# Patient Record
Sex: Male | Born: 1950 | Race: White | Hispanic: No | Marital: Married | Smoking: Never smoker
Health system: Southern US, Community
[De-identification: ages and names within clinical notes are randomized; demographics above are authoritative.]

## PROBLEM LIST (undated history)

## (undated) DIAGNOSIS — Z85828 Personal history of other malignant neoplasm of skin: Secondary | ICD-10-CM

## (undated) DIAGNOSIS — I10 Essential (primary) hypertension: Secondary | ICD-10-CM

## (undated) DIAGNOSIS — C801 Malignant (primary) neoplasm, unspecified: Secondary | ICD-10-CM

## (undated) DIAGNOSIS — G500 Trigeminal neuralgia: Secondary | ICD-10-CM

## (undated) DIAGNOSIS — R011 Cardiac murmur, unspecified: Secondary | ICD-10-CM

## (undated) DIAGNOSIS — E7800 Pure hypercholesterolemia, unspecified: Secondary | ICD-10-CM

## (undated) DIAGNOSIS — I499 Cardiac arrhythmia, unspecified: Secondary | ICD-10-CM

## (undated) DIAGNOSIS — I4891 Unspecified atrial fibrillation: Secondary | ICD-10-CM

## (undated) HISTORY — DX: Essential (primary) hypertension: I10

## (undated) HISTORY — PX: SKIN CANCER EXCISION: SHX779

## (undated) HISTORY — DX: Pure hypercholesterolemia, unspecified: E78.00

## (undated) HISTORY — DX: Trigeminal neuralgia: G50.0

## (undated) HISTORY — DX: Personal history of other malignant neoplasm of skin: Z85.828

## (undated) HISTORY — PX: CATARACT EXTRACTION W/ INTRAOCULAR LENS IMPLANT: SHX1309

---

## 1995-03-07 HISTORY — PX: RETINAL DETACHMENT SURGERY: SHX105

## 2008-02-26 ENCOUNTER — Encounter: Admission: RE | Admit: 2008-02-26 | Discharge: 2008-02-26 | Payer: Self-pay | Admitting: Sports Medicine

## 2011-08-23 ENCOUNTER — Encounter (INDEPENDENT_AMBULATORY_CARE_PROVIDER_SITE_OTHER): Payer: Self-pay | Admitting: General Surgery

## 2011-09-20 ENCOUNTER — Encounter (INDEPENDENT_AMBULATORY_CARE_PROVIDER_SITE_OTHER): Payer: Self-pay | Admitting: General Surgery

## 2011-09-20 ENCOUNTER — Ambulatory Visit (INDEPENDENT_AMBULATORY_CARE_PROVIDER_SITE_OTHER): Payer: 59 | Admitting: General Surgery

## 2011-09-20 VITALS — BP 128/82 | HR 60 | Temp 98.4°F | Resp 14 | Ht 74.0 in | Wt 169.0 lb

## 2011-09-20 DIAGNOSIS — K4090 Unilateral inguinal hernia, without obstruction or gangrene, not specified as recurrent: Secondary | ICD-10-CM

## 2011-09-20 NOTE — Progress Notes (Signed)
Patient ID: James Ibarra, male   DOB: 14-Feb-1951, 61 y.o.   MRN: [id number]  Chief Complaint  Patient presents with  . Inguinal Hernia    left    HPI James Ibarra is a 61 y.o. male.   HPI 61 year old Caucasian male referred by Dr. Wynelle Link for evaluation of a left inguinal hernia. The patient states that he started having some mild discomfort in his left groin area about 2 months ago. He states the area is more bothersome toward the end of the day after he's been standing. He also noticed a bulge later in the afternoon. He denies any burning or stinging pain. He denies any fever, chills, nausea, vomiting, diarrhea or constipation. He states the area just causes a mild ache or discomfort.  Past Medical History  Diagnosis Date  . Hypertension   . Trigeminal neuralgia   . Hypercholesteremia   . History of nonmelanoma skin cancer     Past Surgical History  Procedure Date  . Skin cancer excision 2012, 1999    basal cell - neck and hand  . Retinal detachment surgery 1997    Family History  Problem Relation Age of Onset  . Heart disease Father   . Heart disease Mother     Social History History  Substance Use Topics  . Smoking status: Never Smoker   . Smokeless tobacco: Not on file  . Alcohol Use: Yes     3 glasses of wine a day    No Known Allergies  Current Outpatient Prescriptions  Medication Sig Dispense Refill  . sildenafil (REVATIO) 20 MG tablet Take 20 mg by mouth as needed.      Marland Kitchen amLODipine (NORVASC) 5 MG tablet Take 5 mg by mouth daily.      Marland Kitchen olmesartan-hydrochlorothiazide (BENICAR HCT) 40-25 MG per tablet Take 1 tablet by mouth daily.      Marland Kitchen zolpidem (AMBIEN CR) 6.25 MG CR tablet Take 6.25 mg by mouth at bedtime as needed.        Review of Systems Review of Systems  Constitutional: Negative for fever, chills, appetite change and unexpected weight change.  HENT: Negative for congestion and trouble swallowing.   Eyes: Negative for visual disturbance.    Respiratory: Negative for chest tightness and shortness of breath.   Cardiovascular: Negative for chest pain and leg swelling.       No PND, no orthopnea, no DOE  Gastrointestinal:       See HPI  Genitourinary: Negative for dysuria, urgency, hematuria, decreased urine volume and difficulty urinating.  Musculoskeletal: Negative.   Skin: Negative for rash.  Neurological: Negative for seizures and speech difficulty.  Hematological: Does not bruise/bleed easily.  Psychiatric/Behavioral: Negative for behavioral problems and confusion.    Blood pressure 128/82, pulse 60, temperature 98.4 F (36.9 C), temperature source Temporal, resp. rate 14, height 6\' 2"  (1.88 James Ibarra), weight 169 lb (76.658 kg).  Physical Exam Physical Exam  Vitals reviewed. Constitutional: He is oriented to person, place, and time. He appears well-developed and well-nourished. No distress.  HENT:  Head: Normocephalic and atraumatic.  Right Ear: External ear normal.  Left Ear: External ear normal.  Eyes: Conjunctivae are normal. No scleral icterus.  Neck: Normal range of motion. Neck supple. No tracheal deviation present. No thyromegaly present.  Cardiovascular: Normal rate, regular rhythm and normal heart sounds.   Pulmonary/Chest: Effort normal and breath sounds normal. No respiratory distress. He has no wheezes.  Abdominal: Soft. Bowel sounds are normal. He exhibits no  distension. There is no tenderness. A hernia is present. Hernia confirmed positive in the left inguinal area. Hernia confirmed negative in the right inguinal area.  Genitourinary: Testes normal and penis normal.          Reducible LIH  Musculoskeletal: Normal range of motion. He exhibits no edema and no tenderness.  Neurological: He is alert and oriented to person, place, and time. He exhibits normal muscle tone.  Skin: Skin is warm and dry. He is not diaphoretic.  Psychiatric: He has a normal mood and affect. His behavior is normal. Judgment and  thought content normal.    Data Reviewed Dr Chase Caller note from 6/14  Assessment    Left inguinal hernia    Plan    We discussed the etiology of inguinal hernias. We discussed the signs & symptoms of incarceration & strangulation.  We discussed non-operative and operative management. We discussed open as well as laparoscopic repairs. We discussed the pros and cons of each.  The patient has elected to proceed with LAPAROSCOPIC REPAIR OF LEFT INGUINAL HERNIA WITH MESH    I described the procedure in detail.  The patient was given educational material. We discussed the risks and benefits including but not limited to bleeding, infection, chronic inguinal pain, nerve entrapment, hernia recurrence, mesh complications, hematoma formation, urinary retention, injury to the testicles, numbness in the groin, blood clots, injury to the surrounding structures, and anesthesia risk. We also discussed the typical post operative recovery course, including no heavy lifting for 4-6 weeks. I explained that the likelihood of improvement of their symptoms is good.  All of his questions were asked and answered  Mary Sella. Andrey Campanile, MD, FACS General, Bariatric, & Minimally Invasive Surgery [facility], [place]         [facility] James Ibarra 09/20/2011, 1:05 PM

## 2011-10-27 ENCOUNTER — Encounter (HOSPITAL_COMMUNITY): Payer: Self-pay | Admitting: Pharmacy Technician

## 2011-11-03 ENCOUNTER — Encounter (HOSPITAL_COMMUNITY)
Admission: RE | Admit: 2011-11-03 | Discharge: 2011-11-03 | Disposition: A | Discharge status: Home / Self Care | Payer: 59 | Source: Ambulatory Visit | Attending: General Surgery | Admitting: General Surgery

## 2011-11-03 ENCOUNTER — Ambulatory Visit (HOSPITAL_COMMUNITY)
Admission: RE | Admit: 2011-11-03 | Discharge: 2011-11-03 | Disposition: A | Discharge status: Home / Self Care | Payer: 59 | Source: Ambulatory Visit | Attending: General Surgery | Admitting: General Surgery

## 2011-11-03 ENCOUNTER — Encounter (HOSPITAL_COMMUNITY): Payer: Self-pay

## 2011-11-03 DIAGNOSIS — J4489 Other specified chronic obstructive pulmonary disease: Secondary | ICD-10-CM | POA: Insufficient documentation

## 2011-11-03 DIAGNOSIS — Z01810 Encounter for preprocedural cardiovascular examination: Secondary | ICD-10-CM | POA: Insufficient documentation

## 2011-11-03 DIAGNOSIS — Z01812 Encounter for preprocedural laboratory examination: Secondary | ICD-10-CM | POA: Insufficient documentation

## 2011-11-03 DIAGNOSIS — K4090 Unilateral inguinal hernia, without obstruction or gangrene, not specified as recurrent: Secondary | ICD-10-CM | POA: Insufficient documentation

## 2011-11-03 DIAGNOSIS — J449 Chronic obstructive pulmonary disease, unspecified: Secondary | ICD-10-CM | POA: Insufficient documentation

## 2011-11-03 LAB — BASIC METABOLIC PANEL
Calcium: 9.9 mg/dL (ref 8.4–10.5)
GFR calc Af Amer: >90 mL/min (ref >90)
GFR calc non Af Amer: >90 mL/min (ref >90)
Glucose, Bld: 61 mg/dL — ABNORMAL LOW (ref 70–99)
Sodium: 140 mEq/L (ref 135–145)

## 2011-11-03 LAB — CBC WITH DIFFERENTIAL/PLATELET
Basophils Absolute: 0 10*3/uL (ref 0.0–0.1)
Basophils Relative: 0 % (ref 0–1)
Eosinophils Absolute: 0.1 10*3/uL (ref 0.0–0.7)
Eosinophils Relative: 1 % (ref 0–5)
Lymphs Abs: 1.7 10*3/uL (ref 0.7–4.0)
MCH: 31 pg (ref 26.0–34.0)
MCHC: 34.5 g/dL (ref 30.0–36.0)
MCV: 89.9 fL (ref 78.0–100.0)
Neutrophils Relative %: 53 % (ref 43–77)
Platelets: 241 10*3/uL (ref 150–400)
RDW: 13.3 % (ref 11.5–15.5)

## 2011-11-03 LAB — SURGICAL PCR SCREEN: Staphylococcus aureus: POSITIVE — AB

## 2011-11-03 NOTE — Patient Instructions (Addendum)
20 REFUGIO VANDEVOORDE  11/03/2011   Your procedure is scheduled on:  11-07-2011   Report to [facility] at 0530  AM.  Call this number if you have problems the morning of surgery: [phone]   Remember:wife holli driver and to be with you 24 hours after surgery cell [phone]   Do not eat food or drink liquids:After Midnight.  .  Take these medicines the morning of surgery with A SIP OF WATER: amlodipine   Do not wear jewelry or make up.  Do not wear lotions, powders, or perfumes.Do not wear deodorant.    Do not bring valuables to the hospital.  Contacts, dentures or bridgework may not be worn into surgery.  Leave suitcase in the car. After surgery it may be brought to your room.  For patients admitted to the hospital, checkout time is 11:00 AM the day of discharge                             Patients discharged the day of surgery will not be allowed to drive home. If going home same day of surgery, you must have someone stay with you the first 24 hours at home and arrange for some one to drive you home from hospital.    Special Instructions: CHG Shower Use Special Wash: 1/2 bottle night before surgery and 1/2 bottle morning  of surgery, use regular soap on face and front and back private area. Women do not shave legs or underarms for 2 days before showers. Men may shave face morning of surgery.    Please read over the following fact sheets that you were given: MRSA Information  Cain S[facility] pre op nurse phone number [phone], call if needed

## 2011-11-03 NOTE — Progress Notes (Addendum)
bmet results routed to dr Andrey Campanile inbox, pt stated by phone feeling well, ate only small meal prior to pre op this am Routed chest xray results to dr Andrey Campanile inbox

## 2011-11-07 ENCOUNTER — Ambulatory Visit (HOSPITAL_COMMUNITY)
Admission: RE | Admit: 2011-11-07 | Discharge: 2011-11-07 | Disposition: A | Discharge status: Home / Self Care | Payer: 59 | Source: Ambulatory Visit | Attending: General Surgery | Admitting: General Surgery

## 2011-11-07 ENCOUNTER — Encounter (HOSPITAL_COMMUNITY): Payer: Self-pay | Admitting: *Deleted

## 2011-11-07 ENCOUNTER — Encounter (INDEPENDENT_AMBULATORY_CARE_PROVIDER_SITE_OTHER): Payer: Self-pay | Admitting: General Surgery

## 2011-11-07 ENCOUNTER — Ambulatory Visit (HOSPITAL_COMMUNITY): Payer: 59 | Admitting: *Deleted

## 2011-11-07 ENCOUNTER — Encounter (HOSPITAL_COMMUNITY)
Admission: RE | Disposition: A | Discharge status: Home / Self Care | Payer: Self-pay | Source: Ambulatory Visit | Attending: General Surgery

## 2011-11-07 DIAGNOSIS — E7800 Pure hypercholesterolemia, unspecified: Secondary | ICD-10-CM | POA: Insufficient documentation

## 2011-11-07 DIAGNOSIS — K4090 Unilateral inguinal hernia, without obstruction or gangrene, not specified as recurrent: Secondary | ICD-10-CM | POA: Insufficient documentation

## 2011-11-07 DIAGNOSIS — I10 Essential (primary) hypertension: Secondary | ICD-10-CM | POA: Insufficient documentation

## 2011-11-07 DIAGNOSIS — R911 Solitary pulmonary nodule: Secondary | ICD-10-CM | POA: Insufficient documentation

## 2011-11-07 DIAGNOSIS — Z79899 Other long term (current) drug therapy: Secondary | ICD-10-CM | POA: Insufficient documentation

## 2011-11-07 DIAGNOSIS — Z85828 Personal history of other malignant neoplasm of skin: Secondary | ICD-10-CM | POA: Insufficient documentation

## 2011-11-07 HISTORY — PX: HERNIA REPAIR: SHX51

## 2011-11-07 HISTORY — PX: INGUINAL HERNIA REPAIR: SHX194

## 2011-11-07 SURGERY — REPAIR, HERNIA, INGUINAL, LAPAROSCOPIC
Anesthesia: General | Site: Pelvis | Laterality: Left | Wound class: Clean

## 2011-11-07 MED ORDER — GLYCOPYRROLATE 0.2 MG/ML IJ SOLN
INTRAMUSCULAR | Status: DC | PRN
  Administered 2011-11-07: 0.6 mg via INTRAVENOUS

## 2011-11-07 MED ORDER — BUPIVACAINE-EPINEPHRINE PF 0.25-1:200000 % IJ SOLN
INTRAMUSCULAR | Status: AC
  Filled 2011-11-07: qty 30

## 2011-11-07 MED ORDER — MORPHINE SULFATE 10 MG/ML IJ SOLN: 1.0000 mg | INTRAMUSCULAR | Status: DC | PRN

## 2011-11-07 MED ORDER — FENTANYL CITRATE 0.05 MG/ML IJ SOLN: 25.0000 ug | INTRAMUSCULAR | Status: DC | PRN

## 2011-11-07 MED ORDER — ONDANSETRON HCL 4 MG/2ML IJ SOLN: 4.0000 mg | Freq: Four times a day (QID) | INTRAMUSCULAR | Status: DC | PRN

## 2011-11-07 MED ORDER — OXYCODONE HCL 5 MG PO TABS
5.0000 mg | ORAL_TABLET | ORAL | Status: DC | PRN
  Administered 2011-11-07: 5 mg via ORAL

## 2011-11-07 MED ORDER — ONDANSETRON HCL 4 MG/2ML IJ SOLN
INTRAMUSCULAR | Status: DC | PRN
  Administered 2011-11-07: 4 mg via INTRAVENOUS

## 2011-11-07 MED ORDER — 0.9 % SODIUM CHLORIDE (POUR BTL) OPTIME
TOPICAL | Status: DC | PRN
  Administered 2011-11-07: 1000 mL

## 2011-11-07 MED ORDER — ACETAMINOPHEN 10 MG/ML IV SOLN
INTRAVENOUS | Status: DC | PRN
  Administered 2011-11-07: 1000 mg via INTRAVENOUS

## 2011-11-07 MED ORDER — KETOROLAC TROMETHAMINE 30 MG/ML IJ SOLN
15.0000 mg | Freq: Once | INTRAMUSCULAR | Status: AC | PRN
  Administered 2011-11-07: 30 mg via INTRAVENOUS

## 2011-11-07 MED ORDER — EPHEDRINE SULFATE 50 MG/ML IJ SOLN
INTRAMUSCULAR | Status: DC | PRN
  Administered 2011-11-07 (×3): 5 mg via INTRAVENOUS
  Administered 2011-11-07 (×2): 10 mg via INTRAVENOUS

## 2011-11-07 MED ORDER — HYDROMORPHONE HCL PF 1 MG/ML IJ SOLN
INTRAMUSCULAR | Status: DC | PRN
  Administered 2011-11-07 (×2): 1 mg via INTRAVENOUS

## 2011-11-07 MED ORDER — CEFAZOLIN SODIUM-DEXTROSE 2-3 GM-% IV SOLR
2.0000 g | INTRAVENOUS | Status: AC
  Administered 2011-11-07: 2 g via INTRAVENOUS

## 2011-11-07 MED ORDER — MIDAZOLAM HCL 5 MG/5ML IJ SOLN
INTRAMUSCULAR | Status: DC | PRN
  Administered 2011-11-07 (×2): 1 mg via INTRAVENOUS

## 2011-11-07 MED ORDER — SODIUM CHLORIDE 0.9 % IJ SOLN: 3.0000 mL | INTRAMUSCULAR | Status: DC | PRN

## 2011-11-07 MED ORDER — KETAMINE HCL 10 MG/ML IJ SOLN
INTRAMUSCULAR | Status: DC | PRN
  Administered 2011-11-07 (×5): 1 mg via INTRAVENOUS
  Administered 2011-11-07: 25 mg via INTRAVENOUS

## 2011-11-07 MED ORDER — LACTATED RINGERS IV SOLN
INTRAVENOUS | Status: DC | PRN
  Administered 2011-11-07: 1000 mL

## 2011-11-07 MED ORDER — LACTATED RINGERS IV SOLN
INTRAVENOUS | Status: DC | PRN
  Administered 2011-11-07 (×2): via INTRAVENOUS

## 2011-11-07 MED ORDER — METOCLOPRAMIDE HCL 5 MG/ML IJ SOLN
INTRAMUSCULAR | Status: DC | PRN
  Administered 2011-11-07: 10 mg via INTRAVENOUS

## 2011-11-07 MED ORDER — DEXAMETHASONE SODIUM PHOSPHATE 4 MG/ML IJ SOLN
INTRAMUSCULAR | Status: DC | PRN
  Administered 2011-11-07: 10 mg via INTRAVENOUS

## 2011-11-07 MED ORDER — CEFAZOLIN SODIUM-DEXTROSE 2-3 GM-% IV SOLR
INTRAVENOUS | Status: AC
Start: 2011-11-07 — End: 2011-11-07
  Filled 2011-11-07: qty 50

## 2011-11-07 MED ORDER — PROMETHAZINE HCL 25 MG/ML IJ SOLN: 6.2500 mg | INTRAMUSCULAR | Status: DC | PRN

## 2011-11-07 MED ORDER — SODIUM CHLORIDE 0.9 % IJ SOLN: 3.0000 mL | Freq: Two times a day (BID) | INTRAMUSCULAR | Status: DC

## 2011-11-07 MED ORDER — OXYCODONE-ACETAMINOPHEN 5-325 MG PO TABS: 1.0000 | ORAL_TABLET | ORAL | Status: AC | PRN

## 2011-11-07 MED ORDER — KETOROLAC TROMETHAMINE 30 MG/ML IJ SOLN
INTRAMUSCULAR | Status: AC
  Filled 2011-11-07: qty 1

## 2011-11-07 MED ORDER — ROCURONIUM BROMIDE 100 MG/10ML IV SOLN
INTRAVENOUS | Status: DC | PRN
  Administered 2011-11-07: 50 mg via INTRAVENOUS

## 2011-11-07 MED ORDER — OXYCODONE HCL 5 MG PO TABS
ORAL_TABLET | ORAL | Status: AC
  Administered 2011-11-07: 5 mg via ORAL
  Filled 2011-11-07: qty 1

## 2011-11-07 MED ORDER — SODIUM CHLORIDE 0.9 % IV SOLN: 250.0000 mL | INTRAVENOUS | Status: DC | PRN

## 2011-11-07 MED ORDER — BUPIVACAINE-EPINEPHRINE 0.25% -1:200000 IJ SOLN
INTRAMUSCULAR | Status: DC | PRN
  Administered 2011-11-07: 30 mL

## 2011-11-07 MED ORDER — ACETAMINOPHEN 325 MG PO TABS: 650.0000 mg | ORAL_TABLET | ORAL | Status: DC | PRN

## 2011-11-07 MED ORDER — BUPIVACAINE-EPINEPHRINE PF 0.25-1:200000 % IJ SOLN
INTRAMUSCULAR | Status: AC
Start: 2011-11-07 — End: 2011-11-07
  Filled 2011-11-07: qty 30

## 2011-11-07 MED ORDER — ACETAMINOPHEN 650 MG RE SUPP
650.0000 mg | RECTAL | Status: DC | PRN
  Filled 2011-11-07: qty 1

## 2011-11-07 MED ORDER — FENTANYL CITRATE 0.05 MG/ML IJ SOLN
INTRAMUSCULAR | Status: DC | PRN
  Administered 2011-11-07 (×2): 50 ug via INTRAVENOUS

## 2011-11-07 MED ORDER — NEOSTIGMINE METHYLSULFATE 1 MG/ML IJ SOLN
INTRAMUSCULAR | Status: DC | PRN
  Administered 2011-11-07: 5 mg via INTRAVENOUS

## 2011-11-07 MED ORDER — ACETAMINOPHEN 10 MG/ML IV SOLN
INTRAVENOUS | Status: AC
  Filled 2011-11-07: qty 100

## 2011-11-07 MED ORDER — PROPOFOL 10 MG/ML IV BOLUS
INTRAVENOUS | Status: DC | PRN
  Administered 2011-11-07: 150 mg via INTRAVENOUS

## 2011-11-07 SURGICAL SUPPLY — 41 items
ADH SKN CLS APL DERMABOND .7 (GAUZE/BANDAGES/DRESSINGS) ×1
APL SKNCLS STERI-STRIP NONHPOA (GAUZE/BANDAGES/DRESSINGS)
BANDAGE ADHESIVE 1X3 (GAUZE/BANDAGES/DRESSINGS) IMPLANT
BENZOIN TINCTURE PRP APPL 2/3 (GAUZE/BANDAGES/DRESSINGS) IMPLANT
CANISTER SUCTION 2500CC (MISCELLANEOUS) ×1 IMPLANT
CLOTH BEACON ORANGE TIMEOUT ST (SAFETY) ×2 IMPLANT
DECANTER SPIKE VIAL GLASS SM (MISCELLANEOUS) ×2 IMPLANT
DERMABOND ADVANCED (GAUZE/BANDAGES/DRESSINGS) ×1
DERMABOND ADVANCED .7 DNX12 (GAUZE/BANDAGES/DRESSINGS) IMPLANT
DEVICE SECURE STRAP 25 ABSORB (INSTRUMENTS) ×2 IMPLANT
DRAPE LAPAROSCOPIC ABDOMINAL (DRAPES) ×2 IMPLANT
DRAPE UTILITY XL STRL (DRAPES) ×2 IMPLANT
DRSG TEGADERM 2-3/8X2-3/4 SM (GAUZE/BANDAGES/DRESSINGS) IMPLANT
DRSG TEGADERM 4X4.75 (GAUZE/BANDAGES/DRESSINGS) IMPLANT
ELECT REM PT RETURN 9FT ADLT (ELECTROSURGICAL) ×2
ELECTRODE REM PT RTRN 9FT ADLT (ELECTROSURGICAL) ×1 IMPLANT
GLOVE BIO SURGEON STRL SZ7.5 (GLOVE) ×2 IMPLANT
GLOVE BIOGEL M STRL SZ7.5 (GLOVE) IMPLANT
GLOVE BIOGEL PI IND STRL 7.5 (GLOVE) IMPLANT
GLOVE BIOGEL PI INDICATOR 7.5 (GLOVE) ×1
GLOVE ECLIPSE 7.0 STRL STRAW (GLOVE) ×1 IMPLANT
GLOVE INDICATOR 8.0 STRL GRN (GLOVE) ×2 IMPLANT
GLOVE SURG SS PI 6.5 STRL IVOR (GLOVE) ×2 IMPLANT
GLOVE SURG SS PI 7.0 STRL IVOR (GLOVE) ×1 IMPLANT
GOWN STRL NON-REIN LRG LVL3 (GOWN DISPOSABLE) ×2 IMPLANT
GOWN STRL REIN XL XLG (GOWN DISPOSABLE) ×4 IMPLANT
KIT BASIN OR (CUSTOM PROCEDURE TRAY) ×2 IMPLANT
MESH ULTRAPRO 3X6 7.6X15CM (Mesh General) ×1 IMPLANT
NS IRRIG 1000ML POUR BTL (IV SOLUTION) ×2 IMPLANT
SCISSORS LAP 5X35 DISP (ENDOMECHANICALS) ×1 IMPLANT
SET IRRIG TUBING LAPAROSCOPIC (IRRIGATION / IRRIGATOR) ×1 IMPLANT
SHEARS CURVED HARMONIC AC 45CM (MISCELLANEOUS) IMPLANT
SOLUTION ANTI FOG 6CC (MISCELLANEOUS) ×2 IMPLANT
STRIP CLOSURE SKIN 1/2X4 (GAUZE/BANDAGES/DRESSINGS) IMPLANT
SUT MNCRL AB 4-0 PS2 18 (SUTURE) ×2 IMPLANT
SUT VICRYL 0 UR6 27IN ABS (SUTURE) ×1 IMPLANT
TOWEL OR 17X26 10 PK STRL BLUE (TOWEL DISPOSABLE) ×2 IMPLANT
TRAY LAP CHOLE (CUSTOM PROCEDURE TRAY) ×2 IMPLANT
TROCAR BLADELESS OPT 5 75 (ENDOMECHANICALS) ×4 IMPLANT
TROCAR XCEL BLUNT TIP 100MML (ENDOMECHANICALS) ×2 IMPLANT
TUBING INSUFFLATION 10FT LAP (TUBING) ×2 IMPLANT

## 2011-11-07 NOTE — H&P (Signed)
James Ibarra is an 61 y.o. male.   Chief Complaint: here for surgery HPI: 61 year old Caucasian male referred by Dr. Wynelle Link for evaluation of a left inguinal hernia. The patient states that he started having some mild discomfort in his left groin area about 2 months ago. He states the area is more bothersome toward the end of the day after he's been standing. He also noticed a bulge later in the afternoon. He denies any burning or stinging pain. He denies any fever, chills, nausea, vomiting, diarrhea or constipation. He states the area just causes a mild ache or discomfort.  Since seen in office - reports increased discomfort.   Past Medical History  Diagnosis Date  . Hypertension   . Hypercholesteremia   . History of nonmelanoma skin cancer   . Trigeminal neuralgia     Past Surgical History  Procedure Date  . Skin cancer excision 2012, 1999    basal cell - neck and hand  . Retinal detachment surgery 1997    Family History  Problem Relation Age of Onset  . Heart disease Father   . Heart disease Mother    Social History:  reports that he has never smoked. He has never used smokeless tobacco. He reports that he drinks alcohol. He reports that he does not use illicit drugs.  Allergies: No Known Allergies  Medications Prior to Admission  Medication Sig Dispense Refill  . amLODipine (NORVASC) 5 MG tablet Take 5 mg by mouth daily before breakfast.       . Multiple Vitamin (MULTIVITAMIN WITH MINERALS) TABS Take 1 tablet by mouth daily.      Marland Kitchen olmesartan-hydrochlorothiazide (BENICAR HCT) 40-25 MG per tablet Take 1 tablet by mouth daily before breakfast.       . POTASSIUM GLUCONATE PO Take 1 tablet by mouth daily.      Marland Kitchen zolpidem (AMBIEN CR) 6.25 MG CR tablet Take 12.5 mg by mouth at bedtime as needed. Sleep      . sildenafil (VIAGRA) 100 MG tablet Take 100 mg by mouth daily as needed. Erectile dysfunction        No results found for this or any previous visit (from the past 48  hour(s)). No results found.  Review of Systems  Constitutional: Negative for fever and chills.  Respiratory: Negative for shortness of breath.   Cardiovascular: Negative for chest pain, leg swelling and PND.  Gastrointestinal: Negative for nausea and vomiting.  Neurological: Negative for seizures and loss of consciousness.  All other systems reviewed and are negative.    Blood pressure 126/85, pulse 67, temperature 97.2 F (36.2 C), resp. rate 20, SpO2 98.00%. Physical Exam  Vitals reviewed. Constitutional: He is oriented to person, place, and time. He appears well-developed and well-nourished. No distress.  HENT:  Head: Normocephalic and atraumatic.  Right Ear: External ear normal.  Left Ear: External ear normal.  Eyes: Conjunctivae are normal.  Neck: Neck supple. No tracheal deviation present.  Cardiovascular: Normal rate and intact distal pulses.   Respiratory: Effort normal. No stridor. He has no wheezes.  GI: Soft. Bowel sounds are normal. He exhibits no distension.  Musculoskeletal: He exhibits no edema.  Neurological: He is alert and oriented to person, place, and time.  Skin: Skin is warm and dry. He is not diaphoretic.  Psychiatric: He has a normal mood and affect. His behavior is normal. Judgment and thought content normal.     Assessment/Plan Left inguinal hernia  To OR for repair - all questions asked  and answered Mary Sella. Andrey Campanile, MD, FACS General, Bariatric, & Minimally Invasive Surgery [facility], [place]   [facility] M 11/07/2011, 7:25 AM

## 2011-11-07 NOTE — Anesthesia Postprocedure Evaluation (Signed)
  Anesthesia Post-op Note  Patient: James Ibarra  Procedure(s) Performed: Procedure(s) (LRB): LAPAROSCOPIC INGUINAL HERNIA (Left) INSERTION OF MESH (Left)  Patient Location: PACU  Anesthesia Type: General  Level of Consciousness: awake and alert   Airway and Oxygen Therapy: Patient Spontanous Breathing  Post-op Pain: mild  Post-op Assessment: Post-op Vital signs reviewed, Patient's Cardiovascular Status Stable, Respiratory Function Stable, Patent Airway and No signs of Nausea or vomiting  Post-op Vital Signs: stable  Complications: No apparent anesthesia complications

## 2011-11-07 NOTE — Transfer of Care (Signed)
Immediate Anesthesia Transfer of Care Note  Patient: James Ibarra  Procedure(s) Performed: Procedure(s) (LRB): LAPAROSCOPIC INGUINAL HERNIA (Left) INSERTION OF MESH (Left)  Patient Location: PACU  Anesthesia Type: General  Level of Consciousness: awake, alert , patient cooperative and responds to stimulation  Airway & Oxygen Therapy: Patient Spontanous Breathing and Patient connected to face mask oxygen  Post-op Assessment: Report given to PACU RN, Post -op Vital signs reviewed and stable and Patient moving all extremities  Post vital signs: Reviewed and stable  Complications: No apparent anesthesia complications

## 2011-11-07 NOTE — Preoperative (Signed)
Beta Blockers   Reason not to administer Beta Blockers:Not Applicable, NOT ON HOME BB

## 2011-11-07 NOTE — Anesthesia Preprocedure Evaluation (Signed)
Anesthesia Evaluation  Patient identified by MRN, date of birth, ID band Patient awake    Reviewed: Allergy & Precautions, H&P , NPO status , Patient's Chart, lab work & pertinent test results  Airway Mallampati: II TM Distance: >3 FB Neck ROM: Full    Dental No notable dental hx.    Pulmonary neg pulmonary ROS,  breath sounds clear to auscultation  Pulmonary exam normal       Cardiovascular hypertension, Pt. on medications Rhythm:Regular Rate:Normal     Neuro/Psych negative neurological ROS  negative psych ROS   GI/Hepatic negative GI ROS, Neg liver ROS,   Endo/Other  negative endocrine ROS  Renal/GU negative Renal ROS  negative genitourinary   Musculoskeletal negative musculoskeletal ROS (+)   Abdominal   Peds negative pediatric ROS (+)  Hematology negative hematology ROS (+)   Anesthesia Other Findings   Reproductive/Obstetrics negative OB ROS                           Anesthesia Physical Anesthesia Plan  ASA: II  Anesthesia Plan: General   Post-op Pain Management:    Induction: Intravenous  Airway Management Planned: Oral ETT  Additional Equipment:   Intra-op Plan:   Post-operative Plan: Extubation in OR  Informed Consent: I have reviewed the patients History and Physical, chart, labs and discussed the procedure including the risks, benefits and alternatives for the proposed anesthesia with the patient or authorized representative who has indicated his/her understanding and acceptance.   Dental advisory given  Plan Discussed with: CRNA and Surgeon  Anesthesia Plan Comments:         Anesthesia Quick Evaluation  

## 2011-11-07 NOTE — Anesthesia Procedure Notes (Signed)
Procedure Name: Intubation Date/Time: 11/07/2011 7:38 AM Performed by: Randon Goldsmith CATHERINE PAYNE Pre-anesthesia Checklist: Patient identified, Suction available, Emergency Drugs available and Patient being monitored Patient Re-evaluated:Patient Re-evaluated prior to inductionOxygen Delivery Method: Circle system utilized Preoxygenation: Pre-oxygenation with 100% oxygen Intubation Type: IV induction Ventilation: Mask ventilation without difficulty Laryngoscope Size: Mac and 4 Grade View: Grade I Tube type: Oral Tube size: 7.5 mm Number of attempts: 1 Airway Equipment and Method: Stylet Placement Confirmation: ETT inserted through vocal cords under direct vision,  positive ETCO2 and CO2 detector Secured at: 22 cm Tube secured with: Tape Dental Injury: Teeth and Oropharynx as per pre-operative assessment

## 2011-11-07 NOTE — Op Note (Signed)
11/07/2011  James Ibarra 25-Feb-1960   PREOPERATIVE DIAGNOSIS: left inguinal hernia.   POSTOPERATIVE DIAGNOSIS: left indirect inguinal hernia.   PROCEDURE: Laparoscopic repair of left indirect inguinal hernia with  mesh (TAPP).   SURGEON: Mary Sella. Andrey Campanile, MD, FACS  ASSISTANT SURGEON: None.   ANESTHESIA: General plus local consisting of 0.25% Marcaine with epi.   ESTIMATED BLOOD LOSS: Minimal.   FINDINGS: The patient had a left indirect hernia.  It was repaired using a 3 inch x 6  inch piece of Ethicon UltraPro mesh.   SPECIMEN: none  INDICATIONS FOR PROCEDURE: 61 yo WM with a symptomatic left inguinal hernia presents for repair. The risks and benefits including but not limited to bleeding, infection, chronic inguinal pain, nerve entrapment, hernia recurrence, mesh complications, hematoma formation, urinary retention, injury to the testicles or the ovaries, numbness in the groin, blood clots, injury to the surrounding structures, and anesthesia risk was discussed with the patient.  DESCRIPTION OF PROCEDURE: After obtaining verbal consent and marking  the left groin in the holding area with the patient confirming the  operative site, the patient was then taken back to the operating room, placed  supine on the operating room table. General endotracheal anesthesia was  established. The patient had emptied their bladder prior to going back to  the operating room. Sequential compression devices were placed. The  abdomen and groin were prepped and draped in the usual standard surgical  fashion with ChloraPrep. The patient received IV Tylenol as well as IV  antibiotics prior to the incision. A surgical time-out was performed.  Local was infiltrated at the base of the umbilicus.   Next, a 1-cm vertical infraumbilical incision was made with a #11 blade. The fascia  was grasped and lifted anteriorly. Next, the fascia was incised, and  the abdominal cavity was entered. Pursestring  suture was placed around  the fascial edges using a 0 Vicryl. A 12-mm Hasson trocar was placed.  Pneumoperitoneum was smoothly established up to a patient pressure of 15  mmHg. Laparoscope was advanced. There was no evidence of a  contralateral hernia. The patient had a defect lateral to  the inferior epigastric vessel, consistent with an left indirect  hernia. Two 5-mm trocars were placed, one on the right, one on the left  in the midclavicular line slightly above the level of the umbilicus all  under direct visualization. After local had been infiltrated, I then  made incision along the peritoneum on the left, starting 2 inches above  the anterior superior iliac spine and caring it medial  toward the median umbilical ligament in a lazy S configuration using  Endo Shears with electrocautery. The peritoneal flap was then gently  dissected downward from the anterior abdominal wall taking care not to  injure the inferior epigastric vessels. The pubic bone was identified.  The testicular vessels were identified.  Using  traction and counter traction with short graspers, I reduced the sac in  its entirety. The testicular vessels had been identified and preserved. The vas deferens was identified and preserved, and the hernia sac was stripped from those to  surrounding structures. I then went about creating a large pocket by  lifting the peritoneum of the pelvic floor. I took great care not to  injure the iliac vessels.   Local anesthetic was injected 2 finger breadths below and medial to the anterior superior iliac spine as well as along the left groin prior to placing the mesh. I then obtained a piece of  Ethicon UltraPro mesh 3 inch x  6 inch, placed it through the Hasson trocar, half of it covered medial  to the inferior epigastric vessels and half of it lateral to the  inferior epigastric vessels. The defect was well  covered with the mesh. I then secured the mesh to the abdominal wall    using an Ethicon secure strap tack. Tacks were placed through  the Cooper's ligament, one tack on each side of the inferior epigastric  vessel and two tacks out laterally. No tacks were placed below the  shelving edge of the inguinal ligament. Pneumoperitoneum was reduced  to 8 mmHg. I then brought the peritoneal flap back up to the abdominal  wall and tacked it to the abdominal wall using 4 tacks. There was no  defect in the peritoneum, and the mesh was well covered. I removed the  Hasson trocar and tied down the previously placed pursestring suture.  The closure was viewed laparoscopically. There was no evidence of  fascial defect. There was a small air leak at the umbilicus. Therefore, I placed an additional 0-vicryl figure of eight suture at the umbilical fascia. There was no air leak. There was no  evidence of injury to surrounding structures. Pneumoperitoneum was  released, and the remaining trocars were removed. All skin incisions  were closed with a 4-0 Monocryl in a subcuticular fashion followed by  application of Dermabond. All needle, instrument, and sponge counts  were correct x2. There are no immediate complications. The patient  tolerated the procedure well. The patient was extubated and taken to the  recovery room in stable condition.  Mary Sella. Andrey Campanile, MD, FACS General, Bariatric, & Minimally Invasive Surgery [facility], [place]

## 2011-11-08 ENCOUNTER — Encounter (HOSPITAL_COMMUNITY): Payer: Self-pay | Admitting: General Surgery

## 2011-11-14 ENCOUNTER — Telehealth (INDEPENDENT_AMBULATORY_CARE_PROVIDER_SITE_OTHER): Payer: Self-pay

## 2011-11-14 NOTE — Telephone Encounter (Signed)
Pt called requesting if it would be appropriate to have a refill on narcotics. 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 Pt advised he should not take narcotics and drive or work. Pt states he is improving and  he understands. Pt will take advil or aleve and call with concerns or if meds not controlling discomfort.

## 2011-11-21 ENCOUNTER — Encounter (INDEPENDENT_AMBULATORY_CARE_PROVIDER_SITE_OTHER): Payer: Self-pay | Admitting: General Surgery

## 2011-11-21 ENCOUNTER — Ambulatory Visit (INDEPENDENT_AMBULATORY_CARE_PROVIDER_SITE_OTHER): Payer: 59 | Admitting: General Surgery

## 2011-11-21 VITALS — BP 122/76 | HR 72 | Temp 98.0°F | Ht 74.0 in | Wt 169.4 lb

## 2011-11-21 DIAGNOSIS — R911 Solitary pulmonary nodule: Secondary | ICD-10-CM

## 2011-11-21 DIAGNOSIS — Z09 Encounter for follow-up examination after completed treatment for conditions other than malignant neoplasm: Secondary | ICD-10-CM

## 2011-11-21 NOTE — Progress Notes (Signed)
Subjective:     Patient ID: James Ibarra, male   DOB: 1950/09/14, 61 y.o.   MRN: [id number]  HPI 61 year old Caucasian male comes in for his first followup appointment after undergoing a laparoscopic repair of a left indirect inguinal hernia with mesh on 11/07/2011. He states that he did well after surgery. He states that he had about a week of pain. He states now he does have some intermittent discomfort in his left groin. He denies any fever, chills, nausea, vomiting, diarrhea. He did get constipated for a few days but now he is regular. He denies any dysuria. He denies any numbness or tingling in his groin. He is no longer taking any narcotics.  Review of Systems     Objective:   Physical Exam BP 122/76  Pulse 72  Temp 98 F (36.7 C) (Temporal)  Ht 6\' 2"  (1.88 m)  Wt 169 lb 6.4 oz (76.839 kg)  BMI 21.75 kg/m2  SpO2 98%  Gen: alert, NAD, non-toxic appearing Abd: soft, nontender, nondistended. Well-healed trocar sites. No cellulitis. No incisional hernia GU: both testicles descended, no scrotal masses, no sign of hernia recurrence Ext: no edema     Assessment:     Status post laparoscopic repair of left indirect inguinal hernia with mesh on September 3    Plan:     I am very happy with how he is doing. We are a little bit over 2 weeks out from surgery. I told him he could do light cardiovascular activity. I reminded him he should not do strenuous activity such as heavy lifting for another 4 weeks. On his preoperative chest x-ray there was a nodular density seen on his chest x-ray. It was unclear if it was nipple shadow. Radiology recommended getting a dedicated frontal chest x-ray with nipple markers. We will order that. The patient has never been a smoker. My suspicion for lung nodule is low. Followup when necessary. Obviously if something is seen on his repeat chest x-ray I will defer this to the management of his primary care physician  Mary Sella. Andrey Campanile, MD, FACS General,  Bariatric, & Minimally Invasive Surgery [facility], [place]

## 2011-11-21 NOTE — Patient Instructions (Signed)
Continue to avoid heavy lifting until after Oct 8. We will order a Chest xray to follow up on potential lung nodule

## 2011-11-22 ENCOUNTER — Other Ambulatory Visit (INDEPENDENT_AMBULATORY_CARE_PROVIDER_SITE_OTHER): Payer: Self-pay | Admitting: General Surgery

## 2011-11-22 ENCOUNTER — Ambulatory Visit
Admission: RE | Admit: 2011-11-22 | Discharge: 2011-11-22 | Disposition: A | Discharge status: Home / Self Care | Payer: 59 | Source: Ambulatory Visit | Attending: General Surgery | Admitting: General Surgery

## 2011-11-22 DIAGNOSIS — R911 Solitary pulmonary nodule: Secondary | ICD-10-CM

## 2011-11-23 ENCOUNTER — Telehealth (INDEPENDENT_AMBULATORY_CARE_PROVIDER_SITE_OTHER): Payer: Self-pay

## 2011-11-23 NOTE — Telephone Encounter (Signed)
Pt returned call. I advised him of his results were benign showing that the area was his nipple not a pulmonary nodule per Dr Andrey Campanile. The pt understands.

## 2011-11-23 NOTE — Telephone Encounter (Signed)
LM at all three numbers for pt to call back so we can tell him his xray is ok in fact it did show his nipple on the xray per Dr Andrey Campanile.

## 2013-04-14 ENCOUNTER — Encounter: Payer: Self-pay | Admitting: Family Medicine

## 2013-04-14 ENCOUNTER — Ambulatory Visit (INDEPENDENT_AMBULATORY_CARE_PROVIDER_SITE_OTHER): Payer: 59 | Admitting: Family Medicine

## 2013-04-14 VITALS — BP 143/90 | HR 73 | Ht 74.0 in

## 2013-04-14 DIAGNOSIS — M25519 Pain in unspecified shoulder: Secondary | ICD-10-CM

## 2013-04-14 MED ORDER — METHYLPREDNISOLONE ACETATE 40 MG/ML IJ SUSP
40.0000 mg | Freq: Once | INTRAMUSCULAR | Status: AC
  Administered 2013-04-14: 40 mg via INTRA_ARTICULAR

## 2013-04-14 NOTE — Progress Notes (Signed)
   Subjective:    Patient ID: James Ibarra, male    DOB: 1950/06/11, 63 y.o.   MRN: [id number]  HPI Right shoulder pain x2-3 weeks. Started after he moves his house, stacking and moving a lot of boxes and furniture. Has been having pain since then particularly with overhead activities. He is right-hand dominant. He's not had any other prior shoulder injury or problems on the right. On the left shoulder he had a similar episode several years ago, and it up having an MRI and then a steroid injection which significantly helped him. He still has his physical therapy handout from that episode.   Review of Systems No upper extremity weakness, no numbness.    Objective:   Physical Exam Vital signs are reviewed general: Well-developed male no acute distress SHOULDER: Bilaterally they're symmetrical. Right shoulder has full range of motion in all planes the rotator cuff nerve he gets significant pain with supraspinatus testing and with overhead extension. Distally he is neurovascularly intact. Luan Pulling is positive. External rotation and internal rotation strength is normal. Supraspinatus strength is limited only by pain. ; ULTRASOUND: Right shoulder or so reveals an intact rotator cuff. 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 INJECTION: Patient was given informed consent, signed copy in the chart. Appropriate time out was taken. Area prepped and draped in usual sterile fashion. One cc of methylprednisolone 40 mg/ml plus  4 cc of 1% lidocaine without epinephrine was injected into the right subacromial bursa using a(n) posterior approach. The patient tolerated the procedure well. There were no complications. Post procedure instructions were given.        Assessment & Plan:  Shoulder pain. Rotator cuff strain/subacromial bursitis. Given his intact rotator cuff are sound in his prior experience with excellent outcome we will give him a corticosteroid injection today and start physical  therapy at home exercise program. He'll return if not improving.

## 2013-04-14 NOTE — Addendum Note (Signed)
Addended by: Arnette Schaumann C on: 04/14/2013 03:56 PM   Modules accepted: Orders

## 2013-10-31 DIAGNOSIS — I10 Essential (primary) hypertension: Secondary | ICD-10-CM | POA: Diagnosis present

## 2014-11-03 ENCOUNTER — Other Ambulatory Visit: Payer: Self-pay | Admitting: Family Medicine

## 2014-11-03 DIAGNOSIS — G500 Trigeminal neuralgia: Secondary | ICD-10-CM

## 2014-11-13 ENCOUNTER — Ambulatory Visit
Admission: RE | Admit: 2014-11-13 | Discharge: 2014-11-13 | Disposition: A | Discharge status: Home / Self Care | Payer: BLUE CROSS/BLUE SHIELD | Source: Ambulatory Visit | Attending: Family Medicine | Admitting: Family Medicine

## 2014-11-13 DIAGNOSIS — G500 Trigeminal neuralgia: Secondary | ICD-10-CM

## 2014-11-13 MED ORDER — GADOBENATE DIMEGLUMINE 529 MG/ML IV SOLN
15.0000 mL | Freq: Once | INTRAVENOUS | Status: AC | PRN
  Administered 2014-11-13: 15 mL via INTRAVENOUS

## 2015-06-29 DIAGNOSIS — H6981 Other specified disorders of Eustachian tube, right ear: Secondary | ICD-10-CM | POA: Diagnosis not present

## 2015-08-11 DIAGNOSIS — G500 Trigeminal neuralgia: Secondary | ICD-10-CM | POA: Diagnosis not present

## 2015-08-11 DIAGNOSIS — N529 Male erectile dysfunction, unspecified: Secondary | ICD-10-CM | POA: Diagnosis not present

## 2015-08-11 DIAGNOSIS — I10 Essential (primary) hypertension: Secondary | ICD-10-CM | POA: Diagnosis not present

## 2015-08-11 DIAGNOSIS — L309 Dermatitis, unspecified: Secondary | ICD-10-CM | POA: Diagnosis not present

## 2015-08-20 DIAGNOSIS — L814 Other melanin hyperpigmentation: Secondary | ICD-10-CM | POA: Diagnosis not present

## 2015-08-20 DIAGNOSIS — L570 Actinic keratosis: Secondary | ICD-10-CM | POA: Diagnosis not present

## 2015-08-20 DIAGNOSIS — Z85828 Personal history of other malignant neoplasm of skin: Secondary | ICD-10-CM | POA: Diagnosis not present

## 2015-08-20 DIAGNOSIS — L218 Other seborrheic dermatitis: Secondary | ICD-10-CM | POA: Diagnosis not present

## 2015-08-20 DIAGNOSIS — L821 Other seborrheic keratosis: Secondary | ICD-10-CM | POA: Diagnosis not present

## 2015-11-17 DIAGNOSIS — L570 Actinic keratosis: Secondary | ICD-10-CM | POA: Diagnosis not present

## 2015-12-29 DIAGNOSIS — H25813 Combined forms of age-related cataract, bilateral: Secondary | ICD-10-CM | POA: Diagnosis not present

## 2015-12-29 DIAGNOSIS — H33052 Total retinal detachment, left eye: Secondary | ICD-10-CM | POA: Diagnosis not present

## 2015-12-29 DIAGNOSIS — D3131 Benign neoplasm of right choroid: Secondary | ICD-10-CM | POA: Diagnosis not present

## 2015-12-29 DIAGNOSIS — H353122 Nonexudative age-related macular degeneration, left eye, intermediate dry stage: Secondary | ICD-10-CM | POA: Diagnosis not present

## 2016-01-03 DIAGNOSIS — Z0000 Encounter for general adult medical examination without abnormal findings: Secondary | ICD-10-CM | POA: Diagnosis not present

## 2016-01-03 DIAGNOSIS — Z23 Encounter for immunization: Secondary | ICD-10-CM | POA: Diagnosis not present

## 2016-01-03 DIAGNOSIS — R634 Abnormal weight loss: Secondary | ICD-10-CM | POA: Diagnosis not present

## 2016-01-03 DIAGNOSIS — E7800 Pure hypercholesterolemia, unspecified: Secondary | ICD-10-CM | POA: Diagnosis not present

## 2016-01-03 DIAGNOSIS — Z125 Encounter for screening for malignant neoplasm of prostate: Secondary | ICD-10-CM | POA: Diagnosis not present

## 2016-01-04 DIAGNOSIS — L570 Actinic keratosis: Secondary | ICD-10-CM | POA: Diagnosis not present

## 2016-01-24 DIAGNOSIS — H25812 Combined forms of age-related cataract, left eye: Secondary | ICD-10-CM | POA: Diagnosis not present

## 2016-01-24 DIAGNOSIS — H2512 Age-related nuclear cataract, left eye: Secondary | ICD-10-CM | POA: Diagnosis not present

## 2016-02-15 DIAGNOSIS — H2511 Age-related nuclear cataract, right eye: Secondary | ICD-10-CM | POA: Diagnosis not present

## 2016-02-21 DIAGNOSIS — H25011 Cortical age-related cataract, right eye: Secondary | ICD-10-CM | POA: Diagnosis not present

## 2016-02-21 DIAGNOSIS — H25811 Combined forms of age-related cataract, right eye: Secondary | ICD-10-CM | POA: Diagnosis not present

## 2016-02-21 DIAGNOSIS — H2511 Age-related nuclear cataract, right eye: Secondary | ICD-10-CM | POA: Diagnosis not present

## 2016-08-21 DIAGNOSIS — G4700 Insomnia, unspecified: Secondary | ICD-10-CM | POA: Diagnosis not present

## 2016-08-21 DIAGNOSIS — N529 Male erectile dysfunction, unspecified: Secondary | ICD-10-CM | POA: Diagnosis not present

## 2016-08-21 DIAGNOSIS — G500 Trigeminal neuralgia: Secondary | ICD-10-CM | POA: Diagnosis not present

## 2016-08-21 DIAGNOSIS — I10 Essential (primary) hypertension: Secondary | ICD-10-CM | POA: Diagnosis not present

## 2016-08-23 DIAGNOSIS — Z85828 Personal history of other malignant neoplasm of skin: Secondary | ICD-10-CM | POA: Diagnosis not present

## 2016-08-23 DIAGNOSIS — L814 Other melanin hyperpigmentation: Secondary | ICD-10-CM | POA: Diagnosis not present

## 2016-08-23 DIAGNOSIS — L570 Actinic keratosis: Secondary | ICD-10-CM | POA: Diagnosis not present

## 2016-08-23 DIAGNOSIS — L821 Other seborrheic keratosis: Secondary | ICD-10-CM | POA: Diagnosis not present

## 2016-08-23 DIAGNOSIS — D1801 Hemangioma of skin and subcutaneous tissue: Secondary | ICD-10-CM | POA: Diagnosis not present

## 2016-09-18 DIAGNOSIS — S6992XA Unspecified injury of left wrist, hand and finger(s), initial encounter: Secondary | ICD-10-CM | POA: Diagnosis not present

## 2016-09-18 DIAGNOSIS — Z0001 Encounter for general adult medical examination with abnormal findings: Secondary | ICD-10-CM | POA: Diagnosis not present

## 2016-10-04 DIAGNOSIS — L570 Actinic keratosis: Secondary | ICD-10-CM | POA: Diagnosis not present

## 2016-10-25 DIAGNOSIS — G500 Trigeminal neuralgia: Secondary | ICD-10-CM | POA: Diagnosis not present

## 2016-11-08 DIAGNOSIS — L570 Actinic keratosis: Secondary | ICD-10-CM | POA: Diagnosis not present

## 2017-01-03 DIAGNOSIS — Z0000 Encounter for general adult medical examination without abnormal findings: Secondary | ICD-10-CM | POA: Diagnosis not present

## 2017-01-03 DIAGNOSIS — Z125 Encounter for screening for malignant neoplasm of prostate: Secondary | ICD-10-CM | POA: Diagnosis not present

## 2017-01-03 DIAGNOSIS — Z23 Encounter for immunization: Secondary | ICD-10-CM | POA: Diagnosis not present

## 2017-01-31 DIAGNOSIS — E876 Hypokalemia: Secondary | ICD-10-CM | POA: Diagnosis not present

## 2017-04-06 DIAGNOSIS — H353131 Nonexudative age-related macular degeneration, bilateral, early dry stage: Secondary | ICD-10-CM | POA: Diagnosis not present

## 2017-04-06 DIAGNOSIS — H33052 Total retinal detachment, left eye: Secondary | ICD-10-CM | POA: Diagnosis not present

## 2017-04-26 DIAGNOSIS — I10 Essential (primary) hypertension: Secondary | ICD-10-CM | POA: Diagnosis not present

## 2017-04-26 DIAGNOSIS — G500 Trigeminal neuralgia: Secondary | ICD-10-CM | POA: Diagnosis not present

## 2017-05-08 DIAGNOSIS — Z85828 Personal history of other malignant neoplasm of skin: Secondary | ICD-10-CM | POA: Diagnosis not present

## 2017-05-08 DIAGNOSIS — L218 Other seborrheic dermatitis: Secondary | ICD-10-CM | POA: Diagnosis not present

## 2017-05-08 DIAGNOSIS — L570 Actinic keratosis: Secondary | ICD-10-CM | POA: Diagnosis not present

## 2017-05-08 DIAGNOSIS — B351 Tinea unguium: Secondary | ICD-10-CM | POA: Diagnosis not present

## 2017-05-08 DIAGNOSIS — L821 Other seborrheic keratosis: Secondary | ICD-10-CM | POA: Diagnosis not present

## 2017-07-04 DIAGNOSIS — Z0184 Encounter for antibody response examination: Secondary | ICD-10-CM | POA: Diagnosis not present

## 2017-07-04 DIAGNOSIS — G500 Trigeminal neuralgia: Secondary | ICD-10-CM | POA: Diagnosis not present

## 2017-07-04 DIAGNOSIS — G4700 Insomnia, unspecified: Secondary | ICD-10-CM | POA: Diagnosis not present

## 2017-07-04 DIAGNOSIS — I10 Essential (primary) hypertension: Secondary | ICD-10-CM | POA: Diagnosis not present

## 2017-12-04 ENCOUNTER — Other Ambulatory Visit: Payer: Self-pay | Admitting: Family Medicine

## 2017-12-04 ENCOUNTER — Ambulatory Visit
Admission: RE | Admit: 2017-12-04 | Discharge: 2017-12-04 | Disposition: A | Discharge status: Home / Self Care | Payer: BLUE CROSS/BLUE SHIELD | Source: Ambulatory Visit | Attending: Family Medicine | Admitting: Family Medicine

## 2017-12-04 DIAGNOSIS — S92511A Displaced fracture of proximal phalanx of right lesser toe(s), initial encounter for closed fracture: Secondary | ICD-10-CM | POA: Diagnosis not present

## 2017-12-04 DIAGNOSIS — M79674 Pain in right toe(s): Secondary | ICD-10-CM

## 2018-01-09 DIAGNOSIS — Z136 Encounter for screening for cardiovascular disorders: Secondary | ICD-10-CM | POA: Diagnosis not present

## 2018-01-09 DIAGNOSIS — Z125 Encounter for screening for malignant neoplasm of prostate: Secondary | ICD-10-CM | POA: Diagnosis not present

## 2018-01-09 DIAGNOSIS — Z1159 Encounter for screening for other viral diseases: Secondary | ICD-10-CM | POA: Diagnosis not present

## 2018-01-09 DIAGNOSIS — Z0000 Encounter for general adult medical examination without abnormal findings: Secondary | ICD-10-CM | POA: Diagnosis not present

## 2018-01-09 DIAGNOSIS — Z23 Encounter for immunization: Secondary | ICD-10-CM | POA: Diagnosis not present

## 2018-02-11 DIAGNOSIS — R899 Unspecified abnormal finding in specimens from other organs, systems and tissues: Secondary | ICD-10-CM | POA: Diagnosis not present

## 2018-03-12 DIAGNOSIS — Z23 Encounter for immunization: Secondary | ICD-10-CM | POA: Diagnosis not present

## 2018-04-09 DIAGNOSIS — Z961 Presence of intraocular lens: Secondary | ICD-10-CM | POA: Diagnosis not present

## 2018-04-09 DIAGNOSIS — H33002 Unspecified retinal detachment with retinal break, left eye: Secondary | ICD-10-CM | POA: Diagnosis not present

## 2018-04-09 DIAGNOSIS — D3131 Benign neoplasm of right choroid: Secondary | ICD-10-CM | POA: Diagnosis not present

## 2018-04-09 DIAGNOSIS — H26493 Other secondary cataract, bilateral: Secondary | ICD-10-CM | POA: Diagnosis not present

## 2018-04-09 DIAGNOSIS — H353131 Nonexudative age-related macular degeneration, bilateral, early dry stage: Secondary | ICD-10-CM | POA: Diagnosis not present

## 2018-04-25 DIAGNOSIS — G500 Trigeminal neuralgia: Secondary | ICD-10-CM | POA: Diagnosis not present

## 2018-05-07 DIAGNOSIS — L821 Other seborrheic keratosis: Secondary | ICD-10-CM | POA: Diagnosis not present

## 2018-05-07 DIAGNOSIS — L218 Other seborrheic dermatitis: Secondary | ICD-10-CM | POA: Diagnosis not present

## 2018-05-07 DIAGNOSIS — Z85828 Personal history of other malignant neoplasm of skin: Secondary | ICD-10-CM | POA: Diagnosis not present

## 2018-05-07 DIAGNOSIS — L570 Actinic keratosis: Secondary | ICD-10-CM | POA: Diagnosis not present

## 2018-05-23 ENCOUNTER — Ambulatory Visit
Admission: RE | Admit: 2018-05-23 | Discharge: 2018-05-23 | Disposition: A | Discharge status: Home / Self Care | Payer: BLUE CROSS/BLUE SHIELD | Source: Ambulatory Visit | Attending: Family Medicine | Admitting: Family Medicine

## 2018-05-23 ENCOUNTER — Other Ambulatory Visit: Payer: Self-pay | Admitting: Family Medicine

## 2018-05-23 DIAGNOSIS — W19XXXA Unspecified fall, initial encounter: Secondary | ICD-10-CM

## 2018-05-23 DIAGNOSIS — R0781 Pleurodynia: Secondary | ICD-10-CM

## 2018-05-23 DIAGNOSIS — M545 Low back pain: Secondary | ICD-10-CM | POA: Diagnosis not present

## 2018-05-31 DIAGNOSIS — M545 Low back pain: Secondary | ICD-10-CM | POA: Diagnosis not present

## 2019-06-12 IMAGING — CR RIGHT RIBS AND CHEST - 3+ VIEW
3 series · 3 of 3 positions shown · non-contrast
Comparison: 11/22/2011

CLINICAL DATA: 67-year-old male with a history of fall 2 days prior

EXAM:
RIGHT RIBS AND CHEST - 3+ VIEW

[w chest pa]
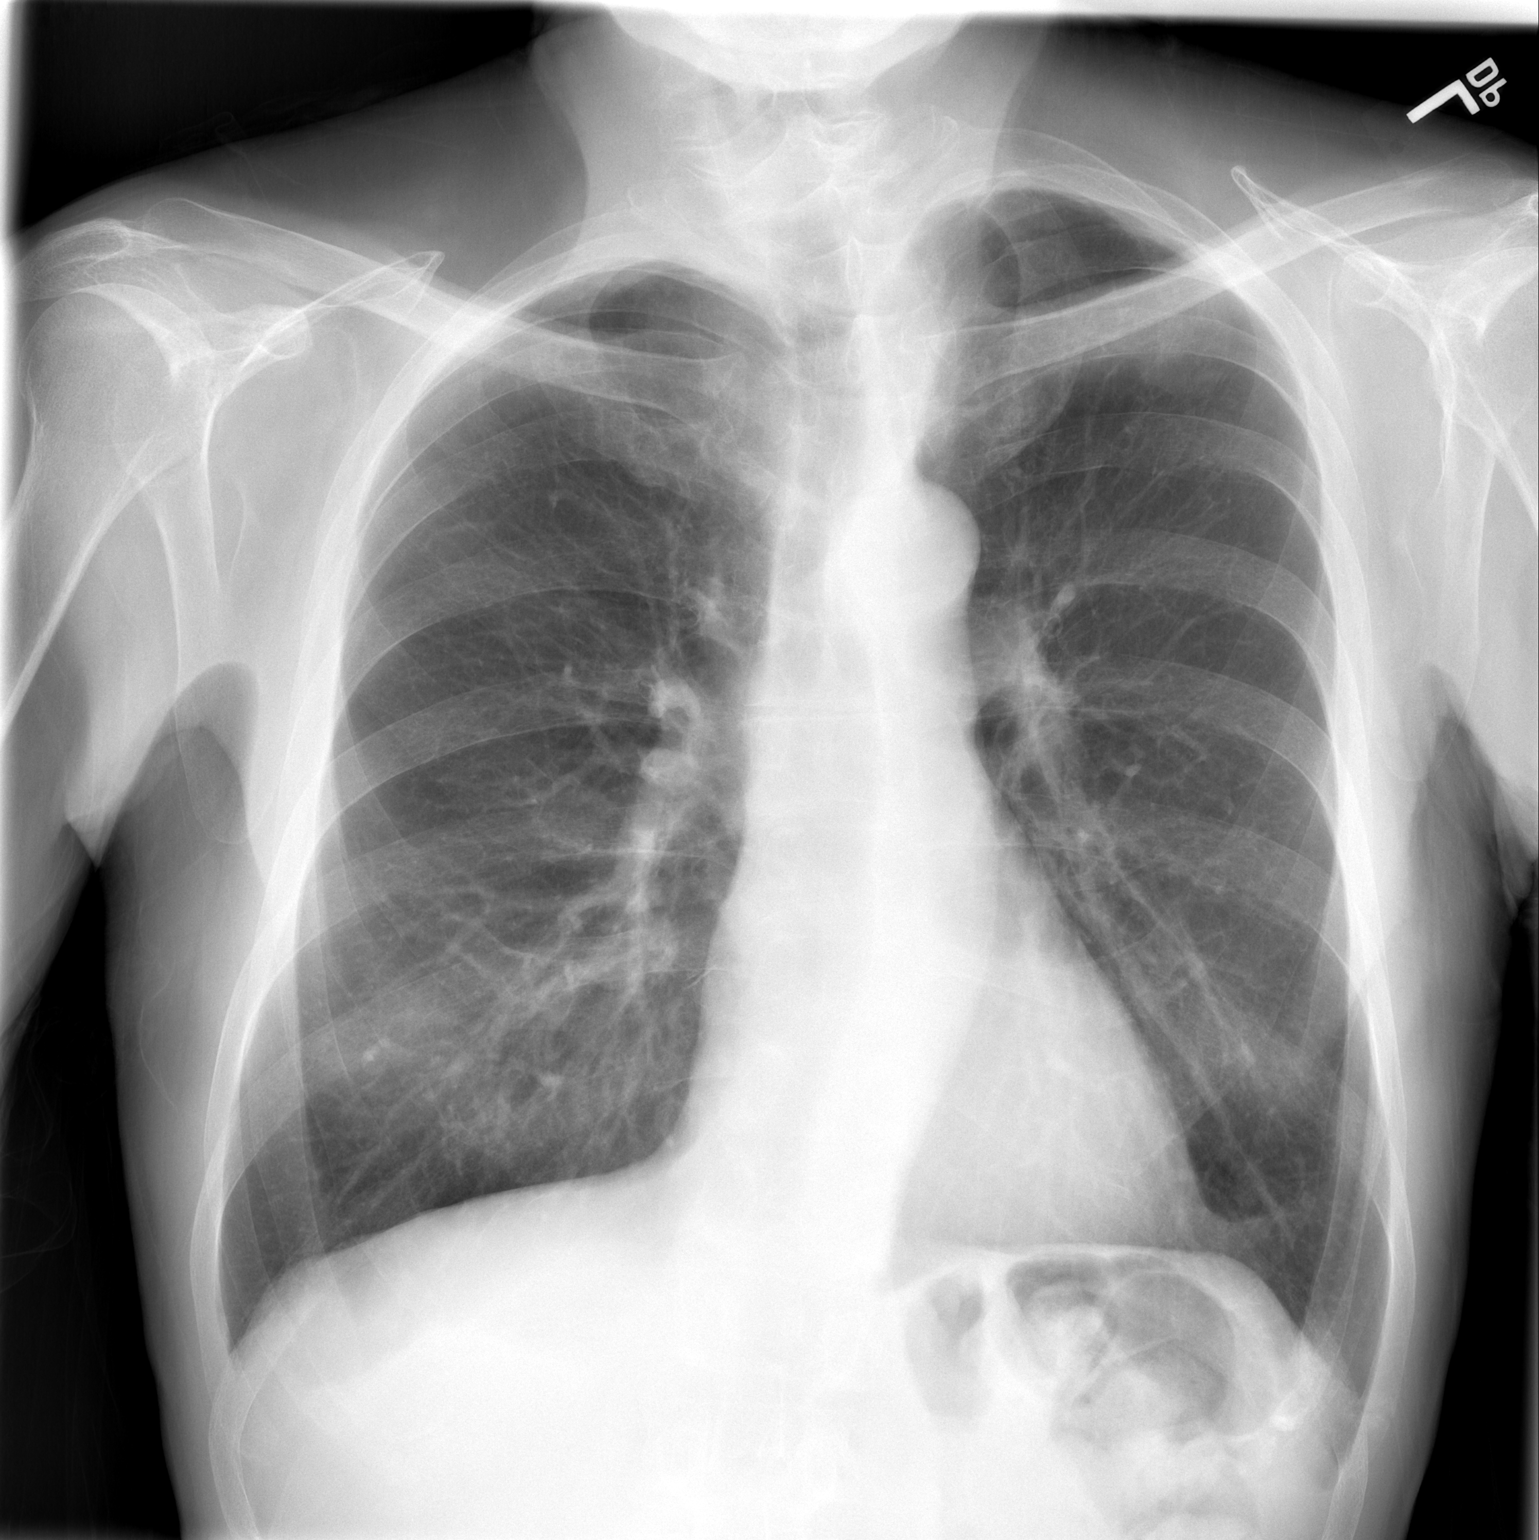

[w ribs ap/pa lower right *]
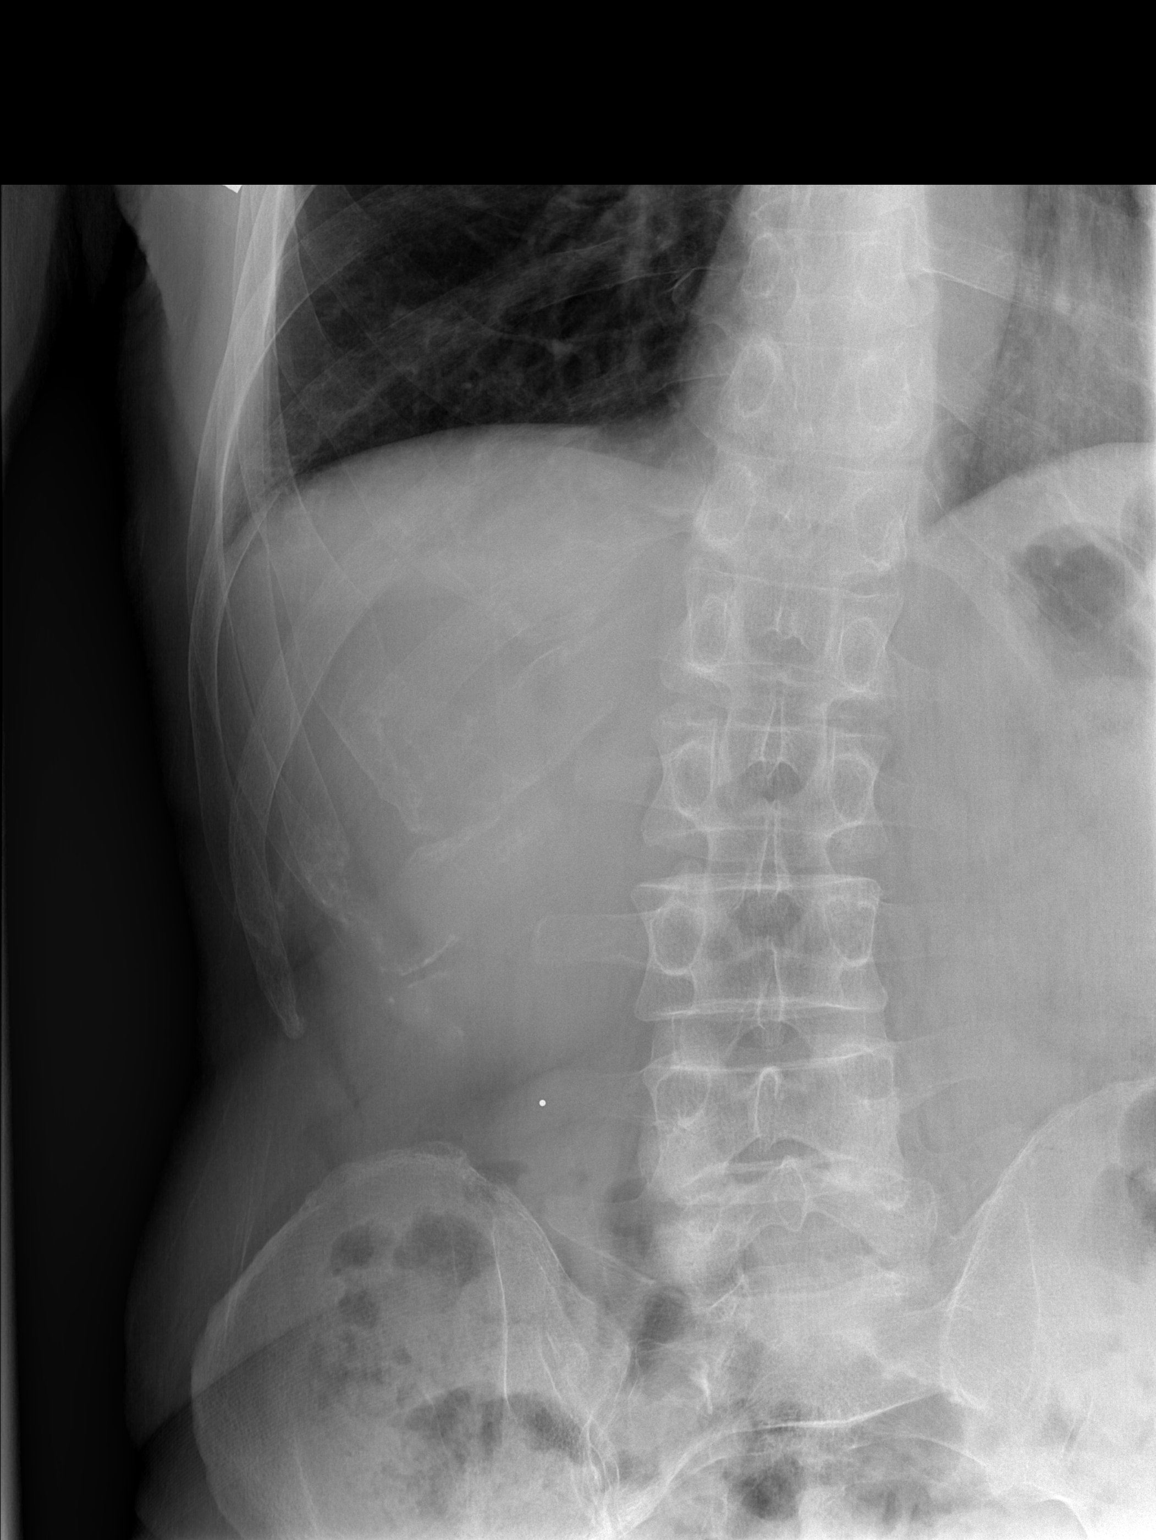

[w ribs oblique right *]
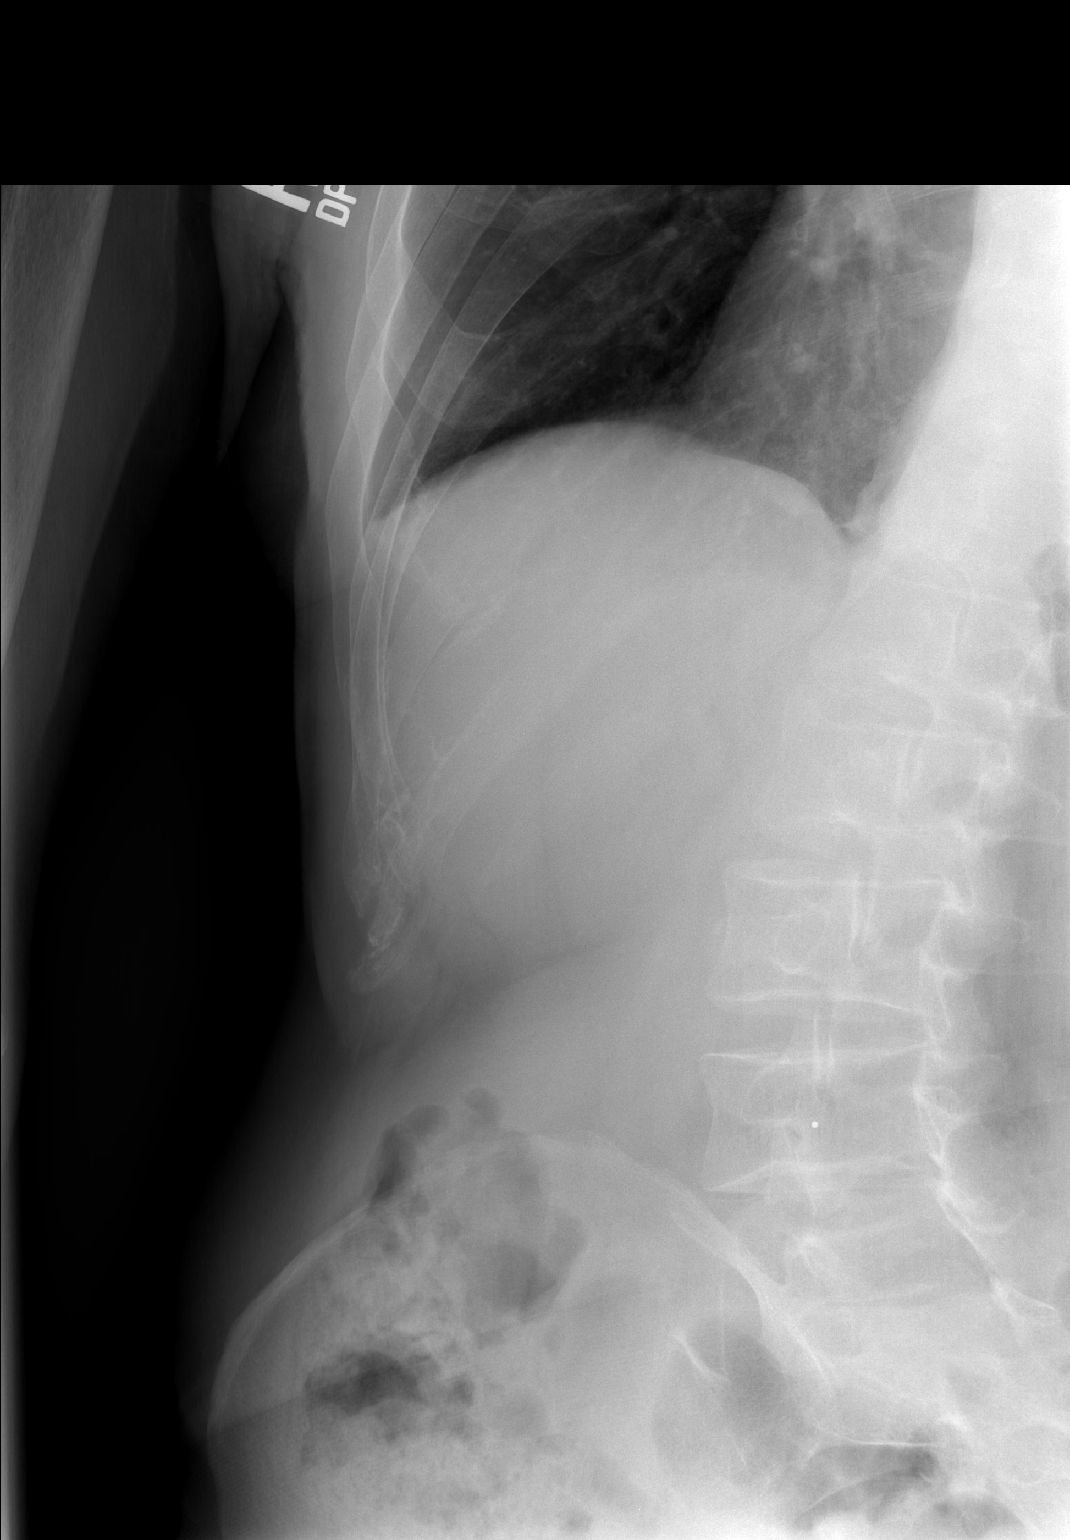

[3 of 3 positions shown; findings below may reference images not displayed]

FINDINGS: Chest:

Cardiomediastinal silhouette within normal limits in size and
contour.

No interlobular septal thickening. No pneumothorax or pleural
effusion.

Nodular densities in the lower lungs correspond to nipple markers on
the prior plain film.

No confluent airspace disease.  Coarsened interstitial markings.

No acute displaced fracture
IMPRESSION: Negative for acute cardiopulmonary disease.

Negative for acute displaced fracture

## 2019-07-15 ENCOUNTER — Other Ambulatory Visit: Payer: Self-pay

## 2019-07-15 ENCOUNTER — Ambulatory Visit: Payer: BLUE CROSS/BLUE SHIELD | Admitting: Neurology

## 2019-07-15 ENCOUNTER — Encounter: Payer: Self-pay | Admitting: Neurology

## 2019-07-15 ENCOUNTER — Telehealth: Payer: Self-pay

## 2019-07-15 VITALS — BP 162/86 | HR 60 | Ht 73.0 in | Wt 157.0 lb

## 2019-07-15 DIAGNOSIS — G500 Trigeminal neuralgia: Secondary | ICD-10-CM

## 2019-07-15 DIAGNOSIS — G5132 Clonic hemifacial spasm, left: Secondary | ICD-10-CM | POA: Insufficient documentation

## 2019-07-15 MED ORDER — GABAPENTIN 300 MG PO CAPS: 600.0000 mg | ORAL_CAPSULE | Freq: Three times a day (TID) | ORAL | 3 refills | Status: DC

## 2019-07-15 NOTE — Progress Notes (Signed)
PATIENT: James Ibarra DOB: [birth year]  Chief Complaint  Patient presents with  . New Patient (Initial Visit)    New room, alone. PCP: Dr. London Pepper. Referring: Magnus Sinning, PA. Vision: 20/30 with correction bilaterally  . Eye Twitching    L eye with spasms for past 2 years. Has gotten progressively worse.     HISTORICAL  James Ibarra is a 69 year old male, seen in request by his primary care physician Dr. 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  He has past medical history of hypertension, hyperlipidemia  He reported intermittent right facial pain since 2013, starting from right upper jaw, radiating along his right cheek, intense pain can last 15 seconds, intermittent, he had MRI of orbits with without contrast in September 2016 which showed no significant structural abnormality  Over the years, he was seen by neurosurgeon Dr. Deri Fuelling, responding well to gabapentin 300 mg 3 times daily, with medicine, he reported 1-2 out of 10 intermittent pain, recently because the frequency of his radiating pain, gabapentin was increased to 300 mg 4 times a day, he tolerating medication well, there was no significant side effect noted.  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,  Around 2019, he also noticed intermittent left facial twitching, mainly involving muscle around his left eye, to the point of closing his left arm and sometimes, most noticeable during the morning time when he first wake up, and nighttime, sometimes affecting his reading, it presents more than 50% of the time, he denied left facial pain  I personally reviewed MRI orbits with without contrast in September 2016, no acute abnormality, right superior cerebellar artery contact the right trigeminal nerve IV millimeters from  pontine surface   REVIEW OF SYSTEMS: Full 14 system review of systems performed and notable only for as above All other review of systems were negative.  ALLERGIES: No Known Allergies  HOME MEDICATIONS: Current Outpatient Medications  Medication Sig Dispense Refill  . amLODipine (NORVASC) 5 MG tablet Take 5 mg by mouth daily before breakfast.     . gabapentin (NEURONTIN) 300 MG capsule Take 300 mg by mouth 4 (four) times daily.     Marland Kitchen ketoconazole (NIZORAL) 2 % cream as needed.    . Multiple Vitamin (MULTIVITAMIN WITH MINERALS) TABS Take 1 tablet by mouth daily.    Marland Kitchen olmesartan-hydrochlorothiazide (BENICAR HCT) 40-25 MG per tablet Take 1 tablet by mouth daily before breakfast.     . POTASSIUM GLUCONATE PO Take 1 tablet by mouth daily.    . sildenafil (REVATIO) 20 MG tablet Take 20 mg by mouth daily as needed.    . traZODone (DESYREL) 100 MG tablet Take 100 mg by mouth at bedtime.     No current facility-administered medications for this visit.    PAST MEDICAL HISTORY: Past Medical History:  Diagnosis Date  . History of nonmelanoma skin cancer   . Hypercholesteremia   . Hypertension   . Trigeminal neuralgia     PAST SURGICAL HISTORY: Past Surgical History:  Procedure Laterality Date  . HERNIA REPAIR  11/07/11   LIH  . INGUINAL HERNIA REPAIR  11/07/2011   Procedure: LAPAROSCOPIC INGUINAL HERNIA;  Surgeon: Gayland Curry, MD,FACS;  Location: [facility];  Service: General;  Laterality: Left;  . RETINAL DETACHMENT SURGERY  1997  . SKIN  CANCER EXCISION  2012, 1999   basal cell - neck and hand    FAMILY HISTORY: Family History  Problem Relation Age of Onset  . Heart disease Father   . Heart disease Mother     SOCIAL HISTORY: Social History   Socioeconomic History  . Marital status: Married    Spouse name: Not on file  . Number of children: Not on file  . Years of education: Not on file  . Highest education level: Not on file  Occupational History  . Not on file  Tobacco  Use  . Smoking status: Never Smoker  . Smokeless tobacco: Never Used  Substance and Sexual Activity  . Alcohol use: Yes    Comment: 3 glasses of wine a day  . Drug use: No  . Sexual activity: Not on file  Other Topics Concern  . Not on file  Social History Narrative  . Not on file   Social Determinants of Health   Financial Resource Strain:   . Difficulty of Paying Living Expenses:   Food Insecurity:   . Worried About Charity fundraiser in the Last Year:   . Arboriculturist in the Last Year:   Transportation Needs:   . Film/video editor (Medical):   Marland Kitchen Lack of Transportation (Non-Medical):   Physical Activity:   . Days of Exercise per Week:   . Minutes of Exercise per Session:   Stress:   . Feeling of Stress :   Social Connections:   . Frequency of Communication with Friends and Family:   . Frequency of Social Gatherings with Friends and Family:   . Attends Religious Services:   . Active Member of Clubs or Organizations:   . Attends Archivist Meetings:   Marland Kitchen Marital Status:   Intimate Partner Violence:   . Fear of Current or Ex-Partner:   . Emotionally Abused:   Marland Kitchen Physically Abused:   . Sexually Abused:      PHYSICAL EXAM   Vitals:   07/15/19 0856  BP: (!) 162/86  Pulse: 60  Weight: 157 lb (71.2 kg)  Height: 6\' 1"  (1.854 m)    Not recorded      Body mass index is 20.71 kg/m.  PHYSICAL EXAMNIATION:  Gen: NAD, conversant, well nourised, well groomed                     Cardiovascular: Regular rate rhythm, no peripheral edema, warm, nontender. Eyes: Conjunctivae clear without exudates or hemorrhage Neck: Supple, no carotid bruits. Pulmonary: Clear to auscultation bilaterally   NEUROLOGICAL EXAM:  MENTAL STATUS: Speech:    Speech is normal; fluent and spontaneous with normal comprehension.  Cognition:     Orientation to time, place and person     Normal recent and remote memory     Normal Attention span and concentration     Normal  Language, naming, repeating,spontaneous speech     Fund of knowledge   CRANIAL NERVES: CN II: Visual fields are full to confrontation. Pupils are round equal and briskly reactive to light. CN III, IV, VI: extraocular movement are normal. No ptosis. CN V: Facial sensation is intact to light touch CN VII: He has almost constant left orbicularis oculi twitching, also involving left frontalis muscle, left zygomatic major CN VIII: Hearing is normal to causal conversation. CN IX, X: Acute phonation is normal. CN XI: Head turning and shoulder shrug are intact CN XII: Tongue midline, normal strength,  MOTOR: There  is no pronator drift of out-stretched arms. Muscle bulk and tone are normal. Muscle strength is normal.  REFLEXES: Reflexes are 2+ and symmetric at the biceps, triceps, knees, and ankles. Plantar responses are flexor.  SENSORY: Intact to light touch, pinprick and vibratory sensation are intact in fingers and toes.  COORDINATION: There is no trunk or limb dysmetria noted.  GAIT/STANCE: Posture is normal. Gait is steady with normal steps, base, arm swing, and turning. Heel and toe walking are normal. Tandem gait is normal.  Romberg is absent.   DIAGNOSTIC DATA (LABS, IMAGING, TESTING) - I reviewed patient records, labs, notes, testing and imaging myself where available.   ASSESSMENT AND PLAN  NYGIL LUTTRULL is a 69 y.o. male   Right trigeminal neuralgia Left hemifacial spasm  MRI of orbit with without contrast in September 2016 was normal  Reported normal laboratory evaluation through his primary care physician at [facility]  Will increase gabapentin to 300 mg 2 tablets twice a day, he tolerating medicine well, there was no significant side effect noted, does help his right trigeminal pain  Preauthorization for Xeomin 50 units for left hemifacial spasm, return to clinic in 4 weeks for EMG guided injection.   Marcial Pacas, M.D. Ph.D.  [facility] [address], [place], [place] [address] Ph: [phone] Fax: [phone]  CC: London Pepper, MD

## 2019-07-15 NOTE — Telephone Encounter (Signed)
Noted  

## 2019-07-15 NOTE — Telephone Encounter (Signed)
Xeomin consent signed by Dr. Krista Blue and pt given to Richmond Campbell for processing.

## 2019-07-22 ENCOUNTER — Telehealth: Payer: Self-pay | Admitting: Neurology

## 2019-07-22 NOTE — Telephone Encounter (Signed)
Patient has an Xeomin appointment on 6/9. I called [employer] [phone]) and spoke with Rosann Auerbach., who states codes [phone], [phone], [phone] are all valid and billable. He states Josem Kaufmann is not required. B/B. Ref # [id number].

## 2019-08-05 ENCOUNTER — Ambulatory Visit: Payer: Medicare Other | Admitting: Neurology

## 2019-08-13 ENCOUNTER — Encounter: Payer: Self-pay | Admitting: Neurology

## 2019-08-13 ENCOUNTER — Other Ambulatory Visit: Payer: Self-pay | Admitting: *Deleted

## 2019-08-13 ENCOUNTER — Ambulatory Visit: Payer: Medicare Other | Admitting: Neurology

## 2019-08-13 VITALS — BP 142/86 | HR 63 | Ht 73.0 in | Wt 155.5 lb

## 2019-08-13 DIAGNOSIS — G5132 Clonic hemifacial spasm, left: Secondary | ICD-10-CM | POA: Diagnosis not present

## 2019-08-13 MED ORDER — GABAPENTIN 300 MG PO CAPS: 600.0000 mg | ORAL_CAPSULE | Freq: Three times a day (TID) | ORAL | 3 refills | Status: DC

## 2019-08-13 MED ORDER — INCOBOTULINUMTOXINA 50 UNITS IM SOLR
50.0000 [IU] | INTRAMUSCULAR | Status: DC
  Administered 2019-08-13: 50 [IU] via INTRAMUSCULAR

## 2019-08-13 NOTE — Progress Notes (Signed)
**  Xeomin 50 units x 1 vial, NDC 0259-1605-01, Lot [id number], Exp 06/2021, office supply, 1st injection.//mck,rn**

## 2019-08-13 NOTE — Progress Notes (Signed)
PATIENT: James Ibarra DOB: 1950/11/17  Chief Complaint  Patient presents with  . Hemifacial Spasm    Xeomin 50 units x 1 vial - office supply     HISTORICAL  James Ibarra is a 69 year old male, seen in request by his primary care physician Dr. 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  He has past medical history of hypertension, hyperlipidemia  He reported intermittent right facial pain since 2013, starting from right upper jaw, radiating along his right cheek, intense pain can last 15 seconds, intermittent, he had MRI of orbits with without contrast in September 2016 which showed no significant structural abnormality  Over the years, he was seen by neurosurgeon Dr. Deri Fuelling, responding well to gabapentin 300 mg 3 times daily, with medicine, he reported 1-2 out of 10 intermittent pain, recently because the frequency of his radiating pain, gabapentin was increased to 300 mg 4 times a day, he tolerating medication well, there was no significant side effect noted.  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,  Around 2019, he also noticed intermittent left facial twitching, mainly involving muscle around his left eye, to the point of closing his left arm and sometimes, most noticeable during the morning time when he first wake up, and nighttime, sometimes affecting his reading, it presents more than 50% of the time, he denied left facial pain  I personally reviewed MRI orbits with without contrast in September 2016, no acute abnormality, right superior cerebellar artery contact the right trigeminal nerve IV millimeters from pontine surface  UPDATE August 13 2019: This is his first EMG guided Xeomin injection for left hemifacial spasm, related questions were answered.  REVIEW OF SYSTEMS: Full  14 system review of systems performed and notable only for as above All other review of systems were negative.  ALLERGIES: No Known Allergies  HOME MEDICATIONS: Current Outpatient Medications  Medication Sig Dispense Refill  . amLODipine (NORVASC) 5 MG tablet Take 5 mg by mouth daily before breakfast.     . incobotulinumtoxinA (XEOMIN) 50 units SOLR injection Inject 50 Units into the muscle every 3 (three) months.    Marland Kitchen ketoconazole (NIZORAL) 2 % cream as needed.    . Multiple Vitamin (MULTIVITAMIN WITH MINERALS) TABS Take 1 tablet by mouth daily.    Marland Kitchen olmesartan-hydrochlorothiazide (BENICAR HCT) 40-25 MG per tablet Take 1 tablet by mouth daily before breakfast.     . POTASSIUM GLUCONATE PO Take 1 tablet by mouth daily.    . sildenafil (REVATIO) 20 MG tablet Take 20 mg by mouth daily as needed.    . traZODone (DESYREL) 100 MG tablet Take 100 mg by mouth at bedtime.    . gabapentin (NEURONTIN) 300 MG capsule Take 2 capsules (600 mg total) by mouth 3 (three) times daily. 540 capsule 3   No current facility-administered medications for this visit.    PAST MEDICAL HISTORY: Past Medical History:  Diagnosis Date  . History of nonmelanoma skin cancer   . Hypercholesteremia   . Hypertension   . Trigeminal neuralgia     PAST SURGICAL HISTORY: Past Surgical History:  Procedure Laterality Date  . HERNIA REPAIR  11/07/11   LIH  . INGUINAL HERNIA REPAIR  11/07/2011   Procedure: LAPAROSCOPIC INGUINAL HERNIA;  Surgeon: Gayland Curry, MD,FACS;  Location: [facility];  Service: General;  Laterality: Left;  . RETINAL DETACHMENT SURGERY  1997  . SKIN CANCER EXCISION  2012, 1999   basal cell - neck and hand    FAMILY HISTORY: Family History  Problem Relation Age of Onset  . Ibarra disease Father   . Ibarra disease Mother     SOCIAL HISTORY: Social History   Socioeconomic History  . Marital status: Married    Spouse name: Not on file  . Number of children: Not on file  . Years of education:  Not on file  . Highest education level: Not on file  Occupational History  . Not on file  Tobacco Use  . Smoking status: Never Smoker  . Smokeless tobacco: Never Used  Substance and Sexual Activity  . Alcohol use: Yes    Comment: 3 glasses of wine a day  . Drug use: No  . Sexual activity: Not on file  Other Topics Concern  . Not on file  Social History Narrative  . Not on file   Social Determinants of Health   Financial Resource Strain:   . Difficulty of Paying Living Expenses:   Food Insecurity:   . Worried About Charity fundraiser in the Last Year:   . Arboriculturist in the Last Year:   Transportation Needs:   . Film/video editor (Medical):   Marland Kitchen Lack of Transportation (Non-Medical):   Physical Activity:   . Days of Exercise per Week:   . Minutes of Exercise per Session:   Stress:   . Feeling of Stress :   Social Connections:   . Frequency of Communication with Friends and Family:   . Frequency of Social Gatherings with Friends and Family:   . Attends Religious Services:   . Active Member of Clubs or Organizations:   . Attends Archivist Meetings:   Marland Kitchen Marital Status:   Intimate Partner Violence:   . Fear of Current or Ex-Partner:   . Emotionally Abused:   Marland Kitchen Physically Abused:   . Sexually Abused:      PHYSICAL EXAM   Vitals:   08/13/19 1547  BP: (!) 142/86  Pulse: 63  Weight: 155 lb 8 oz (70.5 kg)  Height: 6\' 1"  (1.854 m)    Not recorded      Body mass index is 20.52 kg/m.  PHYSICAL EXAMNIATION:  He has almost constant left orbicularis oculi twitching, also involving left frontalis muscle, left zygomatic major    ASSESSMENT AND PLAN  James Ibarra is a 69 y.o. male   Right trigeminal neuralgia Left hemifacial spasm  MRI of orbit with without contrast in September 2016 was normal   EMG guided Xeomin injection for left hemifacial spasm, initial ejection was on August 13, 2019  Under EMG guidance, 50 units of Xeomin was  used  Left orbicularis oculi, at 2, 3, 4, 5, 6, 7, 8, 10:00(2.5x8=20 Units)  Left frontalis 5 units Right frontalis 5 units  Right orbicularis oculi 5 units  Right corrugate 5 Left corrugate 5 Procereus  5  Will return to clinic in 3 months for repeat injection   Marcial Pacas, M.D. Ph.D.  [facility] [address], [place], [place] [address] Ph: [phone] Fax: [phone]  CC: London Pepper, MD

## 2019-08-26 ENCOUNTER — Ambulatory Visit: Payer: Medicare Other | Admitting: Neurology

## 2019-11-18 ENCOUNTER — Encounter: Payer: Self-pay | Admitting: *Deleted

## 2019-11-18 DIAGNOSIS — G5139 Clonic hemifacial spasm, unspecified: Secondary | ICD-10-CM | POA: Insufficient documentation

## 2019-11-19 ENCOUNTER — Ambulatory Visit: Payer: Medicare Other | Admitting: Neurology

## 2019-11-19 VITALS — BP 138/83 | HR 57

## 2019-11-19 DIAGNOSIS — G5132 Clonic hemifacial spasm, left: Secondary | ICD-10-CM

## 2019-11-19 MED ORDER — INCOBOTULINUMTOXINA 50 UNITS IM SOLR
50.0000 [IU] | INTRAMUSCULAR | Status: DC
  Administered 2019-11-19: 50 [IU] via INTRAMUSCULAR

## 2019-11-19 NOTE — Progress Notes (Signed)
**  Xeomin 50 units x 1 vial, NDC 0259-1605-01, Lot [id number], Exp 06/2021, office supply.Jill Side**

## 2019-11-19 NOTE — Progress Notes (Signed)
PATIENT: James Ibarra DOB: 30-Jul-1950  Chief Complaint  Patient presents with  . Xeomin    in new room, here alone. office supply 50 units.      HISTORICAL  James Ibarra is a 69 year old male, seen in request by his primary care physician Dr. 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  He has past medical history of hypertension, hyperlipidemia  He reported intermittent right facial pain since 2013, starting from right upper jaw, radiating along his right cheek, intense pain can last 15 seconds, intermittent, he had MRI of orbits with without contrast in September 2016 which showed no significant structural abnormality  Over the years, he was seen by neurosurgeon Dr. Deri Fuelling, responding well to gabapentin 300 mg 3 times daily, with medicine, he reported 1-2 out of 10 intermittent pain, recently because the frequency of his radiating pain, gabapentin was increased to 300 mg 4 times a day, he tolerating medication well, there was no significant side effect noted.  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,  Around 2019, he also noticed intermittent left facial twitching, mainly involving muscle around his left eye, to the point of closing his left arm and sometimes, most noticeable during the morning time when he first wake up, and nighttime, sometimes affecting his reading, it presents more than 50% of the time, he denied left facial pain  I personally reviewed MRI orbits with without contrast in September 2016, no acute abnormality, right superior cerebellar artery contact the right trigeminal nerve IV millimeters from pontine surface  UPDATE August 13 2019: This is his first EMG guided Xeomin injection for left hemifacial spasm, related questions were answered.  UPDATE Sept 15 2021: He  responded very well to his first EMG guided Xeomin injection for left hemifacial spasm, it took couple weeks to kick in, there was no significant side effect noted  REVIEW OF SYSTEMS: Full 14 system review of systems performed and notable only for as above All other review of systems were negative.  ALLERGIES: No Known Allergies  HOME MEDICATIONS: Current Outpatient Medications  Medication Sig Dispense Refill  . amLODipine (NORVASC) 5 MG tablet Take 5 mg by mouth daily before breakfast.     . gabapentin (NEURONTIN) 300 MG capsule Take 2 capsules (600 mg total) by mouth 3 (three) times daily. 540 capsule 3  . incobotulinumtoxinA (XEOMIN) 50 units SOLR injection Inject 50 Units into the muscle every 3 (three) months.    Marland Kitchen ketoconazole (NIZORAL) 2 % cream as needed.    . Multiple Vitamin (MULTIVITAMIN WITH MINERALS) TABS Take 1 tablet by mouth daily.    . Multiple Vitamins-Minerals (ICAPS AREDS 2 PO) Take 1 capsule by mouth in the morning and at bedtime.    Marland Kitchen olmesartan-hydrochlorothiazide (BENICAR HCT) 40-25 MG per tablet Take 1 tablet by mouth daily before breakfast.     . POTASSIUM GLUCONATE PO Take 1 tablet by mouth daily.    . sildenafil (REVATIO) 20 MG tablet Take 20 mg by mouth daily as needed.    . traZODone (DESYREL) 100 MG tablet Take 100 mg by mouth at bedtime.     Current Facility-Administered Medications  Medication Dose Route Frequency Provider Last Rate Last Admin  . incobotulinumtoxinA (XEOMIN) 50 units injection 50 Units  50 Units Intramuscular Q90 days Marcial Pacas, MD  50 Units at 08/13/19 1723    PAST MEDICAL HISTORY: Past Medical History:  Diagnosis Date  . History of nonmelanoma skin cancer   . Hypercholesteremia   . Hypertension   . Trigeminal neuralgia     PAST SURGICAL HISTORY: Past Surgical History:  Procedure Laterality Date  . HERNIA REPAIR  11/07/11   LIH  . INGUINAL HERNIA REPAIR  11/07/2011   Procedure: LAPAROSCOPIC INGUINAL HERNIA;  Surgeon: Gayland Curry, MD,FACS;  Location: [facility];  Service: General;  Laterality: Left;  . RETINAL DETACHMENT SURGERY  1997  . SKIN CANCER EXCISION  2012, 1999   basal cell - neck and hand    FAMILY HISTORY: Family History  Problem Relation Age of Onset  . Heart disease Father   . Heart disease Mother     SOCIAL HISTORY: Social History   Socioeconomic History  . Marital status: Married    Spouse name: Not on file  . Number of children: Not on file  . Years of education: Not on file  . Highest education level: Not on file  Occupational History  . Not on file  Tobacco Use  . Smoking status: Never Smoker  . Smokeless tobacco: Never Used  Substance and Sexual Activity  . Alcohol use: Yes    Comment: 3 glasses of wine a day  . Drug use: No  . Sexual activity: Not on file  Other Topics Concern  . Not on file  Social History Narrative  . Not on file   Social Determinants of Health   Financial Resource Strain:   . Difficulty of Paying Living Expenses: Not on file  Food Insecurity:   . Worried About Charity fundraiser in the Last Year: Not on file  . Ran Out of Food in the Last Year: Not on file  Transportation Needs:   . Lack of Transportation (Medical): Not on file  . Lack of Transportation (Non-Medical): Not on file  Physical Activity:   . Days of Exercise per Week: Not on file  . Minutes of Exercise per Session: Not on file  Stress:   . Feeling of Stress : Not on file  Social Connections:   . Frequency of Communication with Friends and Family: Not on file  . Frequency of Social Gatherings with Friends and Family: Not on file  . Attends Religious Services: Not on file  . Active Member of Clubs or Organizations: Not on file  . Attends Archivist Meetings: Not on file  . Marital Status: Not on file  Intimate Partner Violence:   . Fear of Current or Ex-Partner: Not on file  . Emotionally Abused: Not on file  . Physically Abused: Not on file  . Sexually Abused: Not  on file     PHYSICAL EXAM   Vitals:   11/19/19 1152  BP: 138/83  Pulse: (!) 57   Not recorded     There is no height or weight on file to calculate BMI.  PHYSICAL EXAMNIATION:  He has almost occasionally left orbicularis oculi twitching, also involving left lateral frontalis muscle, left zygomatic major  ASSESSMENT AND PLAN  James Ibarra is a 69 y.o. male   Right trigeminal neuralgia Left hemifacial spasm  MRI of orbit with without contrast in September 2016 was normal   EMG guided Xeomin injection for left hemifacial spasm, initial ejection was on August 13, 2019  Under EMG guidance, 50 units of Xeomin was used  Left orbicularis oculi, at 2, 3,  4, 5, 6, 7, 8, (2.5x7= 17.5 units)  Left frontalis 5 units Right frontalis 5 units Procerus 5 units  Left zygomatic major 2.5 units  Right orbicularis oculi 5 units  Left frontalis 5 units Right frontalis 5 units  Will return to clinic in 3 months for repeat injection   Marcial Pacas, M.D. Ph.D.   [facility] [address] [place], [place] [address] Ph: [phone] Fax: [phone]  CC: London Pepper, MD

## 2020-02-18 ENCOUNTER — Other Ambulatory Visit: Payer: Self-pay

## 2020-02-18 ENCOUNTER — Ambulatory Visit: Payer: Medicare Other | Admitting: Neurology

## 2020-02-18 ENCOUNTER — Encounter: Payer: Self-pay | Admitting: Neurology

## 2020-02-18 VITALS — BP 154/87 | HR 64 | Ht 73.0 in | Wt 154.5 lb

## 2020-02-18 DIAGNOSIS — G5132 Clonic hemifacial spasm, left: Secondary | ICD-10-CM | POA: Diagnosis not present

## 2020-02-18 MED ORDER — INCOBOTULINUMTOXINA 50 UNITS IM SOLR
50.0000 [IU] | INTRAMUSCULAR | Status: DC
  Administered 2020-02-18: 50 [IU] via INTRAMUSCULAR

## 2020-02-18 NOTE — Progress Notes (Signed)
PATIENT: James Ibarra DOB: [birth year]  Chief Complaint  Patient presents with  . Hemifacial Spasm    Xeomin     HISTORICAL  James Ibarra is a 69 year old male, seen in request by his primary care physician Dr. 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  He has past medical history of hypertension, hyperlipidemia  He reported intermittent right facial pain since 2013, starting from right upper jaw, radiating along his right cheek, intense pain can last 15 seconds, intermittent, he had MRI of orbits with without contrast in September 2016 which showed no significant structural abnormality  Over the years, he was seen by neurosurgeon Dr. Deri Fuelling, responding well to gabapentin 300 mg 3 times daily, with medicine, he reported 1-2 out of 10 intermittent pain, recently because the frequency of his radiating pain, gabapentin was increased to 300 mg 4 times a day, he tolerating medication well, there was no significant side effect noted.  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,  Around 2019, he also noticed intermittent left facial twitching, mainly involving muscle around his left eye, to the point of closing his left arm and sometimes, most noticeable during the morning time when he first wake up, and nighttime, sometimes affecting his reading, it presents more than 50% of the time, he denied left facial pain  I personally reviewed MRI orbits with without contrast in September 2016, no acute abnormality, right superior cerebellar artery contact the right trigeminal nerve IV millimeters from pontine surface  UPDATE August 13 2019: This is his first EMG guided Xeomin injection for left hemifacial spasm, related questions were answered.  UPDATE Sept 15 2021: He responded very well to his first  EMG guided Xeomin injection for left hemifacial spasm, it took couple weeks to kick in, there was no significant side effect noted  UPDATE Feb 18 2020: He responded well to previous injection  REVIEW OF SYSTEMS: Full 14 system review of systems performed and notable only for as above All other review of systems were negative.  ALLERGIES: No Known Allergies  HOME MEDICATIONS: Current Outpatient Medications  Medication Sig Dispense Refill  . amLODipine (NORVASC) 5 MG tablet Take 5 mg by mouth daily before breakfast.     . gabapentin (NEURONTIN) 300 MG capsule Take 2 capsules (600 mg total) by mouth 3 (three) times daily. 540 capsule 3  . incobotulinumtoxinA (XEOMIN) 50 units SOLR injection Inject 50 Units into the muscle every 3 (three) months.    Marland Kitchen ketoconazole (NIZORAL) 2 % cream as needed.    . melatonin 3 MG TABS tablet Take 3 mg by mouth at bedtime as needed.    . Multiple Vitamin (MULTIVITAMIN WITH MINERALS) TABS Take 1 tablet by mouth daily.    . Multiple Vitamins-Minerals (ICAPS AREDS 2 PO) Take 1 capsule by mouth in the morning and at bedtime.    . Multiple Vitamins-Minerals (PRESERVISION AREDS PO) Take 1 capsule by mouth in the morning and at bedtime.    Marland Kitchen olmesartan-hydrochlorothiazide (BENICAR HCT) 40-25 MG per tablet Take 1 tablet by mouth daily before breakfast.     . POTASSIUM GLUCONATE PO Take 1 tablet by mouth daily.    . pravastatin (PRAVACHOL) 10 MG tablet Take 10 mg by mouth at bedtime.    . sildenafil (REVATIO) 20 MG tablet Take 2-5  tables for ED orally once a day if needed    . traZODone (DESYREL) 100 MG tablet Take 50-100 mg by mouth at bedtime as needed.     Marland Kitchen aspirin EC 81 MG tablet Take 81 mg by mouth daily. Swallow whole.     No current facility-administered medications for this visit.    PAST MEDICAL HISTORY: Past Medical History:  Diagnosis Date  . History of nonmelanoma skin cancer   . Hypercholesteremia   . Hypertension   . Trigeminal neuralgia      PAST SURGICAL HISTORY: Past Surgical History:  Procedure Laterality Date  . HERNIA REPAIR  11/07/11   LIH  . INGUINAL HERNIA REPAIR  11/07/2011   Procedure: LAPAROSCOPIC INGUINAL HERNIA;  Surgeon: Gayland Curry, MD,FACS;  Location: [facility];  Service: General;  Laterality: Left;  . RETINAL DETACHMENT SURGERY  1997  . SKIN CANCER EXCISION  2012, 1999   basal cell - neck and hand    FAMILY HISTORY: Family History  Problem Relation Age of Onset  . Heart disease Father   . Heart disease Mother     SOCIAL HISTORY: Social History   Socioeconomic History  . Marital status: Married    Spouse name: Not on file  . Number of children: Not on file  . Years of education: Not on file  . Highest education level: Not on file  Occupational History  . Not on file  Tobacco Use  . Smoking status: Never Smoker  . Smokeless tobacco: Never Used  Substance and Sexual Activity  . Alcohol use: Yes    Comment: 3 glasses of wine a day  . Drug use: No  . Sexual activity: Not on file  Other Topics Concern  . Not on file  Social History Narrative  . Not on file   Social Determinants of Health   Financial Resource Strain: Not on file  Food Insecurity: Not on file  Transportation Needs: Not on file  Physical Activity: Not on file  Stress: Not on file  Social Connections: Not on file  Intimate Partner Violence: Not on file     PHYSICAL EXAM   Vitals:   02/18/20 1400  BP: (!) 154/87  Pulse: 64  Weight: 154 lb 8 oz (70.1 kg)  Height: 6\' 1"  (1.854 m)   Not recorded     Body mass index is 20.38 kg/m.  PHYSICAL EXAMNIATION:  He has almost occasionally left orbicularis oculi twitching, also involving left lateral frontalis muscle, left zygomatic major  ASSESSMENT AND PLAN  James Ibarra is a 69 y.o. male   Right trigeminal neuralgia Left hemifacial spasm  MRI of orbit with without contrast in September 2016 was normal   EMG guided Xeomin injection for left hemifacial spasm,  initial ejection was on August 13, 2019  Under EMG guidance, 50 units of Xeomin was dissolved into 1 cc of NS, used 32.5 units, discard 27.5 units  Left orbicularis oculi, at 2, 3, 4, 5, 6, 7, 8, (2.5x7= 17.5 units)  Right orbicularis oculi 5 units  Left frontalis 5 units Right frontalis 5 units  Will return to clinic in 3 months for repeat injection   Marcial Pacas, M.D. Ph.D.   [facility] [address] [place], [place] [address] Ph: [phone] Fax: [phone]  CC: London Pepper, MD

## 2020-02-18 NOTE — Progress Notes (Signed)
**  Xeomin 50 units x 1 vial, NDC 0259-1605-01, Lot [id number], Exp 06/2021, office supply.//mck,rn**

## 2020-04-12 DIAGNOSIS — H02834 Dermatochalasis of left upper eyelid: Secondary | ICD-10-CM | POA: Diagnosis not present

## 2020-04-12 DIAGNOSIS — H04123 Dry eye syndrome of bilateral lacrimal glands: Secondary | ICD-10-CM | POA: Diagnosis not present

## 2020-04-12 DIAGNOSIS — H353122 Nonexudative age-related macular degeneration, left eye, intermediate dry stage: Secondary | ICD-10-CM | POA: Diagnosis not present

## 2020-04-12 DIAGNOSIS — H33002 Unspecified retinal detachment with retinal break, left eye: Secondary | ICD-10-CM | POA: Diagnosis not present

## 2020-04-12 DIAGNOSIS — D3131 Benign neoplasm of right choroid: Secondary | ICD-10-CM | POA: Diagnosis not present

## 2020-04-12 DIAGNOSIS — H26493 Other secondary cataract, bilateral: Secondary | ICD-10-CM | POA: Diagnosis not present

## 2020-04-12 DIAGNOSIS — H31092 Other chorioretinal scars, left eye: Secondary | ICD-10-CM | POA: Diagnosis not present

## 2020-04-12 DIAGNOSIS — H02831 Dermatochalasis of right upper eyelid: Secondary | ICD-10-CM | POA: Diagnosis not present

## 2020-04-12 DIAGNOSIS — G245 Blepharospasm: Secondary | ICD-10-CM | POA: Diagnosis not present

## 2020-04-12 DIAGNOSIS — Z961 Presence of intraocular lens: Secondary | ICD-10-CM | POA: Diagnosis not present

## 2020-04-21 DIAGNOSIS — G500 Trigeminal neuralgia: Secondary | ICD-10-CM | POA: Diagnosis not present

## 2020-04-21 DIAGNOSIS — Z0000 Encounter for general adult medical examination without abnormal findings: Secondary | ICD-10-CM | POA: Diagnosis not present

## 2020-04-21 DIAGNOSIS — I10 Essential (primary) hypertension: Secondary | ICD-10-CM | POA: Diagnosis not present

## 2020-04-21 DIAGNOSIS — R253 Fasciculation: Secondary | ICD-10-CM | POA: Diagnosis not present

## 2020-04-21 DIAGNOSIS — E785 Hyperlipidemia, unspecified: Secondary | ICD-10-CM | POA: Diagnosis not present

## 2020-04-21 DIAGNOSIS — G4700 Insomnia, unspecified: Secondary | ICD-10-CM | POA: Diagnosis not present

## 2020-05-20 DIAGNOSIS — D485 Neoplasm of uncertain behavior of skin: Secondary | ICD-10-CM | POA: Diagnosis not present

## 2020-05-20 DIAGNOSIS — C4442 Squamous cell carcinoma of skin of scalp and neck: Secondary | ICD-10-CM | POA: Diagnosis not present

## 2020-05-20 DIAGNOSIS — L905 Scar conditions and fibrosis of skin: Secondary | ICD-10-CM | POA: Diagnosis not present

## 2020-05-20 DIAGNOSIS — Z85828 Personal history of other malignant neoplasm of skin: Secondary | ICD-10-CM | POA: Diagnosis not present

## 2020-05-20 DIAGNOSIS — L570 Actinic keratosis: Secondary | ICD-10-CM | POA: Diagnosis not present

## 2020-05-20 DIAGNOSIS — D044 Carcinoma in situ of skin of scalp and neck: Secondary | ICD-10-CM | POA: Diagnosis not present

## 2020-05-20 DIAGNOSIS — L218 Other seborrheic dermatitis: Secondary | ICD-10-CM | POA: Diagnosis not present

## 2020-05-25 DIAGNOSIS — E876 Hypokalemia: Secondary | ICD-10-CM | POA: Diagnosis not present

## 2020-05-26 ENCOUNTER — Encounter: Payer: Self-pay | Admitting: Neurology

## 2020-05-26 ENCOUNTER — Ambulatory Visit: Payer: Medicare Other | Admitting: Neurology

## 2020-05-26 VITALS — BP 146/82 | HR 64 | Ht 73.0 in | Wt 154.0 lb

## 2020-05-26 DIAGNOSIS — G5132 Clonic hemifacial spasm, left: Secondary | ICD-10-CM

## 2020-05-26 NOTE — Progress Notes (Signed)
**  Xeomin 50 units x 1 vial, NDC 3414-4360-16, Lot [id number], Exp 04/2021, sample medication.//mck,rn**

## 2020-05-26 NOTE — Progress Notes (Signed)
PATIENT: James Ibarra DOB: 02/21/1951  No chief complaint on file.    HISTORICAL  James Ibarra is a 70 year old male, seen in request by his primary care physician Dr. 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  He has past medical history of hypertension, hyperlipidemia  He reported intermittent right facial pain since 2013, starting from right upper jaw, radiating along his right cheek, intense pain can last 15 seconds, intermittent, he had MRI of orbits with without contrast in September 2016 which showed no significant structural abnormality  Over the years, he was seen by neurosurgeon Dr. Deri Fuelling, responding well to gabapentin 300 mg 3 times daily, with medicine, he reported 1-2 out of 10 intermittent pain, recently because the frequency of his radiating pain, gabapentin was increased to 300 mg 4 times a day, he tolerating medication well, there was no significant side effect noted.  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,  Around 2019, he also noticed intermittent left facial twitching, mainly involving muscle around his left eye, to the point of closing his left arm and sometimes, most noticeable during the morning time when he first wake up, and nighttime, sometimes affecting his reading, it presents more than 50% of the time, he denied left facial pain  I personally reviewed MRI orbits with without contrast in September 2016, no acute abnormality, right superior cerebellar artery contact the right trigeminal nerve IV millimeters from pontine surface  UPDATE August 13 2019: This is his first EMG guided Xeomin injection for left hemifacial spasm, related questions were answered.  UPDATE Sept 15 2021: He responded very well to his first EMG guided Xeomin injection for left  hemifacial spasm, it took couple weeks to kick in, there was no significant side effect noted  UPDATE Feb 18 2020: He responded well to previous injection  Update May 26, 2020: He responded very well to previous injection  REVIEW OF SYSTEMS: Full 14 system review of systems performed and notable only for as above All other review of systems were negative.  ALLERGIES: No Known Allergies  HOME MEDICATIONS: Current Outpatient Medications  Medication Sig Dispense Refill  . amLODipine (NORVASC) 5 MG tablet Take 5 mg by mouth daily before breakfast.     . aspirin EC 81 MG tablet Take 81 mg by mouth daily. Swallow whole.    . gabapentin (NEURONTIN) 300 MG capsule Take 2 capsules (600 mg total) by mouth 3 (three) times daily. 540 capsule 3  . incobotulinumtoxinA (XEOMIN) 50 units SOLR injection Inject 50 Units into the muscle every 3 (three) months.    Marland Kitchen ketoconazole (NIZORAL) 2 % cream as needed.    . melatonin 3 MG TABS tablet Take 3 mg by mouth at bedtime as needed.    . Multiple Vitamin (MULTIVITAMIN WITH MINERALS) TABS Take 1 tablet by mouth daily.    . Multiple Vitamins-Minerals (ICAPS AREDS 2 PO) Take 1 capsule by mouth in the morning and at bedtime.    . Multiple Vitamins-Minerals (PRESERVISION AREDS PO) Take 1 capsule by mouth in the morning and at bedtime.    Marland Kitchen olmesartan-hydrochlorothiazide (BENICAR HCT) 40-25 MG per tablet Take 1 tablet by mouth daily before breakfast.     . POTASSIUM GLUCONATE PO Take 1 tablet by mouth daily.    . pravastatin (PRAVACHOL) 10 MG  tablet Take 10 mg by mouth at bedtime.    . sildenafil (REVATIO) 20 MG tablet Take 2-5 tables for ED orally once a day if needed    . traZODone (DESYREL) 100 MG tablet Take 50-100 mg by mouth at bedtime as needed.      Current Facility-Administered Medications  Medication Dose Route Frequency Provider Last Rate Last Admin  . incobotulinumtoxinA (XEOMIN) 50 units injection 50 Units  50 Units Intramuscular Q90 days Marcial Pacas, MD   50 Units at 02/18/20 1423    PAST MEDICAL HISTORY: Past Medical History:  Diagnosis Date  . History of nonmelanoma skin cancer   . Hypercholesteremia   . Hypertension   . Trigeminal neuralgia     PAST SURGICAL HISTORY: Past Surgical History:  Procedure Laterality Date  . HERNIA REPAIR  11/07/11   LIH  . INGUINAL HERNIA REPAIR  11/07/2011   Procedure: LAPAROSCOPIC INGUINAL HERNIA;  Surgeon: Gayland Curry, MD,FACS;  Location: [facility];  Service: General;  Laterality: Left;  . RETINAL DETACHMENT SURGERY  1997  . SKIN CANCER EXCISION  2012, 1999   basal cell - neck and hand    FAMILY HISTORY: Family History  Problem Relation Age of Onset  . Heart disease Father   . Heart disease Mother     SOCIAL HISTORY: Social History   Socioeconomic History  . Marital status: Married    Spouse name: Not on file  . Number of children: Not on file  . Years of education: Not on file  . Highest education level: Not on file  Occupational History  . Not on file  Tobacco Use  . Smoking status: Never Smoker  . Smokeless tobacco: Never Used  Substance and Sexual Activity  . Alcohol use: Yes    Comment: 3 glasses of wine a day  . Drug use: No  . Sexual activity: Not on file  Other Topics Concern  . Not on file  Social History Narrative  . Not on file   Social Determinants of Health   Financial Resource Strain: Not on file  Food Insecurity: Not on file  Transportation Needs: Not on file  Physical Activity: Not on file  Stress: Not on file  Social Connections: Not on file  Intimate Partner Violence: Not on file     PHYSICAL EXAM   There were no vitals filed for this visit. Not recorded     There is no height or weight on file to calculate BMI.  PHYSICAL EXAMNIATION:  He has almost occasionally left orbicularis oculi twitching, also involving left lateral frontalis muscle, left zygomatic major  ASSESSMENT AND PLAN  James Ibarra is a 70 y.o. male   Right  trigeminal neuralgia Left hemifacial spasm  MRI of orbit with without contrast in September 2016 was normal   EMG guided Xeomin injection for left hemifacial spasm, initial ejection was on August 13, 2019  Under EMG guidance, 50 units of Xeomin was dissolved into 1 cc of NS, used 35 units, discard 15 units  Left orbicularis oculi, at 2, 3, 4, 5, 6, 7, 8, (2.5x7= 17.5 units) Left zygomatic major 2.5 units  Right orbicularis oculi 5 units   Left frontalis 5 units Right frontalis 5 units  Will return to clinic in 3 months for repeat injection   Marcial Pacas, M.D. Ph.D.   [facility] [address], [place], [place] [address] Ph: [phone] Fax: [phone]  CC: London Pepper, MD

## 2020-05-31 ENCOUNTER — Telehealth: Payer: Self-pay | Admitting: Neurology

## 2020-05-31 NOTE — Telephone Encounter (Signed)
Yes it is okay to preauthorize Botox 100 units for left hemifacial spasm

## 2020-06-01 NOTE — Telephone Encounter (Signed)
Submitted PA request for 100 units of Botox for hemifacial spasm via [facility]MM. Received approval. Reference #[id number] (06/01/20- 08/23/20).

## 2020-06-01 NOTE — Telephone Encounter (Signed)
I called [facility] [phone]) and spoke with Ilona Sorrel. Advised that I needed to provide prescription for patient. Bernard transferred me to [place] in the pharmacy. I provided her with patient's prescription. Botox (Y4314) 100 units (Q1) for hemifacial spasm (G51.39), left (G51.32) x3 refills, inject every 3 months.

## 2020-06-03 DIAGNOSIS — C4442 Squamous cell carcinoma of skin of scalp and neck: Secondary | ICD-10-CM | POA: Diagnosis not present

## 2020-06-07 NOTE — Telephone Encounter (Signed)
I called [facility] and spoke with Delilah Shan to schedule Dysport delivery. Dysport TBD 4/6.

## 2020-06-09 NOTE — Telephone Encounter (Signed)
Received (1) 100 unit vial of Botox today from [facility].

## 2020-07-13 DIAGNOSIS — D044 Carcinoma in situ of skin of scalp and neck: Secondary | ICD-10-CM | POA: Diagnosis not present

## 2020-07-22 ENCOUNTER — Other Ambulatory Visit: Payer: Self-pay | Admitting: Neurology

## 2020-08-04 NOTE — Telephone Encounter (Signed)
Reeceived request from [facility] to complete ePA for Botox. Completed ePA. Received approval. [id number], approved through 11/04/2020.

## 2020-08-19 NOTE — Telephone Encounter (Signed)
Received (1) 100 unit vial of Botox from [facility] today. This will be for his September injection.

## 2020-08-24 DIAGNOSIS — D044 Carcinoma in situ of skin of scalp and neck: Secondary | ICD-10-CM | POA: Diagnosis not present

## 2020-08-25 ENCOUNTER — Encounter: Payer: Self-pay | Admitting: Neurology

## 2020-08-25 ENCOUNTER — Ambulatory Visit: Payer: Medicare Other | Admitting: Neurology

## 2020-08-25 ENCOUNTER — Other Ambulatory Visit: Payer: Self-pay

## 2020-08-25 VITALS — BP 125/77 | HR 60 | Ht 73.0 in | Wt 157.0 lb

## 2020-08-25 DIAGNOSIS — G5132 Clonic hemifacial spasm, left: Secondary | ICD-10-CM

## 2020-08-25 NOTE — Progress Notes (Signed)
PATIENT: James Ibarra DOB: 11-15-1950  Chief Complaint  Patient presents with   Procedure    Botox     HISTORICAL  SKYLIER Ibarra is a 70 year old male, seen in request by his primary care physician Dr. 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  He has past medical history of hypertension, hyperlipidemia  He reported intermittent right facial pain since 2013, starting from right upper jaw, radiating along his right cheek, intense pain can last 15 seconds, intermittent, he had MRI of orbits with without contrast in September 2016 which showed no significant structural abnormality  Over the years, he was seen by neurosurgeon Dr. Deri Fuelling, responding well to gabapentin 300 mg 3 times daily, with medicine, he reported 1-2 out of 10 intermittent pain, recently because the frequency of his radiating pain, gabapentin was increased to 300 mg 4 times a day, he tolerating medication well, there was no significant side effect noted.  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,  Around 2019, he also noticed intermittent left facial twitching, mainly involving muscle around his left eye, to the point of closing his left arm and sometimes, most noticeable during the morning time when he first wake up, and nighttime, sometimes affecting his reading, it presents more than 50% of the time, he denied left facial pain  I personally reviewed MRI orbits with without contrast in September 2016, no acute abnormality, right superior cerebellar artery contact the right trigeminal nerve IV millimeters from pontine surface  UPDATE August 13 2019: This is his first EMG guided Xeomin injection for left hemifacial spasm, related questions were answered.  UPDATE Sept 15 2021: He responded very well to his first EMG  guided Xeomin injection for left hemifacial spasm, it took couple weeks to kick in, there was no significant side effect noted  UPDATE Feb 18 2020: He responded well to previous injection  Update May 26, 2020: He responded very well to previous injection  UPDATE August 25 2020: He responded well to previous injection, no significant side effect noticed.  REVIEW OF SYSTEMS: Full 14 system review of systems performed and notable only for as above All other review of systems were negative.  ALLERGIES: No Known Allergies  HOME MEDICATIONS: Current Outpatient Medications  Medication Sig Dispense Refill   amLODipine (NORVASC) 5 MG tablet Take 5 mg by mouth daily before breakfast.      botulinum toxin Type A (BOTOX) 100 units SOLR injection Inject 100 Units into the muscle every 3 (three) months.     gabapentin (NEURONTIN) 300 MG capsule TAKE 2 CAPSULES BY MOUTH 3  TIMES DAILY 540 capsule 3   ketoconazole (NIZORAL) 2 % cream as needed.     melatonin 3 MG TABS tablet Take 3 mg by mouth at bedtime as needed.     Multiple Vitamins-Minerals (ICAPS AREDS 2 PO) Take 1 capsule by mouth in the morning and at bedtime.     olmesartan-hydrochlorothiazide (BENICAR HCT) 40-25 MG per tablet Take 1 tablet by mouth daily before breakfast.      POTASSIUM GLUCONATE PO Take 2 tablets by mouth daily.     pravastatin (PRAVACHOL) 10 MG tablet Take 10 mg by mouth at bedtime.     sildenafil (REVATIO) 20 MG tablet Take 2-5 tables for ED orally once a day if needed  traZODone (DESYREL) 100 MG tablet Take 50-100 mg by mouth at bedtime as needed.      No current facility-administered medications for this visit.    PAST MEDICAL HISTORY: Past Medical History:  Diagnosis Date   History of nonmelanoma skin cancer    Hypercholesteremia    Hypertension    Trigeminal neuralgia     PAST SURGICAL HISTORY: Past Surgical History:  Procedure Laterality Date   HERNIA REPAIR  11/07/11   [facility]   INGUINAL HERNIA REPAIR   11/07/2011   Procedure: LAPAROSCOPIC INGUINAL HERNIA;  Surgeon: Gayland Curry, MD,FACS;  Location: [facility];  Service: General;  Laterality: Left;   [place]   SKIN CANCER EXCISION  2012, 1999   basal cell - neck and hand    FAMILY HISTORY: Family History  Problem Relation Age of Onset   Heart disease Father    Heart disease Mother     SOCIAL HISTORY: Social History   Socioeconomic History   Marital status: Married    Spouse name: Not on file   Number of children: Not on file   Years of education: Not on file   Highest education level: Not on file  Occupational History   Not on file  Tobacco Use   Smoking status: Never   Smokeless tobacco: Never  Substance and Sexual Activity   Alcohol use: Yes    Comment: 3 glasses of wine a day   Drug use: No   Sexual activity: Not on file  Other Topics Concern   Not on file  Social History Narrative   Not on file   Social Determinants of Health   Financial Resource Strain: Not on file  Food Insecurity: Not on file  Transportation Needs: Not on file  Physical Activity: Not on file  Stress: Not on file  Social Connections: Not on file  Intimate Partner Violence: Not on file     PHYSICAL EXAM   Vitals:   08/25/20 1339  BP: 125/77  Pulse: 60  Weight: 157 lb (71.2 kg)  Height: 6\' 1"  (1.854 m)   Not recorded     Body mass index is 20.71 kg/m.  PHYSICAL EXAMNIATION:  He has almost occasionally left orbicularis oculi twitching, also involving left lateral frontalis muscle, left zygomatic major  ASSESSMENT AND PLAN  James NEEDLE is a 70 y.o. male   Right trigeminal neuralgia Left hemifacial spasm  MRI of orbit with without contrast in September 2016 was normal   EMG guided Xeomin injection for left hemifacial spasm, initial ejection was on August 13, 2019  Under EMG guidance, 100 units of BOTOX was dissolved into 2 cc of NS, used 35 units, discard 65 units  Left orbicularis oculi, at 2, 3,  4, 5, 6, 7, 8, (2.5x7= 17.5 units)  Right orbicularis oculi 2.5 units x3 at 7,8, 9 (2.5x3=7.5 units)   Left frontalis 5 units Right frontalis 5 units  Will return to clinic in 3 months for repeat injection   Marcial Pacas, M.D. Ph.D.   [facility] [address], [place], [place] [address] Ph: [phone] Fax: [phone]  CC: London Pepper, MD

## 2020-08-25 NOTE — Progress Notes (Signed)
**  Botox 100 units x 1 vial, NDC 7494-4967-59, Lot [id number], Exp 11/2021, specialty pharmacy.//mck,rn**

## 2020-09-21 DIAGNOSIS — L570 Actinic keratosis: Secondary | ICD-10-CM | POA: Diagnosis not present

## 2020-10-20 DIAGNOSIS — E785 Hyperlipidemia, unspecified: Secondary | ICD-10-CM | POA: Diagnosis not present

## 2020-10-20 DIAGNOSIS — R252 Cramp and spasm: Secondary | ICD-10-CM | POA: Diagnosis not present

## 2020-10-20 DIAGNOSIS — G500 Trigeminal neuralgia: Secondary | ICD-10-CM | POA: Diagnosis not present

## 2020-10-20 DIAGNOSIS — G4700 Insomnia, unspecified: Secondary | ICD-10-CM | POA: Diagnosis not present

## 2020-10-20 DIAGNOSIS — I10 Essential (primary) hypertension: Secondary | ICD-10-CM | POA: Diagnosis not present

## 2020-11-22 DIAGNOSIS — L814 Other melanin hyperpigmentation: Secondary | ICD-10-CM | POA: Diagnosis not present

## 2020-11-22 DIAGNOSIS — L570 Actinic keratosis: Secondary | ICD-10-CM | POA: Diagnosis not present

## 2020-11-22 DIAGNOSIS — D225 Melanocytic nevi of trunk: Secondary | ICD-10-CM | POA: Diagnosis not present

## 2020-11-22 DIAGNOSIS — L821 Other seborrheic keratosis: Secondary | ICD-10-CM | POA: Diagnosis not present

## 2020-11-22 DIAGNOSIS — C4442 Squamous cell carcinoma of skin of scalp and neck: Secondary | ICD-10-CM | POA: Diagnosis not present

## 2020-11-22 DIAGNOSIS — D2262 Melanocytic nevi of left upper limb, including shoulder: Secondary | ICD-10-CM | POA: Diagnosis not present

## 2020-11-22 DIAGNOSIS — Z85828 Personal history of other malignant neoplasm of skin: Secondary | ICD-10-CM | POA: Diagnosis not present

## 2020-11-22 DIAGNOSIS — D485 Neoplasm of uncertain behavior of skin: Secondary | ICD-10-CM | POA: Diagnosis not present

## 2020-11-22 DIAGNOSIS — D229 Melanocytic nevi, unspecified: Secondary | ICD-10-CM | POA: Diagnosis not present

## 2020-11-22 DIAGNOSIS — Z08 Encounter for follow-up examination after completed treatment for malignant neoplasm: Secondary | ICD-10-CM | POA: Diagnosis not present

## 2020-11-24 DIAGNOSIS — Z23 Encounter for immunization: Secondary | ICD-10-CM | POA: Diagnosis not present

## 2020-12-01 ENCOUNTER — Ambulatory Visit: Payer: Medicare Other | Admitting: Neurology

## 2020-12-01 ENCOUNTER — Encounter: Payer: Self-pay | Admitting: Neurology

## 2020-12-01 VITALS — BP 161/89 | HR 61 | Ht 73.0 in | Wt 150.0 lb

## 2020-12-01 DIAGNOSIS — G5132 Clonic hemifacial spasm, left: Secondary | ICD-10-CM | POA: Diagnosis not present

## 2020-12-01 NOTE — Progress Notes (Signed)
PATIENT: James Ibarra DOB: 1951/02/25  Chief Complaint  Patient presents with   Procedure    Botox     HISTORICAL  James Ibarra is a 70 year old male, seen in request by his primary care physician Dr. 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  He has past medical history of hypertension, hyperlipidemia  He reported intermittent right facial pain since 2013, starting from right upper jaw, radiating along his right cheek, intense pain can last 15 seconds, intermittent, he had MRI of orbits with without contrast in September 2016 which showed no significant structural abnormality  Over the years, he was seen by neurosurgeon Dr. Deri Fuelling, responding well to gabapentin 300 mg 3 times daily, with medicine, he reported 1-2 out of 10 intermittent pain, recently because the frequency of his radiating pain, gabapentin was increased to 300 mg 4 times a day, he tolerating medication well, there was no significant side effect noted.  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,  Around 2019, he also noticed intermittent left facial twitching, mainly involving muscle around his left eye, to the point of closing his left arm and sometimes, most noticeable during the morning time when he first wake up, and nighttime, sometimes affecting his reading, it presents more than 50% of the time, he denied left facial pain  I personally reviewed MRI orbits with without contrast in September 2016, no acute abnormality, right superior cerebellar artery contact the right trigeminal nerve IV millimeters from pontine surface  UPDATE August 13 2019: This is his first EMG guided Xeomin injection for left hemifacial spasm, related questions were answered.  UPDATE Sept 15 2021: He responded very well to his first EMG  guided Xeomin injection for left hemifacial spasm, it took couple weeks to kick in, there was no significant side effect noted  UPDATE Feb 18 2020: He responded well to previous injection  Update May 26, 2020: He responded very well to previous injection  UPDATE August 25 2020: He responded well to previous injection, no significant side effect noticed.  UPDATE Sept 28 2022: He responded very well to previous injection, there was no significant side effect noted  REVIEW OF SYSTEMS: Full 14 system review of systems performed and notable only for as above All other review of systems were negative.  ALLERGIES: No Known Allergies  HOME MEDICATIONS: Current Outpatient Medications  Medication Sig Dispense Refill   amLODipine (NORVASC) 5 MG tablet Take 5 mg by mouth daily before breakfast.      botulinum toxin Type A (BOTOX) 100 units SOLR injection Inject 100 Units into the muscle every 3 (three) months.     gabapentin (NEURONTIN) 300 MG capsule TAKE 2 CAPSULES BY MOUTH 3  TIMES DAILY 540 capsule 3   ketoconazole (NIZORAL) 2 % cream as needed.     melatonin 3 MG TABS tablet Take 3 mg by mouth at bedtime as needed.     Multiple Vitamins-Minerals (ICAPS AREDS 2 PO) Take 1 capsule by mouth in the morning and at bedtime.     olmesartan-hydrochlorothiazide (BENICAR HCT) 40-25 MG per tablet Take 1 tablet by mouth daily before breakfast.      POTASSIUM GLUCONATE PO Take 2 tablets by mouth daily.     pravastatin (PRAVACHOL) 10 MG tablet Take 10 mg by mouth at bedtime.  sildenafil (REVATIO) 20 MG tablet Take 2-5 tables for ED orally once a day if needed     traZODone (DESYREL) 100 MG tablet Take 50-100 mg by mouth at bedtime as needed.      No current facility-administered medications for this visit.    PAST MEDICAL HISTORY: Past Medical History:  Diagnosis Date   History of nonmelanoma skin cancer    Hypercholesteremia    Hypertension    Trigeminal neuralgia     PAST SURGICAL  HISTORY: Past Surgical History:  Procedure Laterality Date   HERNIA REPAIR  11/07/11   [facility]   INGUINAL HERNIA REPAIR  11/07/2011   Procedure: LAPAROSCOPIC INGUINAL HERNIA;  Surgeon: Gayland Curry, MD,FACS;  Location: [facility];  Service: General;  Laterality: Left;   [place]   SKIN CANCER EXCISION  2012, 1999   basal cell - neck and hand    FAMILY HISTORY: Family History  Problem Relation Age of Onset   Heart disease Father    Heart disease Mother     SOCIAL HISTORY: Social History   Socioeconomic History   Marital status: Married    Spouse name: Not on file   Number of children: Not on file   Years of education: Not on file   Highest education level: Not on file  Occupational History   Not on file  Tobacco Use   Smoking status: Never   Smokeless tobacco: Never  Substance and Sexual Activity   Alcohol use: Yes    Comment: 3 glasses of wine a day   Drug use: No   Sexual activity: Not on file  Other Topics Concern   Not on file  Social History Narrative   Not on file   Social Determinants of Health   Financial Resource Strain: Not on file  Food Insecurity: Not on file  Transportation Needs: Not on file  Physical Activity: Not on file  Stress: Not on file  Social Connections: Not on file  Intimate Partner Violence: Not on file     PHYSICAL EXAM   Vitals:   12/01/20 1335  BP: (!) 161/89  Pulse: 61  Weight: 150 lb (68 kg)  Height: 6\' 1"  (1.854 m)   Not recorded     Body mass index is 19.79 kg/m.  PHYSICAL EXAMNIATION:  He has occasionally left orbicularis oculi twitching, also involving left lateral frontalis muscle, left zygomatic major muscles  ASSESSMENT AND PLAN  James Ibarra is a 70 y.o. male   Right trigeminal neuralgia Left hemifacial spasm  MRI of orbit with without contrast in September 2016 was normal.   EMG guided Botox injection for left hemifacial spasm, initial ejection was on August 13, 2019  Under EMG  guidance, 100 units of BOTOX was dissolved into 2 cc of NS, used 32.5 units, discard 67.5 units  Left orbicularis oculi, at 2, 3, 4, 5, 6, 7, 8, (2.5x7= 17.5 units)  Right orbicularis oculi 2.5 units x3 at 7,8, 9 (2.5x3=7.5 units)  Left procerus 2.5 units Right procerus 2.5  units Corrugate 2.5 units  Will return to clinic in 3 months for repeat injection   Marcial Pacas, M.D. Ph.D.   [facility] [address], [place], [place] [address] Ph: [phone] Fax: [phone]  CC: London Pepper, MD

## 2020-12-01 NOTE — Progress Notes (Signed)
**  Botox 100 units x 1 vials, NDC 6825-7493-55, Lot [id number], Exp 07/2022, specialty pharmacy.//mck,rn**

## 2021-01-10 DIAGNOSIS — D0439 Carcinoma in situ of skin of other parts of face: Secondary | ICD-10-CM | POA: Diagnosis not present

## 2021-02-10 DIAGNOSIS — L905 Scar conditions and fibrosis of skin: Secondary | ICD-10-CM | POA: Diagnosis not present

## 2021-02-10 DIAGNOSIS — L578 Other skin changes due to chronic exposure to nonionizing radiation: Secondary | ICD-10-CM | POA: Diagnosis not present

## 2021-02-10 DIAGNOSIS — L570 Actinic keratosis: Secondary | ICD-10-CM | POA: Diagnosis not present

## 2021-03-12 ENCOUNTER — Encounter: Payer: Self-pay | Admitting: Neurology

## 2021-03-15 NOTE — Telephone Encounter (Signed)
Patient's Botox appointment is tomorrow. I submitted PA request for Botox to [facility] Medicare via [place] portal. Requested 100 units of Botox (V3748) every 12 weeks for G51.32. Pending Josem Kaufmann #[id number].

## 2021-03-16 ENCOUNTER — Other Ambulatory Visit: Payer: Self-pay

## 2021-03-16 ENCOUNTER — Ambulatory Visit: Payer: Medicare Other | Admitting: Neurology

## 2021-03-16 VITALS — BP 161/84 | HR 61 | Ht 73.0 in | Wt 150.0 lb

## 2021-03-16 DIAGNOSIS — G5132 Clonic hemifacial spasm, left: Secondary | ICD-10-CM | POA: Diagnosis not present

## 2021-03-16 MED ORDER — ONABOTULINUMTOXINA 100 UNITS IJ SOLR: 100.0000 [IU] | Freq: Once | INTRAMUSCULAR | Status: DC

## 2021-03-16 NOTE — Progress Notes (Signed)
Botox 100 units,  [id number] Exp-10/2022 [id number] SP

## 2021-03-16 NOTE — Telephone Encounter (Signed)
Received approval from [facility] Medicare. [place] #[id number] (03/15/21- 03/15/22).

## 2021-03-16 NOTE — Progress Notes (Signed)
PATIENT: James Ibarra DOB: 1950-05-07  Chief Complaint  Patient presents with   Procedure    Botox room [place] is a 71 year old male, seen in request by his primary care physician Dr. 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  He has past medical history of hypertension, hyperlipidemia  He reported intermittent right facial pain since 2013, starting from right upper jaw, radiating along his right cheek, intense pain can last 15 seconds, intermittent, he had MRI of orbits with without contrast in September 2016 which showed no significant structural abnormality  Over the years, he was seen by neurosurgeon Dr. Deri Fuelling, responding well to gabapentin 300 mg 3 times daily, with medicine, he reported 1-2 out of 10 intermittent pain, recently because the frequency of his radiating pain, gabapentin was increased to 300 mg 4 times a day, he tolerating medication well, there was no significant side effect noted.  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,  Around 2019, he also noticed intermittent left facial twitching, mainly involving muscle around his left eye, to the point of closing his left arm and sometimes, most noticeable during the morning time when he first wake up, and nighttime, sometimes affecting his reading, it presents more than 50% of the time, he denied left facial pain  I personally reviewed MRI orbits with without contrast in September 2016, no acute abnormality, right superior cerebellar artery contact the right trigeminal nerve IV millimeters from pontine surface  UPDATE August 13 2019: This is his first EMG guided Xeomin injection for left hemifacial spasm, related questions were answered.  UPDATE Sept 15 2021: He responded very well to his first  EMG guided Xeomin injection for left hemifacial spasm, it took couple weeks to kick in, there was no significant side effect noted  UPDATE Feb 18 2020: He responded well to previous injection  Update May 26, 2020: He responded very well to previous injection  UPDATE August 25 2020: He responded well to previous injection, no significant side effect noticed.  UPDATE Sept 28 2022: He responded very well to previous injection, there was no significant side effect noted Update March 16, 2021, He responded well to previous injection, on the recent field weeks, noticed recurrent left facial twitching,  REVIEW OF SYSTEMS: Full 14 system review of systems performed and notable only for as above All other review of systems were negative.  ALLERGIES: No Known Allergies  HOME MEDICATIONS: Current Outpatient Medications  Medication Sig Dispense Refill   amLODipine (NORVASC) 5 MG tablet Take 5 mg by mouth daily before breakfast.      botulinum toxin Type A (BOTOX) 100 units SOLR injection Inject 100 Units into the muscle every 3 (three) months.     gabapentin (NEURONTIN) 300 MG capsule TAKE 2 CAPSULES BY MOUTH 3  TIMES DAILY 540 capsule 3   ketoconazole (NIZORAL) 2 % cream as needed.     melatonin 3 MG TABS tablet Take 3 mg by mouth at bedtime as needed.     Multiple Vitamins-Minerals (ICAPS AREDS 2 PO) Take 1 capsule by mouth in the morning and at bedtime.     olmesartan-hydrochlorothiazide (BENICAR HCT) 40-25 MG per tablet Take 1 tablet by mouth daily before breakfast.      POTASSIUM GLUCONATE PO Take  2 tablets by mouth daily.     pravastatin (PRAVACHOL) 10 MG tablet Take 10 mg by mouth at bedtime.     sildenafil (REVATIO) 20 MG tablet Take 2-5 tables for ED orally once a day if needed     traZODone (DESYREL) 100 MG tablet Take 50-100 mg by mouth at bedtime as needed.      No current facility-administered medications for this visit.    PAST MEDICAL HISTORY: Past Medical History:   Diagnosis Date   History of nonmelanoma skin cancer    Hypercholesteremia    Hypertension    Trigeminal neuralgia     PAST SURGICAL HISTORY: Past Surgical History:  Procedure Laterality Date   HERNIA REPAIR  11/07/11   [facility]   INGUINAL HERNIA REPAIR  11/07/2011   Procedure: LAPAROSCOPIC INGUINAL HERNIA;  Surgeon: Gayland Curry, MD,FACS;  Location: [facility];  Service: General;  Laterality: Left;   [place]   SKIN CANCER EXCISION  2012, 1999   basal cell - neck and hand    FAMILY HISTORY: Family History  Problem Relation Age of Onset   Heart disease Father    Heart disease Mother     SOCIAL HISTORY: Social History   Socioeconomic History   Marital status: Married    Spouse name: Not on file   Number of children: Not on file   Years of education: Not on file   Highest education level: Not on file  Occupational History   Not on file  Tobacco Use   Smoking status: Never   Smokeless tobacco: Never  Substance and Sexual Activity   Alcohol use: Yes    Comment: 3 glasses of wine a day   Drug use: No   Sexual activity: Not on file  Other Topics Concern   Not on file  Social History Narrative   Not on file   Social Determinants of Health   Financial Resource Strain: Not on file  Food Insecurity: Not on file  Transportation Needs: Not on file  Physical Activity: Not on file  Stress: Not on file  Social Connections: Not on file  Intimate Partner Violence: Not on file     PHYSICAL EXAM   Vitals:   03/16/21 1357  BP: (!) 161/84  Pulse: 61  Weight: 150 lb (68 kg)  Height: 6\' 1"  (1.854 m)   Not recorded     Body mass index is 19.79 kg/m.  PHYSICAL EXAMNIATION:  He has occasionally left orbicularis oculi twitching, also involving left lateral frontalis muscle, left zygomatic major muscles  ASSESSMENT AND PLAN  James Ibarra is a 71 y.o. male   Right trigeminal neuralgia Left hemifacial spasm  MRI of orbit with without contrast in  September 2016 was normal.   EMG guided Botox injection for left hemifacial spasm, initial ejection was on August 13, 2019  Under EMG guidance, 100 units of BOTOX was dissolved into 2 cc of NS, used 37.5 units, discard 62.5 units  Left orbicularis oculi, at 2, 3, 4, 5, 6, 7, 8, (2.5x7= 17.5 units)  Right orbicularis oculi 2.5 units x3 at 7,8, 9 (2.5x3=7.5 units)  Left procerus 2.5 units Right procerus 2.5  units Corrugate 2.5 units  Left frontalis 2.5 units Right frontalis 2.5 units  Will return to clinic in 3 months for repeat injection   Marcial Pacas, M.D. Ph.D.   [facility] [address] [place], [place] [address] Ph: [phone] Fax: [phone]  CC: London Pepper,  MD--------------------------------------------------------------------------------------------------------------------------------

## 2021-04-12 DIAGNOSIS — D3131 Benign neoplasm of right choroid: Secondary | ICD-10-CM | POA: Diagnosis not present

## 2021-04-12 DIAGNOSIS — H31092 Other chorioretinal scars, left eye: Secondary | ICD-10-CM | POA: Diagnosis not present

## 2021-04-12 DIAGNOSIS — Z961 Presence of intraocular lens: Secondary | ICD-10-CM | POA: Diagnosis not present

## 2021-04-12 DIAGNOSIS — H02831 Dermatochalasis of right upper eyelid: Secondary | ICD-10-CM | POA: Diagnosis not present

## 2021-04-12 DIAGNOSIS — H04123 Dry eye syndrome of bilateral lacrimal glands: Secondary | ICD-10-CM | POA: Diagnosis not present

## 2021-04-12 DIAGNOSIS — H26491 Other secondary cataract, right eye: Secondary | ICD-10-CM | POA: Diagnosis not present

## 2021-04-12 DIAGNOSIS — H353122 Nonexudative age-related macular degeneration, left eye, intermediate dry stage: Secondary | ICD-10-CM | POA: Diagnosis not present

## 2021-04-12 DIAGNOSIS — H33002 Unspecified retinal detachment with retinal break, left eye: Secondary | ICD-10-CM | POA: Diagnosis not present

## 2021-04-12 DIAGNOSIS — H02834 Dermatochalasis of left upper eyelid: Secondary | ICD-10-CM | POA: Diagnosis not present

## 2021-04-12 DIAGNOSIS — G245 Blepharospasm: Secondary | ICD-10-CM | POA: Diagnosis not present

## 2021-05-19 ENCOUNTER — Encounter: Payer: Self-pay | Admitting: Neurology

## 2021-05-19 DIAGNOSIS — M25569 Pain in unspecified knee: Secondary | ICD-10-CM | POA: Diagnosis not present

## 2021-05-19 DIAGNOSIS — I10 Essential (primary) hypertension: Secondary | ICD-10-CM | POA: Diagnosis not present

## 2021-05-19 DIAGNOSIS — R202 Paresthesia of skin: Secondary | ICD-10-CM | POA: Diagnosis not present

## 2021-05-19 DIAGNOSIS — G4700 Insomnia, unspecified: Secondary | ICD-10-CM | POA: Diagnosis not present

## 2021-05-19 DIAGNOSIS — G500 Trigeminal neuralgia: Secondary | ICD-10-CM | POA: Diagnosis not present

## 2021-05-19 DIAGNOSIS — Z0000 Encounter for general adult medical examination without abnormal findings: Secondary | ICD-10-CM | POA: Diagnosis not present

## 2021-05-19 DIAGNOSIS — E785 Hyperlipidemia, unspecified: Secondary | ICD-10-CM | POA: Diagnosis not present

## 2021-05-19 DIAGNOSIS — R253 Fasciculation: Secondary | ICD-10-CM | POA: Diagnosis not present

## 2021-05-24 ENCOUNTER — Telehealth: Payer: Self-pay | Admitting: Neurology

## 2021-05-24 MED ORDER — BOTOX 100 UNITS IJ SOLR: 100.0000 [IU] | INTRAMUSCULAR | 0 refills | Status: DC

## 2021-05-24 NOTE — Telephone Encounter (Signed)
Please send Botox Rx refill to [facility]. ?

## 2021-05-24 NOTE — Telephone Encounter (Signed)
Botox refill sent to [facility]. ?

## 2021-06-03 ENCOUNTER — Telehealth: Payer: Self-pay | Admitting: Neurology

## 2021-06-03 NOTE — Telephone Encounter (Signed)
Patient called answer phone service to state that MD has to call [facility] for approval. Doesn't state name of medication. ?I called him and it is for BOTOX 06-08-2021. Please make sure the medication is approved before. CD ?

## 2021-06-07 NOTE — Telephone Encounter (Signed)
Received (1) 100 unit vial of Botox from [facility]. 

## 2021-06-08 ENCOUNTER — Encounter: Payer: Self-pay | Admitting: Neurology

## 2021-06-08 ENCOUNTER — Ambulatory Visit: Payer: Medicare Other | Admitting: Neurology

## 2021-06-08 ENCOUNTER — Other Ambulatory Visit: Payer: Self-pay | Admitting: Neurology

## 2021-06-08 VITALS — BP 132/71 | HR 63 | Ht 73.0 in | Wt 159.5 lb

## 2021-06-08 DIAGNOSIS — G5132 Clonic hemifacial spasm, left: Secondary | ICD-10-CM

## 2021-06-08 NOTE — Progress Notes (Signed)
? ? ?PATIENT: James Ibarra ?DOB: February 12, 1951 ? ?Chief Complaint  ?Patient presents with  ? Procedure  ?  Room 14 - alone. Botox injections.  ?  ? ?HISTORICAL ? ?James Ibarra is a 71 year old male, seen in request by his primary care physician Dr. 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 ?I have reviewed and summarized the referring note from the referring physician.  He has past medical history of hypertension, hyperlipidemia ? ?He reported intermittent right facial pain since 2013, starting from right upper jaw, radiating along his right cheek, intense pain can last 15 seconds, intermittent, he had MRI of orbits with without contrast in September 2016 which showed no significant structural abnormality ? ?Over the years, he was seen by neurosurgeon Dr. Deri Fuelling, responding well to gabapentin 300 mg 3 times daily, with medicine, he reported 1-2 out of 10 intermittent pain, recently because the frequency of his radiating pain, gabapentin was increased to 300 mg 4 times a day, he tolerating medication well, there was no significant side effect noted.  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 ?Around 2019, he also noticed intermittent left facial twitching, mainly involving muscle around his left eye, to the point of closing his left arm and sometimes, most noticeable during the morning time when he first wake up, and nighttime, sometimes affecting his reading, it presents more than 50% of the time, he denied left facial pain ? ?I personally reviewed MRI orbits with without contrast in September 2016, no acute abnormality, right superior cerebellar artery contact the right trigeminal nerve IV millimeters from pontine surface ? ?UPDATE August 13 2019: ?This is his first EMG guided Xeomin injection for left hemifacial spasm, related questions were answered. ? ?UPDATE Sept 15 2021: ?He responded  very well to his first EMG guided Xeomin injection for left hemifacial spasm, it took couple weeks to kick in, there was no significant side effect noted ? ?UPDATE Feb 18 2020: ?He responded well to previous injection ? ?Update May 26, 2020: ?He responded very well to previous injection ? ?UPDATE August 25 2020: ?He responded well to previous injection, no significant side effect noticed. ? ?UPDATE Sept 28 2022: ?He responded very well to previous injection, there was no significant side effect noted ? ?Update March 16, 2021, ?He responded well to previous injection, on the recent field weeks, noticed recurrent left facial twitching, ? ?Update June 09, 2021: ?He responded well to previous injection, recent flareup of right trigeminal pain, involving right V2 branch ? ?PHYSICAL EXAM ?  ?Vitals:  ? 06/08/21 1505  ?BP: 132/71  ?Pulse: 63  ?Weight: 159 lb 8 oz (72.3 kg)  ?Height: '6\' 1"'$  (1.854 m)  ? ?Not recorded ?  ? ? ?Body mass index is 21.04 kg/m?. ? ?PHYSICAL EXAMNIATION: ? He has occasionally left orbicularis oculi twitching, also involving left lateral frontalis muscle, left zygomatic major muscles ? ?ASSESSMENT AND PLAN ? ?James Ibarra is a 71 y.o. male   ?Right trigeminal neuralgia ?Left hemifacial spasm ? MRI of orbit with without contrast in September 2016 was normal. ?  ?EMG guided Botox injection for left hemifacial spasm, initial ejection was on August 13, 2019 ? ?Under EMG guidance, 100 units of BOTOX was dissolved into 2 cc of NS, used 37.5 units, discard 62.5 units ? ?Left orbicularis oculi, at 2, 3, 4, 5, 6, 7, 8, (2.5x7= 17.5 units) ? ?  Right orbicularis oculi 2.5 units x3 at 7,8, 9, 6, 5 (2.5x5=12.5 units) ? ?Left procerus 2.5 units ?Right procerus 2.5  units ?Corrugate 2.5 units ? ? ?Will return to clinic in 3 months for repeat injection ? ? ?James Ibarra, M.D. Ph.D. ? ? ?[facility] ?[address] ?[place], [place] [address] ?Ph: [phone] ?Fax: [phone] ? ?CC:  London Pepper, MD-------------------------------------------------------------------------------------------------------------------------------- ?

## 2021-06-08 NOTE — Progress Notes (Signed)
**  Botox 100 units x 1 vial, NDC 4627-0350-09, Lot [id number], Exp 11/2023, specialty pharmacy.//mck,rn** ?

## 2021-06-10 ENCOUNTER — Other Ambulatory Visit: Payer: Self-pay | Admitting: Neurology

## 2021-06-24 ENCOUNTER — Other Ambulatory Visit: Payer: Self-pay | Admitting: Neurology

## 2021-07-05 DIAGNOSIS — L821 Other seborrheic keratosis: Secondary | ICD-10-CM | POA: Diagnosis not present

## 2021-07-05 DIAGNOSIS — D485 Neoplasm of uncertain behavior of skin: Secondary | ICD-10-CM | POA: Diagnosis not present

## 2021-07-05 DIAGNOSIS — C4442 Squamous cell carcinoma of skin of scalp and neck: Secondary | ICD-10-CM | POA: Diagnosis not present

## 2021-07-05 DIAGNOSIS — Z08 Encounter for follow-up examination after completed treatment for malignant neoplasm: Secondary | ICD-10-CM | POA: Diagnosis not present

## 2021-07-05 DIAGNOSIS — Z85828 Personal history of other malignant neoplasm of skin: Secondary | ICD-10-CM | POA: Diagnosis not present

## 2021-07-05 DIAGNOSIS — L578 Other skin changes due to chronic exposure to nonionizing radiation: Secondary | ICD-10-CM | POA: Diagnosis not present

## 2021-07-05 DIAGNOSIS — C44529 Squamous cell carcinoma of skin of other part of trunk: Secondary | ICD-10-CM | POA: Diagnosis not present

## 2021-07-05 DIAGNOSIS — D225 Melanocytic nevi of trunk: Secondary | ICD-10-CM | POA: Diagnosis not present

## 2021-07-05 DIAGNOSIS — R229 Localized swelling, mass and lump, unspecified: Secondary | ICD-10-CM | POA: Diagnosis not present

## 2021-07-05 DIAGNOSIS — L570 Actinic keratosis: Secondary | ICD-10-CM | POA: Diagnosis not present

## 2021-07-27 DIAGNOSIS — L578 Other skin changes due to chronic exposure to nonionizing radiation: Secondary | ICD-10-CM | POA: Diagnosis not present

## 2021-07-27 DIAGNOSIS — L244 Irritant contact dermatitis due to drugs in contact with skin: Secondary | ICD-10-CM | POA: Diagnosis not present

## 2021-07-27 DIAGNOSIS — C44529 Squamous cell carcinoma of skin of other part of trunk: Secondary | ICD-10-CM | POA: Diagnosis not present

## 2021-09-07 ENCOUNTER — Ambulatory Visit: Payer: Medicare Other | Admitting: Neurology

## 2021-09-07 ENCOUNTER — Encounter: Payer: Self-pay | Admitting: Neurology

## 2021-09-07 VITALS — BP 130/86 | HR 74 | Ht 73.0 in | Wt 159.0 lb

## 2021-09-07 DIAGNOSIS — G5132 Clonic hemifacial spasm, left: Secondary | ICD-10-CM | POA: Diagnosis not present

## 2021-09-07 NOTE — Progress Notes (Signed)
BOTOX 100 units x 1 vial  Ndc [phone] [id number] Exp-2024-01  SP

## 2021-09-07 NOTE — Progress Notes (Addendum)
    PATIENT: James Ibarra DOB: Mar 27, 1950  Chief Complaint  Patient presents with   Procedure    Botox 100 units  Rm 14, doing well with botox      HISTORICAL  James Ibarra is a 71 year old male, seen in request by his primary care physician Dr. 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  He has past medical history of hypertension, hyperlipidemia  He reported intermittent right facial pain since 2013, starting from right upper jaw, radiating along his right cheek, intense pain can last 15 seconds, intermittent, he had MRI of orbits with without contrast in September 2016 which showed no significant structural abnormality  Over the years, he was seen by neurosurgeon Dr. Deri Fuelling, responding well to gabapentin 300 mg, at baseline he was taking 2 tablets 3 times a day, he reported 1-2 out of 10 intermittent pain, during flareup, he will take higher dose  Around 2019, he also noticed intermittent left facial twitching, mainly involving muscle around his left eye, to the point of closing his left eye sometimes, most noticeable during the morning time when he first wake up, and nighttime, sometimes affecting his reading, it presents more than 50% of the time, he denied left facial pain  MRI orbits with without contrast in September 2016, no acute abnormality, right superior cerebellar artery contact the right trigeminal nerve IV millimeters from pontine surface  He began to receive EMG guided low-dose xeomin injection since June 2021, responding well, come back every 3 to 4 months, last injection was July 2023,  PHYSICAL EXAM   Vitals:   09/07/21 1508  BP: 130/86  Pulse: 74  Weight: 159 lb (72.1 kg)  Height: '6\' 1"'$  (1.854 m)   Not recorded     Body mass index is 20.98 kg/m.  PHYSICAL EXAMNIATION:  He has occasionally left  orbicularis oculi twitching, also involving left lateral frontalis muscle, left zygomatic major muscles  ASSESSMENT AND PLAN  James Ibarra is a 71 y.o. male   Right trigeminal neuralgia Left hemifacial spasm  MRI of orbit with without contrast in September 2016 was normal.   EMG guided Botox injection for left hemifacial spasm, initial ejection was on August 13, 2019  Under EMG guidance, 100 units of BOTOX was dissolved into 2 cc of NS, used 25 units, discard 75 units  Left orbicularis oculi, at 2, 3, 4, 5, 6, 7, 8, (2.5x7= 17.5 units)  Right orbicularis oculi 2.5 units x3 at 7,8, 9,  (2.5x3=7.5 units)   Will return to clinic in 3 months for repeat injection   Marcial Pacas, M.D. Ph.D.   [facility] [address], [place], [place] [address] Ph: [phone] Fax: [phone]  CC: London Pepper, MD--------------------------------------------------------------------------------------------------------------------------------

## 2021-11-22 DIAGNOSIS — G500 Trigeminal neuralgia: Secondary | ICD-10-CM | POA: Diagnosis not present

## 2021-11-22 DIAGNOSIS — I10 Essential (primary) hypertension: Secondary | ICD-10-CM | POA: Diagnosis not present

## 2021-11-22 DIAGNOSIS — E785 Hyperlipidemia, unspecified: Secondary | ICD-10-CM | POA: Diagnosis not present

## 2021-11-22 DIAGNOSIS — M79675 Pain in left toe(s): Secondary | ICD-10-CM | POA: Diagnosis not present

## 2021-11-22 DIAGNOSIS — Z23 Encounter for immunization: Secondary | ICD-10-CM | POA: Diagnosis not present

## 2021-11-23 ENCOUNTER — Encounter: Payer: Self-pay | Admitting: Neurology

## 2021-11-29 ENCOUNTER — Telehealth: Payer: Self-pay

## 2021-11-29 NOTE — Telephone Encounter (Signed)
Please check with patient, wife confirmed he does not need appointment for tomorrow September 27, please fill with a new patient

## 2021-11-29 NOTE — Telephone Encounter (Signed)
Informed patient of cancellation via mychart.

## 2021-11-29 NOTE — Telephone Encounter (Signed)
I left a VM for the patient to return our call.

## 2021-11-29 NOTE — Telephone Encounter (Signed)
The patient has a botox appointment tomorrow 9/27 so I called [facility] to set up delivery. I had to do a verbal prescription with a pharmacists and got a prescription for 100 units of botox every 3 months with 3 refills. While I was on the phone they put me on hold to call the patient to schedule delivery and collect copay. He told [facility] that he is no longer doing botox and his symptoms have resolved. He said he will call the office to discuss. I did not speak with him. His appointment tomorrow will need to be cancelled or converted to an office visit if he still wants to come.

## 2021-11-29 NOTE — Telephone Encounter (Signed)
I called [facility] to schedule delivery of patient's botox. I spoke with [place]. Apparently, Tori H called them today and cancelled the botox and reported that the patient will no longer be on it.

## 2021-11-29 NOTE — Telephone Encounter (Signed)
I didn't cancel it the patient did, I sent it to pod 2 in another phone note

## 2021-11-30 ENCOUNTER — Ambulatory Visit: Payer: Medicare Other | Admitting: Neurology

## 2021-12-02 ENCOUNTER — Encounter: Payer: Self-pay | Admitting: Neurology

## 2021-12-05 ENCOUNTER — Other Ambulatory Visit: Payer: Self-pay

## 2021-12-05 MED ORDER — GABAPENTIN 300 MG PO CAPS: ORAL_CAPSULE | ORAL | 3 refills | Status: DC

## 2021-12-05 NOTE — Progress Notes (Signed)
Rx updated to reflect most recent dosage change.

## 2022-01-05 DIAGNOSIS — L578 Other skin changes due to chronic exposure to nonionizing radiation: Secondary | ICD-10-CM | POA: Diagnosis not present

## 2022-01-05 DIAGNOSIS — R229 Localized swelling, mass and lump, unspecified: Secondary | ICD-10-CM | POA: Diagnosis not present

## 2022-01-05 DIAGNOSIS — L814 Other melanin hyperpigmentation: Secondary | ICD-10-CM | POA: Diagnosis not present

## 2022-01-05 DIAGNOSIS — D229 Melanocytic nevi, unspecified: Secondary | ICD-10-CM | POA: Diagnosis not present

## 2022-01-05 DIAGNOSIS — C4442 Squamous cell carcinoma of skin of scalp and neck: Secondary | ICD-10-CM | POA: Diagnosis not present

## 2022-01-05 DIAGNOSIS — Z85828 Personal history of other malignant neoplasm of skin: Secondary | ICD-10-CM | POA: Diagnosis not present

## 2022-01-05 DIAGNOSIS — L821 Other seborrheic keratosis: Secondary | ICD-10-CM | POA: Diagnosis not present

## 2022-01-05 DIAGNOSIS — Z08 Encounter for follow-up examination after completed treatment for malignant neoplasm: Secondary | ICD-10-CM | POA: Diagnosis not present

## 2022-01-05 DIAGNOSIS — L570 Actinic keratosis: Secondary | ICD-10-CM | POA: Diagnosis not present

## 2022-03-07 DIAGNOSIS — L578 Other skin changes due to chronic exposure to nonionizing radiation: Secondary | ICD-10-CM | POA: Diagnosis not present

## 2022-03-07 DIAGNOSIS — L570 Actinic keratosis: Secondary | ICD-10-CM | POA: Diagnosis not present

## 2022-04-18 DIAGNOSIS — Z961 Presence of intraocular lens: Secondary | ICD-10-CM | POA: Diagnosis not present

## 2022-04-18 DIAGNOSIS — H31092 Other chorioretinal scars, left eye: Secondary | ICD-10-CM | POA: Diagnosis not present

## 2022-04-18 DIAGNOSIS — D3131 Benign neoplasm of right choroid: Secondary | ICD-10-CM | POA: Diagnosis not present

## 2022-04-18 DIAGNOSIS — H04123 Dry eye syndrome of bilateral lacrimal glands: Secondary | ICD-10-CM | POA: Diagnosis not present

## 2022-04-18 DIAGNOSIS — H33002 Unspecified retinal detachment with retinal break, left eye: Secondary | ICD-10-CM | POA: Diagnosis not present

## 2022-04-18 DIAGNOSIS — G245 Blepharospasm: Secondary | ICD-10-CM | POA: Diagnosis not present

## 2022-04-18 DIAGNOSIS — H353122 Nonexudative age-related macular degeneration, left eye, intermediate dry stage: Secondary | ICD-10-CM | POA: Diagnosis not present

## 2022-04-18 DIAGNOSIS — H02831 Dermatochalasis of right upper eyelid: Secondary | ICD-10-CM | POA: Diagnosis not present

## 2022-04-18 DIAGNOSIS — H02834 Dermatochalasis of left upper eyelid: Secondary | ICD-10-CM | POA: Diagnosis not present

## 2022-05-08 DIAGNOSIS — D485 Neoplasm of uncertain behavior of skin: Secondary | ICD-10-CM | POA: Diagnosis not present

## 2022-05-08 DIAGNOSIS — C4442 Squamous cell carcinoma of skin of scalp and neck: Secondary | ICD-10-CM | POA: Diagnosis not present

## 2022-05-08 DIAGNOSIS — L821 Other seborrheic keratosis: Secondary | ICD-10-CM | POA: Diagnosis not present

## 2022-05-31 DIAGNOSIS — C4442 Squamous cell carcinoma of skin of scalp and neck: Secondary | ICD-10-CM | POA: Diagnosis not present

## 2022-05-31 DIAGNOSIS — D044 Carcinoma in situ of skin of scalp and neck: Secondary | ICD-10-CM | POA: Diagnosis not present

## 2022-05-31 DIAGNOSIS — D485 Neoplasm of uncertain behavior of skin: Secondary | ICD-10-CM | POA: Diagnosis not present

## 2022-06-01 DIAGNOSIS — G4700 Insomnia, unspecified: Secondary | ICD-10-CM | POA: Diagnosis not present

## 2022-06-01 DIAGNOSIS — R202 Paresthesia of skin: Secondary | ICD-10-CM | POA: Diagnosis not present

## 2022-06-01 DIAGNOSIS — I10 Essential (primary) hypertension: Secondary | ICD-10-CM | POA: Diagnosis not present

## 2022-06-01 DIAGNOSIS — E785 Hyperlipidemia, unspecified: Secondary | ICD-10-CM | POA: Diagnosis not present

## 2022-06-01 DIAGNOSIS — Z0000 Encounter for general adult medical examination without abnormal findings: Secondary | ICD-10-CM | POA: Diagnosis not present

## 2022-06-01 DIAGNOSIS — R011 Cardiac murmur, unspecified: Secondary | ICD-10-CM | POA: Diagnosis not present

## 2022-06-01 DIAGNOSIS — G500 Trigeminal neuralgia: Secondary | ICD-10-CM | POA: Diagnosis not present

## 2022-06-06 ENCOUNTER — Other Ambulatory Visit (HOSPITAL_COMMUNITY): Payer: Self-pay | Admitting: Family Medicine

## 2022-06-06 DIAGNOSIS — R011 Cardiac murmur, unspecified: Secondary | ICD-10-CM

## 2022-06-07 DIAGNOSIS — L570 Actinic keratosis: Secondary | ICD-10-CM | POA: Diagnosis not present

## 2022-06-07 DIAGNOSIS — L578 Other skin changes due to chronic exposure to nonionizing radiation: Secondary | ICD-10-CM | POA: Diagnosis not present

## 2022-06-07 DIAGNOSIS — D044 Carcinoma in situ of skin of scalp and neck: Secondary | ICD-10-CM | POA: Diagnosis not present

## 2022-06-30 ENCOUNTER — Ambulatory Visit (HOSPITAL_COMMUNITY): Payer: Medicare Other | Attending: Family Medicine

## 2022-06-30 DIAGNOSIS — R011 Cardiac murmur, unspecified: Secondary | ICD-10-CM | POA: Diagnosis not present

## 2022-06-30 LAB — ECHOCARDIOGRAM COMPLETE
Area-P 1/2: 4.15 cm2
MV M vel: 5.1 m/s
MV Peak grad: 103.8 mmHg
S' Lateral: 2.2 cm

## 2022-08-05 DIAGNOSIS — J189 Pneumonia, unspecified organism: Secondary | ICD-10-CM

## 2022-08-05 HISTORY — DX: Pneumonia, unspecified organism: J18.9

## 2022-08-12 ENCOUNTER — Encounter (HOSPITAL_COMMUNITY): Payer: Self-pay

## 2022-08-12 ENCOUNTER — Inpatient Hospital Stay (HOSPITAL_COMMUNITY)
Admission: EM | Admit: 2022-08-12 | Discharge: 2022-08-25 | DRG: 871 | Disposition: A | Discharge status: Skilled Nursing Facility | Payer: Medicare Other | Attending: Internal Medicine | Admitting: Internal Medicine

## 2022-08-12 ENCOUNTER — Other Ambulatory Visit: Payer: Self-pay

## 2022-08-12 ENCOUNTER — Emergency Department (HOSPITAL_COMMUNITY): Payer: Medicare Other

## 2022-08-12 DIAGNOSIS — I341 Nonrheumatic mitral (valve) prolapse: Secondary | ICD-10-CM | POA: Diagnosis not present

## 2022-08-12 DIAGNOSIS — A419 Sepsis, unspecified organism: Principal | ICD-10-CM | POA: Diagnosis present

## 2022-08-12 DIAGNOSIS — F10139 Alcohol abuse with withdrawal, unspecified: Secondary | ICD-10-CM | POA: Diagnosis not present

## 2022-08-12 DIAGNOSIS — E43 Unspecified severe protein-calorie malnutrition: Secondary | ICD-10-CM | POA: Diagnosis not present

## 2022-08-12 DIAGNOSIS — G500 Trigeminal neuralgia: Secondary | ICD-10-CM | POA: Diagnosis not present

## 2022-08-12 DIAGNOSIS — Z85828 Personal history of other malignant neoplasm of skin: Secondary | ICD-10-CM | POA: Diagnosis not present

## 2022-08-12 DIAGNOSIS — R6520 Severe sepsis without septic shock: Secondary | ICD-10-CM | POA: Diagnosis present

## 2022-08-12 DIAGNOSIS — I10 Essential (primary) hypertension: Secondary | ICD-10-CM | POA: Diagnosis not present

## 2022-08-12 DIAGNOSIS — E7800 Pure hypercholesterolemia, unspecified: Secondary | ICD-10-CM | POA: Diagnosis present

## 2022-08-12 DIAGNOSIS — Z79899 Other long term (current) drug therapy: Secondary | ICD-10-CM

## 2022-08-12 DIAGNOSIS — E876 Hypokalemia: Secondary | ICD-10-CM | POA: Diagnosis not present

## 2022-08-12 DIAGNOSIS — R059 Cough, unspecified: Secondary | ICD-10-CM | POA: Diagnosis not present

## 2022-08-12 DIAGNOSIS — E785 Hyperlipidemia, unspecified: Secondary | ICD-10-CM | POA: Insufficient documentation

## 2022-08-12 DIAGNOSIS — Z1152 Encounter for screening for COVID-19: Secondary | ICD-10-CM | POA: Diagnosis not present

## 2022-08-12 DIAGNOSIS — F10939 Alcohol use, unspecified with withdrawal, unspecified: Secondary | ICD-10-CM | POA: Diagnosis not present

## 2022-08-12 DIAGNOSIS — Z681 Body mass index (BMI) 19 or less, adult: Secondary | ICD-10-CM

## 2022-08-12 DIAGNOSIS — R6889 Other general symptoms and signs: Secondary | ICD-10-CM | POA: Diagnosis not present

## 2022-08-12 DIAGNOSIS — G9341 Metabolic encephalopathy: Secondary | ICD-10-CM | POA: Insufficient documentation

## 2022-08-12 DIAGNOSIS — G5132 Clonic hemifacial spasm, left: Secondary | ICD-10-CM | POA: Diagnosis not present

## 2022-08-12 DIAGNOSIS — I480 Paroxysmal atrial fibrillation: Secondary | ICD-10-CM | POA: Insufficient documentation

## 2022-08-12 DIAGNOSIS — E872 Acidosis, unspecified: Secondary | ICD-10-CM | POA: Diagnosis present

## 2022-08-12 DIAGNOSIS — R531 Weakness: Secondary | ICD-10-CM | POA: Diagnosis not present

## 2022-08-12 DIAGNOSIS — J189 Pneumonia, unspecified organism: Secondary | ICD-10-CM | POA: Diagnosis not present

## 2022-08-12 DIAGNOSIS — B9789 Other viral agents as the cause of diseases classified elsewhere: Secondary | ICD-10-CM | POA: Diagnosis present

## 2022-08-12 DIAGNOSIS — R0602 Shortness of breath: Secondary | ICD-10-CM

## 2022-08-12 DIAGNOSIS — Z8249 Family history of ischemic heart disease and other diseases of the circulatory system: Secondary | ICD-10-CM

## 2022-08-12 DIAGNOSIS — J159 Unspecified bacterial pneumonia: Secondary | ICD-10-CM | POA: Diagnosis present

## 2022-08-12 DIAGNOSIS — R6521 Severe sepsis with septic shock: Secondary | ICD-10-CM | POA: Diagnosis not present

## 2022-08-12 DIAGNOSIS — J9601 Acute respiratory failure with hypoxia: Secondary | ICD-10-CM | POA: Diagnosis not present

## 2022-08-12 DIAGNOSIS — Z743 Need for continuous supervision: Secondary | ICD-10-CM | POA: Diagnosis not present

## 2022-08-12 DIAGNOSIS — R7401 Elevation of levels of liver transaminase levels: Secondary | ICD-10-CM | POA: Diagnosis present

## 2022-08-12 DIAGNOSIS — I340 Nonrheumatic mitral (valve) insufficiency: Secondary | ICD-10-CM | POA: Insufficient documentation

## 2022-08-12 DIAGNOSIS — R000 Tachycardia, unspecified: Secondary | ICD-10-CM | POA: Diagnosis not present

## 2022-08-12 DIAGNOSIS — R0689 Other abnormalities of breathing: Secondary | ICD-10-CM | POA: Diagnosis not present

## 2022-08-12 DIAGNOSIS — R509 Fever, unspecified: Secondary | ICD-10-CM

## 2022-08-12 DIAGNOSIS — I4819 Other persistent atrial fibrillation: Secondary | ICD-10-CM | POA: Diagnosis not present

## 2022-08-12 DIAGNOSIS — I499 Cardiac arrhythmia, unspecified: Secondary | ICD-10-CM | POA: Diagnosis not present

## 2022-08-12 DIAGNOSIS — Z781 Physical restraint status: Secondary | ICD-10-CM

## 2022-08-12 DIAGNOSIS — E871 Hypo-osmolality and hyponatremia: Secondary | ICD-10-CM | POA: Diagnosis present

## 2022-08-12 DIAGNOSIS — R1312 Dysphagia, oropharyngeal phase: Secondary | ICD-10-CM | POA: Diagnosis not present

## 2022-08-12 LAB — COMPREHENSIVE METABOLIC PANEL
ALT: 14 U/L (ref 0–44)
AST: 40 U/L (ref 15–41)
Albumin: 2.9 g/dL — ABNORMAL LOW (ref 3.5–5.0)
Alkaline Phosphatase: 54 U/L (ref 38–126)
Anion gap: 15 (ref 5–15)
BUN: 18 mg/dL (ref 8–23)
CO2: 19 mmol/L — ABNORMAL LOW (ref 22–32)
Calcium: 8.4 mg/dL — ABNORMAL LOW (ref 8.9–10.3)
Chloride: 98 mmol/L (ref 98–111)
Creatinine, Ser: 1.12 mg/dL (ref 0.61–1.24)
GFR, Estimated: >60 mL/min (ref >60)
Glucose, Bld: 185 mg/dL — ABNORMAL HIGH (ref 70–99)
Potassium: 3.5 mmol/L (ref 3.5–5.1)
Sodium: 132 mmol/L — ABNORMAL LOW (ref 135–145)
Total Bilirubin: 1.2 mg/dL (ref 0.3–1.2)
Total Protein: 6.2 g/dL — ABNORMAL LOW (ref 6.5–8.1)

## 2022-08-12 LAB — CBC WITH DIFFERENTIAL/PLATELET
Abs Immature Granulocytes: 0 10*3/uL (ref 0.00–0.07)
Basophils Absolute: 0 10*3/uL (ref 0.0–0.1)
Basophils Relative: 0 %
Eosinophils Absolute: 0 10*3/uL (ref 0.0–0.5)
Eosinophils Relative: 0 %
HCT: 40.6 % (ref 39.0–52.0)
Hemoglobin: 13.5 g/dL (ref 13.0–17.0)
Lymphocytes Relative: 3 %
Lymphs Abs: 0.5 10*3/uL — ABNORMAL LOW (ref 0.7–4.0)
MCH: 31.9 pg (ref 26.0–34.0)
MCHC: 33.3 g/dL (ref 30.0–36.0)
MCV: 96 fL (ref 80.0–100.0)
Monocytes Absolute: 0.3 10*3/uL (ref 0.1–1.0)
Monocytes Relative: 2 %
Neutro Abs: 14.3 10*3/uL — ABNORMAL HIGH (ref 1.7–7.7)
Neutrophils Relative %: 95 %
Platelets: 214 10*3/uL (ref 150–400)
RBC: 4.23 MIL/uL (ref 4.22–5.81)
RDW: 13.4 % (ref 11.5–15.5)
WBC: 15 10*3/uL — ABNORMAL HIGH (ref 4.0–10.5)
nRBC: 0 % (ref 0.0–0.2)
nRBC: 0 /100 WBC

## 2022-08-12 LAB — RESP PANEL BY RT-PCR (RSV, FLU A&B, COVID)  RVPGX2
Influenza A by PCR: NEGATIVE
Influenza B by PCR: NEGATIVE
Resp Syncytial Virus by PCR: NEGATIVE
SARS Coronavirus 2 by RT PCR: NEGATIVE

## 2022-08-12 LAB — LACTIC ACID, PLASMA
Lactic Acid, Venous: 4.6 mmol/L (ref 0.5–1.9)
Lactic Acid, Venous: 1.6 mmol/L (ref 0.5–1.9)

## 2022-08-12 LAB — PROCALCITONIN: Procalcitonin: 1.16 ng/mL

## 2022-08-12 MED ORDER — LACTATED RINGERS IV BOLUS
1000.0000 mL | Freq: Once | INTRAVENOUS | Status: AC
  Administered 2022-08-12: 1000 mL via INTRAVENOUS

## 2022-08-12 MED ORDER — GABAPENTIN 300 MG PO CAPS: 300.0000 mg | ORAL_CAPSULE | Freq: Two times a day (BID) | ORAL | Status: DC

## 2022-08-12 MED ORDER — LACTATED RINGERS IV SOLN: INTRAVENOUS | Status: DC

## 2022-08-12 MED ORDER — ACETAMINOPHEN 500 MG PO TABS
1000.0000 mg | ORAL_TABLET | ORAL | Status: AC
  Administered 2022-08-12: 1000 mg via ORAL
  Filled 2022-08-12: qty 2

## 2022-08-12 MED ORDER — PRAVASTATIN SODIUM 10 MG PO TABS
10.0000 mg | ORAL_TABLET | Freq: Every day | ORAL | Status: DC
  Administered 2022-08-12 – 2022-08-13 (×2): 10 mg via ORAL
  Filled 2022-08-12 (×2): qty 1

## 2022-08-12 MED ORDER — SODIUM CHLORIDE 0.9 % IV SOLN
500.0000 mg | INTRAVENOUS | Status: DC
  Administered 2022-08-12: 500 mg via INTRAVENOUS
  Filled 2022-08-12 (×2): qty 5

## 2022-08-12 MED ORDER — SODIUM CHLORIDE 0.9% FLUSH
3.0000 mL | Freq: Two times a day (BID) | INTRAVENOUS | Status: DC
  Administered 2022-08-12 – 2022-08-25 (×25): 3 mL via INTRAVENOUS

## 2022-08-12 MED ORDER — HYDROCOD POLI-CHLORPHE POLI ER 10-8 MG/5ML PO SUER
5.0000 mL | Freq: Two times a day (BID) | ORAL | Status: DC | PRN
  Administered 2022-08-12 – 2022-08-14 (×2): 5 mL via ORAL
  Filled 2022-08-12 (×2): qty 5

## 2022-08-12 MED ORDER — GUAIFENESIN 100 MG/5ML PO LIQD
5.0000 mL | ORAL | Status: DC | PRN
  Administered 2022-08-12 – 2022-08-15 (×4): 5 mL via ORAL
  Filled 2022-08-12: qty 5
  Filled 2022-08-12 (×3): qty 10

## 2022-08-12 MED ORDER — ACETAMINOPHEN 325 MG PO TABS
650.0000 mg | ORAL_TABLET | Freq: Four times a day (QID) | ORAL | Status: DC | PRN
  Filled 2022-08-12: qty 2

## 2022-08-12 MED ORDER — POLYETHYLENE GLYCOL 3350 17 G PO PACK: 17.0000 g | PACK | Freq: Every day | ORAL | Status: DC | PRN

## 2022-08-12 MED ORDER — ACETAMINOPHEN 650 MG RE SUPP: 650.0000 mg | Freq: Four times a day (QID) | RECTAL | Status: DC | PRN

## 2022-08-12 MED ORDER — GABAPENTIN 300 MG PO CAPS
600.0000 mg | ORAL_CAPSULE | Freq: Four times a day (QID) | ORAL | Status: DC
  Administered 2022-08-12 – 2022-08-15 (×13): 600 mg via ORAL
  Filled 2022-08-12 (×13): qty 2

## 2022-08-12 MED ORDER — AMLODIPINE BESYLATE 5 MG PO TABS
5.0000 mg | ORAL_TABLET | Freq: Every day | ORAL | Status: DC
  Administered 2022-08-13: 5 mg via ORAL
  Filled 2022-08-12: qty 1

## 2022-08-12 MED ORDER — SODIUM CHLORIDE 0.9 % IV SOLN
2.0000 g | INTRAVENOUS | Status: DC
  Administered 2022-08-12 – 2022-08-14 (×3): 2 g via INTRAVENOUS
  Filled 2022-08-12 (×3): qty 20

## 2022-08-12 MED ORDER — ENOXAPARIN SODIUM 40 MG/0.4ML IJ SOSY
40.0000 mg | PREFILLED_SYRINGE | INTRAMUSCULAR | Status: DC
  Administered 2022-08-12 – 2022-08-19 (×7): 40 mg via SUBCUTANEOUS
  Filled 2022-08-12 (×5): qty 0.4

## 2022-08-12 MED ORDER — MELATONIN 3 MG PO TABS
3.0000 mg | ORAL_TABLET | Freq: Every evening | ORAL | Status: DC | PRN
  Administered 2022-08-12: 3 mg via ORAL
  Filled 2022-08-12: qty 1

## 2022-08-12 MED ORDER — SODIUM CHLORIDE 0.9 % IV BOLUS
1000.0000 mL | Freq: Once | INTRAVENOUS | Status: AC
  Administered 2022-08-12: 1000 mL via INTRAVENOUS

## 2022-08-12 NOTE — H&P (Signed)
History and Physical   KAIPO BEHNING WUJ:[id number] DOB: Dec 09, 1950 DOA: 08/12/2022  PCP: Farris Has, MD   Patient coming from: Home  Chief Complaint: Fever, cough, weakness  HPI: James Ibarra is a 72 y.o. male with medical history significant of trigeminal neuralgia, facial spasm, hypertension, hyperlipidemia presenting with fever cough and weakness.  Patient has had ongoing symptoms for about 3 days.  He was recently in vacation in [place] where grandson had similar symptoms there.  Noted a low-grade fever at home and reports some mild shortness of breath.  Did take a home COVID test which was negative.  Due to worsening symptoms presented to the ED for further evaluation.  Wife reports some confusion this AM. Denies chills, chest pain, abdominal pain, constipation, diarrhea, nausea, vomiting.  ED Course: Signs in the ED notable for fever to 101.2, heart rate in the 100s, blood pressure in the 130s to 140 systolic, respirate in the 20s to 30s, saturating well on room air.  Lab workup included CMP with sodium 132, chloride 98, bicarb 18, gap borderline at 15, glucose 185, calcium 8.4, protein 6.2, albumin 2.9.  CBC with leukocytosis to 15.  Lactic acid initially elevated to 4.6 with repeat pending.  Respiratory panel for flu COVID and RSV negative.  Urinalysis pending.  Blood cultures pending.  Chest x-ray with opacities of the right upper and right lower lobes consistent with multifocal pneumonia.  Patient received ceftriaxone, azithromycin, 2 L IV fluids, Tylenol in the ED.  Review of Systems: As per HPI otherwise all other systems reviewed and are negative.  Past Medical History:  Diagnosis Date   History of nonmelanoma skin cancer    Hypercholesteremia    Hypertension    Trigeminal neuralgia     Past Surgical History:  Procedure Laterality Date   HERNIA REPAIR  11/07/11   [facility]   INGUINAL HERNIA REPAIR  11/07/2011   Procedure: LAPAROSCOPIC INGUINAL HERNIA;  Surgeon: Atilano Ina, MD,FACS;  Location: [facility];  Service: General;  Laterality: Left;   RETINAL DETACHMENT SURGERY  1997   SKIN CANCER EXCISION  2012, 1999   basal cell - neck and hand    Social History  reports that he has never smoked. He has never used smokeless tobacco. He reports current alcohol use. He reports that he does not use drugs.  No Known Allergies  Family History  Problem Relation Age of Onset   Heart disease Father    Heart disease Mother   Reviewed on admission  Prior to Admission medications   Medication Sig Start Date End Date Taking? Authorizing Provider  amLODipine (NORVASC) 5 MG tablet Take 5 mg by mouth daily before breakfast.     [provider]  botulinum toxin Type A (BOTOX) 100 units SOLR injection Inject 100 Units into the muscle every 3 (three) months. 05/24/21   Levert Feinstein, MD  gabapentin (NEURONTIN) 300 MG capsule Take 2 capsules by mouth four times a day. 12/05/21   Levert Feinstein, MD  ketoconazole (NIZORAL) 2 % cream as needed. 01/16/13   [provider]  melatonin 3 MG TABS tablet Take 3 mg by mouth at bedtime as needed.    [provider]  Multiple Vitamins-Minerals (ICAPS AREDS 2 PO) Take 1 capsule by mouth in the morning and at bedtime.    [provider]  olmesartan-hydrochlorothiazide (BENICAR HCT) 40-25 MG per tablet Take 1 tablet by mouth daily before breakfast.     [provider]  POTASSIUM  GLUCONATE PO Take 2 tablets by mouth daily.    [provider]  pravastatin (PRAVACHOL) 10 MG tablet Take 10 mg by mouth at bedtime.    [provider]  sildenafil (REVATIO) 20 MG tablet Take 2-5 tables for ED orally once a day if needed    [provider]  traZODone (DESYREL) 100 MG tablet Take 50-100 mg by mouth at bedtime as needed.     [provider]    Physical Exam: Vitals:   08/12/22 1445 08/12/22 1530  BP: (!) 144/74 134/75  Pulse: (!) 106 100  Resp: (!) 24 (!) 31  Temp: (!)  101.2 F (38.4 C)   TempSrc: Oral   SpO2: 93% 94%    Physical Exam Constitutional:      General: He is not in acute distress.    Comments: Tired appearing  HENT:     Head: Normocephalic and atraumatic.     Mouth/Throat:     Mouth: Mucous membranes are moist.     Pharynx: Oropharynx is clear.  Eyes:     Extraocular Movements: Extraocular movements intact.     Pupils: Pupils are equal, round, and reactive to light.  Cardiovascular:     Rate and Rhythm: Regular rhythm. Tachycardia present.     Pulses: Normal pulses.     Heart sounds: Normal heart sounds.  Pulmonary:     Effort: Pulmonary effort is normal. No respiratory distress.     Breath sounds: Examination of the right-middle field reveals rhonchi. Examination of the right-lower field reveals rhonchi. Rhonchi present.  Abdominal:     General: Bowel sounds are normal. There is no distension.     Palpations: Abdomen is soft.     Tenderness: There is no abdominal tenderness.  Musculoskeletal:        General: No swelling or deformity.  Skin:    General: Skin is warm and dry.  Neurological:     General: No focal deficit present.     Mental Status: Mental status is at baseline.    Labs on Admission: I have personally reviewed following labs and imaging studies  CBC: Recent Labs  Lab 08/12/22 1445  WBC 15.0*  NEUTROABS 14.3*  HGB 13.5  HCT 40.6  MCV 96.0  PLT 214    Basic Metabolic Panel: Recent Labs  Lab 08/12/22 1445  NA 132*  K 3.5  CL 98  CO2 19*  GLUCOSE 185*  BUN 18  CREATININE 1.12  CALCIUM 8.4*    GFR: CrCl cannot be calculated (Unknown ideal weight.).  Liver Function Tests: Recent Labs  Lab 08/12/22 1445  AST 40  ALT 14  ALKPHOS 54  BILITOT 1.2  PROT 6.2*  ALBUMIN 2.9*    Urine analysis: No results found for: "COLORURINE", "APPEARANCEUR", "LABSPEC", "PHURINE", "GLUCOSEU", "HGBUR", "BILIRUBINUR", "KETONESUR", "PROTEINUR", "UROBILINOGEN", "NITRITE", "LEUKOCYTESUR"  Radiological  Exams on Admission: DG Chest 2 View  Result Date: 08/12/2022 CLINICAL DATA:  Cough, concern for pneumonia. EXAM: CHEST - 2 VIEW COMPARISON:  Chest radiograph 05/23/2018 FINDINGS: The heart is at the upper limits of normal for size, likely exaggerated by AP technique. The upper mediastinal contours are normal allowing for rightward patient rotation. There is confluent opacity in the right upper lobe along the fissure. As well as patchy opacities in the right lower lobe. The left lung is clear. There is no significant pleural effusion. There is no pneumothorax. There is no acute osseous abnormality. IMPRESSION: Opacities in the right upper and lower lobes consistent with multifocal pneumonia.  Recommend follow-up radiographs in 6-8 weeks to ensure resolution. Electronically Signed   By: Lesia Hausen M.D.   On: 08/12/2022 15:22    EKG: Independently reviewed. Sinus tachycardia versus sinus arrhythmia at 100 bpm.  Nonspecific T wave flattening.  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  Wife reports some confusion at home this AM. > Found to have fever to 101.2, tachycardia in the 100s, respiratory rate in the 20s to 30s, lactic acid elevated to 4.6 with repeat pending.  Leukocytosis to 15.  Chest x-ray showing opacities of the right upper and right lower lobe consistent with multifocal pneumonia. > Started on ceftriaxone and azithromycin in the ED also received Tylenol and 2 L of IV fluids.  Blood pressure remained stable.  Not currently requiring oxygen. - Monitor in progressive unit overnight - Continue with ceftriaxone and azithromycin - Trend fever curve and WBC - Trend lactic acid - Check procalcitonin - Follow-up blood cultures  Hypertension - Continue  home amlodipine - Holding home olmesartan-hydrochlorothiazide for now in the setting of developing sepsis  Hyperlipidemia - Continue home pravastatin  Trigeminal neuralgia - Continue home gabapentin  DVT prophylaxis: Lovenox Code Status:   Full Family Communication:  Updated at bedside  Disposition Plan:   Patient is from:  Home  Anticipated DC to:  Home  Anticipated DC date:  1 to 3 days  Anticipated DC barriers: None  Consults called:  None Admission status:  Observation, progressive  Severity of Illness: The appropriate patient status for this patient is OBSERVATION. Observation status is judged to be reasonable and necessary in order to provide the required intensity of service to ensure the patient's safety. The patient's presenting symptoms, physical exam findings, and initial radiographic and laboratory data in the context of their medical condition is felt to place them at decreased risk for further clinical deterioration. Furthermore, it is anticipated that the patient will be medically stable for discharge from the hospital within 2 midnights of admission.    Synetta Fail MD [facility]  How to contact the [facility] Attending or Consulting provider 7A - 7P or covering provider during after hours 7P -7A, for this patient?   Check the care team in [facility] and look for a) attending/consulting [facility] provider listed and b) the [facility] team listed Log into [email or web address] and use [place]'s universal password to access. If you do not have the password, please contact the hospital operator. Locate the [facility] provider you are looking for under [facility] and page to a number that you can be directly reached. If you still have difficulty reaching the provider, please page the [facility] (Director on Call) for the Hospitalists listed on amion for assistance.  08/12/2022, 4:02 PM

## 2022-08-12 NOTE — ED Notes (Signed)
ED TO INPATIENT HANDOFF REPORT  ED Nurse Name and Phone #: Ladona Ridgel [phone]  S Name/Age/Gender James Ibarra 72 y.o. male Room/Bed: TRAAC/TRAAC  Code Status   Code Status: Full Code  Home/SNF/Other Home Patient oriented to: self, place, time, and situation Is this baseline? Yes   Triage Complete: Triage complete  Chief Complaint Sepsis [facility]) [A41.9]  Triage Note Pt BIB [facility] from home for weakness, wife couldn't get him off toilet, and SOB.  URI symptoms began Sat.  Wife and grandson have been sick with similar symptoms.  WIfe and pt endorse low grade fever, unproductive cough.   130/72 HR 117, 93% RA, ETC02 23.  Pt ON 2L on arrival.  RA at baseline.   Allergies No Known Allergies  Level of Care/Admitting Diagnosis ED Disposition     ED Disposition  Admit   Condition  --   Comment  Hospital Area: [facility] [[id number]]  Level of Care: Progressive [102]  Admit to Progressive based on following criteria: MULTISYSTEM THREATS such as stable sepsis, metabolic/electrolyte imbalance with or without encephalopathy that is responding to early treatment.  May place patient in observation at [facility] or [facility] if equivalent level of care is available:: No  Covid Evaluation: Confirmed COVID Negative  Diagnosis: Sepsis [facility]) [[id number]]  Admitting Physician: Synetta Fail [[id number]]  Attending Physician: Synetta Fail [phone]          B Medical/Surgery History Past Medical History:  Diagnosis Date   History of nonmelanoma skin cancer    Hypercholesteremia    Hypertension    Trigeminal neuralgia    Past Surgical History:  Procedure Laterality Date   HERNIA REPAIR  11/07/11   [facility]   INGUINAL HERNIA REPAIR  11/07/2011   Procedure: LAPAROSCOPIC INGUINAL HERNIA;  Surgeon: Atilano Ina, MD,FACS;  Location: [facility] ORS;  Service: General;  Laterality: Left;   RETINAL DETACHMENT SURGERY  1997   SKIN CANCER EXCISION  2012, 1999   basal cell -  neck and hand     A IV Location/Drains/Wounds Patient Lines/Drains/Airways Status     Active Line/Drains/Airways     Name Placement date Placement time Site Days   Peripheral IV 08/12/22 20 G Left;Posterior Forearm 08/12/22  1447  Forearm  less than 1   Peripheral IV 08/12/22 20 G Left Antecubital 08/12/22  1510  Antecubital  less than 1   Incision 11/07/11 Groin 11/07/11  0902  -- 3931   Incision 11/07/11 Abdomen 11/07/11  0902  -- 3931            Intake/Output Last 24 hours  Intake/Output Summary (Last 24 hours) at 08/12/2022 1642 Last data filed at 08/12/2022 1624 Gross per 24 hour  Intake 1100 ml  Output --  Net 1100 ml    Labs/Imaging Results for orders placed or performed during the hospital encounter of 08/12/22 (from the past 48 hour(s))  Resp panel by RT-PCR (RSV, Flu A&B, Covid) Anterior Nasal Swab     Status: None   Collection Time: 08/12/22  2:30 PM   Specimen: Anterior Nasal Swab  Result Value Ref Range   SARS Coronavirus 2 by RT PCR NEGATIVE NEGATIVE   Influenza A by PCR NEGATIVE NEGATIVE   Influenza B by PCR NEGATIVE NEGATIVE    Comment: (NOTE) The Xpert Xpress SARS-CoV-2/FLU/RSV plus assay is intended as an aid in the diagnosis of influenza from Nasopharyngeal swab specimens and should not be used as a sole basis for treatment. Nasal washings and  aspirates are unacceptable for Xpert Xpress SARS-CoV-2/FLU/RSV testing.  Fact Sheet for Patients: BloggerCourse.com  Fact Sheet for Healthcare Providers: SeriousBroker.it  This test is not yet approved or cleared by the [place] FDA and has been authorized for detection and/or diagnosis of SARS-CoV-2 by FDA under an Emergency Use Authorization (EUA). This EUA will remain in effect (meaning this test can be used) for the duration of the COVID-19 declaration under Section 564(b)(1) of the Act, 21 U.S.C. section 360bbb-3(b)(1), unless the authorization is  terminated or revoked.     Resp Syncytial Virus by PCR NEGATIVE NEGATIVE    Comment: (NOTE) Fact Sheet for Patients: BloggerCourse.com  Fact Sheet for Healthcare Providers: SeriousBroker.it  This test is not yet approved or cleared by the [place] FDA and has been authorized for detection and/or diagnosis of SARS-CoV-2 by FDA under an Emergency Use Authorization (EUA). This EUA will remain in effect (meaning this test can be used) for the duration of the COVID-19 declaration under Section 564(b)(1) of the Act, 21 U.S.C. section 360bbb-3(b)(1), unless the authorization is terminated or revoked.  Performed at [facility], [address] [place], [place] [address]   Lactic acid, plasma     Status: Abnormal   Collection Time: 08/12/22  2:45 PM  Result Value Ref Range   Lactic Acid, Venous 4.6 (HH) 0.5 - 1.9 mmol/L    Comment: CRITICAL RESULT CALLED TO, READ BACK BY AND VERIFIED WITH T.SHROPSHIRE RN @ [phone] 08/12/22 BY C.EDENS Performed at [facility], [address], [place], [place] [address]   Comprehensive metabolic panel     Status: Abnormal   Collection Time: 08/12/22  2:45 PM  Result Value Ref Range   Sodium 132 (L) 135 - 145 mmol/L   Potassium 3.5 3.5 - 5.1 mmol/L   Chloride 98 98 - 111 mmol/L   CO2 19 (L) 22 - 32 mmol/L   Glucose, Bld 185 (H) 70 - 99 mg/dL    Comment: Glucose reference range applies only to samples taken after fasting for at least 8 hours.   BUN 18 8 - 23 mg/dL   Creatinine, Ser 5.62 0.61 - 1.24 mg/dL   Calcium 8.4 (L) 8.9 - 10.3 mg/dL   Total Protein 6.2 (L) 6.5 - 8.1 g/dL   Albumin 2.9 (L) 3.5 - 5.0 g/dL   AST 40 15 - 41 U/L   ALT 14 0 - 44 U/L   Alkaline Phosphatase 54 38 - 126 U/L   Total Bilirubin 1.2 0.3 - 1.2 mg/dL   GFR, Estimated >13 >08 mL/min    Comment: (NOTE) Calculated using the CKD-EPI Creatinine Equation (2021)    Anion gap 15 5 - 15    Comment: Performed at  [facility], [address], [place], [place] [address]  CBC with Differential     Status: Abnormal   Collection Time: 08/12/22  2:45 PM  Result Value Ref Range   WBC 15.0 (H) 4.0 - 10.5 K/uL   RBC 4.23 4.22 - 5.81 MIL/uL   Hemoglobin 13.5 13.0 - 17.0 g/dL   HCT 69.6 29.5 - 28.4 %   MCV 96.0 80.0 - 100.0 fL   MCH 31.9 26.0 - 34.0 pg   MCHC 33.3 30.0 - 36.0 g/dL   RDW 13.2 44.0 - 10.2 %   Platelets 214 150 - 400 K/uL   nRBC 0.0 0.0 - 0.2 %   Neutrophils Relative % 95 %   Neutro Abs 14.3 (H) 1.7 -  7.7 K/uL   Lymphocytes Relative 3 %   Lymphs Abs 0.5 (L) 0.7 - 4.0 K/uL   Monocytes Relative 2 %   Monocytes Absolute 0.3 0.1 - 1.0 K/uL   Eosinophils Relative 0 %   Eosinophils Absolute 0.0 0.0 - 0.5 K/uL   Basophils Relative 0 %   Basophils Absolute 0.0 0.0 - 0.1 K/uL   nRBC 0 0 /100 WBC   Abs Immature Granulocytes 0.00 0.00 - 0.07 K/uL    Comment: Performed at [facility], [address], [place], [place] [address]   DG Chest 2 View  Result Date: 08/12/2022 CLINICAL DATA:  Cough, concern for pneumonia. EXAM: CHEST - 2 VIEW COMPARISON:  Chest radiograph 05/23/2018 FINDINGS: The heart is at the upper limits of normal for size, likely exaggerated by AP technique. The upper mediastinal contours are normal allowing for rightward patient rotation. There is confluent opacity in the right upper lobe along the fissure. As well as patchy opacities in the right lower lobe. The left lung is clear. There is no significant pleural effusion. There is no pneumothorax. There is no acute osseous abnormality. IMPRESSION: Opacities in the right upper and lower lobes consistent with multifocal pneumonia. Recommend follow-up radiographs in 6-8 weeks to ensure resolution. Electronically Signed   By: Lesia Hausen M.D.   On: 08/12/2022 15:22    Pending Labs Unresulted Labs (From admission, onward)     Start     Ordered   08/19/22 0500  Creatinine, serum  (enoxaparin (LOVENOX)    CrCl >/= 30 ml/min)   Weekly,   R     Comments: while on enoxaparin therapy    08/12/22 1602   08/13/22 0500  Protime-INR  Tomorrow morning,   R        08/12/22 1602   08/13/22 0500  Cortisol-am, blood  Tomorrow morning,   R        08/12/22 1602   08/13/22 0500  Comprehensive metabolic panel  Tomorrow morning,   R        08/12/22 1602   08/13/22 0500  CBC  Tomorrow morning,   R        08/12/22 1602   08/12/22 1603  Procalcitonin  Daily,   R     References:    Procalcitonin Lower Respiratory Tract Infection AND Sepsis Procalcitonin Algorithm   08/12/22 1602   08/12/22 1430  Lactic acid, plasma  (Septic presentation on arrival (screening labs, nursing and treatment orders for obvious sepsis))  Now then every 2 hours,   R (with STAT occurrences)      08/12/22 1430   08/12/22 1430  Blood Culture (routine x 2)  (Septic presentation on arrival (screening labs, nursing and treatment orders for obvious sepsis))  BLOOD CULTURE X 2,   STAT      08/12/22 1430   08/12/22 1430  Urinalysis, Routine w reflex microscopic -Urine, Clean Catch  (Septic presentation on arrival (screening labs, nursing and treatment orders for obvious sepsis))  ONCE - URGENT,   URGENT       Question:  Specimen Source  Answer:  Urine, Clean Catch   08/12/22 1430            Vitals/Pain Today's Vitals   08/12/22 1445 08/12/22 1530 08/12/22 1540 08/12/22 1624  BP: (!) 144/74 134/75    Pulse: (!) 106 100    Resp: (!) 24 (!) 31    Temp: (!) 101.2 F (38.4 C)   (!) 102.1 F (38.9  C)  TempSrc: Oral   Oral  SpO2: 93% 94%    PainSc:   4      Isolation Precautions No active isolations  Medications Medications  cefTRIAXone (ROCEPHIN) 2 g in sodium chloride 0.9 % 100 mL IVPB (0 g Intravenous Stopped 08/12/22 1622)  azithromycin (ZITHROMAX) 500 mg in sodium chloride 0.9 % 250 mL IVPB (500 mg Intravenous New Bag/Given 08/12/22 1623)  pravastatin (PRAVACHOL) tablet 10 mg (has no administration in time range)  melatonin tablet 3 mg (has no  administration in time range)  amLODipine (NORVASC) tablet 5 mg (has no administration in time range)  gabapentin (NEURONTIN) capsule 300 mg (has no administration in time range)  enoxaparin (LOVENOX) injection 40 mg (has no administration in time range)  sodium chloride flush (NS) 0.9 % injection 3 mL (has no administration in time range)  lactated ringers infusion ( Intravenous New Bag/Given 08/12/22 1626)  acetaminophen (TYLENOL) tablet 650 mg (has no administration in time range)    Or  acetaminophen (TYLENOL) suppository 650 mg (has no administration in time range)  polyethylene glycol (MIRALAX / GLYCOLAX) packet 17 g (has no administration in time range)  acetaminophen (TYLENOL) tablet 1,000 mg (1,000 mg Oral Given 08/12/22 1535)  sodium chloride 0.9 % bolus 1,000 mL (0 mLs Intravenous Stopped 08/12/22 1624)  lactated ringers bolus 1,000 mL (1,000 mLs Intravenous New Bag/Given 08/12/22 1627)    Mobility walks     Focused Assessments Cardiac Assessment Handoff:    No results found for: "CKTOTAL", "CKMB", "CKMBINDEX", "TROPONINI" No results found for: "DDIMER" Does the Patient currently have chest pain? No   , Pulmonary Assessment Handoff:  Lung sounds: Bilateral Breath Sounds: Diminished, Rhonchi O2 Device: Room Air O2 Flow Rate (L/min): 93 L/min    R Recommendations: See Admitting Provider Note  Report given to:   Additional Notes: wife at bedside, pt main complaint at this time is his cough, awaiting orders for cough meds, hospitalist aware

## 2022-08-12 NOTE — ED Notes (Signed)
Lactic acid 4.6; EDP made aware

## 2022-08-12 NOTE — ED Triage Notes (Signed)
Pt BIB [facility] from home for weakness, wife couldn't get him off toilet, and SOB.  URI symptoms began Sat.  Wife and grandson have been sick with similar symptoms.  WIfe and pt endorse low grade fever, unproductive cough.   130/72 HR 117, 93% RA, ETC02 23.  Pt ON 2L on arrival.  RA at baseline.

## 2022-08-12 NOTE — ED Provider Notes (Addendum)
[place] EMERGENCY DEPARTMENT AT [facility] Provider Note   CSN: [id number] Arrival date & time: 08/12/22  1420     History  Chief Complaint  Patient presents with   Fever    James Ibarra is a 72 y.o. male.  72 year old male with history of hypertension and hyperlipidemia presents to the emergency department with fever, cough, and generalized weakness since Wednesday.  Says he was on vacation with his family in [place] when his grandson and wife developed similar symptoms.  Has had a low-grade fever at home but does not recall exactly what this temperature has been.  Also has some mild shortness of breath.  No one was called because he sat down to use the restroom was having difficulty getting back up due to his weakness.  Took a home COVID test that was negative.       Home Medications Prior to Admission medications   Medication Sig Start Date End Date Taking? Authorizing Provider  amLODipine (NORVASC) 5 MG tablet Take 5 mg by mouth daily before breakfast.     [provider]  botulinum toxin Type A (BOTOX) 100 units SOLR injection Inject 100 Units into the muscle every 3 (three) months. 05/24/21   Levert Feinstein, MD  gabapentin (NEURONTIN) 300 MG capsule Take 2 capsules by mouth four times a day. 12/05/21   Levert Feinstein, MD  ketoconazole (NIZORAL) 2 % cream as needed. 01/16/13   [provider]  melatonin 3 MG TABS tablet Take 3 mg by mouth at bedtime as needed.    [provider]  Multiple Vitamins-Minerals (ICAPS AREDS 2 PO) Take 1 capsule by mouth in the morning and at bedtime.    [provider]  olmesartan-hydrochlorothiazide (BENICAR HCT) 40-25 MG per tablet Take 1 tablet by mouth daily before breakfast.     [provider]  POTASSIUM GLUCONATE PO Take 2 tablets by mouth daily.    [provider]  pravastatin (PRAVACHOL) 10 MG tablet Take 10 mg by mouth at bedtime.    [provider]  sildenafil  (REVATIO) 20 MG tablet Take 2-5 tables for ED orally once a day if needed    [provider]  traZODone (DESYREL) 100 MG tablet Take 50-100 mg by mouth at bedtime as needed.     [provider]      Allergies    Patient has no known allergies.    Review of Systems   Review of Systems  Physical Exam Updated Vital Signs BP 134/75   Pulse 100   Temp (!) 101.2 F (38.4 C) (Oral)   Resp (!) 31   SpO2 94%  Physical Exam Vitals and nursing note reviewed.  Constitutional:      General: He is not in acute distress.    Appearance: He is well-developed.     Comments: Coughing frequently during exam  HENT:     Head: Normocephalic and atraumatic.     Right Ear: External ear normal.     Left Ear: External ear normal.     Nose: Nose normal.  Eyes:     Extraocular Movements: Extraocular movements intact.     Conjunctiva/sclera: Conjunctivae normal.     Pupils: Pupils are equal, round, and reactive to light.  Cardiovascular:     Rate and Rhythm: Regular rhythm. Tachycardia present.     Heart sounds: Normal heart sounds.  Pulmonary:     Effort: Pulmonary effort is normal. No respiratory distress.  Breath sounds: Normal breath sounds.  Musculoskeletal:     Cervical back: Normal range of motion and neck supple.     Right lower leg: No edema.     Left lower leg: No edema.  Skin:    General: Skin is warm and dry.  Neurological:     General: No focal deficit present.     Mental Status: He is alert and oriented to person, place, and time. Mental status is at baseline.     Comments: 5/5 strength in bilateral upper extremities.  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  Right lower extremity 5/5 strength.  Psychiatric:        Mood and Affect: Mood normal.        Behavior: Behavior normal.     ED Results / Procedures / Treatments   Labs (all labs ordered are listed, but only abnormal results are displayed) Labs  Reviewed  LACTIC ACID, PLASMA - Abnormal; Notable for the following components:      Result Value   Lactic Acid, Venous 4.6 (*)    All other components within normal limits  COMPREHENSIVE METABOLIC PANEL - Abnormal; Notable for the following components:   Sodium 132 (*)    CO2 19 (*)    Glucose, Bld 185 (*)    Calcium 8.4 (*)    Total Protein 6.2 (*)    Albumin 2.9 (*)    All other components within normal limits  CBC WITH DIFFERENTIAL/PLATELET - Abnormal; Notable for the following components:   WBC 15.0 (*)    Neutro Abs 14.3 (*)    Lymphs Abs 0.5 (*)    All other components within normal limits  RESP PANEL BY RT-PCR (RSV, FLU A&B, COVID)  [id number]  CULTURE, BLOOD (ROUTINE X 2)  CULTURE, BLOOD (ROUTINE X 2)  LACTIC ACID, PLASMA  URINALYSIS, ROUTINE W REFLEX MICROSCOPIC    EKG EKG Interpretation  Date/Time:  Saturday August 12 2022 15:32:38 EDT Ventricular Rate:  100 PR Interval:  170 QRS Duration: 93 QT Interval:  333 QTC Calculation: 430 R Axis:   50 Text Interpretation: Sinus tachycardia Abnormal R-wave progression, early transition Confirmed by Vonita Moss [phone]) on 08/12/2022 3:36:02 PM  Radiology DG Chest 2 View  Result Date: 08/12/2022 CLINICAL DATA:  Cough, concern for pneumonia. EXAM: CHEST - 2 VIEW COMPARISON:  Chest radiograph 05/23/2018 FINDINGS: The heart is at the upper limits of normal for size, likely exaggerated by AP technique. The upper mediastinal contours are normal allowing for rightward patient rotation. There is confluent opacity in the right upper lobe along the fissure. As well as patchy opacities in the right lower lobe. The left lung is clear. There is no significant pleural effusion. There is no pneumothorax. There is no acute osseous abnormality. IMPRESSION: Opacities in the right upper and lower lobes consistent with multifocal pneumonia. Recommend follow-up radiographs in 6-8 weeks to ensure resolution. Electronically Signed   By: Lesia Hausen  M.D.   On: 08/12/2022 15:22    Procedures Procedures    Medications Ordered in ED Medications  cefTRIAXone (ROCEPHIN) 2 g in sodium chloride 0.9 % 100 mL IVPB (2 g Intravenous New Bag/Given 08/12/22 1531)  azithromycin (ZITHROMAX) 500 mg in sodium chloride 0.9 % 250 mL IVPB (has no administration in time range)  lactated ringers bolus 1,000 mL (has no administration in time range)  acetaminophen (TYLENOL) tablet 1,000 mg (1,000 mg Oral Given 08/12/22 1535)  sodium chloride  0.9 % bolus 1,000 mL (1,000 mLs Intravenous New Bag/Given 08/12/22 1528)    ED Course/ Medical Decision Making/ A&P Clinical Course as of 08/12/22 1601  Sat Aug 12, 2022  1600 Dr Alinda Money from hospitalist to admit. [RP]    Clinical Course User Index [RP] Rondel Baton, MD                             Medical Decision Making Amount and/or Complexity of Data Reviewed Labs: ordered. Radiology: ordered. ECG/medicine tests: ordered.  Risk OTC drugs. Decision regarding hospitalization.   James Ibarra is a 72 y.o. male with comorbidities that complicate the patient evaluation including hypertension and hyperlipidemia presents to the emergency department with fever, cough, and generalized weakness since Wednesday.   Initial Ddx:  Pneumonia, URI, COPD  MDM:  Concern the patient may have pneumonia based on his fever and tachycardia as well as ahead and obtain blood cultures and start him on antibiotics since he is likely septic.  With his sick contacts could potentially have a URI as well so we will send a COVID and flu.  No wheezing or rhonchi concerning for COPD exacerbation.  Plan:  Labs Blood cultures Lactate Chest x-ray COVID/flu EKG IV fluids Ceftriaxone and azithromycin  ED Summary/Re-evaluation:  Patient's labs returned and showed elevated lactate of 4.7.  Was ordered 30 mill per kilogram dose of IV fluids.  Chest x-ray did show multifocal pneumonia.  His COVID and flu are negative.  Will admit  the patient to the hospital for further management of his septic shock.  This patient presents to the ED for concern of complaints listed in HPI, this involves an extensive number of treatment options, and is a complaint that carries with it a high risk of complications and morbidity. Disposition including potential need for admission considered.   Dispo: Admit to Floor  Additional history obtained from spouse Records reviewed Outpatient Clinic Notes The following labs were independently interpreted: CBC and show  leukocytosis concerning for sepsis I independently reviewed the following imaging with scope of interpretation limited to determining acute life threatening conditions related to emergency care: Chest x-ray and agree with the radiologist interpretation with the following exceptions: none I personally reviewed and interpreted cardiac monitoring: sinus tachycardia I personally reviewed and interpreted the pt's EKG: see above for interpretation  I have reviewed the patients home medications and made adjustments as needed Consults: Hospitalist Social Determinants of health:  Elderly         Final Clinical Impression(s) / ED Diagnoses Final diagnoses:  Multifocal pneumonia  Septic shock (HCC)  Fever, unspecified fever cause  Shortness of breath    Rx / DC Orders ED Discharge Orders     None      CRITICAL CARE Performed by: Rondel Baton   Total critical care time: 30 minutes  Critical care time was exclusive of separately billable procedures and treating other patients.  Critical care was necessary to treat or prevent imminent or life-threatening deterioration.  Critical care was time spent personally by me on the following activities: development of treatment plan with patient and/or surrogate as well as nursing, discussions with consultants, evaluation of patient's response to treatment, examination of patient, obtaining history from patient or surrogate,  ordering and performing treatments and interventions, ordering and review of laboratory studies, ordering and review of radiographic studies, pulse oximetry and re-evaluation of patient's condition.    Rondel Baton, MD 08/12/22  1557    Rondel Baton, MD 08/12/22 [phone]

## 2022-08-12 NOTE — ED Notes (Signed)
Patient transported to X-ray 

## 2022-08-12 NOTE — ED Provider Notes (Deleted)
72 year old male with history of hypertension who presents emergency department with fever, cough, and generalized weakness after returning from vacation in [place].  Says that several family members have also had similar symptoms has had a low-grade fever at home.  Also reporting mild shortness of breath.  Does not wear any home oxygen.  Will initiate sepsis labs and give him antibiotics for possible pneumonia while his COVID and flu are pending but feel that symptoms are likely from viral URI.   Rondel Baton, MD 08/12/22 [phone]

## 2022-08-12 NOTE — Sepsis Progress Note (Signed)
Elink following code sepsis °

## 2022-08-13 DIAGNOSIS — Z681 Body mass index (BMI) 19 or less, adult: Secondary | ICD-10-CM | POA: Diagnosis not present

## 2022-08-13 DIAGNOSIS — I480 Paroxysmal atrial fibrillation: Secondary | ICD-10-CM | POA: Diagnosis not present

## 2022-08-13 DIAGNOSIS — R6521 Severe sepsis with septic shock: Secondary | ICD-10-CM | POA: Diagnosis not present

## 2022-08-13 DIAGNOSIS — E871 Hypo-osmolality and hyponatremia: Secondary | ICD-10-CM | POA: Diagnosis not present

## 2022-08-13 DIAGNOSIS — M6281 Muscle weakness (generalized): Secondary | ICD-10-CM | POA: Diagnosis not present

## 2022-08-13 DIAGNOSIS — R6520 Severe sepsis without septic shock: Secondary | ICD-10-CM | POA: Diagnosis not present

## 2022-08-13 DIAGNOSIS — J189 Pneumonia, unspecified organism: Secondary | ICD-10-CM | POA: Diagnosis not present

## 2022-08-13 DIAGNOSIS — F10939 Alcohol use, unspecified with withdrawal, unspecified: Secondary | ICD-10-CM | POA: Diagnosis not present

## 2022-08-13 DIAGNOSIS — Z4659 Encounter for fitting and adjustment of other gastrointestinal appliance and device: Secondary | ICD-10-CM | POA: Diagnosis not present

## 2022-08-13 DIAGNOSIS — R531 Weakness: Secondary | ICD-10-CM | POA: Diagnosis not present

## 2022-08-13 DIAGNOSIS — Z85828 Personal history of other malignant neoplasm of skin: Secondary | ICD-10-CM | POA: Diagnosis not present

## 2022-08-13 DIAGNOSIS — R0902 Hypoxemia: Secondary | ICD-10-CM | POA: Diagnosis not present

## 2022-08-13 DIAGNOSIS — E876 Hypokalemia: Secondary | ICD-10-CM | POA: Diagnosis not present

## 2022-08-13 DIAGNOSIS — B9789 Other viral agents as the cause of diseases classified elsewhere: Secondary | ICD-10-CM | POA: Diagnosis present

## 2022-08-13 DIAGNOSIS — R1312 Dysphagia, oropharyngeal phase: Secondary | ICD-10-CM | POA: Diagnosis not present

## 2022-08-13 DIAGNOSIS — R509 Fever, unspecified: Secondary | ICD-10-CM | POA: Diagnosis not present

## 2022-08-13 DIAGNOSIS — Z1152 Encounter for screening for COVID-19: Secondary | ICD-10-CM | POA: Diagnosis not present

## 2022-08-13 DIAGNOSIS — E43 Unspecified severe protein-calorie malnutrition: Secondary | ICD-10-CM | POA: Diagnosis not present

## 2022-08-13 DIAGNOSIS — E872 Acidosis, unspecified: Secondary | ICD-10-CM | POA: Diagnosis not present

## 2022-08-13 DIAGNOSIS — G9341 Metabolic encephalopathy: Secondary | ICD-10-CM | POA: Diagnosis not present

## 2022-08-13 DIAGNOSIS — E7800 Pure hypercholesterolemia, unspecified: Secondary | ICD-10-CM | POA: Diagnosis present

## 2022-08-13 DIAGNOSIS — J159 Unspecified bacterial pneumonia: Secondary | ICD-10-CM | POA: Diagnosis not present

## 2022-08-13 DIAGNOSIS — I341 Nonrheumatic mitral (valve) prolapse: Secondary | ICD-10-CM | POA: Diagnosis not present

## 2022-08-13 DIAGNOSIS — E785 Hyperlipidemia, unspecified: Secondary | ICD-10-CM | POA: Diagnosis not present

## 2022-08-13 DIAGNOSIS — Z8249 Family history of ischemic heart disease and other diseases of the circulatory system: Secondary | ICD-10-CM | POA: Diagnosis not present

## 2022-08-13 DIAGNOSIS — J9601 Acute respiratory failure with hypoxia: Secondary | ICD-10-CM | POA: Diagnosis not present

## 2022-08-13 DIAGNOSIS — M792 Neuralgia and neuritis, unspecified: Secondary | ICD-10-CM | POA: Diagnosis not present

## 2022-08-13 DIAGNOSIS — Z743 Need for continuous supervision: Secondary | ICD-10-CM | POA: Diagnosis not present

## 2022-08-13 DIAGNOSIS — Z79899 Other long term (current) drug therapy: Secondary | ICD-10-CM | POA: Diagnosis not present

## 2022-08-13 DIAGNOSIS — I340 Nonrheumatic mitral (valve) insufficiency: Secondary | ICD-10-CM | POA: Diagnosis not present

## 2022-08-13 DIAGNOSIS — R2689 Other abnormalities of gait and mobility: Secondary | ICD-10-CM | POA: Diagnosis not present

## 2022-08-13 DIAGNOSIS — I6339 Cerebral infarction due to thrombosis of other cerebral artery: Secondary | ICD-10-CM | POA: Diagnosis not present

## 2022-08-13 DIAGNOSIS — I10 Essential (primary) hypertension: Secondary | ICD-10-CM | POA: Diagnosis not present

## 2022-08-13 DIAGNOSIS — D529 Folate deficiency anemia, unspecified: Secondary | ICD-10-CM | POA: Diagnosis not present

## 2022-08-13 DIAGNOSIS — G500 Trigeminal neuralgia: Secondary | ICD-10-CM | POA: Diagnosis not present

## 2022-08-13 DIAGNOSIS — E519 Thiamine deficiency, unspecified: Secondary | ICD-10-CM | POA: Diagnosis not present

## 2022-08-13 DIAGNOSIS — Z7401 Bed confinement status: Secondary | ICD-10-CM | POA: Diagnosis not present

## 2022-08-13 DIAGNOSIS — I4819 Other persistent atrial fibrillation: Secondary | ICD-10-CM | POA: Diagnosis present

## 2022-08-13 DIAGNOSIS — F10139 Alcohol abuse with withdrawal, unspecified: Secondary | ICD-10-CM | POA: Diagnosis not present

## 2022-08-13 DIAGNOSIS — J90 Pleural effusion, not elsewhere classified: Secondary | ICD-10-CM | POA: Diagnosis not present

## 2022-08-13 DIAGNOSIS — Z781 Physical restraint status: Secondary | ICD-10-CM | POA: Diagnosis not present

## 2022-08-13 DIAGNOSIS — R0602 Shortness of breath: Secondary | ICD-10-CM | POA: Diagnosis present

## 2022-08-13 DIAGNOSIS — G5132 Clonic hemifacial spasm, left: Secondary | ICD-10-CM | POA: Diagnosis not present

## 2022-08-13 DIAGNOSIS — A419 Sepsis, unspecified organism: Secondary | ICD-10-CM | POA: Diagnosis not present

## 2022-08-13 LAB — CORTISOL-AM, BLOOD: Cortisol - AM: 17.1 ug/dL (ref 6.7–22.6)

## 2022-08-13 LAB — CBC
HCT: 33 % — ABNORMAL LOW (ref 39.0–52.0)
Hemoglobin: 11.1 g/dL — ABNORMAL LOW (ref 13.0–17.0)
MCH: 31.2 pg (ref 26.0–34.0)
MCHC: 33.6 g/dL (ref 30.0–36.0)
MCV: 92.7 fL (ref 80.0–100.0)
Platelets: 190 10*3/uL (ref 150–400)
RBC: 3.56 MIL/uL — ABNORMAL LOW (ref 4.22–5.81)
RDW: 13.2 % (ref 11.5–15.5)
WBC: 11.6 10*3/uL — ABNORMAL HIGH (ref 4.0–10.5)
nRBC: 0 % (ref 0.0–0.2)

## 2022-08-13 LAB — URINALYSIS, ROUTINE W REFLEX MICROSCOPIC
Bilirubin Urine: NEGATIVE
Glucose, UA: NEGATIVE mg/dL
Hgb urine dipstick: NEGATIVE
Ketones, ur: NEGATIVE mg/dL
Leukocytes,Ua: NEGATIVE
Nitrite: NEGATIVE
Protein, ur: 30 mg/dL — AB
Specific Gravity, Urine: 1.026 (ref 1.005–1.030)
pH: 6 (ref 5.0–8.0)

## 2022-08-13 LAB — COMPREHENSIVE METABOLIC PANEL
ALT: 23 U/L (ref 0–44)
AST: 60 U/L — ABNORMAL HIGH (ref 15–41)
Albumin: 2.3 g/dL — ABNORMAL LOW (ref 3.5–5.0)
Alkaline Phosphatase: 52 U/L (ref 38–126)
Anion gap: 9 (ref 5–15)
BUN: 13 mg/dL (ref 8–23)
CO2: 27 mmol/L (ref 22–32)
Calcium: 8.4 mg/dL — ABNORMAL LOW (ref 8.9–10.3)
Chloride: 101 mmol/L (ref 98–111)
Creatinine, Ser: 0.6 mg/dL — ABNORMAL LOW (ref 0.61–1.24)
GFR, Estimated: >60 mL/min (ref >60)
Glucose, Bld: 122 mg/dL — ABNORMAL HIGH (ref 70–99)
Potassium: 3.3 mmol/L — ABNORMAL LOW (ref 3.5–5.1)
Sodium: 137 mmol/L (ref 135–145)
Total Bilirubin: 0.8 mg/dL (ref 0.3–1.2)
Total Protein: 5 g/dL — ABNORMAL LOW (ref 6.5–8.1)

## 2022-08-13 LAB — PROTIME-INR
INR: 1.3 — ABNORMAL HIGH (ref 0.8–1.2)
Prothrombin Time: 16 seconds — ABNORMAL HIGH (ref 11.4–15.2)

## 2022-08-13 LAB — CULTURE, BLOOD (ROUTINE X 2)
Culture: NO GROWTH
Culture: NO GROWTH

## 2022-08-13 LAB — PROCALCITONIN: Procalcitonin: 1.41 ng/mL

## 2022-08-13 LAB — MAGNESIUM: Magnesium: 1.9 mg/dL (ref 1.7–2.4)

## 2022-08-13 MED ORDER — ADULT MULTIVITAMIN W/MINERALS CH
1.0000 | ORAL_TABLET | Freq: Every day | ORAL | Status: DC
  Administered 2022-08-13 – 2022-08-22 (×8): 1 via ORAL
  Filled 2022-08-13 (×8): qty 1

## 2022-08-13 MED ORDER — LORAZEPAM 1 MG PO TABS: 0.0000 mg | ORAL_TABLET | Freq: Two times a day (BID) | ORAL | Status: DC

## 2022-08-13 MED ORDER — POTASSIUM CHLORIDE CRYS ER 20 MEQ PO TBCR
40.0000 meq | EXTENDED_RELEASE_TABLET | ORAL | Status: AC
  Administered 2022-08-13 (×2): 40 meq via ORAL
  Filled 2022-08-13 (×2): qty 2

## 2022-08-13 MED ORDER — LORAZEPAM 1 MG PO TABS
1.0000 mg | ORAL_TABLET | ORAL | Status: DC | PRN
  Administered 2022-08-14: 2 mg via ORAL
  Filled 2022-08-13: qty 2

## 2022-08-13 MED ORDER — THIAMINE HCL 100 MG/ML IJ SOLN
100.0000 mg | Freq: Every day | INTRAMUSCULAR | Status: DC
  Administered 2022-08-13 – 2022-08-22 (×10): 100 mg via INTRAVENOUS
  Filled 2022-08-13 (×10): qty 2

## 2022-08-13 MED ORDER — LORAZEPAM 2 MG/ML IJ SOLN
1.0000 mg | Freq: Once | INTRAMUSCULAR | Status: AC
  Administered 2022-08-13: 1 mg via INTRAVENOUS
  Filled 2022-08-13: qty 1

## 2022-08-13 MED ORDER — LORAZEPAM 1 MG PO TABS
0.0000 mg | ORAL_TABLET | Freq: Four times a day (QID) | ORAL | Status: DC
  Administered 2022-08-13: 2 mg via ORAL
  Administered 2022-08-14 (×2): 3 mg via ORAL
  Administered 2022-08-14: 1 mg via ORAL
  Administered 2022-08-14: 2 mg via ORAL
  Filled 2022-08-13 (×2): qty 2
  Filled 2022-08-13 (×2): qty 1
  Filled 2022-08-13: qty 3
  Filled 2022-08-13: qty 2

## 2022-08-13 MED ORDER — AZITHROMYCIN 500 MG PO TABS
500.0000 mg | ORAL_TABLET | Freq: Every day | ORAL | Status: DC
  Administered 2022-08-13 – 2022-08-15 (×3): 500 mg via ORAL
  Filled 2022-08-13: qty 1
  Filled 2022-08-13 (×2): qty 2
  Filled 2022-08-13: qty 1

## 2022-08-13 MED ORDER — FOLIC ACID 1 MG PO TABS
1.0000 mg | ORAL_TABLET | Freq: Every day | ORAL | Status: DC
  Administered 2022-08-13 – 2022-08-15 (×3): 1 mg via ORAL
  Filled 2022-08-13 (×4): qty 1

## 2022-08-13 MED ORDER — THIAMINE MONONITRATE 100 MG PO TABS
100.0000 mg | ORAL_TABLET | Freq: Every day | ORAL | Status: DC
  Filled 2022-08-13: qty 1

## 2022-08-13 NOTE — Progress Notes (Signed)
PROGRESS NOTE  James Ibarra:[id number] DOB: 06/13/1950   PCP: Farris Has, MD  Patient is from: Home.  Lives with his wife.  Independently ambulates at baseline.  DOA: 08/12/2022 LOS: 0  Chief complaints Chief Complaint  Patient presents with   Fever     Brief Narrative / Interim history: 72 year old M with PMH of trigeminal neuralgia, facial spasm, HTN and HLD presenting with fever, cough, SOB and weakness for about 3 days, and admitted for severe sepsis due to multifocal pneumonia.  CXR with opacities in RUL and RLL.  Cultures obtained.  Started on ceftriaxone and azithromycin, and admitted.    Subjective: Seen and examined earlier this morning.  No major events overnight of this morning.  Reports improvement in his symptoms.  Stable with intermittent cough.  Cough is mainly dry.  Some chest pain with cough.  Objective: Vitals:   08/12/22 2342 08/13/22 0314 08/13/22 0803 08/13/22 1133  BP: 131/72 107/68 127/70   Pulse: 82 78 75 90  Resp: 18 16 16 16   Temp: 98.5 F (36.9 C) 97.9 F (36.6 C) 98.7 F (37.1 C) (!) 100.7 F (38.2 C)  TempSrc: Oral Oral Oral Oral  SpO2: 94% 91%    Weight:      Height:        Examination:  GENERAL: No apparent distress.  Nontoxic. HEENT: MMM.  Vision and hearing grossly intact.  NECK: Supple.  No apparent JVD.  RESP:  No IWOB.  Fair aeration bilaterally.  Frequently coughing CVS:  RRR. Heart sounds normal.  ABD/GI/GU: BS+. Abd soft, NTND.  MSK/EXT:  Moves extremities. No apparent deformity. No edema.  SKIN: no apparent skin lesion or wound NEURO: Awake, alert and oriented appropriately.  No apparent focal neuro deficit. PSYCH: Calm. Normal affect.   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  Febrile with leukocytosis and lactic acidosis on presentation.  CXR with small RML and RUL infiltrate concerning for multifocal pneumonia.  Sepsis physiology improving.  Lactic acidosis resolved.  Pro-Cal elevated.  Blood cultures NGTD.  Still with significant cough and fever. -Continue ceftriaxone and Zithromax -Mucolytic's and activity -Incentive spirometry/OOB   Hypertension: Normotensive. -Continue home amlodipine -Continue holding Benicar/HCTZ   Hyperlipidemia -Continue home pravastatin   Trigeminal neuralgia - Continue home gabapentin  Hypokalemia -Monitor replenish as appropriate  Body mass index is 20.35 kg/m.           DVT prophylaxis:  enoxaparin (LOVENOX) injection 40 mg Start: 08/12/22 1700  Code Status: Full code Family Communication: None at bedside Level of care: Med-Surg Status is: Observation The patient will require care spanning > 2 midnights and should be moved to inpatient because: Severe sepsis due to multifocal pneumonia   Final disposition: Home Consultants:  None  55 minutes with more than 50% spent in reviewing records, counseling patient/family and coordinating care.   Sch Meds:  Scheduled Meds:  amLODipine  5 mg Oral QAC breakfast   azithromycin  500 mg Oral Daily   enoxaparin (LOVENOX) injection  40 mg Subcutaneous Q24H   gabapentin  600 mg Oral QID   pravastatin  10 mg Oral QHS   sodium chloride flush  3 mL Intravenous Q12H   Continuous Infusions:  cefTRIAXone (ROCEPHIN)  IV Stopped (08/12/22 1622)   PRN Meds:.acetaminophen **OR** acetaminophen, chlorpheniramine-HYDROcodone, guaiFENesin, melatonin, polyethylene glycol  Antimicrobials: Anti-infectives (From admission, onward)    Start     Dose/Rate Route Frequency Ordered Stop   08/13/22 1600  azithromycin (ZITHROMAX) tablet 500 mg        500 mg Oral Daily  08/13/22 1109     08/12/22 1445  cefTRIAXone (ROCEPHIN) 2 g in sodium chloride 0.9 % 100 mL IVPB        2 g 200 mL/hr over 30 Minutes Intravenous Every 24 hours 08/12/22 1430 08/17/22 1444   08/12/22 1445  azithromycin (ZITHROMAX) 500 mg in sodium chloride 0.9 % 250 mL IVPB  Status:  Discontinued        500 mg 250 mL/hr over 60 Minutes Intravenous Every 24 hours 08/12/22 1430 08/13/22 1108        I have personally reviewed the following labs and images: CBC: Recent Labs  Lab 08/12/22 1445 08/13/22 0251  WBC 15.0* 11.6*  NEUTROABS 14.3*  --   HGB 13.5 11.1*  HCT 40.6 33.0*  MCV 96.0 92.7  PLT 214 190   BMP &GFR Recent Labs  Lab 08/12/22 1445 08/13/22 0250 08/13/22 0251  NA 132*  --  137  K 3.5  --  3.3*  CL 98  --  101  CO2 19*  --  27  GLUCOSE 185*  --  122*  BUN 18  --  13  CREATININE 1.12  --  0.60*  CALCIUM 8.4*  --  8.4*  MG  --  1.9  --    Estimated Creatinine Clearance: 86.1 mL/min (A) (by C-G formula based on SCr of 0.6 mg/dL (L)). Liver & Pancreas: Recent Labs  Lab 08/12/22 1445 08/13/22 0251  AST 40 60*  ALT 14 23  ALKPHOS 54 52  BILITOT 1.2 0.8  PROT 6.2* 5.0*  ALBUMIN 2.9* 2.3*   No results for input(s): "LIPASE", "AMYLASE" in the last 168 hours. No results for input(s): "AMMONIA" in the last 168 hours. Diabetic: No results for input(s): "HGBA1C" in the last 72 hours. No results for input(s): "GLUCAP" in the last 168 hours. Cardiac Enzymes: No results for input(s): "CKTOTAL", "CKMB", "CKMBINDEX", "TROPONINI" in the last 168 hours. No results for input(s): "PROBNP" in the last 8760 hours. Coagulation Profile: Recent Labs  Lab 08/13/22 0251  INR 1.3*   Thyroid Function Tests: No results for input(s): "TSH", "T4TOTAL", "FREET4", "T3FREE", "THYROIDAB" in the last 72 hours. Lipid Profile: No results for input(s): "CHOL", "HDL", "LDLCALC", "TRIG", "CHOLHDL", "LDLDIRECT" in the last 72 hours. Anemia Panel: No results for input(s):  "VITAMINB12", "FOLATE", "FERRITIN", "TIBC", "IRON", "RETICCTPCT" in the last 72 hours. Urine analysis:    Component Value Date/Time   COLORURINE AMBER (A) 08/13/2022 0930   APPEARANCEUR CLEAR 08/13/2022 0930   LABSPEC 1.026 08/13/2022 0930   PHURINE 6.0 08/13/2022 0930   GLUCOSEU NEGATIVE 08/13/2022 0930   HGBUR NEGATIVE 08/13/2022 0930   BILIRUBINUR NEGATIVE 08/13/2022 0930   KETONESUR NEGATIVE 08/13/2022 0930   PROTEINUR 30 (A) 08/13/2022 0930   NITRITE NEGATIVE 08/13/2022 0930   LEUKOCYTESUR NEGATIVE 08/13/2022 0930   Sepsis Labs: Invalid input(s): "PROCALCITONIN", "LACTICIDVEN"  Microbiology: Recent Results (from the past 240 hour(s))  Resp panel by RT-PCR (RSV, Flu A&B, Covid) Anterior Nasal Swab     Status: None   Collection Time: 08/12/22  2:30 PM   Specimen: Anterior Nasal Swab  Result Value Ref  Range Status   SARS Coronavirus 2 by RT PCR NEGATIVE NEGATIVE Final   Influenza A by PCR NEGATIVE NEGATIVE Final   Influenza B by PCR NEGATIVE NEGATIVE Final    Comment: (NOTE) The Xpert Xpress SARS-CoV-2/FLU/RSV plus assay is intended as an aid in the diagnosis of influenza from Nasopharyngeal swab specimens and should not be used as a sole basis for treatment. Nasal washings and aspirates are unacceptable for Xpert Xpress SARS-CoV-2/FLU/RSV testing.  Fact Sheet for Patients: BloggerCourse.com  Fact Sheet for Healthcare Providers: SeriousBroker.it  This test is not yet approved or cleared by the [place] FDA and has been authorized for detection and/or diagnosis of SARS-CoV-2 by FDA under an Emergency Use Authorization (EUA). This EUA will remain in effect (meaning this test can be used) for the duration of the COVID-19 declaration under Section 564(b)(1) of the Act, 21 U.S.C. section 360bbb-3(b)(1), unless the authorization is terminated or revoked.     Resp Syncytial Virus by PCR NEGATIVE NEGATIVE Final     Comment: (NOTE) Fact Sheet for Patients: BloggerCourse.com  Fact Sheet for Healthcare Providers: SeriousBroker.it  This test is not yet approved or cleared by the [place] FDA and has been authorized for detection and/or diagnosis of SARS-CoV-2 by FDA under an Emergency Use Authorization (EUA). This EUA will remain in effect (meaning this test can be used) for the duration of the COVID-19 declaration under Section 564(b)(1) of the Act, 21 U.S.C. section 360bbb-3(b)(1), unless the authorization is terminated or revoked.  Performed at [facility], [address], [place], [place] [address]   Blood Culture (routine x 2)     Status: None (Preliminary result)   Collection Time: 08/12/22  3:10 PM   Specimen: BLOOD  Result Value Ref Range Status   Specimen Description BLOOD LEFT ANTECUBITAL  Final   Special Requests   Final    BOTTLES DRAWN AEROBIC AND ANAEROBIC Blood Culture results may not be optimal due to an excessive volume of blood received in culture bottles   Culture   Final    NO GROWTH < 24 HOURS Performed at [facility], [address], [place], [place] [address]    Report Status PENDING  Incomplete  Blood Culture (routine x 2)     Status: None (Preliminary result)   Collection Time: 08/12/22  3:20 PM   Specimen: BLOOD RIGHT FOREARM  Result Value Ref Range Status   Specimen Description BLOOD RIGHT FOREARM  Final   Special Requests   Final    BOTTLES DRAWN AEROBIC AND ANAEROBIC Blood Culture adequate volume   Culture   Final    NO GROWTH < 24 HOURS Performed at [facility], [address] [place], [place] [address]    Report Status PENDING  Incomplete    Radiology Studies: DG Chest 2 View  Result Date: 08/12/2022 CLINICAL DATA:  Cough, concern for pneumonia. EXAM: CHEST - 2 VIEW COMPARISON:  Chest radiograph 05/23/2018 FINDINGS: The heart is at the upper limits of normal for size, likely  exaggerated by AP technique. The upper mediastinal contours are normal allowing for rightward patient rotation. There is confluent opacity in the right upper lobe along the fissure. As well as patchy opacities in the right lower lobe. The left lung is clear. There is no significant pleural effusion. There is no pneumothorax. There is no acute osseous abnormality. IMPRESSION: Opacities in the right upper and lower lobes consistent with multifocal pneumonia. Recommend follow-up radiographs in 6-8 weeks to  ensure resolution. Electronically Signed   By: Lesia Hausen M.D.   On: 08/12/2022 15:22      Tijuan Dantes T. Pattie Flaharty [facility]  If 7PM-7AM, please contact night-coverage [email or web address] 08/13/2022, 12:28 PM

## 2022-08-14 DIAGNOSIS — G5132 Clonic hemifacial spasm, left: Secondary | ICD-10-CM | POA: Diagnosis not present

## 2022-08-14 DIAGNOSIS — J189 Pneumonia, unspecified organism: Secondary | ICD-10-CM | POA: Diagnosis not present

## 2022-08-14 DIAGNOSIS — G9341 Metabolic encephalopathy: Secondary | ICD-10-CM | POA: Insufficient documentation

## 2022-08-14 DIAGNOSIS — F10939 Alcohol use, unspecified with withdrawal, unspecified: Secondary | ICD-10-CM | POA: Insufficient documentation

## 2022-08-14 DIAGNOSIS — E785 Hyperlipidemia, unspecified: Secondary | ICD-10-CM | POA: Diagnosis not present

## 2022-08-14 DIAGNOSIS — A419 Sepsis, unspecified organism: Secondary | ICD-10-CM | POA: Diagnosis not present

## 2022-08-14 DIAGNOSIS — I341 Nonrheumatic mitral (valve) prolapse: Secondary | ICD-10-CM | POA: Insufficient documentation

## 2022-08-14 DIAGNOSIS — I340 Nonrheumatic mitral (valve) insufficiency: Secondary | ICD-10-CM | POA: Insufficient documentation

## 2022-08-14 DIAGNOSIS — I480 Paroxysmal atrial fibrillation: Secondary | ICD-10-CM | POA: Insufficient documentation

## 2022-08-14 LAB — COMPREHENSIVE METABOLIC PANEL
ALT: 31 U/L (ref 0–44)
AST: 61 U/L — ABNORMAL HIGH (ref 15–41)
Albumin: 2.5 g/dL — ABNORMAL LOW (ref 3.5–5.0)
Alkaline Phosphatase: 76 U/L (ref 38–126)
Anion gap: 12 (ref 5–15)
BUN: 9 mg/dL (ref 8–23)
CO2: 24 mmol/L (ref 22–32)
Calcium: 8.3 mg/dL — ABNORMAL LOW (ref 8.9–10.3)
Chloride: 96 mmol/L — ABNORMAL LOW (ref 98–111)
Creatinine, Ser: 0.72 mg/dL (ref 0.61–1.24)
GFR, Estimated: >60 mL/min (ref >60)
Glucose, Bld: 101 mg/dL — ABNORMAL HIGH (ref 70–99)
Potassium: 3.5 mmol/L (ref 3.5–5.1)
Sodium: 132 mmol/L — ABNORMAL LOW (ref 135–145)
Total Bilirubin: 1.3 mg/dL — ABNORMAL HIGH (ref 0.3–1.2)
Total Protein: 5.9 g/dL — ABNORMAL LOW (ref 6.5–8.1)

## 2022-08-14 LAB — CBC
HCT: 35.6 % — ABNORMAL LOW (ref 39.0–52.0)
Hemoglobin: 12 g/dL — ABNORMAL LOW (ref 13.0–17.0)
MCH: 31.7 pg (ref 26.0–34.0)
MCHC: 33.7 g/dL (ref 30.0–36.0)
MCV: 93.9 fL (ref 80.0–100.0)
Platelets: 197 10*3/uL (ref 150–400)
RBC: 3.79 MIL/uL — ABNORMAL LOW (ref 4.22–5.81)
RDW: 13.2 % (ref 11.5–15.5)
WBC: 13.8 10*3/uL — ABNORMAL HIGH (ref 4.0–10.5)
nRBC: 0 % (ref 0.0–0.2)

## 2022-08-14 LAB — CULTURE, BLOOD (ROUTINE X 2)

## 2022-08-14 LAB — PROCALCITONIN: Procalcitonin: 0.95 ng/mL

## 2022-08-14 LAB — TROPONIN I (HIGH SENSITIVITY): Troponin I (High Sensitivity): 80 ng/L — ABNORMAL HIGH (ref <18)

## 2022-08-14 LAB — PHOSPHORUS: Phosphorus: 2 mg/dL — ABNORMAL LOW (ref 2.5–4.6)

## 2022-08-14 LAB — CK: Total CK: 928 U/L — ABNORMAL HIGH (ref 49–397)

## 2022-08-14 LAB — MAGNESIUM: Magnesium: 1.8 mg/dL (ref 1.7–2.4)

## 2022-08-14 MED ORDER — DILTIAZEM HCL 30 MG PO TABS
30.0000 mg | ORAL_TABLET | Freq: Four times a day (QID) | ORAL | Status: DC
  Administered 2022-08-14 – 2022-08-15 (×5): 30 mg via ORAL
  Filled 2022-08-14 (×4): qty 1

## 2022-08-14 MED ORDER — DILTIAZEM HCL-DEXTROSE 125-5 MG/125ML-% IV SOLN (PREMIX)
5.0000 mg/h | INTRAVENOUS | Status: DC
  Administered 2022-08-14: 5 mg/h via INTRAVENOUS
  Filled 2022-08-14: qty 125

## 2022-08-14 MED ORDER — METOPROLOL TARTRATE 5 MG/5ML IV SOLN
2.5000 mg | INTRAVENOUS | Status: DC | PRN
  Administered 2022-08-15 – 2022-08-16 (×4): 2.5 mg via INTRAVENOUS
  Filled 2022-08-14 (×4): qty 5

## 2022-08-14 MED ORDER — POTASSIUM CHLORIDE IN NACL 20-0.9 MEQ/L-% IV SOLN
INTRAVENOUS | Status: DC
  Filled 2022-08-14 (×3): qty 1000

## 2022-08-14 MED ORDER — MAGNESIUM SULFATE 2 GM/50ML IV SOLN
2.0000 g | Freq: Once | INTRAVENOUS | Status: AC
  Administered 2022-08-14: 2 g via INTRAVENOUS
  Filled 2022-08-14: qty 50

## 2022-08-14 MED ORDER — POTASSIUM CHLORIDE CRYS ER 20 MEQ PO TBCR
40.0000 meq | EXTENDED_RELEASE_TABLET | Freq: Once | ORAL | Status: AC
  Administered 2022-08-14: 40 meq via ORAL
  Filled 2022-08-14: qty 2

## 2022-08-14 NOTE — Evaluation (Signed)
Occupational Therapy Evaluation Patient Details Name: James Ibarra MRN: [id number] DOB: December 06, 1950 Today's Date: 08/14/2022   History of Present Illness Pt is a 72 y/o M presenting to ED from home on 6/8 with weakness, admitted for severe sepsis 2/2 multifocal PNA. PMH includes trigeminal neuralgia, facial spasm, HTN and HLD   Clinical Impression   Per chart review, pt lives with spouse, and is ind at baseline with mobility, pt currently only oriented to self, follows <10% of commands during session. Pt needing set up - max A for ADLs, max A +2 for bed mobility,and min A +2 for standing at EOB. Pt presenting with impairments listed below, will follow acutely. Patient will benefit from continued inpatient follow up therapy, <3 hours/day to maximize safety/ind with ADLs/functional mobility.       Recommendations for follow up therapy are one component of a multi-disciplinary discharge planning process, led by the attending physician.  Recommendations may be updated based on patient status, additional functional criteria and insurance authorization.   Assistance Recommended at Discharge Frequent or constant Supervision/Assistance  Patient can return home with the following Two people to help with walking and/or transfers;A lot of help with bathing/dressing/bathroom;Assistance with cooking/housework;Assistance with feeding;Direct supervision/assist for financial management;Direct supervision/assist for medications management;Assist for transportation;Help with stairs or ramp for entrance    Functional Status Assessment  Patient has had a recent decline in their functional status and demonstrates the ability to make significant improvements in function in a reasonable and predictable amount of time.  Equipment Recommendations  Other (comment) (defer)    Recommendations for Other Services PT consult     Precautions / Restrictions Precautions Precautions: Fall Restrictions Weight Bearing  Restrictions: No      Mobility Bed Mobility Overal bed mobility: Needs Assistance Bed Mobility: Sidelying to Sit, Sit to Supine   Sidelying to sit: Max assist, +2 for physical assistance   Sit to supine: Max assist, +2 for physical assistance        Transfers Overall transfer level: Needs assistance Equipment used: 2 person hand held assist Transfers: Sit to/from Stand Sit to Stand: Min assist, +2 physical assistance                  Balance Overall balance assessment: Needs assistance Sitting-balance support: Feet supported Sitting balance-Leahy Scale: Poor Sitting balance - Comments: R lateral lean in sitting   Standing balance support: During functional activity, Reliant on assistive device for balance                               ADL either performed or assessed with clinical judgement   ADL Overall ADL's : Needs assistance/impaired Eating/Feeding: Set up;Sitting   Grooming: Sitting;Minimal assistance   Upper Body Bathing: Maximal assistance;Bed level   Lower Body Bathing: Maximal assistance   Upper Body Dressing : Maximal assistance   Lower Body Dressing: Maximal assistance;Sitting/lateral leans   Toilet Transfer: Maximal assistance;Ambulation   Toileting- Clothing Manipulation and Hygiene: Maximal assistance       Functional mobility during ADLs: Maximal assistance;+2 for physical assistance       Vision Baseline Vision/History: 1 Wears glasses Additional Comments: will further assess     Perception Perception Perception Tested?: No   Praxis Praxis Praxis tested?: Not tested    Pertinent Vitals/Pain Pain Assessment Pain Assessment: No/denies pain     Hand Dominance     Extremity/Trunk Assessment Upper Extremity Assessment Upper Extremity Assessment: Generalized weakness  Lower Extremity Assessment Lower Extremity Assessment: Defer to PT evaluation   Cervical / Trunk Assessment Cervical / Trunk Assessment:  Kyphotic   Communication Communication Communication: No difficulties   Cognition Arousal/Alertness: Lethargic   Overall Cognitive Status: Difficult to assess                                 General Comments: pt oriented to self, only follows <10% of commands during session     General Comments  VSS on RA    Exercises     Shoulder Instructions      Home Living Family/patient expects to be discharged to:: Private residence Living Arrangements: Spouse/significant other                                      Prior Functioning/Environment Prior Level of Function : Independent/Modified Independent             Mobility Comments: amb without AD per chart review          OT Problem List: Decreased strength;Decreased range of motion;Decreased activity tolerance;Impaired balance (sitting and/or standing);Decreased cognition;Decreased safety awareness;Decreased coordination;Cardiopulmonary status limiting activity      OT Treatment/Interventions: Self-care/ADL training;Therapeutic exercise;Energy conservation;DME and/or AE instruction;Therapeutic activities;Patient/family education;Balance training    OT Goals(Current goals can be found in the care plan section) Acute Rehab OT Goals Patient Stated Goal: none stated OT Goal Formulation: With patient Time For Goal Achievement: 08/28/22 Potential to Achieve Goals: Good ADL Goals Pt Will Perform Grooming: with supervision;sitting Pt Will Perform Upper Body Dressing: with min assist;sitting Pt Will Transfer to Toilet: with min assist;squat pivot transfer;stand pivot transfer;bedside commode  OT Frequency: Min 1X/week    Co-evaluation PT/OT/SLP Co-Evaluation/Treatment: Yes Reason for Co-Treatment: Complexity of the patient's impairments (multi-system involvement);To address functional/ADL transfers;For patient/therapist safety   OT goals addressed during session: ADL's and self-care       AM-PAC OT "6 Clicks" Daily Activity     Outcome Measure Help from another person eating meals?: A Little Help from another person taking care of personal grooming?: A Little Help from another person toileting, which includes using toliet, bedpan, or urinal?: A Lot Help from another person bathing (including washing, rinsing, drying)?: A Lot Help from another person to put on and taking off regular upper body clothing?: A Lot Help from another person to put on and taking off regular lower body clothing?: A Lot 6 Click Score: 14   End of Session Equipment Utilized During Treatment: Gait belt Nurse Communication: Mobility status  Activity Tolerance: Patient tolerated treatment well Patient left: in bed;with call bell/phone within reach;with bed alarm set  OT Visit Diagnosis: Unsteadiness on feet (R26.81);Other abnormalities of gait and mobility (R26.89);Muscle weakness (generalized) (M62.81)                Time: 9147-8295 OT Time Calculation (min): 17 min Charges:  OT General Charges $OT Visit: 1 Visit OT Evaluation $OT Eval Moderate Complexity: 1 Mod  Charleton Deyoung K, OTD, OTR/L SecureChat Preferred Acute Rehab [phone]   Carver Fila Koonce 08/14/2022, 12:48 PM

## 2022-08-14 NOTE — Progress Notes (Signed)
PROGRESS NOTE  James Ibarra ZOX:[id number] DOB: 02-Jan-1951   PCP: Farris Has, MD  Patient is from: Home.  Lives with his wife.  Independently ambulates at baseline.  DOA: 08/12/2022 LOS: 1  Chief complaints Chief Complaint  Patient presents with   Fever     Brief Narrative / Interim history: 72 year old M with PMH of severe MR/mitral valve prolapse, alcohol use disorder, trigeminal neuralgia, facial spasm, HTN and HLD presenting with fever, cough, SOB and weakness for about 3 days, and admitted for severe sepsis due to multifocal pneumonia.  CXR with opacities in RUL and RLL.  Cultures obtained.  Started on ceftriaxone and azithromycin, and admitted.  The next day, became delirious and confused.  Started on CIWA due to alcohol withdrawal.  Per patient's wife, patient drinks liquor.  Patient also went into A-fib with RVR and started on Cardizem drip, but converted to sinus rhythm.     Subjective: Seen and examined earlier this morning.  Patient went into A-fib with RVR earlier this morning and started on Cardizem drip.  Converted to sinus rhythm this morning.  He is somewhat groggy and sleepy and not able to hold conversation.  Objective: Vitals:   08/14/22 0645 08/14/22 0731 08/14/22 0758 08/14/22 0800  BP: (!) 129/101 (!) 132/92  (!) 141/66  Pulse: (!) 150 (!) 106 84 88  Resp:  20    Temp:  97.8 F (36.6 C)    TempSrc:  Oral    SpO2:  98% 97% 96%  Weight:      Height:        Examination: GENERAL: No apparent distress.  Nontoxic. HEENT: MMM.  Vision and hearing grossly intact.  NECK: Supple.  No apparent JVD.  RESP:  No IWOB.  Fair aeration bilaterally. CVS:  RRR. Heart sounds normal.  ABD/GI/GU: BS+. Abd soft, NTND.  MSK/EXT:   No apparent deformity. Moves extremities. No edema.  SKIN: no apparent skin lesion or wound NEURO: Somewhat sleepy and groggy.  Not able to hold conversation.  No apparent focal neuro deficit. PSYCH: Calm.   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  Febrile with leukocytosis and lactic acidosis on presentation.  CXR with small RML and RUL infiltrate concerning for multifocal pneumonia.  Sepsis physiology improving.  Lactic acidosis resolved.  Pro-Cal elevated.  Blood cultures NGTD.  Sepsis physiology resolving. -Continue ceftriaxone and Zithromax -Mucolytic's and activity -Incentive spirometry/OOB  Paroxysmal A-fib with RVR: New onset A-fib with RVR to 150 earlier this morning.  Started on Cardizem drip.  Converted to sinus rhythm.  Likely provoked in the setting of the above and alcohol use.  TTE on 4/26 with normal LVEF but severe mitral valve prolapse and mitral valve regurgitation.  CHA2DS2-VASc score 2. -Wean off Cardizem and transition to p.o. -Cardiology, Dr. Welton Flakes consulted overnight and recommended outpatient follow-up -Hold off anticoagulation. -Optimize electrolytes  Severe mitral valve regurgitation/mitral valve prolapse -Outpatient follow-up with cardiology as above  Alcohol withdrawal: Patient developed delirium  and confusion on 6/9.  Per wife, patient drinks liquor daily. -Continue CIWA with as needed Ativan -Continue multivitamin, folic acid and thiamine  Acute metabolic encephalopathy: Multifactorial including sepsis, alcohol withdrawal and Ativan. -Reorientation and delirium precaution -PT/OT   Hypertension: Normotensive. -Switch amlodipine to  Cardizem -Continue holding Benicar/HCTZ   Hyperlipidemia -Continue home pravastatin   Trigeminal neuralgia - Continue home gabapentin  Hypokalemia -Monitor replenish as appropriate  Body mass index is 20.35 kg/m.           DVT prophylaxis:  enoxaparin (LOVENOX) injection 40 mg Start: 08/12/22 1700  Code Status: Full code Family Communication: None at bedside Level of care: Progressive Status is: Inpatient The patient will remain inpatient because: Severe sepsis due to multifocal pneumonia, atrial fibrillation with RVR and alcohol withdrawal   Final disposition: Home Consultants:  None  55 minutes with more than 50% spent in reviewing records, counseling patient/family and coordinating care.   Sch Meds:  Scheduled Meds:  azithromycin  500 mg Oral Daily   enoxaparin (LOVENOX) injection  40 mg Subcutaneous Q24H   folic acid  1 mg Oral Daily   gabapentin  600 mg Oral QID   LORazepam  0-4 mg Oral Q6H   Followed by   Melene Muller ON 08/15/2022] LORazepam  0-4 mg Oral Q12H   multivitamin with minerals  1 tablet Oral Daily   pravastatin  10 mg Oral QHS   sodium chloride flush  3 mL Intravenous Q12H   thiamine  100 mg Oral Daily   Or   thiamine  100 mg Intravenous Daily   Continuous Infusions:  cefTRIAXone (ROCEPHIN)  IV Stopped (08/13/22 1515)   diltiazem (CARDIZEM) infusion 10 mg/hr (08/14/22 1007)   PRN Meds:.acetaminophen **OR** acetaminophen, chlorpheniramine-HYDROcodone, guaiFENesin, LORazepam, melatonin, polyethylene glycol  Antimicrobials: Anti-infectives (From admission, onward)    Start     Dose/Rate Route Frequency Ordered Stop   08/13/22 1600  azithromycin (ZITHROMAX) tablet 500 mg        500 mg Oral Daily 08/13/22 1109     08/12/22 1445  cefTRIAXone (ROCEPHIN) 2 g in sodium chloride 0.9 % 100 mL IVPB        2 g 200 mL/hr over 30 Minutes Intravenous Every 24 hours 08/12/22 1430 08/17/22 1444   08/12/22 1445  azithromycin (ZITHROMAX) 500 mg in sodium  chloride 0.9 % 250 mL IVPB  Status:  Discontinued        500 mg 250 mL/hr over 60 Minutes Intravenous Every 24 hours 08/12/22 1430 08/13/22 1108        I have personally reviewed the following labs and images: CBC: Recent Labs  Lab 08/12/22 1445 08/13/22 0251 08/14/22 0339  WBC 15.0* 11.6* 13.8*  NEUTROABS 14.3*  --   --   HGB 13.5 11.1* 12.0*  HCT 40.6 33.0* 35.6*  MCV 96.0 92.7 93.9  PLT 214 190 197   BMP &GFR Recent Labs  Lab 08/12/22 1445 08/13/22 0250 08/13/22 0251 08/14/22 0339  NA 132*  --  137 132*  K 3.5  --  3.3* 3.5  CL 98  --  101 96*  CO2 19*  --  27 24  GLUCOSE 185*  --  122* 101*  BUN 18  --  13 9  CREATININE 1.12  --  0.60* 0.72  CALCIUM 8.4*  --  8.4* 8.3*  MG  --  1.9  --  1.8  PHOS  --   --   --  2.0*   Estimated Creatinine Clearance: 86.1 mL/min (  by C-G formula based on SCr of 0.72 mg/dL). Liver & Pancreas: Recent Labs  Lab 08/12/22 1445 08/13/22 0251 08/14/22 0339  AST 40 60* 61*  ALT 14 23 31   ALKPHOS 54 52 76  BILITOT 1.2 0.8 1.3*  PROT 6.2* 5.0* 5.9*  ALBUMIN 2.9* 2.3* 2.5*   No results for input(s): "LIPASE", "AMYLASE" in the last 168 hours. No results for input(s): "AMMONIA" in the last 168 hours. Diabetic: No results for input(s): "HGBA1C" in the last 72 hours. No results for input(s): "GLUCAP" in the last 168 hours. Cardiac Enzymes: Recent Labs  Lab 08/14/22 0339  CKTOTAL 928*   No results for input(s): "PROBNP" in the last 8760 hours. Coagulation Profile: Recent Labs  Lab 08/13/22 0251  INR 1.3*   Thyroid Function Tests: No results for input(s): "TSH", "T4TOTAL", "FREET4", "T3FREE", "THYROIDAB" in the last 72 hours. Lipid Profile: No results for input(s): "CHOL", "HDL", "LDLCALC", "TRIG", "CHOLHDL", "LDLDIRECT" in the last 72 hours. Anemia Panel: No results for input(s): "VITAMINB12", "FOLATE", "FERRITIN", "TIBC", "IRON", "RETICCTPCT" in the last 72 hours. Urine analysis:    Component Value Date/Time    COLORURINE AMBER (A) 08/13/2022 0930   APPEARANCEUR CLEAR 08/13/2022 0930   LABSPEC 1.026 08/13/2022 0930   PHURINE 6.0 08/13/2022 0930   GLUCOSEU NEGATIVE 08/13/2022 0930   HGBUR NEGATIVE 08/13/2022 0930   BILIRUBINUR NEGATIVE 08/13/2022 0930   KETONESUR NEGATIVE 08/13/2022 0930   PROTEINUR 30 (A) 08/13/2022 0930   NITRITE NEGATIVE 08/13/2022 0930   LEUKOCYTESUR NEGATIVE 08/13/2022 0930   Sepsis Labs: Invalid input(s): "PROCALCITONIN", "LACTICIDVEN"  Microbiology: Recent Results (from the past 240 hour(s))  Resp panel by RT-PCR (RSV, Flu A&B, Covid) Anterior Nasal Swab     Status: None   Collection Time: 08/12/22  2:30 PM   Specimen: Anterior Nasal Swab  Result Value Ref Range Status   SARS Coronavirus 2 by RT PCR NEGATIVE NEGATIVE Final   Influenza A by PCR NEGATIVE NEGATIVE Final   Influenza B by PCR NEGATIVE NEGATIVE Final    Comment: (NOTE) The Xpert Xpress SARS-CoV-2/FLU/RSV plus assay is intended as an aid in the diagnosis of influenza from Nasopharyngeal swab specimens and should not be used as a sole basis for treatment. Nasal washings and aspirates are unacceptable for Xpert Xpress SARS-CoV-2/FLU/RSV testing.  Fact Sheet for Patients: BloggerCourse.com  Fact Sheet for Healthcare Providers: SeriousBroker.it  This test is not yet approved or cleared by the [place] FDA and has been authorized for detection and/or diagnosis of SARS-CoV-2 by FDA under an Emergency Use Authorization (EUA). This EUA will remain in effect (meaning this test can be used) for the duration of the COVID-19 declaration under Section 564(b)(1) of the Act, 21 U.S.C. section 360bbb-3(b)(1), unless the authorization is terminated or revoked.     Resp Syncytial Virus by PCR NEGATIVE NEGATIVE Final    Comment: (NOTE) Fact Sheet for Patients: BloggerCourse.com  Fact Sheet for Healthcare  Providers: SeriousBroker.it  This test is not yet approved or cleared by the [place] FDA and has been authorized for detection and/or diagnosis of SARS-CoV-2 by FDA under an Emergency Use Authorization (EUA). This EUA will remain in effect (meaning this test can be used) for the duration of the COVID-19 declaration under Section 564(b)(1) of the Act, 21 U.S.C. section 360bbb-3(b)(1), unless the authorization is terminated or revoked.  Performed at [facility], [address] [place], [place] [address]   Blood Culture (routine x 2)     Status: None (Preliminary result)  Collection Time: 08/12/22  3:10 PM   Specimen: BLOOD  Result Value Ref Range Status   Specimen Description BLOOD LEFT ANTECUBITAL  Final   Special Requests   Final    BOTTLES DRAWN AEROBIC AND ANAEROBIC Blood Culture results may not be optimal due to an excessive volume of blood received in culture bottles   Culture   Final    NO GROWTH 2 DAYS Performed at [facility], [address] [place], [place] [address]    Report Status PENDING  Incomplete  Blood Culture (routine x 2)     Status: None (Preliminary result)   Collection Time: 08/12/22  3:20 PM   Specimen: BLOOD RIGHT FOREARM  Result Value Ref Range Status   Specimen Description BLOOD RIGHT FOREARM  Final   Special Requests   Final    BOTTLES DRAWN AEROBIC AND ANAEROBIC Blood Culture adequate volume   Culture   Final    NO GROWTH 2 DAYS Performed at [facility], [address] [place], [place] [address]    Report Status PENDING  Incomplete    Radiology Studies: No results found.    Areanna Gengler T. Suleman Gunning [facility]  If 7PM-7AM, please contact night-coverage [email or web address] 08/14/2022, 11:31 AM

## 2022-08-14 NOTE — TOC Initial Note (Signed)
Transition of Care [facility]) - Initial/Assessment Note    Patient Details  Name: James Ibarra MRN: [id number] Date of Birth: 1950-03-16  Transition of [facility]) CM/SW Contact:    Delilah Shan, LCSWA Phone Number: 08/14/2022, 5:05 PM  Clinical Narrative:                  CSW received consult for possible SNF placement at time of discharge. Due to patients current orientation CSW spoke with patients spouse [facility] regarding PT recommendation of SNF placement at time of discharge. Patients spouse reports PTA patient comes from home with her.Patients spouse  expressed understanding of PT recommendation and is agreeable to SNF placement for patient at time of discharge. Patients spouse gave CSW permission to fax out initial referral near the [place] area for possible SNF placement. CSW discussed insurance authorization process and will provided Medicare SNF ratings list with accepted SNF bed offers when available. . Patient expressed being hopeful for rehab and to feel better soon. No further questions reported at this time. CSW to continue to follow and assist with discharge planning needs.    Barriers to Discharge: Continued Medical Work up   Patient Goals and CMS Choice     Choice offered to / list presented to : Spouse      Expected Discharge Plan and Services In-house Referral: Clinical Social Work     Living arrangements for the past 2 months: Single Family Home                                      Prior Living Arrangements/Services Living arrangements for the past 2 months: Single Family Home Lives with:: Spouse Patient language and need for interpreter reviewed:: Yes Do you feel safe going back to the place where you live?: No   SNF  Need for Family Participation in Patient Care: Yes (Comment) Care giver support system in place?: Yes (comment)   Criminal Activity/Legal Involvement Pertinent to Current Situation/Hospitalization: No - Comment as  needed  Activities of Daily Living Home Assistive Devices/Equipment: None ADL Screening (condition at time of admission) Patient's cognitive ability adequate to safely complete daily activities?: Yes Is the patient deaf or have difficulty hearing?: No Does the patient have difficulty seeing, even when wearing glasses/contacts?: No Does the patient have difficulty concentrating, remembering, or making decisions?: No Patient able to express need for assistance with ADLs?: Yes Does the patient have difficulty dressing or bathing?: No Independently performs ADLs?: Yes (appropriate for developmental age) Does the patient have difficulty walking or climbing stairs?: No Weakness of Legs: None Weakness of Arms/Hands: None  Permission Sought/Granted Permission sought to share information with : Case Manager, Magazine features editor, Family Supports Permission granted to share information with : No  Share Information with NAME: Due to patients current orientation CSW spoke with patients spouse [facility]  Permission granted to share info w AGENCY: Due to patients current orientation CSW spoke with patients spouse Holli/SNF  Permission granted to share info w Relationship: Due to patients current orientation CSW spoke with patients spouse [facility]  Permission granted to share info w Contact Information: Due to patients current orientation CSW spoke with patients spouse [facility] [phone]  Emotional Assessment       Orientation: : Oriented to Self Alcohol / Substance Use: Not Applicable Psych Involvement: No (comment)  Admission diagnosis:  Shortness of breath [R06.02] Septic shock (HCC) [A41.9, R65.21] Sepsis (HCC) [A41.9]  Fever, unspecified fever cause [R50.9] Multifocal pneumonia [J18.9] Severe sepsis (HCC) [A41.9, R65.20] Patient Active Problem List   Diagnosis Date Noted   Alcohol withdrawal (HCC) 08/14/2022   Acute metabolic encephalopathy 08/14/2022   Paroxysmal atrial  fibrillation with RVR (HCC) 08/14/2022   Severe mitral valve regurgitation 08/14/2022   Mitral valve prolapse 08/14/2022   PNA (pneumonia) 08/12/2022   Severe sepsis (HCC) 08/12/2022   HLD (hyperlipidemia) 08/12/2022   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   Trigeminal neuralgia of left side of face 07/15/2019   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 [place] [address] Phone: [phone] Fax: [phone]  [facility] [place], [place] - [address] [place] [address] Phone: [phone] Fax: [phone]  [facility] - [place], [place] - [address] [place] [address] Phone: [phone] Fax: [phone]     Social Determinants of Health (SDOH) Social History: SDOH Screenings   Food Insecurity: No Food Insecurity (08/12/2022)  Housing: Low Risk  (08/12/2022)  Transportation Needs: No Transportation Needs (08/12/2022)  Utilities: Not At Risk (08/12/2022)  Tobacco Use: Low Risk  (08/12/2022)   SDOH Interventions:     Readmission Risk Interventions     No data to display

## 2022-08-14 NOTE — Progress Notes (Signed)
Overnight event  Patient is a 72 year old male with past medical history of trigeminal neuralgia, facial spasm, hypertension, hyperlipidemia admitted on 6/8 for severe sepsis due to multifocal pneumonia.  He is also on CIWA protocol and receiving Ativan for alcohol withdrawal. Noted to be tachycardic to the 140-150s this morning.  Blood pressure 146/70.  EKG showing A-fib with RVR, ST depressions in lateral leads.  Patient resting comfortably and denies chest pain, palpitations, or shortness of breath.  No documented history of A-fib.  Echo done 06/30/2022 showing EF 60 to 65%, severe prolapse of posterior MV leaflet with severe mitral valve regurgitation. -Cardiac monitoring -Start Cardizem drip -Check troponin -Discussed case with on-call cardiologist Dr. Welton Flakes.  He recommends outpatient follow-up regarding valvular issues.

## 2022-08-14 NOTE — NC FL2 (Signed)
[place] MEDICAID FL2 LEVEL OF CARE FORM     IDENTIFICATION  Patient Name: James Ibarra Birthdate: 1950/09/11 Sex: male Admission Date (Current Location): 08/12/2022  [facility] and [place] Number:  Producer, television/film/video and Address:  [facility] [place], [address] [place], [place] [address]      Provider Number: [id number]  Attending Physician Name and Address:  Almon Hercules, MD  Relative Name and Phone Number:  Drue Second (Spouse) [phone]    Current Level of Care: Hospital Recommended Level of Care: Skilled Nursing Facility Prior Approval Number:    Date Approved/Denied:   PASRR Number:    Discharge Plan: SNF    Current Diagnoses: Patient Active Problem List   Diagnosis Date Noted   Alcohol withdrawal (HCC) 08/14/2022   Acute metabolic encephalopathy 08/14/2022   Paroxysmal atrial fibrillation with RVR (HCC) 08/14/2022   Severe mitral valve regurgitation 08/14/2022   Mitral valve prolapse 08/14/2022   PNA (pneumonia) 08/12/2022   Severe sepsis (HCC) 08/12/2022   HLD (hyperlipidemia) 08/12/2022   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   Trigeminal neuralgia of left side of face 07/15/2019   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   Hypertension 10/31/2013   Pain in joint, shoulder region 04/14/2013   Nodule of right lung 11/07/2011    Orientation RESPIRATION BLADDER Height & Weight     Self, Time, Situation, Place  O2 (Nasal Cannula 2 liters) Incontinent Weight: 158 lb 8.2 oz (71.9 kg) Height:  6\' 2"  (188 cm)  BEHAVIORAL SYMPTOMS/MOOD NEUROLOGICAL BOWEL NUTRITION STATUS      Continent Diet (Please see discharge summary)  AMBULATORY STATUS COMMUNICATION OF NEEDS Skin   Extensive Assist Verbally Other (Comment) (WDL,Wound/Incision LDAs)                       Personal Care Assistance Level of Assistance  Bathing, Feeding, Dressing Bathing Assistance: Maximum assistance Feeding assistance: Limited  assistance Dressing Assistance: Maximum assistance     Functional Limitations Info  Sight, Hearing, Speech Sight Info: Impaired Hearing Info: Adequate Speech Info: Adequate    SPECIAL CARE FACTORS FREQUENCY  PT (By licensed PT), OT (By licensed OT)     PT Frequency: 5x min weekly OT Frequency: 5x min weekly            Contractures Contractures Info: Not present    Additional Factors Info  Code Status, Allergies, Psychotropic Code Status Info: FULL Allergies Info: NKA Psychotropic Info: LORazepam (ATIVAN) tablet 0-4 mg every 6 hours,LORazepam (ATIVAN) tablet 0-4 mg every 12 hours         Current Medications (08/14/2022):  This is the current hospital active medication list Current Facility-Administered Medications  Medication Dose Route Frequency Provider Last Rate Last Admin   0.9 % NaCl with KCl 20 mEq/ L  infusion   Intravenous Continuous Candelaria Stagers T, MD 100 mL/hr at 08/14/22 1740 Infusion Verify at 08/14/22 1740   acetaminophen (TYLENOL) tablet 650 mg  650 mg Oral Q6H PRN Synetta Fail, MD       Or   acetaminophen (TYLENOL) suppository 650 mg  650 mg Rectal Q6H PRN Synetta Fail, MD       azithromycin [facility]) tablet 500 mg  500 mg Oral Daily Candelaria Stagers T, MD   500 mg at 08/14/22 1610   cefTRIAXone (ROCEPHIN) 2 g in sodium chloride 0.9 % 100 mL IVPB  2 g Intravenous Q24H Synetta Fail, MD  Stopped at 08/14/22 1647   chlorpheniramine-HYDROcodone (TUSSIONEX) 10-8 MG/5ML suspension 5 mL  5 mL Oral Q12H PRN Mansy, Jan A, MD   5 mL at 08/12/22 2319   diltiazem (CARDIZEM) tablet 30 mg  30 mg Oral Q6H Gonfa, Taye T, MD   30 mg at 08/14/22 1610   enoxaparin (LOVENOX) injection 40 mg  40 mg Subcutaneous Q24H Synetta Fail, MD   40 mg at 08/14/22 1611   folic acid (FOLVITE) tablet 1 mg  1 mg Oral Daily Candelaria Stagers T, MD   1 mg at 08/14/22 0827   gabapentin (NEURONTIN) capsule 600 mg  600 mg Oral QID Synetta Fail, MD   600 mg at 08/14/22 1356    guaiFENesin (ROBITUSSIN) 100 MG/5ML liquid 5 mL  5 mL Oral Q4H PRN Synetta Fail, MD   5 mL at 08/14/22 0332   LORazepam (ATIVAN) tablet 0-4 mg  0-4 mg Oral Q6H Candelaria Stagers T, MD   3 mg at 08/14/22 1955   Followed by   Melene Muller ON 08/15/2022] LORazepam (ATIVAN) tablet 0-4 mg  0-4 mg Oral Q12H Gonfa, Taye T, MD       LORazepam (ATIVAN) tablet 1-4 mg  1-4 mg Oral Q1H PRN Candelaria Stagers T, MD   2 mg at 08/14/22 1610   melatonin tablet 3 mg  3 mg Oral QHS PRN Synetta Fail, MD   3 mg at 08/12/22 2113   metoprolol tartrate (LOPRESSOR) injection 2.5 mg  2.5 mg Intravenous Q4H PRN Almon Hercules, MD       multivitamin with minerals tablet 1 tablet  1 tablet Oral Daily Candelaria Stagers T, MD   1 tablet at 08/14/22 0828   polyethylene glycol (MIRALAX / GLYCOLAX) packet 17 g  17 g Oral Daily PRN Synetta Fail, MD       sodium chloride flush (NS) 0.9 % injection 3 mL  3 mL Intravenous Q12H Synetta Fail, MD   3 mL at 08/14/22 0847   thiamine (VITAMIN B1) tablet 100 mg  100 mg Oral Daily Candelaria Stagers T, MD       Or   thiamine (VITAMIN B1) injection 100 mg  100 mg Intravenous Daily Candelaria Stagers T, MD   100 mg at 08/14/22 9604     Discharge Medications: Please see discharge summary for a list of discharge medications.  Relevant Imaging Results:  Relevant Lab Results:   Additional Information [id number]  Delilah Shan, LCSWA

## 2022-08-14 NOTE — Progress Notes (Signed)
Pt converted to afib RVR confirmed by EKG.  Rate 140-160.  BP 146/70.  Pt asymptomatic.  Dr. Loney Loh notified.

## 2022-08-14 NOTE — Evaluation (Signed)
Physical Therapy Evaluation Patient Details Name: James Ibarra MRN: [id number] DOB: 1951/02/05 Today's Date: 08/14/2022  History of Present Illness  Pt is a 72 y/o M presenting to ED from home on 6/8 with weakness, admitted for severe sepsis 2/2 multifocal PNA. PMH includes trigeminal neuralgia, facial spasm, HTN and HLD  Clinical Impression  Pt presents with admitting diagnosis above. Eval today limited due to lethargy and pt not following commands. Pt also only oriented to self. Per chart review, pt lives with spouse and was independent at baseline with no AD. Today pt required +2 Max A for bed mobility and +2 Min A for sit to stand. Recommend SNF at DC. PT will continue to follow.       Recommendations for follow up therapy are one component of a multi-disciplinary discharge planning process, led by the attending physician.  Recommendations may be updated based on patient status, additional functional criteria and insurance authorization.  Follow Up Recommendations Can patient physically be transported by private vehicle: No     Assistance Recommended at Discharge Frequent or constant Supervision/Assistance  Patient can return home with the following  Two people to help with walking and/or transfers;A lot of help with bathing/dressing/bathroom;Assistance with cooking/housework;Direct supervision/assist for medications management;Direct supervision/assist for financial management;Assist for transportation;Help with stairs or ramp for entrance    Equipment Recommendations Other (comment) (Per accepting facility)  Recommendations for Other Services       Functional Status Assessment Patient has had a recent decline in their functional status and demonstrates the ability to make significant improvements in function in a reasonable and predictable amount of time.     Precautions / Restrictions Precautions Precautions: Fall Restrictions Weight Bearing Restrictions: No       Mobility  Bed Mobility Overal bed mobility: Needs Assistance Bed Mobility: Sidelying to Sit, Sit to Supine   Sidelying to sit: Max assist, +2 for physical assistance   Sit to supine: Max assist, +2 for physical assistance        Transfers Overall transfer level: Needs assistance Equipment used: 2 person hand held assist Transfers: Sit to/from Stand Sit to Stand: Min assist, +2 physical assistance                Ambulation/Gait                  Stairs            Wheelchair Mobility    Modified Rankin (Stroke Patients Only)       Balance Overall balance assessment: Needs assistance Sitting-balance support: Feet supported Sitting balance-Leahy Scale: Poor Sitting balance - Comments: R lateral lean in sitting   Standing balance support: During functional activity, Reliant on assistive device for balance                                 Pertinent Vitals/Pain Pain Assessment Pain Assessment: No/denies pain    Home Living Family/patient expects to be discharged to:: Private residence Living Arrangements: Spouse/significant other                      Prior Function Prior Level of Function : Independent/Modified Independent             Mobility Comments: amb without AD per chart review       Hand Dominance        Extremity/Trunk Assessment   Upper Extremity Assessment Upper Extremity Assessment:  Generalized weakness    Lower Extremity Assessment Lower Extremity Assessment: Generalized weakness    Cervical / Trunk Assessment Cervical / Trunk Assessment: Kyphotic  Communication   Communication: No difficulties  Cognition Arousal/Alertness: Lethargic   Overall Cognitive Status: Difficult to assess                                 General Comments: pt oriented to self, only follows <10% of commands during session        General Comments General comments (skin integrity, edema, etc.): VSS  on RA    Exercises     Assessment/Plan    PT Assessment Patient needs continued PT services  PT Problem List Decreased strength;Decreased range of motion;Decreased activity tolerance;Decreased balance;Decreased mobility;Decreased coordination;Decreased cognition;Decreased safety awareness;Decreased knowledge of use of DME;Decreased knowledge of precautions;Cardiopulmonary status limiting activity       PT Treatment Interventions DME instruction;Gait training;Stair training;Functional mobility training;Therapeutic activities;Therapeutic exercise;Balance training;Neuromuscular re-education;Patient/family education;Cognitive remediation    PT Goals (Current goals can be found in the Care Plan section)  Acute Rehab PT Goals PT Goal Formulation: Patient unable to participate in goal setting Time For Goal Achievement: 08/28/22 Potential to Achieve Goals: Fair    Frequency Min 1X/week     Co-evaluation PT/OT/SLP Co-Evaluation/Treatment: Yes Reason for Co-Treatment: Complexity of the patient's impairments (multi-system involvement);To address functional/ADL transfers;For patient/therapist safety PT goals addressed during session: Mobility/safety with mobility OT goals addressed during session: ADL's and self-care       AM-PAC PT "6 Clicks" Mobility  Outcome Measure Help needed turning from your back to your side while in a flat bed without using bedrails?: A Lot Help needed moving from lying on your back to sitting on the side of a flat bed without using bedrails?: A Lot Help needed moving to and from a bed to a chair (including a wheelchair)?: A Lot Help needed standing up from a chair using your arms (e.g., wheelchair or bedside chair)?: A Lot Help needed to walk in hospital room?: Total Help needed climbing 3-5 steps with a railing? : Total 6 Click Score: 10    End of Session   Activity Tolerance: Patient limited by lethargy Patient left: in bed;with call bell/phone within  reach;with bed alarm set Nurse Communication: Mobility status PT Visit Diagnosis: Other abnormalities of gait and mobility (R26.89)    Time: 7829-5621 PT Time Calculation (min) (ACUTE ONLY): 17 min   Charges:   PT Evaluation $PT Eval Moderate Complexity: 1 Mod          Kendarius Vigen B, PT, DPT Acute Rehab Services [phone]   Gladys Damme 08/14/2022, 4:22 PM

## 2022-08-14 NOTE — Progress Notes (Signed)
   08/14/22 0434  Assess: MEWS Score  Temp 98.6 F (37 C)  BP (!) 146/70  MAP (mmHg) 92  Pulse Rate (!) 136  ECG Heart Rate (!) 140  Resp 20  Level of Consciousness Alert  SpO2 (!) 79 %  O2 Device Room Air  Assess: MEWS Score  MEWS Temp 0  MEWS Systolic 0  MEWS Pulse 3  MEWS RR 0  MEWS LOC 0  MEWS Score 3  MEWS Score Color Yellow  Assess: if the MEWS score is Yellow or Red  Were vital signs taken at a resting state? Yes  Focused Assessment No change from prior assessment  Does the patient meet 2 or more of the SIRS criteria? Yes  Does the patient have a confirmed or suspected source of infection? Yes  MEWS guidelines implemented  Yes, yellow  Treat  MEWS Interventions Considered administering scheduled or prn medications/treatments as ordered  Take Vital Signs  Increase Vital Sign Frequency  Yellow: Q2hr x1, continue Q4hrs until patient remains green for 12hrs  Escalate  MEWS: Escalate Yellow: Discuss with charge nurse and consider notifying provider and/or RRT  Notify: Charge Nurse/RN  Name of Charge Nurse/RN Notified Heather  Provider Notification  Provider Name/Title Rathore  Date Provider Notified 08/14/22  Method of Notification Page;Call  Notification Reason New onset of dysrhythmia  Provider response En route  Date of Provider Response 08/14/22  Time of Provider Response 0515 (at bedside)  Notify: Rapid Response  Name of Rapid Response RN Notified Mindy,RN  Date Rapid Response Notified 08/14/22  Time Rapid Response Notified 0525  Assess: SIRS CRITERIA  SIRS Temperature  0  SIRS Pulse 1  SIRS Respirations  0  SIRS WBC 1  SIRS Score Sum  2

## 2022-08-15 ENCOUNTER — Inpatient Hospital Stay (HOSPITAL_COMMUNITY): Payer: Medicare Other

## 2022-08-15 DIAGNOSIS — R6520 Severe sepsis without septic shock: Secondary | ICD-10-CM | POA: Diagnosis not present

## 2022-08-15 DIAGNOSIS — A419 Sepsis, unspecified organism: Secondary | ICD-10-CM | POA: Diagnosis not present

## 2022-08-15 LAB — BRAIN NATRIURETIC PEPTIDE
B Natriuretic Peptide: 429.7 pg/mL — ABNORMAL HIGH (ref 0.0–100.0)
B Natriuretic Peptide: 399.7 pg/mL — ABNORMAL HIGH (ref 0.0–100.0)

## 2022-08-15 LAB — BLOOD GAS, VENOUS
Acid-base deficit: 0.2 mmol/L (ref 0.0–2.0)
Bicarbonate: 22.1 mmol/L (ref 20.0–28.0)
Drawn by: 62344
O2 Saturation: 92.1 %
Patient temperature: 37.5
pCO2, Ven: 30 mmHg — ABNORMAL LOW (ref 44–60)
pH, Ven: 7.48 — ABNORMAL HIGH (ref 7.25–7.43)
pO2, Ven: 59 mmHg — ABNORMAL HIGH (ref 32–45)

## 2022-08-15 LAB — RESPIRATORY PANEL BY PCR

## 2022-08-15 LAB — BLOOD GAS, ARTERIAL
Acid-Base Excess: 1 mmol/L (ref 0.0–2.0)
Bicarbonate: 23.6 mmol/L (ref 20.0–28.0)
O2 Saturation: 99.5 %
Patient temperature: 37.7
pCO2 arterial: 32 mmHg (ref 32–48)
pH, Arterial: 7.48 — ABNORMAL HIGH (ref 7.35–7.45)
pO2, Arterial: 216 mmHg — ABNORMAL HIGH (ref 83–108)

## 2022-08-15 LAB — COMPREHENSIVE METABOLIC PANEL
ALT: 45 U/L — ABNORMAL HIGH (ref 0–44)
AST: 72 U/L — ABNORMAL HIGH (ref 15–41)
Albumin: 2.2 g/dL — ABNORMAL LOW (ref 3.5–5.0)
Alkaline Phosphatase: 54 U/L (ref 38–126)
Anion gap: 9 (ref 5–15)
BUN: 10 mg/dL (ref 8–23)
CO2: 21 mmol/L — ABNORMAL LOW (ref 22–32)
Calcium: 7.8 mg/dL — ABNORMAL LOW (ref 8.9–10.3)
Chloride: 104 mmol/L (ref 98–111)
Creatinine, Ser: 0.57 mg/dL — ABNORMAL LOW (ref 0.61–1.24)
GFR, Estimated: >60 mL/min (ref >60)
Glucose, Bld: 97 mg/dL (ref 70–99)
Potassium: 3.9 mmol/L (ref 3.5–5.1)
Sodium: 134 mmol/L — ABNORMAL LOW (ref 135–145)
Total Bilirubin: 0.9 mg/dL (ref 0.3–1.2)
Total Protein: 5.2 g/dL — ABNORMAL LOW (ref 6.5–8.1)

## 2022-08-15 LAB — PROCALCITONIN
Procalcitonin: 0.53 ng/mL
Procalcitonin: 0.42 ng/mL

## 2022-08-15 LAB — CBC
HCT: 33 % — ABNORMAL LOW (ref 39.0–52.0)
Hemoglobin: 11.5 g/dL — ABNORMAL LOW (ref 13.0–17.0)
MCH: 32.2 pg (ref 26.0–34.0)
MCHC: 34.8 g/dL (ref 30.0–36.0)
MCV: 92.4 fL (ref 80.0–100.0)
Platelets: 215 10*3/uL (ref 150–400)
RBC: 3.57 MIL/uL — ABNORMAL LOW (ref 4.22–5.81)
RDW: 13.2 % (ref 11.5–15.5)
WBC: 9.9 10*3/uL (ref 4.0–10.5)
nRBC: 0 % (ref 0.0–0.2)

## 2022-08-15 LAB — MRSA NEXT GEN BY PCR, NASAL: MRSA by PCR Next Gen: NOT DETECTED

## 2022-08-15 LAB — MAGNESIUM: Magnesium: 2.2 mg/dL (ref 1.7–2.4)

## 2022-08-15 LAB — LACTIC ACID, PLASMA: Lactic Acid, Venous: 0.9 mmol/L (ref 0.5–1.9)

## 2022-08-15 LAB — STREP PNEUMONIAE URINARY ANTIGEN: Strep Pneumo Urinary Antigen: NEGATIVE

## 2022-08-15 LAB — PHOSPHORUS: Phosphorus: 2.9 mg/dL (ref 2.5–4.6)

## 2022-08-15 MED ORDER — HYDRALAZINE HCL 20 MG/ML IJ SOLN: 10.0000 mg | Freq: Four times a day (QID) | INTRAMUSCULAR | Status: DC | PRN

## 2022-08-15 MED ORDER — SODIUM CHLORIDE 0.9 % IV SOLN
2.0000 g | Freq: Once | INTRAVENOUS | Status: AC
  Administered 2022-08-15: 2 g via INTRAVENOUS
  Filled 2022-08-15: qty 12.5

## 2022-08-15 MED ORDER — IPRATROPIUM-ALBUTEROL 0.5-2.5 (3) MG/3ML IN SOLN
RESPIRATORY_TRACT | Status: AC
  Filled 2022-08-15: qty 3

## 2022-08-15 MED ORDER — LORAZEPAM 1 MG PO TABS
1.0000 mg | ORAL_TABLET | ORAL | Status: DC | PRN
  Filled 2022-08-15: qty 1

## 2022-08-15 MED ORDER — VANCOMYCIN HCL IN DEXTROSE 1-5 GM/200ML-% IV SOLN: 1000.0000 mg | Freq: Two times a day (BID) | INTRAVENOUS | Status: DC

## 2022-08-15 MED ORDER — CHLORHEXIDINE GLUCONATE CLOTH 2 % EX PADS
6.0000 | MEDICATED_PAD | Freq: Every day | CUTANEOUS | Status: DC
  Administered 2022-08-15 – 2022-08-19 (×5): 6 via TOPICAL

## 2022-08-15 MED ORDER — IPRATROPIUM-ALBUTEROL 0.5-2.5 (3) MG/3ML IN SOLN
3.0000 mL | Freq: Four times a day (QID) | RESPIRATORY_TRACT | Status: DC
  Administered 2022-08-15 (×3): 3 mL via RESPIRATORY_TRACT
  Filled 2022-08-15 (×3): qty 3

## 2022-08-15 MED ORDER — DILTIAZEM HCL 25 MG/5ML IV SOLN
10.0000 mg | Freq: Once | INTRAVENOUS | Status: AC
  Administered 2022-08-15: 10 mg via INTRAVENOUS
  Filled 2022-08-15: qty 5

## 2022-08-15 MED ORDER — IPRATROPIUM-ALBUTEROL 0.5-2.5 (3) MG/3ML IN SOLN
3.0000 mL | Freq: Two times a day (BID) | RESPIRATORY_TRACT | Status: DC
  Administered 2022-08-16 – 2022-08-18 (×6): 3 mL via RESPIRATORY_TRACT
  Filled 2022-08-15 (×7): qty 3

## 2022-08-15 MED ORDER — VANCOMYCIN HCL 1500 MG/300ML IV SOLN
1500.0000 mg | Freq: Once | INTRAVENOUS | Status: AC
  Administered 2022-08-15: 1500 mg via INTRAVENOUS
  Filled 2022-08-15: qty 300

## 2022-08-15 MED ORDER — SODIUM CHLORIDE 0.9 % IV SOLN
2.0000 g | Freq: Three times a day (TID) | INTRAVENOUS | Status: AC
  Administered 2022-08-15 – 2022-08-17 (×8): 2 g via INTRAVENOUS
  Filled 2022-08-15 (×8): qty 12.5

## 2022-08-15 MED ORDER — LORAZEPAM 2 MG/ML IJ SOLN
1.0000 mg | INTRAMUSCULAR | Status: DC | PRN
  Administered 2022-08-15 – 2022-08-16 (×3): 2 mg via INTRAVENOUS
  Administered 2022-08-16: 3 mg via INTRAVENOUS
  Administered 2022-08-16: 1 mg via INTRAVENOUS
  Administered 2022-08-16: 2 mg via INTRAVENOUS
  Administered 2022-08-16: 1 mg via INTRAVENOUS
  Filled 2022-08-15 (×5): qty 1
  Filled 2022-08-15: qty 2
  Filled 2022-08-15: qty 1
  Filled 2022-08-15: qty 2

## 2022-08-15 MED ORDER — CHLORHEXIDINE GLUCONATE CLOTH 2 % EX PADS
6.0000 | MEDICATED_PAD | Freq: Every day | CUTANEOUS | Status: DC
  Administered 2022-08-16 – 2022-08-21 (×5): 6 via TOPICAL

## 2022-08-15 MED ORDER — ORAL CARE MOUTH RINSE: 15.0000 mL | OROMUCOSAL | Status: DC | PRN

## 2022-08-15 MED ORDER — DILTIAZEM HCL 30 MG PO TABS
30.0000 mg | ORAL_TABLET | Freq: Once | ORAL | Status: DC
  Filled 2022-08-15: qty 1

## 2022-08-15 NOTE — Progress Notes (Signed)
Pt requiring 6 L Oxygen to keep sat> 90% and intermittently desaturating to 78 while asleep.  Tachypnea also noted 26/min.  Lungs with rhonchi.  Pt also back in atrial fib with rates up to 120 noted.  Dr. Loney Loh notified with new orders.

## 2022-08-15 NOTE — Progress Notes (Signed)
Upon arrival to [facility] 12 RT placed pt on HHFNC at 30L 40%. Pt oxygen saturation at 95% with no patient distress.

## 2022-08-15 NOTE — Progress Notes (Signed)
Notified patient's wife of transfer to [facility].

## 2022-08-15 NOTE — Progress Notes (Addendum)
Pharmacy Antibiotic Note  James Ibarra is a 72 y.o. male admitted on 08/12/2022 with pneumonia, now desatting into 71s with worsening opacities on CXR.  Pharmacy has been consulted for vancomycin dosing to broaden coverage; pt currently on ceftriaxone and azithromycin for CAP.  Plan: Vancomycin 1500mg  x1 then 1000mg  IV Q 12 hrs. Goal AUC 400-550.  Expected AUC 450.  SCr used 0.8.   Height: 6\' 2"  (188 cm) Weight: 71.9 kg (158 lb 8.2 oz) IBW/kg (Calculated) : 82.2  Temp (24hrs), Avg:98.8 F (37.1 C), Min:97.1 F (36.2 C), Max:100.1 F (37.8 C)  Recent Labs  Lab 08/12/22 1445 08/12/22 1815 08/13/22 0251 08/14/22 0339  WBC 15.0*  --  11.6* 13.8*  CREATININE 1.12  --  0.60* 0.72  LATICACIDVEN 4.6* 1.6  --   --     Estimated Creatinine Clearance: 86.1 mL/min (by C-G formula based on SCr of 0.72 mg/dL).    No Known Allergies   Thank you for allowing pharmacy to be a part of this patient's care.  Vernard Gambles, PharmD, BCPS  08/15/2022 2:11 AM   Addendum:  Pt now tx'd to ICU and to broaden coverage further by replacing ceftriaxone with cefepime. Start cefepime 2g IV Q8H.  VB 4:03 AM

## 2022-08-15 NOTE — Consult Note (Signed)
NAME:  James Ibarra, MRN:  [id number], DOB:  May 15, 1950, LOS: 2 ADMISSION DATE:  08/12/2022, CONSULTATION DATE:  08/15/22 REFERRING MD:  Loney Loh, CHIEF COMPLAINT:  worsening respiratory status   History of Present Illness:  James Ibarra is a 72 y.o. M with PMH significant for Mitral regurgitation and prolapse, ETOH abuse, trigeminal neuralgia, HTN, HL who presented to the ED 6/8 with fever, cough and shortness of breath for three days.  In the ED, he was febrile and tachycardic and CXR showed R sided pneumonia. He was admitted to [facility] and treated with ceftriaxone and azithromycin.  During his hospital course he developed new onset atrial fibrillation and transiently required a cardizem gtt along with confusion.  Pt's wife reported liquor consumption daily.    On 6/11 pt's oxygen requirement increased rapidly from 5L [place] to 55 L HFNC, he was initially agitated and received Ativan 3mg  and Tussionex, CXR with worsening R-sided infiltrate and he became more somnolent.  PCCM consulted in the this context    Pertinent  Medical History   has a past medical history of History of nonmelanoma skin cancer, Hypercholesteremia, Hypertension, and Trigeminal neuralgia.   Significant Hospital Events: Including procedures, antibiotic start and stop dates in addition to other pertinent events   6/8 admit to [facility] with PNA 6/11 worsening R-sided infiltrate, hypoxia and mental status, transfer to ICU on HFNC  Interim History / Subjective:  Pt arousable but somnolent, ABG  7.48/32/216/23  Objective   Blood pressure 99/68, pulse (!) 106, temperature 99.9 F (37.7 C), temperature source Oral, resp. rate (!) 44, height 6\' 2"  (1.88 m), weight 71.9 kg, SpO2 98 %.    FiO2 (%):  [85 %-100 %] 85 %   Intake/Output Summary (Last 24 hours) at 08/15/2022 0300 Last data filed at 08/14/2022 1740 Gross per 24 hour  Intake 589.62 ml  Output 1825 ml  Net -1235.38 ml   Filed Weights   08/12/22 1809  Weight: 71.9 kg    General:  elderly M, sleeping in bed somnolent HEENT: MM pink/moist Neuro: arousable to voice and sternal rub, falls back to sleep without answering questions CV: s1s2 rrr, no m/r/g PULM:  decreased air entry in the bilateral bases, no rhonchi or wheezing, no tachypnea  GI: soft, non-distended  Extremities: warm/dry, no edema  Skin: no rashes or lesions   Resolved Hospital Problem list     Assessment & Plan:    Acute Hypoxic Respiratory Failure secondary to R-sided pneumonia Increasing FiO2 requirement -transfer to ICU, at risk for intubation, though can titrate down O2 requirement based on ABG -flu and covid negative -check urine strep, RVP and sputum culture -follow blood cultures -continue current azithromycin, cefepime and vanc -repeat ABG in the AM -IS and flutter valve as patient's mental status will allow   Acute Metabolic Encephalopathy ETOH withdrawal -somnolent currently, will hold CIWA ativan, re-evaluate need for benzos as patient wakes up -continue thiamine and folic acid    Paroxysmal Atrial Fibrillation HTN 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 HL, HTN -converted to sinus rhythm -seen by cardiology and will need outpatient follow up for mitral valve disease -continue statin, hold anti-hypertensives as BP is soft   Best Practice (right click and "Reselect all SmartList Selections" daily)   Diet/type: NPO DVT prophylaxis: SCD GI prophylaxis: N/A Lines: N/A Foley:  N/A Code Status:  full code Last date of multidisciplinary goals of care discussion [pending, will try to reach wife to update regarding transfer]  Labs   CBC: Recent Labs  Lab  08/12/22 1445 08/13/22 0251 08/14/22 0339  WBC 15.0* 11.6* 13.8*  NEUTROABS 14.3*  --   --   HGB 13.5 11.1* 12.0*  HCT 40.6 33.0* 35.6*  MCV 96.0 92.7 93.9  PLT 214 190 197    Basic Metabolic Panel: Recent Labs  Lab 08/12/22 1445 08/13/22 0250 08/13/22 0251 08/14/22 0339  NA 132*  --  137 132*  K 3.5  --  3.3* 3.5   CL 98  --  101 96*  CO2 19*  --  27 24  GLUCOSE 185*  --  122* 101*  BUN 18  --  13 9  CREATININE 1.12  --  0.60* 0.72  CALCIUM 8.4*  --  8.4* 8.3*  MG  --  1.9  --  1.8  PHOS  --   --   --  2.0*   GFR: Estimated Creatinine Clearance: 86.1 mL/min (by C-G formula based on SCr of 0.72 mg/dL). Recent Labs  Lab 08/12/22 1445 08/12/22 1815 08/13/22 0251 08/14/22 0339  PROCALCITON  --  1.16 1.41 0.95  WBC 15.0*  --  11.6* 13.8*  LATICACIDVEN 4.6* 1.6  --   --     Liver Function Tests: Recent Labs  Lab 08/12/22 1445 08/13/22 0251 08/14/22 0339  AST 40 60* 61*  ALT 14 23 31   ALKPHOS 54 52 76  BILITOT 1.2 0.8 1.3*  PROT 6.2* 5.0* 5.9*  ALBUMIN 2.9* 2.3* 2.5*   No results for input(s): "LIPASE", "AMYLASE" in the last 168 hours. No results for input(s): "AMMONIA" in the last 168 hours.  ABG    Component Value Date/Time   PHART 7.48 (H) 08/15/2022 0212   PCO2ART 32 08/15/2022 0212   PO2ART 216 (H) 08/15/2022 0212   HCO3 23.6 08/15/2022 0212   O2SAT 99.5 08/15/2022 0212     Coagulation Profile: Recent Labs  Lab 08/13/22 0251  INR 1.3*    Cardiac Enzymes: Recent Labs  Lab 08/14/22 0339  CKTOTAL 928*    HbA1C: No results found for: "HGBA1C"  CBG: No results for input(s): "GLUCAP" in the last 168 hours.  Review of Systems:   Unable to obtain secondary to mental status  Past Medical History:  He,  has a past medical history of History of nonmelanoma skin cancer, Hypercholesteremia, Hypertension, and Trigeminal neuralgia.   Surgical History:   Past Surgical History:  Procedure Laterality Date   HERNIA REPAIR  11/07/11   [facility]   INGUINAL HERNIA REPAIR  11/07/2011   Procedure: LAPAROSCOPIC INGUINAL HERNIA;  Surgeon: Atilano Ina, MD,FACS;  Location: [facility] ORS;  Service: General;  Laterality: Left;   RETINAL DETACHMENT SURGERY  1997   SKIN CANCER EXCISION  2012, 1999   basal cell - neck and hand     Social History:   reports that he has never smoked. He  has never used smokeless tobacco. He reports current alcohol use. He reports that he does not use drugs.   Family History:  His family history includes Heart disease in his father and mother.   Allergies No Known Allergies   Home Medications  Prior to Admission medications   Medication Sig Start Date End Date Taking? Authorizing Provider  albuterol (VENTOLIN HFA) 108 (90 Base) MCG/ACT inhaler Inhale 1 puff into the lungs every 6 (six) hours as needed for wheezing or shortness of breath. 08/11/22  Yes [provider]  amLODipine (NORVASC) 5 MG tablet Take 5 mg by mouth daily before breakfast.    Yes [provider]  azithromycin (ZITHROMAX) 250 MG tablet Take 250 mg by mouth 2 (two) times daily. 08/11/22  Yes [provider]  gabapentin (NEURONTIN) 300 MG capsule Take 2 capsules by mouth four times a day. 12/05/21  Yes Levert Feinstein, MD  HYDROcodone bit-homatropine (HYCODAN) 5-1.5 MG/5ML syrup Take 5 mLs by mouth every 4 (four) hours as needed for cough.   Yes [provider]  ketoconazole (NIZORAL) 2 % cream Apply 1 Application topically as needed for irritation. 01/16/13  Yes [provider]  melatonin 3 MG TABS tablet Take 3 mg by mouth at bedtime as needed.   Yes [provider]  methylPREDNISolone (MEDROL DOSEPAK) 4 MG TBPK tablet Take by mouth. 6 day taper 08/11/22  Yes [provider]  Multiple Vitamins-Minerals (ICAPS AREDS 2 PO) Take 1 capsule by mouth in the morning and at bedtime.   Yes [provider]  olmesartan-hydrochlorothiazide (BENICAR HCT) 40-25 MG per tablet Take 1 tablet by mouth daily before breakfast.    Yes [provider]  potassium chloride SA (KLOR-CON M) 20 MEQ tablet Take 20 mEq by mouth 2 (two) times daily.   Yes [provider]  pravastatin (PRAVACHOL) 10 MG tablet Take 10 mg by mouth at bedtime.   Yes [provider]  sildenafil (REVATIO) 20 MG tablet Take 2-5 tables for ED  orally once a day if needed   Yes [provider]  traZODone (DESYREL) 100 MG tablet Take 50-100 mg by mouth at bedtime as needed.    Yes [provider]  botulinum toxin Type A (BOTOX) 100 units SOLR injection Inject 100 Units into the muscle every 3 (three) months. Patient not taking: Reported on 08/12/2022 05/24/21   Levert Feinstein, MD     Critical care time:  32 minutes    CRITICAL CARE Performed by: Darcella Gasman Tiffany Calmes   Total critical care time: 32 minutes  Critical care time was exclusive of separately billable procedures and treating other patients.  Critical care was necessary to treat or prevent imminent or life-threatening deterioration.  Critical care was time spent personally by me on the following activities: development of treatment plan with patient and/or surrogate as well as nursing, discussions with consultants, evaluation of patient's response to treatment, examination of patient, obtaining history from patient or surrogate, ordering and performing treatments and interventions, ordering and review of laboratory studies, ordering and review of radiographic studies, pulse oximetry and re-evaluation of patient's condition.  Darcella Gasman Temperence Zenor, PA-C [facility] See Amion for pager If no response to pager , please call [phone] until 7pm After 7:00 pm call Elink  [phone]

## 2022-08-15 NOTE — Progress Notes (Signed)
Report given and patient transported to [facility] with belongings. Unable to reach patient's wife.   Left message with wife, [facility]'s voicemail to call nursing back for update.

## 2022-08-15 NOTE — Progress Notes (Signed)
PCCM Interval Progress Note:  Assessed patient on PM rounds. Patient initially sleeping on my arrival, laying curled up on R side.  BP 128/64   Pulse 86   Temp 98.8 F (37.1 C)   Resp (!) 38   Ht 6\' 2"  (1.88 m)   Wt 71.9 kg   SpO2 96%   BMI 20.35 kg/m   Physical Examination: General: Acutely ill-appearing elderly man in NAD. HEENT: [place]/AT, anicteric sclera, PERRL, moist mucous membranes. Neuro: Awake, oriented x 3. Responds to verbal stimuli. Following commands consistently. Moves all 4 extremities spontaneously.  CV: RRR, no m/g/r. PULM: Breathing even and unlabored on 4L [place]. Lung fields rhonchorous throughout on R, scattered rhonchi on L. GI: Soft, nontender, nondistended. Extremities: No LE edema noted. Skin: Warm/dry, no rashes.  CXR from AM reviewed with interval worsening of patchy airspace consolidative opacities in R lung, likely multilobar PNA, less likely asymmetric edema, increased R pleural effusion.  Lab Review: WBC 9.9 Hgb 11.5 Plt 215  RVP +rhinovirus S.pneumo negative PCT 0.42  LA 0.9 VBG 7.48/30/59/22.1  BNP 429.7 > 399.7  DuoNebs Q6H added earlier in the day, CIWA protocol added back as RN noted patient to be tremulous. Continues on Cefepime. Chest PT added in addition to IS/flutter.  Wife, Holli, updated at bedside.  Tim Lair, PA-C [facility] 08/15/22 5:20 PM  Please see [email or web address] for pager details.  From 7A-7P if no response, please call [phone] After hours, please call ELink [phone]

## 2022-08-15 NOTE — Progress Notes (Signed)
Spoke to patient's wife and updated her regarding pt's status and ICU transfer.   Darcella Gasman Khair Chasteen, PA-C

## 2022-08-15 NOTE — Progress Notes (Signed)
eLink Physician-Brief Progress Note Patient Name: James Ibarra DOB: 05/12/1950 MRN: [id number]   Date of Service  08/15/2022  HPI/Events of Note  72 year old with a history of severe MR with mitral valve prolapse, alcohol use disorder currently on CIWA protocol and atrial fibrillation with rapid ventricular response on Cardizem that is transferred from the floor for worsening hypoxemia, worsening right-sided infiltrate and ongoing agitation.  On presentation, he is normotensive, tachycardic and tachypneic with hypoxemia requiring heated high flow to maintain saturations in the 90s.  ABG shows adequate oxygenation and some hyperventilation.  Metabolic panel with mild transaminitis CBC with normocytic anemia with normal lactic acid and equivocal procalcitonin.   Chest radiograph compared to 2 days prior shows worsening right-sided GGO  eICU Interventions  Currently receiving treatment with vancomycin, ceftriaxone, azithromycin-unclear whether this is truly an infection versus edema, continue empiric antibiotics for now  Procalcitonin, add on BNP to morning lab  In the setting of MVP and valvular insufficiency, tachycardia avoidance is important.  Maintain Cardizem for now.  Metoprolol IV pushes moderate effect.  Plan for additional 30 mg dose now given persistent rates 120-140  CIWA protocol in place of Ativan  Enoxaparin for DVT prophylaxis  GI prophylaxis not indicated     Intervention Category Evaluation Type: New Patient Evaluation  [name] 08/15/2022, 3:46 AM

## 2022-08-15 NOTE — Progress Notes (Addendum)
Overnight progress note  Patient was intermittently desatting to the high 70s when asleep and currently requiring 6 L [place] to maintain oxygen saturation above 90%.  Tachypneic with respiratory rate in the mid 20s.  He has converted back to A-fib with rate currently 100-110s.  He is on p.o. Cardizem 30 mg every 6 hours.  Blood pressure stable.  Chest x-ray showing interval worsening of interstitial and patchy airspace consolidative opacities in the right lung fields, most likely multilobar pneumonia and less likely asymmetric edema.  Also showing increased small right pleural effusion.  Echo done 06/30/2022 showing EF 60 to 65%, severe prolapse of posterior MV leaflet with severe mitral valve regurgitation.  Hold IV fluids and check BNP.  Patient is currently on ceftriaxone and azithromycin, will add on vancomycin and check MRSA PCR screen.  WBC 13.8 and procalcitonin 0.95 yesterday, continue to trend labs.  Also check lactate.  COVID/influenza/RSV PCR were negative on admission.  Addendum/update 08/15/2022 at 2:26 AM: Patient desatted to mid 80s on 6 L [place] and now requiring 55 L HFNC, 100% FiO2 to maintain sats in the 90s.  Seen and examined at bedside.  Ill-appearing, diaphoretic, and tachypneic.  Somnolent but arousable.  Rhonchi appreciated on exam.  Oral temperature 99.9 F.  He has converted to sinus rhythm.  Stat ABG ordered and critical care consulted.

## 2022-08-16 DIAGNOSIS — R6520 Severe sepsis without septic shock: Secondary | ICD-10-CM | POA: Diagnosis not present

## 2022-08-16 DIAGNOSIS — A419 Sepsis, unspecified organism: Secondary | ICD-10-CM | POA: Diagnosis not present

## 2022-08-16 LAB — BASIC METABOLIC PANEL
Anion gap: 12 (ref 5–15)
BUN: 14 mg/dL (ref 8–23)
CO2: 20 mmol/L — ABNORMAL LOW (ref 22–32)
Calcium: 8 mg/dL — ABNORMAL LOW (ref 8.9–10.3)
Chloride: 105 mmol/L (ref 98–111)
Creatinine, Ser: 0.72 mg/dL (ref 0.61–1.24)
GFR, Estimated: >60 mL/min (ref >60)
Glucose, Bld: 114 mg/dL — ABNORMAL HIGH (ref 70–99)
Potassium: 3.6 mmol/L (ref 3.5–5.1)
Sodium: 137 mmol/L (ref 135–145)

## 2022-08-16 LAB — CBC WITH DIFFERENTIAL/PLATELET
Abs Immature Granulocytes: 0.09 10*3/uL — ABNORMAL HIGH (ref 0.00–0.07)
Basophils Absolute: 0 10*3/uL (ref 0.0–0.1)
Basophils Relative: 0 %
Eosinophils Absolute: 0.1 10*3/uL (ref 0.0–0.5)
Eosinophils Relative: 1 %
HCT: 35.9 % — ABNORMAL LOW (ref 39.0–52.0)
Hemoglobin: 12.2 g/dL — ABNORMAL LOW (ref 13.0–17.0)
Immature Granulocytes: 1 %
Lymphocytes Relative: 8 %
Lymphs Abs: 0.8 10*3/uL (ref 0.7–4.0)
MCH: 30.7 pg (ref 26.0–34.0)
MCHC: 34 g/dL (ref 30.0–36.0)
MCV: 90.2 fL (ref 80.0–100.0)
Monocytes Absolute: 1.5 10*3/uL — ABNORMAL HIGH (ref 0.1–1.0)
Monocytes Relative: 14 %
Neutro Abs: 8.1 10*3/uL — ABNORMAL HIGH (ref 1.7–7.7)
Neutrophils Relative %: 76 %
Platelets: 254 10*3/uL (ref 150–400)
RBC: 3.98 MIL/uL — ABNORMAL LOW (ref 4.22–5.81)
RDW: 13.1 % (ref 11.5–15.5)
WBC: 10.6 10*3/uL — ABNORMAL HIGH (ref 4.0–10.5)
nRBC: 0 % (ref 0.0–0.2)

## 2022-08-16 LAB — MAGNESIUM: Magnesium: 2.2 mg/dL (ref 1.7–2.4)

## 2022-08-16 MED ORDER — METOPROLOL TARTRATE 5 MG/5ML IV SOLN: 5.0000 mg | Freq: Four times a day (QID) | INTRAVENOUS | Status: DC

## 2022-08-16 MED ORDER — AMIODARONE IV BOLUS ONLY 150 MG/100ML
INTRAVENOUS | Status: AC
  Administered 2022-08-16: 150 mg via INTRAVENOUS
  Filled 2022-08-16: qty 100

## 2022-08-16 MED ORDER — POTASSIUM CHLORIDE 10 MEQ/100ML IV SOLN
10.0000 meq | INTRAVENOUS | Status: AC
  Administered 2022-08-16 (×3): 10 meq via INTRAVENOUS
  Filled 2022-08-16: qty 100

## 2022-08-16 MED ORDER — LORAZEPAM 2 MG/ML IJ SOLN
1.0000 mg | INTRAMUSCULAR | Status: DC | PRN
  Administered 2022-08-16: 1 mg via INTRAVENOUS
  Filled 2022-08-16: qty 1

## 2022-08-16 MED ORDER — PHENOBARBITAL SODIUM 65 MG/ML IJ SOLN: 32.5000 mg | Freq: Three times a day (TID) | INTRAMUSCULAR | Status: DC

## 2022-08-16 MED ORDER — DILTIAZEM HCL-DEXTROSE 125-5 MG/125ML-% IV SOLN (PREMIX)
INTRAVENOUS | Status: AC
  Administered 2022-08-16: 10 mg via INTRAVENOUS
  Filled 2022-08-16: qty 125

## 2022-08-16 MED ORDER — POTASSIUM CHLORIDE CRYS ER 20 MEQ PO TBCR: 40.0000 meq | EXTENDED_RELEASE_TABLET | Freq: Once | ORAL | Status: DC

## 2022-08-16 MED ORDER — METOPROLOL TARTRATE 5 MG/5ML IV SOLN
2.5000 mg | Freq: Four times a day (QID) | INTRAVENOUS | Status: DC
  Administered 2022-08-16 – 2022-08-17 (×4): 2.5 mg via INTRAVENOUS
  Filled 2022-08-16 (×4): qty 5

## 2022-08-16 MED ORDER — AMIODARONE IV BOLUS ONLY 150 MG/100ML: 150.0000 mg | Freq: Once | INTRAVENOUS | Status: AC

## 2022-08-16 MED ORDER — PHENOBARBITAL SODIUM 65 MG/ML IJ SOLN
65.0000 mg | Freq: Three times a day (TID) | INTRAMUSCULAR | Status: DC
  Administered 2022-08-16: 65 mg via INTRAVENOUS
  Filled 2022-08-16: qty 1

## 2022-08-16 MED ORDER — LORAZEPAM 2 MG/ML IJ SOLN
1.0000 mg | INTRAMUSCULAR | Status: DC | PRN
  Administered 2022-08-16 – 2022-08-20 (×10): 1 mg via INTRAVENOUS
  Filled 2022-08-16 (×12): qty 1

## 2022-08-16 MED ORDER — PHENOBARBITAL SODIUM 130 MG/ML IJ SOLN
97.5000 mg | Freq: Three times a day (TID) | INTRAMUSCULAR | Status: DC
  Administered 2022-08-16 – 2022-08-17 (×4): 97.5 mg via INTRAVENOUS
  Filled 2022-08-16 (×5): qty 1

## 2022-08-16 MED ORDER — DILTIAZEM HCL-DEXTROSE 125-5 MG/125ML-% IV SOLN (PREMIX)
5.0000 mg/h | INTRAVENOUS | Status: DC
  Administered 2022-08-16 – 2022-08-18 (×4): 5 mg/h via INTRAVENOUS
  Filled 2022-08-16 (×4): qty 125

## 2022-08-16 MED ORDER — PHENOBARBITAL SODIUM 65 MG/ML IJ SOLN: 65.0000 mg | Freq: Once | INTRAMUSCULAR | Status: DC

## 2022-08-16 MED ORDER — PHENOBARBITAL SODIUM 130 MG/ML IJ SOLN
97.5000 mg | Freq: Once | INTRAMUSCULAR | Status: AC
  Administered 2022-08-16: 97.5 mg via INTRAVENOUS

## 2022-08-16 MED ORDER — DILTIAZEM LOAD VIA INFUSION
10.0000 mg | Freq: Once | INTRAVENOUS | Status: AC
  Filled 2022-08-16: qty 10

## 2022-08-16 MED ORDER — PHENOBARBITAL SODIUM 65 MG/ML IJ SOLN
65.0000 mg | Freq: Once | INTRAMUSCULAR | Status: AC
  Administered 2022-08-16: 65 mg via INTRAVENOUS
  Filled 2022-08-16: qty 1

## 2022-08-16 MED ORDER — PHENOBARBITAL SODIUM 65 MG/ML IJ SOLN
65.0000 mg | Freq: Three times a day (TID) | INTRAMUSCULAR | Status: DC
  Administered 2022-08-18 (×2): 65 mg via INTRAVENOUS
  Filled 2022-08-16 (×2): qty 1

## 2022-08-16 NOTE — Progress Notes (Addendum)
eLink Physician-Brief Progress Note Patient Name: James Ibarra DOB: [birth year] MRN: [id number]   Date of Service  08/16/2022  HPI/Events of Note  72 year old gentleman, past medical history of mitral regurgitation, prolapse, alcohol abuse hypertension developed right-sided pneumonia and agitation with alcohol withdrawal.  Has been n.p.o. for several days.  eICU Interventions  Renew behavioral orders  Add CBGs to monitor for hypoglycemia   0422 - potassium 3.2, cret 0.66, GFR >60; KCl IV ordered.  Intervention Category Minor Interventions: Clinical assessment - ordering diagnostic tests  Lee Kuang 08/16/2022, 8:49 PM

## 2022-08-16 NOTE — Progress Notes (Signed)
NAME:  James Ibarra, MRN:  [id number], DOB:  April 01, 1950, LOS: 3 ADMISSION DATE:  08/12/2022, CONSULTATION DATE:  08/16/22 REFERRING MD:  Loney Loh, CHIEF COMPLAINT:  worsening respiratory status   History of Present Illness:  James Ibarra is a 72 y.o. M with PMH significant for Mitral regurgitation and prolapse, ETOH abuse, trigeminal neuralgia, HTN, HL who presented to the ED 6/8 with fever, cough and shortness of breath for three days.  In the ED, he was febrile and tachycardic and CXR showed R sided pneumonia. He was admitted to [facility] and treated with ceftriaxone and azithromycin.  During his hospital course he developed new onset atrial fibrillation and transiently required a cardizem gtt along with confusion.  Pt's wife reported liquor consumption daily.    On 6/11 pt's oxygen requirement increased rapidly from 5L [place] to 55 L HFNC, he was initially agitated and received Ativan 3mg  and Tussionex, CXR with worsening R-sided infiltrate and he became more somnolent.  PCCM consulted in the this context   Pertinent  Medical History   has a past medical history of History of nonmelanoma skin cancer, Hypercholesteremia, Hypertension, and Trigeminal neuralgia.  Significant Hospital Events: Including procedures, antibiotic start and stop dates in addition to other pertinent events   6/8 admit to [facility] with PNA 6/11 worsening R-sided infiltrate, hypoxia and mental status, transfer to ICU on HFNC  Interim History / Subjective:  Required frequent ativan overnight and this morning, CIWA 13-15 Remains NPO  Placed back on IV cardizem given ongoing afib with RVR despite amio bolus overnight.  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  She was unaware of his ETOH use and amount, as he hid it, but drinks wine 2-4 glasses a day with  liquor, drank half a gallon of rum she know of in less than 4 days.    Objective   Blood pressure (!) 134/119, pulse 95, temperature 97.9 F (36.6 C), temperature source Oral, resp. rate (!) 48, height 6\' 2"  (1.88 m), weight 66.5 kg, SpO2 97 %.        Intake/Output Summary (Last 24 hours) at 08/16/2022 1124 Last data filed at 08/16/2022 0600 Gross per 24 hour  Intake 538.55 ml  Output 1225 ml  Net -686.45 ml   Filed Weights   08/12/22 1809 08/16/22 0500  Weight: 71.9 kg 66.5 kg   General:  older male lying in bed, intermittently restless HEENT: MM pink/moist, pupils 3/r, anicteric, few twitches to left eye (not new per wife) Neuro: Awake, able to say his name and year, some mumbled speech, follows simple commands, MAE CV: afib, rates remain 115s, +murmur PULM:  non labored, intermittently tachypneic/ shallow breaths on [place] 3L, few scattered rhonchi on R, clear left, diminished base, congested np cough GI: soft, bs+, NT, ND, purwick - amber urine Extremities: warm/dry, no LE edema  Skin: no rashes  afebrile UOP 1.2L/ 24hrs Net +2.3 Labs reviewed> WBC 10.6, K 3.6, sCr 0.72, Mag 2.2, BNP 399 (yesterday), PCT down trending as of yesterday 0.53> 0.42   Resolved Hospital Problem list     Assessment & Plan:   Acute Hypoxic Respiratory Failure secondary to R-sided pneumonia, +Rhinovirus, viral and probable bacterial component - urine strep ag negative.   - can not rule out aspiration component  or asymmetric edema from severe  MR, currently looks euvolemic to possibly drier side.  Monitor volume status closely.  Remains NPO due to aspiration risk/ confusion.  If ongoing, may need cortrak placement - place back on HHFNC given tachypnea to prevent respiratory fatigue - keep supplemental O2 > 92% - follow sputum and blood cx - monitor clinically>  fever curve/ trend CBC - PCT decreasing> cont cefepime/ azithro for now.  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 HL, HTN - CHADS2-VASc score 2> hold on anticoagulation for now, high risk for AE w/ETOH abuse - back in afib with RVR overnight> worse with agitation - cont cardizem gtt, hemodynamically tolerating for now - add lopressor 2.5mg  IV q 6hr while NPO - maximize electrolytes> K > 4, Mag > 2, will supplement with KCL today - seen by cardiology per [facility] (although do not see consult note) and will need outpatient follow up for mitral valve disease - goal euvolemia  - cont to hold pre-admit antihypertensives  - monitor renal function closely  At risk for malnutrition - remains NPO given mental status - may need to consider cortrak if unable to pass swallow eval soon  Mild Transaminitis  - trend LFTs  Best Practice (right click and "Reselect all SmartList Selections" daily)   Diet/type: NPO DVT prophylaxis: SCD GI prophylaxis: N/A Lines: N/A Foley:  N/A Code Status:  full code Last date of multidisciplinary goals of care discussion [pending, will try to reach wife to update regarding transfer]  Wife updated at bedside 6/12 am.   Labs   CBC: Recent Labs  Lab 08/12/22 1445 08/13/22 0251 08/14/22 0339 08/15/22 0232 08/16/22 0412  WBC 15.0* 11.6* 13.8* 9.9 10.6*  NEUTROABS 14.3*  --   --   --  8.1*  HGB 13.5 11.1* 12.0* 11.5* 12.2*  HCT 40.6 33.0* 35.6* 33.0* 35.9*  MCV 96.0 92.7 93.9 92.4 90.2  PLT 214 190 197 215 254    Basic Metabolic Panel: Recent Labs  Lab 08/12/22 1445 08/13/22 0250 08/13/22 0251  08/14/22 0339 08/15/22 0232 08/16/22 0412  NA 132*  --  137 132* 134* 137  K 3.5  --  3.3* 3.5 3.9 3.6  CL 98  --  101 96* 104 105  CO2 19*  --  27 24 21* 20*  GLUCOSE 185*  --  122* 101* 97 114*  BUN 18  --  13 9 10 14   CREATININE 1.12  --  0.60* 0.72 0.57* 0.72  CALCIUM 8.4*  --  8.4* 8.3* 7.8* 8.0*  MG  --  1.9  --  1.8 2.2 2.2  PHOS  --   --   --  2.0* 2.9  --    GFR: Estimated Creatinine Clearance: 79.7 mL/min (by C-G formula based on SCr of 0.72 mg/dL). Recent Labs  Lab 08/12/22 1445 08/12/22 1445 08/12/22 1815 08/13/22 0251 08/14/22 0339 08/15/22 0232 08/15/22 0611 08/16/22 0412  PROCALCITON  --    < > 1.16 1.41 0.95 0.53 0.42  --  WBC 15.0*  --   --  11.6* 13.8* 9.9  --  10.6*  LATICACIDVEN 4.6*  --  1.6  --   --  0.9  --   --    < > = values in this interval not displayed.    Liver Function Tests: Recent Labs  Lab 08/12/22 1445 08/13/22 0251 08/14/22 0339 08/15/22 0232  AST 40 60* 61* 72*  ALT 14 23 31  45*  ALKPHOS 54 52 76 54  BILITOT 1.2 0.8 1.3* 0.9  PROT 6.2* 5.0* 5.9* 5.2*  ALBUMIN 2.9* 2.3* 2.5* 2.2*   No results for input(s): "LIPASE", "AMYLASE" in the last 168 hours. No results for input(s): "AMMONIA" in the last 168 hours.  ABG    Component Value Date/Time   PHART 7.48 (H) 08/15/2022 0212   PCO2ART 32 08/15/2022 0212   PO2ART 216 (H) 08/15/2022 0212   HCO3 22.1 08/15/2022 0909   ACIDBASEDEF 0.2 08/15/2022 0909   O2SAT 92.1 08/15/2022 0909     Coagulation Profile: Recent Labs  Lab 08/13/22 0251  INR 1.3*    Cardiac Enzymes: Recent Labs  Lab 08/14/22 0339  CKTOTAL 928*    HbA1C: No results found for: "HGBA1C"  CBG: No results for input(s): "GLUCAP" in the last 168 hours.      CRITICAL CARE Performed by: Posey Boyer   Total critical care time: 38 minutes  Critical care time was exclusive of separately billable procedures and treating other patients.  Critical care was necessary to treat or prevent imminent  or life-threatening deterioration.  Critical care was time spent personally by me on the following activities: development of treatment plan with patient and/or surrogate as well as nursing, discussions with consultants, evaluation of patient's response to treatment, examination of patient, obtaining history from patient or surrogate, ordering and performing treatments and interventions, ordering and review of laboratory studies, ordering and review of radiographic studies, pulse oximetry and re-evaluation of patient's condition.     Posey Boyer, MSN, AG-ACNP-BC [facility] 08/16/2022, 11:24 AM  See Amion for pager If no response to pager , please call [phone] until 7pm After 7:00 pm call Elink  [phone]

## 2022-08-16 NOTE — TOC Progression Note (Signed)
Transition of Care [facility]) - Progression Note    Patient Details  Name: MURLIN SCHRIEBER MRN: [id number] Date of Birth: 11-27-1950  [facility] of Care [facility]) CM/SW Contact  Delilah Shan, LCSWA Phone Number: 08/16/2022, 3:39 PM  Clinical Narrative:     CSW plans to follow back up on SNF placement closer to patient being medically ready for dc. CSW will continue to follow and assist with patients dc planning needs.     Barriers to Discharge: Continued Medical Work up  Expected Discharge Plan and Services In-house Referral: Clinical Social Work     Living arrangements for the past 2 months: Single Family Home                                       Social Determinants of Health (SDOH) Interventions SDOH Screenings   Food Insecurity: No Food Insecurity (08/12/2022)  Housing: Low Risk  (08/12/2022)  Transportation Needs: No Transportation Needs (08/12/2022)  Utilities: Not At Risk (08/12/2022)  Tobacco Use: Low Risk  (08/12/2022)    Readmission Risk Interventions     No data to display

## 2022-08-16 NOTE — Progress Notes (Signed)
Physical Therapy Treatment Patient Details Name: James Ibarra MRN: [id number] DOB: December 29, 1950 Today's Date: 08/16/2022   History of Present Illness 72 y/o M adm 6/8 from home with weakness, severe sepsis 2/2 multifocal PNA. 6/11 transfer to ICU with hypoxemia and agitation. PMH includes trigeminal neuralgia, facial spasm, HTN and HLD    PT Comments    Pt admitted with above diagnosis. Pt was able to sit EOB for 10 min with mod to min guard assist occasionally. Pt with difficulty with posture with flexed posture and posterior lean. Respiratory compromise with pt confused. Wife present and attentive. Continue PT.  Pt currently with functional limitations due to the deficits listed below (see PT Problem List). Pt will benefit from acute skilled PT to increase their independence and safety with mobility to allow discharge.      Recommendations for follow up therapy are one component of a multi-disciplinary discharge planning process, led by the attending physician.  Recommendations may be updated based on patient status, additional functional criteria and insurance authorization.  Follow Up Recommendations  Can patient physically be transported by private vehicle: No    Assistance Recommended at Discharge Frequent or constant Supervision/Assistance  Patient can return home with the following Two people to help with walking and/or transfers;A lot of help with bathing/dressing/bathroom;Assistance with cooking/housework;Direct supervision/assist for medications management;Direct supervision/assist for financial management;Assist for transportation;Help with stairs or ramp for entrance   Equipment Recommendations  Other (comment) (Per accepting facility)    Recommendations for Other Services       Precautions / Restrictions Precautions Precautions: Fall Precaution Comments: 35L/min, 30% FiO2 Restrictions Weight Bearing Restrictions: No     Mobility  Bed Mobility Overal bed mobility:  Needs Assistance Bed Mobility: Sidelying to Sit, Sit to Supine   Sidelying to sit: Max assist, +2 for physical assistance   Sit to supine: Max assist, +2 for physical assistance   General bed mobility comments: Assist for LEs and for trunk.    Transfers                   General transfer comment: Could not tolerat    Ambulation/Gait                   Stairs             Wheelchair Mobility    Modified Rankin (Stroke Patients Only)       Balance Overall balance assessment: Needs assistance Sitting-balance support: Feet supported Sitting balance-Leahy Scale: Poor Sitting balance - Comments: R lateral lean as well as posterior in sitting needing assist to sit EOB for 10 min                                    Cognition Arousal/Alertness: Awake/alert Behavior During Therapy: Restless Overall Cognitive Status: Difficult to assess                                 General Comments: pt oriented to self, only follows <10% of commands during session        Exercises General Exercises - Lower Extremity Ankle Circles/Pumps: AROM, Both, 10 reps, Seated Long Arc Quad: AROM, Both, 10 reps, Seated    General Comments General comments (skin integrity, edema, etc.): RR to 56 and other VSS with O2 sat >90% on Heated HFNC  Pertinent Vitals/Pain Pain Assessment Pain Assessment: No/denies pain    Home Living                          Prior Function            PT Goals (current goals can now be found in the care plan section) Progress towards PT goals: Progressing toward goals    Frequency    Min 1X/week      PT Plan Current plan remains appropriate    Co-evaluation              AM-PAC PT "6 Clicks" Mobility   Outcome Measure  Help needed turning from your back to your side while in a flat bed without using bedrails?: A Lot Help needed moving from lying on your back to sitting on the side of  a flat bed without using bedrails?: A Lot Help needed moving to and from a bed to a chair (including a wheelchair)?: Total Help needed standing up from a chair using your arms (e.g., wheelchair or bedside chair)?: Total Help needed to walk in hospital room?: Total Help needed climbing 3-5 steps with a railing? : Total 6 Click Score: 8    End of Session Equipment Utilized During Treatment: Gait belt;Oxygen Activity Tolerance: Patient limited by fatigue Patient left: in bed;with call bell/phone within reach;with bed alarm set;with family/visitor present Nurse Communication: Mobility status PT Visit Diagnosis: Other abnormalities of gait and mobility (R26.89)     Time: 0981-1914 PT Time Calculation (min) (ACUTE ONLY): 22 min  Charges:  $Therapeutic Activity: 8-22 mins                     [facility] M,PT Acute Rehab Services [phone]    Bevelyn Buckles 08/16/2022, 2:23 PM

## 2022-08-16 NOTE — Progress Notes (Signed)
eLink Physician-Brief Progress Note Patient Name: James Ibarra DOB: 1951-02-27 MRN: [id number]   Date of Service  08/16/2022  HPI/Events of Note  Patient with persistent atrial fibrillation with RVR despite Amiodarone iv bolus.  eICU Interventions  Will put patient back on a Cardizem gtt.        James Ibarra 08/16/2022, 4:06 AM

## 2022-08-16 NOTE — Progress Notes (Signed)
eLink Physician-Brief Progress Note Patient Name: James Ibarra DOB: January 24, 1951 MRN: [id number]   Date of Service  08/16/2022  HPI/Events of Note  Patient was in atrial fibrillation with RVR (rate 120-130) but he just broke and is currently in sinus rhythm, his most recent electrolytes were from 24 hours ago.  eICU Interventions  BMP, Mg++ ordered.        Avanti Jetter U Rilley Stash 08/16/2022, 1:12 AM

## 2022-08-16 NOTE — Progress Notes (Signed)
eLink Physician-Brief Progress Note Patient Name: James Ibarra DOB: 16-Aug-1950 MRN: [id number]   Date of Service  08/16/2022  HPI/Events of Note  Patient back in atrial fibrillation with RVR (heart rate in the 130's), QTC 391 msec.  eICU Interventions  Amiodarone 150 mg iv x 1 ordered.        James Ibarra 08/16/2022, 3:06 AM

## 2022-08-17 DIAGNOSIS — A419 Sepsis, unspecified organism: Secondary | ICD-10-CM | POA: Diagnosis not present

## 2022-08-17 DIAGNOSIS — R6520 Severe sepsis without septic shock: Secondary | ICD-10-CM | POA: Diagnosis not present

## 2022-08-17 LAB — CBC
HCT: 33.9 % — ABNORMAL LOW (ref 39.0–52.0)
Hemoglobin: 11.5 g/dL — ABNORMAL LOW (ref 13.0–17.0)
MCH: 30.7 pg (ref 26.0–34.0)
MCHC: 33.9 g/dL (ref 30.0–36.0)
MCV: 90.4 fL (ref 80.0–100.0)
Platelets: 313 10*3/uL (ref 150–400)
RBC: 3.75 MIL/uL — ABNORMAL LOW (ref 4.22–5.81)
RDW: 13.2 % (ref 11.5–15.5)
WBC: 12.2 10*3/uL — ABNORMAL HIGH (ref 4.0–10.5)
nRBC: 0 % (ref 0.0–0.2)

## 2022-08-17 LAB — BASIC METABOLIC PANEL
Anion gap: 12 (ref 5–15)
BUN: 15 mg/dL (ref 8–23)
CO2: 21 mmol/L — ABNORMAL LOW (ref 22–32)
Calcium: 8 mg/dL — ABNORMAL LOW (ref 8.9–10.3)
Chloride: 102 mmol/L (ref 98–111)
Creatinine, Ser: 0.66 mg/dL (ref 0.61–1.24)
GFR, Estimated: >60 mL/min (ref >60)
Glucose, Bld: 100 mg/dL — ABNORMAL HIGH (ref 70–99)
Potassium: 3.2 mmol/L — ABNORMAL LOW (ref 3.5–5.1)
Sodium: 135 mmol/L (ref 135–145)

## 2022-08-17 LAB — GLUCOSE, CAPILLARY
Glucose-Capillary: 100 mg/dL — ABNORMAL HIGH (ref 70–99)
Glucose-Capillary: 102 mg/dL — ABNORMAL HIGH (ref 70–99)
Glucose-Capillary: 105 mg/dL — ABNORMAL HIGH (ref 70–99)
Glucose-Capillary: 106 mg/dL — ABNORMAL HIGH (ref 70–99)
Glucose-Capillary: 107 mg/dL — ABNORMAL HIGH (ref 70–99)
Glucose-Capillary: 112 mg/dL — ABNORMAL HIGH (ref 70–99)
Glucose-Capillary: 115 mg/dL — ABNORMAL HIGH (ref 70–99)
Glucose-Capillary: 133 mg/dL — ABNORMAL HIGH (ref 70–99)

## 2022-08-17 LAB — PROCALCITONIN: Procalcitonin: 0.21 ng/mL

## 2022-08-17 LAB — HEPATIC FUNCTION PANEL
ALT: 90 U/L — ABNORMAL HIGH (ref 0–44)
AST: 102 U/L — ABNORMAL HIGH (ref 15–41)
Albumin: 2.2 g/dL — ABNORMAL LOW (ref 3.5–5.0)
Alkaline Phosphatase: 48 U/L (ref 38–126)
Bilirubin, Direct: 0.2 mg/dL (ref 0.0–0.2)
Indirect Bilirubin: 0.5 mg/dL (ref 0.3–0.9)
Total Bilirubin: 0.7 mg/dL (ref 0.3–1.2)
Total Protein: 5.5 g/dL — ABNORMAL LOW (ref 6.5–8.1)

## 2022-08-17 LAB — CULTURE, BLOOD (ROUTINE X 2): Special Requests: ADEQUATE

## 2022-08-17 LAB — PHOSPHORUS: Phosphorus: 2.5 mg/dL (ref 2.5–4.6)

## 2022-08-17 LAB — MAGNESIUM: Magnesium: 2.1 mg/dL (ref 1.7–2.4)

## 2022-08-17 MED ORDER — FOLIC ACID 5 MG/ML IJ SOLN
1.0000 mg | Freq: Every day | INTRAMUSCULAR | Status: DC
  Administered 2022-08-17 – 2022-08-21 (×5): 1 mg via INTRAVENOUS
  Filled 2022-08-17 (×6): qty 0.2

## 2022-08-17 MED ORDER — METOPROLOL TARTRATE 5 MG/5ML IV SOLN
5.0000 mg | Freq: Four times a day (QID) | INTRAVENOUS | Status: DC
  Administered 2022-08-17 – 2022-08-19 (×6): 5 mg via INTRAVENOUS
  Filled 2022-08-17 (×7): qty 5

## 2022-08-17 MED ORDER — FOOD THICKENER (SIMPLYTHICK HONEY): 5.0000 | ORAL | Status: DC | PRN

## 2022-08-17 MED ORDER — METOPROLOL TARTRATE 5 MG/5ML IV SOLN
5.0000 mg | Freq: Once | INTRAVENOUS | Status: AC
  Administered 2022-08-17: 5 mg via INTRAVENOUS
  Filled 2022-08-17: qty 5

## 2022-08-17 MED ORDER — LACTATED RINGERS IV SOLN: INTRAVENOUS | Status: AC

## 2022-08-17 MED ORDER — POTASSIUM CHLORIDE 10 MEQ/100ML IV SOLN
10.0000 meq | INTRAVENOUS | Status: AC
  Administered 2022-08-17 (×6): 10 meq via INTRAVENOUS
  Filled 2022-08-17 (×6): qty 100

## 2022-08-17 MED ORDER — SODIUM CHLORIDE 0.9 % IV SOLN: INTRAVENOUS | Status: DC | PRN

## 2022-08-17 NOTE — Progress Notes (Signed)
eLink Physician-Brief Progress Note Patient Name: James Ibarra DOB: Apr 24, 1950 MRN: [id number]   Date of Service  08/17/2022  HPI/Events of Note  Notified that pt was back in Afib RVR, with HR 130s-150s.  SBP 120s currently.   eICU Interventions  Will give 1 time dose of metoprolol 5mg  IV If remains tachycardic, not hypotensive, would plan to restart cardizem infusion.        Mariateresa Batra M DELA CRUZ 08/17/2022, 11:29 PM

## 2022-08-17 NOTE — TOC Progression Note (Signed)
Transition of Care [facility]) - Progression Note    Patient Details  Name: James Ibarra MRN: [id number] Date of Birth: [birth year]  [facility]) CM/SW Contact  Delilah Shan, LCSWA Phone Number: 08/17/2022, 5:08 PM  Clinical Narrative:      CSW plans to follow back up on SNF placement closer to patient being medically ready for dc. CSW received consult for substance use resources. CSW following to speak with patient when appropriate.CSW will continue to follow and assist with patients dc planning needs.   Barriers to Discharge: Continued Medical Work up  Expected Discharge Plan and Services In-house Referral: Clinical Social Work     Living arrangements for the past 2 months: Single Family Home                                       Social Determinants of Health (SDOH) Interventions SDOH Screenings   Food Insecurity: No Food Insecurity (08/12/2022)  Housing: Low Risk  (08/12/2022)  Transportation Needs: No Transportation Needs (08/12/2022)  Utilities: Not At Risk (08/12/2022)  Tobacco Use: Low Risk  (08/12/2022)    Readmission Risk Interventions     No data to display

## 2022-08-17 NOTE — Evaluation (Signed)
Clinical/Bedside Swallow Evaluation Patient Details  Name: James Ibarra MRN: [id number] Date of Birth: [birth year]  Today's Date: 08/17/2022 Time: SLP Start Time (ACUTE ONLY): 1400 SLP Stop Time (ACUTE ONLY): 1421 SLP Time Calculation (min) (ACUTE ONLY): 21 min  Past Medical History:  Past Medical History:  Diagnosis Date   History of nonmelanoma skin cancer    Hypercholesteremia    Hypertension    Trigeminal neuralgia    Past Surgical History:  Past Surgical History:  Procedure Laterality Date   HERNIA REPAIR  11/07/11   [facility]   INGUINAL HERNIA REPAIR  11/07/2011   Procedure: LAPAROSCOPIC INGUINAL HERNIA;  Surgeon: Atilano Ina, MD,FACS;  Location: [facility] ORS;  Service: General;  Laterality: Left;   RETINAL DETACHMENT SURGERY  1997   SKIN CANCER EXCISION  2012, 1999   basal cell - neck and hand   HPI:  72 y/o M adm 6/8 from home with weakness, severe sepsis 2/2 multifocal PNA, acute metabolic encephalopathy with alcohol withdrawal symptoms. 6/11 transfer to ICU with hypoxemia and agitation. PMH includes trigeminal neuralgia, facial spasm, HTN and HLD    Assessment / Plan / Recommendation  Clinical Impression  Pt was lethargic, following commands intermittently, but able to sustain attention sufficiently to participate in initial swallowing assessment. Wife was at bedside. Oral mechanism exam was normal. Pt with baseline cough that was potentially exacerbated after drinking thin and nectar thick liquids; coughing abated when eating purees and drinking honey-thick liquids.  RR was in high 30s throughout assessment, raising concerns for dysynchrony during swallow/breathing cycles.  For today, recommend starting conservative diet of dysphagia 1/honey-thick liquids.  Give meds whole in liquid or purees. May have ice chips between meals.  Will f/u to determine readiness for diet advancement vs instrumental swallow study. D/W pt's wife and RN. SLP Visit Diagnosis: Dysphagia, unspecified (R13.10)     Aspiration Risk    unknown   Diet Recommendation   Dysphagia 1/ honey-thick liquids  Medication Administration: Whole meds with liquid    Other  Recommendations Oral Care Recommendations: Oral care BID    Recommendations for follow up therapy are one component of a multi-disciplinary discharge planning process, led by the attending physician.  Recommendations may be updated based on patient status, additional functional criteria and insurance authorization.  Follow up Recommendations Other (comment) (tba)          Functional Status Assessment Patient has had a recent decline in their functional status and demonstrates the ability to make significant improvements in function in a reasonable and predictable amount of time.  Frequency and Duration min 2x/week  1 week       Prognosis Prognosis for improved oropharyngeal function: Good      Swallow Study   General Date of Onset: 08/12/22 HPI: 72 y/o M adm 6/8 from home with weakness, severe sepsis 2/2 multifocal PNA, acute metabolic encephalopathy with alcohol withdrawal symptoms. 6/11 transfer to ICU with hypoxemia and agitation. PMH includes trigeminal neuralgia, facial spasm, HTN and HLD Type of Study: Bedside Swallow Evaluation Previous Swallow Assessment: no Diet Prior to this Study: NPO Temperature Spikes Noted: No Respiratory Status: Room air History of Recent Intubation: No Behavior/Cognition: Lethargic/Drowsy Oral Cavity Assessment: Within Functional Limits Oral Care Completed by SLP: Recent completion by staff Oral Cavity - Dentition: Adequate natural dentition Self-Feeding Abilities: Needs assist Patient Positioning: Upright in bed Baseline Vocal Quality: Normal Volitional Cough: Cognitively unable to elicit Volitional Swallow: Able to elicit    Oral/Motor/Sensory Function Overall Oral Motor/Sensory Function:  Within functional limits   Ice Chips Ice chips: Within functional limits   Thin Liquid Thin Liquid:  Impaired Presentation: Cup Pharyngeal  Phase Impairments: Cough - Immediate    Nectar Thick Nectar Thick Liquid: Impaired Presentation: Cup Pharyngeal Phase Impairments: Cough - Immediate   Honey Thick Honey Thick Liquid: Within functional limits   Puree Puree: Within functional limits   Solid     Solid: Not tested      James Ibarra James Ibarra 08/17/2022,2:53 PM James Folks L. Samson Frederic, MA CCC/SLP Clinical Specialist - Acute Care SLP Acute Rehabilitation Services Office number [phone]

## 2022-08-17 NOTE — Progress Notes (Signed)
NAME:  James Ibarra, MRN:  [id number], DOB:  06/10/1950, LOS: 4 ADMISSION DATE:  08/12/2022, CONSULTATION DATE:  08/17/22 REFERRING MD:  Loney Loh, CHIEF COMPLAINT:  worsening respiratory status   History of Present Illness:  James Ibarra is a 72 y.o. M with PMH significant for Mitral regurgitation and prolapse, ETOH abuse, trigeminal neuralgia, HTN, HL who presented to the ED 6/8 with fever, cough and shortness of breath for three days.  In the ED, he was febrile and tachycardic and CXR showed R sided pneumonia. He was admitted to [facility] and treated with ceftriaxone and azithromycin.  During his hospital course he developed new onset atrial fibrillation and transiently required a cardizem gtt along with confusion.  Pt's wife reported liquor/ wine consumption daily.    On 6/11 pt's oxygen requirement increased rapidly from 5L [place] to 55 L HFNC, he was initially agitated and received Ativan 3mg  and Tussionex, CXR with worsening R-sided infiltrate and he became more somnolent.  PCCM consulted in the this context.    Pertinent  Medical History   has a past medical history of History of nonmelanoma skin cancer, Hypercholesteremia, Hypertension, and Trigeminal neuralgia.  Significant Hospital Events: Including procedures, antibiotic start and stop dates in addition to other pertinent events   6/8 admit to [facility] with PNA 6/11 worsening R-sided infiltrate, hypoxia and mental status, transfer to ICU on HFNC 6/12 phenobarb taper added, confused, IV cardizem   Interim History / Subjective:  Still intermittently agitated/restless overnight, but awake this morning, answering questions, confused Failed bedside swallow   Objective   Blood pressure (!) 149/78, pulse 85, temperature 98 F (36.7 C), temperature source Oral, resp. rate (!) 37, height 6\' 2"  (1.88 m), weight 66.5 kg, SpO2 98 %.    FiO2 (%):  [30 %-32 %] 32 %   Intake/Output Summary (Last 24 hours) at 08/17/2022 1021 Last data filed at 08/17/2022  0700 Gross per 24 hour  Intake 801.14 ml  Output 550 ml  Net 251.14 ml   Filed Weights   08/12/22 1809 08/16/22 0500  Weight: 71.9 kg 66.5 kg   General:  Thin elderly male sitting upright in bed, mildly restless HEENT: MM pink/moist, pupils 4/r, anicteric Neuro: Awake, verbal, oriented to self and [place], otherwise confused, f/c, MAE CV: rr, NSR few PACs, +murmur apex PULM:  non labored but remains tachypneic/ shallowed breathing at times, will not wear [place]> not desaturating, RA 94-96, rhonchi on R base, congested cough GI: soft, bs+, NT, purwick with darker amber urine  Extremities: warm/dry, no LE edema  Skin: no rashes   Labs reviewed> PCT 0.42> 0.21, K 3.2, sCr 0.66, AST/ ALT 72/ 90 102/ 45, WBC 12.2 Afebrile  Resolved Hospital Problem list     Assessment & Plan:   Acute Hypoxic Respiratory Failure secondary to R-sided pneumonia, +Rhinovirus, viral and probable bacterial component - urine strep ag negative.   - pt will not keep [place] in, but not desaturating.  Ongoing tachypnea, at risk for resp fatigue, cont to monitor - remains NPO, pending SLP after failed bedside swallow - prn supplemental O2 for goal > 92% - BC neg - clinically improving, down trending PCT.  Stop abx after today, completes 6 days - cont pulmonary hygiene as tolerated, IS, CPT - droplet precautions  Acute Metabolic Encephalopathy ETOH withdrawal - cont phenobarb taper w/ prn ativan - cont thiamine/ folate, add MVI when able - cont supportive care - aspiration and delirium precautions - will need TOC consult for ETOH abuse,  referral for depression  - of note, left eye twitch> is not new, hx of, was getting botox for but has not needed in last 6-8 months - remains NPO, add MIVF for now pending further SLP eval, stable sCr but slowly dropping off UOP and concentrated urine.     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  Remains NPO, SLP ordered.  May need cortrak 6/14 for nutrition  Mild Transaminitis  - slightly up today, trend LFTs, consider RUQ [place]  Trigeminal neuralgia  - cont to hold gabapentin 600mg  QID, while NPO  Best Practice (right click and "Reselect all SmartList Selections" daily)   Diet/type: NPO DVT prophylaxis: SCD GI prophylaxis: N/A Lines: N/A Foley:  N/A Code Status:  full code Last date of multidisciplinary goals of care discussion [pending, will try to reach wife to update regarding transfer]  Wife updated at bedside 6/12 am.  Pending 6/13.  Labs   CBC: Recent Labs  Lab 08/12/22 1445 08/13/22 0251 08/14/22 0339 08/15/22 0232 08/16/22 0412 08/17/22 0024  WBC 15.0* 11.6* 13.8* 9.9 10.6* 12.2*  NEUTROABS 14.3*  --   --   --  8.1*  --   HGB 13.5 11.1* 12.0* 11.5* 12.2* 11.5*  HCT 40.6 33.0* 35.6* 33.0* 35.9* 33.9*  MCV 96.0 92.7 93.9 92.4 90.2 90.4  PLT 214 190 197 215 254 313    Basic Metabolic Panel: Recent Labs  Lab 08/13/22 0250 08/13/22 0251 08/14/22 0339 08/15/22 0232 08/16/22 0412 08/17/22 0024  NA  --  137 132* 134* 137 135  K  --  3.3* 3.5 3.9 3.6 3.2*  CL  --  101 96* 104 105 102  CO2  --  27 24 21* 20* 21*  GLUCOSE  --  122* 101* 97 114* 100*  BUN  --  13 9 10 14 15   CREATININE  --  0.60* 0.72 0.57* 0.72 0.66  CALCIUM  --  8.4* 8.3* 7.8* 8.0* 8.0*  MG 1.9  --  1.8 2.2 2.2 2.1  PHOS  --   --  2.0* 2.9  --  2.5   GFR: Estimated Creatinine Clearance: 79.7 mL/min (by C-G formula based on SCr of  0.66 mg/dL). Recent Labs  Lab 08/12/22 1445 08/12/22 1815 08/13/22 0251 08/14/22 0339 08/15/22 0232 08/15/22 0611 08/16/22 0412 08/17/22 0024  PROCALCITON  --  1.16   < > 0.95 0.53 0.42  --  0.21  WBC 15.0*  --    < > 13.8* 9.9  --  10.6* 12.2*  LATICACIDVEN 4.6* 1.6  --   --  0.9  --   --   --    < > = values in this interval not displayed.    Liver Function Tests: Recent Labs  Lab 08/12/22 1445 08/13/22 0251 08/14/22 0339 08/15/22 0232 08/17/22 0024  AST 40 60* 61* 72* 102*  ALT 14 23 31  45* 90*  ALKPHOS 54 52 76 54 48  BILITOT 1.2  0.8 1.3* 0.9 0.7  PROT 6.2* 5.0* 5.9* 5.2* 5.5*  ALBUMIN 2.9* 2.3* 2.5* 2.2* 2.2*   No results for input(s): "LIPASE", "AMYLASE" in the last 168 hours. No results for input(s): "AMMONIA" in the last 168 hours.  ABG    Component Value Date/Time   PHART 7.48 (H) 08/15/2022 0212   PCO2ART 32 08/15/2022 0212   PO2ART 216 (H) 08/15/2022 0212   HCO3 22.1 08/15/2022 0909   ACIDBASEDEF 0.2 08/15/2022 0909   O2SAT 92.1 08/15/2022 0909     Coagulation Profile: Recent Labs  Lab 08/13/22 0251  INR 1.3*    Cardiac Enzymes: Recent Labs  Lab 08/14/22 0339  CKTOTAL 928*    HbA1C: No results found for: "HGBA1C"  CBG: Recent Labs  Lab 08/16/22 2125 08/17/22 0004 08/17/22 0409 08/17/22 0829  GLUCAP 112* 105* 106* 102*        CRITICAL CARE Performed by: Posey Boyer   Total critical care time: 33 minutes  Critical care time was exclusive of separately billable procedures and treating other patients.  Critical care was necessary to treat or prevent imminent or life-threatening deterioration.  Critical care was time spent personally by me on the following activities: development of treatment plan with patient and/or surrogate as well as nursing, discussions with consultants, evaluation of patient's response to treatment, examination of patient, obtaining history from patient or surrogate, ordering and performing  treatments and interventions, ordering and review of laboratory studies, ordering and review of radiographic studies, pulse oximetry and re-evaluation of patient's condition.     Posey Boyer, MSN, AG-ACNP-BC  Pulmonary & Critical Care 08/17/2022, 10:21 AM  See Amion for pager If no response to pager , please call [phone] until 7pm After 7:00 pm call Elink  [phone]

## 2022-08-17 NOTE — Progress Notes (Signed)
Occupational Therapy Treatment Patient Details Name: James Ibarra MRN: [id number] DOB: 10-20-1950 Today's Date: 08/17/2022   History of present illness 72 y/o M adm 6/8 from home with weakness, severe sepsis 2/2 multifocal PNA. 6/11 transfer to ICU with hypoxemia and agitation. PMH includes trigeminal neuralgia, facial spasm, HTN and HLD   OT comments  Patient is lethargic, but less restless and not agitated.  Able to sit EOB with up to Max A with poor sitting balance.  Stood at bedside with Max A initially, and able to support his own weight, but Max A to side step to [employer].  Patient able to follow basic commands.  OT to continue efforts in the acute setting to address deficits, and Patient will benefit from continued inpatient follow up therapy, <3 hours/day     Recommendations for follow up therapy are one component of a multi-disciplinary discharge planning process, led by the attending physician.  Recommendations may be updated based on patient status, additional functional criteria and insurance authorization.    Assistance Recommended at Discharge Frequent or constant Supervision/Assistance  Patient can return home with the following  Two people to help with walking and/or transfers;A lot of help with bathing/dressing/bathroom;Assistance with cooking/housework;Assistance with feeding;Direct supervision/assist for financial management;Direct supervision/assist for medications management;Assist for transportation;Help with stairs or ramp for entrance   Equipment Recommendations  Other (comment)    Recommendations for Other Services      Precautions / Restrictions Precautions Precautions: Fall Precaution Comments: Mitts for restraints Restrictions Weight Bearing Restrictions: No       Mobility Bed Mobility Overal bed mobility: Needs Assistance Bed Mobility: Sidelying to Sit, Sit to Supine   Sidelying to sit: Max assist   Sit to supine: Max assist   General bed mobility  comments: Assist for LEs and for trunk.    Transfers Overall transfer level: Needs assistance Equipment used: 2 person hand held assist Transfers: Sit to/from Stand Sit to Stand: Mod assist, Max assist           General transfer comment: able to take two side steps to [facility]     Balance Overall balance assessment: Needs assistance Sitting-balance support: Feet supported Sitting balance-Leahy Scale: Poor   Postural control: Posterior lean, Right lateral lean Standing balance support: Bilateral upper extremity supported Standing balance-Leahy Scale: Poor                             ADL either performed or assessed with clinical judgement   ADL       Grooming: Sitting;Wash/dry face;Maximal assistance                                      Extremity/Trunk Assessment Upper Extremity Assessment Upper Extremity Assessment: Generalized weakness   Lower Extremity Assessment Lower Extremity Assessment: Defer to PT evaluation   Cervical / Trunk Assessment Cervical / Trunk Assessment: Kyphotic    Vision   Vision Assessment?: No apparent visual deficits   Perception Perception Perception: Not tested   Praxis Praxis Praxis: Not tested    Cognition Arousal/Alertness: Awake/alert Behavior During Therapy: Restless Overall Cognitive Status: Impaired/Different from baseline Area of Impairment: Orientation, Following commands, Awareness                 Orientation Level: Person     Following Commands: Follows multi-step commands with increased time, Follows one step commands inconsistently  Awareness: Intellectual            Exercises      Shoulder Instructions       General Comments  VSS    Pertinent Vitals/ Pain       Pain Assessment Pain Assessment: Faces Faces Pain Scale: No hurt Pain Intervention(s): Monitored during session                                                           Frequency  Min 1X/week        Progress Toward Goals  OT Goals(current goals can now be found in the care plan section)     Acute Rehab OT Goals OT Goal Formulation: Patient unable to participate in goal setting Time For Goal Achievement: 08/28/22 Potential to Achieve Goals: Good  Plan Discharge plan remains appropriate    Co-evaluation                 AM-PAC OT "6 Clicks" Daily Activity     Outcome Measure   Help from another person eating meals?: A Lot Help from another person taking care of personal grooming?: A Lot Help from another person toileting, which includes using toliet, bedpan, or urinal?: Total Help from another person bathing (including washing, rinsing, drying)?: A Lot Help from another person to put on and taking off regular upper body clothing?: A Lot Help from another person to put on and taking off regular lower body clothing?: Total 6 Click Score: 10    End of Session Equipment Utilized During Treatment: Gait belt  OT Visit Diagnosis: Unsteadiness on feet (R26.81);Other abnormalities of gait and mobility (R26.89);Muscle weakness (generalized) (M62.81)   Activity Tolerance Patient limited by fatigue   Patient Left in bed;with call bell/phone within reach;with bed alarm set;with family/visitor present   Nurse Communication Mobility status        Time: 1610-9604 OT Time Calculation (min): 21 min  Charges: OT General Charges $OT Visit: 1 Visit OT Treatments $Self Care/Home Management : 8-22 mins  08/17/2022  RP, OTR/L  Acute Rehabilitation Services  Office:  [phone]   James Ibarra 08/17/2022, 1:59 PM

## 2022-08-17 NOTE — Progress Notes (Signed)
Notified of worsening rash on pt's back, few areas noticed yesterday but much more diffuse today> just on posterior back/ upper shoulders.  Non-pruritic.  Wife reports their grandchildren developed rashes after recent viral illness they were exposed to/ became sick with.  Doubt drug/ abx rxn, day 6x of abx.    Continue to monitor rash.  Considering viral vs contact dermatitis.

## 2022-08-18 ENCOUNTER — Other Ambulatory Visit (HOSPITAL_COMMUNITY): Payer: Self-pay

## 2022-08-18 ENCOUNTER — Inpatient Hospital Stay (HOSPITAL_COMMUNITY): Payer: Medicare Other

## 2022-08-18 DIAGNOSIS — A419 Sepsis, unspecified organism: Secondary | ICD-10-CM | POA: Diagnosis not present

## 2022-08-18 DIAGNOSIS — R6520 Severe sepsis without septic shock: Secondary | ICD-10-CM | POA: Diagnosis not present

## 2022-08-18 LAB — CBC
HCT: 35.6 % — ABNORMAL LOW (ref 39.0–52.0)
Hemoglobin: 11.8 g/dL — ABNORMAL LOW (ref 13.0–17.0)
MCH: 29.9 pg (ref 26.0–34.0)
MCHC: 33.1 g/dL (ref 30.0–36.0)
MCV: 90.4 fL (ref 80.0–100.0)
Platelets: 372 10*3/uL (ref 150–400)
RBC: 3.94 MIL/uL — ABNORMAL LOW (ref 4.22–5.81)
RDW: 13.1 % (ref 11.5–15.5)
WBC: 14.8 10*3/uL — ABNORMAL HIGH (ref 4.0–10.5)
nRBC: 0 % (ref 0.0–0.2)

## 2022-08-18 LAB — BASIC METABOLIC PANEL
Anion gap: 12 (ref 5–15)
BUN: 12 mg/dL (ref 8–23)
CO2: 21 mmol/L — ABNORMAL LOW (ref 22–32)
Calcium: 8.2 mg/dL — ABNORMAL LOW (ref 8.9–10.3)
Chloride: 103 mmol/L (ref 98–111)
Creatinine, Ser: 0.61 mg/dL (ref 0.61–1.24)
GFR, Estimated: >60 mL/min (ref >60)
Glucose, Bld: 99 mg/dL (ref 70–99)
Potassium: 4.2 mmol/L (ref 3.5–5.1)
Sodium: 136 mmol/L (ref 135–145)

## 2022-08-18 LAB — GLUCOSE, CAPILLARY
Glucose-Capillary: 98 mg/dL (ref 70–99)
Glucose-Capillary: 124 mg/dL — ABNORMAL HIGH (ref 70–99)
Glucose-Capillary: 104 mg/dL — ABNORMAL HIGH (ref 70–99)
Glucose-Capillary: 114 mg/dL — ABNORMAL HIGH (ref 70–99)
Glucose-Capillary: 96 mg/dL (ref 70–99)
Glucose-Capillary: 101 mg/dL — ABNORMAL HIGH (ref 70–99)

## 2022-08-18 LAB — COMPREHENSIVE METABOLIC PANEL
ALT: 106 U/L — ABNORMAL HIGH (ref 0–44)
AST: 119 U/L — ABNORMAL HIGH (ref 15–41)
Albumin: 2.2 g/dL — ABNORMAL LOW (ref 3.5–5.0)
Alkaline Phosphatase: 55 U/L (ref 38–126)
Anion gap: 11 (ref 5–15)
BUN: 14 mg/dL (ref 8–23)
CO2: 21 mmol/L — ABNORMAL LOW (ref 22–32)
Calcium: 8.1 mg/dL — ABNORMAL LOW (ref 8.9–10.3)
Chloride: 104 mmol/L (ref 98–111)
Creatinine, Ser: 0.6 mg/dL — ABNORMAL LOW (ref 0.61–1.24)
GFR, Estimated: >60 mL/min (ref >60)
Glucose, Bld: 110 mg/dL — ABNORMAL HIGH (ref 70–99)
Potassium: 3.3 mmol/L — ABNORMAL LOW (ref 3.5–5.1)
Sodium: 136 mmol/L (ref 135–145)
Total Bilirubin: 0.8 mg/dL (ref 0.3–1.2)
Total Protein: 5.8 g/dL — ABNORMAL LOW (ref 6.5–8.1)

## 2022-08-18 LAB — MAGNESIUM
Magnesium: 1.8 mg/dL (ref 1.7–2.4)
Magnesium: 2 mg/dL (ref 1.7–2.4)
Magnesium: 1.9 mg/dL (ref 1.7–2.4)
Magnesium: 2 mg/dL (ref 1.7–2.4)

## 2022-08-18 LAB — PHOSPHORUS
Phosphorus: 2.3 mg/dL — ABNORMAL LOW (ref 2.5–4.6)
Phosphorus: 1.7 mg/dL — ABNORMAL LOW (ref 2.5–4.6)
Phosphorus: 2.5 mg/dL (ref 2.5–4.6)

## 2022-08-18 MED ORDER — OSMOLITE 1.5 CAL PO LIQD
1000.0000 mL | ORAL | Status: DC
  Administered 2022-08-19 (×2): 1000 mL
  Filled 2022-08-18 (×5): qty 1000

## 2022-08-18 MED ORDER — POTASSIUM CHLORIDE CRYS ER 20 MEQ PO TBCR: 20.0000 meq | EXTENDED_RELEASE_TABLET | ORAL | Status: DC

## 2022-08-18 MED ORDER — PROSOURCE TF20 ENFIT COMPATIBL EN LIQD
60.0000 mL | Freq: Every day | ENTERAL | Status: DC
  Administered 2022-08-19 – 2022-08-21 (×3): 60 mL
  Filled 2022-08-18 (×3): qty 60

## 2022-08-18 MED ORDER — K PHOS MONO-SOD PHOS DI & MONO 155-852-130 MG PO TABS
500.0000 mg | ORAL_TABLET | Freq: Three times a day (TID) | ORAL | Status: DC
  Administered 2022-08-18 (×2): 500 mg via ORAL
  Filled 2022-08-18 (×2): qty 2

## 2022-08-18 MED ORDER — POTASSIUM CHLORIDE 20 MEQ PO PACK
20.0000 meq | PACK | ORAL | Status: AC
  Administered 2022-08-18: 20 meq via ORAL
  Filled 2022-08-18: qty 1

## 2022-08-18 MED ORDER — FREE WATER
150.0000 mL | Status: DC
  Administered 2022-08-19 – 2022-08-22 (×18): 150 mL

## 2022-08-18 MED ORDER — POTASSIUM CHLORIDE 10 MEQ/100ML IV SOLN
10.0000 meq | INTRAVENOUS | Status: AC
  Administered 2022-08-18 (×4): 10 meq via INTRAVENOUS
  Filled 2022-08-18 (×3): qty 100

## 2022-08-18 MED ORDER — VITAL HIGH PROTEIN PO LIQD: 1000.0000 mL | ORAL | Status: DC

## 2022-08-18 NOTE — Progress Notes (Signed)
Speech Language Pathology Treatment: Dysphagia  Patient Details Name: James Ibarra MRN: [id number] DOB: [birth year] Today's Date: 08/18/2022 Time: 6578-4696 SLP Time Calculation (min) (ACUTE ONLY): 46 min  Assessment / Plan / Recommendation Clinical Impression  Pt transferred to 5W. Wife at bedside.  Remains confused, lethargic.  He awakened and accepted trials of thin and nectar thick liquids, both of which continued to elicit coughing.  Honey thick liquids were better tolerated. He remains an aspiration risk due to his compromised mental status. Continue dysphagia 1/honey thick liquids over the weekend.  Uncertain if he will maintain alertness necessary to take in adequate POs. D/W RN, wife. Will follow.   HPI HPI: 72 y/o M adm 6/8 from home with weakness, severe sepsis 2/2 multifocal PNA, acute metabolic encephalopathy with alcohol withdrawal symptoms. 6/11 transfer to ICU with hypoxemia and agitation. PMH includes trigeminal neuralgia, facial spasm, HTN and HLD      SLP Plan  Continue with current plan of care      Recommendations for follow up therapy are one component of a multi-disciplinary discharge planning process, led by the attending physician.  Recommendations may be updated based on patient status, additional functional criteria and insurance authorization.    Recommendations  Diet recommendations: Dysphagia 1 (puree);Honey-thick liquid Liquids provided via: Cup;Straw Medication Administration: Whole meds with liquid Supervision: Full supervision/cueing for compensatory strategies;Trained caregiver to feed patient Compensations: Minimize environmental distractions                  Oral care BID   Frequent or constant Supervision/Assistance Dysphagia, unspecified (R13.10)     Continue with current plan of care   James Ibarra L. James Frederic, MA CCC/SLP Clinical Specialist - Acute Care SLP Acute Rehabilitation Services Office number [phone]   James Ibarra  James Ibarra  08/18/2022, 1:31 PM

## 2022-08-18 NOTE — Procedures (Signed)
Cortrak  Person Inserting Tube:  Clinton Dragone T, RD Tube Type:  Cortrak - 43 inches Tube Size:  10 Tube Location:  Left nare Secured by: Bridle Technique Used to Measure Tube Placement:  Marking at nare/corner of mouth Cortrak Secured At:  70 cm   Cortrak Tube Team Note:  Consult received to place a Cortrak feeding tube.   X-ray is required, abdominal x-ray has been ordered by the Cortrak team. Please confirm tube placement before using the Cortrak tube.   If the tube becomes dislodged please keep the tube and contact the Cortrak team at [email or web address] for replacement.  If after hours and replacement cannot be delayed, place a NG tube and confirm placement with an abdominal x-ray.    Tevis Conger RD, LDN For contact information, refer to AMiON.   

## 2022-08-18 NOTE — Progress Notes (Signed)
Pt to transfer to 5W01. Report called to [facility]. Pt to transfer via wheelchair monitored with RN.

## 2022-08-18 NOTE — Progress Notes (Signed)
[facility] ADULT ICU REPLACEMENT PROTOCOL   The patient does apply for the [facility] Adult ICU Electrolyte Replacment Protocol based on the criteria listed below:   1.Exclusion criteria: TCTS, ECMO, Dialysis, and Myasthenia Gravis patients 2. Is GFR >/= 30 ml/min? Yes.    Patient's GFR today is >60 3. Is SCr </= 2? Yes.   Patient's SCr is 0.6 mg/dL 4. Did SCr increase >/= 0.5 in 24 hours? No. 5.Pt's weight >40kg  Yes.   6. Abnormal electrolyte(s): K 3.3  7. Electrolytes replaced per protocol 8.  Call MD STAT for K+ </= 2.5, Phos </= 1, or Mag </= 1 Physician:    Markus Daft A 08/18/2022 1:16 AM

## 2022-08-18 NOTE — Progress Notes (Signed)
Initial Nutrition Assessment  DOCUMENTATION CODES:  Severe malnutrition in context of chronic illness  INTERVENTION:  Once phosphorus is above 2, then start tube feeds via Cortrak: Start Osmolite 1.5 at 20 mL/hr; advance by 10 mL every 12 hours to goal rate of 55 mL/hr. (1320 mL per day) 60 mL ProSource TF20 - daily Free water flush: 150 mL q4h Tube feeds at goal provides 2060 kcal, 103 gm protein, and 1906 mL total free water daily.  Monitor magnesium, potassium, and phosphorus BID for at least 3 days, MD to replete as needed, as pt is at risk for refeeding syndrome given severe malnutrition and poor PO x 6 days. Continue Folic acid, Thiamine, and Multivitamin w/ minerals daily  NUTRITION DIAGNOSIS:  Severe Malnutrition related to chronic illness as evidenced by severe muscle depletion, severe fat depletion.  GOAL:  Patient will meet greater than or equal to 90% of their needs  MONITOR:  PO intake, Diet advancement, Labs, Weight trends, TF tolerance  REASON FOR ASSESSMENT:  Consult Enteral/tube feeding initiation and management  ASSESSMENT:  72 y.o. male presented to the ED with fever, cough, and weakness. PMH includes HLD, HTN, facial spasm, EtOH abuse, and trigeminal neuralgia. Pt admitted with sepsis 2/2 pneumonia.    6/08 - Admitted; regular diet 6/11 - NPO; transferred to ICU 6/13 - diet advanced to dysphagia 1, honey thick liquids 6/14 - transferred to floor; Cortrak placed   RD received consult to start enteral nutrition. Reached out to CCM MD about replacing phosphorus prior to starting tube feeds. MD to order.  Discussed with RN that pt phos is low and would recommend holding tube feeds until level is above 2.   Pt laying in bed, no family at bedside. Cortrak in nare. Pt lethargic and slow to respond to RD questions. Pt does not respond to RD questions about PO intake. Does report weight loss with a UBW of 155#. Per EMR, pt with a 8% weight loss within the past 11  months. RD explained feeding tube and use to meet calorie and protein needs until pt is able to eat more by mouth.   Meal Intake 6/13-6/14: 50-70% x2 meals  Medications reviewed and include: Folic acid, MVI, Thiamine  Labs reviewed: Sodium 136, Potassium 4.2, Phosphorus 1.7, Magnesium 1.9  CBG: 98-133 x 24 hrs  NUTRITION - FOCUSED PHYSICAL EXAM: Flowsheet Row Most Recent Value  Orbital Region Moderate depletion  Upper Arm Region Severe depletion  Thoracic and Lumbar Region Severe depletion  Buccal Region Moderate depletion  Temple Region Moderate depletion  Clavicle Bone Region Severe depletion  Clavicle and Acromion Bone Region Severe depletion  Scapular Bone Region Severe depletion  Dorsal Hand Severe depletion  Patellar Region Severe depletion  Anterior Thigh Region Severe depletion  Posterior Calf Region Severe depletion  Edema (RD Assessment) None  Hair Reviewed  Eyes Reviewed  Mouth Unable to assess  Skin Reviewed  Nails Reviewed   Diet Order:   Diet Order             DIET - DYS 1 Room service appropriate? Yes with Assist; Fluid consistency: Honey Thick  Diet effective now                  EDUCATION NEEDS: Not appropriate for education at this time  Skin:  Skin Assessment: Reviewed RN Assessment  Last BM:  6/13  Height:  Ht Readings from Last 1 Encounters:  08/12/22 6\' 2"  (1.88 m)   Weight:  Wt Readings from  Last 1 Encounters:  08/16/22 66.5 kg   Ideal Body Weight:  86.4 kg  BMI:  Body mass index is 18.82 kg/m.  Estimated Nutritional Needs:  Kcal:  1900-2100 Protein:  95-115 grams Fluid:  >/= 1.9 L   Kirby Crigler RD, LDN Clinical Dietitian See [facility] for contact information.

## 2022-08-18 NOTE — Progress Notes (Signed)
Physical Therapy Treatment Patient Details Name: James Ibarra MRN: [id number] DOB: 1950/12/28 Today's Date: 08/18/2022   History of Present Illness 72 y/o M adm 6/8 from home with weakness, severe sepsis 2/2 multifocal PNA. 6/11 transfer to ICU with hypoxemia and agitation. PMH includes trigeminal neuralgia, facial spasm, HTN and HLD    PT Comments    Pt with flat affect, confused but able to follow single step commands. Pt SPO2 >94% on RA with frequent non-productive coughing. Pt able to progress to limited gait with impaired balance, transfers and function limited by cognitive deficits. Plan remains appropriate and will continue to follow.   HR 83 BP133/77 SPO2 96% RA    Recommendations for follow up therapy are one component of a multi-disciplinary discharge planning process, led by the attending physician.  Recommendations may be updated based on patient status, additional functional criteria and insurance authorization.  Follow Up Recommendations  Can patient physically be transported by private vehicle: No    Assistance Recommended at Discharge Frequent or constant Supervision/Assistance  Patient can return home with the following A lot of help with walking and/or transfers;A lot of help with bathing/dressing/bathroom;Assistance with cooking/housework;Direct supervision/assist for financial management;Assist for transportation;Assistance with feeding   Equipment Recommendations  Rolling walker (2 wheels)    Recommendations for Other Services       Precautions / Restrictions Precautions Precautions: Fall     Mobility  Bed Mobility Overal bed mobility: Needs Assistance Bed Mobility: Supine to Sit     Supine to sit: Min assist     General bed mobility comments: HOB flat with min assist to clear legs and trunk off surface, increased time    Transfers Overall transfer level: Needs assistance   Transfers: Sit to/from Stand Sit to Stand: Min assist            General transfer comment: min assist to rise from surface with cues for hand placement and safety    Ambulation/Gait Ambulation/Gait assistance: Min assist, +2 safety/equipment Gait Distance (Feet): 70 Feet Assistive device: Rolling walker (2 wheels) Gait Pattern/deviations: Step-to pattern, Wide base of support, Trunk flexed   Gait velocity interpretation: <1.8 ft/sec, indicate of risk for recurrent falls   General Gait Details: pt with difficulty with rt foot clearance, wide BOS and trunk flexed with max cues to step into RW and improve posture. Min assist to control RW and balance with close chair follow   Stairs             Wheelchair Mobility    Modified Rankin (Stroke Patients Only)       Balance Overall balance assessment: Needs assistance   Sitting balance-Leahy Scale: Fair Sitting balance - Comments: EOB with supervision   Standing balance support: Bilateral upper extremity supported, During functional activity, Reliant on assistive device for balance Standing balance-Leahy Scale: Poor Standing balance comment: RW in standing and min assist                            Cognition Arousal/Alertness: Awake/alert Behavior During Therapy: WFL for tasks assessed/performed Overall Cognitive Status: Impaired/Different from baseline Area of Impairment: Orientation, Following commands, Awareness, Attention, Memory, Safety/judgement                 Orientation Level: Disoriented to, Place, Time, Situation Current Attention Level: Focused Memory: Decreased short-term memory Following Commands: Follows one step commands inconsistently, Follows one step commands with increased time       General  Comments: pt aware he is in [place] but despite education could not recall hospital and continued to ask about situation. Needing max cues for sequence with mobility and safety for gait        Exercises      General Comments        Pertinent  Vitals/Pain Pain Assessment Pain Assessment: CPOT Facial Expression: Relaxed, neutral Body Movements: Absence of movements Muscle Tension: Relaxed Compliance with ventilator (intubated pts.): N/A Vocalization (extubated pts.): Talking in normal tone or no sound CPOT Total: 0    Home Living                          Prior Function            PT Goals (current goals can now be found in the care plan section) Progress towards PT goals: Progressing toward goals    Frequency    Min 1X/week      PT Plan Current plan remains appropriate    Co-evaluation              AM-PAC PT "6 Clicks" Mobility   Outcome Measure  Help needed turning from your back to your side while in a flat bed without using bedrails?: A Little Help needed moving from lying on your back to sitting on the side of a flat bed without using bedrails?: A Little Help needed moving to and from a bed to a chair (including a wheelchair)?: A Little Help needed standing up from a chair using your arms (e.g., wheelchair or bedside chair)?: A Lot Help needed to walk in hospital room?: A Lot Help needed climbing 3-5 steps with a railing? : Total 6 Click Score: 14    End of Session Equipment Utilized During Treatment: Gait belt Activity Tolerance: Patient tolerated treatment well Patient left: in chair;with call bell/phone within reach;with chair alarm set;with nursing/sitter in room Nurse Communication: Mobility status PT Visit Diagnosis: Other abnormalities of gait and mobility (R26.89);Difficulty in walking, not elsewhere classified (R26.2);Muscle weakness (generalized) (M62.81)     Time: 1610-9604 PT Time Calculation (min) (ACUTE ONLY): 23 min  Charges:  $Gait Training: 8-22 mins $Therapeutic Activity: 8-22 mins                     Merryl Hacker, PT Acute Rehabilitation Services Office: [phone]    Lemmie Steinhaus B Krystl Wickware 08/18/2022, 10:09 AM

## 2022-08-18 NOTE — TOC Benefit Eligibility Note (Signed)
Pharmacy Patient Advocate Encounter  Insurance verification completed.    The patient is insured through [facility] Medicare Part D  Ran test claim for Eliquis 5 mg and the current 30 day co-pay is $47.00.  Ran test claim for Xarelto 20 mg and the current 30 day co-pay is $47.00.  This test claim was processed through [facility] copay amounts may vary at other pharmacies due to pharmacy/plan contracts, or as the patient moves through the different stages of their insurance plan.    Roland Earl, CPHT Pharmacy Patient Advocate Specialist [facility] Patient Advocate Team Direct Number: [phone]  Fax: [phone]

## 2022-08-18 NOTE — Progress Notes (Signed)
NAME:  James Ibarra, MRN:  [id number], DOB:  Nov 26, 1950, LOS: 5 ADMISSION DATE:  08/12/2022, CONSULTATION DATE:  08/18/22 REFERRING MD:  Loney Loh, CHIEF COMPLAINT:  worsening respiratory status   History of Present Illness:  James Ibarra is a 72 y.o. M with PMH significant for Mitral regurgitation and prolapse, ETOH abuse, trigeminal neuralgia, HTN, HL who presented to the ED 6/8 with fever, cough and shortness of breath for three days.  In the ED, he was febrile and tachycardic and CXR showed R sided pneumonia. He was admitted to [facility] and treated with ceftriaxone and azithromycin.  During his hospital course he developed new onset atrial fibrillation and transiently required a cardizem gtt along with confusion.  Pt's wife reported liquor/ wine consumption daily.    On 6/11 pt's oxygen requirement increased rapidly from 5L [place] to 55 L HFNC, he was initially agitated and received Ativan 3mg  and Tussionex, CXR with worsening R-sided infiltrate and he became more somnolent.  PCCM consulted in the this context.    Pertinent  Medical History   has a past medical history of History of nonmelanoma skin cancer, Hypercholesteremia, Hypertension, and Trigeminal neuralgia.  Significant Hospital Events: Including procedures, antibiotic start and stop dates in addition to other pertinent events   6/8 admit to [facility] with PNA 6/11 worsening R-sided infiltrate, hypoxia and mental status, transfer to ICU on HFNC 6/12 phenobarb taper added, confused, IV cardizem  6/14 up walking in hallway  Interim History / Subjective:   Much more alert following commands walking in hallway this morning.  Objective   Blood pressure 120/80, pulse 82, temperature 99 F (37.2 C), temperature source Axillary, resp. rate (!) 44, height 6\' 2"  (1.88 m), weight 66.5 kg, SpO2 94 %.        Intake/Output Summary (Last 24 hours) at 08/18/2022 0842 Last data filed at 08/18/2022 0716 Gross per 24 hour  Intake 2122.23 ml  Output 1300  ml  Net 822.23 ml   Filed Weights   08/12/22 1809 08/16/22 0500  Weight: 71.9 kg 66.5 kg   General: Thin elderly gentleman med alert following commands, confused about his location HEENT: NCAT, tracks Neuro: Alert follows commands, alert to self and his location but otherwise confused at times CV: Regular rate rhythm, S1-S2, murmur positive PULM: Diminished breath sounds bilaterally no crackles no wheeze GI: Soft nontender nondistended Extremities: No significant edema, decreased muscle mass Skin: Rash on his back looks improved   Resolved Hospital Problem list    Assessment & Plan:   Acute Hypoxic Respiratory Failure secondary to R-sided pneumonia, +Rhinovirus, viral and probable bacterial component Plan: Continue to wean oxygen to maintain sats above 90% Complete course of antibiotics, he is clinically improved he is already completed with stop date. Continue CPT, I-S flutter, mobilization to help with secretion management.  Acute Metabolic Encephalopathy ETOH withdrawal Continue phenobarbital taper As needed Ativan Thiamine folate multivitamin Continue supportive care Delirium precautions He has alcohol abuse at baseline.  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 Lopressor every 6 hours while NPO. I suspect will need core track placement.  At risk for malnutrition Orders placed for core track Started tube feeds   Mild Transaminitis  - slightly up today, trend LFTs, consider RUQ [place]  Trigeminal neuralgia  - cont to hold gabapentin 600mg  QID, while NPO  Best Practice (right click and "Reselect all SmartList Selections" daily)   Diet/type: NPO DVT  prophylaxis: SCD GI prophylaxis: N/A Lines: N/A Foley:  N/A Code Status:  full code Last date of multidisciplinary goals of care discussion [wife updated yesterday at bedside]  Labs   CBC: Recent Labs  Lab  08/12/22 1445 08/13/22 0251 08/14/22 0339 08/15/22 0232 08/16/22 0412 08/17/22 0024 08/18/22 0020  WBC 15.0*   < > 13.8* 9.9 10.6* 12.2* 14.8*  NEUTROABS 14.3*  --   --   --  8.1*  --   --   HGB 13.5   < > 12.0* 11.5* 12.2* 11.5* 11.8*  HCT 40.6   < > 35.6* 33.0* 35.9* 33.9* 35.6*  MCV 96.0   < > 93.9 92.4 90.2 90.4 90.4  PLT 214   < > 197 215 254 313 372   < > = values in this interval not displayed.    Basic Metabolic Panel: Recent Labs  Lab 08/14/22 0339 08/15/22 0232 08/16/22 0412 08/17/22 0024 08/18/22 0016 08/18/22 0652  NA 132* 134* 137 135 136 136  K 3.5 3.9 3.6 3.2* 3.3* 4.2  CL 96* 104 105 102 104 103  CO2 24 21* 20* 21* 21* 21*  GLUCOSE 101* 97 114* 100* 110* 99  BUN 9 10 14 15 14 12   CREATININE 0.72 0.57* 0.72 0.66 0.60* 0.61  CALCIUM 8.3* 7.8* 8.0* 8.0* 8.1* 8.2*  MG 1.8 2.2 2.2 2.1 1.8 2.0  PHOS 2.0* 2.9  --  2.5 2.3*  --    GFR: Estimated Creatinine Clearance: 79.7 mL/min (by C-G formula based on SCr of 0.61 mg/dL). Recent Labs  Lab 08/12/22 1445 08/12/22 1815 08/13/22 0251 08/14/22 0339 08/15/22 0232 08/15/22 0611 08/16/22 0412 08/17/22 0024 08/18/22 0020  PROCALCITON  --  1.16   < > 0.95 0.53 0.42  --  0.21  --   WBC 15.0*  --    < > 13.8* 9.9  --  10.6* 12.2* 14.8*  LATICACIDVEN 4.6* 1.6  --   --  0.9  --   --   --   --    < > = values in this interval not displayed.    Liver Function Tests: Recent Labs  Lab 08/13/22 0251 08/14/22 0339 08/15/22 0232 08/17/22 0024 08/18/22 0016  AST 60* 61* 72* 102* 119*  ALT 23 31 45* 90* 106*  ALKPHOS 52 76 54 48 55  BILITOT 0.8 1.3* 0.9 0.7 0.8  PROT 5.0* 5.9* 5.2* 5.5* 5.8*  ALBUMIN 2.3* 2.5* 2.2* 2.2* 2.2*   No results for input(s): "LIPASE", "AMYLASE" in the last 168 hours. No results for input(s): "AMMONIA" in the last 168 hours.  ABG    Component Value Date/Time   PHART 7.48 (H) 08/15/2022 0212   PCO2ART 32 08/15/2022 0212   PO2ART 216 (H) 08/15/2022 0212   HCO3 22.1 08/15/2022  0909   ACIDBASEDEF 0.2 08/15/2022 0909   O2SAT 92.1 08/15/2022 0909     Coagulation Profile: Recent Labs  Lab 08/13/22 0251  INR 1.3*    Cardiac Enzymes: Recent Labs  Lab 08/14/22 0339  CKTOTAL 928*    HbA1C: No results found for: "HGBA1C"  CBG: Recent Labs  Lab 08/17/22 1639 08/17/22 2102 08/17/22 2303 08/18/22 0306 08/18/22 0825  GLUCAP 107* 115* 133* 98 124*         Josephine Igo, DO [facility] 08/18/2022 9:47 AM

## 2022-08-19 DIAGNOSIS — A419 Sepsis, unspecified organism: Secondary | ICD-10-CM | POA: Diagnosis not present

## 2022-08-19 DIAGNOSIS — R6520 Severe sepsis without septic shock: Secondary | ICD-10-CM | POA: Diagnosis not present

## 2022-08-19 DIAGNOSIS — F10939 Alcohol use, unspecified with withdrawal, unspecified: Secondary | ICD-10-CM | POA: Diagnosis not present

## 2022-08-19 DIAGNOSIS — G9341 Metabolic encephalopathy: Secondary | ICD-10-CM | POA: Diagnosis not present

## 2022-08-19 DIAGNOSIS — E43 Unspecified severe protein-calorie malnutrition: Secondary | ICD-10-CM | POA: Insufficient documentation

## 2022-08-19 LAB — BASIC METABOLIC PANEL
Anion gap: 12 (ref 5–15)
BUN: 8 mg/dL (ref 8–23)
CO2: 23 mmol/L (ref 22–32)
Calcium: 8.4 mg/dL — ABNORMAL LOW (ref 8.9–10.3)
Chloride: 101 mmol/L (ref 98–111)
Creatinine, Ser: 0.58 mg/dL — ABNORMAL LOW (ref 0.61–1.24)
GFR, Estimated: >60 mL/min (ref >60)
Glucose, Bld: 98 mg/dL (ref 70–99)
Potassium: 3.2 mmol/L — ABNORMAL LOW (ref 3.5–5.1)
Sodium: 136 mmol/L (ref 135–145)

## 2022-08-19 LAB — GLUCOSE, CAPILLARY
Glucose-Capillary: 104 mg/dL — ABNORMAL HIGH (ref 70–99)
Glucose-Capillary: 100 mg/dL — ABNORMAL HIGH (ref 70–99)
Glucose-Capillary: 116 mg/dL — ABNORMAL HIGH (ref 70–99)
Glucose-Capillary: 124 mg/dL — ABNORMAL HIGH (ref 70–99)
Glucose-Capillary: 117 mg/dL — ABNORMAL HIGH (ref 70–99)

## 2022-08-19 LAB — MAGNESIUM
Magnesium: 1.9 mg/dL (ref 1.7–2.4)
Magnesium: 2 mg/dL (ref 1.7–2.4)

## 2022-08-19 LAB — PHOSPHORUS
Phosphorus: 3.5 mg/dL (ref 2.5–4.6)
Phosphorus: 3.5 mg/dL (ref 2.5–4.6)

## 2022-08-19 MED ORDER — PHENOBARBITAL SODIUM 130 MG/ML IJ SOLN
65.0000 mg | Freq: Three times a day (TID) | INTRAMUSCULAR | Status: AC
  Administered 2022-08-19 – 2022-08-20 (×4): 65 mg via INTRAVENOUS
  Filled 2022-08-19 (×4): qty 1

## 2022-08-19 MED ORDER — DILTIAZEM HCL 30 MG PO TABS: 30.0000 mg | ORAL_TABLET | Freq: Four times a day (QID) | ORAL | Status: DC

## 2022-08-19 MED ORDER — K PHOS MONO-SOD PHOS DI & MONO 155-852-130 MG PO TABS
500.0000 mg | ORAL_TABLET | Freq: Two times a day (BID) | ORAL | Status: DC
  Administered 2022-08-19 – 2022-08-20 (×3): 500 mg via ORAL
  Filled 2022-08-19 (×3): qty 2

## 2022-08-19 MED ORDER — GUAIFENESIN-DM 100-10 MG/5ML PO SYRP: 5.0000 mL | ORAL_SOLUTION | ORAL | Status: DC | PRN

## 2022-08-19 MED ORDER — METOPROLOL TARTRATE 12.5 MG HALF TABLET
12.5000 mg | ORAL_TABLET | Freq: Two times a day (BID) | ORAL | Status: DC
  Administered 2022-08-19 (×2): 12.5 mg
  Filled 2022-08-19 (×2): qty 1

## 2022-08-19 MED ORDER — DILTIAZEM HCL 25 MG/5ML IV SOLN
10.0000 mg | Freq: Once | INTRAVENOUS | Status: DC
  Filled 2022-08-19: qty 5

## 2022-08-19 MED ORDER — IPRATROPIUM-ALBUTEROL 0.5-2.5 (3) MG/3ML IN SOLN: 3.0000 mL | Freq: Four times a day (QID) | RESPIRATORY_TRACT | Status: DC | PRN

## 2022-08-19 MED ORDER — PHENOBARBITAL SODIUM 65 MG/ML IJ SOLN
32.5000 mg | Freq: Three times a day (TID) | INTRAMUSCULAR | Status: AC
  Administered 2022-08-20 – 2022-08-22 (×6): 32.5 mg via INTRAVENOUS
  Filled 2022-08-19 (×5): qty 1

## 2022-08-19 MED ORDER — METOPROLOL TARTRATE 5 MG/5ML IV SOLN: 5.0000 mg | INTRAVENOUS | Status: DC | PRN

## 2022-08-19 NOTE — Progress Notes (Signed)
PROGRESS NOTE    James Ibarra  GNF:[id number] DOB: November 27, 1950 DOA: 08/12/2022 PCP: Farris Has, MD  Chief Complaint  Patient presents with   Fever    Brief Narrative:   James Ibarra is a 72 y.o. M with PMH significant for Mitral regurgitation and prolapse, ETOH abuse, trigeminal neuralgia, HTN, HL who presented to the ED 6/8 with fever, cough and shortness of breath for three days.  In the ED, he was febrile and tachycardic and CXR showed R sided pneumonia. He was admitted to [facility] and treated with ceftriaxone and azithromycin.  During his hospital course he developed new onset atrial fibrillation and transiently required a cardizem gtt along with confusion.  Pt's wife reported liquor/ wine consumption daily.     On 6/11 pt's oxygen requirement increased rapidly from 5L [place] to 55 L HFNC, he was initially agitated and received Ativan 3mg  and Tussionex, CXR with worsening R-sided infiltrate and he became more somnolent.  PCCM consulted and patient was transferred to ICU.  6/8 admit to [facility] with PNA 6/11 worsening R-sided infiltrate, hypoxia and mental status, transfer to ICU on HFNC 6/12 phenobarb taper added, confused, IV cardizem  6/14 up walking in hallway, transferred to Progressive care.   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 -Continue CPT, I-S flutter, mobilization to help with secretion management.    Acute Metabolic Encephalopathy Alcohol withdrawal with severe DTs - Continue phenobarbital taper -Continue with as needed CIWA Ativan protocol as well - Thiamine folate multivitamin - Continue supportive care - Delirium precautions - He has alcohol abuse at baseline. -Passed swallow evaluation, on dysphagia 1 diet, but with poor oral intake, continue with tube feed.   Paroxysmal Atrial Fibrillation HTN 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 HL, HTN -A-fib appears to be new diagnosis  -Initial Cardizem drip, discontinued, continue with metoprolol p.o., continue with as needed IV metoprolol.  -Patient apparently has an outpatient appointment scheduled with Dr. Antoine Poche for 08/24/2022   Severe protein calorie malnutrition Hypophosphatemia Hypokalemia -started on tube feed, high risk for refeeding syndrome, replete potassium and phosphorus. -He is on dysphagia 1 diet, but poor oral intake, continue with tube feed  Mild Transaminitis  -Most likely due to alcohol abuse, trending down.   Trigeminal neuralgia  - cont to hold gabapentin 600mg  QID, has significantly encephalopathic  DVT prophylaxis: Lovenox Code Status: Full Family Communication: none at bedside Disposition:   Status is: Inpatient    Consultants:  PCCM   Subjective:  No significant events overnight, he has core Trak tube  Objective: Vitals:   08/18/22 2022 08/19/22 0000 08/19/22 0400 08/19/22 0732  BP: (!) 151/86 (!) 147/89 (!) 147/95 (!) 141/84  Pulse: 79 74    Resp: (!) 30 (!) 27 (!) 21 20  Temp: 97.6 F (36.4 C) 98 F (36.7 C) 97.8 F (36.6 C)   TempSrc: Oral Oral Oral Oral  SpO2: 100%     Weight:  Height:        Intake/Output Summary (Last 24 hours) at 08/19/2022 1043 Last data filed at 08/19/2022 0816 Gross per 24 hour  Intake 516.28 ml  Output 100 ml  Net 416.28 ml   Filed Weights   08/12/22 1809 08/16/22 0500  Weight: 71.9 kg 66.5 kg    Examination:  Awake, open eyes, significantly confused, does  not follow any commands or answer any question, cachectic, deconditioned having core Trak tube Symmetrical Chest wall movement, Good air movement bilaterally, CTAB RRR,No Gallops,Rubs or new Murmurs, No Parasternal Heave +ve B.Sounds, Abd Soft, No tenderness, No rebound - guarding or rigidity. No Cyanosis, Clubbing or edema, No new Rash or bruise       Data Reviewed: I have personally reviewed following labs and imaging studies  CBC: Recent Labs  Lab 08/12/22 1445 08/13/22 0251 08/14/22 0339 08/15/22 0232 08/16/22 0412 08/17/22 0024 08/18/22 0020  WBC 15.0*   < > 13.8* 9.9 10.6* 12.2* 14.8*  NEUTROABS 14.3*  --   --   --  8.1*  --   --   HGB 13.5   < > 12.0* 11.5* 12.2* 11.5* 11.8*  HCT 40.6   < > 35.6* 33.0* 35.9* 33.9* 35.6*  MCV 96.0   < > 93.9 92.4 90.2 90.4 90.4  PLT 214   < > 197 215 254 313 372   < > = values in this interval not displayed.    Basic Metabolic Panel: Recent Labs  Lab 08/16/22 0412 08/17/22 0024 08/18/22 0016 08/18/22 0652 08/18/22 1006 08/18/22 1713 08/19/22 0553  NA 137 135 136 136  --   --  136  K 3.6 3.2* 3.3* 4.2  --   --  3.2*  CL 105 102 104 103  --   --  101  CO2 20* 21* 21* 21*  --   --  23  GLUCOSE 114* 100* 110* 99  --   --  98  BUN 14 15 14 12   --   --  8  CREATININE 0.72 0.66 0.60* 0.61  --   --  0.58*  CALCIUM 8.0* 8.0* 8.1* 8.2*  --   --  8.4*  MG 2.2 2.1 1.8 2.0 1.9 2.0 1.9  PHOS  --  2.5 2.3*  --  1.7* 2.5 3.5    GFR: Estimated Creatinine Clearance: 79.7 mL/min (A) (by C-G formula based on SCr of 0.58 mg/dL (L)).  Liver Function Tests: Recent Labs  Lab 08/13/22 0251 08/14/22 0339 08/15/22 0232 08/17/22 0024 08/18/22 0016  AST 60* 61* 72* 102* 119*  ALT 23 31 45* 90* 106*  ALKPHOS 52 76 54 48 55  BILITOT 0.8 1.3* 0.9 0.7 0.8  PROT 5.0* 5.9* 5.2* 5.5* 5.8*  ALBUMIN 2.3* 2.5* 2.2* 2.2* 2.2*    CBG: Recent Labs  Lab 08/18/22 1523 08/18/22 2027 08/18/22 2309 08/19/22 0313 08/19/22 0737  GLUCAP 114* 96  101* 104* 100*     Recent Results (from the past 240 hour(s))  Resp panel by RT-PCR (RSV, Flu A&B, Covid) Anterior Nasal Swab     Status: None   Collection Time: 08/12/22  2:30 PM   Specimen: Anterior Nasal Swab  Result Value Ref Range Status   SARS Coronavirus 2 by RT PCR NEGATIVE NEGATIVE Final   Influenza A by PCR NEGATIVE NEGATIVE Final   Influenza B by PCR NEGATIVE NEGATIVE Final    Comment: (NOTE) The Xpert Xpress SARS-CoV-2/FLU/RSV plus assay is intended as an aid in the diagnosis of  influenza from Nasopharyngeal swab specimens and should not be used as a sole basis for treatment. Nasal washings and aspirates are unacceptable for Xpert Xpress SARS-CoV-2/FLU/RSV testing.  Fact Sheet for Patients: BloggerCourse.com  Fact Sheet for Healthcare Providers: SeriousBroker.it  This test is not yet approved or cleared by the [place] FDA and has been authorized for detection and/or diagnosis of SARS-CoV-2 by FDA under an Emergency Use Authorization (EUA). This EUA will remain in effect (meaning this test can be used) for the duration of the COVID-19 declaration under Section 564(b)(1) of the Act, 21 U.S.C. section 360bbb-3(b)(1), unless the authorization is terminated or revoked.     Resp Syncytial Virus by PCR NEGATIVE NEGATIVE Final    Comment: (NOTE) Fact Sheet for Patients: BloggerCourse.com  Fact Sheet for Healthcare Providers: SeriousBroker.it  This test is not yet approved or cleared by the [place] FDA and has been authorized for detection and/or diagnosis of SARS-CoV-2 by FDA under an Emergency Use Authorization (EUA). This EUA will remain in effect (meaning this test can be used) for the duration of the COVID-19 declaration under Section 564(b)(1) of the Act, 21 U.S.C. section 360bbb-3(b)(1), unless the authorization is terminated or revoked.  Performed  at [facility], [address], [place], [place] [address]   Blood Culture (routine x 2)     Status: None   Collection Time: 08/12/22  3:10 PM   Specimen: BLOOD  Result Value Ref Range Status   Specimen Description BLOOD LEFT ANTECUBITAL  Final   Special Requests   Final    BOTTLES DRAWN AEROBIC AND ANAEROBIC Blood Culture results may not be optimal due to an excessive volume of blood received in culture bottles   Culture   Final    NO GROWTH 5 DAYS Performed at [facility], [address] [place], [place] [address]    Report Status 08/17/2022 FINAL  Final  Blood Culture (routine x 2)     Status: None   Collection Time: 08/12/22  3:20 PM   Specimen: BLOOD RIGHT FOREARM  Result Value Ref Range Status   Specimen Description BLOOD RIGHT FOREARM  Final   Special Requests   Final    BOTTLES DRAWN AEROBIC AND ANAEROBIC Blood Culture adequate volume   Culture   Final    NO GROWTH 5 DAYS Performed at [facility], [address] [place], [place] [address]    Report Status 08/17/2022 FINAL  Final  MRSA Next Gen by PCR, Nasal     Status: None   Collection Time: 08/15/22  2:11 AM   Specimen: Nasal Mucosa; Nasal Swab  Result Value Ref Range Status   MRSA by PCR Next Gen NOT DETECTED NOT DETECTED Final    Comment: (NOTE) The GeneXpert MRSA Assay (FDA approved for NASAL specimens only), is one component of a comprehensive MRSA colonization surveillance program. It is not intended to diagnose MRSA infection nor to guide or monitor treatment for MRSA infections. Test performance is not FDA approved in patients less than 28 years old. Performed at [facility], [address], [place], [place] [address]   Respiratory (~20 pathogens) panel by PCR     Status: Abnormal   Collection Time: 08/15/22  6:00 AM   Specimen: Nasopharyngeal Swab; Respiratory  Result Value Ref Range Status   Adenovirus NOT DETECTED NOT DETECTED Final   Coronavirus 229E NOT DETECTED NOT  DETECTED Final    Comment: (NOTE) The Coronavirus on the Respiratory Panel,  DOES NOT test for the novel  Coronavirus (2019 nCoV)    Coronavirus HKU1 NOT DETECTED NOT DETECTED Final   Coronavirus NL63 NOT DETECTED NOT DETECTED Final   Coronavirus OC43 NOT DETECTED NOT DETECTED Final   Metapneumovirus NOT DETECTED NOT DETECTED Final   Rhinovirus / Enterovirus DETECTED (A) NOT DETECTED Final   Influenza A NOT DETECTED NOT DETECTED Final   Influenza B NOT DETECTED NOT DETECTED Final   Parainfluenza Virus 1 NOT DETECTED NOT DETECTED Final   Parainfluenza Virus 2 NOT DETECTED NOT DETECTED Final   Parainfluenza Virus 3 NOT DETECTED NOT DETECTED Final   Parainfluenza Virus 4 NOT DETECTED NOT DETECTED Final   Respiratory Syncytial Virus NOT DETECTED NOT DETECTED Final   Bordetella pertussis NOT DETECTED NOT DETECTED Final   Bordetella Parapertussis NOT DETECTED NOT DETECTED Final   Chlamydophila pneumoniae NOT DETECTED NOT DETECTED Final   Mycoplasma pneumoniae NOT DETECTED NOT DETECTED Final    Comment: Performed at [facility], [address], [place], [place] [address]         Radiology Studies: DG Abd Portable 1V  Result Date: 08/18/2022 CLINICAL DATA:  Evaluate feeding tube placement EXAM: PORTABLE ABDOMEN - 1 VIEW COMPARISON:  None Available. FINDINGS: The feeding tube terminates in the lower abdomen to the left of midline, likely within the distal aspect of a J-shaped or distended stomach. IMPRESSION: The feeding tube terminates in the lower abdomen to the left of midline, likely within the distal aspect of a J-shaped or distended stomach. Electronically Signed   By: Gerome Sam III M.D.   On: 08/18/2022 18:20   DG CHEST PORT 1 VIEW  Result Date: 08/18/2022 CLINICAL DATA:  Viral pneumonia EXAM: PORTABLE CHEST 1 VIEW COMPARISON:  Chest radiograph 08/18/2022 FINDINGS: The cardiomediastinal silhouette is stable. Aeration of the right lung has improved but with residual  confluent opacities in the right upper and lower lobes. The left lung remains clear. There is no new or worsening focal airspace disease. There is no significant pleural effusion. There is no pneumothorax There is no acute osseous abnormality. IMPRESSION: Improved aeration of the right lung but with persistent confluent opacities in the right upper and lower lobes. Electronically Signed   By: Lesia Hausen M.D.   On: 08/18/2022 08:22        Scheduled Meds:  Chlorhexidine Gluconate Cloth  6 each Topical Daily   Chlorhexidine Gluconate Cloth  6 each Topical Daily   enoxaparin (LOVENOX) injection  40 mg Subcutaneous Q24H   feeding supplement (PROSource TF20)  60 mL Per Tube Daily   folic acid  1 mg Intravenous Daily   free water  150 mL Per Tube Q4H   metoprolol tartrate  5 mg Intravenous Q6H   multivitamin with minerals  1 tablet Oral Daily   PHENObarbital  65 mg Intravenous Q8H   Followed by   Melene Muller ON 08/20/2022] PHENObarbital  32.5 mg Intravenous Q8H   phosphorus  500 mg Oral TID   sodium chloride flush  3 mL Intravenous Q12H   thiamine  100 mg Intravenous Daily   Continuous Infusions:  sodium chloride Stopped (08/17/22 1434)   diltiazem (CARDIZEM) infusion 5 mg/hr (08/18/22 2346)   feeding supplement (OSMOLITE 1.5 CAL)       LOS: 6 days      Huey Bienenstock, MD [facility]   To contact the attending provider between 7A-7P or the covering provider during after hours 7P-7A, please log into the web site [email or web address] and access  using universal [place] password for that web site. If you do not have the password, please call the hospital operator.  08/19/2022, 10:43 AM
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
## 2022-08-20 DIAGNOSIS — R6520 Severe sepsis without septic shock: Secondary | ICD-10-CM | POA: Diagnosis not present

## 2022-08-20 DIAGNOSIS — A419 Sepsis, unspecified organism: Secondary | ICD-10-CM | POA: Diagnosis not present

## 2022-08-20 DIAGNOSIS — F10939 Alcohol use, unspecified with withdrawal, unspecified: Secondary | ICD-10-CM | POA: Diagnosis not present

## 2022-08-20 DIAGNOSIS — G9341 Metabolic encephalopathy: Secondary | ICD-10-CM | POA: Diagnosis not present

## 2022-08-20 LAB — GLUCOSE, CAPILLARY
Glucose-Capillary: 101 mg/dL — ABNORMAL HIGH (ref 70–99)
Glucose-Capillary: 121 mg/dL — ABNORMAL HIGH (ref 70–99)
Glucose-Capillary: 113 mg/dL — ABNORMAL HIGH (ref 70–99)
Glucose-Capillary: 120 mg/dL — ABNORMAL HIGH (ref 70–99)
Glucose-Capillary: 120 mg/dL — ABNORMAL HIGH (ref 70–99)

## 2022-08-20 LAB — COMPREHENSIVE METABOLIC PANEL
ALT: 105 U/L — ABNORMAL HIGH (ref 0–44)
AST: 75 U/L — ABNORMAL HIGH (ref 15–41)
Albumin: 2.4 g/dL — ABNORMAL LOW (ref 3.5–5.0)
Alkaline Phosphatase: 55 U/L (ref 38–126)
Anion gap: 10 (ref 5–15)
BUN: 10 mg/dL (ref 8–23)
CO2: 24 mmol/L (ref 22–32)
Calcium: 8.4 mg/dL — ABNORMAL LOW (ref 8.9–10.3)
Chloride: 102 mmol/L (ref 98–111)
Creatinine, Ser: 0.57 mg/dL — ABNORMAL LOW (ref 0.61–1.24)
GFR, Estimated: >60 mL/min (ref >60)
Glucose, Bld: 110 mg/dL — ABNORMAL HIGH (ref 70–99)
Potassium: 2.7 mmol/L — CL (ref 3.5–5.1)
Sodium: 136 mmol/L (ref 135–145)
Total Bilirubin: 0.6 mg/dL (ref 0.3–1.2)
Total Protein: 6.1 g/dL — ABNORMAL LOW (ref 6.5–8.1)

## 2022-08-20 LAB — POTASSIUM: Potassium: 4.2 mmol/L (ref 3.5–5.1)

## 2022-08-20 LAB — CBC
HCT: 35.7 % — ABNORMAL LOW (ref 39.0–52.0)
Hemoglobin: 12.2 g/dL — ABNORMAL LOW (ref 13.0–17.0)
MCH: 31.1 pg (ref 26.0–34.0)
MCHC: 34.2 g/dL (ref 30.0–36.0)
MCV: 91.1 fL (ref 80.0–100.0)
Platelets: 448 10*3/uL — ABNORMAL HIGH (ref 150–400)
RBC: 3.92 MIL/uL — ABNORMAL LOW (ref 4.22–5.81)
RDW: 13.3 % (ref 11.5–15.5)
WBC: 10.3 10*3/uL (ref 4.0–10.5)
nRBC: 0 % (ref 0.0–0.2)

## 2022-08-20 LAB — MAGNESIUM: Magnesium: 2 mg/dL (ref 1.7–2.4)

## 2022-08-20 LAB — PHOSPHORUS: Phosphorus: 3.6 mg/dL (ref 2.5–4.6)

## 2022-08-20 MED ORDER — APIXABAN 5 MG PO TABS
5.0000 mg | ORAL_TABLET | Freq: Two times a day (BID) | ORAL | Status: DC
  Administered 2022-08-20 – 2022-08-22 (×5): 5 mg via ORAL
  Filled 2022-08-20 (×5): qty 1

## 2022-08-20 MED ORDER — METOPROLOL TARTRATE 25 MG PO TABS
25.0000 mg | ORAL_TABLET | Freq: Two times a day (BID) | ORAL | Status: DC
  Administered 2022-08-20 – 2022-08-21 (×4): 25 mg
  Filled 2022-08-20 (×4): qty 1

## 2022-08-20 MED ORDER — LORAZEPAM 2 MG/ML IJ SOLN
1.0000 mg | INTRAMUSCULAR | Status: DC | PRN
  Administered 2022-08-20 – 2022-08-21 (×2): 2 mg via INTRAVENOUS
  Administered 2022-08-21 (×2): 1 mg via INTRAVENOUS
  Filled 2022-08-20 (×2): qty 1

## 2022-08-20 MED ORDER — LORAZEPAM 1 MG PO TABS
1.0000 mg | ORAL_TABLET | ORAL | Status: DC | PRN
  Administered 2022-08-20: 1 mg via ORAL
  Administered 2022-08-21: 2 mg via ORAL
  Administered 2022-08-22: 3 mg via ORAL
  Filled 2022-08-20 (×2): qty 2
  Filled 2022-08-20: qty 3

## 2022-08-20 MED ORDER — K PHOS MONO-SOD PHOS DI & MONO 155-852-130 MG PO TABS
250.0000 mg | ORAL_TABLET | Freq: Every day | ORAL | Status: AC
  Administered 2022-08-21 – 2022-08-22 (×2): 250 mg via ORAL
  Filled 2022-08-20 (×2): qty 1

## 2022-08-20 MED ORDER — POTASSIUM CHLORIDE 10 MEQ/100ML IV SOLN
10.0000 meq | INTRAVENOUS | Status: AC
  Administered 2022-08-20 (×3): 10 meq via INTRAVENOUS
  Filled 2022-08-20 (×3): qty 100

## 2022-08-20 MED ORDER — POTASSIUM CHLORIDE 20 MEQ PO PACK
60.0000 meq | PACK | ORAL | Status: AC
  Administered 2022-08-20 (×2): 60 meq via ORAL
  Filled 2022-08-20 (×2): qty 3

## 2022-08-20 MED ORDER — POTASSIUM CHLORIDE 20 MEQ PO PACK
40.0000 meq | PACK | Freq: Three times a day (TID) | ORAL | Status: DC
  Administered 2022-08-20: 40 meq
  Filled 2022-08-20: qty 2

## 2022-08-20 NOTE — Progress Notes (Addendum)
ANTICOAGULATION CONSULT NOTE - Initial Consult  Pharmacy Consult for Eliquis Indication: atrial fibrillation  No Known Allergies  Patient Measurements: Height: 6\' 2"  (188 cm) Weight: 66.5 kg (146 lb 9.7 oz) IBW/kg (Calculated) : 82.2  Vital Signs: Temp: 98.4 F (36.9 C) (06/16 0800) Temp Source: Axillary (06/16 0800) BP: 141/85 (06/16 0800) Pulse Rate: 82 (06/16 0800)  Labs: Recent Labs    08/18/22 0020 08/18/22 0652 08/19/22 0553 08/20/22 0319  HGB 11.8*  --   --  12.2*  HCT 35.6*  --   --  35.7*  PLT 372  --   --  448*  CREATININE  --  0.61 0.58* 0.57*    Estimated Creatinine Clearance: 79.7 mL/min (A) (by C-G formula based on SCr of 0.57 mg/dL (L)).   Medical History: Past Medical History:  Diagnosis Date   History of nonmelanoma skin cancer    Hypercholesteremia    Hypertension    Trigeminal neuralgia     Medications:  Medications Prior to Admission  Medication Sig Dispense Refill Last Dose   albuterol (VENTOLIN HFA) 108 (90 Base) MCG/ACT inhaler Inhale 1 puff into the lungs every 6 (six) hours as needed for wheezing or shortness of breath.   08/11/2022   amLODipine (NORVASC) 5 MG tablet Take 5 mg by mouth daily before breakfast.    08/11/2022   azithromycin (ZITHROMAX) 250 MG tablet Take 250 mg by mouth 2 (two) times daily.   08/11/2022   gabapentin (NEURONTIN) 300 MG capsule Take 2 capsules by mouth four times a day. 720 capsule 3 08/11/2022   HYDROcodone bit-homatropine (HYCODAN) 5-1.5 MG/5ML syrup Take 5 mLs by mouth every 4 (four) hours as needed for cough.   08/11/2022   ketoconazole (NIZORAL) 2 % cream Apply 1 Application topically as needed for irritation.   Past Week   melatonin 3 MG TABS tablet Take 3 mg by mouth at bedtime as needed.   unk   methylPREDNISolone (MEDROL DOSEPAK) 4 MG TBPK tablet Take by mouth. 6 day taper   08/11/2022   Multiple Vitamins-Minerals (ICAPS AREDS 2 PO) Take 1 capsule by mouth in the morning and at bedtime.   08/11/2022    olmesartan-hydrochlorothiazide (BENICAR HCT) 40-25 MG per tablet Take 1 tablet by mouth daily before breakfast.    08/11/2022   potassium chloride SA (KLOR-CON M) 20 MEQ tablet Take 20 mEq by mouth 2 (two) times daily.   08/11/2022   pravastatin (PRAVACHOL) 10 MG tablet Take 10 mg by mouth at bedtime.   08/11/2022   sildenafil (REVATIO) 20 MG tablet Take 2-5 tablets for ED orally once a day if needed   unk   traZODone (DESYREL) 100 MG tablet Take 50-100 mg by mouth at bedtime as needed.    unk   Scheduled:   Chlorhexidine Gluconate Cloth  6 each Topical Daily   Chlorhexidine Gluconate Cloth  6 each Topical Daily   feeding supplement (PROSource TF20)  60 mL Per Tube Daily   folic acid  1 mg Intravenous Daily   free water  150 mL Per Tube Q4H   metoprolol tartrate  25 mg Per Tube BID   multivitamin with minerals  1 tablet Oral Daily   PHENObarbital  32.5 mg Intravenous Q8H   [START ON 08/21/2022] phosphorus  250 mg Oral Daily   potassium chloride  60 mEq Oral Q4H   sodium chloride flush  3 mL Intravenous Q12H   thiamine  100 mg Intravenous Daily   Assessment: 52 yoM with PMH  for mitral regurgitation and prolapse, ETOH use, trigeminal neuralgia, HTN, HLD admitted to [facility] for CAP. During hospital course, he developed new onset atrial fibrillation. Pharmacy has been consulted to dose Eliquis for atrial fibrillation. Hgb 12.2, plts 448, Wt 66.5 kg, and sCr 0.57 (bl ~0.5-0.7). Price was checked and $47/month. Patient was not on therapeutic anti-coagulation during hospital stay for afib prior to Eliquis due to staying in NSR with no re-occurrence until now.   Drug interaction with phenobarbital taper for AWS. PHB will reduce Eliquis levels by ~50% making it less efficacious. And given the half life of PHB, will most likely stay in his sytem for ~1 week. Discussed with provider and would like to continue Eliquis for the being. Recommended stopping the PHB today given the interaction and would like to proceed  with the PHB.  Goal of Therapy:  Monitor platelets by anticoagulation protocol: Yes   Plan:  Start Eliquis 5mg  PO BID Monitor for signs/symptoms of bleeding Follow up patient education   Thanks,  Arabella Merles, PharmD. [facility] PGY-1 08/20/2022 10:21 AM

## 2022-08-20 NOTE — Progress Notes (Signed)
PROGRESS NOTE    James Ibarra  ZOX:[id number] DOB: 11-19-1950 DOA: 08/12/2022 PCP: Farris Has, MD  Chief Complaint  Patient presents with   Fever    Brief Narrative:   James Ibarra is a 72 y.o. M with PMH significant for Mitral regurgitation and prolapse, ETOH abuse, trigeminal neuralgia, HTN, HL who presented to the ED 6/8 with fever, cough and shortness of breath for three days.  In the ED, he was febrile and tachycardic and CXR showed R sided pneumonia. He was admitted to [facility] and treated with ceftriaxone and azithromycin.  During his hospital course he developed new onset atrial fibrillation and transiently required a cardizem gtt along with confusion.  Pt's wife reported liquor/ wine consumption daily.     On 6/11 pt's oxygen requirement increased rapidly from 5L [place] to 55 L HFNC, he was initially agitated and received Ativan 3mg  and Tussionex, CXR with worsening R-sided infiltrate and he became more somnolent.  PCCM consulted and patient was transferred to ICU.  6/8 admit to [facility] with PNA 6/11 worsening R-sided infiltrate, hypoxia and mental status, transfer to ICU on HFNC 6/12 phenobarb taper added, confused, IV cardizem  6/14 up walking in hallway, transferred to Progressive care.   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 -Continue CPT, I-S flutter, mobilization  to help with secretion management.   Acute Metabolic Encephalopathy Alcohol withdrawal with severe DTs - Continue phenobarbital taper -Continue with as needed CIWA Ativan protocol as well - Thiamine folate multivitamin - Continue supportive care - Delirium precautions - He has alcohol abuse at baseline. -Passed swallow evaluation, on dysphagia 1 diet, but with poor oral intake, continue with tube feed.   Paroxysmal Atrial Fibrillation HTN 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 HL, HTN -A-fib appears to be new diagnosis during this hospital stay, as D/W wife, he is highly functional at baseline, and active, with no falls, discussed risks and benefits of anticoagulation, and plan to proceed with Eliquis after discussing risks and benefits with wife. -Initial Cardizem drip, discontinued, continue with metoprolol p.o., continue with as needed IV metoprolol.  -Patient apparently has an outpatient appointment scheduled with Dr. Antoine Poche for 08/24/2022   Severe protein calorie malnutrition Hypophosphatemia Hypokalemia -started on tube feed, high risk for refeeding syndrome, replete potassium and phosphorus. -He is on dysphagia 1 diet, but poor oral intake, continue with tube feed  Mild Transaminitis  -Most likely due to alcohol abuse, trending down.   Trigeminal neuralgia  - cont to hold gabapentin 600mg  QID, has significantly encephalopathic  DVT prophylaxis: Lovenox Code Status: Full Family Communication: discussed with wife by phone Disposition:   Status is: Inpatient    Consultants:  PCCM   Subjective:  Remains significantly confused, was agitated and aggressive overnight where he required wrist restraints.  Objective: Vitals:   08/19/22 1953 08/20/22 0000 08/20/22 0400 08/20/22 0800  BP:  135/87 (!) 148/80 (!) 141/85  Pulse:  64  60 82  Resp:  20 15 16   Temp:  98 F (36.7 C) 98.7 F (37.1 C) 98.4 F (36.9 C)  TempSrc:  Axillary Axillary Axillary  SpO2: 93% 91% 92% 94%  Weight:       Height:        Intake/Output Summary (Last 24 hours) at 08/20/2022 1016 Last data filed at 08/19/2022 1855 Gross per 24 hour  Intake 180 ml  Output 725 ml  Net -545 ml   Filed Weights   08/12/22 1809 08/16/22 0500  Weight: 71.9 kg 66.5 kg    Examination:  Awake, open eyes, significantly confused, does not follow any commands or answer any question, cachectic, deconditioned having core Trak tube Symmetrical Chest wall movement, Good air movement bilaterally, CTAB RRR,No Gallops,Rubs or new Murmurs, No Parasternal Heave +ve B.Sounds, Abd Soft, No tenderness, No rebound - guarding or rigidity. No Cyanosis, Clubbing or edema, No new Rash or bruise       Data Reviewed: I have personally reviewed following labs and imaging studies  CBC: Recent Labs  Lab 08/15/22 0232 08/16/22 0412 08/17/22 0024 08/18/22 0020 08/20/22 0319  WBC 9.9 10.6* 12.2* 14.8* 10.3  NEUTROABS  --  8.1*  --   --   --   HGB 11.5* 12.2* 11.5* 11.8* 12.2*  HCT 33.0* 35.9* 33.9* 35.6* 35.7*  MCV 92.4 90.2 90.4 90.4 91.1  PLT 215 254 313 372 448*    Basic Metabolic Panel: Recent Labs  Lab 08/17/22 0024 08/18/22 0016 08/18/22 0652 08/18/22 1006 08/18/22 1713 08/19/22 0553 08/19/22 1715 08/20/22 0319  NA 135 136 136  --   --  136  --  136  K 3.2* 3.3* 4.2  --   --  3.2*  --  2.7*  CL 102 104 103  --   --  101  --  102  CO2 21* 21* 21*  --   --  23  --  24  GLUCOSE 100* 110* 99  --   --  98  --  110*  BUN 15 14 12   --   --  8  --  10  CREATININE 0.66 0.60* 0.61  --   --  0.58*  --  0.57*  CALCIUM 8.0* 8.1* 8.2*  --   --  8.4*  --  8.4*  MG 2.1 1.8 2.0 1.9 2.0 1.9 2.0 2.0  PHOS 2.5 2.3*  --  1.7* 2.5 3.5 3.5 3.6    GFR: Estimated Creatinine Clearance: 79.7 mL/min (A) (by C-G formula based on SCr of 0.57 mg/dL (L)).  Liver Function Tests: Recent Labs  Lab 08/14/22 0339 08/15/22 0232 08/17/22 0024 08/18/22 0016 08/20/22 0319  AST 61* 72* 102* 119* 75*  ALT 31 45* 90* 106* 105*   ALKPHOS 76 54 48 55 55  BILITOT 1.3* 0.9 0.7 0.8 0.6  PROT 5.9* 5.2* 5.5* 5.8* 6.1*  ALBUMIN 2.5* 2.2* 2.2* 2.2* 2.4*    CBG: Recent Labs  Lab 08/19/22 1137 08/19/22 1541 08/19/22 2311 08/20/22 0451 08/20/22 0816  GLUCAP 116* 124* 117* 101* 121*     Recent Results (from the past 240 hour(s))  Resp panel by RT-PCR (RSV, Flu A&B, Covid) Anterior Nasal Swab     Status: None   Collection Time: 08/12/22  2:30 PM   Specimen: Anterior Nasal Swab  Result Value Ref Range Status   SARS Coronavirus 2 by RT PCR NEGATIVE NEGATIVE Final   Influenza A by PCR NEGATIVE NEGATIVE Final   Influenza B by  PCR NEGATIVE NEGATIVE Final    Comment: (NOTE) The Xpert Xpress SARS-CoV-2/FLU/RSV plus assay is intended as an aid in the diagnosis of influenza from Nasopharyngeal swab specimens and should not be used as a sole basis for treatment. Nasal washings and aspirates are unacceptable for Xpert Xpress SARS-CoV-2/FLU/RSV testing.  Fact Sheet for Patients: BloggerCourse.com  Fact Sheet for Healthcare Providers: SeriousBroker.it  This test is not yet approved or cleared by the [place] FDA and has been authorized for detection and/or diagnosis of SARS-CoV-2 by FDA under an Emergency Use Authorization (EUA). This EUA will remain in effect (meaning this test can be used) for the duration of the COVID-19 declaration under Section 564(b)(1) of the Act, 21 U.S.C. section 360bbb-3(b)(1), unless the authorization is terminated or revoked.     Resp Syncytial Virus by PCR NEGATIVE NEGATIVE Final    Comment: (NOTE) Fact Sheet for Patients: BloggerCourse.com  Fact Sheet for Healthcare Providers: SeriousBroker.it  This test is not yet approved or cleared by the [place] FDA and has been authorized for detection and/or diagnosis of SARS-CoV-2 by FDA under an Emergency Use Authorization (EUA).  This EUA will remain in effect (meaning this test can be used) for the duration of the COVID-19 declaration under Section 564(b)(1) of the Act, 21 U.S.C. section 360bbb-3(b)(1), unless the authorization is terminated or revoked.  Performed at [facility], [address] [place], [place] [address]   Blood Culture (routine x 2)     Status: None   Collection Time: 08/12/22  3:10 PM   Specimen: BLOOD  Result Value Ref Range Status   Specimen Description BLOOD LEFT ANTECUBITAL  Final   Special Requests   Final    BOTTLES DRAWN AEROBIC AND ANAEROBIC Blood Culture results may not be optimal due to an excessive volume of blood received in culture bottles   Culture   Final    NO GROWTH 5 DAYS Performed at [facility], [address] [place], [place] [address]    Report Status 08/17/2022 FINAL  Final  Blood Culture (routine x 2)     Status: None   Collection Time: 08/12/22  3:20 PM   Specimen: BLOOD RIGHT FOREARM  Result Value Ref Range Status   Specimen Description BLOOD RIGHT FOREARM  Final   Special Requests   Final    BOTTLES DRAWN AEROBIC AND ANAEROBIC Blood Culture adequate volume   Culture   Final    NO GROWTH 5 DAYS Performed at [facility], [address], [place], [place] [address]    Report Status 08/17/2022 FINAL  Final  MRSA Next Gen by PCR, Nasal     Status: None   Collection Time: 08/15/22  2:11 AM   Specimen: Nasal Mucosa; Nasal Swab  Result Value Ref Range Status   MRSA by PCR Next Gen NOT DETECTED NOT DETECTED Final    Comment: (NOTE) The GeneXpert MRSA Assay (FDA approved for NASAL specimens only), is one component of a comprehensive MRSA colonization surveillance program. It is not intended to diagnose MRSA infection nor to guide or monitor treatment for MRSA infections. Test performance is not FDA approved in patients less than 53 years old. Performed at [facility], [address] [place], [place] [address]   Respiratory (~20  pathogens) panel by PCR     Status: Abnormal   Collection Time: 08/15/22  6:00 AM   Specimen: Nasopharyngeal Swab; Respiratory  Result Value Ref Range Status   Adenovirus NOT  DETECTED NOT DETECTED Final   Coronavirus 229E NOT DETECTED NOT DETECTED Final    Comment: (NOTE) The Coronavirus on the Respiratory Panel, DOES NOT test for the novel  Coronavirus (2019 nCoV)    Coronavirus HKU1 NOT DETECTED NOT DETECTED Final   Coronavirus NL63 NOT DETECTED NOT DETECTED Final   Coronavirus OC43 NOT DETECTED NOT DETECTED Final   Metapneumovirus NOT DETECTED NOT DETECTED Final   Rhinovirus / Enterovirus DETECTED (A) NOT DETECTED Final   Influenza A NOT DETECTED NOT DETECTED Final   Influenza B NOT DETECTED NOT DETECTED Final   Parainfluenza Virus 1 NOT DETECTED NOT DETECTED Final   Parainfluenza Virus 2 NOT DETECTED NOT DETECTED Final   Parainfluenza Virus 3 NOT DETECTED NOT DETECTED Final   Parainfluenza Virus 4 NOT DETECTED NOT DETECTED Final   Respiratory Syncytial Virus NOT DETECTED NOT DETECTED Final   Bordetella pertussis NOT DETECTED NOT DETECTED Final   Bordetella Parapertussis NOT DETECTED NOT DETECTED Final   Chlamydophila pneumoniae NOT DETECTED NOT DETECTED Final   Mycoplasma pneumoniae NOT DETECTED NOT DETECTED Final    Comment: Performed at [facility], [address] [place], [place] [address]         Radiology Studies: DG Abd Portable 1V  Result Date: 08/18/2022 CLINICAL DATA:  Evaluate feeding tube placement EXAM: PORTABLE ABDOMEN - 1 VIEW COMPARISON:  None Available. FINDINGS: The feeding tube terminates in the lower abdomen to the left of midline, likely within the distal aspect of a J-shaped or distended stomach. IMPRESSION: The feeding tube terminates in the lower abdomen to the left of midline, likely within the distal aspect of a J-shaped or distended stomach. Electronically Signed   By: Gerome Sam III M.D.   On: 08/18/2022 18:20        Scheduled  Meds:  Chlorhexidine Gluconate Cloth  6 each Topical Daily   Chlorhexidine Gluconate Cloth  6 each Topical Daily   feeding supplement (PROSource TF20)  60 mL Per Tube Daily   folic acid  1 mg Intravenous Daily   free water  150 mL Per Tube Q4H   metoprolol tartrate  25 mg Per Tube BID   multivitamin with minerals  1 tablet Oral Daily   PHENObarbital  32.5 mg Intravenous Q8H   [START ON 08/21/2022] phosphorus  250 mg Oral Daily   potassium chloride  60 mEq Oral Q4H   sodium chloride flush  3 mL Intravenous Q12H   thiamine  100 mg Intravenous Daily   Continuous Infusions:  sodium chloride Stopped (08/17/22 1434)   feeding supplement (OSMOLITE 1.5 CAL) 30 mL/hr at 08/20/22 0215   potassium chloride 10 mEq (08/20/22 0933)     LOS: 7 days      Huey Bienenstock, MD [facility]   To contact the attending provider between 7A-7P or the covering provider during after hours 7P-7A, please log into the web site [email or web address] and access using universal [place] password for that web site. If you do not have the password, please call the hospital operator.  08/20/2022, 10:16 AM

## 2022-08-20 NOTE — Discharge Instructions (Signed)
Information on my medicine - ELIQUIS (apixaban)  This medication education was reviewed with me or my healthcare representative as part of my discharge preparation.  The pharmacist that spoke with me during my hospital stay was:  Lucretia Pendley, RPH  Why was Eliquis prescribed for you? Eliquis was prescribed for you to reduce the risk of forming blood clots that can cause a stroke if you have a medical condition called atrial fibrillation (a type of irregular heartbeat) OR to reduce the risk of a blood clots forming after orthopedic surgery.  What do You need to know about Eliquis ? Take your Eliquis TWICE DAILY - one tablet in the morning and one tablet in the evening with or without food.  It would be best to take the doses about the same time each day.  If you have difficulty swallowing the tablet whole please discuss with your pharmacist how to take the medication safely.  Take Eliquis exactly as prescribed by your doctor and DO NOT stop taking Eliquis without talking to the doctor who prescribed the medication.  Stopping may increase your risk of developing a new clot or stroke.  Refill your prescription before you run out.  After discharge, you should have regular check-up appointments with your healthcare provider that is prescribing your Eliquis.  In the future your dose may need to be changed if your kidney function or weight changes by a significant amount or as you get older.  What do you do if you miss a dose? If you miss a dose, take it as soon as you remember on the same day and resume taking twice daily.  Do not take more than one dose of ELIQUIS at the same time.  Important Safety Information A possible side effect of Eliquis is bleeding. You should call your healthcare provider right away if you experience any of the following: Bleeding from an injury or your nose that does not stop. Unusual colored urine (red or dark brown) or unusual colored stools (red or  black). Unusual bruising for unknown reasons. A serious fall or if you hit your head (even if there is no bleeding).  Some medicines may interact with Eliquis and might increase your risk of bleeding or clotting while on Eliquis. To help avoid this, consult your healthcare provider or pharmacist prior to using any new prescription or non-prescription medications, including herbals, vitamins, non-steroidal anti-inflammatory drugs (NSAIDs) and supplements.  This website has more information on Eliquis (apixaban): http://www.eliquis.com/eliquis/home   

## 2022-08-20 NOTE — Plan of Care (Signed)
  Problem: Fluid Volume: Goal: Hemodynamic stability will improve Outcome: Progressing   Problem: Clinical Measurements: Goal: Diagnostic test results will improve Outcome: Progressing Goal: Signs and symptoms of infection will decrease Outcome: Progressing   Problem: Respiratory: Goal: Ability to maintain adequate ventilation will improve Outcome: Progressing   Problem: Education: Goal: Knowledge of General Education information will improve Description: Including pain rating scale, medication(s)/side effects and non-pharmacologic comfort measures Outcome: Progressing   Problem: Health Behavior/Discharge Planning: Goal: Ability to manage health-related needs will improve Outcome: Progressing   Problem: Clinical Measurements: Goal: Ability to maintain clinical measurements within normal limits will improve Outcome: Progressing Goal: Will remain free from infection Outcome: Progressing Goal: Diagnostic test results will improve Outcome: Progressing Goal: Respiratory complications will improve Outcome: Progressing Goal: Cardiovascular complication will be avoided Outcome: Progressing   Problem: Activity: Goal: Risk for activity intolerance will decrease Outcome: Progressing   Problem: Nutrition: Goal: Adequate nutrition will be maintained Outcome: Progressing   Problem: Coping: Goal: Level of anxiety will decrease Outcome: Progressing   Problem: Elimination: Goal: Will not experience complications related to bowel motility Outcome: Progressing Goal: Will not experience complications related to urinary retention Outcome: Progressing   Problem: Pain Managment: Goal: General experience of comfort will improve Outcome: Progressing   Problem: Safety: Goal: Ability to remain free from injury will improve Outcome: Progressing   Problem: Skin Integrity: Goal: Risk for impaired skin integrity will decrease Outcome: Progressing   Problem: Safety: Goal:  Non-violent Restraint(s) Outcome: Progressing   

## 2022-08-21 DIAGNOSIS — G5132 Clonic hemifacial spasm, left: Secondary | ICD-10-CM | POA: Diagnosis not present

## 2022-08-21 DIAGNOSIS — F10939 Alcohol use, unspecified with withdrawal, unspecified: Secondary | ICD-10-CM | POA: Diagnosis not present

## 2022-08-21 DIAGNOSIS — G9341 Metabolic encephalopathy: Secondary | ICD-10-CM | POA: Diagnosis not present

## 2022-08-21 DIAGNOSIS — A419 Sepsis, unspecified organism: Secondary | ICD-10-CM | POA: Diagnosis not present

## 2022-08-21 LAB — GLUCOSE, CAPILLARY
Glucose-Capillary: 118 mg/dL — ABNORMAL HIGH (ref 70–99)
Glucose-Capillary: 124 mg/dL — ABNORMAL HIGH (ref 70–99)
Glucose-Capillary: 90 mg/dL (ref 70–99)
Glucose-Capillary: 107 mg/dL — ABNORMAL HIGH (ref 70–99)
Glucose-Capillary: 120 mg/dL — ABNORMAL HIGH (ref 70–99)
Glucose-Capillary: 117 mg/dL — ABNORMAL HIGH (ref 70–99)

## 2022-08-21 LAB — BASIC METABOLIC PANEL
Anion gap: 16 — ABNORMAL HIGH (ref 5–15)
BUN: 10 mg/dL (ref 8–23)
CO2: 22 mmol/L (ref 22–32)
Calcium: 9.2 mg/dL (ref 8.9–10.3)
Chloride: 102 mmol/L (ref 98–111)
Creatinine, Ser: 0.48 mg/dL — ABNORMAL LOW (ref 0.61–1.24)
GFR, Estimated: >60 mL/min (ref >60)
Glucose, Bld: 106 mg/dL — ABNORMAL HIGH (ref 70–99)
Potassium: 4 mmol/L (ref 3.5–5.1)
Sodium: 140 mmol/L (ref 135–145)

## 2022-08-21 LAB — PHOSPHORUS: Phosphorus: 3.2 mg/dL (ref 2.5–4.6)

## 2022-08-21 LAB — MAGNESIUM: Magnesium: 2.1 mg/dL (ref 1.7–2.4)

## 2022-08-21 MED ORDER — PROSOURCE TF20 ENFIT COMPATIBL EN LIQD
60.0000 mL | Freq: Two times a day (BID) | ENTERAL | Status: DC
  Administered 2022-08-21 – 2022-08-22 (×2): 60 mL
  Filled 2022-08-21: qty 60

## 2022-08-21 MED ORDER — OSMOLITE 1.5 CAL PO LIQD
1000.0000 mL | ORAL | Status: DC
  Administered 2022-08-21: 1000 mL
  Filled 2022-08-21 (×2): qty 1000

## 2022-08-21 NOTE — TOC Progression Note (Signed)
Transition of Care [facility]) - Progression Note    Patient Details  Name: James Ibarra MRN: [id number] Date of Birth: [birth year]  [facility] CM/SW Contact  Mearl Latin, LCSW Phone Number: 08/21/2022, 2:26 PM  Clinical Narrative:    CSW continuing to follow for medical stability.      Barriers to Discharge: Continued Medical Work up  Expected Discharge Plan and Services In-house Referral: Clinical Social Work     Living arrangements for the past 2 months: Single Family Home                                       Social Determinants of Health (SDOH) Interventions SDOH Screenings   Food Insecurity: No Food Insecurity (08/12/2022)  Housing: Low Risk  (08/12/2022)  Transportation Needs: No Transportation Needs (08/12/2022)  Utilities: Not At Risk (08/12/2022)  Tobacco Use: Low Risk  (08/12/2022)    Readmission Risk Interventions     No data to display

## 2022-08-21 NOTE — Progress Notes (Signed)
Speech Language Pathology Treatment: Dysphagia  Patient Details Name: James Ibarra MRN: [id number] DOB: 12/01/1950 Today's Date: 08/21/2022 Time: 7829-5621 SLP Time Calculation (min) (ACUTE ONLY): 18 min  Assessment / Plan / Recommendation Clinical Impression  Minimal changes in overall swallowing. Mr. Perezgarcia continues to cough explosively after all trials of thin liquids this morning.  He is more talkative; confusion persists. He followed commands intermittently. Required verbal cues to open eyes and maintain wakefulness for eating/drinking. Ate most of dysphagia 1 breakfast.  Mentation remains primary obstacle to advancing diet. Will plan for MBS next date to determine potential for safe diet upgrade.  Will follow.   HPI HPI: 72 y/o M adm 6/8 from home with weakness, severe sepsis 2/2 multifocal PNA, acute metabolic encephalopathy with alcohol withdrawal symptoms. 6/11 transfer to ICU with hypoxemia and agitation. PMH includes trigeminal neuralgia, facial spasm, HTN and HLD      SLP Plan  Continue with current plan of care      Recommendations for follow up therapy are one component of a multi-disciplinary discharge planning process, led by the attending physician.  Recommendations may be updated based on patient status, additional functional criteria and insurance authorization.    Recommendations  Diet recommendations: Dysphagia 1 (puree);Honey-thick liquid Liquids provided via: Cup;Straw Medication Administration: Whole meds with liquid Supervision: Full supervision/cueing for compensatory strategies;Trained caregiver to feed patient Compensations: Minimize environmental distractions Postural Changes and/or Swallow Maneuvers: Seated upright 90 degrees                  Oral care BID   Frequent or constant Supervision/Assistance Dysphagia, unspecified (R13.10)     Continue with current plan of care   Terell Kincy L. Samson Frederic, MA CCC/SLP Clinical Specialist - Acute Care  SLP Acute Rehabilitation Services Office number [phone]   [name] [name]  08/21/2022, 10:12 AM

## 2022-08-21 NOTE — Plan of Care (Signed)
  Problem: Fluid Volume: Goal: Hemodynamic stability will improve Outcome: Progressing   Problem: Clinical Measurements: Goal: Diagnostic test results will improve Outcome: Progressing Goal: Signs and symptoms of infection will decrease Outcome: Progressing   Problem: Respiratory: Goal: Ability to maintain adequate ventilation will improve Outcome: Progressing   Problem: Education: Goal: Knowledge of General Education information will improve Description: Including pain rating scale, medication(s)/side effects and non-pharmacologic comfort measures Outcome: Progressing   Problem: Health Behavior/Discharge Planning: Goal: Ability to manage health-related needs will improve Outcome: Progressing   Problem: Clinical Measurements: Goal: Ability to maintain clinical measurements within normal limits will improve Outcome: Progressing Goal: Will remain free from infection Outcome: Progressing Goal: Diagnostic test results will improve Outcome: Progressing Goal: Respiratory complications will improve Outcome: Progressing Goal: Cardiovascular complication will be avoided Outcome: Progressing   Problem: Activity: Goal: Risk for activity intolerance will decrease Outcome: Progressing   Problem: Nutrition: Goal: Adequate nutrition will be maintained Outcome: Progressing   Problem: Coping: Goal: Level of anxiety will decrease Outcome: Progressing   Problem: Elimination: Goal: Will not experience complications related to bowel motility Outcome: Progressing Goal: Will not experience complications related to urinary retention Outcome: Progressing   Problem: Pain Managment: Goal: General experience of comfort will improve Outcome: Progressing   Problem: Safety: Goal: Ability to remain free from injury will improve Outcome: Progressing   Problem: Skin Integrity: Goal: Risk for impaired skin integrity will decrease Outcome: Progressing   Problem: Safety: Goal:  Non-violent Restraint(s) Outcome: Progressing

## 2022-08-21 NOTE — Progress Notes (Signed)
Nutrition Follow-up  DOCUMENTATION CODES:  Severe malnutrition in context of chronic illness  INTERVENTION:  Transition to nocturnal tube feeds via Cortrak: Osmolite 1.5 at 83 mL/hr x 12 hours from 1800 to 0600 (1000 mL per day) 60 mL ProSource TF20 - BID Free water flush: 150 mL q4h Tube feeds at goal provides 1660 kcal, 103 gm protein, and 1662 mL total free water daily.  Continue Folic acid, Thiamine, and Multivitamin w/ minerals daily  NUTRITION DIAGNOSIS:  Severe Malnutrition related to chronic illness as evidenced by severe muscle depletion, severe fat depletion. - Ongoing, being addressed via TF  GOAL:  Patient will meet greater than or equal to 90% of their needs - Being met via TF  MONITOR:  PO intake, Diet advancement, Labs, Weight trends, TF tolerance  REASON FOR ASSESSMENT:  Consult Enteral/tube feeding initiation and management  ASSESSMENT:  72 y.o. male presented to the ED with fever, cough, and weakness. PMH includes HLD, HTN, facial spasm, EtOH abuse, and trigeminal neuralgia. Pt admitted with sepsis 2/2 pneumonia.    6/08 - Admitted; regular diet 6/11 - NPO; transferred to ICU 6/13 - diet advanced to dysphagia 1, honey thick liquids 6/14 - transferred to floor; Cortrak placed   RD received a secure chat from MD to adjust TF to nocturnal TF to help promote appetite during the day.  Spoke with RN, pt not eating much for her. Asked RN to stop tube feeds to be transitioned to nocturnal starting tonight.  Pt laying in bed, does not answer many of RD questions. Does say he thinks that he had pancakes for breakfast, but was unsure.   Meal Intake 6/13-6/14: 50-70% x2 meals 6/15-6/17: 60-80% x 3 meals   Medications reviewed and include: Folic acid, MVI, Phosphorus, Thiamine  Labs reviewed: Sodium 140, Potassium 4.0, Phosphorus 3.2, Magnesium 2.1  CBG: 113-124 x 24 hrs  Diet Order:   Diet Order             DIET - DYS 1 Room service appropriate? Yes with  Assist; Fluid consistency: Honey Thick  Diet effective now                  EDUCATION NEEDS: Not appropriate for education at this time  Skin:  Skin Assessment: Reviewed RN Assessment  Last BM:  6/17 - Type 6, large  Height:  Ht Readings from Last 1 Encounters:  08/12/22 6\' 2"  (1.88 m)   Weight:  Wt Readings from Last 1 Encounters:  08/21/22 67 kg   Ideal Body Weight:  86.4 kg  BMI:  Body mass index is 18.96 kg/m.  Estimated Nutritional Needs:  Kcal:  1900-2100 Protein:  95-115 grams Fluid:  >/= 1.9 L   Kirby Crigler RD, LDN Clinical Dietitian See [facility] for contact information.

## 2022-08-21 NOTE — Progress Notes (Signed)
PROGRESS NOTE        PATIENT DETAILS Name: James Ibarra Age: 72 y.o. Sex: male Date of Birth: 15-Nov-1950 Admit Date: 08/12/2022 Admitting Physician Almon Hercules, MD [id number], [name], MD  Brief Summary: Patient is a 72 y.o.  male with history of EtOH use, HTN, HLD-presented with fever/tachycardia-found to have PNA-Hospital course complicated by worsening hypoxemia requiring HFNC, A-fib RVR, and alcohol withdrawal/DTs.  Significant events: 6/8>> admit to [facility] with PNA 6/11>> worsening R-sided infiltrate, hypoxia and mental status, transfer to ICU on HFNC 6/12>> phenobarb taper added, confused, IV cardizem  6/14>> up walking in hallway, transferred to Progressive care.  Significant studies: 6/8>> CXR: Multifocal PNA 6/11>> CXR: Worsening of interstitial/patchy airspace opacities. 6/11>> CXR: Improved aeration of right lung base.  Significant microbiology data: 6/8>> COVID/influenza/RSV PCR: Negative 6/8>> blood culture: No growth 6/11>> respiratory virus panel: Rhinovirus  Procedures: None  Consults: PCCM  Subjective: Remains in restraints-following simple commands-appears restless.    Objective: Vitals: Blood pressure 133/85, pulse 77, temperature 97.8 F (36.6 C), temperature source Oral, resp. rate (!) 22, height 6\' 2"  (1.88 m), weight 67 kg, SpO2 97 %.   Exam: Gen Exam:Alert awake-not in any distress HEENT:atraumatic, normocephalic Chest: B/L clear to auscultation anteriorly CVS:S1S2 regular Abdomen:soft non tender, non distended Extremities:no edema Neurology: Non focal Skin: no rash  Pertinent Labs/Radiology:    Latest Ref Rng & Units 08/20/2022    3:19 AM 08/18/2022   12:20 AM 08/17/2022   12:24 AM  CBC  WBC 4.0 - 10.5 K/uL 10.3  14.8  12.2   Hemoglobin 13.0 - 17.0 g/dL 78.2  95.6  21.3   Hematocrit 39.0 - 52.0 % 35.7  35.6  33.9   Platelets 150 - 400 K/uL 448  372  313     Lab Results  Component Value Date   NA 140  08/21/2022   K 4.0 08/21/2022   CL 102 08/21/2022   CO2 22 08/21/2022      Assessment/Plan: Acute hypoxic respiratory failure secondary to secondary to rhinovirus pneumonia and superimposed bacterial infection Clinically improved-on room air Has finished a course of antibiotics  Acute metabolic encephalopathy in the setting of hypoxia/PNA Alcohol withdrawal with severe DTs Still in restraints-apparently gets agitated easily Awake/alert-following simple commands this morning Suspect he is slowly improving Continue phenobarb taper and Ativan per CIWA protocol.  PAF Maintaining sinus rhythm Continue metoprolol Eliquis  Trigeminal neuralgia Stable Neurontin held due to encephalopathy-resume when able.  Oropharyngeal dysphagia Due to severe encephalopathy Continue NG tube feedings Will ask nursing staff to switch to nocturnal feeds only-in order to encourage oral intake during the daytime.  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  Debility/deconditioning Due to acute illness SNF recommended  Nutrition Status: Nutrition Problem: Severe Malnutrition Etiology: chronic illness Signs/Symptoms: severe muscle depletion, severe fat depletion Interventions: Tube feeding, MVI  BMI: Estimated body mass index is 18.96 kg/m as calculated from the following:   Height as of this encounter: 6\' 2"  (1.88 m).   Weight as of this encounter: 67 kg.   Code status:   Code Status: Full Code   DVT Prophylaxis: apixaban (ELIQUIS) tablet 5 mg     Family Communication: None at bedside   Disposition Plan: Status is: Inpatient Remains inpatient appropriate because: Severity of illness.   Planned Discharge Destination: SNF  Diet: Diet Order             DIET - DYS 1 Room service appropriate? Yes with Assist; Fluid consistency: Honey Thick  Diet effective now                     Antimicrobial agents: Anti-infectives (From  admission, onward)    Start     Dose/Rate Route Frequency Ordered Stop   08/15/22 1200  vancomycin (VANCOCIN) IVPB 1000 mg/200 mL premix  Status:  Discontinued        1,000 mg 200 mL/hr over 60 Minutes Intravenous Every 12 hours 08/15/22 0214 08/15/22 1146   08/15/22 1200  ceFEPIme (MAXIPIME) 2 g in sodium chloride 0.9 % 100 mL IVPB        2 g 200 mL/hr over 30 Minutes Intravenous Every 8 hours 08/15/22 0404 08/17/22 2141   08/15/22 0500  ceFEPIme (MAXIPIME) 2 g in sodium chloride 0.9 % 100 mL IVPB        2 g 200 mL/hr over 30 Minutes Intravenous  Once 08/15/22 0404 08/16/22 1900   08/15/22 0300  vancomycin (VANCOREADY) IVPB 1500 mg/300 mL        1,500 mg 150 mL/hr over 120 Minutes Intravenous  Once 08/15/22 0201 08/15/22 0446   08/13/22 1600  azithromycin (ZITHROMAX) tablet 500 mg  Status:  Discontinued        500 mg Oral Daily 08/13/22 1109 08/17/22 1034   08/12/22 1445  cefTRIAXone (ROCEPHIN) 2 g in sodium chloride 0.9 % 100 mL IVPB  Status:  Discontinued        2 g 200 mL/hr over 30 Minutes Intravenous Every 24 hours 08/12/22 1430 08/15/22 0400   08/12/22 1445  azithromycin (ZITHROMAX) 500 mg in sodium chloride 0.9 % 250 mL IVPB  Status:  Discontinued        500 mg 250 mL/hr over 60 Minutes Intravenous Every 24 hours 08/12/22 1430 08/13/22 1108        MEDICATIONS: Scheduled Meds:  apixaban  5 mg Oral BID   Chlorhexidine Gluconate Cloth  6 each Topical Daily   Chlorhexidine Gluconate Cloth  6 each Topical Daily   feeding supplement (PROSource TF20)  60 mL Per Tube Daily   folic acid  1 mg Intravenous Daily   free water  150 mL Per Tube Q4H   metoprolol tartrate  25 mg Per Tube BID   multivitamin with minerals  1 tablet Oral Daily   PHENObarbital  32.5 mg Intravenous Q8H   phosphorus  250 mg Oral Daily   sodium chloride flush  3 mL Intravenous Q12H   thiamine  100 mg Intravenous Daily   Continuous Infusions:  sodium chloride Stopped (08/17/22 1434)   feeding supplement  (OSMOLITE 1.5 CAL) 30 mL/hr at 08/20/22 0215   PRN Meds:.sodium chloride, food thickener, guaiFENesin-dextromethorphan, hydrALAZINE, ipratropium-albuterol, LORazepam, LORazepam **OR** LORazepam, metoprolol tartrate, mouth rinse   I have personally reviewed following labs and imaging studies  LABORATORY DATA: CBC: Recent Labs  Lab 08/15/22 0232 08/16/22 0412 08/17/22 0024 08/18/22 0020 08/20/22 0319  WBC 9.9 10.6* 12.2* 14.8* 10.3  NEUTROABS  --  8.1*  --   --   --   HGB 11.5* 12.2* 11.5* 11.8* 12.2*  HCT 33.0* 35.9* 33.9* 35.6* 35.7*  MCV 92.4 90.2 90.4 90.4 91.1  PLT 215 254 313 372 448*    Basic Metabolic Panel: Recent Labs  Lab 08/18/22 0016 08/18/22 0652 08/18/22 1006 08/18/22 1713 08/19/22 0553 08/19/22 1715 08/20/22  0319 08/20/22 1456 08/21/22 0316  NA 136 136  --   --  136  --  136  --  140  K 3.3* 4.2  --   --  3.2*  --  2.7* 4.2 4.0  CL 104 103  --   --  101  --  102  --  102  CO2 21* 21*  --   --  23  --  24  --  22  GLUCOSE 110* 99  --   --  98  --  110*  --  106*  BUN 14 12  --   --  8  --  10  --  10  CREATININE 0.60* 0.61  --   --  0.58*  --  0.57*  --  0.48*  CALCIUM 8.1* 8.2*  --   --  8.4*  --  8.4*  --  9.2  MG 1.8 2.0   < > 2.0 1.9 2.0 2.0  --  2.1  PHOS 2.3*  --    < > 2.5 3.5 3.5 3.6  --  3.2   < > = values in this interval not displayed.    GFR: Estimated Creatinine Clearance: 80.3 mL/min (A) (by C-G formula based on SCr of 0.48 mg/dL (L)).  Liver Function Tests: Recent Labs  Lab 08/15/22 0232 08/17/22 0024 08/18/22 0016 08/20/22 0319  AST 72* 102* 119* 75*  ALT 45* 90* 106* 105*  ALKPHOS 54 48 55 55  BILITOT 0.9 0.7 0.8 0.6  PROT 5.2* 5.5* 5.8* 6.1*  ALBUMIN 2.2* 2.2* 2.2* 2.4*   No results for input(s): "LIPASE", "AMYLASE" in the last 168 hours. No results for input(s): "AMMONIA" in the last 168 hours.  Coagulation Profile: No results for input(s): "INR", "PROTIME" in the last 168 hours.  Cardiac Enzymes: No results for  input(s): "CKTOTAL", "CKMB", "CKMBINDEX", "TROPONINI" in the last 168 hours.  BNP (last 3 results) No results for input(s): "PROBNP" in the last 8760 hours.  Lipid Profile: No results for input(s): "CHOL", "HDL", "LDLCALC", "TRIG", "CHOLHDL", "LDLDIRECT" in the last 72 hours.  Thyroid Function Tests: No results for input(s): "TSH", "T4TOTAL", "FREET4", "T3FREE", "THYROIDAB" in the last 72 hours.  Anemia Panel: No results for input(s): "VITAMINB12", "FOLATE", "FERRITIN", "TIBC", "IRON", "RETICCTPCT" in the last 72 hours.  Urine analysis:    Component Value Date/Time   COLORURINE AMBER (A) 08/13/2022 0930   APPEARANCEUR CLEAR 08/13/2022 0930   LABSPEC 1.026 08/13/2022 0930   PHURINE 6.0 08/13/2022 0930   GLUCOSEU NEGATIVE 08/13/2022 0930   HGBUR NEGATIVE 08/13/2022 0930   BILIRUBINUR NEGATIVE 08/13/2022 0930   KETONESUR NEGATIVE 08/13/2022 0930   PROTEINUR 30 (A) 08/13/2022 0930   NITRITE NEGATIVE 08/13/2022 0930   LEUKOCYTESUR NEGATIVE 08/13/2022 0930    Sepsis Labs: Lactic Acid, Venous    Component Value Date/Time   LATICACIDVEN 0.9 08/15/2022 0232    MICROBIOLOGY: Recent Results (from the past 240 hour(s))  Resp panel by RT-PCR (RSV, Flu A&B, Covid) Anterior Nasal Swab     Status: None   Collection Time: 08/12/22  2:30 PM   Specimen: Anterior Nasal Swab  Result Value Ref Range Status   SARS Coronavirus 2 by RT PCR NEGATIVE NEGATIVE Final   Influenza A by PCR NEGATIVE NEGATIVE Final   Influenza B by PCR NEGATIVE NEGATIVE Final    Comment: (NOTE) The Xpert Xpress SARS-CoV-2/FLU/RSV plus assay is intended as an aid in the diagnosis of influenza from Nasopharyngeal swab specimens and should not be used  as a sole basis for treatment. Nasal washings and aspirates are unacceptable for Xpert Xpress SARS-CoV-2/FLU/RSV testing.  Fact Sheet for Patients: BloggerCourse.com  Fact Sheet for Healthcare  Providers: SeriousBroker.it  This test is not yet approved or cleared by the [place] FDA and has been authorized for detection and/or diagnosis of SARS-CoV-2 by FDA under an Emergency Use Authorization (EUA). This EUA will remain in effect (meaning this test can be used) for the duration of the COVID-19 declaration under Section 564(b)(1) of the Act, 21 U.S.C. section 360bbb-3(b)(1), unless the authorization is terminated or revoked.     Resp Syncytial Virus by PCR NEGATIVE NEGATIVE Final    Comment: (NOTE) Fact Sheet for Patients: BloggerCourse.com  Fact Sheet for Healthcare Providers: SeriousBroker.it  This test is not yet approved or cleared by the [place] FDA and has been authorized for detection and/or diagnosis of SARS-CoV-2 by FDA under an Emergency Use Authorization (EUA). This EUA will remain in effect (meaning this test can be used) for the duration of the COVID-19 declaration under Section 564(b)(1) of the Act, 21 U.S.C. section 360bbb-3(b)(1), unless the authorization is terminated or revoked.  Performed at [facility], [address], [place], [place] [address]   Blood Culture (routine x 2)     Status: None   Collection Time: 08/12/22  3:10 PM   Specimen: BLOOD  Result Value Ref Range Status   Specimen Description BLOOD LEFT ANTECUBITAL  Final   Special Requests   Final    BOTTLES DRAWN AEROBIC AND ANAEROBIC Blood Culture results may not be optimal due to an excessive volume of blood received in culture bottles   Culture   Final    NO GROWTH 5 DAYS Performed at [facility], [address], [place], [place] [address]    Report Status 08/17/2022 FINAL  Final  Blood Culture (routine x 2)     Status: None   Collection Time: 08/12/22  3:20 PM   Specimen: BLOOD RIGHT FOREARM  Result Value Ref Range Status   Specimen Description BLOOD RIGHT FOREARM  Final   Special  Requests   Final    BOTTLES DRAWN AEROBIC AND ANAEROBIC Blood Culture adequate volume   Culture   Final    NO GROWTH 5 DAYS Performed at [facility], [address], [place], [place] [address]    Report Status 08/17/2022 FINAL  Final  MRSA Next Gen by PCR, Nasal     Status: None   Collection Time: 08/15/22  2:11 AM   Specimen: Nasal Mucosa; Nasal Swab  Result Value Ref Range Status   MRSA by PCR Next Gen NOT DETECTED NOT DETECTED Final    Comment: (NOTE) The GeneXpert MRSA Assay (FDA approved for NASAL specimens only), is one component of a comprehensive MRSA colonization surveillance program. It is not intended to diagnose MRSA infection nor to guide or monitor treatment for MRSA infections. Test performance is not FDA approved in patients less than 68 years old. Performed at [facility], [address], [place], [place] [address]   Respiratory (~20 pathogens) panel by PCR     Status: Abnormal   Collection Time: 08/15/22  6:00 AM   Specimen: Nasopharyngeal Swab; Respiratory  Result Value Ref Range Status   Adenovirus NOT DETECTED NOT DETECTED Final   Coronavirus 229E NOT DETECTED NOT DETECTED Final    Comment: (NOTE) The Coronavirus on the Respiratory Panel, DOES NOT test for the novel  Coronavirus (2019 nCoV)  Coronavirus HKU1 NOT DETECTED NOT DETECTED Final   Coronavirus NL63 NOT DETECTED NOT DETECTED Final   Coronavirus OC43 NOT DETECTED NOT DETECTED Final   Metapneumovirus NOT DETECTED NOT DETECTED Final   Rhinovirus / Enterovirus DETECTED (A) NOT DETECTED Final   Influenza A NOT DETECTED NOT DETECTED Final   Influenza B NOT DETECTED NOT DETECTED Final   Parainfluenza Virus 1 NOT DETECTED NOT DETECTED Final   Parainfluenza Virus 2 NOT DETECTED NOT DETECTED Final   Parainfluenza Virus 3 NOT DETECTED NOT DETECTED Final   Parainfluenza Virus 4 NOT DETECTED NOT DETECTED Final   Respiratory Syncytial Virus NOT DETECTED NOT DETECTED Final   Bordetella  pertussis NOT DETECTED NOT DETECTED Final   Bordetella Parapertussis NOT DETECTED NOT DETECTED Final   Chlamydophila pneumoniae NOT DETECTED NOT DETECTED Final   Mycoplasma pneumoniae NOT DETECTED NOT DETECTED Final    Comment: Performed at [facility], [address], [place], [place] [address]    RADIOLOGY STUDIES/RESULTS: No results found.   LOS: 8 days   Jeoffrey Massed, MD  [facility]    To contact the attending provider between 7A-7P or the covering provider during after hours 7P-7A, please log into the web site [email or web address] and access using universal [place] password for that web site. If you do not have the password, please call the hospital operator.  08/21/2022, 9:13 AM

## 2022-08-22 ENCOUNTER — Inpatient Hospital Stay (HOSPITAL_COMMUNITY): Payer: Medicare Other

## 2022-08-22 DIAGNOSIS — G9341 Metabolic encephalopathy: Secondary | ICD-10-CM | POA: Diagnosis not present

## 2022-08-22 DIAGNOSIS — A419 Sepsis, unspecified organism: Secondary | ICD-10-CM | POA: Diagnosis not present

## 2022-08-22 DIAGNOSIS — G5132 Clonic hemifacial spasm, left: Secondary | ICD-10-CM | POA: Diagnosis not present

## 2022-08-22 DIAGNOSIS — F10939 Alcohol use, unspecified with withdrawal, unspecified: Secondary | ICD-10-CM | POA: Diagnosis not present

## 2022-08-22 LAB — PHOSPHORUS: Phosphorus: 3.7 mg/dL (ref 2.5–4.6)

## 2022-08-22 LAB — GLUCOSE, CAPILLARY
Glucose-Capillary: 112 mg/dL — ABNORMAL HIGH (ref 70–99)
Glucose-Capillary: 128 mg/dL — ABNORMAL HIGH (ref 70–99)
Glucose-Capillary: 78 mg/dL (ref 70–99)
Glucose-Capillary: 81 mg/dL (ref 70–99)
Glucose-Capillary: 94 mg/dL (ref 70–99)
Glucose-Capillary: 94 mg/dL (ref 70–99)

## 2022-08-22 LAB — BASIC METABOLIC PANEL
Anion gap: 14 (ref 5–15)
BUN: 11 mg/dL (ref 8–23)
CO2: 22 mmol/L (ref 22–32)
Calcium: 9 mg/dL (ref 8.9–10.3)
Chloride: 102 mmol/L (ref 98–111)
Creatinine, Ser: 0.51 mg/dL — ABNORMAL LOW (ref 0.61–1.24)
GFR, Estimated: >60 mL/min (ref >60)
Glucose, Bld: 121 mg/dL — ABNORMAL HIGH (ref 70–99)
Potassium: 4.3 mmol/L (ref 3.5–5.1)
Sodium: 138 mmol/L (ref 135–145)

## 2022-08-22 LAB — MAGNESIUM: Magnesium: 2.1 mg/dL (ref 1.7–2.4)

## 2022-08-22 MED ORDER — ADULT MULTIVITAMIN W/MINERALS CH: 1.0000 | ORAL_TABLET | Freq: Every day | ORAL | Status: DC

## 2022-08-22 MED ORDER — FOLIC ACID 1 MG PO TABS: 1.0000 mg | ORAL_TABLET | Freq: Every day | ORAL | Status: DC

## 2022-08-22 MED ORDER — METOPROLOL TARTRATE 25 MG PO TABS
25.0000 mg | ORAL_TABLET | Freq: Two times a day (BID) | ORAL | Status: DC
  Administered 2022-08-22 – 2022-08-25 (×7): 25 mg via ORAL
  Filled 2022-08-22 (×7): qty 1

## 2022-08-22 MED ORDER — FOOD THICKENER (SIMPLYTHICK): 10.0000 | ORAL | Status: DC | PRN

## 2022-08-22 MED ORDER — THIAMINE MONONITRATE 100 MG PO TABS
100.0000 mg | ORAL_TABLET | Freq: Every day | ORAL | Status: DC
  Administered 2022-08-23 – 2022-08-25 (×3): 100 mg via ORAL
  Filled 2022-08-22 (×3): qty 1

## 2022-08-22 MED ORDER — APIXABAN 5 MG PO TABS
5.0000 mg | ORAL_TABLET | Freq: Two times a day (BID) | ORAL | Status: DC
  Administered 2022-08-22 – 2022-08-25 (×6): 5 mg via ORAL
  Filled 2022-08-22 (×6): qty 1

## 2022-08-22 MED ORDER — APIXABAN 5 MG PO TABS: 5.0000 mg | ORAL_TABLET | Freq: Two times a day (BID) | ORAL | Status: DC

## 2022-08-22 MED ORDER — ADULT MULTIVITAMIN W/MINERALS CH
1.0000 | ORAL_TABLET | Freq: Every day | ORAL | Status: DC
  Administered 2022-08-23 – 2022-08-25 (×3): 1 via ORAL
  Filled 2022-08-22 (×3): qty 1

## 2022-08-22 MED ORDER — FOLIC ACID 1 MG PO TABS
1.0000 mg | ORAL_TABLET | Freq: Every day | ORAL | Status: DC
  Administered 2022-08-23 – 2022-08-25 (×3): 1 mg via ORAL
  Filled 2022-08-22 (×3): qty 1

## 2022-08-22 MED ORDER — THIAMINE MONONITRATE 100 MG PO TABS: 100.0000 mg | ORAL_TABLET | Freq: Every day | ORAL | Status: DC

## 2022-08-22 NOTE — Progress Notes (Signed)
PROGRESS NOTE        PATIENT DETAILS Name: James Ibarra Age: 72 y.o. Sex: male Date of Birth: [birth year] Admit Date: 08/12/2022 Admitting Physician Almon Hercules, MD ZOX:[id number], [name], MD  Brief Summary: Patient is a 72 y.o.  male with history of EtOH use, HTN, HLD-presented with fever/tachycardia-found to have PNA-Hospital course complicated by worsening hypoxemia requiring HFNC, A-fib RVR, and alcohol withdrawal/DTs.  Significant events: 6/8>> admit to [facility] with PNA 6/11>> worsening R-sided infiltrate, hypoxia and mental status, transfer to ICU on HFNC 6/12>> phenobarb taper added, confused, IV cardizem  6/14>> up walking in hallway, transferred to Progressive care.  Significant studies: 6/8>> CXR: Multifocal PNA 6/11>> CXR: Worsening of interstitial/patchy airspace opacities. 6/11>> CXR: Improved aeration of right lung base.  Significant microbiology data: 6/8>> COVID/influenza/RSV PCR: Negative 6/8>> blood culture: No growth 6/11>> respiratory virus panel: Rhinovirus  Procedures: None  Consults: PCCM  Subjective: Sleeping comfortably this morning when I saw him-subsequently later this morning-he was awake/alert-restraints were more removed-he was able to eat around 30-40% of his meals  Objective: Vitals: Blood pressure 133/82, pulse 82, temperature 98.6 F (37 C), temperature source Oral, resp. rate 18, height 6\' 2"  (1.88 m), weight 67 kg, SpO2 96 %.   Exam: Gen Exam:Alert awake-not in any distress HEENT:atraumatic, normocephalic Chest: B/L clear to auscultation anteriorly CVS:S1S2 regular Abdomen:soft non tender, non distended Extremities:no edema Neurology: Non focal Skin: no rash  Pertinent Labs/Radiology:    Latest Ref Rng & Units 08/20/2022    3:19 AM 08/18/2022   12:20 AM 08/17/2022   12:24 AM  CBC  WBC 4.0 - 10.5 K/uL 10.3  14.8  12.2   Hemoglobin 13.0 - 17.0 g/dL 40.9  81.1  91.4   Hematocrit 39.0 - 52.0 % 35.7  35.6   33.9   Platelets 150 - 400 K/uL 448  372  313     Lab Results  Component Value Date   NA 138 08/22/2022   K 4.3 08/22/2022   CL 102 08/22/2022   CO2 22 08/22/2022      Assessment/Plan: Acute hypoxic respiratory failure secondary to secondary to rhinovirus pneumonia and superimposed bacterial infection Clinically improved-on room air Has finished a course of antibiotics  Acute metabolic encephalopathy in the setting of hypoxia/PNA Alcohol withdrawal with severe DTs Slowly improving-much more awake and alert-no agitation per nursing staff-restraints were removed this morning.   Continue phenobarb taper-continue Ativan per CIWA protocol.  PAF Maintaining sinus rhythm Continue metoprolol Eliquis  Trigeminal neuralgia Stable Neurontin held due to encephalopathy-resume when able.  Oropharyngeal dysphagia Due to severe encephalopathy SLP following-dysphagia 2 diet Since eating-no longer requiring restraints-remove NG tube and see how he does.  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  Debility/deconditioning Due to acute illness SNF recommended  Nutrition Status: Nutrition Problem: Severe Malnutrition Etiology: chronic illness Signs/Symptoms: severe muscle depletion, severe fat depletion Interventions: Tube feeding, MVI  BMI: Estimated body mass index is 18.96 kg/m as calculated from the following:   Height as of this encounter: 6\' 2"  (1.88 m).   Weight as of this encounter: 67 kg.   Code status:   Code Status: Full Code   DVT Prophylaxis: apixaban (ELIQUIS) tablet 5 mg     Family Communication: None at bedside   Disposition Plan: Status is: Inpatient Remains inpatient appropriate because: Severity of  illness.   Planned Discharge Destination: SNF   Diet: Diet Order             DIET DYS 2 Room service appropriate? Yes; Fluid consistency: Nectar Thick  Diet effective now                      Antimicrobial agents: Anti-infectives (From admission, onward)    Start     Dose/Rate Route Frequency Ordered Stop   08/15/22 1200  vancomycin (VANCOCIN) IVPB 1000 mg/200 mL premix  Status:  Discontinued        1,000 mg 200 mL/hr over 60 Minutes Intravenous Every 12 hours 08/15/22 0214 08/15/22 1146   08/15/22 1200  ceFEPIme (MAXIPIME) 2 g in sodium chloride 0.9 % 100 mL IVPB        2 g 200 mL/hr over 30 Minutes Intravenous Every 8 hours 08/15/22 0404 08/17/22 2141   08/15/22 0500  ceFEPIme (MAXIPIME) 2 g in sodium chloride 0.9 % 100 mL IVPB        2 g 200 mL/hr over 30 Minutes Intravenous  Once 08/15/22 0404 08/16/22 1900   08/15/22 0300  vancomycin (VANCOREADY) IVPB 1500 mg/300 mL        1,500 mg 150 mL/hr over 120 Minutes Intravenous  Once 08/15/22 0201 08/15/22 0446   08/13/22 1600  azithromycin (ZITHROMAX) tablet 500 mg  Status:  Discontinued        500 mg Oral Daily 08/13/22 1109 08/17/22 1034   08/12/22 1445  cefTRIAXone (ROCEPHIN) 2 g in sodium chloride 0.9 % 100 mL IVPB  Status:  Discontinued        2 g 200 mL/hr over 30 Minutes Intravenous Every 24 hours 08/12/22 1430 08/15/22 0400   08/12/22 1445  azithromycin (ZITHROMAX) 500 mg in sodium chloride 0.9 % 250 mL IVPB  Status:  Discontinued        500 mg 250 mL/hr over 60 Minutes Intravenous Every 24 hours 08/12/22 1430 08/13/22 1108        MEDICATIONS: Scheduled Meds:  apixaban  5 mg Oral BID   Chlorhexidine Gluconate Cloth  6 each Topical Daily   Chlorhexidine Gluconate Cloth  6 each Topical Daily   feeding supplement (OSMOLITE 1.5 CAL)  1,000 mL Per Tube Q24H   feeding supplement (PROSource TF20)  60 mL Per Tube BID   [START ON 08/23/2022] folic acid  1 mg Oral Daily   metoprolol tartrate  25 mg Oral BID   [START ON 08/23/2022] multivitamin with minerals  1 tablet Oral Daily   sodium chloride flush  3 mL Intravenous Q12H   [START ON 08/23/2022] thiamine  100 mg Oral Daily   Continuous Infusions:  sodium  chloride Stopped (08/17/22 1434)   PRN Meds:.sodium chloride, food thickener, food thickener, guaiFENesin-dextromethorphan, hydrALAZINE, ipratropium-albuterol, LORazepam, LORazepam **OR** LORazepam, metoprolol tartrate, mouth rinse   I have personally reviewed following labs and imaging studies  LABORATORY DATA: CBC: Recent Labs  Lab 08/16/22 0412 08/17/22 0024 08/18/22 0020 08/20/22 0319  WBC 10.6* 12.2* 14.8* 10.3  NEUTROABS 8.1*  --   --   --   HGB 12.2* 11.5* 11.8* 12.2*  HCT 35.9* 33.9* 35.6* 35.7*  MCV 90.2 90.4 90.4 91.1  PLT 254 313 372 448*     Basic Metabolic Panel: Recent Labs  Lab 08/18/22 0652 08/18/22 1006 08/19/22 0553 08/19/22 1715 08/20/22 0319 08/20/22 1456 08/21/22 0316 08/22/22 0323  NA 136  --  136  --  136  --  140  138  K 4.2  --  3.2*  --  2.7* 4.2 4.0 4.3  CL 103  --  101  --  102  --  102 102  CO2 21*  --  23  --  24  --  22 22  GLUCOSE 99  --  98  --  110*  --  106* 121*  BUN 12  --  8  --  10  --  10 11  CREATININE 0.61  --  0.58*  --  0.57*  --  0.48* 0.51*  CALCIUM 8.2*  --  8.4*  --  8.4*  --  9.2 9.0  MG 2.0   < > 1.9 2.0 2.0  --  2.1 2.1  PHOS  --    < > 3.5 3.5 3.6  --  3.2 3.7   < > = values in this interval not displayed.     GFR: Estimated Creatinine Clearance: 80.3 mL/min (A) (by C-G formula based on SCr of 0.51 mg/dL (L)).  Liver Function Tests: Recent Labs  Lab 08/17/22 0024 08/18/22 0016 08/20/22 0319  AST 102* 119* 75*  ALT 90* 106* 105*  ALKPHOS 48 55 55  BILITOT 0.7 0.8 0.6  PROT 5.5* 5.8* 6.1*  ALBUMIN 2.2* 2.2* 2.4*    No results for input(s): "LIPASE", "AMYLASE" in the last 168 hours. No results for input(s): "AMMONIA" in the last 168 hours.  Coagulation Profile: No results for input(s): "INR", "PROTIME" in the last 168 hours.  Cardiac Enzymes: No results for input(s): "CKTOTAL", "CKMB", "CKMBINDEX", "TROPONINI" in the last 168 hours.  BNP (last 3 results) No results for input(s): "PROBNP" in the  last 8760 hours.  Lipid Profile: No results for input(s): "CHOL", "HDL", "LDLCALC", "TRIG", "CHOLHDL", "LDLDIRECT" in the last 72 hours.  Thyroid Function Tests: No results for input(s): "TSH", "T4TOTAL", "FREET4", "T3FREE", "THYROIDAB" in the last 72 hours.  Anemia Panel: No results for input(s): "VITAMINB12", "FOLATE", "FERRITIN", "TIBC", "IRON", "RETICCTPCT" in the last 72 hours.  Urine analysis:    Component Value Date/Time   COLORURINE AMBER (A) 08/13/2022 0930   APPEARANCEUR CLEAR 08/13/2022 0930   LABSPEC 1.026 08/13/2022 0930   PHURINE 6.0 08/13/2022 0930   GLUCOSEU NEGATIVE 08/13/2022 0930   HGBUR NEGATIVE 08/13/2022 0930   BILIRUBINUR NEGATIVE 08/13/2022 0930   KETONESUR NEGATIVE 08/13/2022 0930   PROTEINUR 30 (A) 08/13/2022 0930   NITRITE NEGATIVE 08/13/2022 0930   LEUKOCYTESUR NEGATIVE 08/13/2022 0930    Sepsis Labs: Lactic Acid, Venous    Component Value Date/Time   LATICACIDVEN 0.9 08/15/2022 0232    MICROBIOLOGY: Recent Results (from the past 240 hour(s))  Resp panel by RT-PCR (RSV, Flu A&B, Covid) Anterior Nasal Swab     Status: None   Collection Time: 08/12/22  2:30 PM   Specimen: Anterior Nasal Swab  Result Value Ref Range Status   SARS Coronavirus 2 by RT PCR NEGATIVE NEGATIVE Final   Influenza A by PCR NEGATIVE NEGATIVE Final   Influenza B by PCR NEGATIVE NEGATIVE Final    Comment: (NOTE) The Xpert Xpress SARS-CoV-2/FLU/RSV plus assay is intended as an aid in the diagnosis of influenza from Nasopharyngeal swab specimens and should not be used as a sole basis for treatment. Nasal washings and aspirates are unacceptable for Xpert Xpress SARS-CoV-2/FLU/RSV testing.  Fact Sheet for Patients: BloggerCourse.com  Fact Sheet for Healthcare Providers: SeriousBroker.it  This test is not yet approved or cleared by the [place] FDA and has been authorized for detection and/or diagnosis  of SARS-CoV-2  by FDA under an Emergency Use Authorization (EUA). This EUA will remain in effect (meaning this test can be used) for the duration of the COVID-19 declaration under Section 564(b)(1) of the Act, 21 U.S.C. section 360bbb-3(b)(1), unless the authorization is terminated or revoked.     Resp Syncytial Virus by PCR NEGATIVE NEGATIVE Final    Comment: (NOTE) Fact Sheet for Patients: BloggerCourse.com  Fact Sheet for Healthcare Providers: SeriousBroker.it  This test is not yet approved or cleared by the [place] FDA and has been authorized for detection and/or diagnosis of SARS-CoV-2 by FDA under an Emergency Use Authorization (EUA). This EUA will remain in effect (meaning this test can be used) for the duration of the COVID-19 declaration under Section 564(b)(1) of the Act, 21 U.S.C. section 360bbb-3(b)(1), unless the authorization is terminated or revoked.  Performed at [facility], [address], [place], [place] [address]   Blood Culture (routine x 2)     Status: None   Collection Time: 08/12/22  3:10 PM   Specimen: BLOOD  Result Value Ref Range Status   Specimen Description BLOOD LEFT ANTECUBITAL  Final   Special Requests   Final    BOTTLES DRAWN AEROBIC AND ANAEROBIC Blood Culture results may not be optimal due to an excessive volume of blood received in culture bottles   Culture   Final    NO GROWTH 5 DAYS Performed at [facility], [address], [place], [place] [address]    Report Status 08/17/2022 FINAL  Final  Blood Culture (routine x 2)     Status: None   Collection Time: 08/12/22  3:20 PM   Specimen: BLOOD RIGHT FOREARM  Result Value Ref Range Status   Specimen Description BLOOD RIGHT FOREARM  Final   Special Requests   Final    BOTTLES DRAWN AEROBIC AND ANAEROBIC Blood Culture adequate volume   Culture   Final    NO GROWTH 5 DAYS Performed at [facility], [address],  [place], [place] [address]    Report Status 08/17/2022 FINAL  Final  MRSA Next Gen by PCR, Nasal     Status: None   Collection Time: 08/15/22  2:11 AM   Specimen: Nasal Mucosa; Nasal Swab  Result Value Ref Range Status   MRSA by PCR Next Gen NOT DETECTED NOT DETECTED Final    Comment: (NOTE) The GeneXpert MRSA Assay (FDA approved for NASAL specimens only), is one component of a comprehensive MRSA colonization surveillance program. It is not intended to diagnose MRSA infection nor to guide or monitor treatment for MRSA infections. Test performance is not FDA approved in patients less than 90 years old. Performed at [facility], [address] [place], [place] [address]   Respiratory (~20 pathogens) panel by PCR     Status: Abnormal   Collection Time: 08/15/22  6:00 AM   Specimen: Nasopharyngeal Swab; Respiratory  Result Value Ref Range Status   Adenovirus NOT DETECTED NOT DETECTED Final   Coronavirus 229E NOT DETECTED NOT DETECTED Final    Comment: (NOTE) The Coronavirus on the Respiratory Panel, DOES NOT test for the novel  Coronavirus (2019 nCoV)    Coronavirus HKU1 NOT DETECTED NOT DETECTED Final   Coronavirus NL63 NOT DETECTED NOT DETECTED Final   Coronavirus OC43 NOT DETECTED NOT DETECTED Final   Metapneumovirus NOT DETECTED NOT DETECTED Final   Rhinovirus / Enterovirus DETECTED (A) NOT DETECTED Final   Influenza A NOT DETECTED  NOT DETECTED Final   Influenza B NOT DETECTED NOT DETECTED Final   Parainfluenza Virus 1 NOT DETECTED NOT DETECTED Final   Parainfluenza Virus 2 NOT DETECTED NOT DETECTED Final   Parainfluenza Virus 3 NOT DETECTED NOT DETECTED Final   Parainfluenza Virus 4 NOT DETECTED NOT DETECTED Final   Respiratory Syncytial Virus NOT DETECTED NOT DETECTED Final   Bordetella pertussis NOT DETECTED NOT DETECTED Final   Bordetella Parapertussis NOT DETECTED NOT DETECTED Final   Chlamydophila pneumoniae NOT DETECTED NOT DETECTED Final   Mycoplasma pneumoniae NOT  DETECTED NOT DETECTED Final    Comment: Performed at [facility], [address] [place], [place] [address]    RADIOLOGY STUDIES/RESULTS: No results found.   LOS: 9 days   Jeoffrey Massed, MD  [facility]    To contact the attending provider between 7A-7P or the covering provider during after hours 7P-7A, please log into the web site [email or web address] and access using universal [place] password for that web site. If you do not have the password, please call the hospital operator.  08/22/2022, 11:49 AM

## 2022-08-22 NOTE — Progress Notes (Deleted)
Cardiology Office Note   Date:  08/22/2022   ID:  James Ibarra, DOB May 30, 1950, MRN [id number]  PCP:  Farris Has, MD  Cardiologist:   None Referring:  ***  No chief complaint on file.     History of Present Illness: James Ibarra is a 72 y.o. male who is referred by *** for evaluation of MVP.  ***  He was found to have this in April.  This was thought to be severe with holosystolic prolapse of the posterior leaflet.    The EF was normal.  ***   Past Medical History:  Diagnosis Date   History of nonmelanoma skin cancer    Hypercholesteremia    Hypertension    Trigeminal neuralgia     Past Surgical History:  Procedure Laterality Date   HERNIA REPAIR  11/07/11   [facility]   INGUINAL HERNIA REPAIR  11/07/2011   Procedure: LAPAROSCOPIC INGUINAL HERNIA;  Surgeon: Atilano Ina, MD,FACS;  Location: [facility];  Service: General;  Laterality: Left;   RETINAL DETACHMENT SURGERY  1997   SKIN CANCER EXCISION  2012, 1999   basal cell - neck and hand     No current facility-administered medications for this visit.   No current outpatient medications on file.   Facility-Administered Medications Ordered in Other Visits  Medication Dose Route Frequency Provider Last Rate Last Admin   0.9 %  sodium chloride infusion   Intravenous PRN Icard, Bradley L, DO   Stopped at 08/17/22 1434   apixaban (ELIQUIS) tablet 5 mg  5 mg Oral BID Ghimire, Werner Lean, MD       Chlorhexidine Gluconate Cloth 2 % PADS 6 each  6 each Topical Daily Almon Hercules, MD   6 each at 08/21/22 0827   Chlorhexidine Gluconate Cloth 2 % PADS 6 each  6 each Topical Daily Almon Hercules, MD   6 each at 08/19/22 1008   feeding supplement (OSMOLITE 1.5 CAL) liquid 1,000 mL  1,000 mL Per Tube Q24H Maretta Bees, MD   1,000 mL at 08/21/22 1723   feeding supplement (PROSource TF20) liquid 60 mL  60 mL Per Tube BID Maretta Bees, MD   60 mL at 08/22/22 0903   [START ON 08/23/2022] folic acid (FOLVITE) tablet 1 mg  1 mg  Oral Daily Ghimire, Werner Lean, MD       food thickener (SIMPLYTHICK (HONEY/LEVEL 3/MODERATELY THICK)) 5 packet  5 packet Oral PRN Norton Blizzard, NP       food thickener (SIMPLYTHICK (NECTAR/LEVEL 2/MILDLY THICK)) 10 packet  10 packet Oral PRN Ghimire, Werner Lean, MD       guaiFENesin-dextromethorphan (ROBITUSSIN DM) 100-10 MG/5ML syrup 5 mL  5 mL Oral Q4H PRN Elgergawy, Leana Roe, MD       hydrALAZINE (APRESOLINE) injection 10 mg  10 mg Intravenous Q6H PRN Pecola Leisure, Stephanie M, PA-C       ipratropium-albuterol (DUONEB) 0.5-2.5 (3) MG/3ML nebulizer solution 3 mL  3 mL Nebulization Q6H PRN Elgergawy, Leana Roe, MD       LORazepam (ATIVAN) injection 1 mg  1 mg Intravenous Q4H PRN Selmer Dominion B, NP   1 mg at 08/20/22 0940   LORazepam (ATIVAN) tablet 1-4 mg  1-4 mg Oral Q1H PRN Elgergawy, Leana Roe, MD   3 mg at 08/22/22 0034   Or   LORazepam (ATIVAN) injection 1-4 mg  1-4 mg Intravenous Q1H PRN Elgergawy, Leana Roe, MD   2 mg at 08/21/22 2309  metoprolol tartrate (LOPRESSOR) injection 5 mg  5 mg Intravenous Q4H PRN Elgergawy, Leana Roe, MD       metoprolol tartrate (LOPRESSOR) tablet 25 mg  25 mg Oral BID Maretta Bees, MD   25 mg at 08/22/22 1147   [START ON 08/23/2022] multivitamin with minerals tablet 1 tablet  1 tablet Oral Daily Ghimire, Werner Lean, MD       Oral care mouth rinse  15 mL Mouth Rinse PRN Alanda Slim, Taye T, MD       sodium chloride flush (NS) 0.9 % injection 3 mL  3 mL Intravenous Q12H Synetta Fail, MD   3 mL at 08/22/22 0903   [START ON 08/23/2022] thiamine (VITAMIN B1) tablet 100 mg  100 mg Oral Daily Ghimire, Werner Lean, MD        Allergies:   Patient has no known allergies.    Social History:  The patient  reports that he has never smoked. He has never used smokeless tobacco. He reports current alcohol use. He reports that he does not use drugs.   Family History:  The patient's ***family history includes Heart disease in his father and mother.    ROS:  Please see the  history of present illness.   Otherwise, review of systems are positive for {NONE DEFAULTED:18576}.   All other systems are reviewed and negative.    PHYSICAL EXAM: VS:  There were no vitals taken for this visit. , BMI There is no height or weight on file to calculate BMI. GENERAL:  Well appearing HEENT:  Pupils equal round and reactive, fundi not visualized, oral mucosa unremarkable NECK:  No jugular venous distention, waveform within normal limits, carotid upstroke brisk and symmetric, no bruits, no thyromegaly LYMPHATICS:  No cervical, inguinal adenopathy LUNGS:  Clear to auscultation bilaterally BACK:  No CVA tenderness CHEST:  Unremarkable HEART:  PMI not displaced or sustained,S1 and S2 within normal limits, no S3, no S4, no clicks, no rubs, *** murmurs ABD:  Flat, positive bowel sounds normal in frequency in pitch, no bruits, no rebound, no guarding, no midline pulsatile mass, no hepatomegaly, no splenomegaly EXT:  2 plus pulses throughout, no edema, no cyanosis no clubbing SKIN:  No rashes no nodules NEURO:  Cranial nerves II through XII grossly intact, motor grossly intact throughout PSYCH:  Cognitively intact, oriented to person place and time    EKG:        Recent Labs: 08/15/2022: B Natriuretic Peptide 399.7 08/20/2022: ALT 105; Hemoglobin 12.2; Platelets 448 08/22/2022: BUN 11; Creatinine, Ser 0.51; Magnesium 2.1; Potassium 4.3; Sodium 138    Lipid Panel No results found for: "CHOL", "TRIG", "HDL", "CHOLHDL", "VLDL", "LDLCALC", "LDLDIRECT"    Wt Readings from Last 3 Encounters:  08/21/22 147 lb 11.3 oz (67 kg)  09/07/21 159 lb (72.1 kg)  06/08/21 159 lb 8 oz (72.3 kg)      Other studies Reviewed: Additional studies/ records that were reviewed today include: ***. Review of the above records demonstrates:  Please see elsewhere in the note.  ***   ASSESSMENT AND PLAN:  MVP:  ***   Current medicines are reviewed at length with the patient today.  The patient  {ACTIONS; HAS/DOES NOT HAVE:19233} concerns regarding medicines.  The following changes have been made:  {PLAN; NO CHANGE:13088:s}  Labs/ tests ordered today include: *** No orders of the defined types were placed in this encounter.    Disposition:   FU with ***    Signed, Rollene Rotunda, MD  08/22/2022 7:53 PM    [facility]

## 2022-08-22 NOTE — Progress Notes (Signed)
Physical Therapy Treatment Patient Details Name: James Ibarra MRN: [id number] DOB: 1950-10-13 Today's Date: 08/22/2022   History of Present Illness 72 y/o M adm 6/8 from home with weakness, severe sepsis 2/2 multifocal PNA. 6/11 transfer to ICU with hypoxemia and agitation. PMH includes trigeminal neuralgia, facial spasm, HTN and HLD    PT Comments    The patient received  curled up in bed, appears very weak, recently returned fro swallow study. Patient required Max assistance to mobilize to sitting on bed edge. Once sitting, able  to balance, stood with mod support at RW and shuffled a few steps. Continue PT. SPO2  93%  Recommendations for follow up therapy are one component of a multi-disciplinary discharge planning process, led by the attending physician.  Recommendations may be updated based on patient status, additional functional criteria and insurance authorization.  Follow Up Recommendations  Can patient physically be transported by private vehicle: No    Assistance Recommended at Discharge Frequent or constant Supervision/Assistance  Patient can return home with the following A lot of help with walking and/or transfers;A lot of help with bathing/dressing/bathroom;Assistance with cooking/housework;Direct supervision/assist for financial management;Assist for transportation;Assistance with feeding   Equipment Recommendations  Rolling walker (2 wheels)    Recommendations for Other Services       Precautions / Restrictions Precautions Precautions: Fall Precaution Comments: Mitts for restraints Restrictions Weight Bearing Restrictions: No     Mobility  Bed Mobility   Bed Mobility: Supine to Sit, Sit to Supine   Sidelying to sit: Max assist   Sit to supine: Max assist   General bed mobility comments: assistance for legs and trunk to roll and sit up onto bed edge, then return to supine    Transfers Overall transfer level: Needs assistance Equipment used: Rolling  walker (2 wheels) Transfers: Sit to/from Stand Sit to Stand: Mod assist           General transfer comment: mod assistance to power up to stand, able to side step x 4 along the bed.    Ambulation/Gait                   Stairs             Wheelchair Mobility    Modified Rankin (Stroke Patients Only)       Balance   Sitting-balance support: Feet supported Sitting balance-Leahy Scale: Fair Sitting balance - Comments: propped forward on  nees       Standing balance comment: RW in standing and min assist                            Cognition Arousal/Alertness: Lethargic Behavior During Therapy: Flat affect Overall Cognitive Status: No family/caregiver present to determine baseline cognitive functioning                     Current Attention Level: Focused Memory: Decreased short-term memory Following Commands: Follows one step commands inconsistently Safety/Judgement: Decreased awareness of deficits Awareness: Intellectual   General Comments: patient required much  cueing to  participate in mobility, Multimodal cues and tactile cues throughout. patient minimally responded to questions, not verbal.        Exercises      General Comments        Pertinent Vitals/Pain Pain Assessment Pain Assessment: No/denies pain    Home Living  Prior Function            PT Goals (current goals can now be found in the care plan section) Progress towards PT goals: Progressing toward goals    Frequency    Min 1X/week      PT Plan Current plan remains appropriate    Co-evaluation              AM-PAC PT "6 Clicks" Mobility   Outcome Measure  Help needed turning from your back to your side while in a flat bed without using bedrails?: A Lot Help needed moving from lying on your back to sitting on the side of a flat bed without using bedrails?: A Lot Help needed moving to and from a bed to  a chair (including a wheelchair)?: Total Help needed standing up from a chair using your arms (e.g., wheelchair or bedside chair)?: Total Help needed to walk in hospital room?: Total Help needed climbing 3-5 steps with a railing? : Total 6 Click Score: 8    End of Session Equipment Utilized During Treatment: Gait belt Activity Tolerance: Patient limited by fatigue Patient left: in bed;with call bell/phone within reach;with bed alarm set Nurse Communication: Mobility status PT Visit Diagnosis: Other abnormalities of gait and mobility (R26.89);Difficulty in walking, not elsewhere classified (R26.2);Muscle weakness (generalized) (M62.81)     Time: 1308-6578 PT Time Calculation (min) (ACUTE ONLY): 27 min  Charges:  $Therapeutic Activity: 23-37 mins                     Blanchard Kelch PT Acute Rehabilitation Services Office [phone] Weekend pager-[phone]    Rada Hay 08/22/2022, 2:17 PM

## 2022-08-22 NOTE — Plan of Care (Signed)
  Problem: Fluid Volume: Goal: Hemodynamic stability will improve Outcome: Progressing   Problem: Clinical Measurements: Goal: Diagnostic test results will improve Outcome: Progressing Goal: Signs and symptoms of infection will decrease Outcome: Progressing   Problem: Respiratory: Goal: Ability to maintain adequate ventilation will improve Outcome: Progressing   Problem: Education: Goal: Knowledge of General Education information will improve Description: Including pain rating scale, medication(s)/side effects and non-pharmacologic comfort measures Outcome: Progressing   Problem: Health Behavior/Discharge Planning: Goal: Ability to manage health-related needs will improve Outcome: Progressing   Problem: Clinical Measurements: Goal: Ability to maintain clinical measurements within normal limits will improve Outcome: Progressing Goal: Will remain free from infection Outcome: Progressing Goal: Diagnostic test results will improve Outcome: Progressing Goal: Respiratory complications will improve Outcome: Progressing Goal: Cardiovascular complication will be avoided Outcome: Progressing   Problem: Activity: Goal: Risk for activity intolerance will decrease Outcome: Progressing   Problem: Nutrition: Goal: Adequate nutrition will be maintained Outcome: Progressing   Problem: Coping: Goal: Level of anxiety will decrease Outcome: Progressing   Problem: Elimination: Goal: Will not experience complications related to bowel motility Outcome: Progressing Goal: Will not experience complications related to urinary retention Outcome: Progressing   Problem: Pain Managment: Goal: General experience of comfort will improve Outcome: Progressing   Problem: Safety: Goal: Ability to remain free from injury will improve Outcome: Progressing   Problem: Skin Integrity: Goal: Risk for impaired skin integrity will decrease Outcome: Progressing   Problem: Safety: Goal:  Non-violent Restraint(s) Outcome: Progressing   

## 2022-08-22 NOTE — Progress Notes (Signed)
2300 08/21/22 Ativan 1mg  IV order pulled in pyxis instead of 1-4mg  IV order. Per [facility] 2mg  due to give. Pyxis noted waste due because wrong order pulled. Pharmacy consulted and per pharmacy comment put in waste charting through pyxis. Please refer to [facility], correct dose was administered.

## 2022-08-22 NOTE — Progress Notes (Signed)
Modified Barium Swallow Study  Patient Details  Name: James Ibarra MRN: [id number] Date of Birth: Jun 21, 1950  Today's Date: 08/22/2022  Modified Barium Swallow completed.  Full report located under Chart Review in the Imaging Section.  History of Present Illness 72 y/o M adm 6/8 from home with weakness, severe sepsis 2/2 multifocal PNA, acute metabolic encephalopathy with alcohol withdrawal symptoms. 6/11 transfer to ICU with hypoxemia and agitation. PMH includes trigeminal neuralgia, facial spasm, HTN and HLD   Clinical Impression James Ibarra presents with a primary deficit in timing of the swallow response related to his mentation. Mechanics of the swallow appeared to be [facility] - mastication, pharyngeal stripping, UES patency were [facility]. He demonstrated moderate aspiration of thin liquids before the onset of the pharyngeal swallow - eliciting explosive coughing. There was adequate laryngeal vestibule closure, but as stated, timing of closure was dysynchronous with large sips of thin liquid.  Recommend starting dysphagia 2/nectar thick liquids for now.  SLP will follow and trial single/controlled sips of thin liquid at the bedside. Factors that may increase risk of adverse event in presence of aspiration James Ibarra & James Ibarra 2021): Reduced cognitive function  Swallow Evaluation Recommendations Recommendations: PO diet PO Diet Recommendation: Dysphagia 2 (Finely chopped);Mildly thick liquids (Level 2, nectar thick) Liquid Administration via: Cup;Straw Medication Administration: Whole meds with puree Supervision: Full assist for feeding Postural changes: Position pt fully upright for meals Oral care recommendations: Oral care BID (2x/day)    Damar Petit L. Samson Frederic, MA CCC/SLP Clinical Specialist - Acute Care SLP Acute Rehabilitation Services Office number [phone]   [name] [name] 08/22/2022,1:43 PM

## 2022-08-23 DIAGNOSIS — A419 Sepsis, unspecified organism: Secondary | ICD-10-CM | POA: Diagnosis not present

## 2022-08-23 DIAGNOSIS — E43 Unspecified severe protein-calorie malnutrition: Secondary | ICD-10-CM

## 2022-08-23 DIAGNOSIS — G9341 Metabolic encephalopathy: Secondary | ICD-10-CM | POA: Diagnosis not present

## 2022-08-23 DIAGNOSIS — F10939 Alcohol use, unspecified with withdrawal, unspecified: Secondary | ICD-10-CM | POA: Diagnosis not present

## 2022-08-23 LAB — BASIC METABOLIC PANEL
Anion gap: 11 (ref 5–15)
BUN: 12 mg/dL (ref 8–23)
CO2: 23 mmol/L (ref 22–32)
Calcium: 8.8 mg/dL — ABNORMAL LOW (ref 8.9–10.3)
Chloride: 103 mmol/L (ref 98–111)
Creatinine, Ser: 0.54 mg/dL — ABNORMAL LOW (ref 0.61–1.24)
GFR, Estimated: >60 mL/min (ref >60)
Glucose, Bld: 100 mg/dL — ABNORMAL HIGH (ref 70–99)
Potassium: 3.8 mmol/L (ref 3.5–5.1)
Sodium: 137 mmol/L (ref 135–145)

## 2022-08-23 LAB — GLUCOSE, CAPILLARY
Glucose-Capillary: 104 mg/dL — ABNORMAL HIGH (ref 70–99)
Glucose-Capillary: 104 mg/dL — ABNORMAL HIGH (ref 70–99)
Glucose-Capillary: 156 mg/dL — ABNORMAL HIGH (ref 70–99)
Glucose-Capillary: 89 mg/dL (ref 70–99)
Glucose-Capillary: 93 mg/dL (ref 70–99)
Glucose-Capillary: 96 mg/dL (ref 70–99)

## 2022-08-23 LAB — MAGNESIUM: Magnesium: 2.1 mg/dL (ref 1.7–2.4)

## 2022-08-23 LAB — PHOSPHORUS: Phosphorus: 3.3 mg/dL (ref 2.5–4.6)

## 2022-08-23 MED ORDER — MELATONIN 3 MG PO TABS
3.0000 mg | ORAL_TABLET | Freq: Every day | ORAL | Status: DC
  Administered 2022-08-23 – 2022-08-24 (×2): 3 mg via ORAL
  Filled 2022-08-23 (×2): qty 1

## 2022-08-23 MED ORDER — QUETIAPINE FUMARATE 25 MG PO TABS
25.0000 mg | ORAL_TABLET | Freq: Every day | ORAL | Status: DC
  Administered 2022-08-23 – 2022-08-24 (×2): 25 mg via ORAL
  Filled 2022-08-23 (×2): qty 1

## 2022-08-23 NOTE — NC FL2 (Signed)
[place] MEDICAID FL2 LEVEL OF CARE FORM     IDENTIFICATION  Patient Name: James Ibarra Birthdate: 04-04-1950 Sex: male Admission Date (Current Location): 08/12/2022  [facility] and [place] Number:  Producer, television/film/video and Address:  [facility] [place]. [facility], [address] [place], [place] [address]      Provider Number: [id number]  Attending Physician Name and Address:  Maretta Bees, MD  Relative Name and Phone Number:  [place], Aguayo Spouse   [phone]    Current Level of Care: Hospital Recommended Level of Care: Skilled Nursing Facility Prior Approval Number:    Date Approved/Denied:   PASRR Number: [id number]  Discharge Plan: SNF    Current Diagnoses: Patient Active Problem List   Diagnosis Date Noted   Protein-calorie malnutrition, severe 08/19/2022   Alcohol withdrawal (HCC) 08/14/2022   Acute metabolic encephalopathy 08/14/2022   Paroxysmal atrial fibrillation with RVR (HCC) 08/14/2022   Severe mitral valve regurgitation 08/14/2022   Mitral valve prolapse 08/14/2022   PNA (pneumonia) 08/12/2022   Severe sepsis (HCC) 08/12/2022   HLD (hyperlipidemia) 08/12/2022   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   Trigeminal neuralgia of left side of face 07/15/2019   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   Hypertension 10/31/2013   Pain in joint, shoulder region 04/14/2013   Nodule of right lung 11/07/2011    Orientation RESPIRATION BLADDER Height & Weight     Self, Place  Normal Incontinent Weight: 144 lb 2.9 oz (65.4 kg) Height:  6\' 2"  (188 cm)  BEHAVIORAL SYMPTOMS/MOOD NEUROLOGICAL BOWEL NUTRITION STATUS      Incontinent Diet (Please see discharge summary)  AMBULATORY STATUS COMMUNICATION OF NEEDS Skin   Extensive Assist Verbally Other (Comment) (Red spots on back)                       Personal Care Assistance Level of Assistance  Bathing, Feeding, Dressing Bathing Assistance: Maximum assistance Feeding  assistance: Maximum assistance Dressing Assistance: Maximum assistance     Functional Limitations Info  Sight, Hearing, Speech Sight Info: Impaired (Glasses) Hearing Info: Adequate Speech Info: Adequate    SPECIAL CARE FACTORS FREQUENCY  PT (By licensed PT), OT (By licensed OT)     PT Frequency: 5x weekly OT Frequency: 5x weekly            Contractures Contractures Info: Not present    Additional Factors Info  Code Status, Allergies (Full) Code Status Info: FULL Allergies Info: NKA Psychotropic Info: LORazepam (ATIVAN) tablet 0-4 mg every 6 hours,LORazepam (ATIVAN) tablet 0-4 mg every 12 hours         Current Medications (08/23/2022):  This is the current hospital active medication list Current Facility-Administered Medications  Medication Dose Route Frequency Provider Last Rate Last Admin   0.9 %  sodium chloride infusion   Intravenous PRN Icard, Bradley L, DO   Stopped at 08/17/22 1434   apixaban (ELIQUIS) tablet 5 mg  5 mg Oral BID Maretta Bees, MD   5 mg at 08/23/22 1054   folic acid (FOLVITE) tablet 1 mg  1 mg Oral Daily Maretta Bees, MD   1 mg at 08/23/22 1054   food thickener (SIMPLYTHICK (HONEY/LEVEL 3/MODERATELY THICK)) 5 packet  5 packet Oral PRN Norton Blizzard, NP       food thickener (SIMPLYTHICK (NECTAR/LEVEL 2/MILDLY THICK)) 10 packet  10 packet Oral PRN Ghimire, Werner Lean, MD       guaiFENesin-dextromethorphan (ROBITUSSIN DM)  100-10 MG/5ML syrup 5 mL  5 mL Oral Q4H PRN Elgergawy, Leana Roe, MD       hydrALAZINE (APRESOLINE) injection 10 mg  10 mg Intravenous Q6H PRN Pecola Leisure, Stephanie M, PA-C       ipratropium-albuterol (DUONEB) 0.5-2.5 (3) MG/3ML nebulizer solution 3 mL  3 mL Nebulization Q6H PRN Elgergawy, Leana Roe, MD       LORazepam (ATIVAN) injection 1 mg  1 mg Intravenous Q4H PRN Selmer Dominion B, NP   1 mg at 08/20/22 0940   melatonin tablet 3 mg  3 mg Oral QHS Ghimire, Shanker M, MD       metoprolol tartrate (LOPRESSOR) injection 5 mg  5  mg Intravenous Q4H PRN Elgergawy, Leana Roe, MD       metoprolol tartrate (LOPRESSOR) tablet 25 mg  25 mg Oral BID Maretta Bees, MD   25 mg at 08/23/22 1251   multivitamin with minerals tablet 1 tablet  1 tablet Oral Daily Maretta Bees, MD   1 tablet at 08/23/22 1054   Oral care mouth rinse  15 mL Mouth Rinse PRN Candelaria Stagers T, MD       QUEtiapine (SEROQUEL) tablet 25 mg  25 mg Oral QHS Ghimire, Shanker M, MD       sodium chloride flush (NS) 0.9 % injection 3 mL  3 mL Intravenous Q12H Synetta Fail, MD   3 mL at 08/23/22 1057   thiamine (VITAMIN B1) tablet 100 mg  100 mg Oral Daily Maretta Bees, MD   100 mg at 08/23/22 1054     Discharge Medications: Please see discharge summary for a list of discharge medications.  Relevant Imaging Results:  Relevant Lab Results:   Additional Information [id number]  Reva Bores, LCSWA

## 2022-08-23 NOTE — Progress Notes (Signed)
Nutrition Follow-up  DOCUMENTATION CODES:  Severe malnutrition in context of chronic illness  INTERVENTION:  Continue Folic acid, Thiamine, and Multivitamin w/ minerals daily Mighty Shake TID with meals, each supplement provides 330 kcals and 9 grams of protein Encourage good PO intake  NUTRITION DIAGNOSIS:  Severe Malnutrition related to chronic illness as evidenced by severe muscle depletion, severe fat depletion. - Ongoing  GOAL:  Patient will meet greater than or equal to 90% of their needs - Progressing   MONITOR:  PO intake, Supplement acceptance, Labs, I & O's  REASON FOR ASSESSMENT:  Consult Enteral/tube feeding initiation and management  ASSESSMENT:  72 y.o. male presented to the ED with fever, cough, and weakness. PMH includes HLD, HTN, facial spasm, EtOH abuse, and trigeminal neuralgia. Pt admitted with sepsis 2/2 pneumonia.    6/08 - Admitted; regular diet 6/11 - NPO; transferred to ICU 6/13 - diet advanced to dysphagia 1, honey thick liquids 6/14 - transferred to floor; Cortrak placed  6/18 - Cortrak removed; MBS, diet  advanced to Dysphagia 2, Nectar thick liquids  Pt in bed, wife at bedside. Pt much more awake and alert, able to recognize his wife. Wife reports that pt ate well for dinner and breakfast thus far. Likes chocolate Ensure, discussed that he is on nectar thick liquids and RD would send appropriate supplement on meal trays.   Meal Intake 6/13-6/14: 50-70% x2 meals 6/15-6/17: 60-80% x 3 meals  6/18-6/19: 35-100% x 4 meals  Medications reviewed and include: Folic acid, MVI, Thiamine  Labs reviewed: Sodium 140, Potassium 4.0, Phosphorus 3.2, Magnesium 2.1  CBG: 113-124 x 24 hrs  Diet Order:   Diet Order             DIET DYS 2 Room service appropriate? No; Fluid consistency: Nectar Thick  Diet effective now                  EDUCATION NEEDS: No education needs have been identified at this time  Skin:  Skin Assessment: Reviewed RN  Assessment  Last BM:  6/18  Height:  Ht Readings from Last 1 Encounters:  08/23/22 6\' 2"  (1.88 m)   Weight:  Wt Readings from Last 1 Encounters:  08/23/22 65.4 kg   Ideal Body Weight:  86.4 kg  BMI:  Body mass index is 18.51 kg/m.  Estimated Nutritional Needs:  Kcal:  1900-2100 Protein:  95-115 grams Fluid:  >/= 1.9 L   Kirby Crigler RD, LDN Clinical Dietitian See [facility] for contact information.

## 2022-08-23 NOTE — Progress Notes (Signed)
PROGRESS NOTE        PATIENT DETAILS Name: James Ibarra Age: 72 y.o. Sex: male Date of Birth: 1950-05-14 Admit Date: 08/12/2022 Admitting Physician Almon Hercules, MD [id number], [name], MD  Brief Summary: Patient is a 72 y.o.  male with history of EtOH use, HTN, HLD-presented with fever/tachycardia-found to have PNA-Hospital course complicated by worsening hypoxemia requiring HFNC, A-fib RVR, and alcohol withdrawal/DTs.  Significant events: 6/8>> admit to [facility] with PNA 6/11>> worsening R-sided infiltrate, hypoxia and mental status, transfer to ICU on HFNC 6/12>> phenobarb taper added, confused, IV cardizem  6/14>> up walking in hallway, transferred to [facility] care.  Significant studies: 6/8>> CXR: Multifocal PNA 6/11>> CXR: Worsening of interstitial/patchy airspace opacities. 6/11>> CXR: Improved aeration of right lung base.  Significant microbiology data: 6/8>> COVID/influenza/RSV PCR: Negative 6/8>> blood culture: No growth 6/11>> respiratory virus panel: Rhinovirus  Procedures: None  Consults: PCCM  Subjective: No major issues overnight-slightly confused this morning-but easily redirectable-and just waking up.  Objective: Vitals: Blood pressure 135/77, pulse 71, temperature 98.4 F (36.9 C), temperature source Oral, resp. rate 17, height 6\' 2"  (1.88 m), weight 65.4 kg, SpO2 96 %.   Exam: Gen Exam:not in any distress HEENT:atraumatic, normocephalic Chest: B/L clear to auscultation anteriorly CVS:S1S2 regular Abdomen:soft non tender, non distended Extremities:no edema Neurology: Non focal Skin: no rash  Pertinent Labs/Radiology:    Latest Ref Rng & Units 08/20/2022    3:19 AM 08/18/2022   12:20 AM 08/17/2022   12:24 AM  CBC  WBC 4.0 - 10.5 K/uL 10.3  14.8  12.2   Hemoglobin 13.0 - 17.0 g/dL 40.9  81.1  91.4   Hematocrit 39.0 - 52.0 % 35.7  35.6  33.9   Platelets 150 - 400 K/uL 448  372  313     Lab Results  Component Value  Date   NA 137 08/23/2022   K 3.8 08/23/2022   CL 103 08/23/2022   CO2 23 08/23/2022      Assessment/Plan: Acute hypoxic respiratory failure secondary to secondary to rhinovirus pneumonia and superimposed bacterial infection Clinically improved-on room air Has finished a course of antibiotics  Acute metabolic encephalopathy in the setting of hypoxia/PNA Alcohol withdrawal with severe DTs Has significantly improved over the past several days Has completed phenobarb taper-no longer on Ativan per CIWA protocol  Some mild confusion this morning/overnight-but easily redirectable Maintain delirium precautions Will try low-dose Seroquel tonight.  PAF Maintaining sinus rhythm Continue metoprolol/Eliquis  Trigeminal neuralgia Stable Neurontin held due to encephalopathy-resume when able.  Oropharyngeal dysphagia Due to severe encephalopathy SLP following-dysphagia 2 diet Since eating-no longer requiring restraints-remove NG tube and see how he does.  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  Debility/deconditioning Due to acute illness SNF recommended  Nutrition Status: Nutrition Problem: Severe Malnutrition Etiology: chronic illness Signs/Symptoms: severe muscle depletion, severe fat depletion Interventions: Tube feeding, MVI  BMI: Estimated body mass index is 18.51 kg/m as calculated from the following:   Height as of this encounter: 6\' 2"  (1.88 m).   Weight as of this encounter: 65.4 kg.   Code status:   Code Status: Full Code   DVT Prophylaxis: apixaban (ELIQUIS) tablet 5 mg     Family Communication: Spouse at bedside on 6/17, 6/18-none at bedside this morning.   Disposition Plan: Status is: Inpatient Remains inpatient appropriate  because: Severity of illness.   Planned Discharge Destination: SNF   Diet: Diet Order             DIET DYS 2 Room service appropriate? No; Fluid consistency: Nectar Thick  Diet  effective now                     Antimicrobial agents: Anti-infectives (From admission, onward)    Start     Dose/Rate Route Frequency Ordered Stop   08/15/22 1200  vancomycin (VANCOCIN) IVPB 1000 mg/200 mL premix  Status:  Discontinued        1,000 mg 200 mL/hr over 60 Minutes Intravenous Every 12 hours 08/15/22 0214 08/15/22 1146   08/15/22 1200  ceFEPIme (MAXIPIME) 2 g in sodium chloride 0.9 % 100 mL IVPB        2 g 200 mL/hr over 30 Minutes Intravenous Every 8 hours 08/15/22 0404 08/17/22 2141   08/15/22 0500  ceFEPIme (MAXIPIME) 2 g in sodium chloride 0.9 % 100 mL IVPB        2 g 200 mL/hr over 30 Minutes Intravenous  Once 08/15/22 0404 08/16/22 1900   08/15/22 0300  vancomycin (VANCOREADY) IVPB 1500 mg/300 mL        1,500 mg 150 mL/hr over 120 Minutes Intravenous  Once 08/15/22 0201 08/15/22 0446   08/13/22 1600  azithromycin (ZITHROMAX) tablet 500 mg  Status:  Discontinued        500 mg Oral Daily 08/13/22 1109 08/17/22 1034   08/12/22 1445  cefTRIAXone (ROCEPHIN) 2 g in sodium chloride 0.9 % 100 mL IVPB  Status:  Discontinued        2 g 200 mL/hr over 30 Minutes Intravenous Every 24 hours 08/12/22 1430 08/15/22 0400   08/12/22 1445  azithromycin (ZITHROMAX) 500 mg in sodium chloride 0.9 % 250 mL IVPB  Status:  Discontinued        500 mg 250 mL/hr over 60 Minutes Intravenous Every 24 hours 08/12/22 1430 08/13/22 1108        MEDICATIONS: Scheduled Meds:  apixaban  5 mg Oral BID   folic acid  1 mg Oral Daily   melatonin  3 mg Oral QHS   metoprolol tartrate  25 mg Oral BID   multivitamin with minerals  1 tablet Oral Daily   QUEtiapine  25 mg Oral QHS   sodium chloride flush  3 mL Intravenous Q12H   thiamine  100 mg Oral Daily   Continuous Infusions:  sodium chloride Stopped (08/17/22 1434)   PRN Meds:.sodium chloride, food thickener, food thickener, guaiFENesin-dextromethorphan, hydrALAZINE, ipratropium-albuterol, LORazepam, metoprolol tartrate, mouth  rinse   I have personally reviewed following labs and imaging studies  LABORATORY DATA: CBC: Recent Labs  Lab 08/17/22 0024 08/18/22 0020 08/20/22 0319  WBC 12.2* 14.8* 10.3  HGB 11.5* 11.8* 12.2*  HCT 33.9* 35.6* 35.7*  MCV 90.4 90.4 91.1  PLT 313 372 448*     Basic Metabolic Panel: Recent Labs  Lab 08/19/22 0553 08/19/22 1715 08/20/22 0319 08/20/22 1456 08/21/22 0316 08/22/22 0323 08/23/22 0215  NA 136  --  136  --  140 138 137  K 3.2*  --  2.7* 4.2 4.0 4.3 3.8  CL 101  --  102  --  102 102 103  CO2 23  --  24  --  22 22 23   GLUCOSE 98  --  110*  --  106* 121* 100*  BUN 8  --  10  --  10  11 12  CREATININE 0.58*  --  0.57*  --  0.48* 0.51* 0.54*  CALCIUM 8.4*  --  8.4*  --  9.2 9.0 8.8*  MG 1.9 2.0 2.0  --  2.1 2.1 2.1  PHOS 3.5 3.5 3.6  --  3.2 3.7 3.3     GFR: Estimated Creatinine Clearance: 78.3 mL/min (A) (by C-G formula based on SCr of 0.54 mg/dL (L)).  Liver Function Tests: Recent Labs  Lab 08/17/22 0024 08/18/22 0016 08/20/22 0319  AST 102* 119* 75*  ALT 90* 106* 105*  ALKPHOS 48 55 55  BILITOT 0.7 0.8 0.6  PROT 5.5* 5.8* 6.1*  ALBUMIN 2.2* 2.2* 2.4*    No results for input(s): "LIPASE", "AMYLASE" in the last 168 hours. No results for input(s): "AMMONIA" in the last 168 hours.  Coagulation Profile: No results for input(s): "INR", "PROTIME" in the last 168 hours.  Cardiac Enzymes: No results for input(s): "CKTOTAL", "CKMB", "CKMBINDEX", "TROPONINI" in the last 168 hours.  BNP (last 3 results) No results for input(s): "PROBNP" in the last 8760 hours.  Lipid Profile: No results for input(s): "CHOL", "HDL", "LDLCALC", "TRIG", "CHOLHDL", "LDLDIRECT" in the last 72 hours.  Thyroid Function Tests: No results for input(s): "TSH", "T4TOTAL", "FREET4", "T3FREE", "THYROIDAB" in the last 72 hours.  Anemia Panel: No results for input(s): "VITAMINB12", "FOLATE", "FERRITIN", "TIBC", "IRON", "RETICCTPCT" in the last 72 hours.  Urine analysis:     Component Value Date/Time   COLORURINE AMBER (A) 08/13/2022 0930   APPEARANCEUR CLEAR 08/13/2022 0930   LABSPEC 1.026 08/13/2022 0930   PHURINE 6.0 08/13/2022 0930   GLUCOSEU NEGATIVE 08/13/2022 0930   HGBUR NEGATIVE 08/13/2022 0930   BILIRUBINUR NEGATIVE 08/13/2022 0930   KETONESUR NEGATIVE 08/13/2022 0930   PROTEINUR 30 (A) 08/13/2022 0930   NITRITE NEGATIVE 08/13/2022 0930   LEUKOCYTESUR NEGATIVE 08/13/2022 0930    Sepsis Labs: Lactic Acid, Venous    Component Value Date/Time   LATICACIDVEN 0.9 08/15/2022 0232    MICROBIOLOGY: Recent Results (from the past 240 hour(s))  MRSA Next Gen by PCR, Nasal     Status: None   Collection Time: 08/15/22  2:11 AM   Specimen: Nasal Mucosa; Nasal Swab  Result Value Ref Range Status   MRSA by PCR Next Gen NOT DETECTED NOT DETECTED Final    Comment: (NOTE) The GeneXpert MRSA Assay (FDA approved for NASAL specimens only), is one component of a comprehensive MRSA colonization surveillance program. It is not intended to diagnose MRSA infection nor to guide or monitor treatment for MRSA infections. Test performance is not FDA approved in patients less than 64 years old. Performed at [facility], [address] [place], [place] [address]   Respiratory (~20 pathogens) panel by PCR     Status: Abnormal   Collection Time: 08/15/22  6:00 AM   Specimen: Nasopharyngeal Swab; Respiratory  Result Value Ref Range Status   Adenovirus NOT DETECTED NOT DETECTED Final   Coronavirus 229E NOT DETECTED NOT DETECTED Final    Comment: (NOTE) The Coronavirus on the Respiratory Panel, DOES NOT test for the novel  Coronavirus (2019 nCoV)    Coronavirus HKU1 NOT DETECTED NOT DETECTED Final   Coronavirus NL63 NOT DETECTED NOT DETECTED Final   Coronavirus OC43 NOT DETECTED NOT DETECTED Final   Metapneumovirus NOT DETECTED NOT DETECTED Final   Rhinovirus / Enterovirus DETECTED (A) NOT DETECTED Final   Influenza A NOT DETECTED NOT DETECTED Final    Influenza B NOT DETECTED NOT DETECTED Final  Parainfluenza Virus 1 NOT DETECTED NOT DETECTED Final   Parainfluenza Virus 2 NOT DETECTED NOT DETECTED Final   Parainfluenza Virus 3 NOT DETECTED NOT DETECTED Final   Parainfluenza Virus 4 NOT DETECTED NOT DETECTED Final   Respiratory Syncytial Virus NOT DETECTED NOT DETECTED Final   Bordetella pertussis NOT DETECTED NOT DETECTED Final   Bordetella Parapertussis NOT DETECTED NOT DETECTED Final   Chlamydophila pneumoniae NOT DETECTED NOT DETECTED Final   Mycoplasma pneumoniae NOT DETECTED NOT DETECTED Final    Comment: Performed at [facility], [address] [place], [place] [address]    RADIOLOGY STUDIES/RESULTS: DG Swallowing Func-Speech Pathology  Result Date: 08/22/2022 Table formatting from the original result was not included. Modified Barium Swallow Study Patient Details Name: James Ibarra MRN: [id number] Date of Birth: 1950-11-01 Today's Date: 08/22/2022 HPI/PMH: HPI: 72 y/o M adm 6/8 from home with weakness, severe sepsis 2/2 multifocal PNA, acute metabolic encephalopathy with alcohol withdrawal symptoms. 6/11 transfer to ICU with hypoxemia and agitation. PMH includes trigeminal neuralgia, facial spasm, HTN and HLD Clinical Impression: Clinical Impression: James Ibarra presents with a primary deficit in timing of the swallow response related to his mentation. Mechanics of the swallow appeared to be [facility] - mastication, pharyngeal stripping, UES patency were [facility]. He demonstrated moderate aspiration of thin liquids before the onset of the pharyngeal swallow - eliciting explosive coughing. There was adequate laryngeal vestibule closure, but as stated, timing of closure was dysynchronous with large sips of thin liquid.  Recommend starting dysphagia 2/nectar thick liquids for now.  SLP will follow and trial single/controlled sips of thin liquid at the bedside. Factors that may increase risk of adverse event in presence of aspiration Rubye Oaks &  Clearance Coots 2021): Factors that may increase risk of adverse event in presence of aspiration Rubye Oaks & Clearance Coots 2021): Reduced cognitive function Recommendations/Plan: Swallowing Evaluation Recommendations Swallowing Evaluation Recommendations Recommendations: PO diet PO Diet Recommendation: Dysphagia 2 (Finely chopped); Mildly thick liquids (Level 2, nectar thick) Liquid Administration via: Cup; Straw Medication Administration: Whole meds with puree Supervision: Full assist for feeding Postural changes: Position pt fully upright for meals Oral care recommendations: Oral care BID (2x/day) Treatment Plan Treatment Plan Treatment recommendations: Therapy as outlined in treatment plan below Follow-up recommendations: Skilled nursing-short term rehab (<3 hours/day) Functional status assessment: Patient has had a recent decline in their functional status and demonstrates the ability to make significant improvements in function in a reasonable and predictable amount of time. Treatment frequency: Min 2x/week Treatment duration: 1 week Interventions: Trials of upgraded texture/liquids; Patient/family education; Diet toleration management by SLP Recommendations Recommendations for follow up therapy are one component of a multi-disciplinary discharge planning process, led by the attending physician.  Recommendations may be updated based on patient status, additional functional criteria and insurance authorization. Assessment: Orofacial Exam: Orofacial Exam Oral Cavity - Dentition: Adequate natural dentition Orofacial Anatomy: WFL Oral Motor/Sensory Function: WFL Anatomy: Anatomy: WFL Boluses Administered: Boluses Administered Boluses Administered: Thin liquids (Level 0); Mildly thick liquids (Level 2, nectar thick); Puree; Solid  Oral Impairment Domain: Oral Impairment Domain Lip Closure: No labial escape Tongue control during bolus hold: Cohesive bolus between tongue to palatal seal Bolus preparation/mastication: Slow prolonged  chewing/mashing with complete recollection Bolus transport/lingual motion: Brisk tongue motion Oral residue: Complete oral clearance Location of oral residue : N/A Initiation of pharyngeal swallow : Pyriform sinuses  Pharyngeal Impairment Domain: Pharyngeal Impairment Domain Soft palate elevation: No bolus between soft palate (SP)/pharyngeal wall (PW) Laryngeal elevation: Complete superior movement of  thyroid cartilage with complete approximation of arytenoids to epiglottic petiole Anterior hyoid excursion: Complete anterior movement Epiglottic movement: Complete inversion Laryngeal vestibule closure: Incomplete, narrow column air/contrast in laryngeal vestibule Pharyngeal stripping wave : Present - complete Pharyngeal contraction (A/P view only): N/A Pharyngoesophageal segment opening: Complete distension and complete duration, no obstruction of flow Tongue base retraction: No contrast between tongue base and posterior pharyngeal wall (PPW) Pharyngeal residue: Complete pharyngeal clearance Location of pharyngeal residue: N/A  Esophageal Impairment Domain: Esophageal Impairment Domain Esophageal clearance upright position: -- (n/a) Pill: Pill Consistency administered: -- (NT) Penetration/Aspiration Scale Score: No data recorded Compensatory Strategies: Compensatory Strategies Compensatory strategies: Yes Straw: -- (no change with or without straw)   General Information: Caregiver present: No  Diet Prior to this Study: Dysphagia 1 (pureed); Moderately thick liquids (Level 3, honey thick)   Temperature : Normal   Respiratory Status: WFL   Supplemental O2: None (Room air)   History of Recent Intubation: No  Behavior/Cognition: Lethargic/Drowsy Self-Feeding Abilities: Needs assist with self-feeding Baseline vocal quality/speech: Normal Volitional Cough: Able to elicit Volitional Swallow: Unable to elicit No data recorded Goal Planning: Prognosis for improved oropharyngeal function: Good Barriers to Reach Goals:  Cognitive deficits No data recorded No data recorded Consulted and agree with results and recommendations: Pt unable/family or caregiver not available Pain: Pain Assessment Pain Assessment: No/denies pain Faces Pain Scale: 0 Facial Expression: 1 Body Movements: 1 Muscle Tension: 1 Compliance with ventilator (intubated pts.): N/A Vocalization (extubated pts.): N/A CPOT Total: 3 End of Session: Start Time:SLP Start Time (ACUTE ONLY): 1035 Stop Time: SLP Stop Time (ACUTE ONLY): 1100 Time Calculation:SLP Time Calculation (min) (ACUTE ONLY): 25 min Charges: SLP Evaluations $ SLP Speech Visit: 1 Visit SLP Evaluations $MBS Swallow: 1 Procedure $Swallowing Treatment: 1 Procedure SLP visit diagnosis: SLP Visit Diagnosis: Dysphagia, oropharyngeal phase (R13.12) Past Medical History: Past Medical History: Diagnosis Date  History of nonmelanoma skin cancer   Hypercholesteremia   Hypertension   Trigeminal neuralgia  Past Surgical History: Past Surgical History: Procedure Laterality Date  HERNIA REPAIR  11/07/11  [facility]  INGUINAL HERNIA REPAIR  11/07/2011  Procedure: LAPAROSCOPIC INGUINAL HERNIA;  Surgeon: Atilano Ina, MD,FACS;  Location: [facility];  Service: General;  Laterality: Left;  RETINAL DETACHMENT SURGERY  1997  SKIN CANCER EXCISION  2012, 1999  basal cell - neck and hand [name] [name] 08/22/2022, 1:44 PM    LOS: 10 days   Jeoffrey Massed, MD  [facility]    To contact the attending provider between 7A-7P or the covering provider during after hours 7P-7A, please log into the web site [email or web address] and access using universal [place] password for that web site. If you do not have the password, please call the hospital operator.  08/23/2022, 9:29 AM

## 2022-08-23 NOTE — Progress Notes (Signed)
Occupational Therapy Treatment Patient Details Name: James Ibarra MRN: [id number] DOB: Mar 21, 1950 Today's Date: 08/23/2022   History of present illness 72 y/o M adm 6/8 from home with weakness, severe sepsis 2/2 multifocal PNA. 6/11 transfer to ICU with hypoxemia and agitation. PMH includes trigeminal neuralgia, facial spasm, HTN and HLD   OT comments  Pt supine in bed with HOB elevated upon OT arrival. Pt agreeable to participation in skilled OT session. Pt presents AAOx4 and with increased functional level and improved cognition compared to last skilled OT session on 6/13. Pt participated well throughout skilled OT session with pt reporting he feels his thinking is clearer this day. Pt currently demonstrates ability to complete UB ADLs with Set up to Min-Mod assist, LB ADLs with Mod assist, and functional transfers with a RW (2 wheel) with Min assist of 1. Pt continues to present with decreased activity tolerance and requires cues for safety, sequencing, and compensatory techniques. Recommended discharge plan, OT goals, and OT frequency updated on this day. Pt will benefit from continued acute skilled OT services. Post acute discharge, pt will benefit from intensive inpatient skilled OT services >3 hours per day to maximize rehab potential.    Recommendations for follow up therapy are one component of a multi-disciplinary discharge planning process, led by the attending physician.  Recommendations may be updated based on patient status, additional functional criteria and insurance authorization.    Assistance Recommended at Discharge Frequent or constant Supervision/Assistance  Patient can return home with the following  A little help with walking and/or transfers;A lot of help with bathing/dressing/bathroom;Assistance with cooking/housework;Direct supervision/assist for medications management;Direct supervision/assist for financial management;Assist for transportation;Help with stairs or ramp for  entrance   Equipment Recommendations  Other (comment) (defer to next level of care)    Recommendations for Other Services Rehab consult    Precautions / Restrictions Precautions Precautions: Fall Precaution Comments: Mitts for restraints (Per pt's RN, mitts have not been needed this day and no need for mitts during OT session this day.) Restrictions Weight Bearing Restrictions: No       Mobility Bed Mobility Overal bed mobility: Needs Assistance Bed Mobility: Supine to Sit, Sit to Supine, Rolling Rolling: Supervision (with cues for technique)   Supine to sit: Supervision, HOB elevated (with cues for hand placement, technique, and sequencing) Sit to supine: Min guard, HOB elevated (with cues for hand placement, technique, and sequencing)        Transfers Overall transfer level: Needs assistance Equipment used: Rolling walker (2 wheels) Transfers: Sit to/from Stand, Bed to chair/wheelchair/BSC Sit to Stand: Min assist     Step pivot transfers: Min assist     General transfer comment: requires cues for hand placement, technique, safety, and sequencing     Balance Overall balance assessment: Needs assistance Sitting-balance support: Single extremity supported, No upper extremity supported, Feet supported Sitting balance-Leahy Scale: Fair     Standing balance support: Single extremity supported, Bilateral upper extremity supported, During functional activity, Reliant on assistive device for balance Standing balance-Leahy Scale: Poor                             ADL either performed or assessed with clinical judgement   ADL Overall ADL's : Needs assistance/impaired Eating/Feeding: Set up;Sitting   Grooming: Supervision/safety;Cueing for sequencing;Sitting   Upper Body Bathing: Minimal assistance;Moderate assistance;Cueing for sequencing;Cueing for compensatory techniques;Sitting   Lower Body Bathing: Moderate assistance;Cueing for safety;Cueing for  sequencing;Cueing for compensatory  techniques;Sit to/from stand   Upper Body Dressing : Minimal assistance   Lower Body Dressing: Moderate assistance;Cueing for safety;Cueing for sequencing;Cueing for compensatory techniques;Sit to/from stand   Toilet Transfer: Minimal assistance;Cueing for safety;Cueing for sequencing;Rolling walker (2 wheels);BSC/3in1 (Step-pivot)   Toileting- Clothing Manipulation and Hygiene: Moderate assistance;Cueing for safety;Cueing for sequencing;Sit to/from stand         General ADL Comments: Pt presents with decreased endurance.    Extremity/Trunk Assessment Upper Extremity Assessment Upper Extremity Assessment: Generalized weakness            Vision       Perception     Praxis      Cognition Arousal/Alertness: Awake/alert Behavior During Therapy: Flat affect Overall Cognitive Status: Impaired/Different from baseline Area of Impairment: Attention, Memory, Following commands, Safety/judgement, Problem solving                   Current Attention Level: Selective Memory: Decreased short-term memory Following Commands: Follows one step commands consistently Safety/Judgement: Decreased awareness of safety, Decreased awareness of deficits Awareness: Emergent Problem Solving: Slow processing, Difficulty sequencing, Requires verbal cues General Comments: Pt AAOx4 this day.        Exercises      Shoulder Instructions       General Comments VSS on RA throughout session. Pt reports he feels his thinking is clearer this day. Pt participated well in skilled OT session.    Pertinent Vitals/ Pain       Pain Assessment Pain Assessment: Faces Faces Pain Scale: Hurts a little bit Pain Location: generalized Pain Descriptors / Indicators: Discomfort, Grimacing, Aching Pain Intervention(s): Limited activity within patient's tolerance, Monitored during session, Repositioned  Home Living                                           Prior Functioning/Environment              Frequency  Min 2X/week        Progress Toward Goals  OT Goals(current goals can now be found in the care plan section)  Progress towards OT goals: Progressing toward goals;Goals met and updated - see care plan  Acute Rehab OT Goals Patient Stated Goal: To return home withhis wife and dog OT Goal Formulation: With patient Time For Goal Achievement: 09/06/22 Potential to Achieve Goals: Good ADL Goals Pt Will Perform Grooming: with supervision;standing (completing 3 tasks) Pt Will Perform Lower Body Bathing: with supervision;sit to/from stand Pt Will Perform Upper Body Dressing: with modified independence;sitting Pt Will Perform Lower Body Dressing: with supervision;sit to/from stand Pt Will Transfer to Toilet: with supervision;ambulating;regular height toilet;grab bars (with least restrictive AD) Pt Will Perform Toileting - Clothing Manipulation and hygiene: with supervision;sit to/from stand  Plan Discharge plan needs to be updated;Frequency needs to be updated;Other (comment) (Goals need to be updated)    Co-evaluation                 AM-PAC OT "6 Clicks" Daily Activity     Outcome Measure   Help from another person eating meals?: A Little (Set up) Help from another person taking care of personal grooming?: A Little Help from another person toileting, which includes using toliet, bedpan, or urinal?: A Lot Help from another person bathing (including washing, rinsing, drying)?: A Lot Help from another person to put on and taking off regular upper body clothing?: A Little  Help from another person to put on and taking off regular lower body clothing?: A Lot 6 Click Score: 15    End of Session Equipment Utilized During Treatment: Gait belt;Rolling walker (2 wheels);Other (comment) (BSC)  OT Visit Diagnosis: Unsteadiness on feet (R26.81);Other abnormalities of gait and mobility (R26.89);Muscle weakness  (generalized) (M62.81)   Activity Tolerance Patient limited by fatigue   Patient Left in bed;with call bell/phone within reach;with bed alarm set   Nurse Communication Mobility status;Other (comment) (Pt had a BM. Pt VSS on RA throughout session.)        Time: 4098-1191 OT Time Calculation (min): 43 min  Charges: OT General Charges $OT Visit: 1 Visit OT Treatments $Self Care/Home Management : 38-52 mins  Rebekkah Powless "Orson Eva., OTR/L, MA Acute Rehab [phone]   Lendon Colonel 08/23/2022, 2:56 PM

## 2022-08-24 ENCOUNTER — Ambulatory Visit: Payer: Medicare Other | Admitting: Cardiology

## 2022-08-24 DIAGNOSIS — A419 Sepsis, unspecified organism: Secondary | ICD-10-CM | POA: Diagnosis not present

## 2022-08-24 DIAGNOSIS — E43 Unspecified severe protein-calorie malnutrition: Secondary | ICD-10-CM | POA: Diagnosis not present

## 2022-08-24 DIAGNOSIS — G9341 Metabolic encephalopathy: Secondary | ICD-10-CM | POA: Diagnosis not present

## 2022-08-24 DIAGNOSIS — F10939 Alcohol use, unspecified with withdrawal, unspecified: Secondary | ICD-10-CM | POA: Diagnosis not present

## 2022-08-24 MED ORDER — GABAPENTIN 100 MG PO CAPS
200.0000 mg | ORAL_CAPSULE | Freq: Two times a day (BID) | ORAL | Status: DC
  Administered 2022-08-24 – 2022-08-25 (×3): 200 mg via ORAL
  Filled 2022-08-24 (×3): qty 2

## 2022-08-24 NOTE — Progress Notes (Signed)
PROGRESS NOTE        PATIENT DETAILS Name: James Ibarra Age: 71 y.o. Sex: male Date of Birth: 07/01/1950 Admit Date: 08/12/2022 Admitting Physician Almon Hercules, MD ZOX:[id number], James Custard, MD  Brief Summary: Patient is a 72 y.o.  male with history of EtOH use, HTN, HLD-presented with fever/tachycardia-found to have PNA-Hospital course complicated by worsening hypoxemia requiring HFNC, A-fib RVR, and alcohol withdrawal/DTs.  Significant events: 6/8>> admit to [facility] with PNA 6/11>> worsening R-sided infiltrate, hypoxia and mental status, transfer to ICU on HFNC 6/12>> phenobarb taper added, confused, IV cardizem  6/14>> up walking in hallway, transferred to Progressive care.  Significant studies: 6/8>> CXR: Multifocal PNA 6/11>> CXR: Worsening of interstitial/patchy airspace opacities. 6/11>> CXR: Improved aeration of right lung base.  Significant microbiology data: 6/8>> COVID/influenza/RSV PCR: Negative 6/8>> blood culture: No growth 6/11>> respiratory virus panel: Rhinovirus  Procedures: None  Consults: PCCM  Subjective: Completely awake/alert-no major issues overnight.  Gets tearful-when he starts talking about his alcohol use-claims that he will not use alcohol any further.  Objective: Vitals: Blood pressure 122/86, pulse 69, temperature 98.3 F (36.8 C), temperature source Oral, resp. rate 20, height 6\' 2"  (1.88 m), weight 65.4 kg, SpO2 94 %.   Exam: Gen Exam:Alert awake-not in any distress HEENT:atraumatic, normocephalic Chest: B/L clear to auscultation anteriorly CVS:S1S2 regular Abdomen:soft non tender, non distended Extremities:no edema Neurology: Non focal Skin: no rash  Pertinent Labs/Radiology:    Latest Ref Rng & Units 08/20/2022    3:19 AM 08/18/2022   12:20 AM 08/17/2022   12:24 AM  CBC  WBC 4.0 - 10.5 K/uL 10.3  14.8  12.2   Hemoglobin 13.0 - 17.0 g/dL 40.9  81.1  91.4   Hematocrit 39.0 - 52.0 % 35.7  35.6  33.9    Platelets 150 - 400 K/uL 448  372  313     Lab Results  Component Value Date   NA 137 08/23/2022   K 3.8 08/23/2022   CL 103 08/23/2022   CO2 23 08/23/2022      Assessment/Plan: Acute hypoxic respiratory failure secondary to secondary to rhinovirus pneumonia and superimposed bacterial infection Clinically improved-on room air Has finished a course of antibiotics  Acute metabolic encephalopathy in the setting of hypoxia/PNA Alcohol withdrawal with severe DTs Mentation significantly better-in fact he is completely awake/alert Uneventful last night Continue low-dose Seroquel/melatonin.  PAF Maintaining sinus rhythm Continue metoprolol/Eliquis  Trigeminal neuralgia Stable Neurontin held due to encephalopathy-will resume today-claims he only was taking it twice daily dosing.    Oropharyngeal dysphagia Due to severe encephalopathy SLP following-dysphagia 2 diet-which she seems to be tolerating.  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  Debility/deconditioning Due to acute illness SNF recommended  Nutrition Status: Nutrition Problem: Severe Malnutrition Etiology: chronic illness Signs/Symptoms: severe muscle depletion, severe fat depletion Interventions: Hormel Shake, MVI  BMI: Estimated body mass index is 18.51 kg/m as calculated from the following:   Height as of this encounter: 6\' 2"  (1.88 m).   Weight as of this encounter: 65.4 kg.   Code status:   Code Status: Full Code   DVT Prophylaxis: apixaban (ELIQUIS) tablet 5 mg     Family Communication: Spouse at bedside on 6/19.     Disposition Plan: Status is: Inpatient Remains inpatient appropriate  because: Severity of illness.   Planned Discharge Destination: SNF-medically stable for discharge.   Diet: Diet Order             DIET DYS 2 Room service appropriate? No; Fluid consistency: Nectar Thick  Diet  effective now                     Antimicrobial agents: Anti-infectives (From admission, onward)    Start     Dose/Rate Route Frequency Ordered Stop   08/15/22 1200  vancomycin (VANCOCIN) IVPB 1000 mg/200 mL premix  Status:  Discontinued        1,000 mg 200 mL/hr over 60 Minutes Intravenous Every 12 hours 08/15/22 0214 08/15/22 1146   08/15/22 1200  ceFEPIme (MAXIPIME) 2 g in sodium chloride 0.9 % 100 mL IVPB        2 g 200 mL/hr over 30 Minutes Intravenous Every 8 hours 08/15/22 0404 08/17/22 2141   08/15/22 0500  ceFEPIme (MAXIPIME) 2 g in sodium chloride 0.9 % 100 mL IVPB        2 g 200 mL/hr over 30 Minutes Intravenous  Once 08/15/22 0404 08/16/22 1900   08/15/22 0300  vancomycin (VANCOREADY) IVPB 1500 mg/300 mL        1,500 mg 150 mL/hr over 120 Minutes Intravenous  Once 08/15/22 0201 08/15/22 0446   08/13/22 1600  azithromycin (ZITHROMAX) tablet 500 mg  Status:  Discontinued        500 mg Oral Daily 08/13/22 1109 08/17/22 1034   08/12/22 1445  cefTRIAXone (ROCEPHIN) 2 g in sodium chloride 0.9 % 100 mL IVPB  Status:  Discontinued        2 g 200 mL/hr over 30 Minutes Intravenous Every 24 hours 08/12/22 1430 08/15/22 0400   08/12/22 1445  azithromycin (ZITHROMAX) 500 mg in sodium chloride 0.9 % 250 mL IVPB  Status:  Discontinued        500 mg 250 mL/hr over 60 Minutes Intravenous Every 24 hours 08/12/22 1430 08/13/22 1108        MEDICATIONS: Scheduled Meds:  apixaban  5 mg Oral BID   folic acid  1 mg Oral Daily   melatonin  3 mg Oral QHS   metoprolol tartrate  25 mg Oral BID   multivitamin with minerals  1 tablet Oral Daily   QUEtiapine  25 mg Oral QHS   sodium chloride flush  3 mL Intravenous Q12H   thiamine  100 mg Oral Daily   Continuous Infusions:  sodium chloride Stopped (08/17/22 1434)   PRN Meds:.sodium chloride, food thickener, food thickener, guaiFENesin-dextromethorphan, hydrALAZINE, ipratropium-albuterol, LORazepam, metoprolol tartrate, mouth  rinse   I have personally reviewed following labs and imaging studies  LABORATORY DATA: CBC: Recent Labs  Lab 08/18/22 0020 08/20/22 0319  WBC 14.8* 10.3  HGB 11.8* 12.2*  HCT 35.6* 35.7*  MCV 90.4 91.1  PLT 372 448*     Basic Metabolic Panel: Recent Labs  Lab 08/19/22 0553 08/19/22 1715 08/20/22 0319 08/20/22 1456 08/21/22 0316 08/22/22 0323 08/23/22 0215  NA 136  --  136  --  140 138 137  K 3.2*  --  2.7* 4.2 4.0 4.3 3.8  CL 101  --  102  --  102 102 103  CO2 23  --  24  --  22 22 23   GLUCOSE 98  --  110*  --  106* 121* 100*  BUN 8  --  10  --  10 11 12   CREATININE  0.58*  --  0.57*  --  0.48* 0.51* 0.54*  CALCIUM 8.4*  --  8.4*  --  9.2 9.0 8.8*  MG 1.9 2.0 2.0  --  2.1 2.1 2.1  PHOS 3.5 3.5 3.6  --  3.2 3.7 3.3     GFR: Estimated Creatinine Clearance: 78.3 mL/min (A) (by C-G formula based on SCr of 0.54 mg/dL (L)).  Liver Function Tests: Recent Labs  Lab 08/18/22 0016 08/20/22 0319  AST 119* 75*  ALT 106* 105*  ALKPHOS 55 55  BILITOT 0.8 0.6  PROT 5.8* 6.1*  ALBUMIN 2.2* 2.4*    No results for input(s): "LIPASE", "AMYLASE" in the last 168 hours. No results for input(s): "AMMONIA" in the last 168 hours.  Coagulation Profile: No results for input(s): "INR", "PROTIME" in the last 168 hours.  Cardiac Enzymes: No results for input(s): "CKTOTAL", "CKMB", "CKMBINDEX", "TROPONINI" in the last 168 hours.  BNP (last 3 results) No results for input(s): "PROBNP" in the last 8760 hours.  Lipid Profile: No results for input(s): "CHOL", "HDL", "LDLCALC", "TRIG", "CHOLHDL", "LDLDIRECT" in the last 72 hours.  Thyroid Function Tests: No results for input(s): "TSH", "T4TOTAL", "FREET4", "T3FREE", "THYROIDAB" in the last 72 hours.  Anemia Panel: No results for input(s): "VITAMINB12", "FOLATE", "FERRITIN", "TIBC", "IRON", "RETICCTPCT" in the last 72 hours.  Urine analysis:    Component Value Date/Time   COLORURINE AMBER (A) 08/13/2022 0930    APPEARANCEUR CLEAR 08/13/2022 0930   LABSPEC 1.026 08/13/2022 0930   PHURINE 6.0 08/13/2022 0930   GLUCOSEU NEGATIVE 08/13/2022 0930   HGBUR NEGATIVE 08/13/2022 0930   BILIRUBINUR NEGATIVE 08/13/2022 0930   KETONESUR NEGATIVE 08/13/2022 0930   PROTEINUR 30 (A) 08/13/2022 0930   NITRITE NEGATIVE 08/13/2022 0930   LEUKOCYTESUR NEGATIVE 08/13/2022 0930    Sepsis Labs: Lactic Acid, Venous    Component Value Date/Time   LATICACIDVEN 0.9 08/15/2022 0232    MICROBIOLOGY: Recent Results (from the past 240 hour(s))  MRSA Next Gen by PCR, Nasal     Status: None   Collection Time: 08/15/22  2:11 AM   Specimen: Nasal Mucosa; Nasal Swab  Result Value Ref Range Status   MRSA by PCR Next Gen NOT DETECTED NOT DETECTED Final    Comment: (NOTE) The GeneXpert MRSA Assay (FDA approved for NASAL specimens only), is one component of a comprehensive MRSA colonization surveillance program. It is not intended to diagnose MRSA infection nor to guide or monitor treatment for MRSA infections. Test performance is not FDA approved in patients less than 48 years old. Performed at [facility], [address], [place], [place] [address]   Respiratory (~20 pathogens) panel by PCR     Status: Abnormal   Collection Time: 08/15/22  6:00 AM   Specimen: Nasopharyngeal Swab; Respiratory  Result Value Ref Range Status   Adenovirus NOT DETECTED NOT DETECTED Final   Coronavirus 229E NOT DETECTED NOT DETECTED Final    Comment: (NOTE) The Coronavirus on the Respiratory Panel, DOES NOT test for the novel  Coronavirus (2019 nCoV)    Coronavirus HKU1 NOT DETECTED NOT DETECTED Final   Coronavirus NL63 NOT DETECTED NOT DETECTED Final   Coronavirus OC43 NOT DETECTED NOT DETECTED Final   Metapneumovirus NOT DETECTED NOT DETECTED Final   Rhinovirus / Enterovirus DETECTED (A) NOT DETECTED Final   Influenza A NOT DETECTED NOT DETECTED Final   Influenza B NOT DETECTED NOT DETECTED Final   Parainfluenza Virus 1  NOT DETECTED NOT DETECTED Final   Parainfluenza Virus  2 NOT DETECTED NOT DETECTED Final   Parainfluenza Virus 3 NOT DETECTED NOT DETECTED Final   Parainfluenza Virus 4 NOT DETECTED NOT DETECTED Final   Respiratory Syncytial Virus NOT DETECTED NOT DETECTED Final   Bordetella pertussis NOT DETECTED NOT DETECTED Final   Bordetella Parapertussis NOT DETECTED NOT DETECTED Final   Chlamydophila pneumoniae NOT DETECTED NOT DETECTED Final   Mycoplasma pneumoniae NOT DETECTED NOT DETECTED Final    Comment: Performed at [facility], [address] [place], [place] [address]    RADIOLOGY STUDIES/RESULTS: DG Swallowing Func-Speech Pathology  Result Date: 08/22/2022 Table formatting from the original result was not included. Modified Barium Swallow Study Patient Details Name: James Ibarra MRN: [id number] Date of Birth: 1951/02/23 Today's Date: 08/22/2022 HPI/PMH: HPI: 72 y/o M adm 6/8 from home with weakness, severe sepsis 2/2 multifocal PNA, acute metabolic encephalopathy with alcohol withdrawal symptoms. 6/11 transfer to ICU with hypoxemia and agitation. PMH includes trigeminal neuralgia, facial spasm, HTN and HLD Clinical Impression: Clinical Impression: James Ibarra presents with a primary deficit in timing of the swallow response related to his mentation. Mechanics of the swallow appeared to be [facility] - mastication, pharyngeal stripping, UES patency were [facility]. He demonstrated moderate aspiration of thin liquids before the onset of the pharyngeal swallow - eliciting explosive coughing. There was adequate laryngeal vestibule closure, but as stated, timing of closure was dysynchronous with large sips of thin liquid.  Recommend starting dysphagia 2/nectar thick liquids for now.  SLP will follow and trial single/controlled sips of thin liquid at the bedside. Factors that may increase risk of adverse event in presence of aspiration Rubye Oaks & Clearance Coots 2021): Factors that may increase risk of adverse event in presence  of aspiration Rubye Oaks & Clearance Coots 2021): Reduced cognitive function Recommendations/Plan: Swallowing Evaluation Recommendations Swallowing Evaluation Recommendations Recommendations: PO diet PO Diet Recommendation: Dysphagia 2 (Finely chopped); Mildly thick liquids (Level 2, nectar thick) Liquid Administration via: Cup; Straw Medication Administration: Whole meds with puree Supervision: Full assist for feeding Postural changes: Position pt fully upright for meals Oral care recommendations: Oral care BID (2x/day) Treatment Plan Treatment Plan Treatment recommendations: Therapy as outlined in treatment plan below Follow-up recommendations: Skilled nursing-short term rehab (<3 hours/day) Functional status assessment: Patient has had a recent decline in their functional status and demonstrates the ability to make significant improvements in function in a reasonable and predictable amount of time. Treatment frequency: Min 2x/week Treatment duration: 1 week Interventions: Trials of upgraded texture/liquids; Patient/family education; Diet toleration management by SLP Recommendations Recommendations for follow up therapy are one component of a multi-disciplinary discharge planning process, led by the attending physician.  Recommendations may be updated based on patient status, additional functional criteria and insurance authorization. Assessment: Orofacial Exam: Orofacial Exam Oral Cavity - Dentition: Adequate natural dentition Orofacial Anatomy: WFL Oral Motor/Sensory Function: WFL Anatomy: Anatomy: WFL Boluses Administered: Boluses Administered Boluses Administered: Thin liquids (Level 0); Mildly thick liquids (Level 2, nectar thick); Puree; Solid  Oral Impairment Domain: Oral Impairment Domain Lip Closure: No labial escape Tongue control during bolus hold: Cohesive bolus between tongue to palatal seal Bolus preparation/mastication: Slow prolonged chewing/mashing with complete recollection Bolus transport/lingual motion:  Brisk tongue motion Oral residue: Complete oral clearance Location of oral residue : N/A Initiation of pharyngeal swallow : Pyriform sinuses  Pharyngeal Impairment Domain: Pharyngeal Impairment Domain Soft palate elevation: No bolus between soft palate (SP)/pharyngeal wall (PW) Laryngeal elevation: Complete superior movement of thyroid cartilage with complete approximation of arytenoids to epiglottic petiole Anterior hyoid  excursion: Complete anterior movement Epiglottic movement: Complete inversion Laryngeal vestibule closure: Incomplete, narrow column air/contrast in laryngeal vestibule Pharyngeal stripping wave : Present - complete Pharyngeal contraction (A/P view only): N/A Pharyngoesophageal segment opening: Complete distension and complete duration, no obstruction of flow Tongue base retraction: No contrast between tongue base and posterior pharyngeal wall (PPW) Pharyngeal residue: Complete pharyngeal clearance Location of pharyngeal residue: N/A  Esophageal Impairment Domain: Esophageal Impairment Domain Esophageal clearance upright position: -- (n/a) Pill: Pill Consistency administered: -- (NT) Penetration/Aspiration Scale Score: No data recorded Compensatory Strategies: Compensatory Strategies Compensatory strategies: Yes Straw: -- (no change with or without straw)   General Information: Caregiver present: No  Diet Prior to this Study: Dysphagia 1 (pureed); Moderately thick liquids (Level 3, honey thick)   Temperature : Normal   Respiratory Status: WFL   Supplemental O2: None (Room air)   History of Recent Intubation: No  Behavior/Cognition: Lethargic/Drowsy Self-Feeding Abilities: Needs assist with self-feeding Baseline vocal quality/speech: Normal Volitional Cough: Able to elicit Volitional Swallow: Unable to elicit No data recorded Goal Planning: Prognosis for improved oropharyngeal function: Good Barriers to Reach Goals: Cognitive deficits No data recorded No data recorded Consulted and agree with  results and recommendations: Pt unable/family or caregiver not available Pain: Pain Assessment Pain Assessment: No/denies pain Faces Pain Scale: 0 Facial Expression: 1 Body Movements: 1 Muscle Tension: 1 Compliance with ventilator (intubated pts.): N/A Vocalization (extubated pts.): N/A CPOT Total: 3 End of Session: Start Time:SLP Start Time (ACUTE ONLY): 1035 Stop Time: SLP Stop Time (ACUTE ONLY): 1100 Time Calculation:SLP Time Calculation (min) (ACUTE ONLY): 25 min Charges: SLP Evaluations $ SLP Speech Visit: 1 Visit SLP Evaluations $MBS Swallow: 1 Procedure $Swallowing Treatment: 1 Procedure SLP visit diagnosis: SLP Visit Diagnosis: Dysphagia, oropharyngeal phase (R13.12) Past Medical History: Past Medical History: Diagnosis Date  History of nonmelanoma skin cancer   Hypercholesteremia   Hypertension   Trigeminal neuralgia  Past Surgical History: Past Surgical History: Procedure Laterality Date  HERNIA REPAIR  11/07/11  [facility]  INGUINAL HERNIA REPAIR  11/07/2011  Procedure: LAPAROSCOPIC INGUINAL HERNIA;  Surgeon: Atilano Ina, MD,FACS;  Location: [facility]S;  Service: General;  Laterality: Left;  RETINAL DETACHMENT SURGERY  1997  SKIN CANCER EXCISION  2012, 1999  basal cell - neck and hand [name] [name] 08/22/2022, 1:44 PM    LOS: 11 days   Jeoffrey Massed, MD  [facility]    To contact the attending provider between 7A-7P or the covering provider during after hours 7P-7A, please log into the web site [email or web address] and access using universal [place] password for that web site. If you do not have the password, please call the hospital operator.  08/24/2022, 9:42 AM

## 2022-08-24 NOTE — Progress Notes (Signed)
Speech Language Pathology Treatment: Dysphagia  Patient Details Name: James Ibarra MRN: [id number] DOB: 1950/11/08 Today's Date: 08/24/2022 Time: 0955-1010 SLP Time Calculation (min) (ACUTE ONLY): 15 min  Assessment / Plan / Recommendation Clinical Impression  Patient seen by SLP for skilled treatment focused on dysphagia goals. When SLP arrived, he was sitting up in recliner, had just finished session with PT walking in hallway. Cortrak was removed on 6/18. Patient told SLP that the solid foods have been good but the thickened liquids "tastes like dog". His voice was clear and strong but he reported it was hoarse compared to his baseline. SLP reviewed MBS reports from 6/18 which stated patient had sensed aspiration with thin liquids when taking large sip secondary to mistimed laryngeal vestibule closure suspected to be due to mentation. During today's session, as patient appears significantly improved with his alertness and mentation (still with some confusion), thin liquids were attempted. He was able to manage without assistance, taking cup sips of thin water. Swallow initiation appeared timely and no overt s/s aspiration observed. Patient drank entire cup (approximately 6 ounces) of water without any coughing. At this time, SLP is recommending to advance liquids to thin consistency and solids to Dys 3 (mechanical soft) consistency. SLP will follow for toleration.   HPI HPI: 72 y/o M adm 6/8 from home with weakness, severe sepsis 2/2 multifocal PNA, acute metabolic encephalopathy with alcohol withdrawal symptoms. 6/11 transfer to ICU with hypoxemia and agitation. PMH includes trigeminal neuralgia, facial spasm, HTN and HLD      SLP Plan  Continue with current plan of care      Recommendations for follow up therapy are one component of a multi-disciplinary discharge planning process, led by the attending physician.  Recommendations may be updated based on patient status, additional functional  criteria and insurance authorization.    Recommendations  Diet recommendations: Dysphagia 3 (mechanical soft);Thin liquid Liquids provided via: Cup;Straw Medication Administration: Whole meds with liquid Supervision: Patient able to self feed;Intermittent supervision to cue for compensatory strategies Compensations: Minimize environmental distractions;Small sips/bites Postural Changes and/or Swallow Maneuvers: Seated upright 90 degrees                  Oral care BID   Set up Supervision/Assistance Dysphagia, oropharyngeal phase (R13.12)     Continue with current plan of care    Angela Nevin, MA, CCC-SLP Speech Therapy

## 2022-08-24 NOTE — Plan of Care (Signed)
  Problem: Education: Goal: Knowledge of General Education information will improve Description: Including pain rating scale, medication(s)/side effects and non-pharmacologic comfort measures Outcome: Progressing   Problem: Health Behavior/Discharge Planning: Goal: Ability to manage health-related needs will improve Outcome: Progressing   Problem: Activity: Goal: Risk for activity intolerance will decrease Outcome: Progressing   Problem: Elimination: Goal: Will not experience complications related to bowel motility Outcome: Progressing   

## 2022-08-24 NOTE — TOC Progression Note (Addendum)
Transition of Care [facility]) - Progression Note    Patient Details  Name: DARVEL DANCEL MRN: [id number] Date of Birth: 1950-05-12  [facility] CM/SW Contact  Mearl Latin, LCSW Phone Number: 08/24/2022, 1:47 PM  Clinical Narrative:    1:47 PM-CSW left voicemail for patient's spouse.   2:45pm-CSW contacted spouse again. CSW discussed discharge plan with her. She stated she can be with patient all the time (she is 20 and can take off work) but she is nervous due to their size difference. She requested CSW try to see if insurance will pay for SNF. CSW provided SNF bed offers. She has selected [facility]. CSW reached out to [facility] and confirmed bed. CSW initiated insurance process, Ref# [id number].   If SNF is not approved, spouse is agreeable to home health services. CSW also emailed her a listing of out of pocket private duty nursing agencies as requested ([email or web address]).       Barriers to Discharge: Continued Medical Work up  Expected Discharge Plan and Services In-house Referral: Clinical Social Work     Living arrangements for the past 2 months: Single Family Home                                       Social Determinants of Health (SDOH) Interventions SDOH Screenings   Food Insecurity: No Food Insecurity (08/12/2022)  Housing: Low Risk  (08/12/2022)  Transportation Needs: No Transportation Needs (08/12/2022)  Utilities: Not At Risk (08/12/2022)  Tobacco Use: Low Risk  (08/12/2022)    Readmission Risk Interventions     No data to display

## 2022-08-24 NOTE — Progress Notes (Signed)
Physical Therapy Treatment Patient Details Name: James Ibarra MRN: [id number] DOB: Aug 03, 1950 Today's Date: 08/24/2022   History of Present Illness 72 y/o M adm 6/8 from home with weakness, severe sepsis 2/2 multifocal PNA. 6/11 transfer to ICU with hypoxemia and agitation. PMH includes trigeminal neuralgia, facial spasm, HTN and HLD    PT Comments    Pt tolerated today's session well, making good progress with therapy. Pt able to ambulate ~150 feet with RW and minG for safety. Pt progressing from minA to minG for sit<>stand transfers, good recall of proper technique and form with repetition. Acute PT will continue to follow up with pt as appropriate to progress mobility and maximize independence, discharge recommendations updated to HHPT pending continued progression during admission.     Recommendations for follow up therapy are one component of a multi-disciplinary discharge planning process, led by the attending physician.  Recommendations may be updated based on patient status, additional functional criteria and insurance authorization.  Follow Up Recommendations  Can patient physically be transported by private vehicle: Yes    Assistance Recommended at Discharge Frequent or constant Supervision/Assistance  Patient can return home with the following Assistance with cooking/housework;Direct supervision/assist for financial management;Assist for transportation;A little help with walking and/or transfers;Help with stairs or ramp for entrance;A little help with bathing/dressing/bathroom   Equipment Recommendations  Rolling walker (2 wheels)    Recommendations for Other Services       Precautions / Restrictions Precautions Precautions: Fall Restrictions Weight Bearing Restrictions: No     Mobility  Bed Mobility Overal bed mobility: Needs Assistance Bed Mobility: Supine to Sit     Supine to sit: Supervision, HOB elevated     General bed mobility comments: supervision for  safety and line management    Transfers Overall transfer level: Needs assistance Equipment used: Rolling walker (2 wheels) Transfers: Sit to/from Stand Sit to Stand: Min assist, Min guard           General transfer comment: cueing for proper hand placement but good recall with repetition. Progressing from minA initially to minG after repetition and proper form    Ambulation/Gait Ambulation/Gait assistance: Min guard Gait Distance (Feet): 150 Feet Assistive device: Rolling walker (2 wheels) Gait Pattern/deviations: Trunk flexed, Step-through pattern, Decreased stride length Gait velocity: decreased     General Gait Details: pt with trunk flexion and downward gaze, correcting with cues but reverting back with fatigue. MinG provided for safety and mild cues for RW management with turns, no overt LOB   Stairs             Wheelchair Mobility    Modified Rankin (Stroke Patients Only)       Balance Overall balance assessment: Needs assistance Sitting-balance support: No upper extremity supported, Feet supported Sitting balance-Leahy Scale: Fair     Standing balance support: Bilateral upper extremity supported, During functional activity, Reliant on assistive device for balance Standing balance-Leahy Scale: Poor Standing balance comment: reliant on RW for ambulation                            Cognition Arousal/Alertness: Awake/alert Behavior During Therapy: Flat affect Overall Cognitive Status: Impaired/Different from baseline Area of Impairment: Attention, Following commands, Safety/judgement, Problem solving                   Current Attention Level: Alternating   Following Commands: Follows one step commands consistently Safety/Judgement: Decreased awareness of safety, Decreased awareness of deficits Awareness:  Emergent Problem Solving: Slow processing, Requires verbal cues General Comments: pt pleasant throughout session, following  commands consistently with mild cueing for safety throughout        Exercises Other Exercises Other Exercises: sit<>stand x5 reps from bed    General Comments General comments (skin integrity, edema, etc.): SPO2 at 92% or greater on room air throughout session      Pertinent Vitals/Pain Pain Assessment Pain Assessment: Faces Faces Pain Scale: No hurt    Home Living                          Prior Function            PT Goals (current goals can now be found in the care plan section) Acute Rehab PT Goals PT Goal Formulation: Patient unable to participate in goal setting Time For Goal Achievement: 08/28/22 Potential to Achieve Goals: Fair Progress towards PT goals: Progressing toward goals    Frequency    Min 3X/week      PT Plan Discharge plan needs to be updated;Frequency needs to be updated    Co-evaluation              AM-PAC PT "6 Clicks" Mobility   Outcome Measure  Help needed turning from your back to your side while in a flat bed without using bedrails?: A Little Help needed moving from lying on your back to sitting on the side of a flat bed without using bedrails?: A Little Help needed moving to and from a bed to a chair (including a wheelchair)?: A Little Help needed standing up from a chair using your arms (e.g., wheelchair or bedside chair)?: A Little Help needed to walk in hospital room?: A Little Help needed climbing 3-5 steps with a railing? : A Lot 6 Click Score: 17    End of Session Equipment Utilized During Treatment: Gait belt Activity Tolerance: Patient tolerated treatment well Patient left: with call bell/phone within reach;in chair;with chair alarm set Nurse Communication: Mobility status PT Visit Diagnosis: Other abnormalities of gait and mobility (R26.89);Difficulty in walking, not elsewhere classified (R26.2);Muscle weakness (generalized) (M62.81)     Time: 1610-9604 PT Time Calculation (min) (ACUTE ONLY): 25  min  Charges:  $Gait Training: 8-22 mins $Therapeutic Activity: 8-22 mins                     James Ibarra, PT DPT Acute Rehabilitation Services Office [phone]    James Ibarra 08/24/2022, 1:57 PM

## 2022-08-25 DIAGNOSIS — G9341 Metabolic encephalopathy: Secondary | ICD-10-CM | POA: Diagnosis not present

## 2022-08-25 DIAGNOSIS — R1310 Dysphagia, unspecified: Secondary | ICD-10-CM | POA: Diagnosis not present

## 2022-08-25 DIAGNOSIS — A419 Sepsis, unspecified organism: Secondary | ICD-10-CM | POA: Diagnosis not present

## 2022-08-25 DIAGNOSIS — E785 Hyperlipidemia, unspecified: Secondary | ICD-10-CM | POA: Diagnosis not present

## 2022-08-25 DIAGNOSIS — I341 Nonrheumatic mitral (valve) prolapse: Secondary | ICD-10-CM | POA: Diagnosis not present

## 2022-08-25 DIAGNOSIS — R6520 Severe sepsis without septic shock: Secondary | ICD-10-CM | POA: Diagnosis not present

## 2022-08-25 DIAGNOSIS — M6281 Muscle weakness (generalized): Secondary | ICD-10-CM | POA: Diagnosis not present

## 2022-08-25 DIAGNOSIS — F10939 Alcohol use, unspecified with withdrawal, unspecified: Secondary | ICD-10-CM | POA: Diagnosis not present

## 2022-08-25 DIAGNOSIS — J9601 Acute respiratory failure with hypoxia: Secondary | ICD-10-CM | POA: Diagnosis not present

## 2022-08-25 DIAGNOSIS — R6521 Severe sepsis with septic shock: Secondary | ICD-10-CM | POA: Diagnosis not present

## 2022-08-25 DIAGNOSIS — E43 Unspecified severe protein-calorie malnutrition: Secondary | ICD-10-CM | POA: Diagnosis not present

## 2022-08-25 DIAGNOSIS — R531 Weakness: Secondary | ICD-10-CM | POA: Diagnosis not present

## 2022-08-25 DIAGNOSIS — Z743 Need for continuous supervision: Secondary | ICD-10-CM | POA: Diagnosis not present

## 2022-08-25 DIAGNOSIS — D529 Folate deficiency anemia, unspecified: Secondary | ICD-10-CM | POA: Diagnosis not present

## 2022-08-25 DIAGNOSIS — I10 Essential (primary) hypertension: Secondary | ICD-10-CM | POA: Diagnosis not present

## 2022-08-25 DIAGNOSIS — I4891 Unspecified atrial fibrillation: Secondary | ICD-10-CM | POA: Diagnosis not present

## 2022-08-25 DIAGNOSIS — E519 Thiamine deficiency, unspecified: Secondary | ICD-10-CM | POA: Diagnosis not present

## 2022-08-25 DIAGNOSIS — I6339 Cerebral infarction due to thrombosis of other cerebral artery: Secondary | ICD-10-CM | POA: Diagnosis not present

## 2022-08-25 DIAGNOSIS — R2689 Other abnormalities of gait and mobility: Secondary | ICD-10-CM | POA: Diagnosis not present

## 2022-08-25 DIAGNOSIS — G500 Trigeminal neuralgia: Secondary | ICD-10-CM | POA: Diagnosis not present

## 2022-08-25 DIAGNOSIS — M792 Neuralgia and neuritis, unspecified: Secondary | ICD-10-CM | POA: Diagnosis not present

## 2022-08-25 DIAGNOSIS — Z7401 Bed confinement status: Secondary | ICD-10-CM | POA: Diagnosis not present

## 2022-08-25 DIAGNOSIS — G4700 Insomnia, unspecified: Secondary | ICD-10-CM | POA: Diagnosis not present

## 2022-08-25 DIAGNOSIS — R1312 Dysphagia, oropharyngeal phase: Secondary | ICD-10-CM | POA: Diagnosis not present

## 2022-08-25 MED ORDER — APIXABAN 5 MG PO TABS: 5.0000 mg | ORAL_TABLET | Freq: Two times a day (BID) | ORAL | Status: DC

## 2022-08-25 MED ORDER — VITAMIN B-1 100 MG PO TABS: 100.0000 mg | ORAL_TABLET | Freq: Every day | ORAL | Status: DC

## 2022-08-25 MED ORDER — METOPROLOL TARTRATE 25 MG PO TABS: 25.0000 mg | ORAL_TABLET | Freq: Two times a day (BID) | ORAL | Status: DC

## 2022-08-25 MED ORDER — FOLIC ACID 1 MG PO TABS: 1.0000 mg | ORAL_TABLET | Freq: Every day | ORAL | Status: DC

## 2022-08-25 MED ORDER — GABAPENTIN 100 MG PO CAPS: 200.0000 mg | ORAL_CAPSULE | Freq: Two times a day (BID) | ORAL | Status: DC

## 2022-08-25 NOTE — TOC Transition Note (Signed)
Transition of Care [facility]) - CM/SW Discharge Note   Patient Details  Name: James Ibarra MRN: [id number] Date of Birth: 03-12-1950  Transition of Care [facility]) CM/SW Contact:  Mearl Latin, LCSW Phone Number: 08/25/2022, 12:32 PM   Clinical Narrative:    Patient will DC to: [facility] Anticipated DC date: 08/25/22 Family notified: Spouse Transport by: Sharin Mons   Per MD patient ready for DC to [facility]. RN to call report prior to discharge [phone] room 203). RN, patient, patient's family, and facility notified of DC. Discharge Summary and FL2 sent to facility. DC packet on chart. Ambulance transport requested for patient.   CSW will sign off for now as social work intervention is no longer needed. Please consult [place] again if new needs arise.     Final next level of care: Skilled Nursing Facility Barriers to Discharge: Barriers Resolved   Patient Goals and CMS Choice CMS Medicare.gov Compare Post Acute Care list provided to:: Patient Represenative (must comment) Choice offered to / list presented to : Spouse  Discharge Placement     Existing PASRR number confirmed : 08/25/22          Patient chooses bed at: [facility] Patient to be transferred to facility by: [facility] Name of family member notified: Spouse Patient and family notified of of transfer: 08/25/22  Discharge Plan and Services Additional resources added to the After Visit Summary for   In-house Referral: Clinical Social Work   Post Acute Care Choice: Skilled Nursing Facility                               Social Determinants of Health (SDOH) Interventions SDOH Screenings   Food Insecurity: No Food Insecurity (08/12/2022)  Housing: Low Risk  (08/12/2022)  Transportation Needs: No Transportation Needs (08/12/2022)  Utilities: Not At Risk (08/12/2022)  Tobacco Use: Low Risk  (08/12/2022)     Readmission Risk Interventions     No data to display

## 2022-08-25 NOTE — TOC Progression Note (Signed)
Transition of Care [facility]) - Progression Note    Patient Details  Name: James Ibarra MRN: [id number] Date of Birth: November 02, 1950  [facility]) CM/SW Contact  Mearl Latin, LCSW Phone Number: 08/25/2022, 8:58 AM  Clinical Narrative:    Insurance approval received for [facility], Ref# [id number], Auth ID# [id number], effective 08/25/2022-08/29/2022. Spouse completing paperwork at [facility].    Expected Discharge Plan: Skilled Nursing Facility Barriers to Discharge: Barriers Resolved  Expected Discharge Plan and Services In-house Referral: Clinical Social Work   Post Acute Care Choice: Skilled Nursing Facility Living arrangements for the past 2 months: Single Family Home                                       Social Determinants of Health (SDOH) Interventions SDOH Screenings   Food Insecurity: No Food Insecurity (08/12/2022)  Housing: Low Risk  (08/12/2022)  Transportation Needs: No Transportation Needs (08/12/2022)  Utilities: Not At Risk (08/12/2022)  Tobacco Use: Low Risk  (08/12/2022)    Readmission Risk Interventions     No data to display

## 2022-08-25 NOTE — Progress Notes (Signed)
Occupational Therapy Treatment Patient Details Name: James Ibarra MRN: [id number] DOB: [birth year] Today's Date: 08/25/2022   History of present illness 72 y/o M adm 6/8 from home with weakness, severe sepsis 2/2 multifocal PNA. 6/11 transfer to ICU with hypoxemia and agitation. PMH includes trigeminal neuralgia, facial spasm, HTN and HLD   OT comments  Pt supine in bed with HOB elevated upon OT arrival. Pt agreeable to participation in skilled OT session. OT educated pt in techniques for increased safety and independence with ADLs, bed mobility, functional transfers, and functional mobility with a RW. Pt currently demonstrates ability to complete UB ADLs Mod I to Min assist, LB ADLs with Min guard to Min assist, bed mobility with Supervision, and functional transfers/mobility with a RW with Min guard assist. Pt currently requires cues during functional tasks for safety, sequencing, and use of compensatory strategies. Pt continues to present with generalized B UE weakness, decreased activity tolerance, and decreased cognition. Pt participated well in session and is making good progress toward OT goals. Pt will benefit from continued acute skilled OT services to address deficits and increased safety and independence with ADLs, bed mobility, and functional transfers/mobility. Discharge plan remains appropriate.    Recommendations for follow up therapy are one component of a multi-disciplinary discharge planning process, led by the attending physician.  Recommendations may be updated based on patient status, additional functional criteria and insurance authorization.    Assistance Recommended at Discharge Frequent or constant Supervision/Assistance  Patient can return home with the following  A little help with walking and/or transfers;A little help with bathing/dressing/bathroom;Assistance with cooking/housework;Direct supervision/assist for medications management;Direct supervision/assist for  financial management;Assist for transportation;Help with stairs or ramp for entrance   Equipment Recommendations  Other (comment) (Defer to next level of care)    Recommendations for Other Services      Precautions / Restrictions Precautions Precautions: Fall Restrictions Weight Bearing Restrictions: No       Mobility Bed Mobility Overal bed mobility: Needs Assistance Bed Mobility: Supine to Sit, Sit to Supine, Rolling Rolling: Supervision   Supine to sit: Supervision, HOB elevated Sit to supine: Supervision, HOB elevated (cues for technique and safety)        Transfers Overall transfer level: Needs assistance Equipment used: Rolling walker (2 wheels) Transfers: Sit to/from Stand, Bed to chair/wheelchair/BSC Sit to Stand: Min guard     Step pivot transfers: Min guard (with use of grab bar)     General transfer comment: Cues for hand placement and technique     Balance Overall balance assessment: Needs assistance Sitting-balance support: No upper extremity supported, Feet supported Sitting balance-Leahy Scale: Fair     Standing balance support: Single extremity supported, Bilateral upper extremity supported, During functional activity, Reliant on assistive device for balance Standing balance-Leahy Scale: Poor                             ADL either performed or assessed with clinical judgement   ADL Overall ADL's : Needs assistance/impaired Eating/Feeding: Modified independent;Sitting   Grooming: Min guard;Cueing for sequencing;Standing   Upper Body Bathing: Minimal assistance;Cueing for sequencing;Cueing for compensatory techniques;Sitting   Lower Body Bathing: Minimal assistance;Cueing for safety;Cueing for sequencing;Cueing for compensatory techniques;Sitting/lateral leans   Upper Body Dressing : Min guard (Min guard for line management)   Lower Body Dressing: Minimal assistance;Cueing for safety;Cueing for sequencing;Cueing for compensatory  techniques;Sit to/from stand   Toilet Transfer: Min guard;Cueing for safety;Cueing for sequencing;Ambulation;Regular Toilet;Rolling  walker (2 wheels);Grab bars   Toileting- Clothing Manipulation and Hygiene: Min guard;Cueing for safety;Cueing for sequencing;Cueing for compensatory techniques;Sit to/from stand       Functional mobility during ADLs: Min guard;Cueing for safety;Cueing for sequencing;Rolling walker (2 wheels) General ADL Comments: Pt continues to present with decreased activity tolerance. Pt contiunes to require cues for safety, sequancing, and compensatory techniques.    Extremity/Trunk Assessment Upper Extremity Assessment Upper Extremity Assessment: Generalized weakness   Lower Extremity Assessment Lower Extremity Assessment: Defer to PT evaluation        Vision       Perception     Praxis Praxis Praxis: Impaired Praxis Impairment Details:  (Organization) Praxis-Other Comments: Pt requires occasional cues for sequencing.    Cognition Arousal/Alertness: Awake/alert Behavior During Therapy: Flat affect Overall Cognitive Status: Impaired/Different from baseline Area of Impairment: Attention, Following commands, Safety/judgement, Problem solving                   Current Attention Level: Alternating Memory: Decreased short-term memory Following Commands: Follows one step commands consistently, Follows multi-step commands with increased time, Follows multi-step commands inconsistently Safety/Judgement: Decreased awareness of safety, Decreased awareness of deficits Awareness: Emergent Problem Solving: Slow processing, Requires verbal cues General Comments: Pt requires min cues for safety during ADLs and functional transfers/mobility.        Exercises      Shoulder Instructions       General Comments VSS throughout session on RA    Pertinent Vitals/ Pain       Pain Assessment Pain Assessment: No/denies pain  Home Living                                           Prior Functioning/Environment              Frequency  Min 2X/week        Progress Toward Goals  OT Goals(current goals can now be found in the care plan section)  Progress towards OT goals: Progressing toward goals  Acute Rehab OT Goals Patient Stated Goal: To get stronger and not be aburden on his wife  Plan Discharge plan remains appropriate    Co-evaluation                 AM-PAC OT "6 Clicks" Daily Activity     Outcome Measure   Help from another person eating meals?: None Help from another person taking care of personal grooming?: A Little Help from another person toileting, which includes using toliet, bedpan, or urinal?: A Little Help from another person bathing (including washing, rinsing, drying)?: A Little Help from another person to put on and taking off regular upper body clothing?: A Little Help from another person to put on and taking off regular lower body clothing?: A Little 6 Click Score: 19    End of Session Equipment Utilized During Treatment: Gait belt;Rolling walker (2 wheels)  OT Visit Diagnosis: Unsteadiness on feet (R26.81);Other abnormalities of gait and mobility (R26.89);Muscle weakness (generalized) (M62.81)   Activity Tolerance Patient tolerated treatment well   Patient Left in bed;with call bell/phone within reach;with bed alarm set   Nurse Communication Mobility status        Time: 1030-1054 OT Time Calculation (min): 24 min  Charges: OT General Charges $OT Visit: 1 Visit OT Treatments $Self Care/Home Management : 23-37 mins  Curtistine Pettitt "Kyle" M., OTR/L, MA Acute  Rehab [phone]   Lendon Colonel 08/25/2022, 1:22 PM

## 2022-08-25 NOTE — Discharge Summary (Signed)
PATIENT DETAILS Name: James Ibarra Age: 72 y.o. Sex: male Date of Birth: 1950/11/19 MRN: [id number]. Admitting Physician: Almon Hercules, MD WUJ:[id number], [name], MD  Admit Date: 08/12/2022 Discharge date: 08/25/2022  Recommendations for Outpatient Follow-up:  Follow up with PCP in 1-2 weeks Please obtain CMP/CBC in one week  Admitted From:  Home  Disposition: Skilled nursing facility   Discharge Condition: good  CODE STATUS:   Code Status: Full Code   Diet recommendation:  Diet Order             Diet - low sodium heart healthy           DIET DYS 3 Room service appropriate? Yes with Assist; Fluid consistency: Thin  Diet effective now                    Brief Summary: Patient is a 72 y.o.  male with history of EtOH use, HTN, HLD-presented with fever/tachycardia-found to have PNA-Hospital course complicated by worsening hypoxemia requiring HFNC, A-fib RVR, and alcohol withdrawal/DTs.   Significant events: 6/8>> admit to [facility] with PNA 6/11>> worsening R-sided infiltrate, hypoxia and mental status, transfer to ICU on HFNC 6/12>> phenobarb taper added, confused, IV cardizem  6/14>> up walking in hallway, transferred to Progressive care.   Significant studies: 4/26>> echo: EF 60-65%, severe prolapse of posterior MV leaflet with eccentric MR. 6/8>> CXR: Multifocal PNA 6/11>> CXR: Worsening of interstitial/patchy airspace opacities. 6/11>> CXR: Improved aeration of right lung base.   Significant microbiology data: 6/8>> COVID/influenza/RSV PCR: Negative 6/8>> blood culture: No growth 6/11>> respiratory virus panel: Rhinovirus   Procedures: None   Consults: PCCM  Brief Hospital Course: Acute hypoxic respiratory failure secondary to secondary to rhinovirus pneumonia and superimposed bacterial infection Clinically improved-on room air Has finished a course of antibiotics   Acute metabolic encephalopathy in the setting of hypoxia/PNA Alcohol withdrawal with  severe DTs Resolved after treatment of underlying pneumonia and DTs Completely awake and alert Do not think patient requires Seroquel on discharge.    PAF Maintaining sinus rhythm Continue metoprolol/Eliquis   Trigeminal neuralgia Stable Neurontin initially held due to encephalopathy-subsequently has been resumed-per patient he only takes it twice a day.    Oropharyngeal dysphagia Due to severe encephalopathy in the setting of critical illness SLP followed closely-gradually increase to dysphagia 3 diet by the day of discharge.  NG tube was discontinued several days ago   Mitral valve prolapse with severe MR Stable for outpatient follow-up with cardiology. Unfortunately he missed his appointment because he was hospitalized-spoke with cardiology team-they will get him set up in the next several weeks as he recovers from this acute illness.  HTN BP stable on just metoprolol All other antihypertensives on hold-resume when able.   Debility/deconditioning Due to acute illness-thankfully rapidly improving. SNF recommended   Nutrition Status: Nutrition Problem: Severe Malnutrition Etiology: chronic illness Signs/Symptoms: severe muscle depletion, severe fat depletion Interventions: Hormel Shake, MVI   BMI: Estimated body mass index is 18.51 kg/m as calculated from the following:   Height as of this encounter: 6\' 2"  (1.88 m).   Weight as of this encounter: 65.4 kg.   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   Hypertension   PNA (pneumonia)   HLD (hyperlipidemia)   Alcohol withdrawal (HCC)   Acute metabolic encephalopathy   Paroxysmal atrial fibrillation  with RVR (HCC)   Severe mitral valve regurgitation   Mitral valve prolapse   Protein-calorie malnutrition, severe   Discharge Instructions:  Activity:  As tolerated with Full  fall precautions use walker/cane & assistance as needed  Discharge Instructions     Call MD for:  persistant dizziness or light-headedness   Complete by: As directed    Call MD for:  severe uncontrolled pain   Complete by: As directed    Diet - low sodium heart healthy   Complete by: As directed    Discharge instructions   Complete by: As directed    Follow with Primary MD  Farris Has, MD in 1-2 weeks  Please get a complete blood count and chemistry panel checked by your Primary MD at your next visit, and again as instructed by your Primary MD.  Get Medicines reviewed and adjusted: Please take all your medications with you for your next visit with your Primary MD  Laboratory/radiological data: Please request your Primary MD to go over all hospital tests and procedure/radiological results at the follow up, please ask your Primary MD to get all Hospital records sent to his/her office.  In some cases, they will be blood work, cultures and biopsy results pending at the time of your discharge. Please request that your primary care M.D. follows up on these results.  Also Note the following: If you experience worsening of your admission symptoms, develop shortness of breath, life threatening emergency, suicidal or homicidal thoughts you must seek medical attention immediately by calling 911 or calling your MD immediately  if symptoms less severe.  You must read complete instructions/literature along with all the possible adverse reactions/side effects for all the Medicines you take and that have been prescribed to you. Take any new Medicines after you have completely understood and accpet all the possible adverse reactions/side effects.   Do not drive when taking Pain medications or sleeping medications (Benzodaizepines)  Do not take more than prescribed Pain, Sleep and Anxiety Medications. It is not advisable to combine anxiety,sleep and pain medications without talking with your primary  care practitioner  Special Instructions: If you have smoked or chewed Tobacco  in the last 2 yrs please stop smoking, stop any regular Alcohol  and or any Recreational drug use.  Wear Seat belts while driving.  Please note: You were cared for by a hospitalist during your hospital stay. Once you are discharged, your primary care physician will handle any further medical issues. Please note that NO REFILLS for any discharge medications will be authorized once you are discharged, as it is imperative that you return to your primary care physician (or establish a relationship with a primary care physician if you do not have one) for your post hospital discharge needs so that they can reassess your need for medications and monitor your lab values.   Increase activity slowly   Complete by: As directed    No dressing needed   Complete by: As directed       Allergies as of 08/25/2022   No Known Allergies      Medication List     STOP taking these medications    amLODipine 5 MG tablet Commonly known as: NORVASC   azithromycin 250 MG tablet Commonly known as: ZITHROMAX   HYDROcodone bit-homatropine 5-1.5 MG/5ML syrup Commonly known as: HYCODAN   methylPREDNISolone 4 MG Tbpk tablet Commonly known as: MEDROL DOSEPAK   olmesartan-hydrochlorothiazide 40-25 MG tablet Commonly known as: BENICAR HCT   potassium chloride  SA 20 MEQ tablet Commonly known as: KLOR-CON M       TAKE these medications    albuterol 108 (90 Base) MCG/ACT inhaler Commonly known as: VENTOLIN HFA Inhale 1 puff into the lungs every 6 (six) hours as needed for wheezing or shortness of breath.   apixaban 5 MG Tabs tablet Commonly known as: ELIQUIS Take 1 tablet (5 mg total) by mouth 2 (two) times daily.   folic acid 1 MG tablet Commonly known as: FOLVITE Take 1 tablet (1 mg total) by mouth daily.   gabapentin 100 MG capsule Commonly known as: NEURONTIN Take 2 capsules (200 mg total) by mouth 2 (two) times  daily. What changed:  medication strength how much to take how to take this when to take this additional instructions   ICAPS AREDS 2 PO Take 1 capsule by mouth in the morning and at bedtime.   ketoconazole 2 % cream Commonly known as: NIZORAL Apply 1 Application topically as needed for irritation.   melatonin 3 MG Tabs tablet Take 3 mg by mouth at bedtime as needed.   metoprolol tartrate 25 MG tablet Commonly known as: LOPRESSOR Take 1 tablet (25 mg total) by mouth 2 (two) times daily.   pravastatin 10 MG tablet Commonly known as: PRAVACHOL Take 10 mg by mouth at bedtime.   sildenafil 20 MG tablet Commonly known as: REVATIO Take 2-5 tablets for ED orally once a day if needed   thiamine 100 MG tablet Commonly known as: Vitamin B-1 Take 1 tablet (100 mg total) by mouth daily.   traZODone 100 MG tablet Commonly known as: DESYREL Take 50-100 mg by mouth at bedtime as needed.               Discharge Care Instructions  (From admission, onward)           Start     Ordered   08/25/22 0000  No dressing needed        08/25/22 4540            Contact information for follow-up providers     Farris Has, MD. Schedule an appointment as soon as possible for a visit in 1 week(s).   Specialty: Family Medicine Contact information: [address] [place] [address] [phone]              Contact information for after-discharge care     Destination     [facility] Preferred SNF .   Service: Skilled Nursing Contact information: [address] [place] [address] [phone]                    No Known Allergies   Other Procedures/Studies: DG Swallowing Func-Speech Pathology  Result Date: 08/22/2022 Table formatting from the original result was not included. Modified Barium Swallow Study Patient Details Name: James Ibarra MRN: [id number] Date of Birth:  01/10/1951 Today's Date: 08/22/2022 HPI/PMH: HPI: 72 y/o M adm 6/8 from home with weakness, severe sepsis 2/2 multifocal PNA, acute metabolic encephalopathy with alcohol withdrawal symptoms. 6/11 transfer to ICU with hypoxemia and agitation. PMH includes trigeminal neuralgia, facial spasm, HTN and HLD Clinical Impression: Clinical Impression: Mr. Gallentine presents with a primary deficit in timing of the swallow response related to his mentation. Mechanics of the swallow appeared to be [facility] - mastication, pharyngeal stripping, UES patency were [facility]. He demonstrated moderate aspiration of thin liquids before the onset of the pharyngeal swallow - eliciting explosive coughing. There  was adequate laryngeal vestibule closure, but as stated, timing of closure was dysynchronous with large sips of thin liquid.  Recommend starting dysphagia 2/nectar thick liquids for now.  SLP will follow and trial single/controlled sips of thin liquid at the bedside. Factors that may increase risk of adverse event in presence of aspiration Rubye Oaks & Clearance Coots 2021): Factors that may increase risk of adverse event in presence of aspiration Rubye Oaks & Clearance Coots 2021): Reduced cognitive function Recommendations/Plan: Swallowing Evaluation Recommendations Swallowing Evaluation Recommendations Recommendations: PO diet PO Diet Recommendation: Dysphagia 2 (Finely chopped); Mildly thick liquids (Level 2, nectar thick) Liquid Administration via: Cup; Straw Medication Administration: Whole meds with puree Supervision: Full assist for feeding Postural changes: Position pt fully upright for meals Oral care recommendations: Oral care BID (2x/day) Treatment Plan Treatment Plan Treatment recommendations: Therapy as outlined in treatment plan below Follow-up recommendations: Skilled nursing-short term rehab (<3 hours/day) Functional status assessment: Patient has had a recent decline in their functional status and demonstrates the ability to make significant  improvements in function in a reasonable and predictable amount of time. Treatment frequency: Min 2x/week Treatment duration: 1 week Interventions: Trials of upgraded texture/liquids; Patient/family education; Diet toleration management by SLP Recommendations Recommendations for follow up therapy are one component of a multi-disciplinary discharge planning process, led by the attending physician.  Recommendations may be updated based on patient status, additional functional criteria and insurance authorization. Assessment: Orofacial Exam: Orofacial Exam Oral Cavity - Dentition: Adequate natural dentition Orofacial Anatomy: WFL Oral Motor/Sensory Function: WFL Anatomy: Anatomy: WFL Boluses Administered: Boluses Administered Boluses Administered: Thin liquids (Level 0); Mildly thick liquids (Level 2, nectar thick); Puree; Solid  Oral Impairment Domain: Oral Impairment Domain Lip Closure: No labial escape Tongue control during bolus hold: Cohesive bolus between tongue to palatal seal Bolus preparation/mastication: Slow prolonged chewing/mashing with complete recollection Bolus transport/lingual motion: Brisk tongue motion Oral residue: Complete oral clearance Location of oral residue : N/A Initiation of pharyngeal swallow : Pyriform sinuses  Pharyngeal Impairment Domain: Pharyngeal Impairment Domain Soft palate elevation: No bolus between soft palate (SP)/pharyngeal wall (PW) Laryngeal elevation: Complete superior movement of thyroid cartilage with complete approximation of arytenoids to epiglottic petiole Anterior hyoid excursion: Complete anterior movement Epiglottic movement: Complete inversion Laryngeal vestibule closure: Incomplete, narrow column air/contrast in laryngeal vestibule Pharyngeal stripping wave : Present - complete Pharyngeal contraction (A/P view only): N/A Pharyngoesophageal segment opening: Complete distension and complete duration, no obstruction of flow Tongue base retraction: No contrast between  tongue base and posterior pharyngeal wall (PPW) Pharyngeal residue: Complete pharyngeal clearance Location of pharyngeal residue: N/A  Esophageal Impairment Domain: Esophageal Impairment Domain Esophageal clearance upright position: -- (n/a) Pill: Pill Consistency administered: -- (NT) Penetration/Aspiration Scale Score: No data recorded Compensatory Strategies: Compensatory Strategies Compensatory strategies: Yes Straw: -- (no change with or without straw)   General Information: Caregiver present: No  Diet Prior to this Study: Dysphagia 1 (pureed); Moderately thick liquids (Level 3, honey thick)   Temperature : Normal   Respiratory Status: WFL   Supplemental O2: None (Room air)   History of Recent Intubation: No  Behavior/Cognition: Lethargic/Drowsy Self-Feeding Abilities: Needs assist with self-feeding Baseline vocal quality/speech: Normal Volitional Cough: Able to elicit Volitional Swallow: Unable to elicit No data recorded Goal Planning: Prognosis for improved oropharyngeal function: Good Barriers to Reach Goals: Cognitive deficits No data recorded No data recorded Consulted and agree with results and recommendations: Pt unable/family or caregiver not available Pain: Pain Assessment Pain Assessment: No/denies pain Faces Pain Scale: 0 Facial Expression: 1  Body Movements: 1 Muscle Tension: 1 Compliance with ventilator (intubated pts.): N/A Vocalization (extubated pts.): N/A CPOT Total: 3 End of Session: Start Time:SLP Start Time (ACUTE ONLY): 1035 Stop Time: SLP Stop Time (ACUTE ONLY): 1100 Time Calculation:SLP Time Calculation (min) (ACUTE ONLY): 25 min Charges: SLP Evaluations $ SLP Speech Visit: 1 Visit SLP Evaluations $MBS Swallow: 1 Procedure $Swallowing Treatment: 1 Procedure SLP visit diagnosis: SLP Visit Diagnosis: Dysphagia, oropharyngeal phase (R13.12) Past Medical History: Past Medical History: Diagnosis Date  History of nonmelanoma skin cancer   Hypercholesteremia   Hypertension   Trigeminal neuralgia   Past Surgical History: Past Surgical History: Procedure Laterality Date  HERNIA REPAIR  11/07/11  [facility]  INGUINAL HERNIA REPAIR  11/07/2011  Procedure: LAPAROSCOPIC INGUINAL HERNIA;  Surgeon: Atilano Ina, MD,FACS;  Location: [facility];  Service: General;  Laterality: Left;  RETINAL DETACHMENT SURGERY  1997  SKIN CANCER EXCISION  2012, 1999  basal cell - neck and hand [name] [name] 08/22/2022, 1:44 PM  DG Abd Portable 1V  Result Date: 08/18/2022 CLINICAL DATA:  Evaluate feeding tube placement EXAM: PORTABLE ABDOMEN - 1 VIEW COMPARISON:  None Available. FINDINGS: The feeding tube terminates in the lower abdomen to the left of midline, likely within the distal aspect of a J-shaped or distended stomach. IMPRESSION: The feeding tube terminates in the lower abdomen to the left of midline, likely within the distal aspect of a J-shaped or distended stomach. Electronically Signed   By: Gerome Sam III M.D.   On: 08/18/2022 18:20   DG CHEST PORT 1 VIEW  Result Date: 08/18/2022 CLINICAL DATA:  Viral pneumonia EXAM: PORTABLE CHEST 1 VIEW COMPARISON:  Chest radiograph 08/18/2022 FINDINGS: The cardiomediastinal silhouette is stable. Aeration of the right lung has improved but with residual confluent opacities in the right upper and lower lobes. The left lung remains clear. There is no new or worsening focal airspace disease. There is no significant pleural effusion. There is no pneumothorax There is no acute osseous abnormality. IMPRESSION: Improved aeration of the right lung but with persistent confluent opacities in the right upper and lower lobes. Electronically Signed   By: Lesia Hausen M.D.   On: 08/18/2022 08:22   DG CHEST PORT 1 VIEW  Result Date: 08/15/2022 CLINICAL DATA:  200808 with hypoxia and fever. EXAM: PORTABLE CHEST 1 VIEW COMPARISON:  Chest AP Lat 08/12/2022 FINDINGS: 12:57 a.m. The heart is enlarged. Central vessels are normal caliber. There is interval worsening of interstitial and patchy  airspace consolidative opacities in the right lung fields, increased small underlying right pleural effusion. Findings most likely indicate multilobar pneumonia, less likely asymmetric edema. There is aortic tortuosity with stable mediastinal configuration. Mild aortic atherosclerosis. The left sulci are sharp.  Osteopenia. IMPRESSION: 1. Interval worsening of interstitial and patchy airspace consolidative opacities in the right lung fields, most likely multilobar pneumonia, less likely asymmetric edema. 2. Increased small right pleural effusion. 3. Stable cardiomegaly. 4. Aortic atherosclerosis. Electronically Signed   By: Almira Bar M.D.   On: 08/15/2022 01:30   DG Chest 2 View  Result Date: 08/12/2022 CLINICAL DATA:  Cough, concern for pneumonia. EXAM: CHEST - 2 VIEW COMPARISON:  Chest radiograph 05/23/2018 FINDINGS: The heart is at the upper limits of normal for size, likely exaggerated by AP technique. The upper mediastinal contours are normal allowing for rightward patient rotation. There is confluent opacity in the right upper lobe along the fissure. As well as patchy opacities in the right lower lobe. The left lung is clear.  There is no significant pleural effusion. There is no pneumothorax. There is no acute osseous abnormality. IMPRESSION: Opacities in the right upper and lower lobes consistent with multifocal pneumonia. Recommend follow-up radiographs in 6-8 weeks to ensure resolution. Electronically Signed   By: Lesia Hausen M.D.   On: 08/12/2022 15:22     TODAY-DAY OF DISCHARGE:  Subjective:   Kirke Shaggy today has no headache,no chest abdominal pain,no new weakness tingling or numbness, feels much better wants to go home today.   Objective:   Blood pressure 113/68, pulse 74, temperature 98 F (36.7 C), temperature source Oral, resp. rate 19, height 6\' 2"  (1.88 m), weight 65.4 kg, SpO2 94 %.  Intake/Output Summary (Last 24 hours) at 08/25/2022 0917 Last data filed at 08/24/2022  2000 Gross per 24 hour  Intake 120 ml  Output 350 ml  Net -230 ml   Filed Weights   08/16/22 0500 08/21/22 0500 08/23/22 0707  Weight: 66.5 kg 67 kg 65.4 kg    Exam: Awake Alert, Oriented *3, No new F.N deficits, Normal affect [place].AT,PERRAL Supple Neck,No JVD, No cervical lymphadenopathy appriciated.  Symmetrical Chest wall movement, Good air movement bilaterally, CTAB RRR,No Gallops,Rubs or new Murmurs, No Parasternal Heave +ve B.Sounds, Abd Soft, Non tender, No organomegaly appriciated, No rebound -guarding or rigidity. No Cyanosis, Clubbing or edema, No new Rash or bruise   PERTINENT RADIOLOGIC STUDIES: No results found.   PERTINENT LAB RESULTS: CBC: No results for input(s): "WBC", "HGB", "HCT", "PLT" in the last 72 hours. CMET CMP     Component Value Date/Time   NA 137 08/23/2022 0215   K 3.8 08/23/2022 0215   CL 103 08/23/2022 0215   CO2 23 08/23/2022 0215   GLUCOSE 100 (H) 08/23/2022 0215   BUN 12 08/23/2022 0215   CREATININE 0.54 (L) 08/23/2022 0215   CALCIUM 8.8 (L) 08/23/2022 0215   PROT 6.1 (L) 08/20/2022 0319   ALBUMIN 2.4 (L) 08/20/2022 0319   AST 75 (H) 08/20/2022 0319   ALT 105 (H) 08/20/2022 0319   ALKPHOS 55 08/20/2022 0319   BILITOT 0.6 08/20/2022 0319   GFRNONAA >60 08/23/2022 0215    GFR Estimated Creatinine Clearance: 78.3 mL/min (A) (by C-G formula based on SCr of 0.54 mg/dL (L)). No results for input(s): "LIPASE", "AMYLASE" in the last 72 hours. No results for input(s): "CKTOTAL", "CKMB", "CKMBINDEX", "TROPONINI" in the last 72 hours. Invalid input(s): "POCBNP" No results for input(s): "DDIMER" in the last 72 hours. No results for input(s): "HGBA1C" in the last 72 hours. No results for input(s): "CHOL", "HDL", "LDLCALC", "TRIG", "CHOLHDL", "LDLDIRECT" in the last 72 hours. No results for input(s): "TSH", "T4TOTAL", "T3FREE", "THYROIDAB" in the last 72 hours.  Invalid input(s): "FREET3" No results for input(s): "VITAMINB12", "FOLATE",  "FERRITIN", "TIBC", "IRON", "RETICCTPCT" in the last 72 hours. Coags: No results for input(s): "INR" in the last 72 hours.  Invalid input(s): "PT" Microbiology: No results found for this or any previous visit (from the past 240 hour(s)).  FURTHER DISCHARGE INSTRUCTIONS:  Get Medicines reviewed and adjusted: Please take all your medications with you for your next visit with your Primary MD  Laboratory/radiological data: Please request your Primary MD to go over all hospital tests and procedure/radiological results at the follow up, please ask your Primary MD to get all Hospital records sent to his/her office.  In some cases, they will be blood work, cultures and biopsy results pending at the time of your discharge. Please request that your primary care M.D. goes through  all the records of your hospital data and follows up on these results.  Also Note the following: If you experience worsening of your admission symptoms, develop shortness of breath, life threatening emergency, suicidal or homicidal thoughts you must seek medical attention immediately by calling 911 or calling your MD immediately  if symptoms less severe.  You must read complete instructions/literature along with all the possible adverse reactions/side effects for all the Medicines you take and that have been prescribed to you. Take any new Medicines after you have completely understood and accpet all the possible adverse reactions/side effects.   Do not drive when taking Pain medications or sleeping medications (Benzodaizepines)  Do not take more than prescribed Pain, Sleep and Anxiety Medications. It is not advisable to combine anxiety,sleep and pain medications without talking with your primary care practitioner  Special Instructions: If you have smoked or chewed Tobacco  in the last 2 yrs please stop smoking, stop any regular Alcohol  and or any Recreational drug use.  Wear Seat belts while driving.  Please note: You  were cared for by a hospitalist during your hospital stay. Once you are discharged, your primary care physician will handle any further medical issues. Please note that NO REFILLS for any discharge medications will be authorized once you are discharged, as it is imperative that you return to your primary care physician (or establish a relationship with a primary care physician if you do not have one) for your post hospital discharge needs so that they can reassess your need for medications and monitor your lab values.  Total Time spent coordinating discharge including counseling, education and face to face time equals greater than 30 minutes.  SignedJeoffrey Massed 08/25/2022 9:17 AM

## 2022-08-26 ENCOUNTER — Other Ambulatory Visit: Payer: Self-pay | Admitting: Neurology

## 2022-08-28 DIAGNOSIS — G4700 Insomnia, unspecified: Secondary | ICD-10-CM | POA: Diagnosis not present

## 2022-08-28 DIAGNOSIS — R1310 Dysphagia, unspecified: Secondary | ICD-10-CM | POA: Diagnosis not present

## 2022-08-28 DIAGNOSIS — I10 Essential (primary) hypertension: Secondary | ICD-10-CM | POA: Diagnosis not present

## 2022-08-28 DIAGNOSIS — G500 Trigeminal neuralgia: Secondary | ICD-10-CM | POA: Diagnosis not present

## 2022-08-28 DIAGNOSIS — E43 Unspecified severe protein-calorie malnutrition: Secondary | ICD-10-CM | POA: Diagnosis not present

## 2022-08-28 DIAGNOSIS — I341 Nonrheumatic mitral (valve) prolapse: Secondary | ICD-10-CM | POA: Diagnosis not present

## 2022-08-28 DIAGNOSIS — M6281 Muscle weakness (generalized): Secondary | ICD-10-CM | POA: Diagnosis not present

## 2022-08-28 DIAGNOSIS — I4891 Unspecified atrial fibrillation: Secondary | ICD-10-CM | POA: Diagnosis not present

## 2022-08-28 DIAGNOSIS — J9601 Acute respiratory failure with hypoxia: Secondary | ICD-10-CM | POA: Diagnosis not present

## 2022-08-28 DIAGNOSIS — E785 Hyperlipidemia, unspecified: Secondary | ICD-10-CM | POA: Diagnosis not present

## 2022-08-28 NOTE — Telephone Encounter (Signed)
Requested Prescriptions   Pending Prescriptions Disp Refills   gabapentin (NEURONTIN) 300 MG capsule [Pharmacy Med Name: GABAPENTIN CAP 300MG  (NEUR)] 800 capsule 2    Sig: TAKE 2 CAPSULES BY MOUTH 4 TIMES DAILY   Last seen 09/07/21, next appt not scheduDispenses   Dispensed Days Supply Quantity Provider Pharmacy  GABAPENTIN 100 MG CAPSULE 08/25/2022 30 120 each Georgann Housekeeper, MD [facility] ...  GABAPENTIN  300 MG CAPS 06/22/2022 100 800 capsule Levert Feinstein, MD [facility]  GABAPENTIN  300 MG CAPS 03/19/2022 100 800 capsule Levert Feinstein, MD [facility]  gabapentin (NEURONTIN) capsule 12/14/2021 100 800 Levert Feinstein, MD   GABAPENTIN  300 MG CAPS 10/22/2021 100 600 capsule Levert Feinstein, MD [facility]    Requesting refill to soon

## 2022-08-30 DIAGNOSIS — E43 Unspecified severe protein-calorie malnutrition: Secondary | ICD-10-CM | POA: Diagnosis not present

## 2022-08-30 DIAGNOSIS — M6281 Muscle weakness (generalized): Secondary | ICD-10-CM | POA: Diagnosis not present

## 2022-08-30 DIAGNOSIS — E785 Hyperlipidemia, unspecified: Secondary | ICD-10-CM | POA: Diagnosis not present

## 2022-08-30 DIAGNOSIS — I10 Essential (primary) hypertension: Secondary | ICD-10-CM | POA: Diagnosis not present

## 2022-08-30 DIAGNOSIS — I4891 Unspecified atrial fibrillation: Secondary | ICD-10-CM | POA: Diagnosis not present

## 2022-09-11 NOTE — Progress Notes (Unsigned)
Patient ID: James Ibarra MRN: [id number] DOB/AGE: January 29, 1951 72 y.o.  Primary Care Physician:Morrow, [name], MD Primary Cardiologist: Alverda Skeans, MD  PATIENT DID NOT APPEAR FOR APPOINTMENT   FOCUSED CARDIOVASCULAR PROBLEM LIST:   1.  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  Paroxysmal atrial fibrillation on Eliquis 3.  Hyperlipidemia 4.  Hypertension 5.  Alcohol abuse  HISTORY OF PRESENT ILLNESS: The patient is a 72 y.o. male with the indicated medical history here for recommendations regarding his mitral regurgitation.  The patient was admitted in June with a worsening shortness of breath and was found to have developed a right-sided pneumonia.  He was treated with ceftriaxone and azithromycin.  He also developed new onset atrial fibrillation requiring Cardizem drip.  An echocardiogram during this admission demonstrated severe mitral regurgitation thought to be due to posterior prolapse.  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  He was discharged to SNF on Eliquis and metoprolol.  Past Medical History:  Diagnosis Date   History of nonmelanoma skin cancer    Hypercholesteremia    Hypertension    Trigeminal neuralgia     Past Surgical History:  Procedure Laterality Date   HERNIA REPAIR  11/07/11   [facility]   INGUINAL HERNIA REPAIR  11/07/2011   Procedure: LAPAROSCOPIC INGUINAL HERNIA;  Surgeon: Atilano Ina, MD,FACS;  Location: [facility];  Service: General;  Laterality: Left;   RETINAL DETACHMENT SURGERY  1997   SKIN CANCER EXCISION  2012, 1999   basal cell - neck and hand    Family History  Problem Relation Age of Onset   Heart disease Father    Heart disease Mother     Social History   Socioeconomic History   Marital status: Married    Spouse name: Not on file   Number of children: Not on file   Years of education: Not on file   Highest education level: Not on file   Occupational History   Not on file  Tobacco Use   Smoking status: Never   Smokeless tobacco: Never  Substance and Sexual Activity   Alcohol use: Yes    Comment: 3 glasses of wine a day   Drug use: No   Sexual activity: Not on file  Other Topics Concern   Not on file  Social History Narrative   Not on file   Social Determinants of Health   Financial Resource Strain: Not on file  Food Insecurity: No Food Insecurity (08/12/2022)   Hunger Vital Sign    Worried About Running Out of Food in the Last Year: Never true    Ran Out of Food in the Last Year: Never true  Transportation Needs: No Transportation Needs (08/12/2022)   PRAPARE - Administrator, Civil Service (Medical): No    Lack of Transportation (Non-Medical): No  Physical Activity: Not on file  Stress: Not on file  Social Connections: Not on file  Intimate Partner Violence: Not At Risk (08/12/2022)   Humiliation, Afraid, Rape, and Kick questionnaire    Fear of Current or Ex-Partner: No    Emotionally Abused: No    Physically Abused: No    Sexually Abused: No     Prior to Admission medications   Medication Sig Start Date End Date Taking? Authorizing Provider  albuterol (VENTOLIN HFA) 108 (90 Base) MCG/ACT inhaler Inhale 1 puff into the lungs every 6 (six) hours as needed for wheezing or shortness  of breath. 08/11/22   [provider]  apixaban (ELIQUIS) 5 MG TABS tablet Take 1 tablet (5 mg total) by mouth 2 (two) times daily. 08/25/22   Ghimire, Werner Lean, MD  folic acid (FOLVITE) 1 MG tablet Take 1 tablet (1 mg total) by mouth daily. 08/25/22   Ghimire, Werner Lean, MD  gabapentin (NEURONTIN) 100 MG capsule Take 2 capsules (200 mg total) by mouth 2 (two) times daily. 08/25/22   Ghimire, Werner Lean, MD  ketoconazole (NIZORAL) 2 % cream Apply 1 Application topically as needed for irritation. 01/16/13   [provider]  melatonin 3 MG TABS tablet Take 3 mg by mouth at bedtime as needed.    [provider]  metoprolol tartrate (LOPRESSOR) 25 MG tablet Take 1 tablet (25 mg total) by mouth 2 (two) times daily. 08/25/22   Ghimire, Werner Lean, MD  Multiple Vitamins-Minerals (ICAPS AREDS 2 PO) Take 1 capsule by mouth in the morning and at bedtime.    [provider]  pravastatin (PRAVACHOL) 10 MG tablet Take 10 mg by mouth at bedtime.    [provider]  sildenafil (REVATIO) 20 MG tablet Take 2-5 tablets for ED orally once a day if needed    [provider]  thiamine (VITAMIN B-1) 100 MG tablet Take 1 tablet (100 mg total) by mouth daily. 08/25/22   Ghimire, Werner Lean, MD  traZODone (DESYREL) 100 MG tablet Take 50-100 mg by mouth at bedtime as needed.     [provider]    No Known Allergies  REVIEW OF SYSTEMS:  General: no fevers/chills/night sweats Eyes: no blurry vision, diplopia, or amaurosis ENT: no sore throat or hearing loss Resp: no cough, wheezing, or hemoptysis CV: no edema or palpitations GI: no abdominal pain, nausea, vomiting, diarrhea, or constipation GU: no dysuria, frequency, or hematuria Skin: no rash Neuro: no headache, numbness, tingling, or weakness of extremities Musculoskeletal: no joint pain or swelling Heme: no bleeding, DVT, or easy bruising Endo: no polydipsia or polyuria  There were no vitals taken for this visit.  PHYSICAL EXAM:     DATA AND STUDIES:      EKG: Review of several EKGs demonstrate atrial fibrillation with rapid ventricular rate as well as atrial flutter  Cardiac Studies & Procedures       ECHOCARDIOGRAM  ECHOCARDIOGRAM COMPLETE 06/30/2022  Narrative ECHOCARDIOGRAM REPORT    Patient Name:   James Ibarra Date of Exam: 06/30/2022 Medical Rec #:  [id number]       Height:       73.0 in Accession #:    [id number]      Weight:       159.0 lb Date of Birth:  1950/12/27       BSA:          1.952 m Patient Age:    71 years        BP:           141/79 mmHg Patient Gender: M               HR:            64 bpm. Exam Location:  [address]  Procedure: 2D Echo, 3D Echo, Cardiac Doppler, Color Doppler and Strain Analysis  Indications:    R01.1 Murmur  History:        Patient has no prior history of Echocardiogram examinations. Risk Factors:Hypertension and Dyslipidemia.  Sonographer:    Cathie Beams RCS Referring Phys: Farris Has  IMPRESSIONS   1. Severe prolapse of posterior MV leaflet with eccentric MR; likely severe; suggest TEE to further assess. 2. Left ventricular ejection fraction, by estimation, is 60 to 65%. The left ventricle has normal function. The left ventricle has no regional wall motion abnormalities. Left ventricular diastolic function could not be evaluated. The average left ventricular global longitudinal strain is -21.3 %. The global longitudinal strain is normal. 3. Right ventricular systolic function is normal. The right ventricular size is mildly enlarged. 4. Left atrial size was moderately dilated. 5. The mitral valve is normal in structure. Severe mitral valve regurgitation. No evidence of mitral stenosis. There is severe holosystolic prolapse of posterior leaflet of the mitral valve. 6. The aortic valve is tricuspid. Aortic valve regurgitation is not visualized. Aortic valve sclerosis is present, with no evidence of aortic valve stenosis. 7. Aortic dilatation noted. There is borderline dilatation of the aortic root, measuring 39 mm. There is mild dilatation of the ascending aorta, measuring 42 mm. 8. The inferior vena cava is normal in size with greater than 50% respiratory variability, suggesting right atrial pressure of 3 mmHg.  Comparison(s): No prior Echocardiogram.  FINDINGS Left Ventricle: Left ventricular ejection fraction, by estimation, is 60 to 65%. The left ventricle has normal function. The left ventricle has no regional wall motion abnormalities. The average left ventricular global longitudinal strain is -21.3 %. The global  longitudinal strain is normal. The left ventricular internal cavity size was normal in size. There is no left ventricular hypertrophy. Left ventricular diastolic function could not be evaluated.  Right Ventricle: The right ventricular size is mildly enlarged. Right ventricular systolic function is normal.  Left Atrium: Left atrial size was moderately dilated.  Right Atrium: Right atrial size was normal in size.  Pericardium: There is no evidence of pericardial effusion.  Mitral Valve: The mitral valve is normal in structure. There is severe holosystolic prolapse of posterior leaflet of the mitral valve. Severe mitral valve regurgitation. No evidence of mitral valve stenosis.  Tricuspid Valve: The tricuspid valve is normal in structure. Tricuspid valve regurgitation is mild . No evidence of tricuspid stenosis.  Aortic Valve: The aortic valve is tricuspid. Aortic valve regurgitation is not visualized. Aortic valve sclerosis is present, with no evidence of aortic valve stenosis.  Pulmonic Valve: The pulmonic valve was normal in structure. Pulmonic valve regurgitation is trivial. No evidence of pulmonic stenosis.  Aorta: Aortic dilatation noted. There is borderline dilatation of the aortic root, measuring 39 mm. There is mild dilatation of the ascending aorta, measuring 42 mm.  Venous: The inferior vena cava is normal in size with greater than 50% respiratory variability, suggesting right atrial pressure of 3 mmHg.  IAS/Shunts: No atrial level shunt detected by color flow Doppler.  Additional Comments: Severe prolapse of posterior MV leaflet with eccentric MR; likely severe; suggest TEE to further assess.   LEFT VENTRICLE PLAX 2D LVIDd:         4.80 cm LVIDs:         2.20 cm   2D Longitudinal Strain LV PW:         0.80 cm   2D Strain GLS Avg:     -21.3 % LV IVS:        1.00 cm LVOT diam:     2.00 cm LV SV:         64 LV SV Index:   33        3D Volume EF: LVOT Area:     3.14  cm  3D  EF:        70 % LV EDV:       150 ml LV ESV:       46 ml LV SV:        105 ml  RIGHT VENTRICLE RV Basal diam:  4.60 cm TAPSE (M-mode): 3.4 cm RVSP:           29.6 mmHg  LEFT ATRIUM            Index        RIGHT ATRIUM           Index LA diam:      4.50 cm  2.31 cm/m   RA Pressure: 3.00 mmHg LA Vol (A2C): 118.0 ml 60.46 ml/m  RA Area:     19.60 cm LA Vol (A4C): 61.9 ml  31.72 ml/m  RA Volume:   57.30 ml  29.36 ml/m AORTIC VALVE             PULMONIC VALVE LVOT Vmax:   126.00 cm/s PR End Diast Vel: 8.76 msec LVOT Vmean:  70.300 cm/s LVOT VTI:    0.205 m  AORTA Ao Root diam: 3.90 cm Ao Asc diam:  4.20 cm  MITRAL VALVE               TRICUSPID VALVE MV Area (PHT): 4.15 cm    TR Peak grad:   26.6 mmHg MV Decel Time: 183 msec    TR Vmax:        258.00 cm/s MR Peak grad: 103.8 mmHg   Estimated RAP:  3.00 mmHg MR Mean grad: 67.0 mmHg    RVSP:           29.6 mmHg MR Vmax:      509.50 cm/s MR Vmean:     386.5 cm/s   SHUNTS MV E velocity: 97.50 cm/s  Systemic VTI:  0.20 m MV A velocity: 77.00 cm/s  Systemic Diam: 2.00 cm MV E/A ratio:  1.27  Olga Millers MD Electronically signed by Olga Millers MD Signature Date/Time: 06/30/2022/3:52:06 PM    Final             08/15/2022: B Natriuretic Peptide 399.7 08/20/2022: ALT 105; Hemoglobin 12.2; Platelets 448 08/23/2022: BUN 12; Creatinine, Ser 0.54; Magnesium 2.1; Potassium 3.8; Sodium 137   STS RISK CALCULATOR:   NHYA CLASS:      ASSESSMENT AND PLAN:   Severe mitral valve regurgitation  Paroxysmal atrial fibrillation with RVR (HCC)  Primary hypertension  Other hyperlipidemia  Alcohol withdrawal syndrome with complication (HCC)    [name], MD  09/14/2022 3:56 PM    [facility] [address] [place], [place], [place]  [address] Phone: [phone]; Fax: [phone]

## 2022-09-13 ENCOUNTER — Other Ambulatory Visit: Payer: Self-pay | Admitting: Neurology

## 2022-09-14 ENCOUNTER — Ambulatory Visit: Payer: Medicare Other | Attending: Internal Medicine | Admitting: Internal Medicine

## 2022-09-14 ENCOUNTER — Encounter: Payer: Self-pay | Admitting: Internal Medicine

## 2022-09-14 DIAGNOSIS — E7849 Other hyperlipidemia: Secondary | ICD-10-CM

## 2022-09-14 DIAGNOSIS — I480 Paroxysmal atrial fibrillation: Secondary | ICD-10-CM

## 2022-09-14 DIAGNOSIS — I10 Essential (primary) hypertension: Secondary | ICD-10-CM

## 2022-09-14 DIAGNOSIS — F10939 Alcohol use, unspecified with withdrawal, unspecified: Secondary | ICD-10-CM

## 2022-09-14 DIAGNOSIS — I340 Nonrheumatic mitral (valve) insufficiency: Secondary | ICD-10-CM

## 2022-09-15 DIAGNOSIS — Z09 Encounter for follow-up examination after completed treatment for conditions other than malignant neoplasm: Secondary | ICD-10-CM | POA: Diagnosis not present

## 2022-09-15 DIAGNOSIS — I4891 Unspecified atrial fibrillation: Secondary | ICD-10-CM | POA: Diagnosis not present

## 2022-09-15 DIAGNOSIS — E43 Unspecified severe protein-calorie malnutrition: Secondary | ICD-10-CM | POA: Diagnosis not present

## 2022-09-15 DIAGNOSIS — I10 Essential (primary) hypertension: Secondary | ICD-10-CM | POA: Diagnosis not present

## 2022-09-20 ENCOUNTER — Other Ambulatory Visit: Payer: Self-pay

## 2022-09-20 ENCOUNTER — Encounter: Payer: Self-pay | Admitting: Rehabilitative and Restorative Service Providers"

## 2022-09-20 ENCOUNTER — Ambulatory Visit: Payer: Medicare Other | Attending: Family Medicine | Admitting: Rehabilitative and Restorative Service Providers"

## 2022-09-20 DIAGNOSIS — R5381 Other malaise: Secondary | ICD-10-CM | POA: Diagnosis not present

## 2022-09-20 DIAGNOSIS — R2689 Other abnormalities of gait and mobility: Secondary | ICD-10-CM | POA: Insufficient documentation

## 2022-09-20 DIAGNOSIS — M6281 Muscle weakness (generalized): Secondary | ICD-10-CM | POA: Diagnosis not present

## 2022-09-20 NOTE — Therapy (Signed)
OUTPATIENT PHYSICAL THERAPY LOWER EXTREMITY EVALUATION   Patient Name: James Ibarra MRN: [id number] DOB:[birth year], 72 y.o., male Today's Date: 09/20/2022  END OF SESSION:  PT End of Session - 09/20/22 0853     Visit Number 1    Date for PT Re-Evaluation 11/17/22    Authorization Type [employer] Medicare    Progress Note Due on Visit 10    PT Start Time 0845    PT Stop Time 0925    PT Time Calculation (min) 40 min    Activity Tolerance Patient tolerated treatment well    Behavior During Therapy [facility] for tasks assessed/performed             Past Medical History:  Diagnosis Date   History of nonmelanoma skin cancer    Hypercholesteremia    Hypertension    Trigeminal neuralgia    Past Surgical History:  Procedure Laterality Date   HERNIA REPAIR  11/07/11   [facility]   INGUINAL HERNIA REPAIR  11/07/2011   Procedure: LAPAROSCOPIC INGUINAL HERNIA;  Surgeon: Atilano Ina, MD,FACS;  Location: [facility];  Service: General;  Laterality: Left;   RETINAL DETACHMENT SURGERY  1997   SKIN CANCER EXCISION  2012, 1999   basal cell - neck and hand   Patient Active Problem List   Diagnosis Date Noted   Protein-calorie malnutrition, severe 08/19/2022   Alcohol withdrawal (HCC) 08/14/2022   Acute metabolic encephalopathy 08/14/2022   Paroxysmal atrial fibrillation with RVR (HCC) 08/14/2022   Severe mitral valve regurgitation 08/14/2022   Mitral valve prolapse 08/14/2022   PNA (pneumonia) 08/12/2022   Severe sepsis (HCC) 08/12/2022   HLD (hyperlipidemia) 08/12/2022   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   Trigeminal neuralgia of left side of face 07/15/2019   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  He states that he ended up having to go to the hospital and was in the ICU.  Pt reports that he has been weak and noted some decreased balance since prolonged time in the hospital bed.  Pt went for hospital follow up with Dr [name] and he was recommended PT.  PERTINENT HISTORY: Recent Pneumonia, Squamous Cell Carcinoma, HTN  PAIN:  Are you having pain? Yes: NPRS scale: 0-5/10 Pain location: Hip and lower back Pain description: aching Aggravating factors: sleeping in certain positions for hip pain Relieving factors: getting up and moving around  PRECAUTIONS: None  RED FLAGS: None   WEIGHT BEARING RESTRICTIONS: No  FALLS:  Has patient fallen in last 6 months? No  LIVING ENVIRONMENT: Lives with: lives with their spouse Lives in: House/apartment Stairs: Yes: Internal: 14 steps; can reach both and External: 8 steps; can reach both Has following equipment at home: shower chair and Grab bars  OCCUPATION: Retired, but worked as a Materials engineer  PLOF: Independent and Leisure: spending time with family, play guitar  PATIENT GOALS: To be steady on my feet and have more endurance and improve muscle tone.  NEXT MD VISIT: routine  yearly physical with Dr [name] in a couple of months  OBJECTIVE:    PATIENT SURVEYS:  LEFS 52 / 80 = 65.0 %  COGNITION: Overall cognitive status: Within functional limits for tasks assessed     SENSATION: Pt reports some numbness and tingling in his feet   MUSCLE LENGTH: Hamstrings: tightness noted bilat  POSTURE: rounded shoulders and forward head  LOWER EXTREMITY ROM:  WFL  LOWER EXTREMITY MMT:  09/20/2022:   Bilateral hip and hamstring strength of  4/5 Bilateral quad strength of 5/5   FUNCTIONAL TESTS:  5 times sit to stand: 16.32 sec with bilat UE (attempted sit to stand without hands, and unable to perform) 3 minute walk test: 494 ft with RPE of 2/10 Single leg Stance:  Right- 2.68 sec, Left- 2.73 sec  GAIT: Distance walked: >500 ft Assistive device utilized: None Level of assistance: Complete Independence Comments: Reports that he does get a little bit more winded than he did prior to his Pneumonia   TODAY'S TREATMENT:                                                                                                                              DATE: 09/20/2022  Review of HEP (see below) Nustep level 3 x5 min with PT present to discuss status   PATIENT EDUCATION:  Education details: Issued HEP Person educated: Patient Education method: Explanation, Demonstration, and Handouts Education comprehension: verbalized understanding and returned demonstration  HOME EXERCISE PROGRAM: Access Code: [email or web address] URL: [email or web address] Date: 09/20/2022 Prepared by: Clydie Braun Kylani Wires  Exercises - Seated Hamstring Stretch  - 1 x daily - 7 x weekly - 2 reps - 20 sec hold - Sit to Stand  - 1 x daily - 7 x weekly - 2 sets - 5 reps - Standing Hip Abduction with Counter Support  - 1 x daily - 7 x weekly - 2 sets - 10 reps - Standing Hip Extension with Counter Support  - 1 x daily - 7 x weekly - 2 sets - 10 reps - Standing March with Counter Support  - 1 x daily - 7 x weekly - 2 sets - 10 reps - Heel Raises with Counter Support  - 1 x daily - 7 x weekly - 2 sets - 10 reps  ASSESSMENT:  CLINICAL IMPRESSION: Patient is a 72 y.o. male who was seen today for physical therapy evaluation and treatment for deconditioning. Patient states that he was in his usual health and able to walk his dog, though he does admit that he had gotten more sedentary in the past year.  He states that he contracted a virus from his grandchild and ended up  in the hospital with Pneumonia.  Patient went to a SNF for rehab after and went for his hospital follow up with Dr [name], who referred pt to PT to assist with conditioning.  Patient presents with weakness, decreased balance, deconditioning, and decreased ability to perform functional tasks.  Patient would benefit from skilled PT to address his functional impairments.  OBJECTIVE IMPAIRMENTS: decreased balance, decreased endurance, difficulty walking, decreased strength, increased muscle spasms, impaired flexibility, postural dysfunction, and pain.   ACTIVITY LIMITATIONS: lifting, bending, and stairs  PARTICIPATION LIMITATIONS: cleaning, community activity, and yard work  PERSONAL FACTORS: Age, Past/current experiences, and 1 comorbidity: recent Pneumonia  are also affecting patient's functional outcome.   REHAB POTENTIAL: Good  CLINICAL DECISION MAKING: Stable/uncomplicated  EVALUATION COMPLEXITY: Low   GOALS: Goals reviewed with patient? Yes  SHORT TERM GOALS: Target date: 10/06/2022 Pt will be independent with initial HEP. Baseline: Goal status: INITIAL  2.  Patient will report ability to walk his dog without getting short of breath. Baseline:  Goal status: INITIAL   LONG TERM GOALS: Target date: 11/17/2022  Pt will be independent with advanced HEP. Baseline:  Goal status: INITIAL  2.  Pt will increase 3 minute walk test to greater 700 ft without dyspnea to allow for community ambulation. Baseline: 494 ft Goal status: INITIAL  3.  Pt will increase bilat hip strength to at least 4+/5 to allow him to perform sit to stand without UE use and navigate stairs in home with decreased difficulty. Baseline:  Goal status: INITIAL  4.  Pt will increase single leg stance to at least 15 seconds on each side to decrease risk of falling. Baseline: less than 3 seconds on each side Goal status: INITIAL  5.  Pt will improve 5 times sit to stand to 13 seconds or less without UE use to  demonstrate increased functional strength. Baseline: 13.32 sec Goal status: INITIAL    PLAN:  PT FREQUENCY: 2x/week  PT DURATION: 8 weeks  PLANNED INTERVENTIONS: Therapeutic exercises, Therapeutic activity, Neuromuscular re-education, Balance training, Gait training, Patient/Family education, Self Care, Joint mobilization, Joint manipulation, Stair training, Aquatic Therapy, Dry Needling, Electrical stimulation, Spinal manipulation, Spinal mobilization, Cryotherapy, Moist heat, Taping, Traction, Ultrasound, Ionotophoresis 4mg /ml Dexamethasone, Manual therapy, and Re-evaluation  PLAN FOR NEXT SESSION: assess and progress HEP as indicated, strengthening, gait   Reather Laurence, PT, DPT 09/20/22, 12:09 PM  [facility] [address] [place], [place] [address] Phone # [phone] Fax [phone]

## 2022-09-27 ENCOUNTER — Ambulatory Visit: Payer: Medicare Other | Admitting: Physical Therapy

## 2022-09-27 DIAGNOSIS — M6281 Muscle weakness (generalized): Secondary | ICD-10-CM

## 2022-09-27 DIAGNOSIS — R5381 Other malaise: Secondary | ICD-10-CM | POA: Diagnosis not present

## 2022-09-27 DIAGNOSIS — R2689 Other abnormalities of gait and mobility: Secondary | ICD-10-CM

## 2022-09-27 NOTE — Therapy (Signed)
OUTPATIENT PHYSICAL THERAPY LOWER EXTREMITY PROGRESS NOTE   Patient Name: James Ibarra MRN: [id number] DOB:[birth year], 72 y.o., male Today's Date: 09/27/2022  END OF SESSION:  PT End of Session - 09/27/22 0845     Visit Number 2    Date for PT Re-Evaluation 11/17/22    Authorization Type [employer] Medicare    Progress Note Due on Visit 10    PT Start Time 0845    PT Stop Time 0925    PT Time Calculation (min) 40 min    Activity Tolerance Patient tolerated treatment well             Past Medical History:  Diagnosis Date   History of nonmelanoma skin cancer    Hypercholesteremia    Hypertension    Trigeminal neuralgia    Past Surgical History:  Procedure Laterality Date   HERNIA REPAIR  11/07/11   [facility]   INGUINAL HERNIA REPAIR  11/07/2011   Procedure: LAPAROSCOPIC INGUINAL HERNIA;  Surgeon: Atilano Ina, MD,FACS;  Location: [facility];  Service: General;  Laterality: Left;   RETINAL DETACHMENT SURGERY  1997   SKIN CANCER EXCISION  2012, 1999   basal cell - neck and hand   Patient Active Problem List   Diagnosis Date Noted   Protein-calorie malnutrition, severe 08/19/2022   Alcohol withdrawal (HCC) 08/14/2022   Acute metabolic encephalopathy 08/14/2022   Paroxysmal atrial fibrillation with RVR (HCC) 08/14/2022   Severe mitral valve regurgitation 08/14/2022   Mitral valve prolapse 08/14/2022   PNA (pneumonia) 08/12/2022   Severe sepsis (HCC) 08/12/2022   HLD (hyperlipidemia) 08/12/2022   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   Trigeminal neuralgia of left side of face 07/15/2019   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  I've been lazy about doing the ex's.  The bike I have at home but it bothers my knee.     PERTINENT HISTORY: Recent Pneumonia, Squamous Cell Carcinoma, HTN  PAIN:  Are you having pain? Yes: NPRS scale: 0/10 Pain location: Hip and lower back Pain description: aching Aggravating factors: sleeping in certain positions for hip pain Relieving factors: getting up and moving around  PRECAUTIONS: None  RED FLAGS: None   WEIGHT BEARING RESTRICTIONS: No  FALLS:  Has patient fallen in last 6 months? No  LIVING ENVIRONMENT: Lives with: lives with their spouse Lives in: House/apartment Stairs: Yes: Internal: 14 steps; can reach both and External: 8 steps; can reach both Has following equipment at home: shower chair and Grab bars  OCCUPATION: Retired, but worked as a Materials engineer  PLOF: Independent and Leisure: spending time with family, play guitar  PATIENT GOALS: To be steady on my feet and have more endurance and improve muscle tone.  NEXT MD VISIT: routine yearly physical with Dr [name] in a couple of months  OBJECTIVE:    PATIENT SURVEYS:  LEFS 52 / 80 = 65.0 %  COGNITION: Overall cognitive status: Within functional limits for tasks assessed     SENSATION: Pt reports some numbness and tingling in  his feet   MUSCLE LENGTH: Hamstrings: tightness noted bilat  POSTURE: rounded shoulders and forward head  LOWER EXTREMITY ROM:  WFL  LOWER EXTREMITY MMT:  09/20/2022:   Bilateral hip and hamstring strength of 4/5 Bilateral quad strength of 5/5   FUNCTIONAL TESTS:  5 times sit to stand: 16.32 sec with bilat UE (attempted sit to stand without hands, and unable to perform) 3 minute walk test: 494 ft with RPE of 2/10 Single leg Stance:  Right- 2.68 sec, Left- 2.73  sec  GAIT: Distance walked: >500 ft Assistive device utilized: None Level of assistance: Complete Independence Comments: Reports that he does get a little bit more winded than he did prior to his Pneumonia   TODAY'S TREATMENT:     DATE: 7/24 Presession: O2 99% HR 53 Review of initial HEP:  seated HS stretch  sit to stand chair + cushion no hands 2 sets of 5 Standing at the counter hip abduction 10x right/left Standing at the counter hip extension 10x right/left  O2 97% HR 61 2 min rest break Counter push ups 15 5# KB reach downs to mid shin 5x each side 5# KB overhead press 5x right/left Nu-Step seat 46 old model L2 7 min 45 spm average O2 98% HR 54                                                                                                                             DATE: 09/20/2022  Review of HEP (see below) Nustep level 3 x5 min with PT present to discuss status   PATIENT EDUCATION:  Education details: Issued HEP Person educated: Patient Education method: Explanation, Demonstration, and Handouts Education comprehension: verbalized understanding and returned demonstration  HOME EXERCISE PROGRAM: Access Code: [email or web address] URL: [email or web address] Date: 09/20/2022 Prepared by: Clydie Braun Menke  Exercises - Seated Hamstring Stretch  - 1 x daily - 7 x weekly - 2 reps - 20 sec hold - Sit to Stand  - 1 x daily - 7 x weekly - 2 sets - 5 reps - Standing Hip Abduction with Counter Support  - 1 x daily - 7 x weekly - 2 sets - 10 reps - Standing Hip Extension with Counter Support  - 1 x daily - 7 x weekly - 2 sets - 10 reps - Standing March with Counter Support  - 1 x daily - 7 x weekly - 2 sets - 10 reps - Heel Raises with Counter Support  - 1 x daily - 7 x weekly - 2 sets - 10 reps  ASSESSMENT:  CLINICAL IMPRESSION: Able to rise from chair without UE assist with added foam pad to seat of standard chair.  O2 sats and HR levels in acceptable range throughout  session.  He reports he feels better post treatment and is pleased that he was able to rise from the chair with greater ease compared to last week.  Encouraged regular compliance with HEP for faster/greater benefit.  Therapist  monitoring response to all interventions and modifying treatment accordingly.     OBJECTIVE IMPAIRMENTS: decreased balance, decreased endurance, difficulty walking, decreased strength, increased muscle spasms, impaired flexibility, postural dysfunction, and pain.   ACTIVITY LIMITATIONS: lifting, bending, and stairs  PARTICIPATION LIMITATIONS: cleaning, community activity, and yard work  PERSONAL FACTORS: Age, Past/current experiences, and 1 comorbidity: recent Pneumonia  are also affecting patient's functional outcome.   REHAB POTENTIAL: Good  CLINICAL DECISION MAKING: Stable/uncomplicated  EVALUATION COMPLEXITY: Low   GOALS: Goals reviewed with patient? Yes  SHORT TERM GOALS: Target date: 10/06/2022 Pt will be independent with initial HEP. Baseline: Goal status: INITIAL  2.  Patient will report ability to walk his dog without getting short of breath. Baseline:  Goal status: INITIAL   LONG TERM GOALS: Target date: 11/17/2022  Pt will be independent with advanced HEP. Baseline:  Goal status: INITIAL  2.  Pt will increase 3 minute walk test to greater 700 ft without dyspnea to allow for community ambulation. Baseline: 494 ft Goal status: INITIAL  3.  Pt will increase bilat hip strength to at least 4+/5 to allow him to perform sit to stand without UE use and navigate stairs in home with decreased difficulty. Baseline:  Goal status: INITIAL  4.  Pt will increase single leg stance to at least 15 seconds on each side to decrease risk of falling. Baseline: less than 3 seconds on each side Goal status: INITIAL  5.  Pt will improve 5 times sit to stand to 13 seconds or less without UE use to demonstrate increased functional strength. Baseline: 13.32  sec Goal status: INITIAL    PLAN:  PT FREQUENCY: 2x/week  PT DURATION: 8 weeks  PLANNED INTERVENTIONS: Therapeutic exercises, Therapeutic activity, Neuromuscular re-education, Balance training, Gait training, Patient/Family education, Self Care, Joint mobilization, Joint manipulation, Stair training, Aquatic Therapy, Dry Needling, Electrical stimulation, Spinal manipulation, Spinal mobilization, Cryotherapy, Moist heat, Taping, Traction, Ultrasound, Ionotophoresis 4mg /ml Dexamethasone, Manual therapy, and Re-evaluation  PLAN FOR NEXT SESSION:  progress HEP as indicated, strengthening (has 3 and 5 pound weights at home), gait   Lavinia Sharps, PT 09/27/22 5:05 PM Phone: [phone] Fax: [phone]  [facility] [address] [place], [place] [address] Phone # [phone] Fax [phone]

## 2022-10-04 ENCOUNTER — Other Ambulatory Visit: Payer: Self-pay | Admitting: Internal Medicine

## 2022-10-04 ENCOUNTER — Ambulatory Visit: Payer: Self-pay | Admitting: Internal Medicine

## 2022-10-04 ENCOUNTER — Ambulatory Visit: Payer: Medicare Other | Attending: Internal Medicine | Admitting: Internal Medicine

## 2022-10-04 ENCOUNTER — Encounter: Payer: Self-pay | Admitting: Internal Medicine

## 2022-10-04 ENCOUNTER — Ambulatory Visit (INDEPENDENT_AMBULATORY_CARE_PROVIDER_SITE_OTHER): Payer: Medicare Other

## 2022-10-04 VITALS — BP 130/82 | HR 51 | Ht 74.0 in | Wt 154.4 lb

## 2022-10-04 DIAGNOSIS — E7849 Other hyperlipidemia: Secondary | ICD-10-CM

## 2022-10-04 DIAGNOSIS — I480 Paroxysmal atrial fibrillation: Secondary | ICD-10-CM

## 2022-10-04 DIAGNOSIS — F10939 Alcohol use, unspecified with withdrawal, unspecified: Secondary | ICD-10-CM

## 2022-10-04 DIAGNOSIS — I10 Essential (primary) hypertension: Secondary | ICD-10-CM | POA: Diagnosis not present

## 2022-10-04 DIAGNOSIS — I340 Nonrheumatic mitral (valve) insufficiency: Secondary | ICD-10-CM

## 2022-10-04 NOTE — H&P (View-Only) (Signed)
 Patient ID: James Ibarra MRN: [id number] DOB/AGE: 1950-07-02 72 y.o.  Primary Care Physician:Morrow, [name], MD Primary Cardiologist: Alverda Skeans  FOCUSED CARDIOVASCULAR PROBLEM LIST:   1.  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  Paroxysmal atrial fibrillation on Eliquis 3.  Hyperlipidemia 4.  Hypertension    HISTORY OF PRESENT ILLNESS: The patient is a 72 y.o. male with the indicated medical history here for recommendations regarding his mitral regurgitation.  The patient was admitted in June with a worsening shortness of breath and was found to have developed a right-sided pneumonia.  He was treated with ceftriaxone and azithromycin.  He also developed new onset atrial fibrillation requiring Cardizem drip.  An echocardiogram during this admission demonstrated severe mitral regurgitation thought to be due to posterior prolapse.  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  He was discharged to SNF on Eliquis and metoprolol.  Since discharge he has quit using alcohol.  He is still trying to regain his strength.  He tells me he had just noticed palpitations especially when he lays on his left side.  This has been going on for about 2 months prior to being admitted to the hospital.  He is still regaining his strength and is somewhat short of breath at times.  He denies any paroxysmal nocturnal dyspnea, exertional angina, severe bleeding or bruising, signs or symptoms of stroke, or paroxysmal nocturnal dyspnea.  He tells me he is known about his murmur for at least 6 months.  His PCP noted it and he was referred to cardiology.  An echocardiogram in April demonstrated what looks to be severe degenerative mitral regurgitation due to posterior prolapse.  He was scheduled to see [place] in June however he was hospitalized and this was canceled.  He sees a Education officer, community on a regular basis and has no concerns about his  dental health.  Past Medical History:  Diagnosis Date   History of nonmelanoma skin cancer    Hypercholesteremia    Hypertension    Trigeminal neuralgia     Past Surgical History:  Procedure Laterality Date   HERNIA REPAIR  11/07/11   [facility]   INGUINAL HERNIA REPAIR  11/07/2011   Procedure: LAPAROSCOPIC INGUINAL HERNIA;  Surgeon: Atilano Ina, MD,FACS;  Location: [facility];  Service: General;  Laterality: Left;   RETINAL DETACHMENT SURGERY  1997   SKIN CANCER EXCISION  2012, 1999   basal cell - neck and hand    Family History  Problem Relation Age of Onset   Heart disease Father    Heart disease Mother     Social History   Socioeconomic History   Marital status: Married    Spouse name: Not on file   Number of children: Not on file   Years of education: Not on file   Highest education level: Not on file  Occupational History   Not on file  Tobacco Use   Smoking status: Never   Smokeless tobacco: Never  Substance and Sexual Activity   Alcohol use: Yes    Comment: 3 glasses of wine a day   Drug use: No   Sexual activity: Not on file  Other Topics Concern   Not on file  Social History Narrative   Not on file   Social Determinants of Health   Financial Resource Strain: Not on file  Food Insecurity: No Food Insecurity (08/12/2022)   Hunger Vital Sign    Worried About  Running Out of Food in the Last Year: Never true    Ran Out of Food in the Last Year: Never true  Transportation Needs: No Transportation Needs (08/12/2022)   PRAPARE - Administrator, Civil Service (Medical): No    Lack of Transportation (Non-Medical): No  Physical Activity: Not on file  Stress: Not on file  Social Connections: Not on file  Intimate Partner Violence: Not At Risk (08/12/2022)   Humiliation, Afraid, Rape, and Kick questionnaire    Fear of Current or Ex-Partner: No    Emotionally Abused: No    Physically Abused: No    Sexually Abused: No     Prior to Admission medications    Medication Sig Start Date End Date Taking? Authorizing Provider  albuterol (VENTOLIN HFA) 108 (90 Base) MCG/ACT inhaler Inhale 1 puff into the lungs every 6 (six) hours as needed for wheezing or shortness of breath. 08/11/22   [provider]  apixaban (ELIQUIS) 5 MG TABS tablet Take 1 tablet (5 mg total) by mouth 2 (two) times daily. 08/25/22   Ghimire, Werner Lean, MD  folic acid (FOLVITE) 1 MG tablet Take 1 tablet (1 mg total) by mouth daily. 08/25/22   Ghimire, Werner Lean, MD  gabapentin (NEURONTIN) 100 MG capsule Take 2 capsules (200 mg total) by mouth 2 (two) times daily. 08/25/22   Ghimire, Werner Lean, MD  ketoconazole (NIZORAL) 2 % cream Apply 1 Application topically as needed for irritation. 01/16/13   [provider]  melatonin 3 MG TABS tablet Take 3 mg by mouth at bedtime as needed.    [provider]  metoprolol tartrate (LOPRESSOR) 25 MG tablet Take 1 tablet (25 mg total) by mouth 2 (two) times daily. 08/25/22   Ghimire, Werner Lean, MD  Multiple Vitamins-Minerals (ICAPS AREDS 2 PO) Take 1 capsule by mouth in the morning and at bedtime.    [provider]  pravastatin (PRAVACHOL) 10 MG tablet Take 10 mg by mouth at bedtime.    [provider]  sildenafil (REVATIO) 20 MG tablet Take 2-5 tablets for ED orally once a day if needed    [provider]  thiamine (VITAMIN B-1) 100 MG tablet Take 1 tablet (100 mg total) by mouth daily. 08/25/22   Ghimire, Werner Lean, MD  traZODone (DESYREL) 100 MG tablet Take 50-100 mg by mouth at bedtime as needed.     [provider]    No Known Allergies  REVIEW OF SYSTEMS:  General: no fevers/chills/night sweats Eyes: no blurry vision, diplopia, or amaurosis ENT: no sore throat or hearing loss Resp: no cough, wheezing, or hemoptysis CV: no edema or palpitations GI: no abdominal pain, nausea, vomiting, diarrhea, or constipation GU: no dysuria, frequency, or hematuria Skin: no rash Neuro: no  headache, numbness, tingling, or weakness of extremities Musculoskeletal: no joint pain or swelling Heme: no bleeding, DVT, or easy bruising Endo: no polydipsia or polyuria  BP 130/82   Pulse (!) 51   Ht 6\' 2"  (1.88 m)   Wt 154 lb 6.4 oz (70 kg)   SpO2 98%   BMI 19.82 kg/m   PHYSICAL EXAM: GEN:  AO x 3 in no acute distress HEENT: normal Dentition: Normal Neck: JVP normal. +2 carotid upstrokes without bruits. No thyromegaly. Lungs: equal expansion, clear bilaterally CV: Apex is discrete and nondisplaced, RRR with 3/6 holosystolic murmur Abd: soft, non-tender, non-distended; no bruit; positive bowel sounds Ext: no edema, ecchymoses, or cyanosis Vascular: 2+ femoral pulses, 2+ radial pulses  Skin: warm and dry without rash Neuro: CN II-XII grossly intact; motor and sensory grossly intact    DATA AND STUDIES:   EKG Interpretation Date/Time:  Wednesday October 04 2022 09:08:44 EDT Ventricular Rate:  51 PR Interval:  186 QRS Duration:  90 QT Interval:  452 QTC Calculation: 416 R Axis:   23  Text Interpretation: Sinus bradycardia T wave abnormality, consider inferior ischemia When compared with ECG of 17-Aug-2022 22:56, Sinus rhythm has replaced Atrial fibrillation Vent. rate has decreased BY  77 BPM T wave inversion now evident in Inferior leads Confirmed by Alverda Skeans ([id number]) on 10/04/2022 9:11:01 AM    Cardiac Studies & Procedures       ECHOCARDIOGRAM  ECHOCARDIOGRAM COMPLETE 06/30/2022  Narrative ECHOCARDIOGRAM REPORT    Patient Name:   James Ibarra Date of Exam: 06/30/2022 Medical Rec #:  [id number]       Height:       73.0 in Accession #:    [id number]      Weight:       159.0 lb Date of Birth:  Jul 21, 1950       BSA:          1.952 m Patient Age:    71 years        BP:           141/79 mmHg Patient Gender: M               HR:           64 bpm. Exam Location:  [address]  Procedure: 2D Echo, 3D Echo, Cardiac Doppler, Color Doppler and Strain  Analysis  Indications:    R01.1 Murmur  History:        Patient has no prior history of Echocardiogram examinations. Risk Factors:Hypertension and Dyslipidemia.  Sonographer:    Cathie Beams RCS Referring Phys: Farris Has  IMPRESSIONS   1. Severe prolapse of posterior MV leaflet with eccentric MR; likely severe; suggest TEE to further assess. 2. Left ventricular ejection fraction, by estimation, is 60 to 65%. The left ventricle has normal function. The left ventricle has no regional wall motion abnormalities. Left ventricular diastolic function could not be evaluated. The average left ventricular global longitudinal strain is -21.3 %. The global longitudinal strain is normal. 3. Right ventricular systolic function is normal. The right ventricular size is mildly enlarged. 4. Left atrial size was moderately dilated. 5. The mitral valve is normal in structure. Severe mitral valve regurgitation. No evidence of mitral stenosis. There is severe holosystolic prolapse of posterior leaflet of the mitral valve. 6. The aortic valve is tricuspid. Aortic valve regurgitation is not visualized. Aortic valve sclerosis is present, with no evidence of aortic valve stenosis. 7. Aortic dilatation noted. There is borderline dilatation of the aortic root, measuring 39 mm. There is mild dilatation of the ascending aorta, measuring 42 mm. 8. The inferior vena cava is normal in size with greater than 50% respiratory variability, suggesting right atrial pressure of 3 mmHg.  Comparison(s): No prior Echocardiogram.  FINDINGS Left Ventricle: Left ventricular ejection fraction, by estimation, is 60 to 65%. The left ventricle has normal function. The left ventricle has no regional wall motion abnormalities. The average left ventricular global longitudinal strain is -21.3 %. The global longitudinal strain is normal. The left ventricular internal cavity size was normal in size. There is no left ventricular  hypertrophy. Left ventricular diastolic function could not be evaluated.  Right Ventricle: The right ventricular size is mildly enlarged.  Right ventricular systolic function is normal.  Left Atrium: Left atrial size was moderately dilated.  Right Atrium: Right atrial size was normal in size.  Pericardium: There is no evidence of pericardial effusion.  Mitral Valve: The mitral valve is normal in structure. There is severe holosystolic prolapse of posterior leaflet of the mitral valve. Severe mitral valve regurgitation. No evidence of mitral valve stenosis.  Tricuspid Valve: The tricuspid valve is normal in structure. Tricuspid valve regurgitation is mild . No evidence of tricuspid stenosis.  Aortic Valve: The aortic valve is tricuspid. Aortic valve regurgitation is not visualized. Aortic valve sclerosis is present, with no evidence of aortic valve stenosis.  Pulmonic Valve: The pulmonic valve was normal in structure. Pulmonic valve regurgitation is trivial. No evidence of pulmonic stenosis.  Aorta: Aortic dilatation noted. There is borderline dilatation of the aortic root, measuring 39 mm. There is mild dilatation of the ascending aorta, measuring 42 mm.  Venous: The inferior vena cava is normal in size with greater than 50% respiratory variability, suggesting right atrial pressure of 3 mmHg.  IAS/Shunts: No atrial level shunt detected by color flow Doppler.  Additional Comments: Severe prolapse of posterior MV leaflet with eccentric MR; likely severe; suggest TEE to further assess.   LEFT VENTRICLE PLAX 2D LVIDd:         4.80 cm LVIDs:         2.20 cm   2D Longitudinal Strain LV PW:         0.80 cm   2D Strain GLS Avg:     -21.3 % LV IVS:        1.00 cm LVOT diam:     2.00 cm LV SV:         64 LV SV Index:   33        3D Volume EF: LVOT Area:     3.14 cm  3D EF:        70 % LV EDV:       150 ml LV ESV:       46 ml LV SV:        105 ml  RIGHT VENTRICLE RV Basal diam:  4.60  cm TAPSE (M-mode): 3.4 cm RVSP:           29.6 mmHg  LEFT ATRIUM            Index        RIGHT ATRIUM           Index LA diam:      4.50 cm  2.31 cm/m   RA Pressure: 3.00 mmHg LA Vol (A2C): 118.0 ml 60.46 ml/m  RA Area:     19.60 cm LA Vol (A4C): 61.9 ml  31.72 ml/m  RA Volume:   57.30 ml  29.36 ml/m AORTIC VALVE             PULMONIC VALVE LVOT Vmax:   126.00 cm/s PR End Diast Vel: 8.76 msec LVOT Vmean:  70.300 cm/s LVOT VTI:    0.205 m  AORTA Ao Root diam: 3.90 cm Ao Asc diam:  4.20 cm  MITRAL VALVE               TRICUSPID VALVE MV Area (PHT): 4.15 cm    TR Peak grad:   26.6 mmHg MV Decel Time: 183 msec    TR Vmax:        258.00 cm/s MR Peak grad: 103.8 mmHg   Estimated RAP:  3.00 mmHg MR  Mean grad: 67.0 mmHg    RVSP:           29.6 mmHg MR Vmax:      509.50 cm/s MR Vmean:     386.5 cm/s   SHUNTS MV E velocity: 97.50 cm/s  Systemic VTI:  0.20 m MV A velocity: 77.00 cm/s  Systemic Diam: 2.00 cm MV E/A ratio:  1.27  Olga Millers MD Electronically signed by Olga Millers MD Signature Date/Time: 06/30/2022/3:52:06 PM    Final             08/15/2022: B Natriuretic Peptide 399.7 08/20/2022: ALT 105; Hemoglobin 12.2; Platelets 448 08/23/2022: BUN 12; Creatinine, Ser 0.54; Magnesium 2.1; Potassium 3.8; Sodium 137   STS RISK CALCULATOR: Pending  NHYA CLASS: 2     ASSESSMENT AND PLAN:   Severe mitral valve regurgitation - Plan: EKG 12-Lead, CBC, Comprehensive metabolic panel, CANCELED: LONG TERM MONITOR (3-14 DAYS)  Paroxysmal atrial fibrillation with RVR (HCC) - Plan: CBC, Comprehensive metabolic panel, CANCELED: LONG TERM MONITOR (3-14 DAYS)  Primary hypertension  Other hyperlipidemia - Plan: CBC, Comprehensive metabolic panel, Lipid panel, Lipoprotein A (LPA), CANCELED: LONG TERM MONITOR (3-14 DAYS)  Alcohol withdrawal syndrome with complication (HCC)  I viewed the patient's echocardiogram.  It looks like he has developed severe degenerative mitral  regurgitation.  He is also developed atrial fibrillation but I am unsure about whether this is related to the valve or whether this was due to his episode of sepsis.  His left atrium does not seem enlarged.  I had a long talk with the patient about the pathophysiology of degenerative mitral regurgitation.  He really does not have much in way of severe or serious comorbid conditions.  For this reason I will refer him for a transesophageal echocardiogram, coronary angiography and right heart catheterization, and a 2-week monitor.  After all of this data has been reviewed I will refer him to Dr. Leafy Ro for consideration of mitral valve repair and potentially a maze procedure.  I did discuss with him the concept of mitral transcatheter edge-to-edge repair with him as well.  Otherwise I will see the patient back in 6 months.    I have personally reviewed the patients imaging data as summarized above.  I have reviewed the natural history of mitral regurgitation with the patient and family members who are present today. We have discussed the limitations of medical therapy and the poor prognosis associated with symptomatic mitral regurgitation. We have also reviewed potential treatment options, including palliative medical therapy, conventional mitral surgery, and transcatheter mitral edge-to-edge repair. We discussed treatment options in the context of this patient's specific comorbid medical conditions.   All of the patient's questions were answered today. Will make further recommendations based on the results of studies outlined above.   Total time spent with patient today 60 minutes. This includes reviewing records, evaluating the patient and coordinating care.   [name], MD  10/04/2022 10:21 AM    [facility] [address] [place], [place], [place]  [address] Phone: [phone]; Fax: [phone]

## 2022-10-04 NOTE — Progress Notes (Unsigned)
ZIO XT serial # [id number] from office inventory applied to patient.

## 2022-10-04 NOTE — Patient Instructions (Addendum)
Medication Instructions:  No changes *If you need a refill on your cardiac medications before your next appointment, please call your pharmacy*   Lab Work: Today: cbc, cmet, lipids, lp(a) If you have labs (blood work) drawn today and your tests are completely normal, you will receive your results only by: MyChart Message (if you have MyChart) OR A paper copy in the mail If you have any lab test that is abnormal or we need to change your treatment, we will call you to review the results.   Testing/Procedures: Your physician has requested that you have a cardiac catheterization. Cardiac catheterization is used to diagnose and/or treat various heart conditions. Doctors may recommend this procedure for a number of different reasons. The most common reason is to evaluate chest pain. Chest pain can be a symptom of coronary artery disease (CAD), and cardiac catheterization can show whether plaque is narrowing or blocking your heart's arteries. This procedure is also used to evaluate the valves, as well as measure the blood flow and oxygen levels in different parts of your heart. For further information please visit [email or web address] Please follow instruction sheet, as given.  Your physician has requested that you have a TEE. During a TEE, sound waves are used to create images of your heart. It provides your doctor with information about the size and shape of your heart and how well your heart's chambers and valves are working. In this test, a transducer is attached to the end of a flexible tube that's guided down your throat and into your esophagus (the tube leading from you mouth to your stomach) to get a more detailed image of your heart. You are not awake for the procedure. Please see the instruction sheet given to you today. For further information please visit [email or web address]  Zio Heart Monitor 14 days - applied today in office   Follow-Up: At [facility], you and your health  needs are our priority.  As part of our continuing mission to provide you with exceptional heart care, we have created designated Provider Care Teams.  These Care Teams include your primary Cardiologist (physician) and Advanced Practice Providers (APPs -  Physician Assistants and Nurse Practitioners) who all work together to provide you with the care you need, when you need it.    Your next appointment:   6 month(s)  Provider:   Alverda Skeans, MD         Cardiac/Peripheral Catheterization   You are scheduled for a Cardiac Catheterization on Thursday, August 8 with Dr. Alverda Skeans.  1. Please arrive at the [facility] (Main Entrance A) at [facility]: [address] [place], [place] [address] at 8:30 AM (This time is TWO hour(s) before your procedure to ensure your preparation). Free valet parking service is available. You will check in at ADMITTING. The support person will be asked to wait in the waiting room.  It is OK to have someone drop you off and come back when you are ready to be discharged.        Special note: Every effort is made to have your procedure done on time. Please understand that emergencies sometimes delay scheduled procedures.  2. Diet: Do not eat solid foods after midnight.  You may have clear liquids until 5 AM the day of the procedure.  3. Labs: You will need to have blood drawn TODAY.  You do not need to be fasting.  4. Medication instructions in preparation for your procedure:   Contrast Allergy: No  HOLD ELIQUIS AFTER MONDAY 10/09/22  On the morning of your procedure, take Aspirin 81 mg and any morning medicines NOT listed above.  You may use sips of water.  5. Plan to go home the same day, you will only stay overnight if medically necessary. 6. You MUST have a responsible adult to drive you home. 7. An adult MUST be with you the first 24 hours after you arrive home. 8. Bring a current list of your medications, and the last time and date  medication taken. 9. Bring ID and current insurance cards. 10.Please wear clothes that are easy to get on and off and wear slip-on shoes.  Thank you for allowing [place] to care for you!   -- [facility]  TEE INSTRUCTIONS: You are scheduled for a TEE on 10/16/22 with Dr Izora Ribas.  Please arrive at the [facility] (Main Entrance A) at [facility]: [address] [place], [place] [address] at 10:30 am.  Bonita Quin are allowed ONE visitor in the waiting room during your procedure. Both you and your guest must wear masks.  DIET: Nothing to eat or drink after midnight except a sip of water with medications.  MEDICATION INSTRUCTIONS:  You may take your other medications as directed with sips of water.  LABS:  For patients receiving anesthesia for TEE: BMET, CBC within 1 week --completed 09/3122  You must have a responsible person to drive you home and stay in the waiting area during your procedure. Failure to do so could result in cancellation.  Bring your insurance cards.  *Special Note: Every effort is made to have your procedure done on time. Occasionally there are emergencies that occur at the hospital that may cause delays. Please be patient if a delay does occur.

## 2022-10-04 NOTE — Progress Notes (Signed)
Patient ID: James Ibarra MRN: [id number] DOB/AGE: 1950-07-02 72 y.o.  Primary Care Physician:Morrow, [name], MD Primary Cardiologist: Alverda Skeans  FOCUSED CARDIOVASCULAR PROBLEM LIST:   1.  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  Paroxysmal atrial fibrillation on Eliquis 3.  Hyperlipidemia 4.  Hypertension    HISTORY OF PRESENT ILLNESS: The patient is a 72 y.o. male with the indicated medical history here for recommendations regarding his mitral regurgitation.  The patient was admitted in June with a worsening shortness of breath and was found to have developed a right-sided pneumonia.  He was treated with ceftriaxone and azithromycin.  He also developed new onset atrial fibrillation requiring Cardizem drip.  An echocardiogram during this admission demonstrated severe mitral regurgitation thought to be due to posterior prolapse.  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  He was discharged to SNF on Eliquis and metoprolol.  Since discharge he has quit using alcohol.  He is still trying to regain his strength.  He tells me he had just noticed palpitations especially when he lays on his left side.  This has been going on for about 2 months prior to being admitted to the hospital.  He is still regaining his strength and is somewhat short of breath at times.  He denies any paroxysmal nocturnal dyspnea, exertional angina, severe bleeding or bruising, signs or symptoms of stroke, or paroxysmal nocturnal dyspnea.  He tells me he is known about his murmur for at least 6 months.  His PCP noted it and he was referred to cardiology.  An echocardiogram in April demonstrated what looks to be severe degenerative mitral regurgitation due to posterior prolapse.  He was scheduled to see [place] in June however he was hospitalized and this was canceled.  He sees a Education officer, community on a regular basis and has no concerns about his  dental health.  Past Medical History:  Diagnosis Date   History of nonmelanoma skin cancer    Hypercholesteremia    Hypertension    Trigeminal neuralgia     Past Surgical History:  Procedure Laterality Date   HERNIA REPAIR  11/07/11   [facility]   INGUINAL HERNIA REPAIR  11/07/2011   Procedure: LAPAROSCOPIC INGUINAL HERNIA;  Surgeon: Atilano Ina, MD,FACS;  Location: [facility];  Service: General;  Laterality: Left;   RETINAL DETACHMENT SURGERY  1997   SKIN CANCER EXCISION  2012, 1999   basal cell - neck and hand    Family History  Problem Relation Age of Onset   Heart disease Father    Heart disease Mother     Social History   Socioeconomic History   Marital status: Married    Spouse name: Not on file   Number of children: Not on file   Years of education: Not on file   Highest education level: Not on file  Occupational History   Not on file  Tobacco Use   Smoking status: Never   Smokeless tobacco: Never  Substance and Sexual Activity   Alcohol use: Yes    Comment: 3 glasses of wine a day   Drug use: No   Sexual activity: Not on file  Other Topics Concern   Not on file  Social History Narrative   Not on file   Social Determinants of Health   Financial Resource Strain: Not on file  Food Insecurity: No Food Insecurity (08/12/2022)   Hunger Vital Sign    Worried About  Running Out of Food in the Last Year: Never true    Ran Out of Food in the Last Year: Never true  Transportation Needs: No Transportation Needs (08/12/2022)   PRAPARE - Administrator, Civil Service (Medical): No    Lack of Transportation (Non-Medical): No  Physical Activity: Not on file  Stress: Not on file  Social Connections: Not on file  Intimate Partner Violence: Not At Risk (08/12/2022)   Humiliation, Afraid, Rape, and Kick questionnaire    Fear of Current or Ex-Partner: No    Emotionally Abused: No    Physically Abused: No    Sexually Abused: No     Prior to Admission medications    Medication Sig Start Date End Date Taking? Authorizing Provider  albuterol (VENTOLIN HFA) 108 (90 Base) MCG/ACT inhaler Inhale 1 puff into the lungs every 6 (six) hours as needed for wheezing or shortness of breath. 08/11/22   [provider]  apixaban (ELIQUIS) 5 MG TABS tablet Take 1 tablet (5 mg total) by mouth 2 (two) times daily. 08/25/22   Ghimire, Werner Lean, MD  folic acid (FOLVITE) 1 MG tablet Take 1 tablet (1 mg total) by mouth daily. 08/25/22   Ghimire, Werner Lean, MD  gabapentin (NEURONTIN) 100 MG capsule Take 2 capsules (200 mg total) by mouth 2 (two) times daily. 08/25/22   Ghimire, Werner Lean, MD  ketoconazole (NIZORAL) 2 % cream Apply 1 Application topically as needed for irritation. 01/16/13   [provider]  melatonin 3 MG TABS tablet Take 3 mg by mouth at bedtime as needed.    [provider]  metoprolol tartrate (LOPRESSOR) 25 MG tablet Take 1 tablet (25 mg total) by mouth 2 (two) times daily. 08/25/22   Ghimire, Werner Lean, MD  Multiple Vitamins-Minerals (ICAPS AREDS 2 PO) Take 1 capsule by mouth in the morning and at bedtime.    [provider]  pravastatin (PRAVACHOL) 10 MG tablet Take 10 mg by mouth at bedtime.    [provider]  sildenafil (REVATIO) 20 MG tablet Take 2-5 tablets for ED orally once a day if needed    [provider]  thiamine (VITAMIN B-1) 100 MG tablet Take 1 tablet (100 mg total) by mouth daily. 08/25/22   Ghimire, Werner Lean, MD  traZODone (DESYREL) 100 MG tablet Take 50-100 mg by mouth at bedtime as needed.     [provider]    No Known Allergies  REVIEW OF SYSTEMS:  General: no fevers/chills/night sweats Eyes: no blurry vision, diplopia, or amaurosis ENT: no sore throat or hearing loss Resp: no cough, wheezing, or hemoptysis CV: no edema or palpitations GI: no abdominal pain, nausea, vomiting, diarrhea, or constipation GU: no dysuria, frequency, or hematuria Skin: no rash Neuro: no  headache, numbness, tingling, or weakness of extremities Musculoskeletal: no joint pain or swelling Heme: no bleeding, DVT, or easy bruising Endo: no polydipsia or polyuria  BP 130/82   Pulse (!) 51   Ht 6\' 2"  (1.88 m)   Wt 154 lb 6.4 oz (70 kg)   SpO2 98%   BMI 19.82 kg/m   PHYSICAL EXAM: GEN:  AO x 3 in no acute distress HEENT: normal Dentition: Normal Neck: JVP normal. +2 carotid upstrokes without bruits. No thyromegaly. Lungs: equal expansion, clear bilaterally CV: Apex is discrete and nondisplaced, RRR with 3/6 holosystolic murmur Abd: soft, non-tender, non-distended; no bruit; positive bowel sounds Ext: no edema, ecchymoses, or cyanosis Vascular: 2+ femoral pulses, 2+ radial pulses  Skin: warm and dry without rash Neuro: CN II-XII grossly intact; motor and sensory grossly intact    DATA AND STUDIES:   EKG Interpretation Date/Time:  Wednesday October 04 2022 09:08:44 EDT Ventricular Rate:  51 PR Interval:  186 QRS Duration:  90 QT Interval:  452 QTC Calculation: 416 R Axis:   23  Text Interpretation: Sinus bradycardia T wave abnormality, consider inferior ischemia When compared with ECG of 17-Aug-2022 22:56, Sinus rhythm has replaced Atrial fibrillation Vent. rate has decreased BY  77 BPM T wave inversion now evident in Inferior leads Confirmed by Alverda Skeans ([id number]) on 10/04/2022 9:11:01 AM    Cardiac Studies & Procedures       ECHOCARDIOGRAM  ECHOCARDIOGRAM COMPLETE 06/30/2022  Narrative ECHOCARDIOGRAM REPORT    Patient Name:   James Ibarra Date of Exam: 06/30/2022 Medical Rec #:  [id number]       Height:       73.0 in Accession #:    [id number]      Weight:       159.0 lb Date of Birth:  Jul 21, 1950       BSA:          1.952 m Patient Age:    71 years        BP:           141/79 mmHg Patient Gender: M               HR:           64 bpm. Exam Location:  [address]  Procedure: 2D Echo, 3D Echo, Cardiac Doppler, Color Doppler and Strain  Analysis  Indications:    R01.1 Murmur  History:        Patient has no prior history of Echocardiogram examinations. Risk Factors:Hypertension and Dyslipidemia.  Sonographer:    Cathie Beams RCS Referring Phys: Farris Has  IMPRESSIONS   1. Severe prolapse of posterior MV leaflet with eccentric MR; likely severe; suggest TEE to further assess. 2. Left ventricular ejection fraction, by estimation, is 60 to 65%. The left ventricle has normal function. The left ventricle has no regional wall motion abnormalities. Left ventricular diastolic function could not be evaluated. The average left ventricular global longitudinal strain is -21.3 %. The global longitudinal strain is normal. 3. Right ventricular systolic function is normal. The right ventricular size is mildly enlarged. 4. Left atrial size was moderately dilated. 5. The mitral valve is normal in structure. Severe mitral valve regurgitation. No evidence of mitral stenosis. There is severe holosystolic prolapse of posterior leaflet of the mitral valve. 6. The aortic valve is tricuspid. Aortic valve regurgitation is not visualized. Aortic valve sclerosis is present, with no evidence of aortic valve stenosis. 7. Aortic dilatation noted. There is borderline dilatation of the aortic root, measuring 39 mm. There is mild dilatation of the ascending aorta, measuring 42 mm. 8. The inferior vena cava is normal in size with greater than 50% respiratory variability, suggesting right atrial pressure of 3 mmHg.  Comparison(s): No prior Echocardiogram.  FINDINGS Left Ventricle: Left ventricular ejection fraction, by estimation, is 60 to 65%. The left ventricle has normal function. The left ventricle has no regional wall motion abnormalities. The average left ventricular global longitudinal strain is -21.3 %. The global longitudinal strain is normal. The left ventricular internal cavity size was normal in size. There is no left ventricular  hypertrophy. Left ventricular diastolic function could not be evaluated.  Right Ventricle: The right ventricular size is mildly enlarged.  Right ventricular systolic function is normal.  Left Atrium: Left atrial size was moderately dilated.  Right Atrium: Right atrial size was normal in size.  Pericardium: There is no evidence of pericardial effusion.  Mitral Valve: The mitral valve is normal in structure. There is severe holosystolic prolapse of posterior leaflet of the mitral valve. Severe mitral valve regurgitation. No evidence of mitral valve stenosis.  Tricuspid Valve: The tricuspid valve is normal in structure. Tricuspid valve regurgitation is mild . No evidence of tricuspid stenosis.  Aortic Valve: The aortic valve is tricuspid. Aortic valve regurgitation is not visualized. Aortic valve sclerosis is present, with no evidence of aortic valve stenosis.  Pulmonic Valve: The pulmonic valve was normal in structure. Pulmonic valve regurgitation is trivial. No evidence of pulmonic stenosis.  Aorta: Aortic dilatation noted. There is borderline dilatation of the aortic root, measuring 39 mm. There is mild dilatation of the ascending aorta, measuring 42 mm.  Venous: The inferior vena cava is normal in size with greater than 50% respiratory variability, suggesting right atrial pressure of 3 mmHg.  IAS/Shunts: No atrial level shunt detected by color flow Doppler.  Additional Comments: Severe prolapse of posterior MV leaflet with eccentric MR; likely severe; suggest TEE to further assess.   LEFT VENTRICLE PLAX 2D LVIDd:         4.80 cm LVIDs:         2.20 cm   2D Longitudinal Strain LV PW:         0.80 cm   2D Strain GLS Avg:     -21.3 % LV IVS:        1.00 cm LVOT diam:     2.00 cm LV SV:         64 LV SV Index:   33        3D Volume EF: LVOT Area:     3.14 cm  3D EF:        70 % LV EDV:       150 ml LV ESV:       46 ml LV SV:        105 ml  RIGHT VENTRICLE RV Basal diam:  4.60  cm TAPSE (M-mode): 3.4 cm RVSP:           29.6 mmHg  LEFT ATRIUM            Index        RIGHT ATRIUM           Index LA diam:      4.50 cm  2.31 cm/m   RA Pressure: 3.00 mmHg LA Vol (A2C): 118.0 ml 60.46 ml/m  RA Area:     19.60 cm LA Vol (A4C): 61.9 ml  31.72 ml/m  RA Volume:   57.30 ml  29.36 ml/m AORTIC VALVE             PULMONIC VALVE LVOT Vmax:   126.00 cm/s PR End Diast Vel: 8.76 msec LVOT Vmean:  70.300 cm/s LVOT VTI:    0.205 m  AORTA Ao Root diam: 3.90 cm Ao Asc diam:  4.20 cm  MITRAL VALVE               TRICUSPID VALVE MV Area (PHT): 4.15 cm    TR Peak grad:   26.6 mmHg MV Decel Time: 183 msec    TR Vmax:        258.00 cm/s MR Peak grad: 103.8 mmHg   Estimated RAP:  3.00 mmHg MR  Mean grad: 67.0 mmHg    RVSP:           29.6 mmHg MR Vmax:      509.50 cm/s MR Vmean:     386.5 cm/s   SHUNTS MV E velocity: 97.50 cm/s  Systemic VTI:  0.20 m MV A velocity: 77.00 cm/s  Systemic Diam: 2.00 cm MV E/A ratio:  1.27  Olga Millers MD Electronically signed by Olga Millers MD Signature Date/Time: 06/30/2022/3:52:06 PM    Final             08/15/2022: B Natriuretic Peptide 399.7 08/20/2022: ALT 105; Hemoglobin 12.2; Platelets 448 08/23/2022: BUN 12; Creatinine, Ser 0.54; Magnesium 2.1; Potassium 3.8; Sodium 137   STS RISK CALCULATOR: Pending  NHYA CLASS: 2     ASSESSMENT AND PLAN:   Severe mitral valve regurgitation - Plan: EKG 12-Lead, CBC, Comprehensive metabolic panel, CANCELED: LONG TERM MONITOR (3-14 DAYS)  Paroxysmal atrial fibrillation with RVR (HCC) - Plan: CBC, Comprehensive metabolic panel, CANCELED: LONG TERM MONITOR (3-14 DAYS)  Primary hypertension  Other hyperlipidemia - Plan: CBC, Comprehensive metabolic panel, Lipid panel, Lipoprotein A (LPA), CANCELED: LONG TERM MONITOR (3-14 DAYS)  Alcohol withdrawal syndrome with complication (HCC)  I viewed the patient's echocardiogram.  It looks like he has developed severe degenerative mitral  regurgitation.  He is also developed atrial fibrillation but I am unsure about whether this is related to the valve or whether this was due to his episode of sepsis.  His left atrium does not seem enlarged.  I had a long talk with the patient about the pathophysiology of degenerative mitral regurgitation.  He really does not have much in way of severe or serious comorbid conditions.  For this reason I will refer him for a transesophageal echocardiogram, coronary angiography and right heart catheterization, and a 2-week monitor.  After all of this data has been reviewed I will refer him to Dr. Leafy Ro for consideration of mitral valve repair and potentially a maze procedure.  I did discuss with him the concept of mitral transcatheter edge-to-edge repair with him as well.  Otherwise I will see the patient back in 6 months.    I have personally reviewed the patients imaging data as summarized above.  I have reviewed the natural history of mitral regurgitation with the patient and family members who are present today. We have discussed the limitations of medical therapy and the poor prognosis associated with symptomatic mitral regurgitation. We have also reviewed potential treatment options, including palliative medical therapy, conventional mitral surgery, and transcatheter mitral edge-to-edge repair. We discussed treatment options in the context of this patient's specific comorbid medical conditions.   All of the patient's questions were answered today. Will make further recommendations based on the results of studies outlined above.   Total time spent with patient today 60 minutes. This includes reviewing records, evaluating the patient and coordinating care.   [name], MD  10/04/2022 10:21 AM    [facility] [address] [place], [place], [place]  [address] Phone: [phone]; Fax: [phone]

## 2022-10-04 NOTE — H&P (View-Only) (Signed)
Patient ID: [name] MRN: [id number] DOB/AGE: 1950-07-02 72 y.o.  Primary Care Physician:Morrow, [name], MD Primary Cardiologist: Alverda Skeans  FOCUSED CARDIOVASCULAR PROBLEM LIST:   1.  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  Paroxysmal atrial fibrillation on Eliquis 3.  Hyperlipidemia 4.  Hypertension    HISTORY OF PRESENT ILLNESS: The patient is a 72 y.o. male with the indicated medical history here for recommendations regarding his mitral regurgitation.  The patient was admitted in June with a worsening shortness of breath and was found to have developed a right-sided pneumonia.  He was treated with ceftriaxone and azithromycin.  He also developed new onset atrial fibrillation requiring Cardizem drip.  An echocardiogram during this admission demonstrated severe mitral regurgitation thought to be due to posterior prolapse.  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  He was discharged to SNF on Eliquis and metoprolol.  Since discharge he has quit using alcohol.  He is still trying to regain his strength.  He tells me he had just noticed palpitations especially when he lays on his left side.  This has been going on for about 2 months prior to being admitted to the hospital.  He is still regaining his strength and is somewhat short of breath at times.  He denies any paroxysmal nocturnal dyspnea, exertional angina, severe bleeding or bruising, signs or symptoms of stroke, or paroxysmal nocturnal dyspnea.  He tells me he is known about his murmur for at least 6 months.  His PCP noted it and he was referred to cardiology.  An echocardiogram in April demonstrated what looks to be severe degenerative mitral regurgitation due to posterior prolapse.  He was scheduled to see [place] in June however he was hospitalized and this was canceled.  He sees a Education officer, community on a regular basis and has no concerns about his  dental health.  Past Medical History:  Diagnosis Date   History of nonmelanoma skin cancer    Hypercholesteremia    Hypertension    Trigeminal neuralgia     Past Surgical History:  Procedure Laterality Date   HERNIA REPAIR  11/07/11   [facility]   INGUINAL HERNIA REPAIR  11/07/2011   Procedure: LAPAROSCOPIC INGUINAL HERNIA;  Surgeon: Atilano Ina, MD,FACS;  Location: [facility];  Service: General;  Laterality: Left;   RETINAL DETACHMENT SURGERY  1997   SKIN CANCER EXCISION  2012, 1999   basal cell - neck and hand    Family History  Problem Relation Age of Onset   Heart disease Father    Heart disease Mother     Social History   Socioeconomic History   Marital status: Married    Spouse name: Not on file   Number of children: Not on file   Years of education: Not on file   Highest education level: Not on file  Occupational History   Not on file  Tobacco Use   Smoking status: Never   Smokeless tobacco: Never  Substance and Sexual Activity   Alcohol use: Yes    Comment: 3 glasses of wine a day   Drug use: No   Sexual activity: Not on file  Other Topics Concern   Not on file  Social History Narrative   Not on file   Social Determinants of Health   Financial Resource Strain: Not on file  Food Insecurity: No Food Insecurity (08/12/2022)   Hunger Vital Sign    Worried About  Running Out of Food in the Last Year: Never true    Ran Out of Food in the Last Year: Never true  Transportation Needs: No Transportation Needs (08/12/2022)   PRAPARE - Administrator, Civil Service (Medical): No    Lack of Transportation (Non-Medical): No  Physical Activity: Not on file  Stress: Not on file  Social Connections: Not on file  Intimate Partner Violence: Not At Risk (08/12/2022)   Humiliation, Afraid, Rape, and Kick questionnaire    Fear of Current or Ex-Partner: No    Emotionally Abused: No    Physically Abused: No    Sexually Abused: No     Prior to Admission medications    Medication Sig Start Date End Date Taking? Authorizing Provider  albuterol (VENTOLIN HFA) 108 (90 Base) MCG/ACT inhaler Inhale 1 puff into the lungs every 6 (six) hours as needed for wheezing or shortness of breath. 08/11/22   [provider]  apixaban (ELIQUIS) 5 MG TABS tablet Take 1 tablet (5 mg total) by mouth 2 (two) times daily. 08/25/22   Ghimire, Werner Lean, MD  folic acid (FOLVITE) 1 MG tablet Take 1 tablet (1 mg total) by mouth daily. 08/25/22   Ghimire, Werner Lean, MD  gabapentin (NEURONTIN) 100 MG capsule Take 2 capsules (200 mg total) by mouth 2 (two) times daily. 08/25/22   Ghimire, Werner Lean, MD  ketoconazole (NIZORAL) 2 % cream Apply 1 Application topically as needed for irritation. 01/16/13   [provider]  melatonin 3 MG TABS tablet Take 3 mg by mouth at bedtime as needed.    [provider]  metoprolol tartrate (LOPRESSOR) 25 MG tablet Take 1 tablet (25 mg total) by mouth 2 (two) times daily. 08/25/22   Ghimire, Werner Lean, MD  Multiple Vitamins-Minerals (ICAPS AREDS 2 PO) Take 1 capsule by mouth in the morning and at bedtime.    [provider]  pravastatin (PRAVACHOL) 10 MG tablet Take 10 mg by mouth at bedtime.    [provider]  sildenafil (REVATIO) 20 MG tablet Take 2-5 tablets for ED orally once a day if needed    [provider]  thiamine (VITAMIN B-1) 100 MG tablet Take 1 tablet (100 mg total) by mouth daily. 08/25/22   Ghimire, Werner Lean, MD  traZODone (DESYREL) 100 MG tablet Take 50-100 mg by mouth at bedtime as needed.     [provider]    No Known Allergies  REVIEW OF SYSTEMS:  General: no fevers/chills/night sweats Eyes: no blurry vision, diplopia, or amaurosis ENT: no sore throat or hearing loss Resp: no cough, wheezing, or hemoptysis CV: no edema or palpitations GI: no abdominal pain, nausea, vomiting, diarrhea, or constipation GU: no dysuria, frequency, or hematuria Skin: no rash Neuro: no  headache, numbness, tingling, or weakness of extremities Musculoskeletal: no joint pain or swelling Heme: no bleeding, DVT, or easy bruising Endo: no polydipsia or polyuria  BP 130/82   Pulse (!) 51   Ht 6\' 2"  (1.88 m)   Wt 154 lb 6.4 oz (70 kg)   SpO2 98%   BMI 19.82 kg/m   PHYSICAL EXAM: GEN:  AO x 3 in no acute distress HEENT: normal Dentition: Normal Neck: JVP normal. +2 carotid upstrokes without bruits. No thyromegaly. Lungs: equal expansion, clear bilaterally CV: Apex is discrete and nondisplaced, RRR with 3/6 holosystolic murmur Abd: soft, non-tender, non-distended; no bruit; positive bowel sounds Ext: no edema, ecchymoses, or cyanosis Vascular: 2+ femoral pulses, 2+ radial pulses  Skin: warm and dry without rash Neuro: CN II-XII grossly intact; motor and sensory grossly intact    DATA AND STUDIES:   EKG Interpretation Date/Time:  Wednesday October 04 2022 09:08:44 EDT Ventricular Rate:  51 PR Interval:  186 QRS Duration:  90 QT Interval:  452 QTC Calculation: 416 R Axis:   23  Text Interpretation: Sinus bradycardia T wave abnormality, consider inferior ischemia When compared with ECG of 17-Aug-2022 22:56, Sinus rhythm has replaced Atrial fibrillation Vent. rate has decreased BY  77 BPM T wave inversion now evident in Inferior leads Confirmed by Alverda Skeans ([id number]) on 10/04/2022 9:11:01 AM    Cardiac Studies & Procedures       ECHOCARDIOGRAM  ECHOCARDIOGRAM COMPLETE 06/30/2022  Narrative ECHOCARDIOGRAM REPORT    Patient Name:   James Ibarra Date of Exam: 06/30/2022 Medical Rec #:  [id number]       Height:       73.0 in Accession #:    [id number]      Weight:       159.0 lb Date of Birth:  Jul 21, 1950       BSA:          1.952 m Patient Age:    71 years        BP:           141/79 mmHg Patient Gender: M               HR:           64 bpm. Exam Location:  [address]  Procedure: 2D Echo, 3D Echo, Cardiac Doppler, Color Doppler and Strain  Analysis  Indications:    R01.1 Murmur  History:        Patient has no prior history of Echocardiogram examinations. Risk Factors:Hypertension and Dyslipidemia.  Sonographer:    Cathie Beams RCS Referring Phys: Farris Has  IMPRESSIONS   1. Severe prolapse of posterior MV leaflet with eccentric MR; likely severe; suggest TEE to further assess. 2. Left ventricular ejection fraction, by estimation, is 60 to 65%. The left ventricle has normal function. The left ventricle has no regional wall motion abnormalities. Left ventricular diastolic function could not be evaluated. The average left ventricular global longitudinal strain is -21.3 %. The global longitudinal strain is normal. 3. Right ventricular systolic function is normal. The right ventricular size is mildly enlarged. 4. Left atrial size was moderately dilated. 5. The mitral valve is normal in structure. Severe mitral valve regurgitation. No evidence of mitral stenosis. There is severe holosystolic prolapse of posterior leaflet of the mitral valve. 6. The aortic valve is tricuspid. Aortic valve regurgitation is not visualized. Aortic valve sclerosis is present, with no evidence of aortic valve stenosis. 7. Aortic dilatation noted. There is borderline dilatation of the aortic root, measuring 39 mm. There is mild dilatation of the ascending aorta, measuring 42 mm. 8. The inferior vena cava is normal in size with greater than 50% respiratory variability, suggesting right atrial pressure of 3 mmHg.  Comparison(s): No prior Echocardiogram.  FINDINGS Left Ventricle: Left ventricular ejection fraction, by estimation, is 60 to 65%. The left ventricle has normal function. The left ventricle has no regional wall motion abnormalities. The average left ventricular global longitudinal strain is -21.3 %. The global longitudinal strain is normal. The left ventricular internal cavity size was normal in size. There is no left ventricular  hypertrophy. Left ventricular diastolic function could not be evaluated.  Right Ventricle: The right ventricular size is mildly enlarged.  Right ventricular systolic function is normal.  Left Atrium: Left atrial size was moderately dilated.  Right Atrium: Right atrial size was normal in size.  Pericardium: There is no evidence of pericardial effusion.  Mitral Valve: The mitral valve is normal in structure. There is severe holosystolic prolapse of posterior leaflet of the mitral valve. Severe mitral valve regurgitation. No evidence of mitral valve stenosis.  Tricuspid Valve: The tricuspid valve is normal in structure. Tricuspid valve regurgitation is mild . No evidence of tricuspid stenosis.  Aortic Valve: The aortic valve is tricuspid. Aortic valve regurgitation is not visualized. Aortic valve sclerosis is present, with no evidence of aortic valve stenosis.  Pulmonic Valve: The pulmonic valve was normal in structure. Pulmonic valve regurgitation is trivial. No evidence of pulmonic stenosis.  Aorta: Aortic dilatation noted. There is borderline dilatation of the aortic root, measuring 39 mm. There is mild dilatation of the ascending aorta, measuring 42 mm.  Venous: The inferior vena cava is normal in size with greater than 50% respiratory variability, suggesting right atrial pressure of 3 mmHg.  IAS/Shunts: No atrial level shunt detected by color flow Doppler.  Additional Comments: Severe prolapse of posterior MV leaflet with eccentric MR; likely severe; suggest TEE to further assess.   LEFT VENTRICLE PLAX 2D LVIDd:         4.80 cm LVIDs:         2.20 cm   2D Longitudinal Strain LV PW:         0.80 cm   2D Strain GLS Avg:     -21.3 % LV IVS:        1.00 cm LVOT diam:     2.00 cm LV SV:         64 LV SV Index:   33        3D Volume EF: LVOT Area:     3.14 cm  3D EF:        70 % LV EDV:       150 ml LV ESV:       46 ml LV SV:        105 ml  RIGHT VENTRICLE RV Basal diam:  4.60  cm TAPSE (M-mode): 3.4 cm RVSP:           29.6 mmHg  LEFT ATRIUM            Index        RIGHT ATRIUM           Index LA diam:      4.50 cm  2.31 cm/m   RA Pressure: 3.00 mmHg LA Vol (A2C): 118.0 ml 60.46 ml/m  RA Area:     19.60 cm LA Vol (A4C): 61.9 ml  31.72 ml/m  RA Volume:   57.30 ml  29.36 ml/m AORTIC VALVE             PULMONIC VALVE LVOT Vmax:   126.00 cm/s PR End Diast Vel: 8.76 msec LVOT Vmean:  70.300 cm/s LVOT VTI:    0.205 m  AORTA Ao Root diam: 3.90 cm Ao Asc diam:  4.20 cm  MITRAL VALVE               TRICUSPID VALVE MV Area (PHT): 4.15 cm    TR Peak grad:   26.6 mmHg MV Decel Time: 183 msec    TR Vmax:        258.00 cm/s MR Peak grad: 103.8 mmHg   Estimated RAP:  3.00 mmHg MR  Mean grad: 67.0 mmHg    RVSP:           29.6 mmHg MR Vmax:      509.50 cm/s MR Vmean:     386.5 cm/s   SHUNTS MV E velocity: 97.50 cm/s  Systemic VTI:  0.20 m MV A velocity: 77.00 cm/s  Systemic Diam: 2.00 cm MV E/A ratio:  1.27  Olga Millers MD Electronically signed by Olga Millers MD Signature Date/Time: 06/30/2022/3:52:06 PM    Final             08/15/2022: B Natriuretic Peptide 399.7 08/20/2022: ALT 105; Hemoglobin 12.2; Platelets 448 08/23/2022: BUN 12; Creatinine, Ser 0.54; Magnesium 2.1; Potassium 3.8; Sodium 137   STS RISK CALCULATOR: Pending  NHYA CLASS: 2     ASSESSMENT AND PLAN:   Severe mitral valve regurgitation - Plan: EKG 12-Lead, CBC, Comprehensive metabolic panel, CANCELED: LONG TERM MONITOR (3-14 DAYS)  Paroxysmal atrial fibrillation with RVR (HCC) - Plan: CBC, Comprehensive metabolic panel, CANCELED: LONG TERM MONITOR (3-14 DAYS)  Primary hypertension  Other hyperlipidemia - Plan: CBC, Comprehensive metabolic panel, Lipid panel, Lipoprotein A (LPA), CANCELED: LONG TERM MONITOR (3-14 DAYS)  Alcohol withdrawal syndrome with complication (HCC)  I viewed the patient's echocardiogram.  It looks like he has developed severe degenerative mitral  regurgitation.  He is also developed atrial fibrillation but I am unsure about whether this is related to the valve or whether this was due to his episode of sepsis.  His left atrium does not seem enlarged.  I had a long talk with the patient about the pathophysiology of degenerative mitral regurgitation.  He really does not have much in way of severe or serious comorbid conditions.  For this reason I will refer him for a transesophageal echocardiogram, coronary angiography and right heart catheterization, and a 2-week monitor.  After all of this data has been reviewed I will refer him to Dr. Leafy Ro for consideration of mitral valve repair and potentially a maze procedure.  I did discuss with him the concept of mitral transcatheter edge-to-edge repair with him as well.  Otherwise I will see the patient back in 6 months.    I have personally reviewed the patients imaging data as summarized above.  I have reviewed the natural history of mitral regurgitation with the patient and family members who are present today. We have discussed the limitations of medical therapy and the poor prognosis associated with symptomatic mitral regurgitation. We have also reviewed potential treatment options, including palliative medical therapy, conventional mitral surgery, and transcatheter mitral edge-to-edge repair. We discussed treatment options in the context of this patient's specific comorbid medical conditions.   All of the patient's questions were answered today. Will make further recommendations based on the results of studies outlined above.   Total time spent with patient today 60 minutes. This includes reviewing records, evaluating the patient and coordinating care.   [name], MD  10/04/2022 10:21 AM    [facility] [address] [place], [place], [place]  [address] Phone: [phone]; Fax: [phone]

## 2022-10-09 ENCOUNTER — Other Ambulatory Visit: Payer: Self-pay | Admitting: Neurology

## 2022-10-10 ENCOUNTER — Ambulatory Visit: Payer: Medicare Other

## 2022-10-10 ENCOUNTER — Telehealth: Payer: Self-pay | Admitting: *Deleted

## 2022-10-10 NOTE — Telephone Encounter (Signed)
Cardiac Catheterization scheduled at [facility] for: Thursday October 12, 2022 10:30 AM Arrival time [facility] Main Entrance A at: 8:30 AM  Nothing to eat after midnight prior to procedure, clear liquids until 5 AM day of procedure  Medication instructions: -Hold:  Eliquis-none 10/10/22 until post procedure -Other usual morning medications can be taken with sips of water including aspirin 81 mg.  Confirmed patient has responsible adult to drive home post procedure and be with patient first 24 hours after arriving home.  Plan to go home the same day, you will only stay overnight if medically necessary.  Reviewed procedure instructions with patient.

## 2022-10-12 ENCOUNTER — Other Ambulatory Visit: Payer: Self-pay

## 2022-10-12 ENCOUNTER — Ambulatory Visit (HOSPITAL_COMMUNITY)
Admission: RE | Admit: 2022-10-12 | Discharge: 2022-10-12 | Disposition: A | Discharge status: Home / Self Care | Payer: Medicare Other | Attending: Internal Medicine | Admitting: Internal Medicine

## 2022-10-12 ENCOUNTER — Encounter (HOSPITAL_COMMUNITY)
Admission: RE | Disposition: A | Discharge status: Home / Self Care | Payer: Self-pay | Source: Home / Self Care | Attending: Internal Medicine

## 2022-10-12 DIAGNOSIS — I480 Paroxysmal atrial fibrillation: Secondary | ICD-10-CM | POA: Diagnosis not present

## 2022-10-12 DIAGNOSIS — Z79899 Other long term (current) drug therapy: Secondary | ICD-10-CM | POA: Insufficient documentation

## 2022-10-12 DIAGNOSIS — I10 Essential (primary) hypertension: Secondary | ICD-10-CM | POA: Insufficient documentation

## 2022-10-12 DIAGNOSIS — E785 Hyperlipidemia, unspecified: Secondary | ICD-10-CM | POA: Diagnosis not present

## 2022-10-12 DIAGNOSIS — Z7901 Long term (current) use of anticoagulants: Secondary | ICD-10-CM | POA: Diagnosis not present

## 2022-10-12 DIAGNOSIS — F10239 Alcohol dependence with withdrawal, unspecified: Secondary | ICD-10-CM | POA: Diagnosis not present

## 2022-10-12 DIAGNOSIS — I340 Nonrheumatic mitral (valve) insufficiency: Secondary | ICD-10-CM | POA: Insufficient documentation

## 2022-10-12 HISTORY — PX: RIGHT/LEFT HEART CATH AND CORONARY ANGIOGRAPHY: CATH118266

## 2022-10-12 LAB — POCT I-STAT EG7
Acid-Base Excess: 0 mmol/L (ref 0.0–2.0)
Acid-Base Excess: 0 mmol/L (ref 0.0–2.0)
Acid-Base Excess: 1 mmol/L (ref 0.0–2.0)
Acid-Base Excess: 1 mmol/L (ref 0.0–2.0)
Bicarbonate: 26.6 mmol/L (ref 20.0–28.0)
Bicarbonate: 26.7 mmol/L (ref 20.0–28.0)
Bicarbonate: 26.9 mmol/L (ref 20.0–28.0)
Bicarbonate: 27 mmol/L (ref 20.0–28.0)
Calcium, Ion: 1.19 mmol/L (ref 1.15–1.40)
Calcium, Ion: 1.2 mmol/L (ref 1.15–1.40)
Calcium, Ion: 1.21 mmol/L (ref 1.15–1.40)
Calcium, Ion: 1.23 mmol/L (ref 1.15–1.40)
HCT: 35 % — ABNORMAL LOW (ref 39.0–52.0)
HCT: 35 % — ABNORMAL LOW (ref 39.0–52.0)
HCT: 36 % — ABNORMAL LOW (ref 39.0–52.0)
HCT: 36 % — ABNORMAL LOW (ref 39.0–52.0)
Hemoglobin: 11.9 g/dL — ABNORMAL LOW (ref 13.0–17.0)
Hemoglobin: 11.9 g/dL — ABNORMAL LOW (ref 13.0–17.0)
Hemoglobin: 12.2 g/dL — ABNORMAL LOW (ref 13.0–17.0)
Hemoglobin: 12.2 g/dL — ABNORMAL LOW (ref 13.0–17.0)
O2 Saturation: 63 %
O2 Saturation: 66 %
O2 Saturation: 67 %
O2 Saturation: 68 %
Potassium: 3.3 mmol/L — ABNORMAL LOW (ref 3.5–5.1)
Potassium: 3.4 mmol/L — ABNORMAL LOW (ref 3.5–5.1)
Potassium: 3.4 mmol/L — ABNORMAL LOW (ref 3.5–5.1)
Potassium: 3.5 mmol/L (ref 3.5–5.1)
Sodium: 141 mmol/L (ref 135–145)
Sodium: 141 mmol/L (ref 135–145)
Sodium: 141 mmol/L (ref 135–145)
Sodium: 142 mmol/L (ref 135–145)
TCO2: 28 mmol/L (ref 22–32)
TCO2: 28 mmol/L (ref 22–32)
TCO2: 28 mmol/L (ref 22–32)
TCO2: 28 mmol/L (ref 22–32)
pCO2, Ven: 47.8 mmHg (ref 44–60)
pCO2, Ven: 48.8 mmHg (ref 44–60)
pCO2, Ven: 48.9 mmHg (ref 44–60)
pCO2, Ven: 50.3 mmHg (ref 44–60)
pH, Ven: 7.335 (ref 7.25–7.43)
pH, Ven: 7.346 (ref 7.25–7.43)
pH, Ven: 7.35 (ref 7.25–7.43)
pH, Ven: 7.355 (ref 7.25–7.43)
pO2, Ven: 35 mmHg (ref 32–45)
pO2, Ven: 37 mmHg (ref 32–45)
pO2, Ven: 37 mmHg (ref 32–45)
pO2, Ven: 38 mmHg (ref 32–45)

## 2022-10-12 LAB — POCT I-STAT 7, (LYTES, BLD GAS, ICA,H+H)
Acid-Base Excess: 0 mmol/L (ref 0.0–2.0)
Bicarbonate: 24.6 mmol/L (ref 20.0–28.0)
Calcium, Ion: 1.21 mmol/L (ref 1.15–1.40)
HCT: 35 % — ABNORMAL LOW (ref 39.0–52.0)
Hemoglobin: 11.9 g/dL — ABNORMAL LOW (ref 13.0–17.0)
O2 Saturation: 96 %
Potassium: 3.5 mmol/L (ref 3.5–5.1)
Sodium: 141 mmol/L (ref 135–145)
TCO2: 26 mmol/L (ref 22–32)
pCO2 arterial: 37.6 mmHg (ref 32–48)
pH, Arterial: 7.424 (ref 7.35–7.45)
pO2, Arterial: 78 mmHg — ABNORMAL LOW (ref 83–108)

## 2022-10-12 SURGERY — RIGHT/LEFT HEART CATH AND CORONARY ANGIOGRAPHY
Anesthesia: LOCAL

## 2022-10-12 MED ORDER — VERAPAMIL HCL 2.5 MG/ML IV SOLN
INTRAVENOUS | Status: AC
  Filled 2022-10-12: qty 2

## 2022-10-12 MED ORDER — HYDRALAZINE HCL 20 MG/ML IJ SOLN: 10.0000 mg | INTRAMUSCULAR | Status: DC | PRN

## 2022-10-12 MED ORDER — HEPARIN SODIUM (PORCINE) 1000 UNIT/ML IJ SOLN
INTRAMUSCULAR | Status: DC | PRN
  Administered 2022-10-12: 5000 [IU] via INTRAVENOUS

## 2022-10-12 MED ORDER — MIDAZOLAM HCL 2 MG/2ML IJ SOLN
INTRAMUSCULAR | Status: DC | PRN
  Administered 2022-10-12: 1 mg via INTRAVENOUS

## 2022-10-12 MED ORDER — SODIUM CHLORIDE 0.9 % WEIGHT BASED INFUSION
3.0000 mL/kg/h | INTRAVENOUS | Status: AC
  Administered 2022-10-12: 3 mL/kg/h via INTRAVENOUS

## 2022-10-12 MED ORDER — IOHEXOL 350 MG/ML SOLN
INTRAVENOUS | Status: DC | PRN
  Administered 2022-10-12: 40 mL

## 2022-10-12 MED ORDER — ONDANSETRON HCL 4 MG/2ML IJ SOLN: 4.0000 mg | Freq: Four times a day (QID) | INTRAMUSCULAR | Status: DC | PRN

## 2022-10-12 MED ORDER — LIDOCAINE HCL (PF) 1 % IJ SOLN
INTRAMUSCULAR | Status: DC | PRN
  Administered 2022-10-12: 2 mL
  Administered 2022-10-12: 5 mL
  Administered 2022-10-12: 2 mL

## 2022-10-12 MED ORDER — ACETAMINOPHEN 325 MG PO TABS: 650.0000 mg | ORAL_TABLET | ORAL | Status: DC | PRN

## 2022-10-12 MED ORDER — LIDOCAINE HCL (PF) 1 % IJ SOLN
INTRAMUSCULAR | Status: AC
  Filled 2022-10-12: qty 30

## 2022-10-12 MED ORDER — FENTANYL CITRATE (PF) 100 MCG/2ML IJ SOLN
INTRAMUSCULAR | Status: DC | PRN
  Administered 2022-10-12: 25 ug via INTRAVENOUS

## 2022-10-12 MED ORDER — SODIUM CHLORIDE 0.9 % IV SOLN: 250.0000 mL | INTRAVENOUS | Status: DC | PRN

## 2022-10-12 MED ORDER — VERAPAMIL HCL 2.5 MG/ML IV SOLN
INTRAVENOUS | Status: DC | PRN
  Administered 2022-10-12: 10 mL via INTRA_ARTERIAL

## 2022-10-12 MED ORDER — MIDAZOLAM HCL 2 MG/2ML IJ SOLN
INTRAMUSCULAR | Status: AC
  Filled 2022-10-12: qty 2

## 2022-10-12 MED ORDER — SODIUM CHLORIDE 0.9 % WEIGHT BASED INFUSION: 1.0000 mL/kg/h | INTRAVENOUS | Status: DC

## 2022-10-12 MED ORDER — LABETALOL HCL 5 MG/ML IV SOLN: 10.0000 mg | INTRAVENOUS | Status: DC | PRN

## 2022-10-12 MED ORDER — SODIUM CHLORIDE 0.9% FLUSH: 3.0000 mL | Freq: Two times a day (BID) | INTRAVENOUS | Status: DC

## 2022-10-12 MED ORDER — ASPIRIN 81 MG PO CHEW: 81.0000 mg | CHEWABLE_TABLET | ORAL | Status: DC

## 2022-10-12 MED ORDER — HEPARIN (PORCINE) IN NACL 1000-0.9 UT/500ML-% IV SOLN
INTRAVENOUS | Status: DC | PRN
  Administered 2022-10-12 (×2): 500 mL

## 2022-10-12 MED ORDER — HEPARIN SODIUM (PORCINE) 1000 UNIT/ML IJ SOLN
INTRAMUSCULAR | Status: AC
  Filled 2022-10-12: qty 10

## 2022-10-12 MED ORDER — FENTANYL CITRATE (PF) 100 MCG/2ML IJ SOLN
INTRAMUSCULAR | Status: AC
  Filled 2022-10-12: qty 2

## 2022-10-12 MED ORDER — SODIUM CHLORIDE 0.9% FLUSH: 3.0000 mL | INTRAVENOUS | Status: DC | PRN

## 2022-10-12 SURGICAL SUPPLY — 15 items
CATH BALLN WEDGE 5F 110CM (CATHETERS) IMPLANT
CATH DIAG 6FR PIGTAIL ANGLED (CATHETERS) IMPLANT
CATH INFINITI AMBI 6FR TG (CATHETERS) IMPLANT
DEVICE RAD COMP TR BAND LRG (VASCULAR PRODUCTS) IMPLANT
GLIDESHEATH SLEND SS 6F .021 (SHEATH) IMPLANT
GUIDEWIRE .025 260CM (WIRE) IMPLANT
PACK CARDIAC CATHETERIZATION (CUSTOM PROCEDURE TRAY) ×1 IMPLANT
PROTECTION STATION PRESSURIZED (MISCELLANEOUS) ×1
SET ATX-X65L (MISCELLANEOUS) IMPLANT
SHEATH GLIDE SLENDER 4/5FR (SHEATH) IMPLANT
SHEATH PINNACLE 5F 10CM (SHEATH) IMPLANT
SHEATH PROBE COVER 6X72 (BAG) IMPLANT
STATION PROTECTION PRESSURIZED (MISCELLANEOUS) IMPLANT
WIRE EMERALD 3MM-J .035X260CM (WIRE) IMPLANT
WIRE MICRO SET SILHO 5FR 7 (SHEATH) IMPLANT

## 2022-10-12 NOTE — Interval H&P Note (Signed)
History and Physical Interval Note:  10/12/2022 9:57 AM  James Ibarra  has presented today for surgery, with the diagnosis of mr.  The various methods of treatment have been discussed with the patient and family. After consideration of risks, benefits and other options for treatment, the patient has consented to  Procedure(s): RIGHT/LEFT HEART CATH AND CORONARY ANGIOGRAPHY (N/A) as a surgical intervention.  The patient's history has been reviewed, patient examined, no change in status, stable for surgery.  I have reviewed the patient's chart and labs.  Questions were answered to the patient's satisfaction.     [name]

## 2022-10-12 NOTE — Progress Notes (Signed)
TR band removed at 1445. Right radial site level 0 clean dry and intact. Gauze dressing applied.

## 2022-10-12 NOTE — Discharge Instructions (Addendum)

## 2022-10-13 ENCOUNTER — Encounter (HOSPITAL_COMMUNITY): Payer: Self-pay | Admitting: Internal Medicine

## 2022-10-13 ENCOUNTER — Encounter: Payer: Medicare Other | Admitting: Rehabilitative and Restorative Service Providers"

## 2022-10-13 NOTE — Pre-Procedure Instructions (Signed)
Spoke to patient on phone regarding procedure Monday.  Instructed to arrive at 0930 am, NPO after midnight.  Confirmed patient has a ride home and responsible person to stay with patient for 24 hours after the procedure  Confirmed no missed doses of Eliquis, instructed patient to take the morning of surgery with a sip of water.  May take am meds with a sip of water.

## 2022-10-16 ENCOUNTER — Ambulatory Visit (HOSPITAL_BASED_OUTPATIENT_CLINIC_OR_DEPARTMENT_OTHER): Payer: Medicare Other

## 2022-10-16 ENCOUNTER — Other Ambulatory Visit: Payer: Self-pay

## 2022-10-16 ENCOUNTER — Encounter (HOSPITAL_COMMUNITY)
Admission: RE | Disposition: A | Discharge status: Home / Self Care | Payer: Self-pay | Source: Home / Self Care | Attending: Internal Medicine

## 2022-10-16 ENCOUNTER — Ambulatory Visit (HOSPITAL_COMMUNITY)
Admission: RE | Admit: 2022-10-16 | Discharge: 2022-10-16 | Disposition: A | Discharge status: Home / Self Care | Payer: Medicare Other | Source: Home / Self Care | Attending: Internal Medicine | Admitting: Internal Medicine

## 2022-10-16 ENCOUNTER — Ambulatory Visit (HOSPITAL_COMMUNITY): Payer: Medicare Other

## 2022-10-16 ENCOUNTER — Ambulatory Visit (HOSPITAL_COMMUNITY)
Admission: RE | Admit: 2022-10-16 | Discharge: 2022-10-16 | Disposition: A | Discharge status: Home / Self Care | Payer: Medicare Other | Source: Ambulatory Visit | Attending: Internal Medicine | Admitting: Internal Medicine

## 2022-10-16 DIAGNOSIS — I340 Nonrheumatic mitral (valve) insufficiency: Secondary | ICD-10-CM

## 2022-10-16 DIAGNOSIS — F10239 Alcohol dependence with withdrawal, unspecified: Secondary | ICD-10-CM | POA: Insufficient documentation

## 2022-10-16 DIAGNOSIS — I480 Paroxysmal atrial fibrillation: Secondary | ICD-10-CM | POA: Diagnosis not present

## 2022-10-16 DIAGNOSIS — E785 Hyperlipidemia, unspecified: Secondary | ICD-10-CM | POA: Insufficient documentation

## 2022-10-16 DIAGNOSIS — Z79899 Other long term (current) drug therapy: Secondary | ICD-10-CM | POA: Insufficient documentation

## 2022-10-16 DIAGNOSIS — Q2112 Patent foramen ovale: Secondary | ICD-10-CM | POA: Insufficient documentation

## 2022-10-16 DIAGNOSIS — Z7901 Long term (current) use of anticoagulants: Secondary | ICD-10-CM | POA: Insufficient documentation

## 2022-10-16 DIAGNOSIS — I700 Atherosclerosis of aorta: Secondary | ICD-10-CM | POA: Diagnosis not present

## 2022-10-16 DIAGNOSIS — I10 Essential (primary) hypertension: Secondary | ICD-10-CM | POA: Insufficient documentation

## 2022-10-16 HISTORY — PX: TEE WITHOUT CARDIOVERSION: SHX5443

## 2022-10-16 LAB — ECHO TEE: Est EF: 60

## 2022-10-16 SURGERY — ECHOCARDIOGRAM, TRANSESOPHAGEAL
Anesthesia: Monitor Anesthesia Care

## 2022-10-16 MED ORDER — PROPOFOL 500 MG/50ML IV EMUL
INTRAVENOUS | Status: DC | PRN
  Administered 2022-10-16: 50 ug/kg/min via INTRAVENOUS

## 2022-10-16 MED ORDER — PROPOFOL 10 MG/ML IV BOLUS
INTRAVENOUS | Status: DC | PRN
Start: 2022-10-16 — End: 2022-10-16
  Administered 2022-10-16 (×7): 20 mg via INTRAVENOUS

## 2022-10-16 MED ORDER — SODIUM CHLORIDE 0.9 % IV SOLN: INTRAVENOUS | Status: DC

## 2022-10-16 MED ORDER — SODIUM CHLORIDE 0.9 % IV SOLN: INTRAVENOUS | Status: DC | PRN

## 2022-10-16 MED ORDER — LIDOCAINE 2% (20 MG/ML) 5 ML SYRINGE
INTRAMUSCULAR | Status: DC | PRN
  Administered 2022-10-16: 100 mg via INTRAVENOUS

## 2022-10-16 NOTE — Transfer of Care (Signed)
Immediate Anesthesia Transfer of Care Note  Patient: James Ibarra  Procedure(s) Performed: TRANSESOPHAGEAL ECHOCARDIOGRAM  Patient Location: Cath Lab  Anesthesia Type:MAC  Level of Consciousness: drowsy  Airway & Oxygen Therapy: Patient Spontanous Breathing and Patient connected to nasal cannula oxygen  Post-op Assessment: Report given to RN and Post -op Vital signs reviewed and stable  Post vital signs: Reviewed and stable  Last Vitals:  Vitals Value Taken Time  BP    Temp    Pulse    Resp    SpO2      Last Pain:  Vitals:   10/16/22 1235  TempSrc: Temporal         Complications: No notable events documented.

## 2022-10-16 NOTE — Interval H&P Note (Signed)
History and Physical Interval Note:  10/16/2022 2:13 PM  James Ibarra  has presented today for surgery, with the diagnosis of MITRAL REGURGITATION.  The various methods of treatment have been discussed with the patient and family. After consideration of risks, benefits and other options for treatment, the patient has consented to  Procedure(s): TRANSESOPHAGEAL ECHOCARDIOGRAM (N/A) as a surgical intervention.  The patient's history has been reviewed, patient examined, no change in status, stable for surgery.  I have reviewed the patient's chart and labs.  Questions were answered to the patient's satisfaction.    Repeat Entry from earlier this morning.   James Ibarra A Ger Ringenberg

## 2022-10-16 NOTE — CV Procedure (Signed)
    TRANSESOPHAGEAL ECHOCARDIOGRAM   NAME:  James Ibarra    MRN: [id number] DOB:  January 02, 1951    ADMIT DATE: 10/16/2022  INDICATIONS: Mitral regurgitation  PROCEDURE:   Informed consent was obtained prior to the procedure. The risks, benefits and alternatives for the procedure were discussed and the patient comprehended these risks.  Risks include, but are not limited to, cough, sore throat, vomiting, nausea, somnolence, esophageal and stomach trauma or perforation, bleeding, low blood pressure, aspiration, pneumonia, infection, trauma to the teeth and death.    Procedural time out performed. The oropharynx was anesthetized with topical 1% benzocaine.    Anesthesia was administered by Dr. Chaney Malling and team.   The patient's heart rate, blood pressure, and oxygen saturation are monitored continuously during the procedure.    The transesophageal probe was inserted in the esophagus and stomach without difficulty and multiple views were obtained.   COMPLICATIONS:    There were no immediate complications.  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 Moderate to severe mitral regurgitation. Full report to follow. Further management per primary team.   [name], MD [place]  [facility]  2:11 PM

## 2022-10-16 NOTE — Anesthesia Preprocedure Evaluation (Signed)
Anesthesia Evaluation  Patient identified by MRN, date of birth, ID band Patient awake    Reviewed: Allergy & Precautions, H&P , NPO status , Patient's Chart, lab work & pertinent test results  Airway Mallampati: II   Neck ROM: full    Dental   Pulmonary neg pulmonary ROS   breath sounds clear to auscultation       Cardiovascular hypertension, + Valvular Problems/Murmurs MR  Rhythm:regular Rate:Normal     Neuro/Psych  Neuromuscular disease    GI/Hepatic   Endo/Other    Renal/GU      Musculoskeletal   Abdominal   Peds  Hematology   Anesthesia Other Findings   Reproductive/Obstetrics                             Anesthesia Physical Anesthesia Plan  ASA: 3  Anesthesia Plan: MAC   Post-op Pain Management:    Induction: Intravenous  PONV Risk Score and Plan: 1 and Propofol infusion and Treatment may vary due to age or medical condition  Airway Management Planned: Nasal Cannula  Additional Equipment:   Intra-op Plan:   Post-operative Plan: Extubation in OR  Informed Consent: I have reviewed the patients History and Physical, chart, labs and discussed the procedure including the risks, benefits and alternatives for the proposed anesthesia with the patient or authorized representative who has indicated his/her understanding and acceptance.     Dental advisory given  Plan Discussed with: CRNA, Anesthesiologist and Surgeon  Anesthesia Plan Comments:        Anesthesia Quick Evaluation

## 2022-10-17 ENCOUNTER — Encounter (HOSPITAL_COMMUNITY): Payer: Self-pay | Admitting: Internal Medicine

## 2022-10-17 ENCOUNTER — Ambulatory Visit: Payer: Medicare Other | Attending: Family Medicine | Admitting: Rehabilitative and Restorative Service Providers"

## 2022-10-17 DIAGNOSIS — R5381 Other malaise: Secondary | ICD-10-CM | POA: Insufficient documentation

## 2022-10-17 DIAGNOSIS — R2689 Other abnormalities of gait and mobility: Secondary | ICD-10-CM | POA: Insufficient documentation

## 2022-10-17 DIAGNOSIS — M6281 Muscle weakness (generalized): Secondary | ICD-10-CM | POA: Insufficient documentation

## 2022-10-17 NOTE — Therapy (Addendum)
OUTPATIENT PHYSICAL THERAPY TREATMENT NOTE   Patient Name: GENNIE WOJNAROWSKI MRN: [id number] DOB:[birth year], 72 y.o., male Today's Date: 10/17/2022  END OF SESSION:  PT End of Session - 10/17/22 1016     Visit Number 3    Date for PT Re-Evaluation 11/17/22    Authorization Type [employer] Medicare    Progress Note Due on Visit 10    PT Start Time 1015    PT Stop Time 1055    PT Time Calculation (min) 40 min    Activity Tolerance Patient tolerated treatment well    Behavior During Therapy WFL for tasks assessed/performed             Past Medical History:  Diagnosis Date   History of nonmelanoma skin cancer    Hypercholesteremia    Hypertension    Trigeminal neuralgia    Past Surgical History:  Procedure Laterality Date   HERNIA REPAIR  11/07/11   [facility]   INGUINAL HERNIA REPAIR  11/07/2011   Procedure: LAPAROSCOPIC INGUINAL HERNIA;  Surgeon: Atilano Ina, MD,FACS;  Location: [facility];  Service: General;  Laterality: Left;   RETINAL DETACHMENT SURGERY  1997   RIGHT/LEFT HEART CATH AND CORONARY ANGIOGRAPHY N/A 10/12/2022   Procedure: RIGHT/LEFT HEART CATH AND CORONARY ANGIOGRAPHY;  Surgeon: [name], MD;  Location: [facility];  Service: Cardiovascular;  Laterality: N/A;   SKIN CANCER EXCISION  2012, 1999   basal cell - neck and hand   TEE WITHOUT CARDIOVERSION N/A 10/16/2022   Procedure: TRANSESOPHAGEAL ECHOCARDIOGRAM;  Surgeon: Christell Constant, MD;  Location: [facility];  Service: Cardiovascular;  Laterality: N/A;   Patient Active Problem List   Diagnosis Date Noted   Protein-calorie malnutrition, severe 08/19/2022   Alcohol withdrawal (HCC) 08/14/2022   Acute metabolic encephalopathy 08/14/2022   Paroxysmal atrial fibrillation with RVR (HCC) 08/14/2022   Nonrheumatic mitral valve regurgitation 08/14/2022   Mitral valve prolapse 08/14/2022   PNA (pneumonia) 08/12/2022   Severe sepsis (HCC) 08/12/2022   HLD (hyperlipidemia) 08/12/2022   Hemifacial  spasm 11/18/2019   Trigeminal neuralgia of left side of face 07/15/2019   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  States that he has been trying to do some of the exercises.  Denies pain today.   PERTINENT HISTORY: Recent Pneumonia, Squamous Cell Carcinoma, HTN  PAIN:  Are you having pain? Yes: NPRS scale: 0/10 Pain location: Hip and lower back Pain description: aching Aggravating factors: sleeping in certain positions for hip pain Relieving factors: getting up and moving around  PRECAUTIONS: None  RED FLAGS: None   WEIGHT BEARING RESTRICTIONS: No  FALLS:  Has patient fallen in last 6 months? No  LIVING ENVIRONMENT: Lives with: lives with their spouse Lives in: House/apartment Stairs: Yes: Internal: 14 steps; can reach both and External: 8 steps; can reach both Has following equipment at home: shower chair and Grab bars  OCCUPATION:  Retired, but worked as a Materials engineer  PLOF: Independent and Leisure: spending time with family, play guitar  PATIENT GOALS: To be steady on my feet and have more endurance and improve muscle tone.  NEXT MD VISIT:  routine yearly physical with Dr [name] in a couple of months.   Pt reports that he may be having a heart surgery later this year to repair his Mitral Valve  OBJECTIVE:    PATIENT SURVEYS:   Eval:  LEFS 52 / 80 = 65.0 %  COGNITION: Overall cognitive status: Within functional limits for tasks assessed     SENSATION: Pt reports some numbness and tingling in his feet   MUSCLE LENGTH: Hamstrings: tightness noted bilat  POSTURE: rounded shoulders and forward head  LOWER EXTREMITY ROM:  WFL  LOWER EXTREMITY MMT:  09/20/2022:   Bilateral hip and hamstring strength of 4/5 Bilateral quad strength of 5/5   FUNCTIONAL TESTS:  Eval: 5 times sit to stand: 16.32 sec with bilat UE (attempted sit to stand without hands, and unable to perform) 3 minute walk test: 494 ft with RPE of 2/10 Single leg Stance:  Right- 2.68 sec, Left- 2.73 sec  10/17/2022: 3 minute walk test:  611 ft with RPE of 2-3/10  GAIT: Distance walked: >500 ft Assistive device utilized: None Level of assistance: Complete Independence Comments: Reports that he does get a little bit more winded than he did prior to his Pneumonia   TODAY'S TREATMENT:      DATE: 10/17/2022  Nustep level 5 x6 min with PT present to discuss status 3 minutes ambulation:  611 ft with RPE of 2-3/10 Sit to/from stand holding 3# dumbbell:  x10 with chest press, x10 with overhead press Standing in parallel bars:  hip abduction and hip extension 2x10 each bilat (cuing for improved technique) Standing rocker board DF/PF x2 min with UE support Seated hamstring stretch 2x20 sec bilat Standing in parallel bars:  marching 2x10   DATE: 7/24 Presession: O2 99% HR 53 Review of initial HEP:  seated HS stretch  sit to stand chair + cushion no hands 2 sets of 5 Standing at the counter hip abduction 10x right/left Standing at the counter hip extension 10x right/left  O2 97% HR 61 2 min rest break Counter push ups 15 5# KB reach downs to mid shin 5x each side 5# KB overhead press 5x right/left Nu-Step seat 65 old model L2 7 min 45 spm average O2 98% HR 54                                                                                                                              DATE: 09/20/2022  Review of HEP (see below) Nustep level 3 x5 min with PT present to discuss status   PATIENT EDUCATION:  Education details: Issued HEP Person educated: Patient Education method: Explanation, Demonstration, and Handouts Education comprehension: verbalized understanding and returned demonstration  HOME EXERCISE PROGRAM: Access Code:  [email or web address] URL: [email or web address] Date: 09/20/2022 Prepared by: Clydie Braun Chenika Nevils  Exercises - Seated Hamstring Stretch  - 1 x daily - 7 x weekly - 2 reps - 20 sec hold - Sit to Stand  - 1 x daily - 7 x weekly - 2 sets - 5 reps - Standing Hip Abduction with Counter Support  - 1 x daily - 7 x weekly - 2 sets - 10 reps - Standing Hip Extension with Counter Support  - 1 x daily - 7 x weekly - 2 sets - 10 reps - Standing March with Counter Support  - 1 x daily - 7 x weekly - 2 sets - 10 reps - Heel Raises with Counter Support  - 1 x daily - 7 x weekly - 2 sets - 10 reps  ASSESSMENT:  CLINICAL IMPRESSION: Mr Moynahan presents to skilled PT reporting that his testing went well and they are anticipating that he may have his cardiac surgery to repair his Mitral Valve at the end of the year.  Patient able to progress with session with occasional seated recovery periods throughout session.  Patient reported some muscle fatigue by end of session, but continues to deny pain.  Patient advised to continue with HEP and daily ambulation, he verbalizes understanding.    OBJECTIVE IMPAIRMENTS: decreased balance, decreased endurance, difficulty walking, decreased strength, increased muscle spasms, impaired flexibility, postural dysfunction, and pain.   ACTIVITY LIMITATIONS: lifting, bending, and stairs  PARTICIPATION LIMITATIONS: cleaning, community activity, and yard work  PERSONAL FACTORS: Age, Past/current experiences, and 1 comorbidity: recent Pneumonia  are also affecting patient's functional  outcome.   REHAB POTENTIAL: Good  CLINICAL DECISION MAKING: Stable/uncomplicated  EVALUATION COMPLEXITY: Low   GOALS: Goals reviewed with patient? Yes  SHORT TERM GOALS: Target date: 10/06/2022 Pt will be independent with initial HEP. Baseline: Goal status: MET  2.  Patient will report ability to walk his dog without getting short of breath. Baseline:  Goal status: MET on 10/17/2022   LONG TERM GOALS: Target date: 11/17/2022  Pt will be independent with advanced HEP. Baseline:  Goal status: INITIAL  2.  Pt will increase 3 minute walk test to greater 700 ft without dyspnea to allow for community ambulation. Baseline: 494 ft Goal status: INITIAL  3.  Pt will increase bilat hip strength to at least 4+/5 to allow him to perform sit to stand without UE use and navigate stairs in home with decreased difficulty. Baseline:  Goal status: INITIAL  4.  Pt will increase single leg stance to at least 15 seconds on each side to decrease risk of falling. Baseline: less than 3 seconds on each side Goal status: INITIAL  5.  Pt will improve 5 times sit to stand to 13 seconds or less without UE use to demonstrate increased functional strength. Baseline: 13.32 sec Goal status: INITIAL    PLAN:  PT FREQUENCY: 2x/week  PT DURATION: 8 weeks  PLANNED INTERVENTIONS: Therapeutic exercises, Therapeutic activity, Neuromuscular re-education, Balance training, Gait training, Patient/Family education, Self Care, Joint mobilization, Joint manipulation, Stair training, Aquatic Therapy, Dry Needling, Electrical stimulation, Spinal manipulation, Spinal mobilization, Cryotherapy, Moist heat, Taping, Traction, Ultrasound, Ionotophoresis 4mg /ml Dexamethasone, Manual therapy, and Re-evaluation  PLAN FOR NEXT SESSION:  progress HEP as indicated, strengthening (has 3 and 5 pound weights at home), gait   Reather Laurence, PT, DPT 10/17/22, 10:57 AM   [facility] [address], [address] [place], [place] [address] Phone # [phone] Fax [phone]

## 2022-10-17 NOTE — Anesthesia Postprocedure Evaluation (Signed)
Anesthesia Post Note  Patient: James Ibarra  Procedure(s) Performed: TRANSESOPHAGEAL ECHOCARDIOGRAM     Patient location during evaluation: Cath Lab Anesthesia Type: MAC Level of consciousness: awake and alert Pain management: pain level controlled Vital Signs Assessment: post-procedure vital signs reviewed and stable Respiratory status: spontaneous breathing, nonlabored ventilation, respiratory function stable and patient connected to nasal cannula oxygen Cardiovascular status: stable and blood pressure returned to baseline Postop Assessment: no apparent nausea or vomiting Anesthetic complications: no   No notable events documented.  Last Vitals:  Vitals:   10/16/22 1430 10/16/22 1434  BP: (!) 148/76 (!) 148/78  Pulse: (!) 45 (!) 46  Resp: 20 20  Temp:    SpO2: 97% 97%    Last Pain:  Vitals:   10/16/22 1419  TempSrc:   PainSc: 0-No pain                 Cyndel Griffey S

## 2022-10-17 NOTE — Telephone Encounter (Signed)
[name], MD  Christell Constant, MD; Henrietta Dine, RN Thanks!  KK, can we get him in to see Renae Fickle for mitral surgical repair?       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  I think surgery is very reasonable.  Thanks, MAC     Confirmed with the patient his visit with Dr. Leafy Ro 11/13/2022.  He was grateful for call and agreed with plan.

## 2022-10-20 ENCOUNTER — Encounter: Payer: Self-pay | Admitting: Rehabilitative and Restorative Service Providers"

## 2022-10-20 ENCOUNTER — Ambulatory Visit: Payer: Medicare Other | Admitting: Cardiology

## 2022-10-20 ENCOUNTER — Ambulatory Visit: Payer: Medicare Other | Admitting: Rehabilitative and Restorative Service Providers"

## 2022-10-20 DIAGNOSIS — I480 Paroxysmal atrial fibrillation: Secondary | ICD-10-CM | POA: Diagnosis not present

## 2022-10-20 DIAGNOSIS — R5381 Other malaise: Secondary | ICD-10-CM

## 2022-10-20 DIAGNOSIS — I340 Nonrheumatic mitral (valve) insufficiency: Secondary | ICD-10-CM | POA: Diagnosis not present

## 2022-10-20 DIAGNOSIS — M6281 Muscle weakness (generalized): Secondary | ICD-10-CM

## 2022-10-20 DIAGNOSIS — R2689 Other abnormalities of gait and mobility: Secondary | ICD-10-CM | POA: Diagnosis not present

## 2022-10-20 NOTE — Therapy (Signed)
OUTPATIENT PHYSICAL THERAPY TREATMENT NOTE   Patient Name: James Ibarra MRN: [id number] DOB:[birth year], 72 y.o., male Today's Date: 10/20/2022  END OF SESSION:  PT End of Session - 10/20/22 0806     Visit Number 4    Date for PT Re-Evaluation 11/17/22    Authorization Type [employer] Medicare    Progress Note Due on Visit 10    PT Start Time 0802    PT Stop Time 0840    PT Time Calculation (min) 38 min    Activity Tolerance Patient tolerated treatment well    Behavior During Therapy [facility] for tasks assessed/performed             Past Medical History:  Diagnosis Date   History of nonmelanoma skin cancer    Hypercholesteremia    Hypertension    Trigeminal neuralgia    Past Surgical History:  Procedure Laterality Date   HERNIA REPAIR  11/07/11   [facility]   INGUINAL HERNIA REPAIR  11/07/2011   Procedure: LAPAROSCOPIC INGUINAL HERNIA;  Surgeon: Atilano Ina, MD,FACS;  Location: [facility];  Service: General;  Laterality: Left;   RETINAL DETACHMENT SURGERY  1997   RIGHT/LEFT HEART CATH AND CORONARY ANGIOGRAPHY N/A 10/12/2022   Procedure: RIGHT/LEFT HEART CATH AND CORONARY ANGIOGRAPHY;  Surgeon: [name], MD;  [facility]: [facility];  Service: Cardiovascular;  Laterality: N/A;   SKIN CANCER EXCISION  2012, 1999   basal cell - neck and hand   TEE WITHOUT CARDIOVERSION N/A 10/16/2022   Procedure: TRANSESOPHAGEAL ECHOCARDIOGRAM;  Surgeon: Christell Constant, MD;  Location: [facility];  Service: Cardiovascular;  Laterality: N/A;   Patient Active Problem List   Diagnosis Date Noted   Protein-calorie malnutrition, severe 08/19/2022   Alcohol withdrawal (HCC) 08/14/2022   Acute metabolic encephalopathy 08/14/2022   Paroxysmal atrial fibrillation with RVR (HCC) 08/14/2022   Nonrheumatic mitral valve regurgitation 08/14/2022   Mitral valve prolapse 08/14/2022   PNA (pneumonia) 08/12/2022   Severe sepsis (HCC) 08/12/2022   HLD (hyperlipidemia) 08/12/2022   Hemifacial  spasm 11/18/2019   Trigeminal neuralgia of left side of face 07/15/2019   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  He is unsure what caused this.   PERTINENT HISTORY: Recent Pneumonia, Squamous Cell Carcinoma, HTN  PAIN:  Are you having pain? Yes: NPRS scale: 3-4/10 Pain location: right hand Pain description: aching Aggravating factors: sleeping in certain positions for hip pain Relieving factors: getting up and moving around  PRECAUTIONS: None  RED FLAGS: None   WEIGHT BEARING RESTRICTIONS: No  FALLS:  Has patient fallen in last 6 months? No  LIVING ENVIRONMENT: Lives with: lives with their spouse Lives in: House/apartment Stairs: Yes: Internal: 14 steps; can reach both and External: 8 steps; can reach both Has following equipment at home: shower chair and Grab bars  OCCUPATION: Retired, but worked as a Materials engineer  PLOF: Independent and Leisure: spending time with family, play guitar  PATIENT GOALS: To be steady on my  feet and have more endurance and improve muscle tone.  NEXT MD VISIT:  routine yearly physical with Dr [name] in a couple of months.   Pt reports that he may be having a heart surgery later this year to repair his Mitral Valve  OBJECTIVE:    PATIENT SURVEYS:  Eval:  LEFS 52 / 80 = 65.0 %  COGNITION: Overall cognitive status: Within functional limits for tasks assessed     SENSATION: Pt reports  some numbness and tingling in his feet   MUSCLE LENGTH: Hamstrings: tightness noted bilat  POSTURE: rounded shoulders and forward head  LOWER EXTREMITY ROM:  WFL  LOWER EXTREMITY MMT:  09/20/2022:   Bilateral hip and hamstring strength of 4/5 Bilateral quad strength of 5/5   FUNCTIONAL TESTS:  Eval: 5 times sit to stand: 16.32 sec with bilat UE (attempted sit to stand without hands, and unable to perform) 3 minute walk test: 494 ft with RPE of 2/10 Single leg Stance:  Right- 2.68 sec, Left- 2.73 sec  10/17/2022: 3 minute walk test:  611 ft with RPE of 2-3/10  GAIT: Distance walked: >500 ft Assistive device utilized: None Level of assistance: Complete Independence Comments: Reports that he does get a little bit more winded than he did prior to his Pneumonia   TODAY'S TREATMENT:      DATE: 10/20/2022  Nustep level 5 x6 min with PT present to discuss status Seated hamstring stretch 2x20 sec bilat Sit to/from stand holding 3# dumbbell:  x10 with chest press, x10 with overhead press Standing in parallel bars:  hip abduction and hip extension 2x10 each bilat (cuing for improved technique) Brief seated recovery period secondary to "my legs feel wobbly" Marching on trampoline x2 min Standing rocker board DF/PF x2 min with UE support FWD step ups onto 6" step with UE x10 bilat   DATE: 10/17/2022  Nustep level 5 x6 min with PT present to discuss status 3 minutes ambulation:  611 ft with RPE of 2-3/10 Sit to/from stand holding 3# dumbbell:  x10 with chest press, x10 with overhead press Standing in parallel bars:  hip abduction and hip extension 2x10 each bilat (cuing for improved technique) Standing rocker board DF/PF x2 min with UE support Seated hamstring stretch 2x20 sec bilat Standing in parallel bars:  marching 2x10   DATE: 7/24 Presession: O2 99% HR 53 Review of initial HEP:  seated HS stretch  sit to stand chair + cushion no hands 2 sets of 5 Standing at the  counter hip abduction 10x right/left Standing at the counter hip extension 10x right/left  O2 97% HR 61 2 min rest break Counter push ups 15 5# KB reach downs to mid shin 5x each side 5# KB overhead press 5x right/left Nu-Step seat 73 old model L2 7 min 45 spm average O2 98% HR 54   PATIENT EDUCATION:  Education details: Issued HEP Person educated: Patient Education method: Explanation, Demonstration, and Handouts Education comprehension: verbalized understanding and returned demonstration  HOME EXERCISE PROGRAM: Access Code: [email or web address] URL: [email or web address] Date: 09/20/2022 Prepared by: Clydie Braun Nancy Arvin  Exercises - Seated Hamstring Stretch  - 1 x daily - 7 x weekly - 2 reps - 20 sec hold - Sit to Stand  - 1 x daily - 7 x weekly - 2 sets - 5 reps - Standing Hip Abduction with Counter Support  - 1 x daily - 7 x weekly - 2 sets - 10 reps - Standing Hip Extension with Counter Support  - 1  x daily - 7 x weekly - 2 sets - 10 reps - Standing March with Counter Support  - 1 x daily - 7 x weekly - 2 sets - 10 reps - Heel Raises with Counter Support  - 1 x daily - 7 x weekly - 2 sets - 10 reps  ASSESSMENT:  CLINICAL IMPRESSION: Mr Chaussee presents to skilled PT reporting some overall aching, but also in his right hand.  Patient requires occasional seated recovery periods throughout secondary to fatigue.  Patient with good participation and able to progress with increasing time for standing marching.  Patient continues with decreased activity tolerance and is hoping to improve his cardiovascular fitness and endurance prior to anticipated cardiac surgery later this year.    OBJECTIVE IMPAIRMENTS: decreased balance, decreased endurance, difficulty walking, decreased strength, increased muscle spasms, impaired flexibility, postural dysfunction, and pain.   ACTIVITY LIMITATIONS: lifting, bending, and stairs  PARTICIPATION LIMITATIONS: cleaning, community activity, and yard  work  PERSONAL FACTORS: Age, Past/current experiences, and 1 comorbidity: recent Pneumonia  are also affecting patient's functional outcome.   REHAB POTENTIAL: Good  CLINICAL DECISION MAKING: Stable/uncomplicated  EVALUATION COMPLEXITY: Low   GOALS: Goals reviewed with patient? Yes  SHORT TERM GOALS: Target date: 10/06/2022 Pt will be independent with initial HEP. Baseline: Goal status: MET  2.  Patient will report ability to walk his dog without getting short of breath. Baseline:  Goal status: MET on 10/17/2022   LONG TERM GOALS: Target date: 11/17/2022  Pt will be independent with advanced HEP. Baseline:  Goal status: INITIAL  2.  Pt will increase 3 minute walk test to greater 700 ft without dyspnea to allow for community ambulation. Baseline: 494 ft Goal status: INITIAL  3.  Pt will increase bilat hip strength to at least 4+/5 to allow him to perform sit to stand without UE use and navigate stairs in home with decreased difficulty. Baseline:  Goal status: INITIAL  4.  Pt will increase single leg stance to at least 15 seconds on each side to decrease risk of falling. Baseline: less than 3 seconds on each side Goal status: INITIAL  5.  Pt will improve 5 times sit to stand to 13 seconds or less without UE use to demonstrate increased functional strength. Baseline: 13.32 sec Goal status: INITIAL    PLAN:  PT FREQUENCY: 2x/week  PT DURATION: 8 weeks  PLANNED INTERVENTIONS: Therapeutic exercises, Therapeutic activity, Neuromuscular re-education, Balance training, Gait training, Patient/Family education, Self Care, Joint mobilization, Joint manipulation, Stair training, Aquatic Therapy, Dry Needling, Electrical stimulation, Spinal manipulation, Spinal mobilization, Cryotherapy, Moist heat, Taping, Traction, Ultrasound, Ionotophoresis 4mg /ml Dexamethasone, Manual therapy, and Re-evaluation  PLAN FOR NEXT SESSION:  progress HEP as indicated, strengthening (has 3 and 5  pound weights at home), gait   Reather Laurence, PT, DPT 10/20/22, 9:02 AM   [facility] [address], [address] [place], [place] [address] Phone # [phone] Fax [phone]

## 2022-10-24 ENCOUNTER — Encounter: Payer: Self-pay | Admitting: Rehabilitative and Restorative Service Providers"

## 2022-10-24 ENCOUNTER — Ambulatory Visit: Payer: Medicare Other | Admitting: Rehabilitative and Restorative Service Providers"

## 2022-10-24 DIAGNOSIS — R2689 Other abnormalities of gait and mobility: Secondary | ICD-10-CM

## 2022-10-24 DIAGNOSIS — R5381 Other malaise: Secondary | ICD-10-CM

## 2022-10-24 DIAGNOSIS — M6281 Muscle weakness (generalized): Secondary | ICD-10-CM | POA: Diagnosis not present

## 2022-10-24 NOTE — Therapy (Signed)
OUTPATIENT PHYSICAL THERAPY TREATMENT NOTE   Patient Name: James Ibarra MRN: [id number] DOB:08/09/1950, 72 y.o., male Today's Date: 10/24/2022  END OF SESSION:  PT End of Session - 10/24/22 1019     Visit Number 5    Date for PT Re-Evaluation 11/17/22    Authorization Type [employer]    Progress Note Due on Visit 10    PT Start Time 1016    PT Stop Time 1055    PT Time Calculation (min) 39 min    Activity Tolerance Patient tolerated treatment well    Behavior During Therapy WFL for tasks assessed/performed             Past Medical History:  Diagnosis Date   History of nonmelanoma skin cancer    Hypercholesteremia    Hypertension    Trigeminal neuralgia    Past Surgical History:  Procedure Laterality Date   HERNIA REPAIR  11/07/11   [facility]   INGUINAL HERNIA REPAIR  11/07/2011   Procedure: LAPAROSCOPIC INGUINAL HERNIA;  Surgeon: Atilano Ina, MD,FACS;  Location: [facility];  Service: General;  Laterality: Left;   RETINAL DETACHMENT SURGERY  1997   RIGHT/LEFT HEART CATH AND CORONARY ANGIOGRAPHY N/A 10/12/2022   Procedure: RIGHT/LEFT HEART CATH AND CORONARY ANGIOGRAPHY;  Surgeon: [name], MD;  Location: [facility];  Service: Cardiovascular;  Laterality: N/A;   SKIN CANCER EXCISION  2012, 1999   basal cell - neck and hand   TEE WITHOUT CARDIOVERSION N/A 10/16/2022   Procedure: TRANSESOPHAGEAL ECHOCARDIOGRAM;  Surgeon: Christell Constant, MD;  Location: [facility];  Service: Cardiovascular;  Laterality: N/A;   Patient Active Problem List   Diagnosis Date Noted   Protein-calorie malnutrition, severe 08/19/2022   Alcohol withdrawal (HCC) 08/14/2022   Acute metabolic encephalopathy 08/14/2022   Paroxysmal atrial fibrillation with RVR (HCC) 08/14/2022   Nonrheumatic mitral valve regurgitation 08/14/2022   Mitral valve prolapse 08/14/2022   PNA (pneumonia) 08/12/2022   Severe sepsis (HCC) 08/12/2022   HLD (hyperlipidemia) 08/12/2022   Hemifacial  spasm 11/18/2019   Trigeminal neuralgia of left side of face 07/15/2019   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  Has built up to 13 minutes.   PERTINENT HISTORY: Recent Pneumonia, Squamous Cell Carcinoma, HTN  PAIN:  Are you having pain? Yes: NPRS scale: 1/10 Pain location: right hand Pain description: aching Aggravating factors: from injections/IVs Relieving factors: getting up and moving around  PRECAUTIONS: None  RED FLAGS: None   WEIGHT BEARING RESTRICTIONS: No  FALLS:  Has patient fallen in last 6 months? No  LIVING ENVIRONMENT: Lives with: lives with their spouse Lives in: House/apartment Stairs: Yes: Internal: 14 steps; can reach both and External: 8 steps; can reach both Has following equipment at home: shower chair and Grab bars  OCCUPATION: Retired, but worked as a Materials engineer  PLOF: Independent and Leisure: spending time with family, play guitar  PATIENT GOALS: To be steady on my feet and  have more endurance and improve muscle tone.  NEXT MD VISIT:  routine yearly physical with Dr [name] in a couple of months.   Pt reports that he may be having a heart surgery later this year to repair his Mitral Valve  OBJECTIVE:    PATIENT SURVEYS:  Eval:  LEFS 52 / 80 = 65.0 %  COGNITION: Overall cognitive status: Within functional limits for tasks assessed     SENSATION: Pt reports some  numbness and tingling in his feet   MUSCLE LENGTH: Hamstrings: tightness noted bilat  POSTURE: rounded shoulders and forward head  LOWER EXTREMITY ROM:  WFL  LOWER EXTREMITY MMT:  09/20/2022:   Bilateral hip and hamstring strength of 4/5 Bilateral quad strength of 5/5   FUNCTIONAL TESTS:  Eval: 5 times sit to stand: 16.32 sec with bilat UE (attempted sit to stand without hands, and unable to perform) 3 minute walk test: 494 ft with RPE of 2/10 Single leg Stance:  Right- 2.68 sec, Left- 2.73 sec  10/17/2022: 3 minute walk test:  611 ft with RPE of 2-3/10  10/24/2022: 5 times sit to stand:  13.42 sec without UE support with RPE of 2-3/10 30 sec chair stand:  9 complete stands with RPE of 5/10 (unable to complete 10th stand before time expired)  GAIT: Distance walked: >500 ft Assistive device utilized: None Level of assistance: Complete Independence Comments: Reports that he does get a little bit more winded than he did prior to his Pneumonia   TODAY'S TREATMENT:      DATE: 10/24/2022  Nustep level 5 x7 min with PT present to discuss status Seated hamstring stretch 2x20 sec bilat 5 times sit to stand and 30 sec chair stand (15 total stands) Marching on trampoline x2 min Standing in parallel bars:  hip abduction and hip extension 2x10 each bilat (cuing for improved technique) Standing rocker board DF/PF x2 min with UE support Tandem gait in parallel bars down and back x3 laps Single leg stance in parallel bars with UE as needed 2x30 sec bilat   DATE: 10/20/2022  Nustep level 5 x6 min with PT present to discuss status Seated hamstring stretch 2x20 sec bilat Sit to/from stand holding 3# dumbbell:  x10 with chest press, x10 with overhead press Standing in parallel bars:  hip abduction and hip extension 2x10 each bilat (cuing for improved technique) Brief seated recovery period secondary to "my legs feel wobbly" Marching on trampoline x2 min Standing rocker board DF/PF  x2 min with UE support FWD step ups onto 6" step with UE x10 bilat   DATE: 10/17/2022  Nustep level 5 x6 min with PT present to discuss status 3 minutes ambulation:  611 ft with RPE of 2-3/10 Sit to/from stand holding 3# dumbbell:  x10 with chest press, x10 with overhead press Standing in parallel bars:  hip abduction and hip extension 2x10 each bilat (cuing for improved technique) Standing rocker board DF/PF x2 min with UE support Seated hamstring stretch 2x20 sec bilat Standing in parallel bars:  marching 2x10     PATIENT EDUCATION:  Education details: Issued HEP Person educated: Patient Education method: Explanation, Facilities manager, and Handouts Education comprehension: verbalized understanding and returned demonstration  HOME EXERCISE PROGRAM: Access Code: [email or web address] URL: [email or web address] Date: 09/20/2022 Prepared by: Clydie Braun Natividad Halls  Exercises - Seated Hamstring Stretch  - 1 x daily - 7 x weekly - 2 reps - 20 sec hold - Sit to Stand  - 1 x daily - 7 x weekly - 2  sets - 5 reps - Standing Hip Abduction with Counter Support  - 1 x daily - 7 x weekly - 2 sets - 10 reps - Standing Hip Extension with Counter Support  - 1 x daily - 7 x weekly - 2 sets - 10 reps - Standing March with Counter Support  - 1 x daily - 7 x weekly - 2 sets - 10 reps - Heel Raises with Counter Support  - 1 x daily - 7 x weekly - 2 sets - 10 reps  ASSESSMENT:  CLINICAL IMPRESSION: Mr Southwell presents to skilled PT reporting that he is trying to be more mindful and perform more exercises at home.  Encouraged pt to walk or bike each day along with his HEP, he verbalized his understanding.  Patient has made improvements in time with 5 times sit to stand and was able to complete a 30 second chair stand today.  Patient continues with some dyspnea during increased exertion, but able to complete with minimal recovery periods.  Patient continues to require skilled PT to progress him towards functional  goals.    OBJECTIVE IMPAIRMENTS: decreased balance, decreased endurance, difficulty walking, decreased strength, increased muscle spasms, impaired flexibility, postural dysfunction, and pain.   ACTIVITY LIMITATIONS: lifting, bending, and stairs  PARTICIPATION LIMITATIONS: cleaning, community activity, and yard work  PERSONAL FACTORS: Age, Past/current experiences, and 1 comorbidity: recent Pneumonia  are also affecting patient's functional outcome.   REHAB POTENTIAL: Good  CLINICAL DECISION MAKING: Stable/uncomplicated  EVALUATION COMPLEXITY: Low   GOALS: Goals reviewed with patient? Yes  SHORT TERM GOALS: Target date: 10/06/2022 Pt will be independent with initial HEP. Baseline: Goal status: MET  2.  Patient will report ability to walk his dog without getting short of breath. Baseline:  Goal status: MET on 10/17/2022   LONG TERM GOALS: Target date: 11/17/2022  Pt will be independent with advanced HEP. Baseline:  Goal status: INITIAL  2.  Pt will increase 3 minute walk test to greater 700 ft without dyspnea to allow for community ambulation. Baseline: 494 ft Goal status: INITIAL  3.  Pt will increase bilat hip strength to at least 4+/5 to allow him to perform sit to stand without UE use and navigate stairs in home with decreased difficulty. Baseline:  Goal status: INITIAL  4.  Pt will increase single leg stance to at least 15 seconds on each side to decrease risk of falling. Baseline: less than 3 seconds on each side Goal status: Ongoing  5.  Pt will improve 5 times sit to stand to 13 seconds or less without UE use to demonstrate increased functional strength. Baseline: 13.32 sec Goal status: Ongoing (see above)    PLAN:  PT FREQUENCY: 2x/week  PT DURATION: 8 weeks  PLANNED INTERVENTIONS: Therapeutic exercises, Therapeutic activity, Neuromuscular re-education, Balance training, Gait training, Patient/Family education, Self Care, Joint mobilization, Joint  manipulation, Stair training, Aquatic Therapy, Dry Needling, Electrical stimulation, Spinal manipulation, Spinal mobilization, Cryotherapy, Moist heat, Taping, Traction, Ultrasound, Ionotophoresis 4mg /ml Dexamethasone, Manual therapy, and Re-evaluation  PLAN FOR NEXT SESSION:  progress HEP as indicated, strengthening (has 3 and 5 pound weights at home), gait   Reather Laurence, PT, DPT 10/24/22, 11:02 AM   [facility] [address], [address] [place], [place] [address] Phone # [phone] Fax [phone]

## 2022-10-26 ENCOUNTER — Ambulatory Visit: Payer: Medicare Other | Admitting: Rehabilitative and Restorative Service Providers"

## 2022-10-26 ENCOUNTER — Encounter: Payer: Self-pay | Admitting: Rehabilitative and Restorative Service Providers"

## 2022-10-26 DIAGNOSIS — R2689 Other abnormalities of gait and mobility: Secondary | ICD-10-CM | POA: Diagnosis not present

## 2022-10-26 DIAGNOSIS — M6281 Muscle weakness (generalized): Secondary | ICD-10-CM | POA: Diagnosis not present

## 2022-10-26 DIAGNOSIS — R5381 Other malaise: Secondary | ICD-10-CM | POA: Diagnosis not present

## 2022-10-26 NOTE — Therapy (Signed)
OUTPATIENT PHYSICAL THERAPY TREATMENT NOTE   Patient Name: James Ibarra MRN: [id number] DOB:02-20-1951, 72 y.o., male Today's Date: 10/26/2022  END OF SESSION:  PT End of Session - 10/26/22 1022     Visit Number 6    Date for PT Re-Evaluation 11/17/22    Authorization Type [employer] Medicare    Progress Note Due on Visit 10    PT Start Time 1018    PT Stop Time 1056    PT Time Calculation (min) 38 min    Activity Tolerance Patient tolerated treatment well    Behavior During Therapy WFL for tasks assessed/performed             Past Medical History:  Diagnosis Date   History of nonmelanoma skin cancer    Hypercholesteremia    Hypertension    Trigeminal neuralgia    Past Surgical History:  Procedure Laterality Date   HERNIA REPAIR  11/07/11   [facility]   INGUINAL HERNIA REPAIR  11/07/2011   Procedure: LAPAROSCOPIC INGUINAL HERNIA;  Surgeon: James Ina, MD,FACS;  Location: [facility];  Service: General;  Laterality: Left;   RETINAL DETACHMENT SURGERY  1997   RIGHT/LEFT HEART CATH AND CORONARY ANGIOGRAPHY N/A 10/12/2022   Procedure: RIGHT/LEFT HEART CATH AND CORONARY ANGIOGRAPHY;  Surgeon: James Pyo, MD;  [facility]: [facility];  Service: Cardiovascular;  Laterality: N/A;   SKIN CANCER EXCISION  2012, 1999   basal cell - neck and hand   TEE WITHOUT CARDIOVERSION N/A 10/16/2022   Procedure: TRANSESOPHAGEAL ECHOCARDIOGRAM;  Surgeon: James Constant, MD;  Location: [facility];  Service: Cardiovascular;  Laterality: N/A;   Patient Active Problem List   Diagnosis Date Noted   Protein-calorie malnutrition, severe 08/19/2022   Alcohol withdrawal (HCC) 08/14/2022   Acute metabolic encephalopathy 08/14/2022   Paroxysmal atrial fibrillation with RVR (HCC) 08/14/2022   Nonrheumatic mitral valve regurgitation 08/14/2022   Mitral valve prolapse 08/14/2022   PNA (pneumonia) 08/12/2022   Severe sepsis (HCC) 08/12/2022   HLD (hyperlipidemia) 08/12/2022   Hemifacial  spasm 11/18/2019   Trigeminal neuralgia of left side of face 07/15/2019   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   PERTINENT HISTORY: Recent Pneumonia, Squamous Cell Carcinoma, HTN  PAIN:  Are you having pain? Yes: NPRS scale: currently 1/10 Pain location: right hand Pain description: aching Aggravating factors: from injections/IVs Relieving factors: getting up and moving around  PRECAUTIONS: None  RED FLAGS: None   WEIGHT BEARING RESTRICTIONS: No  FALLS:  Ibarra patient fallen in last 6 months? No  LIVING ENVIRONMENT: Lives with: lives with their spouse Lives in: House/apartment Stairs: Yes: Internal: 14 steps; can reach both and External: 8 steps; can reach both Ibarra following equipment at home: shower chair and Grab bars  OCCUPATION: Retired, but worked as a Materials engineer  PLOF: Independent and Leisure: spending time with family, play guitar  PATIENT GOALS: To be steady on my feet and have more endurance and improve muscle  tone.  NEXT MD VISIT:  routine yearly physical with Dr James Ibarra in a couple of months.   Pt reports that he may be having a heart surgery later this year to repair his Mitral Valve  OBJECTIVE:    PATIENT SURVEYS:  Eval:  LEFS 52 / 80 = 65.0 %  COGNITION: Overall cognitive status: Within functional limits for tasks assessed     SENSATION: Pt reports some numbness and tingling in his  feet   MUSCLE LENGTH: Hamstrings: tightness noted bilat  POSTURE: rounded shoulders and forward head  LOWER EXTREMITY ROM:  WFL  LOWER EXTREMITY MMT:  09/20/2022:   Bilateral hip and hamstring strength of 4/5 Bilateral quad strength of 5/5   FUNCTIONAL TESTS:  Eval: 5 times sit to stand: 16.32 sec with bilat UE (attempted sit to stand without hands, and unable to perform) 3 minute walk test: 494 ft with RPE of 2/10 Single leg Stance:  Right- 2.68 sec, Left- 2.73 sec  10/17/2022: 3 minute walk test:  611 ft with RPE of 2-3/10  10/24/2022: 5 times sit to stand:  13.42 sec without UE support with RPE of 2-3/10 30 sec chair stand:  9 complete stands with RPE of 5/10 (unable to complete 10th stand before time expired)  GAIT: Distance walked: >500 ft Assistive device utilized: None Level of assistance: Complete Independence Comments: Reports that he does get a little bit more winded than he did prior to his Pneumonia   TODAY'S TREATMENT:      DATE: 10/26/2022  Nustep level 5 x5 min with PT present to discuss status Ambulation around both PT gyms with 2# on bilateral ankles x6 min, pt reports "only the slightest" dyspnea ("I feel like I could keep going for considerably longer") Standing hamstring stretch at stairs 2x20 sec bilat FWD step ups on 6" stair without UE support 2x10 bilat Standing on foam at barre:  hip abduction and hip extension and hamstring curl 2x10 each bilat ding rocker board DF/PF x2 min with UE support   DATE: 10/24/2022  Nustep level 5 x7 min with PT present to discuss status Seated hamstring stretch 2x20 sec bilat 5 times sit to stand and 30 sec chair stand (15 total stands) Marching on trampoline x2 min Standing in parallel bars:  hip abduction and hip extension 2x10 each bilat (cuing for improved technique) Standing rocker board DF/PF x2 min with UE support Tandem gait in parallel bars down and back x3 laps Single leg stance in parallel bars with  UE as needed 2x30 sec bilat   DATE: 10/20/2022  Nustep level 5 x6 min with PT present to discuss status Seated hamstring stretch 2x20 sec bilat Sit to/from stand holding 3# dumbbell:  x10 with chest press, x10 with overhead press Standing in parallel bars:  hip abduction and hip extension 2x10 each bilat (cuing for improved technique) Brief seated recovery period secondary to "my legs feel wobbly" Marching on trampoline x2 min Standing rocker board DF/PF x2 min with UE support FWD step ups onto 6" step with UE x10 bilat     PATIENT EDUCATION:  Education details: Issued HEP Person educated: Patient Education method: Explanation, Demonstration, and Handouts Education comprehension: verbalized understanding and returned demonstration  HOME EXERCISE PROGRAM: Access Code: [email or web address] URL: [email or web address] Date: 09/20/2022 Prepared by: Clydie Braun Geoffery Aultman  Exercises - Seated Hamstring Stretch  - 1 x daily - 7 x weekly - 2 reps - 20 sec hold - Sit to Stand  - 1 x daily - 7 x  weekly - 2 sets - 5 reps - Standing Hip Abduction with Counter Support  - 1 x daily - 7 x weekly - 2 sets - 10 reps - Standing Hip Extension with Counter Support  - 1 x daily - 7 x weekly - 2 sets - 10 reps - Standing March with Counter Support  - 1 x daily - 7 x weekly - 2 sets - 10 reps - Heel Raises with Counter Support  - 1 x daily - 7 x weekly - 2 sets - 10 reps  ASSESSMENT:  CLINICAL IMPRESSION: Mr Vogan presents to skilled PT reporting that he Ibarra been able to walk his dog for longer.  Patient able to tolerate 5 min of ambulation with 2# on bilateral ankles and perform step up exercise without UE use.  Pt is making excellent progress with goals and is on track for discharge, as anticipated.    OBJECTIVE IMPAIRMENTS: decreased balance, decreased endurance, difficulty walking, decreased strength, increased muscle spasms, impaired flexibility, postural dysfunction, and pain.   ACTIVITY  LIMITATIONS: lifting, bending, and stairs  PARTICIPATION LIMITATIONS: cleaning, community activity, and yard work  PERSONAL FACTORS: Age, Past/current experiences, and 1 comorbidity: recent Pneumonia  are also affecting patient's functional outcome.   REHAB POTENTIAL: Good  CLINICAL DECISION MAKING: Stable/uncomplicated  EVALUATION COMPLEXITY: Low   GOALS: Goals reviewed with patient? Yes  SHORT TERM GOALS: Target date: 10/06/2022 Pt will be independent with initial HEP. Baseline: Goal status: MET  2.  Patient will report ability to walk his dog without getting short of breath. Baseline:  Goal status: MET on 10/17/2022   LONG TERM GOALS: Target date: 11/17/2022  Pt will be independent with advanced HEP. Baseline:  Goal status: Ongoing  2.  Pt will increase 3 minute walk test to greater 700 ft without dyspnea to allow for community ambulation. Baseline: 494 ft Goal status: Ongoing (see above)  3.  Pt will increase bilat hip strength to at least 4+/5 to allow him to perform sit to stand without UE use and navigate stairs in home with decreased difficulty. Baseline:  Goal status: Ongoing  4.  Pt will increase single leg stance to at least 15 seconds on each side to decrease risk of falling. Baseline: less than 3 seconds on each side Goal status: Ongoing  5.  Pt will improve 5 times sit to stand to 13 seconds or less without UE use to demonstrate increased functional strength. Baseline: 13.32 sec Goal status: Ongoing (see above)    PLAN:  PT FREQUENCY: 2x/week  PT DURATION: 8 weeks  PLANNED INTERVENTIONS: Therapeutic exercises, Therapeutic activity, Neuromuscular re-education, Balance training, Gait training, Patient/Family education, Self Care, Joint mobilization, Joint manipulation, Stair training, Aquatic Therapy, Dry Needling, Electrical stimulation, Spinal manipulation, Spinal mobilization, Cryotherapy, Moist heat, Taping, Traction, Ultrasound, Ionotophoresis  4mg /ml Dexamethasone, Manual therapy, and Re-evaluation  PLAN FOR NEXT SESSION:  progress HEP as indicated, strengthening (Ibarra 3 and 5 pound weights at home), gait   Reather Laurence, PT, DPT 10/26/22, 11:42 AM   [facility] [address], [address] [place], [place] [address] Phone # [phone] Fax [phone]

## 2022-10-31 ENCOUNTER — Encounter: Payer: Self-pay | Admitting: Rehabilitative and Restorative Service Providers"

## 2022-10-31 ENCOUNTER — Ambulatory Visit: Payer: Medicare Other | Admitting: Rehabilitative and Restorative Service Providers"

## 2022-10-31 DIAGNOSIS — R2689 Other abnormalities of gait and mobility: Secondary | ICD-10-CM | POA: Diagnosis not present

## 2022-10-31 DIAGNOSIS — R5381 Other malaise: Secondary | ICD-10-CM | POA: Diagnosis not present

## 2022-10-31 DIAGNOSIS — M6281 Muscle weakness (generalized): Secondary | ICD-10-CM | POA: Diagnosis not present

## 2022-10-31 NOTE — Therapy (Signed)
OUTPATIENT PHYSICAL THERAPY TREATMENT NOTE   Patient Name: James Ibarra MRN: [id number] DOB:October 13, 1950, 72 y.o., male Today's Date: 10/31/2022  END OF SESSION:  PT End of Session - 10/31/22 0852     Visit Number 7    Date for PT Re-Evaluation 11/17/22    Authorization Type [employer] Medicare    Progress Note Due on Visit 10    PT Start Time 0845    PT Stop Time 0925    PT Time Calculation (min) 40 min    Activity Tolerance Patient tolerated treatment well    Behavior During Therapy [facility] for tasks assessed/performed             Past Medical History:  Diagnosis Date   History of nonmelanoma skin cancer    Hypercholesteremia    Hypertension    Trigeminal neuralgia    Past Surgical History:  Procedure Laterality Date   HERNIA REPAIR  11/07/11   [facility]   INGUINAL HERNIA REPAIR  11/07/2011   Procedure: LAPAROSCOPIC INGUINAL HERNIA;  Surgeon: Atilano Ina, MD,FACS;  Location: [facility];  Service: General;  Laterality: Left;   RETINAL DETACHMENT SURGERY  1997   RIGHT/LEFT HEART CATH AND CORONARY ANGIOGRAPHY N/A 10/12/2022   Procedure: RIGHT/LEFT HEART CATH AND CORONARY ANGIOGRAPHY;  Surgeon: [name], MD;  Location: [facility];  Service: Cardiovascular;  Laterality: N/A;   SKIN CANCER EXCISION  2012, 1999   basal cell - neck and hand   TEE WITHOUT CARDIOVERSION N/A 10/16/2022   Procedure: TRANSESOPHAGEAL ECHOCARDIOGRAM;  Surgeon: Christell Constant, MD;  Location: [facility];  Service: Cardiovascular;  Laterality: N/A;   Patient Active Problem List   Diagnosis Date Noted   Protein-calorie malnutrition, severe 08/19/2022   Alcohol withdrawal (HCC) 08/14/2022   Acute metabolic encephalopathy 08/14/2022   Paroxysmal atrial fibrillation with RVR (HCC) 08/14/2022   Nonrheumatic mitral valve regurgitation 08/14/2022   Mitral valve prolapse 08/14/2022   PNA (pneumonia) 08/12/2022   Severe sepsis (HCC) 08/12/2022   HLD (hyperlipidemia) 08/12/2022   Hemifacial  spasm 11/18/2019   Trigeminal neuralgia of left side of face 07/15/2019   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  Denies current pain.   PERTINENT HISTORY: Recent Pneumonia, Squamous Cell Carcinoma, HTN  PAIN:  Are you having pain? Yes: NPRS scale: currently 0/10 Pain location: right hand Pain description: aching Aggravating factors: from injections/IVs Relieving factors: getting up and moving around  PRECAUTIONS: None  RED FLAGS: None   WEIGHT BEARING RESTRICTIONS: No  FALLS:  Has patient fallen in last 6 months? No  LIVING ENVIRONMENT: Lives with: lives with their spouse Lives in: House/apartment Stairs: Yes: Internal: 14 steps; can reach both and External: 8 steps; can reach both Has following equipment at home: shower chair and Grab bars  OCCUPATION: Retired, but worked as a Materials engineer  PLOF: Independent and Leisure: spending time with family, play guitar  PATIENT GOALS: To be steady on my feet and have more endurance  and improve muscle tone.  NEXT MD VISIT:  routine yearly physical with Dr [name] in a couple of months.   Pt reports that he may be having a heart surgery later this year to repair his Mitral Valve  OBJECTIVE:    PATIENT SURVEYS:  Eval:  LEFS 52 / 80 = 65.0 %  COGNITION: Overall cognitive status: Within functional limits for tasks assessed     SENSATION: Pt reports some numbness  and tingling in his feet   MUSCLE LENGTH: Hamstrings: tightness noted bilat  POSTURE: rounded shoulders and forward head  LOWER EXTREMITY ROM:  WFL  LOWER EXTREMITY MMT:  09/20/2022:   Bilateral hip and hamstring strength of 4/5 Bilateral quad strength of 5/5   FUNCTIONAL TESTS:  Eval: 5 times sit to stand: 16.32 sec with bilat UE (attempted sit to stand without hands, and unable to perform) 3 minute walk test: 494 ft with RPE of 2/10 Single leg Stance:  Right- 2.68 sec, Left- 2.73 sec  10/17/2022: 3 minute walk test:  611 ft with RPE of 2-3/10  10/24/2022: 5 times sit to stand:  13.42 sec without UE support with RPE of 2-3/10 30 sec chair stand:  9 complete stands with RPE of 5/10 (unable to complete 10th stand before time expired)  GAIT: Distance walked: >500 ft Assistive device utilized: None Level of assistance: Complete Independence Comments: Reports that he does get a little bit more winded than he did prior to his Pneumonia   TODAY'S TREATMENT:      DATE: 10/31/2022  Nustep level 5 x7 min with PT present to discuss status Standing hamstring stretch at stairs 2x20 sec bilat FWD step ups on 6" stair without UE support 2x10 bilat Lateral side step ups on 6" sep with occasional UE support 2x10 bilat Seated on chair with foam pad for cushion/elevated surface holding 5# kettlebell sit to stand:  x10 with chest press, x10 with overhead press [employer] holding 15# kettlebell x100 ft in each arm Standing at counter:  hip extension, hip abduction, HS curl.  2x10 each bilat   DATE: 10/26/2022  Nustep level 5 x5 min with PT present to discuss status Ambulation around both PT gyms with 2# on bilateral ankles x6 min, pt reports "only the slightest" dyspnea ("I feel like I could keep going for considerably longer") Standing hamstring stretch at stairs 2x20 sec bilat FWD step ups on 6" stair without UE support 2x10 bilat Standing on foam at barre:  hip abduction and hip  extension and hamstring curl 2x10 each bilat ding rocker board DF/PF x2 min with UE support   DATE: 10/24/2022  Nustep level 5 x7 min with PT present to discuss status Seated hamstring stretch 2x20 sec bilat 5 times sit to stand and 30 sec chair stand (15 total stands) Marching on trampoline x2 min Standing in parallel bars:  hip abduction and hip extension 2x10 each bilat (cuing for improved technique) Standing rocker board DF/PF x2 min with UE support Tandem gait in parallel bars down and back x3 laps Single leg stance in parallel bars with UE as needed 2x30 sec bilat     PATIENT EDUCATION:  Education details: Issued HEP Person educated: Patient Education method: Programmer, multimedia, Facilities manager, and Handouts Education comprehension: verbalized understanding and returned demonstration  HOME EXERCISE PROGRAM: Access Code: [email or web address] URL: [email or web address] Date: 09/20/2022 Prepared by: Clydie Braun Dustyn Dansereau  Exercises - Seated Hamstring Stretch  - 1 x daily - 7 x weekly - 2 reps - 20 sec hold -  Sit to Stand  - 1 x daily - 7 x weekly - 2 sets - 5 reps - Standing Hip Abduction with Counter Support  - 1 x daily - 7 x weekly - 2 sets - 10 reps - Standing Hip Extension with Counter Support  - 1 x daily - 7 x weekly - 2 sets - 10 reps - Standing March with Counter Support  - 1 x daily - 7 x weekly - 2 sets - 10 reps - Heel Raises with Counter Support  - 1 x daily - 7 x weekly - 2 sets - 10 reps  ASSESSMENT:  CLINICAL IMPRESSION: Mr Brust presents to skilled PT reporting that he feels that he is getting stronger, states that sit to stands from lower surfaces can still sometimes be difficult.  Patient able to progress with standing exercises to progress his activity tolerance.  He does require one seated recovery period in the middle secondary to fatigue and dyspnea, but took less time than required yesterday.  Patient continues to require skilled PT to progress towards increased  strength and prepare for upcoming cardiac surgery.    OBJECTIVE IMPAIRMENTS: decreased balance, decreased endurance, difficulty walking, decreased strength, increased muscle spasms, impaired flexibility, postural dysfunction, and pain.   ACTIVITY LIMITATIONS: lifting, bending, and stairs  PARTICIPATION LIMITATIONS: cleaning, community activity, and yard work  PERSONAL FACTORS: Age, Past/current experiences, and 1 comorbidity: recent Pneumonia  are also affecting patient's functional outcome.   REHAB POTENTIAL: Good  CLINICAL DECISION MAKING: Stable/uncomplicated  EVALUATION COMPLEXITY: Low   GOALS: Goals reviewed with patient? Yes  SHORT TERM GOALS: Target date: 10/06/2022 Pt will be independent with initial HEP. Baseline: Goal status: MET  2.  Patient will report ability to walk his dog without getting short of breath. Baseline:  Goal status: MET on 10/17/2022   LONG TERM GOALS: Target date: 11/17/2022  Pt will be independent with advanced HEP. Baseline:  Goal status: Ongoing  2.  Pt will increase 3 minute walk test to greater 700 ft without dyspnea to allow for community ambulation. Baseline: 494 ft Goal status: Ongoing (see above)  3.  Pt will increase bilat hip strength to at least 4+/5 to allow him to perform sit to stand without UE use and navigate stairs in home with decreased difficulty. Baseline:  Goal status: Ongoing  4.  Pt will increase single leg stance to at least 15 seconds on each side to decrease risk of falling. Baseline: less than 3 seconds on each side Goal status: Ongoing  5.  Pt will improve 5 times sit to stand to 13 seconds or less without UE use to demonstrate increased functional strength. Baseline: 13.32 sec Goal status: Ongoing (see above)    PLAN:  PT FREQUENCY: 2x/week  PT DURATION: 8 weeks  PLANNED INTERVENTIONS: Therapeutic exercises, Therapeutic activity, Neuromuscular re-education, Balance training, Gait training,  Patient/Family education, Self Care, Joint mobilization, Joint manipulation, Stair training, Aquatic Therapy, Dry Needling, Electrical stimulation, Spinal manipulation, Spinal mobilization, Cryotherapy, Moist heat, Taping, Traction, Ultrasound, Ionotophoresis 4mg /ml Dexamethasone, Manual therapy, and Re-evaluation  PLAN FOR NEXT SESSION:  progress HEP as indicated, strengthening (has 3 and 5 pound weights at home), gait   Reather Laurence, PT, DPT 10/31/22, 9:52 AM   [facility] [address], [address] [place], [place] [address] Phone # [phone] Fax [phone]

## 2022-11-02 ENCOUNTER — Ambulatory Visit: Payer: Medicare Other | Admitting: Rehabilitative and Restorative Service Providers"

## 2022-11-02 ENCOUNTER — Encounter: Payer: Self-pay | Admitting: Rehabilitative and Restorative Service Providers"

## 2022-11-02 DIAGNOSIS — R2689 Other abnormalities of gait and mobility: Secondary | ICD-10-CM

## 2022-11-02 DIAGNOSIS — R5381 Other malaise: Secondary | ICD-10-CM

## 2022-11-02 DIAGNOSIS — M6281 Muscle weakness (generalized): Secondary | ICD-10-CM

## 2022-11-02 NOTE — Therapy (Signed)
OUTPATIENT PHYSICAL THERAPY TREATMENT NOTE   Patient Name: James Ibarra MRN: [id number] DOB:1950-06-18, 72 y.o., male Today's Date: 11/02/2022  END OF SESSION:  PT End of Session - 11/02/22 1106     Visit Number 8    Date for PT Re-Evaluation 11/17/22    Authorization Type [employer] Medicare    Progress Note Due on Visit 10    PT Start Time 1011    PT Stop Time 1051    PT Time Calculation (min) 40 min    Activity Tolerance Patient tolerated treatment well    Behavior During Therapy WFL for tasks assessed/performed             Past Medical History:  Diagnosis Date   History of nonmelanoma skin cancer    Hypercholesteremia    Hypertension    Trigeminal neuralgia    Past Surgical History:  Procedure Laterality Date   HERNIA REPAIR  11/07/11   [facility]   INGUINAL HERNIA REPAIR  11/07/2011   Procedure: LAPAROSCOPIC INGUINAL HERNIA;  Surgeon: Atilano Ina, MD,FACS;  Location: [facility];  Service: General;  Laterality: Left;   RETINAL DETACHMENT SURGERY  1997   RIGHT/LEFT HEART CATH AND CORONARY ANGIOGRAPHY N/A 10/12/2022   Procedure: RIGHT/LEFT HEART CATH AND CORONARY ANGIOGRAPHY;  Surgeon: [name], MD;  Location: [facility];  Service: Cardiovascular;  Laterality: N/A;   SKIN CANCER EXCISION  2012, 1999   basal cell - neck and hand   TEE WITHOUT CARDIOVERSION N/A 10/16/2022   Procedure: TRANSESOPHAGEAL ECHOCARDIOGRAM;  Surgeon: Christell Constant, MD;  Location: [facility];  Service: Cardiovascular;  Laterality: N/A;   Patient Active Problem List   Diagnosis Date Noted   Protein-calorie malnutrition, severe 08/19/2022   Alcohol withdrawal (HCC) 08/14/2022   Acute metabolic encephalopathy 08/14/2022   Paroxysmal atrial fibrillation with RVR (HCC) 08/14/2022   Nonrheumatic mitral valve regurgitation 08/14/2022   Mitral valve prolapse 08/14/2022   PNA (pneumonia) 08/12/2022   Severe sepsis (HCC) 08/12/2022   HLD (hyperlipidemia) 08/12/2022   Hemifacial  spasm 11/18/2019   Trigeminal neuralgia of left side of face 07/15/2019   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  Denies current pain.   PERTINENT HISTORY: Recent Pneumonia, Squamous Cell Carcinoma, HTN  PAIN:  Are you having pain? Yes: NPRS scale: currently 0/10 Pain location: right hand Pain description: aching Aggravating factors: from injections/IVs Relieving factors: getting up and moving around  PRECAUTIONS: None  RED FLAGS: None   WEIGHT BEARING RESTRICTIONS: No  FALLS:  Has patient fallen in last 6 months? No  LIVING ENVIRONMENT: Lives with: lives with their spouse Lives in: House/apartment Stairs: Yes: Internal: 14 steps; can reach both and External: 8 steps; can reach both Has following equipment at home: shower chair and Grab bars  OCCUPATION: Retired, but worked as a Materials engineer  PLOF: Independent and Leisure: spending time with family, play guitar  PATIENT GOALS: To be steady on my feet and have more endurance and improve muscle tone.  NEXT  MD VISIT:  routine yearly physical with Dr [name] in a couple of months.   Pt reports that he may be having a heart surgery later this year to repair his Mitral Valve  OBJECTIVE:    PATIENT SURVEYS:  Eval:  LEFS 52 / 80 = 65.0 %  COGNITION: Overall cognitive status: Within functional limits for tasks assessed     SENSATION: Pt reports some numbness and tingling in his  feet   MUSCLE LENGTH: Hamstrings: tightness noted bilat  POSTURE: rounded shoulders and forward head  LOWER EXTREMITY ROM:  WFL  LOWER EXTREMITY MMT:  09/20/2022:   Bilateral hip and hamstring strength of 4/5 Bilateral quad strength of 5/5   FUNCTIONAL TESTS:  Eval: 5 times sit to stand: 16.32 sec with bilat UE (attempted sit to stand without hands, and unable to perform) 3 minute walk test: 494 ft with RPE of 2/10 Single leg Stance:  Right- 2.68 sec, Left- 2.73 sec  10/17/2022: 3 minute walk test:  611 ft with RPE of 2-3/10  10/24/2022: 5 times sit to stand:  13.42 sec without UE support with RPE of 2-3/10 30 sec chair stand:  9 complete stands with RPE of 5/10 (unable to complete 10th stand before time expired)  11/02/2022: 3 minute walk test:  582 ft with RPE of 2/10  GAIT: Distance walked: >500 ft Assistive device utilized: None Level of assistance: Complete Independence Comments: Reports that he does get a little bit more winded than he did prior to his Pneumonia   TODAY'S TREATMENT:      DATE: 11/02/2022  Nustep level 5 x6 min with PT present to discuss status Seated hamstring stretch 2x20 sec bilat 3 minutes ambulation in PT clinic for 582 ft with RPE of 2/10 Seated on chair with foam pad for cushion/elevated surface holding 5# kettlebell sit to stand:  x10 with chest press, x10 with overhead press [employer] holding 15# kettlebell 2x100 ft in each arm Standing at counter standing on foam pad:  hip extension, hip abduction, HS curl.  2x10 each bilat FWD step up onto metal stool x10 bilat (with UE support) Seated right lateral side-bend over peanut ball 2x10   DATE: 10/31/2022  Nustep level 5 x7 min with PT present to discuss status Standing hamstring stretch at stairs 2x20 sec bilat FWD step ups on 6" stair without UE support 2x10 bilat Lateral side step ups on 6" sep with occasional UE support 2x10 bilat Seated on chair with foam pad for  cushion/elevated surface holding 5# kettlebell sit to stand:  x10 with chest press, x10 with overhead press [employer] holding 15# kettlebell x100 ft in each arm Standing at counter:  hip extension, hip abduction, HS curl.  2x10 each bilat   DATE: 10/26/2022  Nustep level 5 x5 min with PT present to discuss status Ambulation around both PT gyms with 2# on bilateral ankles x6 min, pt reports "only the slightest" dyspnea ("I feel like I could keep going for considerably longer") Standing hamstring stretch at stairs 2x20 sec bilat FWD step ups on 6" stair without UE support 2x10 bilat Standing on foam at barre:  hip abduction and hip extension and hamstring curl 2x10 each bilat ding rocker board DF/PF x2 min with UE support    PATIENT EDUCATION:  Education details: Issued HEP Person educated: Patient Education method: Explanation, Demonstration, and Handouts Education comprehension: verbalized understanding and returned demonstration  HOME EXERCISE PROGRAM: Access Code: [email or web address] URL: [email or web address] Date: 09/20/2022 Prepared by: Reather Laurence  Exercises - Seated Hamstring Stretch  - 1 x daily - 7 x weekly - 2 reps - 20 sec hold - Sit to Stand  - 1 x daily - 7 x weekly - 2 sets - 5 reps - Standing Hip Abduction with Counter Support  - 1 x daily - 7 x weekly - 2 sets - 10 reps - Standing Hip Extension with Counter Support  - 1 x daily - 7 x weekly - 2 sets - 10 reps - Standing March with Counter Support  - 1 x daily - 7 x weekly - 2 sets - 10 reps - Heel Raises with Counter Support  - 1 x daily - 7 x weekly - 2 sets - 10 reps  ASSESSMENT:  CLINICAL IMPRESSION: James Ibarra presents to skilled PT reporting that he is going to try to be more compliant with exercises to get him in better physical condition before his anticipated cardiac surgery.  Patient with similar distance on 3 minute walk test, but pt admits that he was not pushing himself to walk fast.  Patient  able to perform step up on taller step stool vs 6 inch step.  Patient is on track for discharge as anticipated.    OBJECTIVE IMPAIRMENTS: decreased balance, decreased endurance, difficulty walking, decreased strength, increased muscle spasms, impaired flexibility, postural dysfunction, and pain.   ACTIVITY LIMITATIONS: lifting, bending, and stairs  PARTICIPATION LIMITATIONS: cleaning, community activity, and yard work  PERSONAL FACTORS: Age, Past/current experiences, and 1 comorbidity: recent Pneumonia  are also affecting patient's functional outcome.   REHAB POTENTIAL: Good  CLINICAL DECISION MAKING: Stable/uncomplicated  EVALUATION COMPLEXITY: Low   GOALS: Goals reviewed with patient? Yes  SHORT TERM GOALS: Target date: 10/06/2022 Pt will be independent with initial HEP. Baseline: Goal status: MET  2.  Patient will report ability to walk his dog without getting short of breath. Baseline:  Goal status: MET on 10/17/2022   LONG TERM GOALS: Target date: 11/17/2022  Pt will be independent with advanced HEP. Baseline:  Goal status: Ongoing  2.  Pt will increase 3 minute walk test to greater 700 ft without dyspnea to allow for community ambulation. Baseline: 494 ft Goal status: Ongoing (see above)  3.  Pt will increase bilat hip strength to at least 4+/5 to allow him to perform sit to stand without UE use and navigate stairs in home with decreased difficulty. Baseline:  Goal status: Ongoing  4.  Pt will increase single leg stance to at least 15 seconds on each side to decrease risk of falling. Baseline: less than 3 seconds on each side Goal status: Ongoing  5.  Pt will improve 5 times sit to stand to 13 seconds or less without UE use to demonstrate increased functional strength. Baseline: 13.32 sec Goal status: Ongoing (see above)    PLAN:  PT FREQUENCY: 2x/week  PT DURATION: 8 weeks  PLANNED INTERVENTIONS: Therapeutic exercises, Therapeutic activity,  Neuromuscular re-education, Balance training, Gait training, Patient/Family education, Self Care, Joint mobilization, Joint manipulation, Stair training, Aquatic Therapy, Dry Needling, Electrical stimulation, Spinal manipulation, Spinal mobilization, Cryotherapy, Moist heat, Taping, Traction, Ultrasound, Ionotophoresis 4mg /ml Dexamethasone, Manual therapy, and Re-evaluation  PLAN FOR NEXT SESSION:  progress HEP as indicated, strengthening (has 3 and 5 pound weights at home), gait   Reather Laurence, PT, DPT 11/02/22, 11:16 AM   [facility] [address], [address] [place], [place] [address] Phone # [phone] Fax [phone]

## 2022-11-07 ENCOUNTER — Ambulatory Visit: Payer: Medicare Other | Attending: Family Medicine | Admitting: Rehabilitative and Restorative Service Providers"

## 2022-11-07 ENCOUNTER — Encounter: Payer: Self-pay | Admitting: Rehabilitative and Restorative Service Providers"

## 2022-11-07 DIAGNOSIS — R2689 Other abnormalities of gait and mobility: Secondary | ICD-10-CM | POA: Diagnosis not present

## 2022-11-07 DIAGNOSIS — R5381 Other malaise: Secondary | ICD-10-CM | POA: Insufficient documentation

## 2022-11-07 DIAGNOSIS — M6281 Muscle weakness (generalized): Secondary | ICD-10-CM | POA: Insufficient documentation

## 2022-11-07 NOTE — Therapy (Signed)
OUTPATIENT PHYSICAL THERAPY TREATMENT NOTE   Patient Name: BROADY Ibarra MRN: [id number] DOB:09-27-1950, 72 y.o., male Today's Date: 11/07/2022  END OF SESSION:  PT End of Session - 11/07/22 0934     Visit Number 9    Date for PT Re-Evaluation 11/17/22    Authorization Type [employer] Medicare    PT Start Time 0930    PT Stop Time 1010    PT Time Calculation (min) 40 min    Activity Tolerance Patient tolerated treatment well    Behavior During Therapy [facility] for tasks assessed/performed             Past Medical History:  Diagnosis Date   History of nonmelanoma skin cancer    Hypercholesteremia    Hypertension    Trigeminal neuralgia    Past Surgical History:  Procedure Laterality Date   HERNIA REPAIR  11/07/11   [facility]   INGUINAL HERNIA REPAIR  11/07/2011   Procedure: LAPAROSCOPIC INGUINAL HERNIA;  Surgeon: James Ina, MD,FACS;  Location: [facility];  Service: General;  Laterality: Left;   RETINAL DETACHMENT SURGERY  1997   RIGHT/LEFT HEART CATH AND CORONARY ANGIOGRAPHY N/A 10/12/2022   Procedure: RIGHT/LEFT HEART CATH AND CORONARY ANGIOGRAPHY;  Surgeon: James Pyo, MD;  Location: [facility];  Service: Cardiovascular;  Laterality: N/A;   SKIN CANCER EXCISION  2012, 1999   basal cell - neck and hand   TEE WITHOUT CARDIOVERSION N/A 10/16/2022   Procedure: TRANSESOPHAGEAL ECHOCARDIOGRAM;  Surgeon: James Constant, MD;  Location: [facility];  Service: Cardiovascular;  Laterality: N/A;   Patient Active Problem List   Diagnosis Date Noted   Protein-calorie malnutrition, severe 08/19/2022   Alcohol withdrawal (HCC) 08/14/2022   Acute metabolic encephalopathy 08/14/2022   Paroxysmal atrial fibrillation with RVR (HCC) 08/14/2022   Nonrheumatic mitral valve regurgitation 08/14/2022   Mitral valve prolapse 08/14/2022   PNA (pneumonia) 08/12/2022   Severe sepsis (HCC) 08/12/2022   HLD (hyperlipidemia) 08/12/2022   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   Trigeminal  neuralgia of left side of face 07/15/2019   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  Denies current pain.   PERTINENT HISTORY: Recent Pneumonia, Squamous Cell Carcinoma, HTN  PAIN:  Are you having pain? Yes: NPRS scale: currently 0/10 Pain location: right hand Pain description: aching Aggravating factors: from injections/IVs Relieving factors: getting up and moving around  PRECAUTIONS: None  RED FLAGS: None   WEIGHT BEARING RESTRICTIONS: No  FALLS:  Ibarra patient fallen in last 6 months? No  LIVING ENVIRONMENT: Lives with: lives with their spouse Lives in: House/apartment Stairs: Yes: Internal: 14 steps; can reach both and External: 8 steps; can reach both Ibarra following equipment at home: shower chair and Grab bars  OCCUPATION: Retired, but worked as a Materials engineer  PLOF: Independent and Leisure: spending time with family, play guitar  PATIENT GOALS: To be steady on my feet and have more endurance and improve muscle tone.  NEXT MD VISIT:  routine yearly physical with Dr James Ibarra  in a couple of months.   Pt reports that he may be having a heart surgery later this year to repair his Mitral Valve  OBJECTIVE:    PATIENT SURVEYS:  Eval:  LEFS 52 / 80 = 65.0 %  COGNITION: Overall cognitive status: Within functional limits for tasks assessed     SENSATION: Pt reports some numbness and tingling in his feet   MUSCLE LENGTH: Hamstrings:  tightness noted bilat  POSTURE: rounded shoulders and forward head  LOWER EXTREMITY ROM:  WFL  LOWER EXTREMITY MMT:  09/20/2022:   Bilateral hip and hamstring strength of 4/5 Bilateral quad strength of 5/5   FUNCTIONAL TESTS:  Eval: 5 times sit to stand: 16.32 sec with bilat UE (attempted sit to stand without hands, and unable to perform) 3 minute walk test: 494 ft with RPE of 2/10 Single leg Stance:  Right- 2.68 sec, Left- 2.73 sec  10/17/2022: 3 minute walk test:  611 ft with RPE of 2-3/10  10/24/2022: 5 times sit to stand:  13.42 sec without UE support with RPE of 2-3/10 30 sec chair stand:  9 complete stands with RPE of 5/10 (unable to complete 10th stand before time expired)  11/02/2022: 3 minute walk test:  582 ft with RPE of 2/10  11/07/2022: 3 minute walk test: 765 ft 5 times sit to stand:  10.94 sec  GAIT: Distance walked: >500 ft Assistive device utilized: None Level of assistance: Complete Independence Comments: Reports that he does get a little bit more winded than he did prior to his Pneumonia   TODAY'S TREATMENT:      DATE: 11/07/2022  Nustep level 5 x4 min with PT present to discuss status 3 minute ambulation:  765 ft Seated hamstring stretch 2x20 sec bilat Seated on chair with foam pad for cushion/elevated surface holding 5# kettlebell sit to stand:  x10 with chest press, x10 with overhead press Standing single leg stance 2x20 sec bilat with UE support as needed Standing tandem stance 2x20 sec each leg leading 2x20 sec bilat Standing in parallel bars on foam pad:  hip abduction, hip extension, HS curl.  2x10 each bilat Seated right lateral side-bend over peanut ball 2x10 Marching on trampoline with unilateral UE support x2 min   DATE: 11/02/2022  Nustep level 5 x6 min with PT present to discuss status Seated hamstring stretch 2x20 sec bilat 3 minutes ambulation in PT clinic for 582 ft with RPE of 2/10 Seated on chair with foam pad for  cushion/elevated surface holding 5# kettlebell sit to stand:  x10 with chest press, x10 with overhead press [employer] holding 15# kettlebell 2x100 ft in each arm Standing at counter standing on foam pad:  hip extension, hip abduction, HS curl.  2x10 each bilat FWD step up onto metal stool x10 bilat (with UE support) Seated right lateral side-bend over peanut ball 2x10   DATE: 10/31/2022  Nustep level 5 x7 min with PT present to discuss status Standing hamstring stretch at stairs 2x20 sec bilat FWD step ups on 6" stair without UE support 2x10 bilat Lateral side step ups on 6" sep with occasional UE support 2x10 bilat Seated on chair with foam pad for cushion/elevated surface holding 5# kettlebell sit to stand:  x10 with chest press, x10 with overhead press [employer] holding 15# kettlebell x100 ft in each arm Standing at counter:  hip extension, hip abduction, HS curl.  2x10 each bilat    PATIENT EDUCATION:  Education details: Issued HEP Person educated: Patient  Education method: Explanation, Demonstration, and Handouts Education comprehension: verbalized understanding and returned demonstration  HOME EXERCISE PROGRAM: Access Code: [email or web address] URL: [email or web address] Date: 11/07/2022 Prepared by: Clydie Braun Brigitt Mcclish  Exercises - Seated Hamstring Stretch  - 1 x daily - 7 x weekly - 2 reps - 20 sec hold - Sit to Stand  - 1 x daily - 7 x weekly - 2 sets - 5 reps - Standing Hip Abduction with Counter Support  - 1 x daily - 7 x weekly - 2 sets - 10 reps - Standing Hip Extension with Counter Support  - 1 x daily - 7 x weekly - 2 sets - 10 reps - Standing March with Counter Support  - 1 x daily - 7 x weekly - 2 sets - 10 reps - Heel Raises with Counter Support  - 1 x daily - 7 x weekly - 2 sets - 10 reps - Single Leg Stance with Support  - 1 x daily - 7 x weekly - 2 reps - 20 sec hold - Tandem Stance with Support  - 1 x daily - 7 x weekly - 2 reps - 20 sec  hold  ASSESSMENT:  CLINICAL IMPRESSION: Mr Dacres presents to skilled PT reporting that he Ibarra been walking his dog.  Patient Ibarra made progress towards goal related activities and Ibarra met LTG for 3 minute walk test and 5 times sit to stand.  Patient provided with updated HEP to include balance exercises.  Printed patient an exercise tracker to assist him with his improved compliance with HEP.  Patient is on track for discharge at the end of this certification period, as anticipated.    OBJECTIVE IMPAIRMENTS: decreased balance, decreased endurance, difficulty walking, decreased strength, increased muscle spasms, impaired flexibility, postural dysfunction, and pain.   ACTIVITY LIMITATIONS: lifting, bending, and stairs  PARTICIPATION LIMITATIONS: cleaning, community activity, and yard work  PERSONAL FACTORS: Age, Past/current experiences, and 1 comorbidity: recent Pneumonia  are also affecting patient's functional outcome.   REHAB POTENTIAL: Good  CLINICAL DECISION MAKING: Stable/uncomplicated  EVALUATION COMPLEXITY: Low   GOALS: Goals reviewed with patient? Yes  SHORT TERM GOALS: Target date: 10/06/2022 Pt will be independent with initial HEP. Baseline: Goal status: MET  2.  Patient will report ability to walk his dog without getting short of breath. Baseline:  Goal status: MET on 10/17/2022   LONG TERM GOALS: Target date: 11/17/2022  Pt will be independent with advanced HEP. Baseline:  Goal status: Ongoing  2.  Pt will increase 3 minute walk test to greater 700 ft without dyspnea to allow for community ambulation. Baseline: 494 ft Goal status: MET on 11/07/2022  3.  Pt will increase bilat hip strength to at least 4+/5 to allow him to perform sit to stand without UE use and navigate stairs in home with decreased difficulty. Baseline:  Goal status: Ongoing  4.  Pt will increase single leg stance to at least 15 seconds on each side to decrease risk of falling. Baseline: less  than 3 seconds on each side Goal status: Ongoing  5.  Pt will improve 5 times sit to stand to 13 seconds or less without UE use to demonstrate increased functional strength. Baseline: 13.32 sec Goal status: MET on 11/07/2022    PLAN:  PT FREQUENCY: 2x/week  PT DURATION: 8 weeks  PLANNED INTERVENTIONS: Therapeutic exercises, Therapeutic activity, Neuromuscular re-education, Balance training, Gait training, Patient/Family education, Self Care, Joint mobilization, Joint manipulation, Stair training, Aquatic Therapy, Dry Needling, Electrical stimulation, Spinal manipulation,  Spinal mobilization, Cryotherapy, Moist heat, Taping, Traction, Ultrasound, Ionotophoresis 4mg /ml Dexamethasone, Manual therapy, and Re-evaluation  PLAN FOR NEXT SESSION:  progress HEP as indicated, strengthening (Ibarra 3 and 5 pound weights at home), gait   Reather Laurence, PT, DPT 11/07/22, 10:18 AM   [facility] [address] [place], [place] [address] Phone # [phone] Fax [phone]

## 2022-11-09 ENCOUNTER — Ambulatory Visit: Payer: Medicare Other | Admitting: Rehabilitative and Restorative Service Providers"

## 2022-11-09 ENCOUNTER — Encounter: Payer: Self-pay | Admitting: Rehabilitative and Restorative Service Providers"

## 2022-11-09 DIAGNOSIS — M6281 Muscle weakness (generalized): Secondary | ICD-10-CM | POA: Diagnosis not present

## 2022-11-09 DIAGNOSIS — R5381 Other malaise: Secondary | ICD-10-CM

## 2022-11-09 DIAGNOSIS — R2689 Other abnormalities of gait and mobility: Secondary | ICD-10-CM | POA: Diagnosis not present

## 2022-11-09 NOTE — Therapy (Signed)
OUTPATIENT PHYSICAL THERAPY TREATMENT NOTE   Patient Name: James Ibarra MRN: [id number] DOB:[birth year], 72 y.o., male Today's Date: 11/09/2022   Progress Note Reporting Period 09/20/2022 to 11/09/2022.  See note below for Objective Data and Assessment of Progress/Goals.      END OF SESSION:  PT End of Session - 11/09/22 1014     Visit Number 10    Date for PT Re-Evaluation 11/17/22    Authorization Type [employer] Medicare    Progress Note Due on Visit 20    PT Start Time 1013    PT Stop Time 1051    PT Time Calculation (min) 38 min    Activity Tolerance Patient tolerated treatment well    Behavior During Therapy WFL for tasks assessed/performed             Past Medical History:  Diagnosis Date   History of nonmelanoma skin cancer    Hypercholesteremia    Hypertension    Trigeminal neuralgia    Past Surgical History:  Procedure Laterality Date   HERNIA REPAIR  11/07/11   [facility]   INGUINAL HERNIA REPAIR  11/07/2011   Procedure: LAPAROSCOPIC INGUINAL HERNIA;  Surgeon: Atilano Ina, MD,FACS;  Location: [facility];  Service: General;  Laterality: Left;   RETINAL DETACHMENT SURGERY  1997   RIGHT/LEFT HEART CATH AND CORONARY ANGIOGRAPHY N/A 10/12/2022   Procedure: RIGHT/LEFT HEART CATH AND CORONARY ANGIOGRAPHY;  Surgeon: [name], MD;  Location: [facility];  Service: Cardiovascular;  Laterality: N/A;   SKIN CANCER EXCISION  2012, 1999   basal cell - neck and hand   TEE WITHOUT CARDIOVERSION N/A 10/16/2022   Procedure: TRANSESOPHAGEAL ECHOCARDIOGRAM;  Surgeon: Christell Constant, MD;  Location: [facility];  Service: Cardiovascular;  Laterality: N/A;   Patient Active Problem List   Diagnosis Date Noted   Protein-calorie malnutrition, severe 08/19/2022   Alcohol withdrawal (HCC) 08/14/2022   Acute metabolic encephalopathy 08/14/2022   Paroxysmal atrial fibrillation with RVR (HCC) 08/14/2022   Nonrheumatic mitral valve regurgitation 08/14/2022   Mitral  valve prolapse 08/14/2022   PNA (pneumonia) 08/12/2022   Severe sepsis (HCC) 08/12/2022   HLD (hyperlipidemia) 08/12/2022   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   Trigeminal neuralgia of left side of face 07/15/2019   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   PERTINENT HISTORY: Recent Pneumonia, Squamous Cell Carcinoma, HTN  PAIN:  Are you having pain? Yes: NPRS scale: currently 0/10 Pain location: right hand Pain description: aching Aggravating factors: from injections/IVs Relieving factors: getting up and moving around  PRECAUTIONS: None  RED FLAGS: None   WEIGHT BEARING RESTRICTIONS: No  FALLS:  Has patient fallen in last 6 months? No  LIVING ENVIRONMENT: Lives with: lives with their spouse Lives in: House/apartment Stairs: Yes: Internal: 14 steps; can reach both and External: 8 steps; can reach both Has following equipment at home: shower chair and Grab bars  OCCUPATION: Retired, but worked as a Materials engineer  PLOF: Independent and  Leisure: spending time with family, play guitar  PATIENT GOALS: To be steady on my feet and have more endurance and improve muscle tone.  NEXT MD VISIT:  routine yearly physical with Dr [name] in a couple of months.   Pt reports that he may be having a heart surgery later this year to repair his Mitral Valve  OBJECTIVE:    PATIENT SURVEYS:  Eval:  LEFS 52 / 80 = 65.0  %  COGNITION: Overall cognitive status: Within functional limits for tasks assessed     SENSATION: Pt reports some numbness and tingling in his feet   MUSCLE LENGTH: Hamstrings: tightness noted bilat  POSTURE: rounded shoulders and forward head  LOWER EXTREMITY ROM:  WFL  LOWER EXTREMITY MMT:  09/20/2022:   Bilateral hip and hamstring strength of 4/5 Bilateral quad strength of 5/5   FUNCTIONAL TESTS:  Eval: 5 times sit to stand: 16.32 sec with bilat UE (attempted sit to stand without hands, and unable to perform) 3 minute walk test: 494 ft with RPE of 2/10 Single leg Stance:  Right- 2.68 sec, Left- 2.73 sec  10/17/2022: 3 minute walk test:  611 ft with RPE of 2-3/10  10/24/2022: 5 times sit to stand:  13.42 sec without UE support with RPE of 2-3/10 30 sec chair stand:  9 complete stands with RPE of 5/10 (unable to complete 10th stand before time expired)  11/02/2022: 3 minute walk test:  582 ft with RPE of 2/10  11/07/2022: 3 minute walk test: 765 ft 5 times sit to stand:  10.94 sec  GAIT: Distance walked: >500 ft Assistive device utilized: None Level of assistance: Complete Independence Comments: Reports that he does get a little bit more winded than he did prior to his Pneumonia   TODAY'S TREATMENT:      DATE: 11/07/2022  Nustep level 6 x6 min with PT present to discuss status Sit to/from stand holding 10# kettlebell:  2x5 with chest press, 2x5 with overhead press [employer] holding 15# kettlebell x200 ft in each arm Seated lateral trunk side-bend 2x10 bilat Standing at barre on foam pad:  hip abduction, hip extension, HS curl.  2x10 each bilat Leg Press (seat at 8) 80# 2x10   DATE: 11/07/2022  Nustep level 5 x4 min with PT present to discuss status 3 minute ambulation:  765 ft Seated hamstring stretch 2x20 sec bilat Seated on chair with foam pad for cushion/elevated surface holding 5# kettlebell sit to stand:  x10 with chest press, x10 with overhead  press Standing single leg stance 2x20 sec bilat with UE support as needed Standing tandem stance 2x20 sec each leg leading 2x20 sec bilat Standing in parallel bars on foam pad:  hip abduction, hip extension, HS curl.  2x10 each bilat Seated right lateral side-bend over peanut ball 2x10 Marching on trampoline with unilateral UE support x2 min   DATE: 11/02/2022  Nustep level 5 x6 min with PT present to discuss status Seated hamstring stretch 2x20 sec bilat 3 minutes ambulation in PT clinic for 582 ft with RPE of 2/10 Seated on chair with foam pad for cushion/elevated surface holding 5# kettlebell sit to stand:  x10 with chest press, x10 with overhead press [employer] holding 15# kettlebell 2x100 ft in each arm Standing at counter standing on foam pad:  hip extension, hip abduction, HS curl.  2x10 each bilat FWD step up onto metal stool x10 bilat (with UE support) Seated left lateral side-bend over peanut ball 2x10   DATE:  10/31/2022  Nustep level 5 x7 min with PT present to discuss status Standing hamstring stretch at stairs 2x20 sec bilat FWD step ups on 6" stair without UE support 2x10 bilat Lateral side step ups on 6" sep with occasional UE support 2x10 bilat Seated on chair with foam pad for cushion/elevated surface holding 5# kettlebell sit to stand:  x10 with chest press, x10 with overhead press [employer] holding 15# kettlebell x100 ft in each arm Standing at counter:  hip extension, hip abduction, HS curl.  2x10 each bilat    PATIENT EDUCATION:  Education details: Issued HEP Person educated: Patient Education method: Explanation, Demonstration, and Handouts Education comprehension: verbalized understanding and returned demonstration  HOME EXERCISE PROGRAM: Access Code: [email or web address] URL: [email or web address] Date: 11/07/2022 Prepared by: Clydie Braun Jennah Satchell  Exercises - Seated Hamstring Stretch  - 1 x daily - 7 x weekly - 2 reps - 20 sec hold - Sit to  Stand  - 1 x daily - 7 x weekly - 2 sets - 5 reps - Standing Hip Abduction with Counter Support  - 1 x daily - 7 x weekly - 2 sets - 10 reps - Standing Hip Extension with Counter Support  - 1 x daily - 7 x weekly - 2 sets - 10 reps - Standing March with Counter Support  - 1 x daily - 7 x weekly - 2 sets - 10 reps - Heel Raises with Counter Support  - 1 x daily - 7 x weekly - 2 sets - 10 reps - Single Leg Stance with Support  - 1 x daily - 7 x weekly - 2 reps - 20 sec hold - Tandem Stance with Support  - 1 x daily - 7 x weekly - 2 reps - 20 sec hold  ASSESSMENT:  CLINICAL IMPRESSION: Mr Brenda presents to skilled PT reporting that he has been walking his dog.  Patient had functional assessments last visit and has met LTG for 3 minute walk test and 5 times sit to stand.  Pt was able to perform sit to stand exercises without using elevated surface during session today.  Patient is making great progress with PT and is on track for discharge next week, as anticipated.     OBJECTIVE IMPAIRMENTS: decreased balance, decreased endurance, difficulty walking, decreased strength, increased muscle spasms, impaired flexibility, postural dysfunction, and pain.   ACTIVITY LIMITATIONS: lifting, bending, and stairs  PARTICIPATION LIMITATIONS: cleaning, community activity, and yard work  PERSONAL FACTORS: Age, Past/current experiences, and 1 comorbidity: recent Pneumonia  are also affecting patient's functional outcome.   REHAB POTENTIAL: Good  CLINICAL DECISION MAKING: Stable/uncomplicated  EVALUATION COMPLEXITY: Low   GOALS: Goals reviewed with patient? Yes  SHORT TERM GOALS: Target date: 10/06/2022 Pt will be independent with initial HEP. Baseline: Goal status: MET  2.  Patient will report ability to walk his dog without getting short of breath. Baseline:  Goal status: MET on 10/17/2022   LONG TERM GOALS: Target date: 11/17/2022  Pt will be independent with advanced HEP. Baseline:  Goal  status: Ongoing  2.  Pt will increase 3 minute walk test to greater 700 ft without dyspnea to allow for community ambulation. Baseline: 494 ft Goal status: MET on 11/07/2022  3.  Pt will increase bilat hip strength to at least 4+/5 to allow him to perform sit to stand without UE use and navigate stairs in home with decreased difficulty. Baseline:  Goal status: Ongoing  4.  Pt will increase single leg stance to  at least 15 seconds on each side to decrease risk of falling. Baseline: less than 3 seconds on each side Goal status: Ongoing  5.  Pt will improve 5 times sit to stand to 13 seconds or less without UE use to demonstrate increased functional strength. Baseline: 13.32 sec Goal status: MET on 11/07/2022    PLAN:  PT FREQUENCY: 2x/week  PT DURATION: 8 weeks  PLANNED INTERVENTIONS: Therapeutic exercises, Therapeutic activity, Neuromuscular re-education, Balance training, Gait training, Patient/Family education, Self Care, Joint mobilization, Joint manipulation, Stair training, Aquatic Therapy, Dry Needling, Electrical stimulation, Spinal manipulation, Spinal mobilization, Cryotherapy, Moist heat, Taping, Traction, Ultrasound, Ionotophoresis 4mg /ml Dexamethasone, Manual therapy, and Re-evaluation  PLAN FOR NEXT SESSION:  progress HEP as indicated, strengthening, balance, preparation for discharge   Sharai Overbay, PT, DPT 11/09/22, 11:38 AM   [facility] [address] [place], [place] [address] Phone # [phone] Fax [phone]

## 2022-11-12 NOTE — Progress Notes (Unsigned)
[address]       [place] [address]             [phone]           James Ibarra [place] Medical Record #[id number] Date of Birth: [birth year]  Farris Has, MD Farris Has, MD  Chief Complaint:  severe MR and atrial fibrillation   History of Present Illness:     Pt is a very pleasant 72 yo male who was found to have a heart murmur by his PCP this spring and underwent TTE which showed severe MR and preserved LV function. Pt was set up to see cardiology however developed acute SOB and was admitted with a pneumonia and atrial fibrillation. Pt was controlled medically and improved and discharged on NOAC. 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 Pt had a cath with no PHTN but elevated LVEDP. He had no CAD. Pt had a holter monitor without afib. Pt continues to feel palpitations especially lying on left side and is DOE.       Past Medical History:  Diagnosis Date   History of nonmelanoma skin cancer    Hypercholesteremia    Hypertension    Trigeminal neuralgia     Past Surgical History:  Procedure Laterality Date   HERNIA REPAIR  11/07/11   [facility]   INGUINAL HERNIA REPAIR  11/07/2011   Procedure: LAPAROSCOPIC INGUINAL HERNIA;  Surgeon: Atilano Ina, MD,FACS;  Location: [facility];  Service: General;  Laterality: Left;   RETINAL DETACHMENT SURGERY  1997   RIGHT/LEFT HEART CATH AND CORONARY ANGIOGRAPHY N/A 10/12/2022   Procedure: RIGHT/LEFT HEART CATH AND CORONARY ANGIOGRAPHY;  Surgeon: [name], MD;  Location: [facility];  Service: Cardiovascular;  Laterality: N/A;   SKIN CANCER EXCISION  2012, 1999   basal cell - neck and hand   TEE WITHOUT CARDIOVERSION N/A 10/16/2022   Procedure: TRANSESOPHAGEAL ECHOCARDIOGRAM;  Surgeon: Christell Constant, MD;  Location: [facility];  Service: Cardiovascular;  Laterality: N/A;    Social History   Tobacco Use  Smoking Status Never  Smokeless Tobacco Never    Social History   Substance and Sexual Activity  Alcohol Use Yes   Comment: 3 glasses of wine a day    Social History   Socioeconomic History   Marital status: Married    Spouse name: Not on file   Number of children: Not on file   Years of education: Not on file   Highest education level: Not on file  Occupational History   Not on file  Tobacco Use   Smoking status: Never   Smokeless tobacco: Never  Substance and Sexual Activity   Alcohol use: Yes    Comment: 3 glasses of wine a day   Drug use: No   Sexual activity: Not on file  Other Topics Concern   Not on file  Social History Narrative   Not on file   Social Determinants of Health   Financial Resource Strain: Not on file  Food Insecurity: No Food Insecurity (08/12/2022)   Hunger Vital Sign    Worried About Running Out of Food in the Last Year: Never true    Ran Out of Food in the Last Year: Never true  Transportation  Needs: No Transportation Needs (08/12/2022)   PRAPARE - Administrator, Civil Service (Medical): No    Lack of Transportation (Non-Medical): No  Physical Activity: Not on file  Stress: Not on file  Social Connections: Not on file  Intimate Partner Violence: Not At Risk (08/12/2022)   Humiliation, Afraid, Rape, and Kick questionnaire    Fear of Current or Ex-Partner: No    Emotionally Abused: No    Physically Abused: No    Sexually Abused: No    No Known Allergies  Current Outpatient Medications  Medication Sig Dispense Refill   albuterol (VENTOLIN HFA) 108 (90 Base) MCG/ACT inhaler Inhale 1-2 puffs into the lungs every 6 (six) hours as needed for wheezing or shortness of breath.     apixaban (ELIQUIS) 5 MG TABS tablet Take 1 tablet (5 mg total) by mouth 2 (two) times daily. 60 tablet    folic acid (FOLVITE) 1 MG tablet  Take 1 tablet (1 mg total) by mouth daily.     gabapentin (NEURONTIN) 300 MG capsule TAKE 2 CAPSULES BY MOUTH 4 TIMES DAILY 480 capsule 5   hydrocortisone 2.5 % ointment Apply 1 Application topically 2 (two) times daily as needed (itching).     ketoconazole (NIZORAL) 2 % cream Apply 1 Application topically as needed for irritation.     melatonin 3 MG TABS tablet Take 3 mg by mouth at bedtime.     metoprolol tartrate (LOPRESSOR) 25 MG tablet Take 1 tablet (25 mg total) by mouth 2 (two) times daily.     Multiple Vitamins-Minerals (PRESERVISION AREDS 2) CAPS Take 1 capsule by mouth 2 (two) times daily.     olmesartan (BENICAR) 20 MG tablet Take 20 mg by mouth daily.     Polyethyl Glycol-Propyl Glycol (SYSTANE OP) Place 1 drop into both eyes daily.     pravastatin (PRAVACHOL) 10 MG tablet Take 10 mg by mouth at bedtime.     sildenafil (REVATIO) 20 MG tablet Take 40-60 mg by mouth daily as needed (ED).     Thiamine HCl (VITAMIN B-1) 250 MG tablet Take 250 mg by mouth daily.     traZODone (DESYREL) 100 MG tablet Take 100-150 mg by mouth at bedtime.     No current facility-administered medications for this visit.     Family History  Problem Relation Age of Onset   Heart disease Father    Heart disease Mother        Physical Exam: Lungs: clear Card: irreg with 3/6 sem Ext: warm no edema Neuro: intact Teeth in good repair     Diagnostic Studies & Laboratory data: I have personally reviewed the following studies and agree with the findings   Cath (10/2022) Conclusion  1.  Very minimal luminal abnormalities of LAD with otherwise normal right dominant circulation. 2.  Fick cardiac output of 4.9 L/min and Fick cardiac index of 2.6 L/min/m with the following hemodynamics:             Right atrial pressure mean of 9 mmHg             Right ventricular pressure 36/5 with an end-diastolic pressure of 18 mmHg             Mean wedge pressure of 18 mmHg with V waves to 25 mmHg             PA  pressure of 38/16 with a mean of 23 mmHg  PVR of less than 3 Woods units 3.  LVEDP of 26 mmHg.   TEE(10/2022) IMPRESSIONS     1. 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  Systolic blunting of the pulmonary veins. 3D Vena contracta 0.387 cm2.  Rotated cardiac silhouette. The mitral valve is myxomatous. Moderate to  severe mitral valve regurgitation.   2. Left ventricular ejection fraction, by estimation, is 60%. The left  ventricle has normal function.   3. Right ventricular systolic function is normal. The right ventricular  size is normal.   4. Left atrial size was mildly dilated. No left atrial/left atrial  appendage thrombus was detected.   5. Right atrial size was mildly dilated.   6. The aortic valve is tricuspid. Aortic valve regurgitation is not  visualized. No aortic stenosis is present.   7. There is mild (Grade II) plaque involving the descending aorta.   8. Small PFO.   FINDINGS   Left Ventricle: Left ventricular ejection fraction, by estimation, is  60%. The left ventricle has normal function. The left ventricular internal  cavity size was normal in size.   Right Ventricle: The right ventricular size is normal. No increase in  right ventricular wall thickness. Right ventricular systolic function is  normal.   Left Atrium: Left atrial size was mildly dilated. No left atrial/left  atrial appendage thrombus was detected.   Right Atrium: Right atrial size was mildly dilated.   Pericardium: There is no evidence of pericardial effusion.   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 Systolic blunting of the pulmonary veins. 3D Vena contracta  0.387 cm2. Rotated cardiac silhouette. The mitral valve is myxomatous.  Moderate to severe mitral valve  regurgitation.   Tricuspid Valve: The tricuspid valve is normal in structure. Tricuspid  valve regurgitation is mild.   Aortic Valve: The aortic  valve is tricuspid. Aortic valve regurgitation is  not visualized. No aortic stenosis is present.   Pulmonic Valve: The pulmonic valve was normal in structure. Pulmonic valve  regurgitation is mild to moderate. No evidence of pulmonic stenosis.   Aorta: The aortic root, ascending aorta, aortic arch and descending aorta  are all structurally normal, with no evidence of dilitation or  obstruction. There is mild (Grade II) plaque involving the descending  aorta.   IAS/Shunts: Small PFO.   Additional Comments: Spectral Doppler performed.     3D Volume EF  LV 3D EDV:   81.18 ml  LV 3D ESV:   35.83 ml   Recent Radiology Findings:       Recent Lab Findings: Lab Results  Component Value Date   WBC 4.6 10/04/2022   HGB 11.9 (L) 10/12/2022   HCT 35.0 (L) 10/12/2022   PLT 166 10/04/2022   GLUCOSE 94 10/04/2022   CHOL 156 10/04/2022   TRIG 106 10/04/2022   HDL 54 10/04/2022   LDLCALC 83 10/04/2022   ALT 16 10/04/2022   AST 19 10/04/2022   NA 141 10/12/2022   K 3.4 (L) 10/12/2022   CL 105 10/04/2022   CREATININE 0.76 10/04/2022   BUN 12 10/04/2022   CO2 27 10/04/2022   INR 1.3 (H) 08/13/2022      Assessment / Plan:     72 yo male with NYHA class 2 symptoms of severe MR from posterior prolapse into the posterior commisure. Pt would benefit from surgery which would include maze procedure and clipping of LA appendage. I feel that with complex nature of repair would best be  done by sternotomy. All the risks and goals and recovery of surgery were discussed and pt understands and wishes to proceed. Will need off eliquis for 5 days and will obtain routine preoperative testing.    I have spent 60 min in review of the records, viewing studies and in face to face with patient and in coordination of future care    James Ibarra 11/12/2022 5:59 PM

## 2022-11-13 ENCOUNTER — Encounter: Payer: Self-pay | Admitting: Thoracic Surgery (Cardiothoracic Vascular Surgery)

## 2022-11-13 ENCOUNTER — Other Ambulatory Visit: Payer: Self-pay | Admitting: Thoracic Surgery (Cardiothoracic Vascular Surgery)

## 2022-11-13 ENCOUNTER — Institutional Professional Consult (permissible substitution): Payer: Medicare Other | Admitting: Thoracic Surgery (Cardiothoracic Vascular Surgery)

## 2022-11-13 VITALS — BP 122/85 | HR 99 | Resp 18 | Ht 74.0 in | Wt 152.0 lb

## 2022-11-13 DIAGNOSIS — I480 Paroxysmal atrial fibrillation: Secondary | ICD-10-CM | POA: Diagnosis not present

## 2022-11-13 DIAGNOSIS — I340 Nonrheumatic mitral (valve) insufficiency: Secondary | ICD-10-CM

## 2022-11-13 NOTE — Patient Instructions (Signed)
Noncontrast CT of chest Mitral valve repair Maze

## 2022-11-14 ENCOUNTER — Encounter: Payer: Self-pay | Admitting: *Deleted

## 2022-11-14 ENCOUNTER — Ambulatory Visit: Payer: Medicare Other | Admitting: Rehabilitative and Restorative Service Providers"

## 2022-11-14 ENCOUNTER — Encounter: Payer: Self-pay | Admitting: Rehabilitative and Restorative Service Providers"

## 2022-11-14 ENCOUNTER — Other Ambulatory Visit: Payer: Self-pay | Admitting: *Deleted

## 2022-11-14 DIAGNOSIS — R5381 Other malaise: Secondary | ICD-10-CM | POA: Diagnosis not present

## 2022-11-14 DIAGNOSIS — R2689 Other abnormalities of gait and mobility: Secondary | ICD-10-CM | POA: Diagnosis not present

## 2022-11-14 DIAGNOSIS — M6281 Muscle weakness (generalized): Secondary | ICD-10-CM

## 2022-11-14 DIAGNOSIS — I480 Paroxysmal atrial fibrillation: Secondary | ICD-10-CM

## 2022-11-14 DIAGNOSIS — I340 Nonrheumatic mitral (valve) insufficiency: Secondary | ICD-10-CM

## 2022-11-14 NOTE — Therapy (Signed)
OUTPATIENT PHYSICAL THERAPY TREATMENT NOTE   Patient Name: TAYJON TRETTEL MRN: [id number] DOB:1950-09-03, 72 y.o., male Today's Date: 11/14/2022    END OF SESSION:  PT End of Session - 11/14/22 0932     Visit Number 11    Date for PT Re-Evaluation 11/17/22    Authorization Type [employer] Medicare    Progress Note Due on Visit 20    PT Start Time 0930    PT Stop Time 1010    PT Time Calculation (min) 40 min    Activity Tolerance Patient tolerated treatment well    Behavior During Therapy [facility] for tasks assessed/performed             Past Medical History:  Diagnosis Date   History of nonmelanoma skin cancer    Hypercholesteremia    Hypertension    Trigeminal neuralgia    Past Surgical History:  Procedure Laterality Date   HERNIA REPAIR  11/07/11   [facility]   INGUINAL HERNIA REPAIR  11/07/2011   Procedure: LAPAROSCOPIC INGUINAL HERNIA;  Surgeon: Atilano Ina, MD,FACS;  Location: [facility];  Service: General;  Laterality: Left;   RETINAL DETACHMENT SURGERY  1997   RIGHT/LEFT HEART CATH AND CORONARY ANGIOGRAPHY N/A 10/12/2022   Procedure: RIGHT/LEFT HEART CATH AND CORONARY ANGIOGRAPHY;  Surgeon: [name], MD;  [facility]: [facility];  Service: Cardiovascular;  Laterality: N/A;   SKIN CANCER EXCISION  2012, 1999   basal cell - neck and hand   TEE WITHOUT CARDIOVERSION N/A 10/16/2022   Procedure: TRANSESOPHAGEAL ECHOCARDIOGRAM;  Surgeon: Christell Constant, MD;  Location: [facility];  Service: Cardiovascular;  Laterality: N/A;   Patient Active Problem List   Diagnosis Date Noted   Protein-calorie malnutrition, severe 08/19/2022   Alcohol withdrawal (HCC) 08/14/2022   Acute metabolic encephalopathy 08/14/2022   Paroxysmal atrial fibrillation with RVR (HCC) 08/14/2022   Nonrheumatic mitral valve regurgitation 08/14/2022   Mitral valve prolapse 08/14/2022   PNA (pneumonia) 08/12/2022   Severe sepsis (HCC) 08/12/2022   HLD (hyperlipidemia) 08/12/2022    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   Trigeminal neuralgia of left side of face 07/15/2019   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   PERTINENT HISTORY: Recent Pneumonia, Squamous Cell Carcinoma, HTN  PAIN:  Are you having pain? Yes: NPRS scale: currently 0/10 Pain location: right hand Pain description: aching Aggravating factors: from injections/IVs Relieving factors: getting up and moving around  PRECAUTIONS: None  RED FLAGS: None   WEIGHT BEARING RESTRICTIONS: No  FALLS:  Has patient fallen in last 6 months? No  LIVING ENVIRONMENT: Lives with: lives with their spouse Lives in: House/apartment Stairs: Yes: Internal: 14 steps; can reach both and External: 8 steps; can reach both Has following equipment at home: shower chair and Grab bars  OCCUPATION: Retired, but worked as a Materials engineer  PLOF: Independent and Leisure: spending time with family, play guitar  PATIENT GOALS: To be steady  on my feet and have more endurance and improve muscle tone.  NEXT MD VISIT:  routine yearly physical with Dr [name] in a couple of months.   Pt reports that he may be having a heart surgery later this year to repair his Mitral Valve  OBJECTIVE:    PATIENT SURVEYS:  Eval:  LEFS 52 / 80 = 65.0 %  COGNITION: Overall cognitive status: Within functional limits for tasks  assessed     SENSATION: Pt reports some numbness and tingling in his feet   MUSCLE LENGTH: Hamstrings: tightness noted bilat  POSTURE: rounded shoulders and forward head  LOWER EXTREMITY ROM:  WFL  LOWER EXTREMITY MMT:  09/20/2022:   Bilateral hip and hamstring strength of 4/5 Bilateral quad strength of 5/5   FUNCTIONAL TESTS:  Eval: 5 times sit to stand: 16.32 sec with bilat UE (attempted sit to stand without hands, and unable to perform) 3 minute walk test: 494 ft with RPE of 2/10 Single leg Stance:  Right- 2.68 sec, Left- 2.73 sec  10/17/2022: 3 minute walk test:  611 ft with RPE of 2-3/10  10/24/2022: 5 times sit to stand:  13.42 sec without UE support with RPE of 2-3/10 30 sec chair stand:  9 complete stands with RPE of 5/10 (unable to complete 10th stand before time expired)  11/02/2022: 3 minute walk test:  582 ft with RPE of 2/10  11/07/2022: 3 minute walk test: 765 ft 5 times sit to stand:  10.94 sec  GAIT: Distance walked: >500 ft Assistive device utilized: None Level of assistance: Complete Independence Comments: Reports that he does get a little bit more winded than he did prior to his Pneumonia   TODAY'S TREATMENT:      DATE: 11/14/2022  Nustep level 6 x7 min with PT present to discuss status Seated hamstring stretch 2x20 sec bilat Sit to/from stand holding 10# kettlebell:  2x5 with chest press, 2x5 with overhead press [employer] holding 15# kettlebell x200 ft in each arm Seated lateral side-bend over peanut ball x10 bilat Standing at barre on foam pad:  hip abduction, hip extension, HS curl.  2x10 each bilat Standing in parallel bars performing single leg stance with occasional UE support as needed 2x30 sec bilat Standing in parallel bars performing tandem stance with occasional UE support as needed 2x30 sec bilat   DATE: 11/09/2022  Nustep level 6 x6 min with PT present to discuss status Sit to/from stand holding 10# kettlebell:  2x5 with  chest press, 2x5 with overhead press [employer] holding 15# kettlebell x200 ft in each arm Seated lateral trunk side-bend 2x10 bilat Standing at barre on foam pad:  hip abduction, hip extension, HS curl.  2x10 each bilat Leg Press (seat at 8) 80# 2x10   DATE: 11/07/2022  Nustep level 5 x4 min with PT present to discuss status 3 minute ambulation:  765 ft Seated hamstring stretch 2x20 sec bilat Seated on chair with foam pad for cushion/elevated surface holding 5# kettlebell sit to stand:  x10 with chest press, x10 with overhead press Standing single leg stance 2x20 sec bilat with UE support as needed Standing tandem stance 2x20 sec each leg leading 2x20 sec bilat Standing in parallel bars on foam pad:  hip abduction, hip extension, HS curl.  2x10 each bilat Seated right lateral side-bend over peanut ball 2x10 Marching on trampoline with unilateral UE support x2 min     PATIENT EDUCATION:  Education details: Issued HEP Person educated: Patient Education method: Explanation,  Demonstration, and Handouts Education comprehension: verbalized understanding and returned demonstration  HOME EXERCISE PROGRAM: Access Code: [email or web address] URL: [email or web address] Date: 11/07/2022 Prepared by: Clydie Braun Jameka Ivie  Exercises - Seated Hamstring Stretch  - 1 x daily - 7 x weekly - 2 reps - 20 sec hold - Sit to Stand  - 1 x daily - 7 x weekly - 2 sets - 5 reps - Standing Hip Abduction with Counter Support  - 1 x daily - 7 x weekly - 2 sets - 10 reps - Standing Hip Extension with Counter Support  - 1 x daily - 7 x weekly - 2 sets - 10 reps - Standing March with Counter Support  - 1 x daily - 7 x weekly - 2 sets - 10 reps - Heel Raises with Counter Support  - 1 x daily - 7 x weekly - 2 sets - 10 reps - Single Leg Stance with Support  - 1 x daily - 7 x weekly - 2 reps - 20 sec hold - Tandem Stance with Support  - 1 x daily - 7 x weekly - 2 reps - 20 sec hold  ASSESSMENT:  CLINICAL  IMPRESSION: Mr Umemoto presents to skilled PT reporting that he saw the Cardiothoracic surgeon and he was scheduled surgery on 12/08/2022 to repair his mitral valve.  Patient states his understanding that he needs to continue his HEP and daily ambulation after discharge to continue to strengthening and prepare for surgical procedure.  Patient able to progress with balance and activity tolerance during session and will have anticipated discharge next visit.     OBJECTIVE IMPAIRMENTS: decreased balance, decreased endurance, difficulty walking, decreased strength, increased muscle spasms, impaired flexibility, postural dysfunction, and pain.   ACTIVITY LIMITATIONS: lifting, bending, and stairs  PARTICIPATION LIMITATIONS: cleaning, community activity, and yard work  PERSONAL FACTORS: Age, Past/current experiences, and 1 comorbidity: recent Pneumonia  are also affecting patient's functional outcome.   REHAB POTENTIAL: Good  CLINICAL DECISION MAKING: Stable/uncomplicated  EVALUATION COMPLEXITY: Low   GOALS: Goals reviewed with patient? Yes  SHORT TERM GOALS: Target date: 10/06/2022 Pt will be independent with initial HEP. Baseline: Goal status: MET  2.  Patient will report ability to walk his dog without getting short of breath. Baseline:  Goal status: MET on 10/17/2022   LONG TERM GOALS: Target date: 11/17/2022  Pt will be independent with advanced HEP. Baseline:  Goal status: Ongoing  2.  Pt will increase 3 minute walk test to greater 700 ft without dyspnea to allow for community ambulation. Baseline: 494 ft Goal status: MET on 11/07/2022  3.  Pt will increase bilat hip strength to at least 4+/5 to allow him to perform sit to stand without UE use and navigate stairs in home with decreased difficulty. Baseline:  Goal status: Ongoing  4.  Pt will increase single leg stance to at least 15 seconds on each side to decrease risk of falling. Baseline: less than 3 seconds on each  side Goal status: Ongoing  5.  Pt will improve 5 times sit to stand to 13 seconds or less without UE use to demonstrate increased functional strength. Baseline: 13.32 sec Goal status: MET on 11/07/2022    PLAN:  PT FREQUENCY: 2x/week  PT DURATION: 8 weeks  PLANNED INTERVENTIONS: Therapeutic exercises, Therapeutic activity, Neuromuscular re-education, Balance training, Gait training, Patient/Family education, Self Care, Joint mobilization, Joint manipulation, Stair training, Aquatic Therapy, Dry Needling, Electrical stimulation, Spinal manipulation, Spinal mobilization, Cryotherapy, Moist heat, Taping, Traction, Ultrasound, Ionotophoresis 4mg /ml Dexamethasone,  Manual therapy, and Re-evaluation  PLAN FOR NEXT SESSION:  progress HEP as indicated, strengthening, balance, preparation for discharge   Reather Laurence, PT, DPT 11/14/22, 10:16 AM   [facility] [address], [address] [place], [place] [address] Phone # [phone] Fax [phone]

## 2022-11-15 ENCOUNTER — Encounter: Payer: Self-pay | Admitting: Internal Medicine

## 2022-11-17 ENCOUNTER — Ambulatory Visit: Payer: Medicare Other | Admitting: Rehabilitative and Restorative Service Providers"

## 2022-11-17 ENCOUNTER — Encounter: Payer: Self-pay | Admitting: Rehabilitative and Restorative Service Providers"

## 2022-11-17 DIAGNOSIS — R5381 Other malaise: Secondary | ICD-10-CM

## 2022-11-17 DIAGNOSIS — R2689 Other abnormalities of gait and mobility: Secondary | ICD-10-CM

## 2022-11-17 DIAGNOSIS — M6281 Muscle weakness (generalized): Secondary | ICD-10-CM | POA: Diagnosis not present

## 2022-11-17 NOTE — Therapy (Signed)
OUTPATIENT PHYSICAL THERAPY TREATMENT NOTE AND DISCHARGE SUMMARY   Patient Name: James Ibarra MRN: [id number] DOB:13-Dec-1950, 72 y.o., male Today's Date: 11/17/2022    END OF SESSION:  PT End of Session - 11/17/22 0935     Visit Number 12    Date for PT Re-Evaluation 11/17/22    Authorization Type [employer] Medicare    PT Start Time 0930    PT Stop Time 1010    PT Time Calculation (min) 40 min    Activity Tolerance Patient tolerated treatment well    Behavior During Therapy [facility] for tasks assessed/performed             Past Medical History:  Diagnosis Date   History of nonmelanoma skin cancer    Hypercholesteremia    Hypertension    Trigeminal neuralgia    Past Surgical History:  Procedure Laterality Date   HERNIA REPAIR  11/07/11   [facility]   INGUINAL HERNIA REPAIR  11/07/2011   Procedure: LAPAROSCOPIC INGUINAL HERNIA;  Surgeon: Atilano Ina, MD,FACS;  Location: [facility] ORS;  Service: General;  Laterality: Left;   RETINAL DETACHMENT SURGERY  1997   RIGHT/LEFT HEART CATH AND CORONARY ANGIOGRAPHY N/A 10/12/2022   Procedure: RIGHT/LEFT HEART CATH AND CORONARY ANGIOGRAPHY;  Surgeon: [name], MD;  Location: [facility] INVASIVE CV LAB;  Service: Cardiovascular;  Laterality: N/A;   SKIN CANCER EXCISION  2012, 1999   basal cell - neck and hand   TEE WITHOUT CARDIOVERSION N/A 10/16/2022   Procedure: TRANSESOPHAGEAL ECHOCARDIOGRAM;  Surgeon: Christell Constant, MD;  Location: [facility] INVASIVE CV LAB;  Service: Cardiovascular;  Laterality: N/A;   Patient Active Problem List   Diagnosis Date Noted   Protein-calorie malnutrition, severe 08/19/2022   Alcohol withdrawal (HCC) 08/14/2022   Acute metabolic encephalopathy 08/14/2022   Paroxysmal atrial fibrillation with RVR (HCC) 08/14/2022   Nonrheumatic mitral valve regurgitation 08/14/2022   Mitral valve prolapse 08/14/2022   PNA (pneumonia) 08/12/2022   Severe sepsis (HCC) 08/12/2022   HLD (hyperlipidemia) 08/12/2022   Hemifacial spasm  11/18/2019   Trigeminal neuralgia of left side of face 07/15/2019   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  Does state that he has been doing his exercises more regularly.   PERTINENT HISTORY: Recent Pneumonia, Squamous Cell Carcinoma, HTN  PAIN:  Are you having pain? Yes: NPRS scale: currently 0/10 Pain location: right hand Pain description: aching Aggravating factors: from injections/IVs Relieving factors: getting up and moving around  PRECAUTIONS: None  RED FLAGS: None   WEIGHT BEARING RESTRICTIONS: No  FALLS:  Has patient fallen in last 6 months? No  LIVING ENVIRONMENT: Lives with: lives with their spouse Lives in: House/apartment Stairs: Yes: Internal: 14 steps; can reach both and External: 8 steps; can reach both Has following equipment at home: shower chair and Grab bars  OCCUPATION: Retired, but worked as a Materials engineer  PLOF: Independent and Leisure: spending time with family, play guitar  PATIENT GOALS: To be steady on  my feet and have more endurance and improve muscle tone.  NEXT MD VISIT:  routine yearly physical with Dr [name] in a couple of months.   Pt reports that he may be having a heart surgery later this year to repair his Mitral Valve  OBJECTIVE:    PATIENT SURVEYS:  Eval:  LEFS 52 / 80 = 65.0 %  COGNITION: Overall cognitive status: Within functional limits for  tasks assessed     SENSATION: Pt reports some numbness and tingling in his feet   MUSCLE LENGTH: Hamstrings: tightness noted bilat  POSTURE: rounded shoulders and forward head  LOWER EXTREMITY ROM:  WFL  LOWER EXTREMITY MMT:  09/20/2022:   Bilateral hip and hamstring strength of 4/5 Bilateral quad strength of 5/5  11/17/2022: BLE strength at least 4+ to 5/5 grossly throughout   FUNCTIONAL TESTS:  Eval: 5 times sit to stand: 16.32 sec with bilat UE (attempted sit to stand without hands, and unable to perform) 3 minute walk test: 494 ft with RPE of 2/10 Single leg Stance:  Right- 2.68 sec, Left- 2.73 sec  10/17/2022: 3 minute walk test:  611 ft with RPE of 2-3/10  10/24/2022: 5 times sit to stand:  13.42 sec without UE support with RPE of 2-3/10 30 sec chair stand:  9 complete stands with RPE of 5/10 (unable to complete 10th stand before time expired)  11/02/2022: 3 minute walk test:  582 ft with RPE of 2/10  11/07/2022: 3 minute walk test: 765 ft 5 times sit to stand:  10.94 sec  11/17/2022: Single leg Stance:  Right- 15 sec, Left 8 sec  GAIT: Distance walked: >500 ft Assistive device utilized: None Level of assistance: Complete Independence Comments: Reports that he does get a little bit more winded than he did prior to his Pneumonia   TODAY'S TREATMENT:      DATE: 11/17/2022  Nustep level 6 x7 min with PT present to discuss status Single leg stance practice at barre Leg Press (seat at 8) 90# 2x10 [employer] holding 20# kettlebell x200 ft in each arm Seated on mat with foam pad for cushion/elevated surface holding 20# kettlebell sit to stand:  2x10 with chest press Forward T with counter support 2x10 bilat Standing hamstring stretch at stairs 2x20 sec bilat Rocker board PF/DF x2 min   DATE: 11/14/2022  Nustep level 6 x7 min with PT present to discuss status Seated hamstring stretch 2x20 sec bilat Sit to/from stand holding 10# kettlebell:  2x5 with  chest press, 2x5 with overhead press [employer] holding 15# kettlebell x200 ft in each arm Seated lateral side-bend over peanut ball x10 bilat Standing at barre on foam pad:  hip abduction, hip extension, HS curl.  2x10 each bilat Standing in parallel bars performing single leg stance with occasional UE support as needed 2x30 sec bilat Standing in parallel bars performing tandem stance with occasional UE support as needed 2x30 sec bilat   DATE: 11/09/2022  Nustep level 6 x6 min with PT present to discuss status Sit to/from stand holding 10# kettlebell:  2x5 with chest press, 2x5 with overhead press [employer] holding 15# kettlebell x200 ft in each arm Seated lateral trunk side-bend 2x10 bilat Standing at barre on foam pad:  hip abduction, hip extension, HS curl.  2x10 each bilat Leg Press (seat at 8) 80# 2x10    PATIENT EDUCATION:  Education details: Issued HEP Person educated: Patient Education method: Explanation, Demonstration, and Handouts Education comprehension: verbalized understanding and returned demonstration  HOME EXERCISE PROGRAM: Access Code: [email or web address] URL: [email or web address] Date: 11/17/2022 Prepared by: Clydie Braun Demetrio Leighty  Exercises - Seated Hamstring Stretch  - 1 x daily - 7 x weekly - 2 reps - 20 sec hold - Sit to Stand  - 1 x daily - 7 x weekly - 2 sets - 5 reps - Standing Hip Abduction with Counter Support  - 1 x daily - 7 x weekly - 2 sets - 10 reps - Standing Hip Extension with Counter Support  - 1 x daily - 7 x weekly - 2 sets - 10 reps - Standing March with Counter Support  - 1 x daily - 7 x weekly - 2 sets - 10 reps - Heel Raises with Counter Support  - 1 x daily - 7 x weekly - 2 sets - 10 reps - Single Leg Stance with Support  - 1 x daily - 7 x weekly - 2 reps - 20 sec hold - Tandem Stance with Support  - 1 x daily - 7 x weekly - 2 reps - 20 sec hold - Forward T with Counter Support  - 1 x daily - 7 x weekly - 2 sets - 10  reps  ASSESSMENT:  CLINICAL IMPRESSION: James Ibarra presents to skilled PT reporting improved compliance with HEP and walking his dog.  Patient verbalizes understanding of the importance of continuing compliance with HEP, especially leading up to his planned cardiac surgery.  Patient able to progress standing balance and hip stability exercise with forward T, but did require counter support.  Added to HEP and provided pt with a flow sheet to assist with compliance at home.  Patient has met all goals at this time and demonstrates improved strength and balance.  Patient able to tolerate increased ambulation and demonstrates a lower fall risk.  Patient discharged from PT at this time to continue with HEP and daily ambulation.   OBJECTIVE IMPAIRMENTS: decreased balance, decreased endurance, difficulty walking, decreased strength, increased muscle spasms, impaired flexibility, postural dysfunction, and pain.   ACTIVITY LIMITATIONS: lifting, bending, and stairs  PARTICIPATION LIMITATIONS: cleaning, community activity, and yard work  PERSONAL FACTORS: Age, Past/current experiences, and 1 comorbidity: recent Pneumonia  are also affecting patient's functional outcome.   REHAB POTENTIAL: Good  CLINICAL DECISION MAKING: Stable/uncomplicated  EVALUATION COMPLEXITY: Low   GOALS: Goals reviewed with patient? Yes  SHORT TERM GOALS: Target date: 10/06/2022 Pt will be independent with initial HEP. Baseline: Goal status: MET  2.  Patient will report ability to walk his dog without getting short of breath. Baseline:  Goal status: MET on 10/17/2022   LONG TERM GOALS: Target date: 11/17/2022  Pt will be independent with advanced HEP. Baseline:  Goal status: MET  2.  Pt will increase 3 minute walk test to greater 700 ft without dyspnea to allow for community ambulation. Baseline: 494 ft Goal status: MET on 11/07/2022  3.  Pt will increase bilat hip strength to at least 4+/5 to allow him to perform sit  to stand without UE use and navigate stairs in home with decreased difficulty. Baseline:  Goal status: MET  4.  Pt will increase single leg stance to at least 15 seconds on each side to decrease risk of falling. Baseline: less than 3 seconds on each side Goal status: MET on right LE  5.  Pt will improve 5 times sit to stand to 13 seconds or less without UE use to demonstrate increased functional strength. Baseline: 13.32 sec Goal status: MET on 11/07/2022  PLAN:  PT FREQUENCY: 2x/week  PT DURATION: 8 weeks  PLANNED INTERVENTIONS: Therapeutic exercises, Therapeutic activity, Neuromuscular re-education, Balance training, Gait training, Patient/Family education, Self Care, Joint mobilization, Joint manipulation, Stair training, Aquatic Therapy, Dry Needling, Electrical stimulation, Spinal manipulation, Spinal mobilization, Cryotherapy, Moist heat, Taping, Traction, Ultrasound, Ionotophoresis 4mg /ml Dexamethasone, Manual therapy, and Re-evaluation     PHYSICAL THERAPY DISCHARGE SUMMARY  Patient agrees to discharge. Patient goals were met. Patient is being discharged due to meeting the stated rehab goals.    Reather Laurence, PT, DPT 11/17/22, 9:36 AM   [facility] [address] [place] , [place] [address] Phone # [phone] Fax [phone]

## 2022-12-04 DIAGNOSIS — I4891 Unspecified atrial fibrillation: Secondary | ICD-10-CM | POA: Diagnosis not present

## 2022-12-04 DIAGNOSIS — I10 Essential (primary) hypertension: Secondary | ICD-10-CM | POA: Diagnosis not present

## 2022-12-04 DIAGNOSIS — L309 Dermatitis, unspecified: Secondary | ICD-10-CM | POA: Diagnosis not present

## 2022-12-04 DIAGNOSIS — I480 Paroxysmal atrial fibrillation: Secondary | ICD-10-CM | POA: Diagnosis not present

## 2022-12-04 DIAGNOSIS — I341 Nonrheumatic mitral (valve) prolapse: Secondary | ICD-10-CM | POA: Diagnosis not present

## 2022-12-04 DIAGNOSIS — Z23 Encounter for immunization: Secondary | ICD-10-CM | POA: Diagnosis not present

## 2022-12-04 DIAGNOSIS — E785 Hyperlipidemia, unspecified: Secondary | ICD-10-CM | POA: Diagnosis not present

## 2022-12-05 NOTE — Pre-Procedure Instructions (Signed)
Surgical Instructions   Your procedure is scheduled on December 08, 2022. Report to [facility] Main Entrance "A" at 5:30 A.M., then check in with the Admitting office. Any questions or running late day of surgery: call [phone]  Questions prior to your surgery date: call [phone], Monday-Friday, 8am-4pm. If you experience any cold or flu symptoms such as cough, fever, chills, shortness of breath, etc. between now and your scheduled surgery, please notify us at the above number.     Remember:  Do not eat or drink after midnight the night before your surgery    Take these medicines the morning of surgery with A SIP OF WATER: gabapentin (NEURONTIN)  metoprolol tartrate (LOPRESSOR)    May take these medicines IF NEEDED: Polyethyl Glycol-Propyl Glycol (SYSTANE OP) eye drops   STOP taking your apixaban (ELIQUIS) five days prior to surgery. Your last dose will be September 28th.   One week prior to surgery, STOP taking any Aspirin (unless otherwise instructed by your surgeon) Aleve, Naproxen, Ibuprofen, Motrin, Advil, Goody's, BC's, all herbal medications, fish oil, and non-prescription vitamins.                     Do NOT Smoke (Tobacco/Vaping) for 24 hours prior to your procedure.  If you use a CPAP at night, you may bring your mask/headgear for your overnight stay.   You will be asked to remove any contacts, glasses, piercing's, hearing aid's, dentures/partials prior to surgery. Please bring cases for these items if needed.    Patients discharged the day of surgery will not be allowed to drive home, and someone needs to stay with them for 24 hours.  SURGICAL WAITING ROOM VISITATION Patients may have no more than 2 support people in the waiting area - these visitors may rotate.   Pre-op nurse will coordinate an appropriate time for 1 ADULT support person, who may not rotate, to accompany patient in pre-op.  Children under the age of 24 must have an adult with them who is not  the patient and must remain in the main waiting area with an adult.  If the patient needs to stay at the hospital during part of their recovery, the visitor guidelines for inpatient rooms apply.  Please refer to the [facility] website for the visitor guidelines for any additional information.   If you received a COVID test during your pre-op visit  it is requested that you wear a mask when out in public, stay away from anyone that may not be feeling well and notify your surgeon if you develop symptoms. If you have been in contact with anyone that has tested positive in the last 10 days please notify you surgeon.      Pre-operative CHG Bathing Instructions   You can play a key role in reducing the risk of infection after surgery. Your skin needs to be as free of germs as possible. You can reduce the number of germs on your skin by washing with CHG (chlorhexidine gluconate) soap before surgery. CHG is an antiseptic soap that kills germs and continues to kill germs even after washing.   DO NOT use if you have an allergy to chlorhexidine/CHG or antibacterial soaps. If your skin becomes reddened or irritated, stop using the CHG and notify one of our RNs at [phone].              TAKE A SHOWER THE NIGHT BEFORE SURGERY AND THE DAY OF SURGERY    Please keep in  mind the following:  DO NOT shave, including legs and underarms, 48 hours prior to surgery.   You may shave your face before/day of surgery.  Place clean sheets on your bed the night before surgery Use a clean washcloth (not used since being washed) for each shower. DO NOT sleep with pet's night before surgery.  CHG Shower Instructions:  Wash your face and private area with normal soap. If you choose to wash your hair, wash first with your normal shampoo.  After you use shampoo/soap, rinse your hair and body thoroughly to remove shampoo/soap residue.  Turn the water OFF and apply half the bottle of CHG soap to a CLEAN washcloth.   Apply CHG soap ONLY FROM YOUR NECK DOWN TO YOUR TOES (washing for 3-5 minutes)  DO NOT use CHG soap on face, private areas, open wounds, or sores.  Pay special attention to the area where your surgery is being performed.  If you are having back surgery, having someone wash your back for you may be helpful. Wait 2 minutes after CHG soap is applied, then you may rinse off the CHG soap.  Pat dry with a clean towel  Put on clean pajamas    Additional instructions for the day of surgery: DO NOT APPLY any lotions, deodorants, cologne, or perfumes.   Do not wear jewelry or makeup Do not wear nail polish, gel polish, artificial nails, or any other type of covering on natural nails (fingers and toes) Do not bring valuables to the hospital. [facility] is not responsible for valuables/personal belongings. Put on clean/comfortable clothes.  Please brush your teeth.  Ask your nurse before applying any prescription medications to the skin.

## 2022-12-06 ENCOUNTER — Ambulatory Visit (HOSPITAL_COMMUNITY)
Admission: RE | Admit: 2022-12-06 | Discharge: 2022-12-06 | Disposition: A | Discharge status: Home / Self Care | Payer: Medicare Other | Source: Ambulatory Visit | Attending: Thoracic Surgery (Cardiothoracic Vascular Surgery) | Admitting: Thoracic Surgery (Cardiothoracic Vascular Surgery)

## 2022-12-06 ENCOUNTER — Ambulatory Visit (HOSPITAL_BASED_OUTPATIENT_CLINIC_OR_DEPARTMENT_OTHER)
Admission: RE | Admit: 2022-12-06 | Discharge: 2022-12-06 | Disposition: A | Discharge status: Home / Self Care | Payer: Medicare Other | Source: Ambulatory Visit | Attending: Thoracic Surgery (Cardiothoracic Vascular Surgery) | Admitting: Thoracic Surgery (Cardiothoracic Vascular Surgery)

## 2022-12-06 ENCOUNTER — Encounter (HOSPITAL_COMMUNITY)
Admission: RE | Admit: 2022-12-06 | Discharge: 2022-12-06 | Disposition: A | Discharge status: Home / Self Care | Payer: Medicare Other | Source: Ambulatory Visit | Attending: Thoracic Surgery (Cardiothoracic Vascular Surgery) | Admitting: Thoracic Surgery (Cardiothoracic Vascular Surgery)

## 2022-12-06 ENCOUNTER — Encounter (HOSPITAL_COMMUNITY): Payer: Self-pay

## 2022-12-06 ENCOUNTER — Other Ambulatory Visit: Payer: Self-pay

## 2022-12-06 VITALS — BP 153/73 | HR 48 | Temp 98.4°F | Resp 18 | Ht 73.0 in | Wt 156.8 lb

## 2022-12-06 DIAGNOSIS — I3481 Nonrheumatic mitral (valve) annulus calcification: Secondary | ICD-10-CM | POA: Diagnosis not present

## 2022-12-06 DIAGNOSIS — I517 Cardiomegaly: Secondary | ICD-10-CM | POA: Insufficient documentation

## 2022-12-06 DIAGNOSIS — Z01818 Encounter for other preprocedural examination: Secondary | ICD-10-CM

## 2022-12-06 DIAGNOSIS — I340 Nonrheumatic mitral (valve) insufficiency: Secondary | ICD-10-CM

## 2022-12-06 DIAGNOSIS — I480 Paroxysmal atrial fibrillation: Secondary | ICD-10-CM

## 2022-12-06 DIAGNOSIS — Z1152 Encounter for screening for COVID-19: Secondary | ICD-10-CM | POA: Insufficient documentation

## 2022-12-06 DIAGNOSIS — Z01812 Encounter for preprocedural laboratory examination: Secondary | ICD-10-CM | POA: Insufficient documentation

## 2022-12-06 DIAGNOSIS — I700 Atherosclerosis of aorta: Secondary | ICD-10-CM | POA: Insufficient documentation

## 2022-12-06 DIAGNOSIS — E8770 Fluid overload, unspecified: Secondary | ICD-10-CM | POA: Diagnosis not present

## 2022-12-06 DIAGNOSIS — Z9841 Cataract extraction status, right eye: Secondary | ICD-10-CM | POA: Diagnosis not present

## 2022-12-06 DIAGNOSIS — Z01810 Encounter for preprocedural cardiovascular examination: Secondary | ICD-10-CM | POA: Insufficient documentation

## 2022-12-06 DIAGNOSIS — Z85828 Personal history of other malignant neoplasm of skin: Secondary | ICD-10-CM | POA: Diagnosis not present

## 2022-12-06 DIAGNOSIS — D62 Acute posthemorrhagic anemia: Secondary | ICD-10-CM | POA: Diagnosis not present

## 2022-12-06 DIAGNOSIS — I4891 Unspecified atrial fibrillation: Secondary | ICD-10-CM | POA: Diagnosis not present

## 2022-12-06 DIAGNOSIS — Z01811 Encounter for preprocedural respiratory examination: Secondary | ICD-10-CM | POA: Insufficient documentation

## 2022-12-06 DIAGNOSIS — R918 Other nonspecific abnormal finding of lung field: Secondary | ICD-10-CM | POA: Insufficient documentation

## 2022-12-06 DIAGNOSIS — R451 Restlessness and agitation: Secondary | ICD-10-CM | POA: Diagnosis not present

## 2022-12-06 DIAGNOSIS — Q2112 Patent foramen ovale: Secondary | ICD-10-CM | POA: Diagnosis not present

## 2022-12-06 DIAGNOSIS — R590 Localized enlarged lymph nodes: Secondary | ICD-10-CM | POA: Diagnosis not present

## 2022-12-06 DIAGNOSIS — D696 Thrombocytopenia, unspecified: Secondary | ICD-10-CM | POA: Diagnosis not present

## 2022-12-06 DIAGNOSIS — E7800 Pure hypercholesterolemia, unspecified: Secondary | ICD-10-CM | POA: Diagnosis not present

## 2022-12-06 DIAGNOSIS — Z79899 Other long term (current) drug therapy: Secondary | ICD-10-CM | POA: Insufficient documentation

## 2022-12-06 DIAGNOSIS — I97190 Other postprocedural cardiac functional disturbances following cardiac surgery: Secondary | ICD-10-CM | POA: Diagnosis not present

## 2022-12-06 DIAGNOSIS — I341 Nonrheumatic mitral (valve) prolapse: Secondary | ICD-10-CM | POA: Diagnosis not present

## 2022-12-06 DIAGNOSIS — I119 Hypertensive heart disease without heart failure: Secondary | ICD-10-CM | POA: Diagnosis not present

## 2022-12-06 DIAGNOSIS — Z961 Presence of intraocular lens: Secondary | ICD-10-CM | POA: Diagnosis not present

## 2022-12-06 DIAGNOSIS — Z7901 Long term (current) use of anticoagulants: Secondary | ICD-10-CM | POA: Diagnosis not present

## 2022-12-06 DIAGNOSIS — J90 Pleural effusion, not elsewhere classified: Secondary | ICD-10-CM | POA: Diagnosis not present

## 2022-12-06 DIAGNOSIS — F05 Delirium due to known physiological condition: Secondary | ICD-10-CM | POA: Diagnosis not present

## 2022-12-06 DIAGNOSIS — J984 Other disorders of lung: Secondary | ICD-10-CM | POA: Diagnosis not present

## 2022-12-06 DIAGNOSIS — G500 Trigeminal neuralgia: Secondary | ICD-10-CM | POA: Diagnosis not present

## 2022-12-06 HISTORY — DX: Cardiac murmur, unspecified: R01.1

## 2022-12-06 HISTORY — DX: Cardiac arrhythmia, unspecified: I49.9

## 2022-12-06 HISTORY — DX: Malignant (primary) neoplasm, unspecified: C80.1

## 2022-12-06 LAB — BLOOD GAS, ARTERIAL
Acid-Base Excess: 1 mmol/L (ref 0.0–2.0)
Bicarbonate: 26.6 mmol/L (ref 20.0–28.0)
Drawn by: 58793
O2 Saturation: 99 %
Patient temperature: 37
pCO2 arterial: 45 mm[Hg] (ref 32–48)
pH, Arterial: 7.38 (ref 7.35–7.45)
pO2, Arterial: 100 mm[Hg] (ref 83–108)

## 2022-12-06 LAB — TYPE AND SCREEN
ABO/RH(D): A NEG
Antibody Screen: NEGATIVE

## 2022-12-06 LAB — URINALYSIS, ROUTINE W REFLEX MICROSCOPIC
Bilirubin Urine: NEGATIVE
Glucose, UA: NEGATIVE mg/dL
Hgb urine dipstick: NEGATIVE
Ketones, ur: NEGATIVE mg/dL
Leukocytes,Ua: NEGATIVE
Nitrite: NEGATIVE
Protein, ur: NEGATIVE mg/dL
Specific Gravity, Urine: 1.017 (ref 1.005–1.030)
pH: 6 (ref 5.0–8.0)

## 2022-12-06 LAB — CBC
HCT: 40.8 % (ref 39.0–52.0)
Hemoglobin: 13.3 g/dL (ref 13.0–17.0)
MCH: 29.2 pg (ref 26.0–34.0)
MCHC: 32.6 g/dL (ref 30.0–36.0)
MCV: 89.7 fL (ref 80.0–100.0)
Platelets: 136 10*3/uL — ABNORMAL LOW (ref 150–400)
RBC: 4.55 MIL/uL (ref 4.22–5.81)
RDW: 13.6 % (ref 11.5–15.5)
WBC: 6 10*3/uL (ref 4.0–10.5)
nRBC: 0 % (ref 0.0–0.2)

## 2022-12-06 LAB — COMPREHENSIVE METABOLIC PANEL
ALT: 20 U/L (ref 0–44)
AST: 21 U/L (ref 15–41)
Albumin: 3.6 g/dL (ref 3.5–5.0)
Alkaline Phosphatase: 49 U/L (ref 38–126)
Anion gap: 10 (ref 5–15)
BUN: 14 mg/dL (ref 8–23)
CO2: 21 mmol/L — ABNORMAL LOW (ref 22–32)
Calcium: 8.6 mg/dL — ABNORMAL LOW (ref 8.9–10.3)
Chloride: 107 mmol/L (ref 98–111)
Creatinine, Ser: 0.64 mg/dL (ref 0.61–1.24)
GFR, Estimated: >60 mL/min (ref >60)
Glucose, Bld: 94 mg/dL (ref 70–99)
Potassium: 3.7 mmol/L (ref 3.5–5.1)
Sodium: 138 mmol/L (ref 135–145)
Total Bilirubin: 0.6 mg/dL (ref 0.3–1.2)
Total Protein: 6.1 g/dL — ABNORMAL LOW (ref 6.5–8.1)

## 2022-12-06 LAB — HEMOGLOBIN A1C
Hgb A1c MFr Bld: 5.1 % (ref 4.8–5.6)
Mean Plasma Glucose: 99.67 mg/dL

## 2022-12-06 LAB — PROTIME-INR
INR: 1 (ref 0.8–1.2)
Prothrombin Time: 13.3 s (ref 11.4–15.2)

## 2022-12-06 LAB — SURGICAL PCR SCREEN
MRSA, PCR: NEGATIVE
Staphylococcus aureus: NEGATIVE

## 2022-12-06 LAB — APTT: aPTT: 31 s (ref 24–36)

## 2022-12-06 LAB — SARS CORONAVIRUS 2 (TAT 6-24 HRS): SARS Coronavirus 2: NEGATIVE

## 2022-12-06 NOTE — Progress Notes (Signed)
PCP - Dr. Farris Has Cardiologist - Dr. Alverda Skeans (Last office visit 10/04/2022)  PPM/ICD - Denies Device Orders - n/a Rep Notified - n/a  Chest x-ray - 12/06/2022 EKG - 12/06/2022 Stress Test - Denies ECHO - 10/16/2022 Cardiac Cath - 10/12/2022  Sleep Study - Denies CPAP - n/a  No DM  Last dose of GLP1 agonist- n/a   GLP1 instructions: n/a  Blood Thinner Instructions: Per surgeon, hold Eliquis 5 days. Last dose was September 28th. Aspirin Instructions: n/a  NPO after midnight    COVID TEST- Yes. Result pending.   Anesthesia review: Yes. Recent hospitalization for PNA 6/8 to 6/21. Pt also went into a.fib during this hospitalization.   Patient denies shortness of breath, fever, cough and chest pain at PAT appointment. Pt denies any respiratory illness/infection in the last two months.    All instructions explained to the patient, with a verbal understanding of the material. Patient agrees to go over the instructions while at home for a better understanding. Patient also instructed to self quarantine after being tested for COVID-19. The opportunity to ask questions was provided.

## 2022-12-07 MED ORDER — CEFAZOLIN SODIUM-DEXTROSE 2-4 GM/100ML-% IV SOLN
2.0000 g | INTRAVENOUS | Status: DC
  Filled 2022-12-07: qty 100

## 2022-12-07 MED ORDER — MANNITOL 20 % IV SOLN
INTRAVENOUS | Status: DC
  Filled 2022-12-07: qty 13

## 2022-12-07 MED ORDER — VANCOMYCIN HCL 1250 MG/250ML IV SOLN
1250.0000 mg | INTRAVENOUS | Status: AC
  Administered 2022-12-08: 1250 mg via INTRAVENOUS
  Filled 2022-12-07: qty 250

## 2022-12-07 MED ORDER — DEXMEDETOMIDINE HCL IN NACL 400 MCG/100ML IV SOLN
0.1000 ug/kg/h | INTRAVENOUS | Status: AC
  Administered 2022-12-08: .7 ug/kg/h via INTRAVENOUS
  Filled 2022-12-07: qty 100

## 2022-12-07 MED ORDER — POTASSIUM CHLORIDE 2 MEQ/ML IV SOLN
80.0000 meq | INTRAVENOUS | Status: DC
  Filled 2022-12-07: qty 40

## 2022-12-07 MED ORDER — PHENYLEPHRINE HCL-NACL 20-0.9 MG/250ML-% IV SOLN
30.0000 ug/min | INTRAVENOUS | Status: DC
  Filled 2022-12-07: qty 250

## 2022-12-07 MED ORDER — TRANEXAMIC ACID (OHS) BOLUS VIA INFUSION
15.0000 mg/kg | INTRAVENOUS | Status: AC
  Administered 2022-12-08: 711 mg via INTRAVENOUS
  Filled 2022-12-07: qty 1067

## 2022-12-07 MED ORDER — CEFAZOLIN SODIUM-DEXTROSE 2-4 GM/100ML-% IV SOLN
2.0000 g | INTRAVENOUS | Status: AC
  Administered 2022-12-08 (×2): 2 g via INTRAVENOUS
  Filled 2022-12-07: qty 100

## 2022-12-07 MED ORDER — NOREPINEPHRINE 4 MG/250ML-% IV SOLN
0.0000 ug/min | INTRAVENOUS | Status: DC
  Filled 2022-12-07: qty 250

## 2022-12-07 MED ORDER — HEPARIN 30,000 UNITS/1000 ML (OHS) CELLSAVER SOLUTION
Status: DC
  Filled 2022-12-07: qty 1000

## 2022-12-07 MED ORDER — TRANEXAMIC ACID (OHS) PUMP PRIME SOLUTION
2.0000 mg/kg | INTRAVENOUS | Status: DC
  Filled 2022-12-07: qty 1.42

## 2022-12-07 MED ORDER — VANCOMYCIN HCL 1000 MG IV SOLR
INTRAVENOUS | Status: DC
  Filled 2022-12-07: qty 20

## 2022-12-07 MED ORDER — PLASMA-LYTE A IV SOLN
INTRAVENOUS | Status: DC
  Filled 2022-12-07: qty 2.5

## 2022-12-07 MED ORDER — NITROGLYCERIN IN D5W 200-5 MCG/ML-% IV SOLN
2.0000 ug/min | INTRAVENOUS | Status: DC
  Filled 2022-12-07: qty 250

## 2022-12-07 MED ORDER — EPINEPHRINE HCL 5 MG/250ML IV SOLN IN NS
0.0000 ug/min | INTRAVENOUS | Status: DC
  Filled 2022-12-07: qty 250

## 2022-12-07 MED ORDER — TRANEXAMIC ACID 1000 MG/10ML IV SOLN
1.5000 mg/kg/h | INTRAVENOUS | Status: AC
  Administered 2022-12-08: 1.5 mg/kg/h via INTRAVENOUS
  Filled 2022-12-07: qty 25

## 2022-12-07 MED ORDER — INSULIN REGULAR(HUMAN) IN NACL 100-0.9 UT/100ML-% IV SOLN
INTRAVENOUS | Status: DC
  Filled 2022-12-07: qty 100

## 2022-12-07 MED ORDER — MILRINONE LACTATE IN DEXTROSE 20-5 MG/100ML-% IV SOLN
0.3000 ug/kg/min | INTRAVENOUS | Status: DC
  Filled 2022-12-07: qty 100

## 2022-12-07 NOTE — H&P (Signed)
[address]       [place] [address]             [phone]                                   James Ibarra [facility] Medical Record #[id number] Date of Birth: [birth year]   James Has, MD James Has, MD   Chief Complaint:  severe MR and atrial fibrillation    History of Present Illness:     Pt is a very pleasant 72 yo male who was found to have a heart murmur by his PCP this spring and underwent TTE which showed severe MR and preserved LV function. Pt was set up to see cardiology however developed acute SOB and was admitted with a pneumonia and atrial fibrillation. Pt was controlled medically and improved and discharged on NOAC. 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 Pt had a cath with no PHTN but elevated LVEDP. He had no CAD. Pt had a holter monitor without afib. Pt continues to feel palpitations especially lying on left side and is DOE.              Past Medical History:  Diagnosis Date   History of nonmelanoma skin cancer     Hypercholesteremia     Hypertension     Trigeminal neuralgia                 Past Surgical History:  Procedure Laterality Date   HERNIA REPAIR   11/07/11    [facility]   INGUINAL HERNIA REPAIR   11/07/2011    Procedure: LAPAROSCOPIC INGUINAL HERNIA;  Surgeon: Atilano Ina, MD,FACS;  Location: [facility];  Service: General;  Laterality: Left;   RETINAL DETACHMENT SURGERY   1997   RIGHT/LEFT HEART CATH AND CORONARY ANGIOGRAPHY N/A 10/12/2022    Procedure: RIGHT/LEFT HEART CATH AND CORONARY ANGIOGRAPHY;  Surgeon: [name], MD;  Location: [facility];  Service: Cardiovascular;  Laterality: N/A;   SKIN CANCER EXCISION   2012, 1999    basal cell - neck and hand   TEE WITHOUT CARDIOVERSION N/A 10/16/2022     Procedure: TRANSESOPHAGEAL ECHOCARDIOGRAM;  Surgeon: Christell Constant, MD;  Location: [facility];  Service: Cardiovascular;  Laterality: N/A;          Tobacco Use History  Social History       Tobacco Use  Smoking Status Never  Smokeless Tobacco Never      Social History        Substance and Sexual Activity  Alcohol Use Yes    Comment: 3 glasses of wine a day      Social History         Socioeconomic History   Marital status: Married      Spouse name: Not on file   Number of children: Not on file   Years of education: Not on file   Highest education level: Not on file  Occupational History   Not on file  Tobacco Use   Smoking status: Never   Smokeless tobacco: Never  Substance and Sexual Activity   Alcohol use: Yes  Comment: 3 glasses of wine a day   Drug use: No   Sexual activity: Not on file  Other Topics Concern   Not on file  Social History Narrative   Not on file    Social Determinants of Health        Financial Resource Strain: Not on file  Food Insecurity: No Food Insecurity (08/12/2022)    Hunger Vital Sign     Worried About Running Out of Food in the Last Year: Never true     Ran Out of Food in the Last Year: Never true  Transportation Needs: No Transportation Needs (08/12/2022)    PRAPARE - Therapist, art (Medical): No     Lack of Transportation (Non-Medical): No  Physical Activity: Not on file  Stress: Not on file  Social Connections: Not on file  Intimate Partner Violence: Not At Risk (08/12/2022)    Humiliation, Afraid, Rape, and Kick questionnaire     Fear of Current or Ex-Partner: No     Emotionally Abused: No     Physically Abused: No     Sexually Abused: No      Allergies  No Known Allergies           Current Outpatient Medications  Medication Sig Dispense Refill   albuterol (VENTOLIN HFA) 108 (90 Base) MCG/ACT inhaler Inhale 1-2 puffs into the lungs every 6 (six) hours as needed for  wheezing or shortness of breath.       apixaban (ELIQUIS) 5 MG TABS tablet Take 1 tablet (5 mg total) by mouth 2 (two) times daily. 60 tablet     folic acid (FOLVITE) 1 MG tablet Take 1 tablet (1 mg total) by mouth daily.       gabapentin (NEURONTIN) 300 MG capsule TAKE 2 CAPSULES BY MOUTH 4 TIMES DAILY 480 capsule 5   hydrocortisone 2.5 % ointment Apply 1 Application topically 2 (two) times daily as needed (itching).       ketoconazole (NIZORAL) 2 % cream Apply 1 Application topically as needed for irritation.       melatonin 3 MG TABS tablet Take 3 mg by mouth at bedtime.       metoprolol tartrate (LOPRESSOR) 25 MG tablet Take 1 tablet (25 mg total) by mouth 2 (two) times daily.       Multiple Vitamins-Minerals (PRESERVISION AREDS 2) CAPS Take 1 capsule by mouth 2 (two) times daily.       olmesartan (BENICAR) 20 MG tablet Take 20 mg by mouth daily.       Polyethyl Glycol-Propyl Glycol (SYSTANE OP) Place 1 drop into both eyes daily.       pravastatin (PRAVACHOL) 10 MG tablet Take 10 mg by mouth at bedtime.       sildenafil (REVATIO) 20 MG tablet Take 40-60 mg by mouth daily as needed (ED).       Thiamine HCl (VITAMIN B-1) 250 MG tablet Take 250 mg by mouth daily.       traZODone (DESYREL) 100 MG tablet Take 100-150 mg by mouth at bedtime.          No current facility-administered medications for this visit.               Family History  Problem Relation Age of Onset   Heart disease Father     Heart disease Mother                  Physical Exam: Lungs: clear  Card: irreg with 3/6 sem Ext: warm no edema Neuro: intact Teeth in good repair         Diagnostic Studies & Laboratory data: I have personally reviewed the following studies and agree with the findings   Cath (10/2022) Conclusion   1.  Very minimal luminal abnormalities of LAD with otherwise normal right dominant circulation. 2.  Fick cardiac output of 4.9 L/min and Fick cardiac index of 2.6 L/min/m with the  following hemodynamics:             Right atrial pressure mean of 9 mmHg             Right ventricular pressure 36/5 with an end-diastolic pressure of 18 mmHg             Mean wedge pressure of 18 mmHg with V waves to 25 mmHg             PA pressure of 38/16 with a mean of 23 mmHg             PVR of less than 3 Woods units 3.  LVEDP of 26 mmHg.   TEE(10/2022) IMPRESSIONS     1. 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  Systolic blunting of the pulmonary veins. 3D Vena contracta 0.387 cm2.  Rotated cardiac silhouette. The mitral valve is myxomatous. Moderate to  severe mitral valve regurgitation.   2. Left ventricular ejection fraction, by estimation, is 60%. The left  ventricle Ibarra normal function.   3. Right ventricular systolic function is normal. The right ventricular  size is normal.   4. Left atrial size was mildly dilated. No left atrial/left atrial  appendage thrombus was detected.   5. Right atrial size was mildly dilated.   6. The aortic valve is tricuspid. Aortic valve regurgitation is not  visualized. No aortic stenosis is present.   7. There is mild (Grade II) plaque involving the descending aorta.   8. Small PFO.   FINDINGS   Left Ventricle: Left ventricular ejection fraction, by estimation, is  60%. The left ventricle Ibarra normal function. The left ventricular internal  cavity size was normal in size.   Right Ventricle: The right ventricular size is normal. No increase in  right ventricular wall thickness. Right ventricular systolic function is  normal.   Left Atrium: Left atrial size was mildly dilated. No left atrial/left  atrial appendage thrombus was detected.   Right Atrium: Right atrial size was mildly dilated.   Pericardium: There is no evidence of pericardial effusion.   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 Systolic blunting of the pulmonary veins. 3D Vena contracta  0.387 cm2.  Rotated cardiac silhouette. The mitral valve is myxomatous.  Moderate to severe mitral valve  regurgitation.   Tricuspid Valve: The tricuspid valve is normal in structure. Tricuspid  valve regurgitation is mild.   Aortic Valve: The aortic valve is tricuspid. Aortic valve regurgitation is  not visualized. No aortic stenosis is present.   Pulmonic Valve: The pulmonic valve was normal in structure. Pulmonic valve  regurgitation is mild to moderate. No evidence of pulmonic stenosis.   Aorta: The aortic root, ascending aorta, aortic arch and descending aorta  are all structurally normal, with no evidence of dilitation or  obstruction. There is mild (Grade II) plaque involving the descending  aorta.   IAS/Shunts: Small PFO.   Additional Comments: Spectral Doppler performed.     3D Volume EF  LV 3D EDV:  81.18 ml  LV 3D ESV:   35.83 ml   Recent Radiology Findings:        Recent Lab Findings: Recent Labs       Lab Results  Component Value Date    WBC 4.6 10/04/2022    HGB 11.9 (L) 10/12/2022    HCT 35.0 (L) 10/12/2022    PLT 166 10/04/2022    GLUCOSE 94 10/04/2022    CHOL 156 10/04/2022    TRIG 106 10/04/2022    HDL 54 10/04/2022    LDLCALC 83 10/04/2022    ALT 16 10/04/2022    AST 19 10/04/2022    NA 141 10/12/2022    K 3.4 (L) 10/12/2022    CL 105 10/04/2022    CREATININE 0.76 10/04/2022    BUN 12 10/04/2022    CO2 27 10/04/2022    INR 1.3 (H) 08/13/2022            Assessment / Plan:     72 yo male with NYHA class 2 symptoms of severe MR from posterior prolapse into the posterior commisure. Pt would benefit from surgery which would include maze procedure and clipping of LA appendage. I feel that with complex nature of repair would best be done by sternotomy. All the risks and goals and recovery of surgery were discussed and pt understands and wishes to proceed. Will need off eliquis for 5 days and will obtain routine preoperative testing.

## 2022-12-07 NOTE — Anesthesia Preprocedure Evaluation (Addendum)
Anesthesia Evaluation  Patient identified by MRN, date of birth, ID band Patient awake    Reviewed: Allergy & Precautions, H&P , NPO status , Patient's Chart, lab work & pertinent test results  Airway Mallampati: II  TM Distance: >3 FB Neck ROM: Full    Dental no notable dental hx.    Pulmonary pneumonia   Pulmonary exam normal breath sounds clear to auscultation       Cardiovascular hypertension, Normal cardiovascular exam+ dysrhythmias Atrial Fibrillation + Valvular Problems/Murmurs MVP and MR  Rhythm:Regular Rate:Normal  HLD   TEE IMPRESSIONS     1. 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  Systolic blunting of the pulmonary veins. 3D Vena contracta 0.387 cm2.  Rotated cardiac silhouette. The mitral valve is myxomatous. Moderate to  severe mitral valve regurgitation.   2. Left ventricular ejection fraction, by estimation, is 60%. The left  ventricle has normal function.   3. Right ventricular systolic function is normal. The right ventricular  size is normal.   4. Left atrial size was mildly dilated. No left atrial/left atrial  appendage thrombus was detected.   5. Right atrial size was mildly dilated.   6. The aortic valve is tricuspid. Aortic valve regurgitation is not  visualized. No aortic stenosis is present.   7. There is mild (Grade II) plaque involving the descending aorta.   8. Small PFO.     Neuro/Psych Hx of trigeminal neuralgia  negative psych ROS   GI/Hepatic negative GI ROS, Neg liver ROS,,,Hx of alcohol withdrawal   Endo/Other  negative endocrine ROS    Renal/GU negative Renal ROS  negative genitourinary   Musculoskeletal negative musculoskeletal ROS (+)    Abdominal   Peds negative pediatric ROS (+)  Hematology negative hematology ROS (+)   Anesthesia Other Findings   Reproductive/Obstetrics negative OB ROS                               Anesthesia Physical Anesthesia Plan  ASA: 4  Anesthesia Plan: General   Post-op Pain Management:    Induction: Intravenous  PONV Risk Score and Plan: Treatment may vary due to age or medical condition  Airway Management Planned: Oral ETT  Additional Equipment: Arterial line, CVP, PA Cath, TEE, 3D TEE and Ultrasound Guidance Line Placement  Intra-op Plan:   Post-operative Plan: Extubation in OR  Informed Consent: I have reviewed the patients History and Physical, chart, labs and discussed the procedure including the risks, benefits and alternatives for the proposed anesthesia with the patient or authorized representative who has indicated his/her understanding and acceptance.     Dental advisory given  Plan Discussed with: CRNA  Anesthesia Plan Comments:          Anesthesia Quick Evaluation

## 2022-12-08 ENCOUNTER — Inpatient Hospital Stay (HOSPITAL_COMMUNITY): Payer: Medicare Other | Admitting: Anesthesiology

## 2022-12-08 ENCOUNTER — Encounter (HOSPITAL_COMMUNITY)
Admission: RE | Disposition: A | Discharge status: Home Health | Payer: Self-pay | Source: Home / Self Care | Attending: Thoracic Surgery (Cardiothoracic Vascular Surgery)

## 2022-12-08 ENCOUNTER — Inpatient Hospital Stay (HOSPITAL_COMMUNITY): Payer: Medicare Other

## 2022-12-08 ENCOUNTER — Other Ambulatory Visit: Payer: Self-pay

## 2022-12-08 ENCOUNTER — Encounter (HOSPITAL_COMMUNITY): Payer: Self-pay | Admitting: Thoracic Surgery (Cardiothoracic Vascular Surgery)

## 2022-12-08 ENCOUNTER — Inpatient Hospital Stay (HOSPITAL_COMMUNITY)
Admission: RE | Admit: 2022-12-08 | Discharge: 2022-12-14 | DRG: 220 | Disposition: A | Discharge status: Home Health | Payer: Medicare Other | Attending: Thoracic Surgery (Cardiothoracic Vascular Surgery) | Admitting: Thoracic Surgery (Cardiothoracic Vascular Surgery)

## 2022-12-08 ENCOUNTER — Other Ambulatory Visit: Payer: Self-pay | Admitting: Cardiology

## 2022-12-08 DIAGNOSIS — F419 Anxiety disorder, unspecified: Secondary | ICD-10-CM | POA: Diagnosis not present

## 2022-12-08 DIAGNOSIS — F10139 Alcohol abuse with withdrawal, unspecified: Secondary | ICD-10-CM | POA: Diagnosis present

## 2022-12-08 DIAGNOSIS — Q2112 Patent foramen ovale: Secondary | ICD-10-CM | POA: Diagnosis not present

## 2022-12-08 DIAGNOSIS — Z9841 Cataract extraction status, right eye: Secondary | ICD-10-CM

## 2022-12-08 DIAGNOSIS — G500 Trigeminal neuralgia: Secondary | ICD-10-CM | POA: Diagnosis present

## 2022-12-08 DIAGNOSIS — I97190 Other postprocedural cardiac functional disturbances following cardiac surgery: Secondary | ICD-10-CM | POA: Diagnosis not present

## 2022-12-08 DIAGNOSIS — Z7901 Long term (current) use of anticoagulants: Secondary | ICD-10-CM | POA: Diagnosis not present

## 2022-12-08 DIAGNOSIS — Z85828 Personal history of other malignant neoplasm of skin: Secondary | ICD-10-CM | POA: Diagnosis not present

## 2022-12-08 DIAGNOSIS — D696 Thrombocytopenia, unspecified: Secondary | ICD-10-CM | POA: Diagnosis not present

## 2022-12-08 DIAGNOSIS — D62 Acute posthemorrhagic anemia: Secondary | ICD-10-CM | POA: Diagnosis not present

## 2022-12-08 DIAGNOSIS — J984 Other disorders of lung: Secondary | ICD-10-CM | POA: Diagnosis not present

## 2022-12-08 DIAGNOSIS — I10 Essential (primary) hypertension: Secondary | ICD-10-CM | POA: Diagnosis not present

## 2022-12-08 DIAGNOSIS — Z01818 Encounter for other preprocedural examination: Secondary | ICD-10-CM

## 2022-12-08 DIAGNOSIS — J939 Pneumothorax, unspecified: Secondary | ICD-10-CM | POA: Diagnosis not present

## 2022-12-08 DIAGNOSIS — I340 Nonrheumatic mitral (valve) insufficiency: Principal | ICD-10-CM | POA: Diagnosis present

## 2022-12-08 DIAGNOSIS — R451 Restlessness and agitation: Secondary | ICD-10-CM | POA: Diagnosis not present

## 2022-12-08 DIAGNOSIS — R0989 Other specified symptoms and signs involving the circulatory and respiratory systems: Secondary | ICD-10-CM | POA: Diagnosis not present

## 2022-12-08 DIAGNOSIS — Z79899 Other long term (current) drug therapy: Secondary | ICD-10-CM

## 2022-12-08 DIAGNOSIS — I119 Hypertensive heart disease without heart failure: Secondary | ICD-10-CM | POA: Diagnosis present

## 2022-12-08 DIAGNOSIS — E8770 Fluid overload, unspecified: Secondary | ICD-10-CM | POA: Diagnosis not present

## 2022-12-08 DIAGNOSIS — Z961 Presence of intraocular lens: Secondary | ICD-10-CM | POA: Diagnosis present

## 2022-12-08 DIAGNOSIS — E7800 Pure hypercholesterolemia, unspecified: Secondary | ICD-10-CM | POA: Diagnosis present

## 2022-12-08 DIAGNOSIS — J90 Pleural effusion, not elsewhere classified: Secondary | ICD-10-CM | POA: Diagnosis not present

## 2022-12-08 DIAGNOSIS — I341 Nonrheumatic mitral (valve) prolapse: Secondary | ICD-10-CM | POA: Diagnosis not present

## 2022-12-08 DIAGNOSIS — F05 Delirium due to known physiological condition: Secondary | ICD-10-CM | POA: Diagnosis not present

## 2022-12-08 DIAGNOSIS — I700 Atherosclerosis of aorta: Secondary | ICD-10-CM | POA: Diagnosis present

## 2022-12-08 DIAGNOSIS — I4891 Unspecified atrial fibrillation: Secondary | ICD-10-CM | POA: Diagnosis not present

## 2022-12-08 DIAGNOSIS — J9811 Atelectasis: Secondary | ICD-10-CM | POA: Diagnosis not present

## 2022-12-08 DIAGNOSIS — Z8701 Personal history of pneumonia (recurrent): Secondary | ICD-10-CM

## 2022-12-08 DIAGNOSIS — Z9889 Other specified postprocedural states: Secondary | ICD-10-CM

## 2022-12-08 DIAGNOSIS — I480 Paroxysmal atrial fibrillation: Secondary | ICD-10-CM | POA: Diagnosis present

## 2022-12-08 DIAGNOSIS — R918 Other nonspecific abnormal finding of lung field: Secondary | ICD-10-CM | POA: Diagnosis not present

## 2022-12-08 DIAGNOSIS — Z1152 Encounter for screening for COVID-19: Secondary | ICD-10-CM

## 2022-12-08 DIAGNOSIS — Z4682 Encounter for fitting and adjustment of non-vascular catheter: Secondary | ICD-10-CM | POA: Diagnosis not present

## 2022-12-08 DIAGNOSIS — I3481 Nonrheumatic mitral (valve) annulus calcification: Secondary | ICD-10-CM | POA: Diagnosis present

## 2022-12-08 DIAGNOSIS — Z452 Encounter for adjustment and management of vascular access device: Secondary | ICD-10-CM | POA: Diagnosis not present

## 2022-12-08 DIAGNOSIS — Z9842 Cataract extraction status, left eye: Secondary | ICD-10-CM

## 2022-12-08 DIAGNOSIS — Z8249 Family history of ischemic heart disease and other diseases of the circulatory system: Secondary | ICD-10-CM

## 2022-12-08 HISTORY — PX: TEE WITHOUT CARDIOVERSION: SHX5443

## 2022-12-08 HISTORY — PX: MAZE: SHX5063

## 2022-12-08 HISTORY — PX: CLIPPING OF ATRIAL APPENDAGE: SHX5773

## 2022-12-08 HISTORY — PX: MITRAL VALVE REPAIR: SHX2039

## 2022-12-08 LAB — POCT I-STAT 7, (LYTES, BLD GAS, ICA,H+H)
Acid-Base Excess: 0 mmol/L (ref 0.0–2.0)
Acid-Base Excess: 1 mmol/L (ref 0.0–2.0)
Acid-base deficit: 3 mmol/L — ABNORMAL HIGH (ref 0.0–2.0)
Acid-base deficit: 1 mmol/L (ref 0.0–2.0)
Acid-base deficit: 1 mmol/L (ref 0.0–2.0)
Acid-base deficit: 4 mmol/L — ABNORMAL HIGH (ref 0.0–2.0)
Bicarbonate: 22 mmol/L (ref 20.0–28.0)
Bicarbonate: 24.6 mmol/L (ref 20.0–28.0)
Bicarbonate: 25.4 mmol/L (ref 20.0–28.0)
Bicarbonate: 24 mmol/L (ref 20.0–28.0)
Bicarbonate: 23.2 mmol/L (ref 20.0–28.0)
Bicarbonate: 21.3 mmol/L (ref 20.0–28.0)
Calcium, Ion: 1.21 mmol/L (ref 1.15–1.40)
Calcium, Ion: 1.06 mmol/L — ABNORMAL LOW (ref 1.15–1.40)
Calcium, Ion: 1.11 mmol/L — ABNORMAL LOW (ref 1.15–1.40)
Calcium, Ion: 1.1 mmol/L — ABNORMAL LOW (ref 1.15–1.40)
Calcium, Ion: 1.25 mmol/L (ref 1.15–1.40)
Calcium, Ion: 1.15 mmol/L (ref 1.15–1.40)
HCT: 38 % — ABNORMAL LOW (ref 39.0–52.0)
HCT: 26 % — ABNORMAL LOW (ref 39.0–52.0)
HCT: 25 % — ABNORMAL LOW (ref 39.0–52.0)
HCT: 26 % — ABNORMAL LOW (ref 39.0–52.0)
HCT: 32 % — ABNORMAL LOW (ref 39.0–52.0)
HCT: 28 % — ABNORMAL LOW (ref 39.0–52.0)
Hemoglobin: 12.9 g/dL — ABNORMAL LOW (ref 13.0–17.0)
Hemoglobin: 8.8 g/dL — ABNORMAL LOW (ref 13.0–17.0)
Hemoglobin: 8.5 g/dL — ABNORMAL LOW (ref 13.0–17.0)
Hemoglobin: 8.8 g/dL — ABNORMAL LOW (ref 13.0–17.0)
Hemoglobin: 10.9 g/dL — ABNORMAL LOW (ref 13.0–17.0)
Hemoglobin: 9.5 g/dL — ABNORMAL LOW (ref 13.0–17.0)
O2 Saturation: 100 %
O2 Saturation: 100 %
O2 Saturation: 100 %
O2 Saturation: 100 %
O2 Saturation: 98 %
O2 Saturation: 96 %
Patient temperature: 34.9
Patient temperature: 38.5
Potassium: 3.3 mmol/L — ABNORMAL LOW (ref 3.5–5.1)
Potassium: 3.5 mmol/L (ref 3.5–5.1)
Potassium: 4 mmol/L (ref 3.5–5.1)
Potassium: 3.7 mmol/L (ref 3.5–5.1)
Potassium: 3.5 mmol/L (ref 3.5–5.1)
Potassium: 3.5 mmol/L (ref 3.5–5.1)
Sodium: 144 mmol/L (ref 135–145)
Sodium: 140 mmol/L (ref 135–145)
Sodium: 138 mmol/L (ref 135–145)
Sodium: 139 mmol/L (ref 135–145)
Sodium: 139 mmol/L (ref 135–145)
Sodium: 142 mmol/L (ref 135–145)
TCO2: 23 mmol/L (ref 22–32)
TCO2: 26 mmol/L (ref 22–32)
TCO2: 27 mmol/L (ref 22–32)
TCO2: 25 mmol/L (ref 22–32)
TCO2: 24 mmol/L (ref 22–32)
TCO2: 22 mmol/L (ref 22–32)
pCO2 arterial: 37.7 mm[Hg] (ref 32–48)
pCO2 arterial: 41 mm[Hg] (ref 32–48)
pCO2 arterial: 37.9 mm[Hg] (ref 32–48)
pCO2 arterial: 38 mm[Hg] (ref 32–48)
pCO2 arterial: 33.6 mm[Hg] (ref 32–48)
pCO2 arterial: 39.3 mm[Hg] (ref 32–48)
pH, Arterial: 7.375 (ref 7.35–7.45)
pH, Arterial: 7.386 (ref 7.35–7.45)
pH, Arterial: 7.434 (ref 7.35–7.45)
pH, Arterial: 7.407 (ref 7.35–7.45)
pH, Arterial: 7.44 (ref 7.35–7.45)
pH, Arterial: 7.348 — ABNORMAL LOW (ref 7.35–7.45)
pO2, Arterial: 405 mm[Hg] — ABNORMAL HIGH (ref 83–108)
pO2, Arterial: 396 mm[Hg] — ABNORMAL HIGH (ref 83–108)
pO2, Arterial: 254 mm[Hg] — ABNORMAL HIGH (ref 83–108)
pO2, Arterial: 196 mm[Hg] — ABNORMAL HIGH (ref 83–108)
pO2, Arterial: 98 mm[Hg] (ref 83–108)
pO2, Arterial: 90 mm[Hg] (ref 83–108)

## 2022-12-08 LAB — POCT I-STAT, CHEM 8
BUN: 17 mg/dL (ref 8–23)
BUN: 15 mg/dL (ref 8–23)
BUN: 16 mg/dL (ref 8–23)
BUN: 15 mg/dL (ref 8–23)
Calcium, Ion: 1.26 mmol/L (ref 1.15–1.40)
Calcium, Ion: 1.12 mmol/L — ABNORMAL LOW (ref 1.15–1.40)
Calcium, Ion: 1.2 mmol/L (ref 1.15–1.40)
Calcium, Ion: 1.12 mmol/L — ABNORMAL LOW (ref 1.15–1.40)
Chloride: 106 mmol/L (ref 98–111)
Chloride: 104 mmol/L (ref 98–111)
Chloride: 104 mmol/L (ref 98–111)
Chloride: 103 mmol/L (ref 98–111)
Creatinine, Ser: 0.6 mg/dL — ABNORMAL LOW (ref 0.61–1.24)
Creatinine, Ser: 0.6 mg/dL — ABNORMAL LOW (ref 0.61–1.24)
Creatinine, Ser: 0.6 mg/dL — ABNORMAL LOW (ref 0.61–1.24)
Creatinine, Ser: 0.6 mg/dL — ABNORMAL LOW (ref 0.61–1.24)
Glucose, Bld: 103 mg/dL — ABNORMAL HIGH (ref 70–99)
Glucose, Bld: 119 mg/dL — ABNORMAL HIGH (ref 70–99)
Glucose, Bld: 99 mg/dL (ref 70–99)
Glucose, Bld: 146 mg/dL — ABNORMAL HIGH (ref 70–99)
HCT: 34 % — ABNORMAL LOW (ref 39.0–52.0)
HCT: 25 % — ABNORMAL LOW (ref 39.0–52.0)
HCT: 33 % — ABNORMAL LOW (ref 39.0–52.0)
HCT: 26 % — ABNORMAL LOW (ref 39.0–52.0)
Hemoglobin: 11.6 g/dL — ABNORMAL LOW (ref 13.0–17.0)
Hemoglobin: 11.2 g/dL — ABNORMAL LOW (ref 13.0–17.0)
Hemoglobin: 8.5 g/dL — ABNORMAL LOW (ref 13.0–17.0)
Hemoglobin: 8.8 g/dL — ABNORMAL LOW (ref 13.0–17.0)
Potassium: 3.4 mmol/L — ABNORMAL LOW (ref 3.5–5.1)
Potassium: 3.6 mmol/L (ref 3.5–5.1)
Potassium: 4 mmol/L (ref 3.5–5.1)
Potassium: 3.7 mmol/L (ref 3.5–5.1)
Sodium: 143 mmol/L (ref 135–145)
Sodium: 140 mmol/L (ref 135–145)
Sodium: 141 mmol/L (ref 135–145)
Sodium: 139 mmol/L (ref 135–145)
TCO2: 26 mmol/L (ref 22–32)
TCO2: 25 mmol/L (ref 22–32)
TCO2: 26 mmol/L (ref 22–32)
TCO2: 24 mmol/L (ref 22–32)

## 2022-12-08 LAB — CBC
HCT: 33.2 % — ABNORMAL LOW (ref 39.0–52.0)
HCT: 29.3 % — ABNORMAL LOW (ref 39.0–52.0)
Hemoglobin: 10.9 g/dL — ABNORMAL LOW (ref 13.0–17.0)
Hemoglobin: 9.4 g/dL — ABNORMAL LOW (ref 13.0–17.0)
MCH: 29.7 pg (ref 26.0–34.0)
MCH: 29.9 pg (ref 26.0–34.0)
MCHC: 32.8 g/dL (ref 30.0–36.0)
MCHC: 32.1 g/dL (ref 30.0–36.0)
MCV: 90.5 fL (ref 80.0–100.0)
MCV: 93.3 fL (ref 80.0–100.0)
Platelets: 99 10*3/uL — ABNORMAL LOW (ref 150–400)
Platelets: 88 10*3/uL — ABNORMAL LOW (ref 150–400)
RBC: 3.67 MIL/uL — ABNORMAL LOW (ref 4.22–5.81)
RBC: 3.14 MIL/uL — ABNORMAL LOW (ref 4.22–5.81)
RDW: 13.6 % (ref 11.5–15.5)
RDW: 13.8 % (ref 11.5–15.5)
WBC: 11.5 10*3/uL — ABNORMAL HIGH (ref 4.0–10.5)
WBC: 9.3 10*3/uL (ref 4.0–10.5)
nRBC: 0 % (ref 0.0–0.2)
nRBC: 0 % (ref 0.0–0.2)

## 2022-12-08 LAB — PROTIME-INR
INR: 1.3 — ABNORMAL HIGH (ref 0.8–1.2)
Prothrombin Time: 16.7 s — ABNORMAL HIGH (ref 11.4–15.2)

## 2022-12-08 LAB — POCT I-STAT EG7
Acid-Base Excess: 0 mmol/L (ref 0.0–2.0)
Bicarbonate: 24.7 mmol/L (ref 20.0–28.0)
Calcium, Ion: 1.09 mmol/L — ABNORMAL LOW (ref 1.15–1.40)
HCT: 26 % — ABNORMAL LOW (ref 39.0–52.0)
Hemoglobin: 8.8 g/dL — ABNORMAL LOW (ref 13.0–17.0)
O2 Saturation: 81 %
Potassium: 3.5 mmol/L (ref 3.5–5.1)
Sodium: 141 mmol/L (ref 135–145)
TCO2: 26 mmol/L (ref 22–32)
pCO2, Ven: 40.9 mm[Hg] — ABNORMAL LOW (ref 44–60)
pH, Ven: 7.389 (ref 7.25–7.43)
pO2, Ven: 46 mm[Hg] — ABNORMAL HIGH (ref 32–45)

## 2022-12-08 LAB — ECHO INTRAOPERATIVE TEE
AR max vel: 3.46 cm2
AV Area VTI: 3.39 cm2
AV Area mean vel: 3.35 cm2
AV Mean grad: 3 mm[Hg]
AV Peak grad: 6.6 mm[Hg]
Ao pk vel: 1.29 m/s
Area-P 1/2: 2 cm2
Calc EF: 57.4 %
Height: 73 in
MV VTI: 2.79 cm2
Single Plane A2C EF: 56 %
Single Plane A4C EF: 58.3 %
Weight: 2496 [oz_av]

## 2022-12-08 LAB — PLATELET COUNT: Platelets: 89 10*3/uL — ABNORMAL LOW (ref 150–400)

## 2022-12-08 LAB — BASIC METABOLIC PANEL
Anion gap: 9 (ref 5–15)
BUN: 13 mg/dL (ref 8–23)
CO2: 23 mmol/L (ref 22–32)
Calcium: 7.9 mg/dL — ABNORMAL LOW (ref 8.9–10.3)
Chloride: 107 mmol/L (ref 98–111)
Creatinine, Ser: 0.68 mg/dL (ref 0.61–1.24)
GFR, Estimated: >60 mL/min (ref >60)
Glucose, Bld: 130 mg/dL — ABNORMAL HIGH (ref 70–99)
Potassium: 3.5 mmol/L (ref 3.5–5.1)
Sodium: 139 mmol/L (ref 135–145)

## 2022-12-08 LAB — GLUCOSE, CAPILLARY
Glucose-Capillary: 113 mg/dL — ABNORMAL HIGH (ref 70–99)
Glucose-Capillary: 122 mg/dL — ABNORMAL HIGH (ref 70–99)
Glucose-Capillary: 111 mg/dL — ABNORMAL HIGH (ref 70–99)
Glucose-Capillary: 94 mg/dL (ref 70–99)
Glucose-Capillary: 131 mg/dL — ABNORMAL HIGH (ref 70–99)
Glucose-Capillary: 132 mg/dL — ABNORMAL HIGH (ref 70–99)
Glucose-Capillary: 131 mg/dL — ABNORMAL HIGH (ref 70–99)
Glucose-Capillary: 145 mg/dL — ABNORMAL HIGH (ref 70–99)
Glucose-Capillary: 163 mg/dL — ABNORMAL HIGH (ref 70–99)
Glucose-Capillary: 150 mg/dL — ABNORMAL HIGH (ref 70–99)

## 2022-12-08 LAB — ABO/RH: ABO/RH(D): A NEG

## 2022-12-08 LAB — MAGNESIUM: Magnesium: 2.4 mg/dL (ref 1.7–2.4)

## 2022-12-08 LAB — HEMOGLOBIN AND HEMATOCRIT, BLOOD
HCT: 26.1 % — ABNORMAL LOW (ref 39.0–52.0)
Hemoglobin: 8.4 g/dL — ABNORMAL LOW (ref 13.0–17.0)

## 2022-12-08 LAB — APTT: aPTT: 37 s — ABNORMAL HIGH (ref 24–36)

## 2022-12-08 SURGERY — REPAIR, MITRAL VALVE
Anesthesia: General | Site: Chest

## 2022-12-08 MED ORDER — ACETAMINOPHEN 160 MG/5ML PO SOLN: 1000.0000 mg | Freq: Four times a day (QID) | ORAL | Status: AC

## 2022-12-08 MED ORDER — ONDANSETRON HCL 4 MG/2ML IJ SOLN: 4.0000 mg | Freq: Four times a day (QID) | INTRAMUSCULAR | Status: DC | PRN

## 2022-12-08 MED ORDER — MAGNESIUM SULFATE 4 GM/100ML IV SOLN
4.0000 g | Freq: Once | INTRAVENOUS | Status: AC
  Administered 2022-12-08: 4 g via INTRAVENOUS
  Filled 2022-12-08: qty 100

## 2022-12-08 MED ORDER — LACTATED RINGERS IV SOLN: INTRAVENOUS | Status: DC

## 2022-12-08 MED ORDER — DEXMEDETOMIDINE HCL IN NACL 400 MCG/100ML IV SOLN: 0.0000 ug/kg/h | INTRAVENOUS | Status: DC

## 2022-12-08 MED ORDER — ACETAMINOPHEN 160 MG/5ML PO SOLN
650.0000 mg | Freq: Once | ORAL | Status: DC
  Filled 2022-12-08: qty 20.3

## 2022-12-08 MED ORDER — ACETAMINOPHEN 500 MG PO TABS
1000.0000 mg | ORAL_TABLET | Freq: Four times a day (QID) | ORAL | Status: AC
  Administered 2022-12-09 – 2022-12-13 (×17): 1000 mg via ORAL
  Filled 2022-12-08 (×17): qty 2

## 2022-12-08 MED ORDER — SODIUM CHLORIDE 0.9% FLUSH: 3.0000 mL | INTRAVENOUS | Status: DC | PRN

## 2022-12-08 MED ORDER — GABAPENTIN 300 MG PO CAPS
300.0000 mg | ORAL_CAPSULE | Freq: Two times a day (BID) | ORAL | Status: DC
  Administered 2022-12-08 – 2022-12-14 (×12): 300 mg via ORAL
  Filled 2022-12-08 (×12): qty 1

## 2022-12-08 MED ORDER — FENTANYL CITRATE (PF) 250 MCG/5ML IJ SOLN
INTRAMUSCULAR | Status: DC | PRN
  Administered 2022-12-08: 250 ug via INTRAVENOUS
  Administered 2022-12-08: 200 ug via INTRAVENOUS
  Administered 2022-12-08: 250 ug via INTRAVENOUS
  Administered 2022-12-08: 50 ug via INTRAVENOUS

## 2022-12-08 MED ORDER — ASPIRIN 81 MG PO CHEW
324.0000 mg | CHEWABLE_TABLET | Freq: Every day | ORAL | Status: DC
  Filled 2022-12-08: qty 4

## 2022-12-08 MED ORDER — LACTATED RINGERS IV SOLN: INTRAVENOUS | Status: DC | PRN

## 2022-12-08 MED ORDER — MIDAZOLAM HCL 2 MG/2ML IJ SOLN
INTRAMUSCULAR | Status: AC
  Filled 2022-12-08: qty 2

## 2022-12-08 MED ORDER — TRAMADOL HCL 50 MG PO TABS
50.0000 mg | ORAL_TABLET | ORAL | Status: DC | PRN
  Administered 2022-12-09 – 2022-12-10 (×4): 100 mg via ORAL
  Filled 2022-12-08 (×4): qty 2

## 2022-12-08 MED ORDER — CHLORHEXIDINE GLUCONATE 0.12 % MT SOLN
15.0000 mL | OROMUCOSAL | Status: AC
  Administered 2022-12-08: 15 mL via OROMUCOSAL
  Filled 2022-12-08: qty 15

## 2022-12-08 MED ORDER — BISACODYL 10 MG RE SUPP: 10.0000 mg | Freq: Every day | RECTAL | Status: DC

## 2022-12-08 MED ORDER — METOPROLOL TARTRATE 5 MG/5ML IV SOLN
2.5000 mg | INTRAVENOUS | Status: DC | PRN
  Administered 2022-12-09: 2.5 mg via INTRAVENOUS
  Administered 2022-12-10: 3 mg via INTRAVENOUS
  Administered 2022-12-10: 2.5 mg via INTRAVENOUS
  Administered 2022-12-10: 5 mg via INTRAVENOUS
  Administered 2022-12-10: 3 mg via INTRAVENOUS
  Filled 2022-12-08 (×6): qty 5

## 2022-12-08 MED ORDER — PROPOFOL 10 MG/ML IV BOLUS
INTRAVENOUS | Status: AC
  Filled 2022-12-08: qty 20

## 2022-12-08 MED ORDER — PROPOFOL 10 MG/ML IV BOLUS
INTRAVENOUS | Status: DC | PRN
Start: 2022-12-08 — End: 2022-12-08
  Administered 2022-12-08: 30 mg via INTRAVENOUS
  Administered 2022-12-08: 50 mg via INTRAVENOUS
  Administered 2022-12-08: 30 mg via INTRAVENOUS

## 2022-12-08 MED ORDER — ACETAMINOPHEN 10 MG/ML IV SOLN
1000.0000 mg | Freq: Three times a day (TID) | INTRAVENOUS | Status: AC
  Administered 2022-12-08 – 2022-12-09 (×3): 1000 mg via INTRAVENOUS
  Filled 2022-12-08 (×3): qty 100

## 2022-12-08 MED ORDER — HYDRALAZINE HCL 20 MG/ML IJ SOLN
5.0000 mg | INTRAMUSCULAR | Status: DC | PRN
  Administered 2022-12-08 – 2022-12-10 (×3): 5 mg via INTRAVENOUS
  Filled 2022-12-08 (×3): qty 1

## 2022-12-08 MED ORDER — DOCUSATE SODIUM 100 MG PO CAPS
200.0000 mg | ORAL_CAPSULE | Freq: Every day | ORAL | Status: DC
  Administered 2022-12-09 – 2022-12-13 (×4): 200 mg via ORAL
  Filled 2022-12-08 (×5): qty 2

## 2022-12-08 MED ORDER — SODIUM CHLORIDE 0.9% FLUSH
3.0000 mL | Freq: Two times a day (BID) | INTRAVENOUS | Status: DC
  Administered 2022-12-09 – 2022-12-12 (×7): 3 mL via INTRAVENOUS

## 2022-12-08 MED ORDER — HYDROMORPHONE HCL 1 MG/ML IJ SOLN
INTRAMUSCULAR | Status: DC | PRN
Start: 2022-12-08 — End: 2022-12-08
  Administered 2022-12-08: .5 mg via INTRAVENOUS

## 2022-12-08 MED ORDER — SODIUM CHLORIDE 0.9 % IV SOLN: 250.0000 mL | INTRAVENOUS | Status: DC

## 2022-12-08 MED ORDER — FENTANYL CITRATE (PF) 250 MCG/5ML IJ SOLN
INTRAMUSCULAR | Status: AC
  Filled 2022-12-08: qty 5

## 2022-12-08 MED ORDER — EPINEPHRINE PF 1 MG/ML IJ SOLN
INTRAMUSCULAR | Status: DC | PRN
Start: 2022-12-08 — End: 2022-12-08
  Administered 2022-12-08: .05 mg via INTRAVENOUS

## 2022-12-08 MED ORDER — INSULIN REGULAR(HUMAN) IN NACL 100-0.9 UT/100ML-% IV SOLN: INTRAVENOUS | Status: DC

## 2022-12-08 MED ORDER — MIDAZOLAM HCL (PF) 5 MG/ML IJ SOLN
INTRAMUSCULAR | Status: DC | PRN
  Administered 2022-12-08: 2 mg via INTRAVENOUS

## 2022-12-08 MED ORDER — VANCOMYCIN HCL 1000 MG IV SOLR
INTRAVENOUS | Status: DC | PRN
  Administered 2022-12-08: 1000 mL

## 2022-12-08 MED ORDER — PANTOPRAZOLE SODIUM 40 MG PO TBEC
40.0000 mg | DELAYED_RELEASE_TABLET | Freq: Every day | ORAL | Status: DC
  Administered 2022-12-10 – 2022-12-14 (×5): 40 mg via ORAL
  Filled 2022-12-08 (×5): qty 1

## 2022-12-08 MED ORDER — CHLORHEXIDINE GLUCONATE 4 % EX SOLN: 30.0000 mL | CUTANEOUS | Status: DC

## 2022-12-08 MED ORDER — PHENYLEPHRINE 80 MCG/ML (10ML) SYRINGE FOR IV PUSH (FOR BLOOD PRESSURE SUPPORT)
PREFILLED_SYRINGE | INTRAVENOUS | Status: DC | PRN
  Administered 2022-12-08 (×5): 80 ug via INTRAVENOUS

## 2022-12-08 MED ORDER — THIAMINE MONONITRATE 100 MG PO TABS
100.0000 mg | ORAL_TABLET | Freq: Every day | ORAL | Status: DC
  Administered 2022-12-09 – 2022-12-14 (×6): 100 mg via ORAL
  Filled 2022-12-08 (×6): qty 1

## 2022-12-08 MED ORDER — ALBUMIN HUMAN 5 % IV SOLN
250.0000 mL | INTRAVENOUS | Status: DC | PRN
  Administered 2022-12-08 (×4): 12.5 g via INTRAVENOUS
  Filled 2022-12-08: qty 250

## 2022-12-08 MED ORDER — PLASMA-LYTE A IV SOLN
INTRAVENOUS | Status: DC | PRN
  Administered 2022-12-08: 500 mL

## 2022-12-08 MED ORDER — DEXTROSE 50 % IV SOLN: 0.0000 mL | INTRAVENOUS | Status: DC | PRN

## 2022-12-08 MED ORDER — PROPOFOL 500 MG/50ML IV EMUL
INTRAVENOUS | Status: DC | PRN
Start: 2022-12-08 — End: 2022-12-08
  Administered 2022-12-08: 50 ug/kg/min via INTRAVENOUS

## 2022-12-08 MED ORDER — CEFAZOLIN SODIUM-DEXTROSE 2-4 GM/100ML-% IV SOLN
2.0000 g | Freq: Three times a day (TID) | INTRAVENOUS | Status: AC
  Administered 2022-12-08 – 2022-12-10 (×6): 2 g via INTRAVENOUS
  Filled 2022-12-08 (×6): qty 100

## 2022-12-08 MED ORDER — SODIUM CHLORIDE 0.9 % IV SOLN: INTRAVENOUS | Status: DC

## 2022-12-08 MED ORDER — METOCLOPRAMIDE HCL 5 MG/ML IJ SOLN
10.0000 mg | Freq: Four times a day (QID) | INTRAMUSCULAR | Status: AC
  Administered 2022-12-08 – 2022-12-09 (×6): 10 mg via INTRAVENOUS
  Filled 2022-12-08 (×6): qty 2

## 2022-12-08 MED ORDER — MORPHINE SULFATE (PF) 2 MG/ML IV SOLN
1.0000 mg | INTRAVENOUS | Status: DC | PRN
  Administered 2022-12-08 (×2): 2 mg via INTRAVENOUS
  Administered 2022-12-08: 4 mg via INTRAVENOUS
  Administered 2022-12-09: 2 mg via INTRAVENOUS
  Administered 2022-12-09 (×3): 4 mg via INTRAVENOUS
  Administered 2022-12-09: 2 mg via INTRAVENOUS
  Administered 2022-12-09 – 2022-12-10 (×4): 4 mg via INTRAVENOUS
  Administered 2022-12-10: 2 mg via INTRAVENOUS
  Filled 2022-12-08 (×4): qty 2
  Filled 2022-12-08: qty 1
  Filled 2022-12-08 (×2): qty 2
  Filled 2022-12-08 (×3): qty 1
  Filled 2022-12-08 (×2): qty 2
  Filled 2022-12-08: qty 1

## 2022-12-08 MED ORDER — NITROGLYCERIN IN D5W 200-5 MCG/ML-% IV SOLN
7.0000 ug/min | INTRAVENOUS | Status: DC
  Administered 2022-12-08: 5 ug/min via INTRAVENOUS

## 2022-12-08 MED ORDER — CALCIUM CHLORIDE 10 % IV SOLN
INTRAVENOUS | Status: DC | PRN
Start: 2022-12-08 — End: 2022-12-08
  Administered 2022-12-08: 300 mg via INTRAVENOUS
  Administered 2022-12-08: 500 mg via INTRAVENOUS
  Administered 2022-12-08: 200 mg via INTRAVENOUS

## 2022-12-08 MED ORDER — PANTOPRAZOLE SODIUM 40 MG IV SOLR
40.0000 mg | Freq: Every day | INTRAVENOUS | Status: AC
  Administered 2022-12-08 – 2022-12-09 (×2): 40 mg via INTRAVENOUS
  Filled 2022-12-08 (×2): qty 10

## 2022-12-08 MED ORDER — PHENYLEPHRINE HCL-NACL 20-0.9 MG/250ML-% IV SOLN
INTRAVENOUS | Status: DC | PRN
  Administered 2022-12-08: 20 ug/min via INTRAVENOUS

## 2022-12-08 MED ORDER — NITROGLYCERIN IN D5W 200-5 MCG/ML-% IV SOLN
INTRAVENOUS | Status: AC
  Filled 2022-12-08: qty 250

## 2022-12-08 MED ORDER — ASPIRIN 325 MG PO TBEC
325.0000 mg | DELAYED_RELEASE_TABLET | Freq: Every day | ORAL | Status: DC
  Administered 2022-12-09 – 2022-12-10 (×2): 325 mg via ORAL
  Filled 2022-12-08 (×2): qty 1

## 2022-12-08 MED ORDER — HEPARIN SODIUM (PORCINE) 1000 UNIT/ML IJ SOLN
INTRAMUSCULAR | Status: DC | PRN
  Administered 2022-12-08: 26000 [IU] via INTRAVENOUS

## 2022-12-08 MED ORDER — POTASSIUM CHLORIDE 10 MEQ/50ML IV SOLN
10.0000 meq | INTRAVENOUS | Status: AC
  Administered 2022-12-08 (×3): 10 meq via INTRAVENOUS

## 2022-12-08 MED ORDER — METOPROLOL TARTRATE 12.5 MG HALF TABLET
12.5000 mg | ORAL_TABLET | Freq: Two times a day (BID) | ORAL | Status: DC
  Administered 2022-12-09 (×2): 12.5 mg via ORAL
  Filled 2022-12-08 (×2): qty 1

## 2022-12-08 MED ORDER — ORAL CARE MOUTH RINSE: 15.0000 mL | Freq: Once | OROMUCOSAL | Status: AC

## 2022-12-08 MED ORDER — ASPIRIN 81 MG PO CHEW: 324.0000 mg | CHEWABLE_TABLET | Freq: Once | ORAL | Status: DC

## 2022-12-08 MED ORDER — OXYCODONE HCL 5 MG PO TABS
5.0000 mg | ORAL_TABLET | ORAL | Status: DC | PRN
  Administered 2022-12-08: 5 mg via ORAL
  Administered 2022-12-09 – 2022-12-10 (×7): 10 mg via ORAL
  Administered 2022-12-14: 5 mg via ORAL
  Filled 2022-12-08: qty 1
  Filled 2022-12-08 (×5): qty 2
  Filled 2022-12-08: qty 1
  Filled 2022-12-08 (×2): qty 2

## 2022-12-08 MED ORDER — PROTAMINE SULFATE 10 MG/ML IV SOLN
INTRAVENOUS | Status: DC | PRN
  Administered 2022-12-08: 250 mg via INTRAVENOUS

## 2022-12-08 MED ORDER — VANCOMYCIN HCL IN DEXTROSE 1-5 GM/200ML-% IV SOLN
1000.0000 mg | Freq: Once | INTRAVENOUS | Status: AC
  Administered 2022-12-08: 1000 mg via INTRAVENOUS
  Filled 2022-12-08: qty 200

## 2022-12-08 MED ORDER — ~~LOC~~ CARDIAC SURGERY, PATIENT & FAMILY EDUCATION
Freq: Once | Status: DC
  Filled 2022-12-08: qty 1

## 2022-12-08 MED ORDER — SODIUM CHLORIDE 0.45 % IV SOLN: INTRAVENOUS | Status: DC | PRN

## 2022-12-08 MED ORDER — 0.9 % SODIUM CHLORIDE (POUR BTL) OPTIME
TOPICAL | Status: DC | PRN
  Administered 2022-12-08: 5000 mL

## 2022-12-08 MED ORDER — HYDROMORPHONE HCL 1 MG/ML IJ SOLN
INTRAMUSCULAR | Status: AC
  Filled 2022-12-08: qty 0.5

## 2022-12-08 MED ORDER — CHLORHEXIDINE GLUCONATE CLOTH 2 % EX PADS
6.0000 | MEDICATED_PAD | Freq: Every day | CUTANEOUS | Status: DC
  Administered 2022-12-09 – 2022-12-13 (×4): 6 via TOPICAL

## 2022-12-08 MED ORDER — PRAVASTATIN SODIUM 10 MG PO TABS
10.0000 mg | ORAL_TABLET | Freq: Every day | ORAL | Status: DC
  Administered 2022-12-09 – 2022-12-13 (×5): 10 mg via ORAL
  Filled 2022-12-08 (×6): qty 1

## 2022-12-08 MED ORDER — METOPROLOL TARTRATE 25 MG/10 ML ORAL SUSPENSION: 12.5000 mg | Freq: Two times a day (BID) | ORAL | Status: DC

## 2022-12-08 MED ORDER — PHENYLEPHRINE HCL-NACL 20-0.9 MG/250ML-% IV SOLN: 0.0000 ug/min | INTRAVENOUS | Status: DC

## 2022-12-08 MED ORDER — PROPOFOL 500 MG/50ML IV EMUL
INTRAVENOUS | Status: DC | PRN
Start: 2022-12-08 — End: 2022-12-08

## 2022-12-08 MED ORDER — FOLIC ACID 1 MG PO TABS
1.0000 mg | ORAL_TABLET | Freq: Every day | ORAL | Status: DC
  Administered 2022-12-09 – 2022-12-14 (×6): 1 mg via ORAL
  Filled 2022-12-08 (×6): qty 1

## 2022-12-08 MED ORDER — FENTANYL CITRATE (PF) 100 MCG/2ML IJ SOLN
INTRAMUSCULAR | Status: AC
  Filled 2022-12-08: qty 2

## 2022-12-08 MED ORDER — POLYETHYL GLYCOL-PROPYL GLYCOL 0.4-0.3 % OP GEL
Freq: Every day | OPHTHALMIC | Status: DC | PRN
  Filled 2022-12-08: qty 10

## 2022-12-08 MED ORDER — METOPROLOL TARTRATE 12.5 MG HALF TABLET: 12.5000 mg | ORAL_TABLET | Freq: Once | ORAL | Status: DC

## 2022-12-08 MED ORDER — BISACODYL 5 MG PO TBEC
10.0000 mg | DELAYED_RELEASE_TABLET | Freq: Every day | ORAL | Status: DC
  Administered 2022-12-09 – 2022-12-13 (×4): 10 mg via ORAL
  Filled 2022-12-08 (×5): qty 2

## 2022-12-08 MED ORDER — LIDOCAINE HCL (CARDIAC) PF 100 MG/5ML IV SOSY
PREFILLED_SYRINGE | INTRAVENOUS | Status: DC | PRN
Start: 2022-12-08 — End: 2022-12-08
  Administered 2022-12-08: 100 mg via INTRAVENOUS

## 2022-12-08 MED ORDER — CHLORHEXIDINE GLUCONATE 0.12 % MT SOLN
15.0000 mL | Freq: Once | OROMUCOSAL | Status: DC
  Filled 2022-12-08: qty 15

## 2022-12-08 MED ORDER — CHLORHEXIDINE GLUCONATE 0.12 % MT SOLN
15.0000 mL | Freq: Once | OROMUCOSAL | Status: AC
  Administered 2022-12-08: 15 mL via OROMUCOSAL

## 2022-12-08 MED ORDER — ROCURONIUM BROMIDE 10 MG/ML (PF) SYRINGE
PREFILLED_SYRINGE | INTRAVENOUS | Status: DC | PRN
  Administered 2022-12-08: 100 mg via INTRAVENOUS

## 2022-12-08 MED ORDER — MIDAZOLAM HCL 2 MG/2ML IJ SOLN
2.0000 mg | INTRAMUSCULAR | Status: DC | PRN
  Administered 2022-12-08: 2 mg via INTRAVENOUS
  Filled 2022-12-08: qty 2

## 2022-12-08 SURGICAL SUPPLY — 93 items
5 PAIRS OF YELLOW SUTURE CLAMP (MISCELLANEOUS) ×3
ADAPTER CARDIO PERF ANTE/RETRO (ADAPTER) IMPLANT
ADPR PRFSN 84XANTGRD RTRGD (ADAPTER)
ANTIFOG SOL W/FOAM PAD STRL (MISCELLANEOUS) ×3
ATRICLIP EXCLUSION VLAA 50 (Miscellaneous) ×3 IMPLANT
BAG DECANTER FOR FLEXI CONT (MISCELLANEOUS) ×3 IMPLANT
BAND ANLPLS SIMULUS 34 (Prosthesis & Implant Heart) ×3 IMPLANT
BLADE CLIPPER SURG (BLADE) ×3 IMPLANT
BLADE STERNUM SYSTEM 6 (BLADE) ×3 IMPLANT
BOOT SUTURE VASCULAR YLW (MISCELLANEOUS) ×3
CANISTER SUCT 3000ML PPV (MISCELLANEOUS) ×3 IMPLANT
CANN PRFSN 3/8XCNCT ST RT ANG (MISCELLANEOUS) ×3
CANNULA LEFT HEART VENT 20FR (CATHETERS) IMPLANT
CANNULA NON VENT 20FR 12 (CANNULA) ×3 IMPLANT
CANNULA NON VENT 22FR 12 (CANNULA) ×3 IMPLANT
CANNULA PRFSN 3/8XCNCT RT ANG (MISCELLANEOUS) IMPLANT
CANNULA SUMP PERICARDIAL (CANNULA) ×3 IMPLANT
CANNULA VEN MTL TIP RT (MISCELLANEOUS) ×3
CANNULA VRC MALB SNGL STG 36FR (MISCELLANEOUS) IMPLANT
CATH HEART VENT LEFT (CATHETERS) ×3 IMPLANT
CATH RETROPLEGIA CORONARY 14FR (CATHETERS) IMPLANT
CATH ROBINSON RED A/P 18FR (CATHETERS) ×9 IMPLANT
CATH THOR STR 32F SOFT 20 RADI (CATHETERS) ×3 IMPLANT
CATH THORACIC 28FR RT ANG (CATHETERS) ×3 IMPLANT
CLAMP ISOLATOR SYNERGY LG (MISCELLANEOUS) IMPLANT
CLAMP SUTURE YELLOW 5 PAIRS (MISCELLANEOUS) ×3 IMPLANT
CLIP TI LARGE 6 (CLIP) IMPLANT
CLIP TI MEDIUM 24 (CLIP) IMPLANT
CONN Y 3/8X3/8X3/8 BEN (MISCELLANEOUS) IMPLANT
CONNECTOR 1/2X3/8X1/2 3WAY (MISCELLANEOUS) IMPLANT
DEVICE EXCLUSIN ATRCLP VLAA 50 (Miscellaneous) IMPLANT
DEVICE SUT CK QUICK LOAD INDV (Prosthesis & Implant Heart) IMPLANT
DRAPE CV SPLIT W-CLR ANES SCRN (DRAPES) ×3 IMPLANT
DRAPE INCISE IOBAN 66X45 STRL (DRAPES) ×3 IMPLANT
DRAPE PERI GROIN 82X75IN TIB (DRAPES) ×3 IMPLANT
DRSG AQUACEL AG ADV 3.5X10 (GAUZE/BANDAGES/DRESSINGS) ×3 IMPLANT
ELECT CAUTERY BLADE 6.4 (BLADE) ×3 IMPLANT
ELECT REM PT RETURN 9FT ADLT (ELECTROSURGICAL) ×6
ELECTRODE REM PT RTRN 9FT ADLT (ELECTROSURGICAL) ×6 IMPLANT
FELT TEFLON 1X6 (MISCELLANEOUS) ×3 IMPLANT
GAUZE SPONGE 4X4 12PLY STRL (GAUZE/BANDAGES/DRESSINGS) ×3 IMPLANT
GLOVE BIO SURGEON STRL SZ 6.5 (GLOVE) IMPLANT
GLOVE SS BIOGEL STRL SZ 6.5 (GLOVE) IMPLANT
GLOVE SURG SS PI 7.5 STRL IVOR (GLOVE) IMPLANT
GOWN STRL REUS W/ TWL LRG LVL3 (GOWN DISPOSABLE) ×18 IMPLANT
GOWN STRL REUS W/ TWL XL LVL3 (GOWN DISPOSABLE) IMPLANT
GOWN STRL REUS W/TWL LRG LVL3 (GOWN DISPOSABLE) ×18
GOWN STRL REUS W/TWL XL LVL3 (GOWN DISPOSABLE) ×3
INSERT FOGARTY XLG (MISCELLANEOUS) ×3 IMPLANT
KIT BASIN OR (CUSTOM PROCEDURE TRAY) ×3 IMPLANT
KIT SUT CK MINI COMBO 4X17 (Prosthesis & Implant Heart) IMPLANT
KIT TURNOVER KIT B (KITS) ×3 IMPLANT
LINE VENT (MISCELLANEOUS) IMPLANT
NS IRRIG 1000ML POUR BTL (IV SOLUTION) ×18 IMPLANT
ORGANIZER SUTURE GABBAY-FRATER (MISCELLANEOUS) ×3 IMPLANT
PACK E OPEN HEART (SUTURE) ×3 IMPLANT
PACK OPEN HEART (CUSTOM PROCEDURE TRAY) ×3 IMPLANT
PAD ARMBOARD 7.5X6 YLW CONV (MISCELLANEOUS) ×6 IMPLANT
PAD ELECT DEFIB RADIOL ZOLL (MISCELLANEOUS) ×3 IMPLANT
PENCIL BUTTON HOLSTER BLD 10FT (ELECTRODE) ×3 IMPLANT
POSITIONER HEAD DONUT 9IN (MISCELLANEOUS) ×3 IMPLANT
RING ANNULOPLASTY SIMULUS 34 (Prosthesis & Implant Heart) IMPLANT
SET MPS 3-ND DEL (MISCELLANEOUS) IMPLANT
SOLUTION ANTFG W/FOAM PAD STRL (MISCELLANEOUS) ×3 IMPLANT
SUT ETHIBOND 3 0 SH 1 (SUTURE) IMPLANT
SUT GORETEX CV 4 TH 22 36 (SUTURE) IMPLANT
SUT GORETEX CV-5 PH-17 (SUTURE) IMPLANT
SUT MNCRL AB 4-0 PS2 18 (SUTURE) ×6 IMPLANT
SUT PROLENE 4 0 RB 1 (SUTURE) ×6
SUT PROLENE 4 0 SH DA (SUTURE) ×9 IMPLANT
SUT PROLENE 4-0 RB1 .5 CRCL 36 (SUTURE) ×6 IMPLANT
SUT PROLENE 5 0 C 1 36 (SUTURE) IMPLANT
SUT PROLENE 5 0 RB 2 (SUTURE) IMPLANT
SUT STEEL 6MS V (SUTURE) ×3 IMPLANT
SUT STEEL SZ 6 DBL 3X14 BALL (SUTURE) ×6 IMPLANT
SUT TEM PAC WIRE 2 0 SH (SUTURE) IMPLANT
SUT VIC AB 0 CTX 36 (SUTURE) ×6
SUT VIC AB 0 CTX36XBRD ANTBCTR (SUTURE) ×6 IMPLANT
SUT VIC AB 2-0 CT1 27 (SUTURE) ×6
SUT VIC AB 2-0 CT1 TAPERPNT 27 (SUTURE) ×6 IMPLANT
SYR BULB IRRIG 60ML STRL (SYRINGE) IMPLANT
SYSTEM SAHARA CHEST DRAIN ATS (WOUND CARE) ×3 IMPLANT
TAG SUTURE CLAMP YLW 5PR (MISCELLANEOUS) ×3
TAPE CLOTH SURG 4X10 WHT LF (GAUZE/BANDAGES/DRESSINGS) IMPLANT
TAPE PAPER 2X10 WHT MICROPORE (GAUZE/BANDAGES/DRESSINGS) IMPLANT
TOWEL GREEN STERILE (TOWEL DISPOSABLE) ×3 IMPLANT
TOWEL GREEN STERILE FF (TOWEL DISPOSABLE) ×3 IMPLANT
TRAY FOLEY SLVR 16FR TEMP STAT (SET/KITS/TRAYS/PACK) ×3 IMPLANT
TUBE CONNECTING 12X1/4 (SUCTIONS) IMPLANT
UNDERPAD 30X36 HEAVY ABSORB (UNDERPADS AND DIAPERS) ×3 IMPLANT
VENT LEFT HEART 12002 (CATHETERS) ×3
VRC MALLEABLE SINGLE STG 36FR (MISCELLANEOUS) ×3
WATER STERILE IRR 1000ML POUR (IV SOLUTION) ×6 IMPLANT

## 2022-12-08 NOTE — Anesthesia Procedure Notes (Signed)
Procedure Name: Intubation Date/Time: 12/08/2022 9:07 AM  Performed by: Secor Nation, MDPre-anesthesia Checklist: Patient identified, Emergency Drugs available, Suction available and Patient being monitored Patient Re-evaluated:Patient Re-evaluated prior to induction Oxygen Delivery Method: Circle System Utilized Preoxygenation: Pre-oxygenation with 100% oxygen Induction Type: IV induction Ventilation: Mask ventilation without difficulty Laryngoscope Size: Miller and 3 Grade View: Grade I Tube type: Oral Tube size: 8.0 mm Number of attempts: 1 Airway Equipment and Method: Stylet and Oral airway Placement Confirmation: ETT inserted through vocal cords under direct vision, positive ETCO2 and breath sounds checked- equal and bilateral Tube secured with: Tape Dental Injury: Teeth and Oropharynx as per pre-operative assessment

## 2022-12-08 NOTE — Procedures (Signed)
Extubation Procedure Note  Patient Details:   Name: James Ibarra DOB: May 12, 1950 MRN: [id number]   Airway Documentation:    Vent end date: 12/08/22 Vent end time: 1559   Evaluation  O2 sats: stable throughout Complications: No apparent complications Patient did tolerate procedure well. Bilateral Breath Sounds: Clear, Diminished   Yes  Patient was extubated to a 6L [place] HFNC with Dr. Katrinka Blazing and RN at the bedside. Rapid wean protocol was not followed per Dr. Katrinka Blazing. Patient opens his eyes when his name is called & will occasionally wiggle his toes on command, all other commands are limited at this time (Dr. Katrinka Blazing was aware). Positive cuff leak prior to extubation.   [name], Margaretmary Dys 12/08/2022, 3:59 PM

## 2022-12-08 NOTE — Anesthesia Procedure Notes (Signed)
Central Venous Catheter Insertion Performed by: Hamlet Nation, MD, anesthesiologist Start/End10/06/2022 7:10 AM, 12/08/2022 7:25 AM Patient location: Pre-op. Preanesthetic checklist: patient identified, IV checked, site marked, risks and benefits discussed, surgical consent, monitors and equipment checked, pre-op evaluation, timeout performed and anesthesia consent Position: Trendelenburg Lidocaine 1% used for infiltration and patient sedated Hand hygiene performed , maximum sterile barriers used  and Seldinger technique used Catheter size: 8.5 Fr Total catheter length 10. Central line was placed.MAC introducer Swan type:thermodilution PA Cath depth:50 Procedure performed using ultrasound guided technique. Ultrasound Notes:anatomy identified, needle tip was noted to be adjacent to the nerve/plexus identified, no ultrasound evidence of intravascular and/or intraneural injection and image(s) printed for medical record Attempts: 1 Following insertion, line sutured and dressing applied. Post procedure assessment: blood return through all ports, free fluid flow and no air  Patient tolerated the procedure well with no immediate complications.

## 2022-12-08 NOTE — Brief Op Note (Signed)
12/08/2022  10:43 AM  PATIENT:  James Ibarra  72 y.o. male  PRE-OPERATIVE DIAGNOSIS:  SEVERE MITRAL REGURGITATION,  ATRIAL FIBRILATION  POST-OPERATIVE DIAGNOSIS:  SEVERE MITRAL REGURGITATION,  ATRIAL FIBRILATION  PROCEDURE:   MITRAL VALVE REPAIR (MVR) USING 34 MM SIMULUS SEMI- RIGID ANNULOPLASTY BAND   CLIPPING OF ATRIAL APPENDAGE (LAA) USING 50 MM ATRICLIP  MAZE PROCEDURE  TRANSESOPHAGEAL ECHOCARDIOGRAM   SURGEON: Eugenio Hoes, MD - Primary  PHYSICIAN ASSISTANT: Taggert Bozzi  ASSISTANTS: Maricela Curet, RN, Scrub Person                     Ria Bush, RN, Scrub Person   ANESTHESIA:   general  EBL:   BLOOD ADMINISTERED:none  DRAINS:  Mediastinal and pleural drains    LOCAL MEDICATIONS USED:  NONE  SPECIMEN:  Portion of posterior mitral leaflet  DISPOSITION OF SPECIMEN:  PATHOLOGY  COUNTS:  Correct  DICTATION: .Dragon Dictation  PLAN OF CARE: Admit to inpatient   PATIENT DISPOSITION:  ICU - intubated and hemodynamically stable.   Delay start of Pharmacological VTE agent (>24hrs) due to surgical blood loss or risk of bleeding: yes

## 2022-12-08 NOTE — Hospital Course (Signed)
PCP:  James Has, MD  Cardilogist:  James Ibarra  History of Present Illness:     Pt is a very pleasant 72 yo male who was found to have a heart murmur by his PCP this spring and underwent TTE which showed severe MR and preserved LV function. Pt was set up to see cardiology however developed acute SOB and was admitted with a pneumonia and atrial fibrillation. Pt was controlled medically and improved and discharged on NOAC. 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 Pt had a cath with no PHTN but elevated LVEDP. He had no CAD. Pt had a holter monitor without afib. Pt continues to feel palpitations especially lying on left side and is DOE.  James Ibarra Ibarra NYHA class 2 symptoms of severe MR from posterior prolapse into the posterior commisure. Pt would benefit from surgery which would include maze procedure and clipping of LA appendage. I feel that with complex nature of repair would best be done by sternotomy. All the risks and goals and recovery of surgery were discussed and pt understands and wishes to proceed. Will need off eliquis for 5 days and will obtain routine preoperative testing.   Hospital Course: James Ibarra was admitted for elective surgery on 12/08/22 and taken to the OR where complex mitral valve repair was carried out along with a MAZE procedure and application of a left atrial clip.  After the procedure he separated from cardiopulmonary bypass without difficulty and was transferred to the ICU in stable condition.

## 2022-12-08 NOTE — Anesthesia Postprocedure Evaluation (Signed)
Anesthesia Post Note  Patient: James Ibarra  Procedure(s) Performed: MITRAL VALVE REPAIR (MVR) USING 34 MM SIMULUS SEMI- RIGID ANNULOPLASTY BAND (Chest) CLIPPING OF ATRIAL APPENDAGE (LAA) USING 50 MM ATRICLIP (Left) MAZE TRANSESOPHAGEAL ECHOCARDIOGRAM     Patient location during evaluation: SICU Anesthesia Type: General Level of consciousness: sedated Pain management: pain level controlled Vital Signs Assessment: post-procedure vital signs reviewed and stable Respiratory status: patient remains intubated per anesthesia plan Cardiovascular status: stable Postop Assessment: no apparent nausea or vomiting Anesthetic complications: no   There were no known notable events for this encounter.  Last Vitals:  Vitals:   12/08/22 0605 12/08/22 1140  BP:  (!) 94/50  Pulse: (!) 49 80  Resp:  16  Temp:    SpO2:  100%    Last Pain:  Vitals:   12/08/22 0556  TempSrc:   PainSc: 0-No pain                 [place]k Nation

## 2022-12-08 NOTE — TOC Initial Note (Signed)
Transition of Care [facility]) - Initial/Assessment Note    Patient Details  Name: James Ibarra MRN: [id number] Date of Birth: 12/04/1950  [facility] CM/SW Contact:    Elliot Cousin, RN Phone Number: [phone] 12/08/2022, 5:12 PM  Clinical Narrative:   CM spoke to pt's wife at bedside. Pt was independent prior to admission. Will continue to follow as pt progresses.                 Expected Discharge Plan: Home/Self Care Barriers to Discharge: Continued Medical Work up   Patient Goals and CMS Choice Patient states their goals for this hospitalization and ongoing recovery are:: wants pt to recover CMS Medicare.gov Compare Post Acute Care list provided to:: Patient Represenative (must comment) Choice offered to / list presented to : Spouse      Expected Discharge Plan and Services   Discharge Planning Services: CM Consult Post Acute Care Choice: Home Health Living arrangements for the past 2 months: Single Family Home                                      Prior Living Arrangements/Services Living arrangements for the past 2 months: Single Family Home Lives with:: Spouse Patient language and need for interpreter reviewed:: Yes Do you feel safe going back to the place where you live?: Yes      Need for Family Participation in Patient Care: No (Comment) Care giver support system in place?: Yes (comment)   Criminal Activity/Legal Involvement Pertinent to Current Situation/Hospitalization: No - Comment as needed  Activities of Daily Living   ADL Screening (condition at time of admission) Independently performs ADLs?: Yes (appropriate for developmental age) Is the patient deaf or have difficulty hearing?: No Does the patient have difficulty seeing, even when wearing glasses/contacts?: No Does the patient have difficulty concentrating, remembering, or making decisions?: No  Permission Sought/Granted Permission sought to share information with :  Case Manager, Family Supports, PCP Permission granted to share information with : Yes, Verbal Permission Granted  Share Information with NAME: Valetta Fuller     Permission granted to share info w Relationship: wife  Permission granted to share info w Contact Information: [phone]  Emotional Assessment Appearance:: Appears stated age Attitude/Demeanor/Rapport: Engaged Affect (typically observed): Accepting Orientation: : Oriented to Self, Oriented to Place, Oriented to  Time, Oriented to Situation   Psych Involvement: No (comment)  Admission diagnosis:  S/P mitral valve repair [Z98.890] Patient Active Problem List   Diagnosis Date Noted   S/P mitral valve repair 12/08/2022   Protein-calorie malnutrition, severe 08/19/2022   Alcohol withdrawal (HCC) 08/14/2022   Acute metabolic encephalopathy 08/14/2022   Paroxysmal atrial fibrillation with RVR (HCC) 08/14/2022   Nonrheumatic mitral valve regurgitation 08/14/2022   Mitral valve prolapse 08/14/2022   PNA (pneumonia) 08/12/2022   Severe sepsis (HCC) 08/12/2022   HLD (hyperlipidemia) 08/12/2022   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 [place] [address] [place] [address] Phone: [phone] Fax: [phone]  [facility] [address] [place] -  [address] [place] [address] Phone: [phone] Fax: [phone]  [facility] - [place], [place] - [address] [place] [address] Phone: [phone] Fax: [phone]  [facility] - [place], [place] - [address] [place] [address] Phone: [phone] Fax: [phone]  [facility] [address] [place] [address] Phone: [phone] Fax: [phone]     Social Determinants of Health (SDOH) Social History: SDOH Screenings   Food Insecurity: No Food Insecurity (08/12/2022)  Housing: Low Risk  (08/12/2022)  Transportation Needs: No Transportation Needs (08/12/2022)  Utilities: Not At Risk (08/12/2022)  Tobacco Use: Low Risk  (12/08/2022)   SDOH Interventions:     Readmission Risk Interventions     No data to display

## 2022-12-08 NOTE — Progress Notes (Signed)
Patient became agitated and hypertensive. Patient intermittently follows commands. MD Katrinka Blazing notified. Orders to extubate. RT and MD Katrinka Blazing at bedside.  Order to obtain ABG 1 hour post extubation

## 2022-12-08 NOTE — Anesthesia Procedure Notes (Signed)
Arterial Line Insertion Start/End10/06/2022 7:00 AM, 12/08/2022 7:05 AM Performed by: [place] Nation, MD, anesthesiologist  Patient location: Pre-op. Preanesthetic checklist: patient identified, IV checked, site marked, risks and benefits discussed, surgical consent, monitors and equipment checked, pre-op evaluation, timeout performed and anesthesia consent Lidocaine 1% used for infiltration Left, radial was placed Catheter size: 20 G Hand hygiene performed  and maximum sterile barriers used   Attempts: 1 Procedure performed using ultrasound guided technique. Ultrasound Notes:image(s) printed for medical record Following insertion, dressing applied. Post procedure assessment: normal and unchanged  Patient tolerated the procedure well with no immediate complications.

## 2022-12-08 NOTE — Progress Notes (Signed)
  6 week TTE order s/p MV repair, MAZE, and left atrial clip.

## 2022-12-08 NOTE — Consult Note (Signed)
NAME:  James Ibarra, MRN:  [id number], DOB:  1950-03-23, LOS: 0 ADMISSION DATE:  12/08/2022, CONSULTATION DATE:  12/08/22 REFERRING MD:  Leafy Ro, CHIEF COMPLAINT:  SOB   History of Present Illness:  This is a 72 year old man who has a history of hypertension and hyperlipidemia who presented with shortness of breath, heart murmur.  During admission in June 2024 he was noted to have atrial fibrillation and significant mitral valve disease with preserved LV function.  Today he, he underwent mitral valve repair, maze, left atrial clipping.  150 EBL.  Arrived seen and intubated, sedated, drains in place.  Pulmonary critical care is to assist with postoperative management.  Pertinent  Medical History  Hypertension Hyperlipidemia Heart murmur  Significant Hospital Events: Including procedures, antibiotic start and stop dates in addition to other pertinent events   12/08/2022 mitral valve repair, atrial appendage clipping, maze  Interim History / Subjective:  Consult  Objective   Blood pressure (!) 94/50, pulse 80, temperature 97.7 F (36.5 C), temperature source Oral, resp. rate 16, height 6\' 1"  (1.854 m), weight 70.8 kg, SpO2 100%.    Vent Mode: SIMV;PSV;PRVC FiO2 (%):  [50 %] 50 % Set Rate:  [16 bmp] 16 bmp Vt Set:  [630 mL] 630 mL PEEP:  [5 cmH20] 5 cmH20 Pressure Support:  [10 cmH20] 10 cmH20 Plateau Pressure:  [13 cmH20] 13 cmH20   Intake/Output Summary (Last 24 hours) at 12/08/2022 1157 Last data filed at 12/08/2022 1108 Gross per 24 hour  Intake --  Output 575 ml  Net -575 ml   Filed Weights   12/08/22 0551  Weight: 70.8 kg    Examination: General: Intubated, sedated Ventilator HENT: Pupils pinpoint, ETT minimal secretions Lungs: Clear bilaterally, benign Vent mechanics, passive on vent Cardiovascular: Heart sounds are regular, extremities warm Abdomen: Soft, hypoactive bowel sounds Extremities: No appreciable edema Neuro: Currently still intubated and paralyzed GU:  Foley in place  Chest x-ray, CBC, coags personally reviewed.  He has some chronic lung changes that are better appreciated on recent CT scan.  Resolved Hospital Problem list   Not applicable  Assessment & Plan:  NYHA II symptomatic mitral regurgitation now status post repair History of atrial fibrillation now status post maze and atrial clipping Postoperative ventilator management History of hypertension  -Conservative transfusion threshold -Vent with rapid wean protocol -Avoid acidemia, hypothermia, coagulopathy -Drain management per primary - Insulin gtt targeting 140-180, transition when ready - Will follow while in ICU  Best Practice (right click and "Reselect all SmartList Selections" daily)   Diet/type: NPO DVT prophylaxis: SCD GI prophylaxis: PPI Lines: Central line Foley:  Yes, and it is still needed Code Status:  full code Last date of multidisciplinary goals of care discussion [per primary]  Labs   CBC: Recent Labs  Lab 12/06/22 1156 12/08/22 0756 12/08/22 0926 12/08/22 1000 12/08/22 1005 12/08/22 1038 12/08/22 1041  WBC 6.0  --   --   --   --   --   --   HGB 13.3   < > 8.5* 8.5* 8.4* 8.8* 8.8*  HCT 40.8   < > 25.0* 25.0* 26.1* 26.0* 26.0*  MCV 89.7  --   --   --   --   --   --   PLT 136*  --   --   --  89*  --   --    < > = values in this interval not displayed.    Basic Metabolic Panel: Recent Labs  Lab 12/06/22 1156  12/08/22 0756 12/08/22 8295 12/08/22 0841 12/08/22 0851 12/08/22 0856 12/08/22 0926 12/08/22 1000 12/08/22 1038 12/08/22 1041  NA 138 143   < > 141   < > 141 140 138 139 139  K 3.7 3.4*   < > 3.6   < > 3.5 4.0 4.0 3.7 3.7  CL 107 106  --  104  --   --  104  --  103  --   CO2 21*  --   --   --   --   --   --   --   --   --   GLUCOSE 94 103*  --  99  --   --  119*  --  146*  --   BUN 14 17  --  16  --   --  15  --  15  --   CREATININE 0.64 0.60*  --  0.60*  --   --  0.60*  --  0.60*  --   CALCIUM 8.6*  --   --   --   --   --    --   --   --   --    < > = values in this interval not displayed.   GFR: Estimated Creatinine Clearance: 83.6 mL/min (A) (by C-G formula based on SCr of 0.6 mg/dL (L)). Recent Labs  Lab 12/06/22 1156  WBC 6.0    Liver Function Tests: Recent Labs  Lab 12/06/22 1156  AST 21  ALT 20  ALKPHOS 49  BILITOT 0.6  PROT 6.1*  ALBUMIN 3.6   No results for input(s): "LIPASE", "AMYLASE" in the last 168 hours. No results for input(s): "AMMONIA" in the last 168 hours.  ABG    Component Value Date/Time   PHART 7.407 12/08/2022 1041   PCO2ART 38.0 12/08/2022 1041   PO2ART 196 (H) 12/08/2022 1041   HCO3 24.0 12/08/2022 1041   TCO2 25 12/08/2022 1041   ACIDBASEDEF 1.0 12/08/2022 1041   O2SAT 100 12/08/2022 1041     Coagulation Profile: Recent Labs  Lab 12/06/22 1156  INR 1.0    Cardiac Enzymes: No results for input(s): "CKTOTAL", "CKMB", "CKMBINDEX", "TROPONINI" in the last 168 hours.  HbA1C: Hgb A1c MFr Bld  Date/Time Value Ref Range Status  12/06/2022 11:55 AM 5.1 4.8 - 5.6 % Final    Comment:    (NOTE) Pre diabetes:          5.7%-6.4%  Diabetes:              >6.4%  Glycemic control for   <7.0% adults with diabetes     CBG: No results for input(s): "GLUCAP" in the last 168 hours.  Review of Systems:   Intubated/sedated  Past Medical History:  He,  has a past medical history of Cancer (HCC), Dysrhythmia, Heart murmur, History of nonmelanoma skin cancer, Hypercholesteremia, Hypertension, Pneumonia (08/2022), and Trigeminal neuralgia.   Surgical History:   Past Surgical History:  Procedure Laterality Date   CATARACT EXTRACTION W/ INTRAOCULAR LENS IMPLANT Bilateral    INGUINAL HERNIA REPAIR  11/07/2011   Procedure: LAPAROSCOPIC INGUINAL HERNIA;  Surgeon: Atilano Ina, MD,FACS;  Location: [facility];  Service: General;  Laterality: Left;   RETINAL DETACHMENT SURGERY  03/07/1995   RIGHT/LEFT HEART CATH AND CORONARY ANGIOGRAPHY N/A 10/12/2022   Procedure:  RIGHT/LEFT HEART CATH AND CORONARY ANGIOGRAPHY;  Surgeon: [name], MD;  Location: [facility];  Service: Cardiovascular;  Laterality: N/A;   SKIN CANCER  EXCISION  2012, 1999   basal cell - neck and hand   TEE WITHOUT CARDIOVERSION N/A 10/16/2022   Procedure: TRANSESOPHAGEAL ECHOCARDIOGRAM;  Surgeon: Christell Constant, MD;  Location: [facility];  Service: Cardiovascular;  Laterality: N/A;     Social History:   reports that he has never smoked. He has never used smokeless tobacco. He reports that he does not currently use alcohol. He reports that he does not use drugs.   Family History:  His family history includes Heart disease in his father and mother.   Allergies No Known Allergies   Home Medications  Prior to Admission medications   Medication Sig Start Date End Date Taking? Authorizing Provider  apixaban (ELIQUIS) 5 MG TABS tablet Take 1 tablet (5 mg total) by mouth 2 (two) times daily. 08/25/22  Yes Ghimire, Werner Lean, MD  Ascorbic Acid (VITAMIN C PO) Take 750 mg by mouth daily.   Yes [provider]  Cholecalciferol (VITAMIN D) 50 MCG (2000 UT) tablet Take 2,000 Units by mouth daily.   Yes [provider]  folic acid (FOLVITE) 1 MG tablet Take 1 mg by mouth daily.   Yes [provider]  gabapentin (NEURONTIN) 300 MG capsule TAKE 2 CAPSULES BY MOUTH 4 TIMES DAILY Patient taking differently: Take 300 mg by mouth 2 (two) times daily. 10/10/22  Yes Levert Feinstein, MD  hydrocortisone 2.5 % ointment Apply 1 Application topically 2 (two) times daily as needed (itching).   Yes [provider]  ketoconazole (NIZORAL) 2 % cream Apply 1 Application topically as needed for irritation. 01/16/13  Yes [provider]  Magnesium 400 MG TABS Take 400 mg by mouth daily.   Yes [provider]  melatonin 3 MG TABS tablet Take 3 mg by mouth at bedtime as needed (sleep).   Yes [provider]  metoprolol tartrate  (LOPRESSOR) 25 MG tablet Take 1 tablet (25 mg total) by mouth 2 (two) times daily. 08/25/22  Yes Ghimire, Werner Lean, MD  Multiple Vitamins-Minerals (PRESERVISION AREDS 2) CAPS Take 1 capsule by mouth 2 (two) times daily.   Yes [provider]  olmesartan (BENICAR) 20 MG tablet Take 20 mg by mouth daily.   Yes [provider]  Polyethyl Glycol-Propyl Glycol (SYSTANE OP) Place 1 drop into both eyes daily as needed (dry eyes).   Yes [provider]  pravastatin (PRAVACHOL) 10 MG tablet Take 10 mg by mouth at bedtime.   Yes [provider]  sildenafil (REVATIO) 20 MG tablet Take 40-60 mg by mouth daily as needed (ED).   Yes [provider]  thiamine (VITAMIN B-1) 100 MG tablet Take 100 mg by mouth daily.   Yes [provider]  traZODone (DESYREL) 100 MG tablet Take 100-150 mg by mouth at bedtime.   Yes [provider]     Critical care time: 31 mins

## 2022-12-08 NOTE — Interval H&P Note (Signed)
History and Physical Interval Note:  12/08/2022 6:26 AM  James Ibarra  has presented today for surgery, with the diagnosis of SEVERE MR AFIB.  The various methods of treatment have been discussed with the patient and family. After consideration of risks, benefits and other options for treatment, the patient has consented to  Procedure(s): MITRAL VALVE REPAIR (MVR) (N/A) CLIPPING OF ATRIAL APPENDAGE (LAA) (Left) MAZE (N/A) TRANSESOPHAGEAL ECHOCARDIOGRAM (N/A) as a surgical intervention.  The patient's history has been reviewed, patient examined, no change in status, stable for surgery.  I have reviewed the patient's chart and labs.  Questions were answered to the patient's satisfaction.     Eugenio Hoes

## 2022-12-08 NOTE — Op Note (Signed)
CARDIOVASCULAR SURGERY OPERATIVE NOTE  12/08/2022 AESON SAWYERS [id number]  Surgeon:  Ashley Akin, MD  First Assistant: Jillyn Hidden [facility]                               An experienced assistant was required given the complexity of this surgery and the standard of surgical care. The assistant was needed for exposure, dissection, suctioning, retraction of delicate tissues and sutures, instrument exchange and for overall help during this procedure.     Preoperative Diagnosis:  Severe Mitral Regurgitation for P3 flail                                             Atrial fibrillation                                              Patent Foramen Ovale Postoperative Diagnosis:  Same   Procedure: Complex mitral valve repair utilizing triangular resection of P3 with closure of the clefts btw the P2 and P3 segments and also btw P3 and commisural scallop and placement of a 34 mm Simulus Semi Rigid Band Pulmonary vein isolation Maze with closure of the left atrial appendage with a 50 mm Atriclip PFO closure  Anesthesia:  General Endotracheal   Clinical History/Surgical Indication: 72 yo male with NYHA class 2 symptoms of severe MR from posterior prolapse into the posterior commisure. Pt would benefit from surgery which would include maze procedure and clipping of LA appendage. I feel that with complex nature of repair would best be done by sternotomy.   Findings: The ventricle had normal function.  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  Following triangular resection and closure of clefts and placement of a ring there was no mitral rotation with a mean gradient of 2 mmHg.  Patient was in normal sinus rhythm.  Preparation:  The patient was seen in the preoperative holding area and the correct patient, correct operation were confirmed with the patient after reviewing the medical record and catheterization. The consent was signed by me. Preoperative  antibiotics were given. A pulmonary arterial line and radial arterial line were placed by the anesthesia team. The patient was taken back to the operating room and positioned supine on the operating room table. After being placed under general endotracheal anesthesia by the anesthesia team a foley catheter was placed. The neck, chest, abdomen, and both legs were prepped with betadine soap and solution and draped in the usual sterile manner. A surgical time-out was taken and the correct patient and operative procedure were confirmed with the nursing and anesthesia staff.  Operation: A median sternotomy incision was then created and sternal valve the sternal saw.  Pericardial well was developed and heparin delivered.  Aorta was cannulated with a 28 [place] Starns aortic cannula and a 36 French straight cannula was placed in the right atrial appendage and directed towards the inferior vena cava.  An additional 33 French right angle cannulas placed in the superior vena cava.  With adequate confirmation of anticoagulation cardiopulmonary bypass was instituted. The right pulmonary veins were then's surrounded with the Medtronic radiofrequency clamp and 3 sets of 2 burns  were created.  An antegrade cardioplegia catheter was then placed in the ascending aorta and aortic cross-clamp placed.  [place] blood cardioplegia was then delivered for the appropriate dose and a reanimation dose was delivered as part of cross-clamp removal. The left pulmonary veins were then encircled with the Medtronic radiofrequency clamp and 3 sets of 2 burns were created.  Left atrial appendage was occluded at its base with a 15 mm AtriClip. Left atrium was then opened in the intra-atrial groove and Exposure of the mitral valve was obtained.  Utilizing 3-0 Ethibond sutures, these were placed   in the posterior annulus in anticipation of a band.  The largest segment of P3 had its cleft closed between itself and P2 and following this a  triangular resection was created of the large segment of P3 and this was reapproximated with a running 5-0 Prolene suture.  The 34 mm stimulus semirigid band was then secured with the cor knot system.  Under saline load testing there appeared to still be some regurgitation and it was noted that there was a commissural scallop which was prolapsed and this was controlled by closing the cleft between the remnant P3 and this commissural scallop.  Again now under saline load test there was excellent coaptation of the leaflets with no regurgitation. The PFO was then noted and closed without simple 4-0 Prolene suture. The left atrium was then closed with a running 4-0 Prolene suture over ventricular sump.  The patient in headdown position aortic cross-clamp was removed. Ventricular and atrial pacing wires were placed about the inferior stab wounds and secured.  After adequate de-airing the ventricular sump was removed and the patient was weaned from cardiopulmonary bypass on inotropic support.  With adequate hemodynamics protamine was delivered and the patient was decannulated and sites oversewn were necessary.  Chest tubes were brought inferior stab was and secured the sternum was reapproximated with interrupted stainless steel wire and the presternal subcutaneous tissue and skin were closed multiple layers of absorbable suture.  Sterile dressings were applied.

## 2022-12-08 NOTE — Transfer of Care (Signed)
Immediate Anesthesia Transfer of Care Note  Patient: James Ibarra  Procedure(s) Performed: MITRAL VALVE REPAIR (MVR) USING 34 MM SIMULUS SEMI- RIGID ANNULOPLASTY BAND (Chest) CLIPPING OF ATRIAL APPENDAGE (LAA) USING 50 MM ATRICLIP (Left) MAZE TRANSESOPHAGEAL ECHOCARDIOGRAM  Patient Location: SICU  Anesthesia Type:General  Level of Consciousness: sedated  Airway & Oxygen Therapy: Patient remains intubated per anesthesia plan  Post-op Assessment: Report given to RN and Post -op Vital signs reviewed and stable  Post vital signs: Reviewed and stable  Last Vitals:  Vitals Value Taken Time  BP 94/50 12/08/22 1140  Temp    Pulse 80 12/08/22 1140  Resp 16 12/08/22 1153  SpO2 100 % 12/08/22 1140  Vitals shown include unfiled device data.  Last Pain:  Vitals:   12/08/22 0556  TempSrc:   PainSc: 0-No pain      Patients Stated Pain Goal: 0 (12/08/22 0556)  Complications: There were no known notable events for this encounter.

## 2022-12-08 NOTE — Discharge Summary (Signed)
Physician Discharge Summary  Patient ID: James Ibarra MRN: [id number] DOB/AGE: 03-27-1950 72 y.o.  Admit date: 12/08/2022 Discharge date: 12/08/2022  Admission Diagnoses:  Severe mitral insufficiency Paroxysmal atrial fibrillation Dyslipidemia  Discharge Diagnoses:   Severe mitral insufficiency Paroxysmal atrial fibrillation Dyslipidemia Status post mitral valve repair Status post Maze procedure Status post application of left atrial clip   Discharged Condition: {condition:18240}  PCP:  Farris Has, MD  Cardilogist:  Alverda Skeans  History of Present Illness:     Pt is a very pleasant 72 yo male who was found to have a heart murmur by his PCP this spring and underwent TTE which showed severe MR and preserved LV function. Pt was set up to see cardiology however developed acute SOB and was admitted with a pneumonia and atrial fibrillation. Pt was controlled medically and improved and discharged on NOAC. 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 Pt had a cath with no PHTN but elevated LVEDP. He had no CAD. Pt had a holter monitor without afib. Pt continues to feel palpitations especially lying on left side and is DOE.  James Ibarra has NYHA class 2 symptoms of severe MR from posterior prolapse into the posterior commisure. Pt would benefit from surgery which would include maze procedure and clipping of LA appendage. I feel that with complex nature of repair would best be done by sternotomy. All the risks and goals and recovery of surgery were discussed and pt understands and wishes to proceed. Will need off eliquis for 5 days and will obtain routine preoperative testing.   Hospital Course: James Ibarra was admitted for elective surgery on 12/08/22 and taken to the OR where complex mitral valve  repair was carried out along with a MAZE procedure and application of a left atrial clip.  After the procedure he separated from cardiopulmonary bypass without difficulty and was transferred to the ICU in stable condition.   Consults: pulmonary/intensive care  Significant Diagnostic Studies:   TRANSESOPHOGEAL ECHO REPORT       Patient Name:   James Ibarra Date of Exam: 10/16/2022  Medical Rec #:  [id number]       Height:       74.0 in  Accession #:    [id number]      Weight:       150.0 lb  Date of Birth:  06-10-1950       BSA:          1.923 m  Patient Age:    72 years        BP:           170/82 mmHg  Patient Gender: M               HR:           56 bpm.  Exam Location:  Inpatient   Procedure: Transesophageal Echo, 3D Echo, Color Doppler and Cardiac  Doppler   Indications:   Mitral Regurgitation i34.0    History:        Patient has prior history of Echocardiogram examinations,  most                 recent 06/30/2022. Arrythmias:Atrial Fibrillation; Risk  Factors:Hypertension and Dyslipidemia.    Sonographer:    Irving Burton Senior RDCS  Referring Phys: Alverda Skeans, K   PROCEDURE: After discussion of the risks and benefits of a TEE, an  informed consent was obtained from the patient. The transesophogeal probe  was passed without difficulty through the esophogus of the patient.  Sedation performed by different physician.  The patient was monitored while under deep sedation. Anesthestetic  sedation was provided intravenously by Anesthesiology: 248mg  of Propofol,  100mg  of Lidocaine. The patient developed no complications during the  procedure.    IMPRESSIONS     1. 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  Systolic blunting of the pulmonary veins. 3D Vena contracta 0.387 cm2.  Rotated cardiac silhouette. The mitral valve is myxomatous. Moderate to  severe mitral valve regurgitation.   2. Left ventricular ejection fraction, by  estimation, is 60%. The left  ventricle has normal function.   3. Right ventricular systolic function is normal. The right ventricular  size is normal.   4. Left atrial size was mildly dilated. No left atrial/left atrial  appendage thrombus was detected.   5. Right atrial size was mildly dilated.   6. The aortic valve is tricuspid. Aortic valve regurgitation is not  visualized. No aortic stenosis is present.   7. There is mild (Grade II) plaque involving the descending aorta.   8. Small PFO.   FINDINGS   Left Ventricle: Left ventricular ejection fraction, by estimation, is  60%. The left ventricle has normal function. The left ventricular internal  cavity size was normal in size.   Right Ventricle: The right ventricular size is normal. No increase in  right ventricular wall thickness. Right ventricular systolic function is  normal.   Left Atrium: Left atrial size was mildly dilated. No left atrial/left  atrial appendage thrombus was detected.   Right Atrium: Right atrial size was mildly dilated.   Pericardium: There is no evidence of pericardial effusion.   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 Systolic blunting of the pulmonary veins. 3D Vena contracta  0.387 cm2. Rotated cardiac silhouette. The mitral valve is myxomatous.  Moderate to severe mitral valve  regurgitation.   Tricuspid Valve: The tricuspid valve is normal in structure. Tricuspid  valve regurgitation is mild.   Aortic Valve: The aortic valve is tricuspid. Aortic valve regurgitation is  not visualized. No aortic stenosis is present.   Pulmonic Valve: The pulmonic valve was normal in structure. Pulmonic valve  regurgitation is mild to moderate. No evidence of pulmonic stenosis.   Aorta: The aortic root, ascending aorta, aortic arch and descending aorta  are all structurally normal, with no evidence of dilitation or  obstruction. There is mild (Grade II) plaque  involving the descending  aorta.   IAS/Shunts: Small PFO.   Additional Comments: Spectral Doppler performed.     3D Volume EF  LV 3D EDV:   81.18 ml  LV 3D ESV:   35.83 ml   [name] MD  Electronically signed by [name] MD  Signature Date/Time: 10/16/2022/3:23:46 PM      Treatments: {Tx:18249}  Discharge Exam: Blood pressure (!) 147/79, pulse (!) 49, temperature 97.7 F (36.5 C), temperature source Oral, resp. rate 17, height 6\' 1"  (1.854 m), weight 70.8 kg, SpO2 98%. {physical ZOXW:9604540}  Disposition:    Allergies as of 12/08/2022   No Known Allergies   Med Rec must be completed prior to using this [facility]***  The patient has been discharged on:   1.Beta Blocker:  Yes [   ]                              No   [   ]                              If No, reason:  2.Ace Inhibitor/ARB: Yes [   ]                                     No  [    ]                                     If No, reason:  3.Statin:   Yes [   ]                  No  [   ]                  If No, reason:  4.Ecasa:  Yes  [   ]                  No   [   ]                  If No, reason:  5. ACS on Admission?  P2Y12 Inhibitor:  Yes  [   ]                                No  [  ]    Signed: Valia Wingard G. Adianna Darwin 12/08/2022, 10:58 AM

## 2022-12-08 NOTE — Progress Notes (Signed)
  Echocardiogram Echocardiogram Transesophageal has been performed.  Delcie Roch 12/08/2022, 9:11 AM

## 2022-12-09 ENCOUNTER — Inpatient Hospital Stay (HOSPITAL_COMMUNITY): Payer: Medicare Other

## 2022-12-09 DIAGNOSIS — Z9889 Other specified postprocedural states: Secondary | ICD-10-CM | POA: Diagnosis not present

## 2022-12-09 LAB — BASIC METABOLIC PANEL
Anion gap: 9 (ref 5–15)
Anion gap: 8 (ref 5–15)
BUN: 12 mg/dL (ref 8–23)
BUN: 10 mg/dL (ref 8–23)
CO2: 25 mmol/L (ref 22–32)
CO2: 25 mmol/L (ref 22–32)
Calcium: 7.8 mg/dL — ABNORMAL LOW (ref 8.9–10.3)
Calcium: 8.3 mg/dL — ABNORMAL LOW (ref 8.9–10.3)
Chloride: 104 mmol/L (ref 98–111)
Chloride: 102 mmol/L (ref 98–111)
Creatinine, Ser: 0.62 mg/dL (ref 0.61–1.24)
Creatinine, Ser: 0.77 mg/dL (ref 0.61–1.24)
GFR, Estimated: >60 mL/min (ref >60)
GFR, Estimated: >60 mL/min (ref >60)
Glucose, Bld: 118 mg/dL — ABNORMAL HIGH (ref 70–99)
Glucose, Bld: 135 mg/dL — ABNORMAL HIGH (ref 70–99)
Potassium: 3.7 mmol/L (ref 3.5–5.1)
Potassium: 3.9 mmol/L (ref 3.5–5.1)
Sodium: 138 mmol/L (ref 135–145)
Sodium: 135 mmol/L (ref 135–145)

## 2022-12-09 LAB — GLUCOSE, CAPILLARY
Glucose-Capillary: 145 mg/dL — ABNORMAL HIGH (ref 70–99)
Glucose-Capillary: 126 mg/dL — ABNORMAL HIGH (ref 70–99)
Glucose-Capillary: 117 mg/dL — ABNORMAL HIGH (ref 70–99)
Glucose-Capillary: 127 mg/dL — ABNORMAL HIGH (ref 70–99)
Glucose-Capillary: 129 mg/dL — ABNORMAL HIGH (ref 70–99)
Glucose-Capillary: 119 mg/dL — ABNORMAL HIGH (ref 70–99)
Glucose-Capillary: 113 mg/dL — ABNORMAL HIGH (ref 70–99)
Glucose-Capillary: 118 mg/dL — ABNORMAL HIGH (ref 70–99)
Glucose-Capillary: 130 mg/dL — ABNORMAL HIGH (ref 70–99)

## 2022-12-09 LAB — CREATININE, SERUM
Creatinine, Ser: 0.82 mg/dL (ref 0.61–1.24)
GFR, Estimated: >60 mL/min (ref >60)

## 2022-12-09 LAB — CBC
HCT: 27.5 % — ABNORMAL LOW (ref 39.0–52.0)
HCT: 30.4 % — ABNORMAL LOW (ref 39.0–52.0)
Hemoglobin: 9 g/dL — ABNORMAL LOW (ref 13.0–17.0)
Hemoglobin: 9.9 g/dL — ABNORMAL LOW (ref 13.0–17.0)
MCH: 29.9 pg (ref 26.0–34.0)
MCH: 29.9 pg (ref 26.0–34.0)
MCHC: 32.7 g/dL (ref 30.0–36.0)
MCHC: 32.6 g/dL (ref 30.0–36.0)
MCV: 91.4 fL (ref 80.0–100.0)
MCV: 91.8 fL (ref 80.0–100.0)
Platelets: 88 10*3/uL — ABNORMAL LOW (ref 150–400)
Platelets: 102 10*3/uL — ABNORMAL LOW (ref 150–400)
RBC: 3.01 MIL/uL — ABNORMAL LOW (ref 4.22–5.81)
RBC: 3.31 MIL/uL — ABNORMAL LOW (ref 4.22–5.81)
RDW: 14 % (ref 11.5–15.5)
RDW: 14.4 % (ref 11.5–15.5)
WBC: 9.2 10*3/uL (ref 4.0–10.5)
WBC: 12.5 10*3/uL — ABNORMAL HIGH (ref 4.0–10.5)
nRBC: 0 % (ref 0.0–0.2)
nRBC: 0 % (ref 0.0–0.2)

## 2022-12-09 LAB — MAGNESIUM
Magnesium: 2.1 mg/dL (ref 1.7–2.4)
Magnesium: 2.1 mg/dL (ref 1.7–2.4)

## 2022-12-09 MED ORDER — INSULIN ASPART 100 UNIT/ML IJ SOLN: 0.0000 [IU] | INTRAMUSCULAR | Status: DC

## 2022-12-09 MED ORDER — INSULIN ASPART 100 UNIT/ML IJ SOLN
0.0000 [IU] | INTRAMUSCULAR | Status: DC
  Administered 2022-12-09 – 2022-12-11 (×7): 2 [IU] via SUBCUTANEOUS

## 2022-12-09 MED ORDER — ENOXAPARIN SODIUM 40 MG/0.4ML IJ SOSY
40.0000 mg | PREFILLED_SYRINGE | Freq: Every day | INTRAMUSCULAR | Status: DC
  Administered 2022-12-09 – 2022-12-10 (×2): 40 mg via SUBCUTANEOUS
  Filled 2022-12-09 (×2): qty 0.4

## 2022-12-09 NOTE — Progress Notes (Signed)
   NAME:  James Ibarra, MRN:  [id number], DOB:  1950-10-10, LOS: 1 ADMISSION DATE:  12/08/2022, CONSULTATION DATE:  12/08/22 REFERRING MD:  Leafy Ro, CHIEF COMPLAINT:  SOB   History of Present Illness:  This is a 72 year old man who has a history of hypertension and hyperlipidemia who presented with shortness of breath, heart murmur.  During admission in June 2024 he was noted to have atrial fibrillation and significant mitral valve disease with preserved LV function.  Today he, he underwent mitral valve repair, maze, left atrial clipping.  150 EBL.  Arrived seen and intubated, sedated, drains in place.  Pulmonary critical care is to assist with postoperative management.  Pertinent  Medical History  Hypertension Hyperlipidemia Heart murmur  Significant Hospital Events: Including procedures, antibiotic start and stop dates in addition to other pertinent events   12/08/2022 mitral valve repair, atrial appendage clipping, maze  Interim History / Subjective:  A little confused; otherwise pain under control.  Objective   Blood pressure 113/67, pulse 72, temperature 99.7 F (37.6 C), resp. rate 15, height 6\' 1"  (1.854 m), weight 73.8 kg, SpO2 99%. PAP: (8-42)/(-4-18) 24/7 CO:  [4.2 L/min-8.3 L/min] 8.3 L/min CI:  [2.19 L/min/m2-4.27 L/min/m2] 4.27 L/min/m2  Vent Mode: CPAP;PSV FiO2 (%):  [50 %] 50 % Set Rate:  [16 bmp] 16 bmp Vt Set:  [630 mL] 630 mL PEEP:  [5 cmH20] 5 cmH20 Pressure Support:  [8 cmH20-10 cmH20] 8 cmH20 Plateau Pressure:  [13 cmH20] 13 cmH20   Intake/Output Summary (Last 24 hours) at 12/09/2022 1052 Last data filed at 12/09/2022 1000 Gross per 24 hour  Intake 2001.51 ml  Output 2530 ml  Net -528.49 ml   Filed Weights   12/08/22 0551 12/09/22 0500  Weight: 70.8 kg 73.8 kg    Examination: No distress Pupils equal Breath sounds clear Minimal chest drain output Abd soft Moves to command RASS 0 Aox2  BMP ok Stable anemia and thrombocytopenia CXR some  increase in BL pleural effusions  Resolved Hospital Problem list   Postoperative ventilator management  Assessment & Plan:  NYHA II symptomatic mitral regurgitation now status post repair History of atrial fibrillation now status post maze and atrial clipping History of hypertension Mild ICU delirium  - Encourage day/night cycles, family visits, re-orient PRN - AC resumption when cleared by TCTS - GDMT and line removal per TCTS - Encourage IS, braced coughing, multimodal pain strategy - Will follow with you while in ICU  Myrla Halsted MD PCCM

## 2022-12-09 NOTE — Progress Notes (Signed)
      [address]       [place] [address]             [phone]                 1 Day Post-Op Procedure(s) (LRB): MITRAL VALVE REPAIR (MVR) USING 34 MM SIMULUS SEMI- RIGID ANNULOPLASTY BAND (N/A) CLIPPING OF ATRIAL APPENDAGE (LAA) USING 50 MM ATRICLIP (Left) MAZE (N/A) TRANSESOPHAGEAL ECHOCARDIOGRAM (N/A)   Events: No events extubated _______________________________________________________________ Vitals: BP 113/67   Pulse 72   Temp 99.7 F (37.6 C)   Resp 15   Ht 6\' 1"  (1.854 m)   Wt 73.8 kg   SpO2 99%   BMI 21.47 kg/m  Filed Weights   12/08/22 0551 12/09/22 0500  Weight: 70.8 kg 73.8 kg     - Neuro: alert NAD  - Cardiovascular: sinus  Drips: none.   PAP: (8-42)/(-4-18) 24/7 CO:  [4.2 L/min-8.3 L/min] 8.3 L/min CI:  [2.19 L/min/m2-4.27 L/min/m2] 4.27 L/min/m2  - Pulm: EWOB  ABG    Component Value Date/Time   PHART 7.348 (L) 12/08/2022 1700   PCO2ART 39.3 12/08/2022 1700   PO2ART 90 12/08/2022 1700   HCO3 21.3 12/08/2022 1700   TCO2 22 12/08/2022 1700   ACIDBASEDEF 4.0 (H) 12/08/2022 1700   O2SAT 96 12/08/2022 1700    - Abd: ND - Extremity: warm  .Intake/Output      10/04 0701 10/05 0700 10/05 0701 10/06 0700   I.V. (mL/kg) 553.1 (7.5) 32.9 (0.4)   IV Piggyback 1390.3 25.3   Total Intake(mL/kg) 1943.4 (26.3) 58.2 (0.8)   Urine (mL/kg/hr) 1940 (1.1) 110 (0.6)   Chest Tube 800 50   Total Output 2740 160   Net -796.7 -101.8           _______________________________________________________________ Labs:    Latest Ref Rng & Units 12/09/2022    2:38 AM 12/08/2022    6:26 PM 12/08/2022    5:00 PM  CBC  WBC 4.0 - 10.5 K/uL 9.2  9.3    Hemoglobin 13.0 - 17.0 g/dL 9.0  9.4  9.5   Hematocrit 39.0 - 52.0 % 27.5  29.3  28.0   Platelets 150 - 400 K/uL 88  88        Latest Ref Rng & Units 12/09/2022    2:38 AM 12/08/2022    6:26 PM 12/08/2022    5:00 PM  CMP  Glucose 70 - 99 mg/dL 914  782    BUN 8 - 23 mg/dL 12  13     Creatinine 9.56 - 1.24 mg/dL 2.13  0.86    Sodium 578 - 145 mmol/L 138  139  142   Potassium 3.5 - 5.1 mmol/L 3.7  3.5  3.5   Chloride 98 - 111 mmol/L 104  107    CO2 22 - 32 mmol/L 25  23    Calcium 8.9 - 10.3 mg/dL 7.8  7.9      CXR: PV congestion  _______________________________________________________________  Assessment and Plan: POD 1 s/p MVRep, MAZE atriclip  Neuro: adjusting pain meds CV: on A/S/BB.  On amio.  Will remove A-line and swan Pulm: will keep chest tubes Renal: creat stable GI: on diet Heme: stable ID: afebrile Endo: SSI Dispo: continue ICU care.   James Ibarra 12/09/2022 9:39 AM

## 2022-12-09 NOTE — Plan of Care (Signed)
  Problem: Fluid Volume: Goal: Hemodynamic stability will improve Outcome: Progressing   Problem: Clinical Measurements: Goal: Diagnostic test results will improve Outcome: Progressing Goal: Signs and symptoms of infection will decrease Outcome: Progressing   Problem: Respiratory: Goal: Ability to maintain adequate ventilation will improve Outcome: Progressing   Problem: Education: Goal: Understanding of CV disease, CV risk reduction, and recovery process will improve Outcome: Progressing Goal: Individualized Educational Video(s) Outcome: Progressing   Problem: Activity: Goal: Ability to return to baseline activity level will improve Outcome: Progressing   Problem: Cardiovascular: Goal: Ability to achieve and maintain adequate cardiovascular perfusion will improve Outcome: Progressing Goal: Vascular access site(s) Level 0-1 will be maintained Outcome: Progressing   Problem: Health Behavior/Discharge Planning: Goal: Ability to safely manage health-related needs after discharge will improve Outcome: Progressing   Problem: Education: Goal: Knowledge of General Education information will improve Description: Including pain rating scale, medication(s)/side effects and non-pharmacologic comfort measures Outcome: Progressing   Problem: Health Behavior/Discharge Planning: Goal: Ability to manage health-related needs will improve Outcome: Progressing   Problem: Clinical Measurements: Goal: Ability to maintain clinical measurements within normal limits will improve Outcome: Progressing Goal: Will remain free from infection Outcome: Progressing Goal: Diagnostic test results will improve Outcome: Progressing Goal: Respiratory complications will improve Outcome: Progressing Goal: Cardiovascular complication will be avoided Outcome: Progressing   Problem: Activity: Goal: Risk for activity intolerance will decrease Outcome: Progressing   Problem: Nutrition: Goal: Adequate  nutrition will be maintained Outcome: Progressing   Problem: Coping: Goal: Level of anxiety will decrease Outcome: Progressing   Problem: Elimination: Goal: Will not experience complications related to bowel motility Outcome: Progressing Goal: Will not experience complications related to urinary retention Outcome: Progressing   Problem: Pain Managment: Goal: General experience of comfort will improve Outcome: Progressing   Problem: Safety: Goal: Ability to remain free from injury will improve Outcome: Progressing   Problem: Skin Integrity: Goal: Risk for impaired skin integrity will decrease Outcome: Progressing   Problem: Education: Goal: Will demonstrate proper wound care and an understanding of methods to prevent future damage Outcome: Progressing Goal: Knowledge of disease or condition will improve Outcome: Progressing Goal: Knowledge of the prescribed therapeutic regimen will improve Outcome: Progressing Goal: Individualized Educational Video(s) Outcome: Progressing   Problem: Activity: Goal: Risk for activity intolerance will decrease Outcome: Progressing   Problem: Cardiac: Goal: Will achieve and/or maintain hemodynamic stability Outcome: Progressing   Problem: Clinical Measurements: Goal: Postoperative complications will be avoided or minimized Outcome: Progressing   Problem: Respiratory: Goal: Respiratory status will improve Outcome: Progressing   Problem: Skin Integrity: Goal: Wound healing without signs and symptoms of infection Outcome: Progressing Goal: Risk for impaired skin integrity will decrease Outcome: Progressing   Problem: Urinary Elimination: Goal: Ability to achieve and maintain adequate renal perfusion and functioning will improve Outcome: Progressing

## 2022-12-10 ENCOUNTER — Inpatient Hospital Stay (HOSPITAL_COMMUNITY): Payer: Medicare Other

## 2022-12-10 DIAGNOSIS — Z9889 Other specified postprocedural states: Secondary | ICD-10-CM | POA: Diagnosis not present

## 2022-12-10 LAB — GLUCOSE, CAPILLARY
Glucose-Capillary: 104 mg/dL — ABNORMAL HIGH (ref 70–99)
Glucose-Capillary: 120 mg/dL — ABNORMAL HIGH (ref 70–99)
Glucose-Capillary: 120 mg/dL — ABNORMAL HIGH (ref 70–99)
Glucose-Capillary: 121 mg/dL — ABNORMAL HIGH (ref 70–99)
Glucose-Capillary: 126 mg/dL — ABNORMAL HIGH (ref 70–99)
Glucose-Capillary: 86 mg/dL (ref 70–99)
Glucose-Capillary: 94 mg/dL (ref 70–99)

## 2022-12-10 LAB — BASIC METABOLIC PANEL
Anion gap: 7 (ref 5–15)
BUN: 7 mg/dL — ABNORMAL LOW (ref 8–23)
CO2: 26 mmol/L (ref 22–32)
Calcium: 8.3 mg/dL — ABNORMAL LOW (ref 8.9–10.3)
Chloride: 104 mmol/L (ref 98–111)
Creatinine, Ser: 0.57 mg/dL — ABNORMAL LOW (ref 0.61–1.24)
GFR, Estimated: >60 mL/min (ref >60)
Glucose, Bld: 119 mg/dL — ABNORMAL HIGH (ref 70–99)
Potassium: 3.6 mmol/L (ref 3.5–5.1)
Sodium: 137 mmol/L (ref 135–145)

## 2022-12-10 LAB — CBC
HCT: 26.9 % — ABNORMAL LOW (ref 39.0–52.0)
Hemoglobin: 8.9 g/dL — ABNORMAL LOW (ref 13.0–17.0)
MCH: 30.7 pg (ref 26.0–34.0)
MCHC: 33.1 g/dL (ref 30.0–36.0)
MCV: 92.8 fL (ref 80.0–100.0)
Platelets: 78 10*3/uL — ABNORMAL LOW (ref 150–400)
RBC: 2.9 MIL/uL — ABNORMAL LOW (ref 4.22–5.81)
RDW: 14.1 % (ref 11.5–15.5)
WBC: 9.4 10*3/uL (ref 4.0–10.5)
nRBC: 0 % (ref 0.0–0.2)

## 2022-12-10 MED ORDER — POTASSIUM CHLORIDE CRYS ER 20 MEQ PO TBCR
40.0000 meq | EXTENDED_RELEASE_TABLET | Freq: Every day | ORAL | Status: DC
  Administered 2022-12-10 – 2022-12-11 (×2): 40 meq via ORAL
  Filled 2022-12-10 (×2): qty 2

## 2022-12-10 MED ORDER — METOPROLOL TARTRATE 25 MG/10 ML ORAL SUSPENSION: 12.5000 mg | Freq: Two times a day (BID) | ORAL | Status: DC

## 2022-12-10 MED ORDER — METOPROLOL TARTRATE 50 MG PO TABS
50.0000 mg | ORAL_TABLET | Freq: Two times a day (BID) | ORAL | Status: DC
  Administered 2022-12-11 – 2022-12-12 (×4): 50 mg via ORAL
  Filled 2022-12-10 (×4): qty 1

## 2022-12-10 MED ORDER — MELATONIN 3 MG PO TABS
3.0000 mg | ORAL_TABLET | Freq: Every day | ORAL | Status: DC
  Administered 2022-12-10 – 2022-12-12 (×3): 3 mg via ORAL
  Filled 2022-12-10 (×3): qty 1

## 2022-12-10 MED ORDER — HYDRALAZINE HCL 20 MG/ML IJ SOLN: 10.0000 mg | INTRAMUSCULAR | Status: DC | PRN

## 2022-12-10 MED ORDER — POTASSIUM CHLORIDE CRYS ER 20 MEQ PO TBCR
20.0000 meq | EXTENDED_RELEASE_TABLET | ORAL | Status: DC
  Administered 2022-12-10: 20 meq via ORAL
  Filled 2022-12-10: qty 1

## 2022-12-10 MED ORDER — IRBESARTAN 75 MG PO TABS
37.5000 mg | ORAL_TABLET | Freq: Every day | ORAL | Status: DC
  Administered 2022-12-10 – 2022-12-14 (×5): 37.5 mg via ORAL
  Filled 2022-12-10 (×5): qty 0.5

## 2022-12-10 MED ORDER — MELATONIN 3 MG PO TABS: 3.0000 mg | ORAL_TABLET | Freq: Every evening | ORAL | Status: DC | PRN

## 2022-12-10 MED ORDER — TRAZODONE HCL 50 MG PO TABS
150.0000 mg | ORAL_TABLET | Freq: Every day | ORAL | Status: DC
  Administered 2022-12-10 – 2022-12-13 (×4): 150 mg via ORAL
  Filled 2022-12-10 (×3): qty 3
  Filled 2022-12-10: qty 1

## 2022-12-10 MED ORDER — METOPROLOL TARTRATE 25 MG PO TABS
25.0000 mg | ORAL_TABLET | Freq: Two times a day (BID) | ORAL | Status: DC
  Administered 2022-12-10: 25 mg via ORAL
  Filled 2022-12-10: qty 1

## 2022-12-10 MED ORDER — TRAZODONE HCL 50 MG PO TABS: 100.0000 mg | ORAL_TABLET | Freq: Every day | ORAL | Status: DC

## 2022-12-10 MED ORDER — FUROSEMIDE 40 MG PO TABS
40.0000 mg | ORAL_TABLET | Freq: Every day | ORAL | Status: DC
  Administered 2022-12-10 – 2022-12-11 (×2): 40 mg via ORAL
  Filled 2022-12-10 (×2): qty 1

## 2022-12-10 MED ORDER — METOPROLOL TARTRATE 50 MG PO TABS
50.0000 mg | ORAL_TABLET | Freq: Once | ORAL | Status: AC
  Administered 2022-12-10: 50 mg via ORAL
  Filled 2022-12-10: qty 1

## 2022-12-10 NOTE — Plan of Care (Signed)
  Problem: Fluid Volume: Goal: Hemodynamic stability will improve Outcome: Progressing   Problem: Clinical Measurements: Goal: Diagnostic test results will improve Outcome: Progressing Goal: Signs and symptoms of infection will decrease Outcome: Progressing   Problem: Respiratory: Goal: Ability to maintain adequate ventilation will improve Outcome: Progressing   Problem: Education: Goal: Understanding of CV disease, CV risk reduction, and recovery process will improve Outcome: Progressing Goal: Individualized Educational Video(s) Outcome: Progressing   Problem: Activity: Goal: Ability to return to baseline activity level will improve Outcome: Progressing   Problem: Cardiovascular: Goal: Ability to achieve and maintain adequate cardiovascular perfusion will improve Outcome: Progressing Goal: Vascular access site(s) Level 0-1 will be maintained Outcome: Progressing   Problem: Health Behavior/Discharge Planning: Goal: Ability to safely manage health-related needs after discharge will improve Outcome: Progressing   Problem: Education: Goal: Knowledge of General Education information will improve Description: Including pain rating scale, medication(s)/side effects and non-pharmacologic comfort measures Outcome: Progressing   Problem: Health Behavior/Discharge Planning: Goal: Ability to manage health-related needs will improve Outcome: Progressing   Problem: Clinical Measurements: Goal: Ability to maintain clinical measurements within normal limits will improve Outcome: Progressing Goal: Will remain free from infection Outcome: Progressing Goal: Diagnostic test results will improve Outcome: Progressing Goal: Respiratory complications will improve Outcome: Progressing Goal: Cardiovascular complication will be avoided Outcome: Progressing   Problem: Activity: Goal: Risk for activity intolerance will decrease Outcome: Progressing   Problem: Nutrition: Goal: Adequate  nutrition will be maintained Outcome: Progressing   Problem: Coping: Goal: Level of anxiety will decrease Outcome: Progressing   Problem: Elimination: Goal: Will not experience complications related to bowel motility Outcome: Progressing Goal: Will not experience complications related to urinary retention Outcome: Progressing   Problem: Pain Managment: Goal: General experience of comfort will improve Outcome: Progressing   Problem: Safety: Goal: Ability to remain free from injury will improve Outcome: Progressing   Problem: Skin Integrity: Goal: Risk for impaired skin integrity will decrease Outcome: Progressing   Problem: Education: Goal: Will demonstrate proper wound care and an understanding of methods to prevent future damage Outcome: Progressing Goal: Knowledge of disease or condition will improve Outcome: Progressing Goal: Knowledge of the prescribed therapeutic regimen will improve Outcome: Progressing Goal: Individualized Educational Video(s) Outcome: Progressing   Problem: Activity: Goal: Risk for activity intolerance will decrease Outcome: Progressing   Problem: Cardiac: Goal: Will achieve and/or maintain hemodynamic stability Outcome: Progressing   Problem: Clinical Measurements: Goal: Postoperative complications will be avoided or minimized Outcome: Progressing   Problem: Respiratory: Goal: Respiratory status will improve Outcome: Progressing   Problem: Skin Integrity: Goal: Wound healing without signs and symptoms of infection Outcome: Progressing Goal: Risk for impaired skin integrity will decrease Outcome: Progressing   Problem: Urinary Elimination: Goal: Ability to achieve and maintain adequate renal perfusion and functioning will improve Outcome: Progressing

## 2022-12-10 NOTE — Progress Notes (Signed)
      [address]       [place] [address]             [phone]                 2 Days Post-Op Procedure(s) (LRB): MITRAL VALVE REPAIR (MVR) USING 34 MM SIMULUS SEMI- RIGID ANNULOPLASTY BAND (N/A) CLIPPING OF ATRIAL APPENDAGE (LAA) USING 50 MM ATRICLIP (Left) MAZE (N/A) TRANSESOPHAGEAL ECHOCARDIOGRAM (N/A)   Events: No events Some confusion _______________________________________________________________ Vitals: BP (!) 149/106   Pulse 90   Temp 98.9 F (37.2 C) (Oral)   Resp (!) 22   Ht 6\' 1"  (1.854 m)   Wt 71.7 kg   SpO2 95%   BMI 20.86 kg/m  Filed Weights   12/08/22 0551 12/09/22 0500 12/10/22 0500  Weight: 70.8 kg 73.8 kg 71.7 kg     - Neuro: alert NAD  - Cardiovascular: sinus  Drips: none.      - Pulm: EWOB  ABG    Component Value Date/Time   PHART 7.348 (L) 12/08/2022 1700   PCO2ART 39.3 12/08/2022 1700   PO2ART 90 12/08/2022 1700   HCO3 21.3 12/08/2022 1700   TCO2 22 12/08/2022 1700   ACIDBASEDEF 4.0 (H) 12/08/2022 1700   O2SAT 96 12/08/2022 1700    - Abd: ND - Extremity: warm  .Intake/Output      10/05 0701 10/06 0700 10/06 0701 10/07 0700   I.V. (mL/kg) 32.9 (0.5)    IV Piggyback 325.2    Total Intake(mL/kg) 358 (5)    Urine (mL/kg/hr) 2015 (1.2)    Chest Tube 490 170   Total Output 2505 170   Net -2147 -170           _______________________________________________________________ Labs:    Latest Ref Rng & Units 12/10/2022    3:33 AM 12/09/2022    4:24 PM 12/09/2022    2:38 AM  CBC  WBC 4.0 - 10.5 K/uL 9.4  12.5  9.2   Hemoglobin 13.0 - 17.0 g/dL 8.9  9.9  9.0   Hematocrit 39.0 - 52.0 % 26.9  30.4  27.5   Platelets 150 - 400 K/uL 78  102  88       Latest Ref Rng & Units 12/10/2022    3:33 AM 12/09/2022    4:44 PM 12/09/2022    4:24 PM  CMP  Glucose 70 - 99 mg/dL 010   272   BUN 8 - 23 mg/dL 7   10   Creatinine 5.36 - 1.24 mg/dL 6.44  0.34  7.42   Sodium 135 - 145 mmol/L 137   135   Potassium 3.5 -  5.1 mmol/L 3.6   3.9   Chloride 98 - 111 mmol/L 104   102   CO2 22 - 32 mmol/L 26   25   Calcium 8.9 - 10.3 mg/dL 8.3   8.3     CXR: -  _______________________________________________________________  Assessment and Plan: POD 2 s/p MVRep, MAZE atriclip  Neuro: pain controlled CV: on A/S/BB.  Titrating BB.  Adding ARB.  On amio.   Pulm: will keep chest tubes given output Renal: creat stable, diuresing GI: on diet Heme: stable ID: afebrile Endo: SSI Dispo: continue ICU care.   Corliss Skains 12/10/2022 10:18 AM

## 2022-12-10 NOTE — Progress Notes (Signed)
   NAME:  James Ibarra, MRN:  [id number], DOB:  1950/12/18, LOS: 2 ADMISSION DATE:  12/08/2022, CONSULTATION DATE:  12/08/22 REFERRING MD:  Leafy Ro, CHIEF COMPLAINT:  SOB   History of Present Illness:  This is a 72 year old man who has a history of hypertension and hyperlipidemia who presented with shortness of breath, heart murmur.  During admission in June 2024 he was noted to have atrial fibrillation and significant mitral valve disease with preserved LV function.  Today he, he underwent mitral valve repair, maze, left atrial clipping.  150 EBL.  Arrived seen and intubated, sedated, drains in place.  Pulmonary critical care is to assist with postoperative management.  Pertinent  Medical History  Hypertension Hyperlipidemia Heart murmur  Significant Hospital Events: Including procedures, antibiotic start and stop dates in addition to other pertinent events   12/08/2022 mitral valve repair, atrial appendage clipping, maze  Interim History / Subjective:  Remains agitated but pain under control.  Objective   Blood pressure (!) 159/90, pulse (!) 102, temperature 97.9 F (36.6 C), temperature source Oral, resp. rate (!) 27, height 6\' 1"  (1.854 m), weight 71.7 kg, SpO2 95%. PAP: (19-28)/(3-5) 28/5      Intake/Output Summary (Last 24 hours) at 12/10/2022 0825 Last data filed at 12/10/2022 0800 Gross per 24 hour  Intake 299.87 ml  Output 2415 ml  Net -2115.13 ml   Filed Weights   12/08/22 0551 12/09/22 0500 12/10/22 0500  Weight: 70.8 kg 73.8 kg 71.7 kg    Examination: No distress Pupils equal and tracking. Continues to have fair amount of output from chest drains Lungs sounds clear otherwise Moves to command Pleasantly confused  BMP ok Plts down a bit H/H down 1 point  CXR R effusion getting a little bigger Stable L hemidiaphragm elevation  Resolved Hospital Problem list   Postoperative ventilator management  Assessment & Plan:  NYHA II symptomatic mitral regurgitation  now status post repair History of atrial fibrillation now status post maze and atrial clipping History of hypertension Mild ICU delirium R effusion postop  - Encourage day/night cycles, family visits, re-orient PRN; restart PTA melatonin at bedtime along with trazadone - AC resumption when cleared by TCTS: Increase metoprolol today - GDMT and line removal per TCTS: still fair bit of output - Consider therapeutic pleural tap on R if continues to grow - Encourage IS, braced coughing, multimodal pain strategy - Will follow with you while in ICU  Myrla Halsted MD PCCM

## 2022-12-11 ENCOUNTER — Encounter (HOSPITAL_COMMUNITY): Payer: Self-pay | Admitting: Thoracic Surgery (Cardiothoracic Vascular Surgery)

## 2022-12-11 DIAGNOSIS — Z9889 Other specified postprocedural states: Secondary | ICD-10-CM | POA: Diagnosis not present

## 2022-12-11 LAB — CBC
HCT: 25.4 % — ABNORMAL LOW (ref 39.0–52.0)
Hemoglobin: 8.4 g/dL — ABNORMAL LOW (ref 13.0–17.0)
MCH: 30.1 pg (ref 26.0–34.0)
MCHC: 33.1 g/dL (ref 30.0–36.0)
MCV: 91 fL (ref 80.0–100.0)
Platelets: 103 10*3/uL — ABNORMAL LOW (ref 150–400)
RBC: 2.79 MIL/uL — ABNORMAL LOW (ref 4.22–5.81)
RDW: 13.7 % (ref 11.5–15.5)
WBC: 9.5 10*3/uL (ref 4.0–10.5)
nRBC: 0 % (ref 0.0–0.2)

## 2022-12-11 LAB — BASIC METABOLIC PANEL
Anion gap: 13 (ref 5–15)
BUN: 7 mg/dL — ABNORMAL LOW (ref 8–23)
CO2: 24 mmol/L (ref 22–32)
Calcium: 8.5 mg/dL — ABNORMAL LOW (ref 8.9–10.3)
Chloride: 98 mmol/L (ref 98–111)
Creatinine, Ser: 0.63 mg/dL (ref 0.61–1.24)
GFR, Estimated: >60 mL/min (ref >60)
Glucose, Bld: 123 mg/dL — ABNORMAL HIGH (ref 70–99)
Potassium: 3.1 mmol/L — ABNORMAL LOW (ref 3.5–5.1)
Sodium: 135 mmol/L (ref 135–145)

## 2022-12-11 LAB — GLUCOSE, CAPILLARY
Glucose-Capillary: 130 mg/dL — ABNORMAL HIGH (ref 70–99)
Glucose-Capillary: 111 mg/dL — ABNORMAL HIGH (ref 70–99)
Glucose-Capillary: 104 mg/dL — ABNORMAL HIGH (ref 70–99)
Glucose-Capillary: 122 mg/dL — ABNORMAL HIGH (ref 70–99)
Glucose-Capillary: 110 mg/dL — ABNORMAL HIGH (ref 70–99)
Glucose-Capillary: 88 mg/dL (ref 70–99)
Glucose-Capillary: 120 mg/dL — ABNORMAL HIGH (ref 70–99)

## 2022-12-11 LAB — MAGNESIUM: Magnesium: 1.7 mg/dL (ref 1.7–2.4)

## 2022-12-11 LAB — SURGICAL PATHOLOGY

## 2022-12-11 MED ORDER — APIXABAN 5 MG PO TABS
5.0000 mg | ORAL_TABLET | Freq: Two times a day (BID) | ORAL | Status: DC
  Administered 2022-12-11 – 2022-12-14 (×7): 5 mg via ORAL
  Filled 2022-12-11 (×7): qty 1

## 2022-12-11 MED ORDER — DEXMEDETOMIDINE HCL IN NACL 400 MCG/100ML IV SOLN
0.0000 ug/kg/h | INTRAVENOUS | Status: DC
  Administered 2022-12-11: 0.4 ug/kg/h via INTRAVENOUS
  Filled 2022-12-11: qty 100

## 2022-12-11 MED ORDER — AMIODARONE LOAD VIA INFUSION
150.0000 mg | Freq: Once | INTRAVENOUS | Status: AC
  Administered 2022-12-11: 150 mg via INTRAVENOUS
  Filled 2022-12-11: qty 83.34

## 2022-12-11 MED ORDER — AMIODARONE HCL IN DEXTROSE 360-4.14 MG/200ML-% IV SOLN
30.0000 mg/h | INTRAVENOUS | Status: DC
  Administered 2022-12-11 – 2022-12-12 (×3): 30 mg/h via INTRAVENOUS
  Filled 2022-12-11 (×2): qty 200

## 2022-12-11 MED ORDER — POTASSIUM CHLORIDE CRYS ER 20 MEQ PO TBCR
20.0000 meq | EXTENDED_RELEASE_TABLET | ORAL | Status: AC
  Administered 2022-12-11 (×3): 20 meq via ORAL
  Filled 2022-12-11 (×3): qty 1

## 2022-12-11 MED ORDER — ASPIRIN 81 MG PO TBEC
81.0000 mg | DELAYED_RELEASE_TABLET | Freq: Every day | ORAL | Status: DC
  Administered 2022-12-11 – 2022-12-13 (×3): 81 mg via ORAL
  Filled 2022-12-11 (×3): qty 1

## 2022-12-11 MED ORDER — AMIODARONE HCL IN DEXTROSE 360-4.14 MG/200ML-% IV SOLN
60.0000 mg/h | INTRAVENOUS | Status: AC
  Administered 2022-12-11 (×2): 60 mg/h via INTRAVENOUS
  Filled 2022-12-11 (×2): qty 200

## 2022-12-11 MED ORDER — HALOPERIDOL LACTATE 5 MG/ML IJ SOLN
5.0000 mg | Freq: Once | INTRAMUSCULAR | Status: AC
  Administered 2022-12-11: 5 mg via INTRAVENOUS
  Filled 2022-12-11: qty 1

## 2022-12-11 MED ORDER — QUETIAPINE FUMARATE 25 MG PO TABS: 25.0000 mg | ORAL_TABLET | Freq: Two times a day (BID) | ORAL | Status: DC

## 2022-12-11 MED ORDER — ASPIRIN 81 MG PO CHEW: 81.0000 mg | CHEWABLE_TABLET | Freq: Every day | ORAL | Status: DC

## 2022-12-11 NOTE — Progress Notes (Signed)
   NAME:  James Ibarra, MRN:  [id number], DOB:  [birth year], LOS: 3 ADMISSION DATE:  12/08/2022, CONSULTATION DATE:  12/08/22 REFERRING MD:  Leafy Ro, CHIEF COMPLAINT:  SOB   History of Present Illness:  This is a 72 year old man who has a history of hypertension and hyperlipidemia who presented with shortness of breath, heart murmur.  During admission in June 2024 he was noted to have atrial fibrillation and significant mitral valve disease with preserved LV function.  Today he, he underwent mitral valve repair, maze, left atrial clipping.  150 EBL.  Arrived seen and intubated, sedated, drains in place.  Pulmonary critical care is to assist with postoperative management.  Pertinent  Medical History  Hypertension Hyperlipidemia Heart murmur  Significant Hospital Events: Including procedures, antibiotic start and stop dates in addition to other pertinent events   12/08/2022 mitral valve repair, atrial appendage clipping, maze 12/10/2022 agitation requiring Precedex.  Interim History / Subjective:  Agitation has improved patient cooperative although lacking insight into why he is in hospital.  Objective   Blood pressure (!) 153/84, pulse 83, temperature 98.5 F (36.9 C), temperature source Oral, resp. rate (!) 21, height 6\' 1"  (1.854 m), weight 75.2 kg, SpO2 95%.        Intake/Output Summary (Last 24 hours) at 12/11/2022 1228 Last data filed at 12/11/2022 1100 Gross per 24 hour  Intake 277.41 ml  Output 3665 ml  Net -3387.59 ml   Filed Weights   12/09/22 0500 12/10/22 0500 12/11/22 0500  Weight: 73.8 kg 71.7 kg 75.2 kg   Examination: General: Sitting in bed no distress HEENT: No icterus. Chest: Clear to auscultation bilaterally CV: Chest incision well opposed.  Heart sounds unremarkable. Neuro: Awake alert following commands no tremor no agitation.  Ancillary test personally reviewed:  Hemoglobin 8.4.  Platelet count 103 increasing.  Assessment & Plan:  NYHA II symptomatic  mitral regurgitation now status post repair History of atrial fibrillation now status post maze and atrial clipping History of hypertension ICU delirium R effusion postop  -Encourage day/night cycles, family visits, re-orient PRN; restart PTA melatonin at bedtime along with trazadone -Wean dexmedetomidine to off.  Cannot use antipsychotics at this time due to QT prolongation -Taper IV amiodarone and transition to oral. -Progressive ambulation.  Remove lines and monitoring devices. -Resume apixaban for atrial fibrillation. -History of alcohol abuse but currently no strong signs of alcohol withdrawal.  Will hold on starting benzodiazepines at this time. -Ultimately transfer out of ICU may be what helps resolve delirium.  Lynnell Catalan, MD [facility] ICU Physician [facility]  Pager: [phone] Or Epic Secure Chat After hours: [phone].  12/11/2022, 12:34 PM

## 2022-12-11 NOTE — Progress Notes (Signed)
      [address]       [place] [address]             [phone]                 3 Days Post-Op Procedure(s) (LRB): MITRAL VALVE REPAIR (MVR) USING 34 MM SIMULUS SEMI- RIGID ANNULOPLASTY BAND (N/A) CLIPPING OF ATRIAL APPENDAGE (LAA) USING 50 MM ATRICLIP (Left) MAZE (N/A) TRANSESOPHAGEAL ECHOCARDIOGRAM (N/A)   Events: Agitated overnight _______________________________________________________________ Vitals: BP 130/66   Pulse 78   Temp 98 F (36.7 C) (Oral)   Resp (!) 24   Ht 6\' 1"  (1.854 m)   Wt 75.2 kg   SpO2 96%   BMI 21.87 kg/m  Filed Weights   12/09/22 0500 12/10/22 0500 12/11/22 0500  Weight: 73.8 kg 71.7 kg 75.2 kg     - Neuro: alert NAD  - Cardiovascular: sinus  Drips: none.      - Pulm: EWOB  ABG    Component Value Date/Time   PHART 7.348 (L) 12/08/2022 1700   PCO2ART 39.3 12/08/2022 1700   PO2ART 90 12/08/2022 1700   HCO3 21.3 12/08/2022 1700   TCO2 22 12/08/2022 1700   ACIDBASEDEF 4.0 (H) 12/08/2022 1700   O2SAT 96 12/08/2022 1700    - Abd: ND - Extremity: warm  .Intake/Output      10/06 0701 10/07 0700 10/07 0701 10/08 0700   I.V. (mL/kg) 113.5 (1.5)    IV Piggyback     Total Intake(mL/kg) 113.5 (1.5)    Urine (mL/kg/hr) 3310 (1.8)    Chest Tube 300    Total Output 3610    Net -3496.5         Urine Occurrence 1 x       _______________________________________________________________ Labs:    Latest Ref Rng & Units 12/11/2022    4:49 AM 12/10/2022    3:33 AM 12/09/2022    4:24 PM  CBC  WBC 4.0 - 10.5 K/uL 9.5  9.4  12.5   Hemoglobin 13.0 - 17.0 g/dL 8.4  8.9  9.9   Hematocrit 39.0 - 52.0 % 25.4  26.9  30.4   Platelets 150 - 400 K/uL 103  78  102       Latest Ref Rng & Units 12/11/2022    4:49 AM 12/10/2022    3:33 AM 12/09/2022    4:44 PM  CMP  Glucose 70 - 99 mg/dL 914  782    BUN 8 - 23 mg/dL 7  7    Creatinine 9.56 - 1.24 mg/dL 2.13  0.86  5.78   Sodium 135 - 145 mmol/L 135  137    Potassium 3.5  - 5.1 mmol/L 3.1  3.6    Chloride 98 - 111 mmol/L 98  104    CO2 22 - 32 mmol/L 24  26    Calcium 8.9 - 10.3 mg/dL 8.5  8.3      CXR: -  _______________________________________________________________  Assessment and Plan: POD 3 s/p MVRep, MAZE atriclip  Neuro: agitated.  Concern for ETOH withdrawal.  CV: on A/S/BB.  Titrating BB.  Adding ARB.  On amio gtt.  Will remove pacing wires, start eliquis.   Pulm: will remove chest  Renal: creat stable, diuresing GI: on diet Heme: stable ID: afebrile Endo: SSI Dispo: continue ICU care.   Corliss Skains 12/11/2022 7:55 AM

## 2022-12-11 NOTE — Progress Notes (Addendum)
eLink Physician-Brief Progress Note Patient Name: James Ibarra DOB: 28-Mar-1950 MRN: [id number]   Date of Service  12/11/2022  HPI/Events of Note  Pt agitated, confused.  Trying to get up from bed, pulling at lines.  Attempts to reorient him have been unsuccessful.  Given melatonin, trazodone without effect.   eICU Interventions  Haldol 5mg  ordered for delirium.  Qtc 455 Will follow response.         Kilie Rund M DELA CRUZ 12/11/2022, 2:39 AM  3:52 AM Posey belt ordered.   4:31 AM Pt persistently confused and agitated despite earlier trial of haldol.  Will start on precedex drip.  Monitor for development of bradycardia/hypotension

## 2022-12-11 NOTE — Progress Notes (Signed)
Patient ID: James Ibarra, male   DOB: [birth year], 72 y.o.   MRN: [id number] TCTS Evening Rounds:  Hemodynamically stable in sinus rhythm on IV amio.  -1500 cc today.  He was delirious last night but had been calmer today.

## 2022-12-12 LAB — BASIC METABOLIC PANEL
Anion gap: 8 (ref 5–15)
BUN: 9 mg/dL (ref 8–23)
CO2: 26 mmol/L (ref 22–32)
Calcium: 8.4 mg/dL — ABNORMAL LOW (ref 8.9–10.3)
Chloride: 106 mmol/L (ref 98–111)
Creatinine, Ser: 0.55 mg/dL — ABNORMAL LOW (ref 0.61–1.24)
GFR, Estimated: >60 mL/min (ref >60)
Glucose, Bld: 106 mg/dL — ABNORMAL HIGH (ref 70–99)
Potassium: 3.1 mmol/L — ABNORMAL LOW (ref 3.5–5.1)
Sodium: 140 mmol/L (ref 135–145)

## 2022-12-12 LAB — CBC
HCT: 24.3 % — ABNORMAL LOW (ref 39.0–52.0)
Hemoglobin: 7.8 g/dL — ABNORMAL LOW (ref 13.0–17.0)
MCH: 29.1 pg (ref 26.0–34.0)
MCHC: 32.1 g/dL (ref 30.0–36.0)
MCV: 90.7 fL (ref 80.0–100.0)
Platelets: 165 10*3/uL (ref 150–400)
RBC: 2.68 MIL/uL — ABNORMAL LOW (ref 4.22–5.81)
RDW: 14.2 % (ref 11.5–15.5)
WBC: 6.7 10*3/uL (ref 4.0–10.5)
nRBC: 0 % (ref 0.0–0.2)

## 2022-12-12 LAB — GLUCOSE, CAPILLARY
Glucose-Capillary: 91 mg/dL (ref 70–99)
Glucose-Capillary: 176 mg/dL — ABNORMAL HIGH (ref 70–99)
Glucose-Capillary: 113 mg/dL — ABNORMAL HIGH (ref 70–99)

## 2022-12-12 MED ORDER — POTASSIUM CHLORIDE CRYS ER 20 MEQ PO TBCR
20.0000 meq | EXTENDED_RELEASE_TABLET | ORAL | Status: AC
  Administered 2022-12-12 (×3): 20 meq via ORAL
  Filled 2022-12-12 (×3): qty 1

## 2022-12-12 MED ORDER — TRAMADOL HCL 50 MG PO TABS: 50.0000 mg | ORAL_TABLET | ORAL | Status: DC | PRN

## 2022-12-12 MED ORDER — SODIUM CHLORIDE 0.9% FLUSH
3.0000 mL | Freq: Two times a day (BID) | INTRAVENOUS | Status: DC
  Administered 2022-12-12 – 2022-12-13 (×2): 3 mL via INTRAVENOUS

## 2022-12-12 MED ORDER — AMIODARONE HCL 200 MG PO TABS
400.0000 mg | ORAL_TABLET | Freq: Two times a day (BID) | ORAL | Status: DC
  Administered 2022-12-12 (×2): 400 mg via ORAL
  Filled 2022-12-12 (×2): qty 2

## 2022-12-12 NOTE — Evaluation (Signed)
Physical Therapy Evaluation Patient Details Name: James Ibarra MRN: [id number] DOB: June 13, 1950 Today's Date: 12/12/2022  History of Present Illness  Pt admitted on 10/4 for MVR, L atrial clipping, and maze procedure. PMH: HTN, HLD, afib, heart murmur  Clinical Impression  Pt admitted with above. Pt pleasant, oriented, and cooperative. Pt re-educated on sternal precautions as he is able to verbalize them but doesn't adhere functionally. Pt able to tolerate amb with RW without LOB or seated rest break with contact guard. Anticipate pt to continue to progress well and not need PT services upon d/c. Acute PT to cont to follow while in hospital.        If plan is discharge home, recommend the following: A little help with walking and/or transfers;A little help with bathing/dressing/bathroom;Help with stairs or ramp for entrance;Assist for transportation   Can travel by private vehicle        Equipment Recommendations Rolling walker (2 wheels)  Recommendations for Other Services       Functional Status Assessment Patient has had a recent decline in their functional status and demonstrates the ability to make significant improvements in function in a reasonable and predictable amount of time.     Precautions / Restrictions Precautions Precautions: Sternal Precaution Booklet Issued: No Precaution Comments: pt able to state precautions vebally but requires verbal cues to adhere functionally Restrictions Weight Bearing Restrictions: Yes Other Position/Activity Restrictions: sternal precautions, limiting use of UEs      Mobility  Bed Mobility               General bed mobility comments: pt received sitting up in chair    Transfers Overall transfer level: Needs assistance Equipment used: None Transfers: Sit to/from Stand Sit to Stand: Min assist           General transfer comment: minA to power up, pt hugged red pillow, verbal cues to not push up or reach back with  UEs    Ambulation/Gait Ambulation/Gait assistance: Contact guard assist Gait Distance (Feet): 370 Feet Assistive device: Rolling walker (2 wheels) Gait Pattern/deviations: Step-through pattern, Decreased stride length, Trunk flexed Gait velocity: dec Gait velocity interpretation: <1.31 ft/sec, indicative of household ambulator   General Gait Details: verbal cues to look forward and stand up tall, 2 standing rest breaks, no LOB  Stairs            Wheelchair Mobility     Tilt Bed    Modified Rankin (Stroke Patients Only)       Balance Overall balance assessment: Mild deficits observed, not formally tested                                           Pertinent Vitals/Pain Pain Assessment Pain Assessment: No/denies pain    Home Living Family/patient expects to be discharged to:: Private residence Living Arrangements: Spouse/significant other Available Help at Discharge: Family;Available 24 hours/day Type of Home: House Home Access: Stairs to enter Entrance Stairs-Rails: Right Entrance Stairs-Number of Steps: 3 Alternate Level Stairs-Number of Steps: flight Home Layout: Multi-level;Able to live on main level with bedroom/bathroom (doesn't go to basement much, play room for grandkids) Home Equipment: Rolling Walker (2 wheels);Shower seat      Prior Function Prior Level of Function : Independent/Modified Independent             Mobility Comments: amb without AD ADLs Comments: indep  Extremity/Trunk Assessment   Upper Extremity Assessment Upper Extremity Assessment: Overall WFL for tasks assessed    Lower Extremity Assessment Lower Extremity Assessment: Overall WFL for tasks assessed    Cervical / Trunk Assessment Cervical / Trunk Assessment: Other exceptions Cervical / Trunk Exceptions: sternal incision  Communication   Communication Communication: No apparent difficulties  Cognition Arousal: Alert Behavior During Therapy: WFL  for tasks assessed/performed Overall Cognitive Status: Within Functional Limits for tasks assessed                                 General Comments: per chart pt with onset of delirium and agitation as the day progressess however pt pleasant, oriented, and cooperative during eval        General Comments General comments (skin integrity, edema, etc.): VSS    Exercises     Assessment/Plan    PT Assessment Patient needs continued PT services  PT Problem List Decreased strength;Decreased activity tolerance;Decreased balance;Decreased mobility;Cardiopulmonary status limiting activity       PT Treatment Interventions DME instruction;Gait training;Stair training;Functional mobility training;Therapeutic activities;Therapeutic exercise;Balance training    PT Goals (Current goals can be found in the Care Plan section)  Acute Rehab PT Goals Patient Stated Goal: home PT Goal Formulation: With patient Time For Goal Achievement: 12/26/22 Potential to Achieve Goals: Good    Frequency Min 1X/week     Co-evaluation               AM-PAC PT "6 Clicks" Mobility  Outcome Measure Help needed turning from your back to your side while in a flat bed without using bedrails?: A Little Help needed moving from lying on your back to sitting on the side of a flat bed without using bedrails?: A Little Help needed moving to and from a bed to a chair (including a wheelchair)?: A Little Help needed standing up from a chair using your arms (e.g., wheelchair or bedside chair)?: A Little Help needed to walk in hospital room?: A Little Help needed climbing 3-5 steps with a railing? : A Little 6 Click Score: 18    End of Session Equipment Utilized During Treatment: Gait belt Activity Tolerance: Patient tolerated treatment well Patient left: in chair;with call bell/phone within reach Nurse Communication: Mobility status PT Visit Diagnosis: Unsteadiness on feet (R26.81);Muscle weakness  (generalized) (M62.81)    Time: 5621-3086 PT Time Calculation (min) (ACUTE ONLY): 18 min   Charges:   PT Evaluation $PT Eval Moderate Complexity: 1 Mod   PT General Charges $$ ACUTE PT VISIT: 1 Visit         James Ibarra, PT, DPT Acute Rehabilitation Services Secure chat preferred Office #: [phone]   James Ibarra 12/12/2022, 12:18 PM

## 2022-12-12 NOTE — Progress Notes (Signed)
4 Days Post-Op Procedure(s) (LRB): MITRAL VALVE REPAIR (MVR) USING 34 MM SIMULUS SEMI- RIGID ANNULOPLASTY BAND (N/A) CLIPPING OF ATRIAL APPENDAGE (LAA) USING 50 MM ATRICLIP (Left) MAZE (N/A) TRANSESOPHAGEAL ECHOCARDIOGRAM (N/A) Subjective: Feels better. Slept well. No significant delirium overnight. Eating breakfast this am.  Objective: Vital signs in last 24 hours: Temp:  [97.7 F (36.5 C)-99.3 F (37.4 C)] 98.4 F (36.9 C) (10/08 0430) Pulse Rate:  [63-89] 74 (10/08 0600) Cardiac Rhythm: Normal sinus rhythm (10/07 2100) Resp:  [15-31] 19 (10/08 0600) BP: (98-161)/(61-98) 121/64 (10/08 0600) SpO2:  [86 %-98 %] 93 % (10/08 0600)  Hemodynamic parameters for last 24 hours:    Intake/Output from previous day: 10/07 0701 - 10/08 0700 In: 475.5 [I.V.:475.5] Out: 2200 [Urine:2200] Intake/Output this shift: No intake/output data recorded.  General appearance: alert and cooperative Neurologic: intact Heart: regular rate and rhythm, S1, S2 normal, no murmur Lungs: clear to auscultation bilaterally Extremities: no edema Wound: incision healing well.  Lab Results: Recent Labs    12/11/22 0449 12/12/22 0533  WBC 9.5 6.7  HGB 8.4* 7.8*  HCT 25.4* 24.3*  PLT 103* 165   BMET:  Recent Labs    12/11/22 0449 12/12/22 0533  NA 135 140  K 3.1* 3.1*  CL 98 106  CO2 24 26  GLUCOSE 123* 106*  BUN 7* 9  CREATININE 0.63 0.55*  CALCIUM 8.5* 8.4*    PT/INR: No results for input(s): "LABPROT", "INR" in the last 72 hours. ABG    Component Value Date/Time   PHART 7.348 (L) 12/08/2022 1700   HCO3 21.3 12/08/2022 1700   TCO2 22 12/08/2022 1700   ACIDBASEDEF 4.0 (H) 12/08/2022 1700   O2SAT 96 12/08/2022 1700   CBG (last 3)  Recent Labs    12/11/22 1949 12/11/22 2312 12/12/22 0501  GLUCAP 88 120* 91    Assessment/Plan: S/P Procedure(s) (LRB): MITRAL VALVE REPAIR (MVR) USING 34 MM SIMULUS SEMI- RIGID ANNULOPLASTY BAND (N/A) CLIPPING OF ATRIAL APPENDAGE (LAA) USING 50  MM ATRICLIP (Left) MAZE (N/A) TRANSESOPHAGEAL ECHOCARDIOGRAM (N/A)  POD 4 Hemodynamically stable in sinus rhythm on IV amio. Switch to po. Continue Lopressor. On Eliquis.  -1700 cc yesterday. No wt yet today. He has no edema so will hold on further diuresis for now. Replete K+.  DC sleeve  IS, ambulation, PT.  Transfer to 4E.   LOS: 4 days    Alleen Borne 12/12/2022

## 2022-12-12 NOTE — Progress Notes (Signed)
      [address]       [place] [address]             [phone]    POD # 4 MV repair, Maze, LAA clip  Up in chair  BP 120/72   Pulse 75   Temp 98.4 F (36.9 C) (Oral)   Resp 20   Ht 6\' 1"  (1.854 m)   Wt 75.2 kg   SpO2 97%   BMI 21.87 kg/m    Intake/Output Summary (Last 24 hours) at 12/12/2022 1737 Last data filed at 12/12/2022 1200 Gross per 24 hour  Intake 746.75 ml  Output 550 ml  Net 196.75 ml   Awaiting bed on 4E  Norma Montemurro C. Dorris Fetch, MD [facility] [phone]

## 2022-12-13 LAB — BASIC METABOLIC PANEL
Anion gap: 5 (ref 5–15)
Anion gap: 8 (ref 5–15)
BUN: 10 mg/dL (ref 8–23)
BUN: 14 mg/dL (ref 8–23)
CO2: 25 mmol/L (ref 22–32)
CO2: 23 mmol/L (ref 22–32)
Calcium: 8.5 mg/dL — ABNORMAL LOW (ref 8.9–10.3)
Calcium: 9 mg/dL (ref 8.9–10.3)
Chloride: 109 mmol/L (ref 98–111)
Chloride: 109 mmol/L (ref 98–111)
Creatinine, Ser: 0.67 mg/dL (ref 0.61–1.24)
Creatinine, Ser: 0.69 mg/dL (ref 0.61–1.24)
GFR, Estimated: >60 mL/min (ref >60)
GFR, Estimated: >60 mL/min (ref >60)
Glucose, Bld: 100 mg/dL — ABNORMAL HIGH (ref 70–99)
Glucose, Bld: 109 mg/dL — ABNORMAL HIGH (ref 70–99)
Potassium: 3.4 mmol/L — ABNORMAL LOW (ref 3.5–5.1)
Potassium: 4 mmol/L (ref 3.5–5.1)
Sodium: 139 mmol/L (ref 135–145)
Sodium: 140 mmol/L (ref 135–145)

## 2022-12-13 LAB — CBC
HCT: 26.4 % — ABNORMAL LOW (ref 39.0–52.0)
Hemoglobin: 8.5 g/dL — ABNORMAL LOW (ref 13.0–17.0)
MCH: 29.1 pg (ref 26.0–34.0)
MCHC: 32.2 g/dL (ref 30.0–36.0)
MCV: 90.4 fL (ref 80.0–100.0)
Platelets: 240 10*3/uL (ref 150–400)
RBC: 2.92 MIL/uL — ABNORMAL LOW (ref 4.22–5.81)
RDW: 14.2 % (ref 11.5–15.5)
WBC: 7 10*3/uL (ref 4.0–10.5)
nRBC: 0 % (ref 0.0–0.2)

## 2022-12-13 LAB — GLUCOSE, CAPILLARY
Glucose-Capillary: 112 mg/dL — ABNORMAL HIGH (ref 70–99)
Glucose-Capillary: 107 mg/dL — ABNORMAL HIGH (ref 70–99)
Glucose-Capillary: 162 mg/dL — ABNORMAL HIGH (ref 70–99)
Glucose-Capillary: 94 mg/dL (ref 70–99)
Glucose-Capillary: 104 mg/dL — ABNORMAL HIGH (ref 70–99)
Glucose-Capillary: 95 mg/dL (ref 70–99)

## 2022-12-13 MED ORDER — AMIODARONE HCL 200 MG PO TABS
200.0000 mg | ORAL_TABLET | Freq: Every day | ORAL | Status: DC
  Administered 2022-12-13 – 2022-12-14 (×2): 200 mg via ORAL
  Filled 2022-12-13 (×2): qty 1

## 2022-12-13 MED ORDER — AMIODARONE HCL 200 MG PO TABS: 200.0000 mg | ORAL_TABLET | Freq: Two times a day (BID) | ORAL | Status: DC

## 2022-12-13 MED ORDER — ~~LOC~~ CARDIAC SURGERY, PATIENT & FAMILY EDUCATION
Freq: Once | Status: AC
  Administered 2022-12-13: 1

## 2022-12-13 MED ORDER — ASPIRIN 81 MG PO TBEC
81.0000 mg | DELAYED_RELEASE_TABLET | Freq: Every day | ORAL | Status: DC
  Administered 2022-12-14: 81 mg via ORAL
  Filled 2022-12-13: qty 1

## 2022-12-13 MED ORDER — SODIUM CHLORIDE 0.9% FLUSH
3.0000 mL | Freq: Two times a day (BID) | INTRAVENOUS | Status: DC
  Administered 2022-12-13 – 2022-12-14 (×3): 3 mL via INTRAVENOUS

## 2022-12-13 MED ORDER — ORAL CARE MOUTH RINSE: 15.0000 mL | OROMUCOSAL | Status: DC | PRN

## 2022-12-13 MED ORDER — SODIUM CHLORIDE 0.9 % IV SOLN: 250.0000 mL | INTRAVENOUS | Status: DC | PRN

## 2022-12-13 MED ORDER — SODIUM CHLORIDE 0.9% FLUSH: 3.0000 mL | INTRAVENOUS | Status: DC | PRN

## 2022-12-13 MED ORDER — POTASSIUM CHLORIDE CRYS ER 20 MEQ PO TBCR
20.0000 meq | EXTENDED_RELEASE_TABLET | ORAL | Status: AC
  Administered 2022-12-13 (×3): 20 meq via ORAL
  Filled 2022-12-13 (×3): qty 1

## 2022-12-13 MED ORDER — FERROUS GLUCONATE 324 (38 FE) MG PO TABS
324.0000 mg | ORAL_TABLET | Freq: Every day | ORAL | Status: DC
  Filled 2022-12-13: qty 1

## 2022-12-13 MED ORDER — METOPROLOL TARTRATE 100 MG PO TABS
100.0000 mg | ORAL_TABLET | Freq: Two times a day (BID) | ORAL | Status: DC
  Administered 2022-12-13 – 2022-12-14 (×3): 100 mg via ORAL
  Filled 2022-12-13: qty 2
  Filled 2022-12-13 (×2): qty 1

## 2022-12-13 MED ORDER — LACTULOSE 10 GM/15ML PO SOLN: 20.0000 g | Freq: Every day | ORAL | Status: DC | PRN

## 2022-12-13 NOTE — TOC Progression Note (Deleted)
Transition of Care [facility]) - Progression Note    Patient Details  Name: James Ibarra MRN: [id number] Date of Birth: Jun 11, 1950  Transition of Care [facility]) CM/SW Contact  Mariea Stable Davene Costain, RN Phone Number:  12/13/2022, 4:02 PM  Clinical Narrative:       Expected Discharge Plan: Home/Self Care Barriers to Discharge: Continued Medical Work up  Expected Discharge Plan and Services   Discharge Planning Services: CM Consult Post Acute Care Choice: Home Health Living arrangements for the past 2 months: Single Family Home                      Social Determinants of Health (SDOH) Interventions SDOH Screenings   Food Insecurity: No Food Insecurity (08/12/2022)  Housing: Low Risk  (08/12/2022)  Transportation Needs: No Transportation Needs (08/12/2022)  Utilities: Not At Risk (08/12/2022)  Tobacco Use: Low Risk  (12/08/2022)    Readmission Risk Interventions     No data to display

## 2022-12-13 NOTE — Progress Notes (Signed)
CARDIAC REHAB PHASE I   PRE:  Rate/Rhythm: 69 NSR  BP:  Sitting: 147/81      SaO2: 97 RA  MODE:  Ambulation: 740 ft   AD:   RW  POST:  Rate/Rhythm: 75 NSR  BP:  Sitting: 158/83      SaO2: 98 RA  Pt ambulated with standby assistance, pt c/p of minimal SOB before being brought back to room.   Pt received OHS book and education on restrictions, heart healthy diet, ex guidelines, Move in the Tube sheet, incentive spirometer use when d/c and CRPII. Pt denies questions and was encouraged to look in the book for additional information. Referral placed to [facility].    Faustino Congress  ACSM-CEP 2:02 PM 12/13/2022    Service time is from 1315 to 1350.

## 2022-12-13 NOTE — Progress Notes (Signed)
      [address]       [place] [address]             [phone]      5 Days Post-Op  Procedure(s) (LRB): MITRAL VALVE REPAIR (MVR) USING 34 MM SIMULUS SEMI- RIGID ANNULOPLASTY BAND (N/A) CLIPPING OF ATRIAL APPENDAGE (LAA) USING 50 MM ATRICLIP (Left) MAZE (N/A) TRANSESOPHAGEAL ECHOCARDIOGRAM (N/A)   Total Length of Stay:  LOS: 5 days    SUBJECTIVE: Feels well Ambulating with some SOB   Vitals:   12/13/22 0600 12/13/22 0700  BP: (!) 152/92   Pulse: 74 77  Resp: (!) 23 (!) 29  Temp:    SpO2: 96% 97%    Intake/Output      10/08 0701 10/09 0700 10/09 0701 10/10 0700   P.O. 720    I.V. (mL/kg) 38.5 (0.6)    Total Intake(mL/kg) 758.5 (11.3)    Urine (mL/kg/hr) 950 (0.6)    Stool 0    Total Output 950    Net -191.5         Stool Occurrence 1 x         CBC    Component Value Date/Time   WBC 6.7 12/12/2022 0533   RBC 2.68 (L) 12/12/2022 0533   HGB 7.8 (L) 12/12/2022 0533   HGB 13.4 10/04/2022 1042   HCT 24.3 (L) 12/12/2022 0533   HCT 43.4 10/04/2022 1042   PLT 165 12/12/2022 0533   PLT 166 10/04/2022 1042   MCV 90.7 12/12/2022 0533   MCV 92 10/04/2022 1042   MCH 29.1 12/12/2022 0533   MCHC 32.1 12/12/2022 0533   RDW 14.2 12/12/2022 0533   RDW 12.8 10/04/2022 1042   LYMPHSABS 0.8 08/16/2022 0412   MONOABS 1.5 (H) 08/16/2022 0412   EOSABS 0.1 08/16/2022 0412   BASOSABS 0.0 08/16/2022 0412   CMP     Component Value Date/Time   NA 139 12/13/2022 0252   NA 144 10/04/2022 1042   K 3.4 (L) 12/13/2022 0252   CL 109 12/13/2022 0252   CO2 25 12/13/2022 0252   GLUCOSE 100 (H) 12/13/2022 0252   BUN 10 12/13/2022 0252   BUN 12 10/04/2022 1042   CREATININE 0.67 12/13/2022 0252   CALCIUM 8.5 (L) 12/13/2022 0252   PROT 6.1 (L) 12/06/2022 1156   PROT 6.1 10/04/2022 1042   ALBUMIN 3.6 12/06/2022 1156   ALBUMIN 4.0 10/04/2022 1042   AST 21 12/06/2022 1156   ALT 20 12/06/2022 1156   ALKPHOS 49 12/06/2022 1156   BILITOT 0.6  12/06/2022 1156   BILITOT 0.7 10/04/2022 1042   GFRNONAA >60 12/13/2022 0252   GFRAA >90 11/03/2011 0900   ABG    Component Value Date/Time   PHART 7.348 (L) 12/08/2022 1700   PCO2ART 39.3 12/08/2022 1700   PO2ART 90 12/08/2022 1700   HCO3 21.3 12/08/2022 1700   TCO2 22 12/08/2022 1700   ACIDBASEDEF 4.0 (H) 12/08/2022 1700   O2SAT 96 12/08/2022 1700   CBG (last 3)  Recent Labs    12/12/22 2046 12/13/22 0018 12/13/22 0339  GLUCAP 113* 112* 107*  EXAM Lungs: clear Card: RR with no murmur Ext: warm, no edema Neuro: alert   ASSESSMENT: POD #5 sp MV repair MAZE Doing well. Will increase BB to 100mg  bid and lower amio to 200mg  a day Hopefully home tomorrow   Eugenio Hoes, MD 12/13/2022

## 2022-12-13 NOTE — TOC Progression Note (Addendum)
Transition of Care [facility]) - Progression Note    Patient Details  Name: James Ibarra MRN: [id number] Date of Birth: 1950-03-26  Transition [facility]) CM/SW Contact  Elliot Cousin, RN Phone Number: [phone] 12/13/2022, 4:04 PM   Clinical Narrative:  CM spoke to pt and offered choice for [facility], (medicare.gov list given and placed on chart). Pt agreeable to [facility]. Pt states wife will provide transportation home. Has RW at home.  Contacted [facility] with new referral. [facility] accepted referral for [facility].   PCP appt arranged for 12/20/2022 at 430 pm    Expected Discharge Plan: Home/Self Care Barriers to Discharge: Continued Medical Work up  Expected Discharge Plan and Services   Discharge Planning Services: CM Consult Post Acute Care Choice: Home Health Living arrangements for the past 2 months: Single Family Home                           HH Arranged: RN [facility] Agency: [facility] Date HH Agency Contacted: 12/13/22 Time HH Agency Contacted: 1603 Representative spoke with at [facility] Agency: Morrie Sheldon   Social Determinants of Health (SDOH) Interventions SDOH Screenings   Food Insecurity: No Food Insecurity (08/12/2022)  Housing: Low Risk  (08/12/2022)  Transportation Needs: No Transportation Needs (08/12/2022)  Utilities: Not At Risk (08/12/2022)  Tobacco Use: Low Risk  (12/08/2022)    Readmission Risk Interventions     No data to display

## 2022-12-14 LAB — BASIC METABOLIC PANEL
Anion gap: 9 (ref 5–15)
BUN: 9 mg/dL (ref 8–23)
CO2: 25 mmol/L (ref 22–32)
Calcium: 8.7 mg/dL — ABNORMAL LOW (ref 8.9–10.3)
Chloride: 106 mmol/L (ref 98–111)
Creatinine, Ser: 0.72 mg/dL (ref 0.61–1.24)
GFR, Estimated: >60 mL/min (ref >60)
Glucose, Bld: 94 mg/dL (ref 70–99)
Potassium: 3.7 mmol/L (ref 3.5–5.1)
Sodium: 140 mmol/L (ref 135–145)

## 2022-12-14 MED ORDER — TRAMADOL HCL 50 MG PO TABS: 50.0000 mg | ORAL_TABLET | Freq: Four times a day (QID) | ORAL | 0 refills | Status: AC | PRN

## 2022-12-14 MED ORDER — ACETAMINOPHEN 325 MG PO TABS: 650.0000 mg | ORAL_TABLET | Freq: Four times a day (QID) | ORAL | Status: AC

## 2022-12-14 MED ORDER — ASPIRIN 81 MG PO TBEC: 81.0000 mg | DELAYED_RELEASE_TABLET | Freq: Every day | ORAL | 12 refills | Status: DC

## 2022-12-14 MED ORDER — METOPROLOL TARTRATE 100 MG PO TABS: 100.0000 mg | ORAL_TABLET | Freq: Two times a day (BID) | ORAL | 5 refills | Status: DC

## 2022-12-14 MED ORDER — AMIODARONE HCL 200 MG PO TABS: 200.0000 mg | ORAL_TABLET | Freq: Every day | ORAL | 2 refills | Status: DC

## 2022-12-14 MED ORDER — FERROUS GLUCONATE 324 (38 FE) MG PO TABS: 324.0000 mg | ORAL_TABLET | Freq: Every day | ORAL | 0 refills | Status: DC

## 2022-12-14 MED FILL — Lidocaine HCl Local Preservative Free (PF) Inj 2%: INTRAMUSCULAR | Qty: 14 | Status: AC

## 2022-12-14 MED FILL — Sodium Chloride IV Soln 0.9%: INTRAVENOUS | Qty: 3000 | Status: AC

## 2022-12-14 MED FILL — Potassium Chloride Inj 2 mEq/ML: INTRAVENOUS | Qty: 40 | Status: AC

## 2022-12-14 MED FILL — Electrolyte-R (PH 7.4) Solution: INTRAVENOUS | Qty: 5000 | Status: AC

## 2022-12-14 MED FILL — Heparin Sodium (Porcine) Inj 1000 Unit/ML: INTRAMUSCULAR | Qty: 30 | Status: AC

## 2022-12-14 MED FILL — Sodium Bicarbonate IV Soln 8.4%: INTRAVENOUS | Qty: 50 | Status: AC

## 2022-12-14 MED FILL — Mannitol IV Soln 20%: INTRAVENOUS | Qty: 500 | Status: AC

## 2022-12-14 MED FILL — Heparin Sodium (Porcine) Inj 1000 Unit/ML: INTRAMUSCULAR | Qty: 10 | Status: AC

## 2022-12-14 MED FILL — Heparin Sodium (Porcine) Inj 1000 Unit/ML: Qty: 1000 | Status: AC

## 2022-12-14 NOTE — TOC Transition Note (Signed)
Transition of Care (TOC) - CM/SW Discharge Note Donn Pierini RN, BSN Transitions of Care Unit 4E- RN Case Manager See Treatment Team for direct phone #   Patient Details  Name: James Ibarra MRN: [id number] Date of Birth: 01-21-1951  Transition of Care [facility]) CM/SW Contact:  Darrold Span, RN Phone Number: 12/14/2022, 11:52 AM   Clinical Narrative:    Pt stable for transition home today, No DME needs, HHRNPT has been set up with [facility] per previous CM note.   Family to transport home.   No further TOC needs noted.    Final next level of care: Home w Home Health Services Barriers to Discharge: Barriers Resolved   Patient Goals and CMS Choice CMS Medicare.gov Compare Post Acute Care list provided to:: Patient Represenative (must comment) Choice offered to / list presented to : Spouse  Discharge Placement                   Home w/ [facility]      Discharge Plan and Services Additional resources added to the After Visit Summary for     Discharge Planning Services: CM Consult Post Acute Care Choice: Home Health          DME Arranged: N/A DME Agency: NA       HH Arranged: RN, PT HH Agency: [facility] Date HH Agency Contacted: 12/13/22 Time HH Agency Contacted: 1603 Representative spoke with at [facility] Agency: [facility] Sheldon  Social Determinants of Health (SDOH) Interventions SDOH Screenings   Food Insecurity: No Food Insecurity (08/12/2022)  Housing: Low Risk  (08/12/2022)  Transportation Needs: No Transportation Needs (08/12/2022)  Utilities: Not At Risk (08/12/2022)  Tobacco Use: Low Risk  (12/08/2022)     Readmission Risk Interventions    12/14/2022   11:51 AM  Readmission Risk Prevention Plan  Transportation Screening Complete  PCP or Specialist Appt within 5-7 Days --  Home Care Screening Complete  Medication Review (RN CM) Complete

## 2022-12-14 NOTE — Progress Notes (Signed)
Pt denies questions from yesterday's teaching, He is currently getting sutures taken out, will f/u to ambulate after.

## 2022-12-14 NOTE — Progress Notes (Signed)
Physical Therapy Treatment Patient Details Name: James Ibarra MRN: [id number] DOB: 1950-10-05 Today's Date: 12/14/2022   History of Present Illness Pt admitted on 10/4 for MVR, L atrial clipping, and maze procedure. PMH: HTN, HLD, afib, heart murmur    PT Comments  Pt received in supine and agreeable to session. Pt demonstrating good stability during ambulation with RW and reports access to a rollator at home. Pt able to tolerate gait and stair trials with supervision for safety. Pt demonstrates mild DOE, but doesn't require rest breaks. Education provided on activity progression and energy conservation. Anticipate pt and family will be able to manage pt's mobility needs at home once medically ready for discharge.     If plan is discharge home, recommend the following: A little help with walking and/or transfers;A little help with bathing/dressing/bathroom;Help with stairs or ramp for entrance;Assist for transportation   Can travel by private vehicle        Equipment Recommendations  Rolling walker (2 wheels)    Recommendations for Other Services       Precautions / Restrictions Precautions Precautions: Sternal Precaution Comments: pt able to state precautions vebally but requires verbal cues to adhere functionally Restrictions Weight Bearing Restrictions: Yes Other Position/Activity Restrictions: sternal precautions, limiting use of UEs     Mobility  Bed Mobility Overal bed mobility: Needs Assistance Bed Mobility: Supine to Sit     Supine to sit: Contact guard     General bed mobility comments: cues to maintain precautions, but no physical assist needed    Transfers Overall transfer level: Needs assistance Equipment used: None Transfers: Sit to/from Stand Sit to Stand: Supervision           General transfer comment: hands on knees and good power up    Ambulation/Gait Ambulation/Gait assistance: Supervision Gait Distance (Feet): 250 Feet Assistive device:  Rolling walker (2 wheels) Gait Pattern/deviations: Step-through pattern, Decreased stride length, Trunk flexed       General Gait Details: Steady gait with RW support. Cues for upright posture   Stairs Stairs: Yes Stairs assistance: Supervision Stair Management: Alternating pattern, Forwards, One rail Right Number of Stairs: 12 General stair comments: light use of rail for balance and supervision for safety       Balance Overall balance assessment: Mild deficits observed, not formally tested                                          Cognition Arousal: Alert Behavior During Therapy: WFL for tasks assessed/performed Overall Cognitive Status: Within Functional Limits for tasks assessed                                          Exercises      General Comments        Pertinent Vitals/Pain Pain Assessment Pain Assessment: Faces Faces Pain Scale: Hurts a little bit Pain Location: incision with coughing Pain Descriptors / Indicators: Sore, Discomfort Pain Intervention(s): Monitored during session     PT Goals (current goals can now be found in the care plan section) Acute Rehab PT Goals Patient Stated Goal: home PT Goal Formulation: With patient Time For Goal Achievement: 12/26/22 Progress towards PT goals: Progressing toward goals    Frequency    Min 1X/week  AM-PAC PT "6 Clicks" Mobility   Outcome Measure  Help needed turning from your back to your side while in a flat bed without using bedrails?: A Little Help needed moving from lying on your back to sitting on the side of a flat bed without using bedrails?: A Little Help needed moving to and from a bed to a chair (including a wheelchair)?: A Little Help needed standing up from a chair using your arms (e.g., wheelchair or bedside chair)?: A Little Help needed to walk in hospital room?: A Little Help needed climbing 3-5 steps with a railing? : A Little 6 Click  Score: 18    End of Session   Activity Tolerance: Patient tolerated treatment well Patient left: in bed;with nursing/sitter in room;with call bell/phone within reach Nurse Communication: Mobility status PT Visit Diagnosis: Unsteadiness on feet (R26.81);Muscle weakness (generalized) (M62.81)     Time: 1610-9604 PT Time Calculation (min) (ACUTE ONLY): 17 min  Charges:    $Gait Training: 8-22 mins PT General Charges $$ ACUTE PT VISIT: 1 Visit                     Johny Shock, PTA Acute Rehabilitation Services Secure Chat Preferred  [phone]    Johny Shock 12/14/2022, 9:46 AM

## 2022-12-14 NOTE — Care Management Important Message (Signed)
Important Message  Patient Details  Name: THAISON KOLODZIEJSKI MRN: [id number] Date of Birth: 1950-04-20   Important Message Given:  Yes - Medicare IM     Sherilyn Banker 12/14/2022, 3:48 PM

## 2022-12-14 NOTE — Progress Notes (Signed)
Went to explain discharge instructions. However, the patient prefers his wife to be present for discharge instructions due to the fact that she is his caretaker. Instructed the patient to call his wife so that he can be discharged.   Orvan Seen SWOT RN

## 2022-12-14 NOTE — Discharge Instructions (Signed)
Discharge Instructions:  1. You may shower, please wash incisions daily with soap and water and keep dry.  If you wish to cover wounds with dressing you may do so but please keep clean and change daily.  No tub baths or swimming until incisions have completely healed.  If your incisions become red or develop any drainage please call our office at [phone]  2. No Driving until cleared by [facility] office and you are no longer using narcotic pain medications  3. Monitor your weight daily.. Please use the same scale and weigh at same time... If you gain 5-10 lbs in 48 hours with associated lower extremity swelling, please contact our office at [phone]  4. Fever of 101.5 for at least 24 hours with no source, please contact our office at [phone]  5. Activity- up as tolerated, please walk at least 3 times per day.  Avoid strenuous activity, no lifting, pushing, or pulling with your arms over 8-10 lbs for a minimum of 6 weeks  6. If any questions or concerns arise, please do not hesitate to contact our office at [phone]

## 2022-12-14 NOTE — Progress Notes (Signed)
6 Days Post-Op Procedure(s) (LRB): MITRAL VALVE REPAIR (MVR) USING 34 MM SIMULUS SEMI- RIGID ANNULOPLASTY BAND (N/A) CLIPPING OF ATRIAL APPENDAGE (LAA) USING 50 MM ATRICLIP (Left) MAZE (N/A) TRANSESOPHAGEAL ECHOCARDIOGRAM (N/A) Subjective: Feels well, no concerns. Ate a full breakfast and said he took several walks in the hall yesterday.  On RA. He would like to return home today.   Objective: Vital signs in last 24 hours: Temp:  [97.6 F (36.4 C)-98.6 F (37 C)] 98.6 F (37 C) (10/10 0350) Pulse Rate:  [67-80] 74 (10/10 0350) Cardiac Rhythm: Normal sinus rhythm (10/09 1950) Resp:  [16-30] 16 (10/10 0350) BP: (122-159)/(76-97) 150/89 (10/10 0350) SpO2:  [94 %-100 %] 98 % (10/10 0350) FiO2 (%):  [21 %] 21 % (10/09 1314) Weight:  [65.6 kg] 65.6 kg (10/10 0553)    Intake/Output from previous day: 10/09 0701 - 10/10 0700 In: 960 [P.O.:960] Out: 1525 [Urine:1525] Intake/Output this shift: No intake/output data recorded.  General appearance: alert, cooperative, and no distress Neurologic: intact Heart: NSR, no significant arrhythmias Lungs: normal WOB on RA. Breath sounds clear. Abdomen: soft, NT Extremities: no peripheral edema Wound: the sternotomy incision is well approximated and dry  Lab Results: Recent Labs    12/12/22 0533 12/13/22 1501  WBC 6.7 7.0  HGB 7.8* 8.5*  HCT 24.3* 26.4*  PLT 165 240   BMET:  Recent Labs    12/13/22 1501 12/14/22 0352  NA 140 140  K 4.0 3.7  CL 109 106  CO2 23 25  GLUCOSE 109* 94  BUN 14 9  CREATININE 0.69 0.72  CALCIUM 9.0 8.7*    PT/INR: No results for input(s): "LABPROT", "INR" in the last 72 hours. ABG    Component Value Date/Time   PHART 7.348 (L) 12/08/2022 1700   HCO3 21.3 12/08/2022 1700   TCO2 22 12/08/2022 1700   ACIDBASEDEF 4.0 (H) 12/08/2022 1700   O2SAT 96 12/08/2022 1700   CBG (last 3)  Recent Labs    12/13/22 1123 12/13/22 1552 12/13/22 2142  GLUCAP 94 104* 95    Assessment/Plan: S/P  Procedure(s) (LRB): MITRAL VALVE REPAIR (MVR) USING 34 MM SIMULUS SEMI- RIGID ANNULOPLASTY BAND (N/A) CLIPPING OF ATRIAL APPENDAGE (LAA) USING 50 MM ATRICLIP (Left) MAZE (N/A) TRANSESOPHAGEAL ECHOCARDIOGRAM (N/A)  -POD6 MVR / LAA clip / MAZE. Stable VS. Independent with mobility.   -H/O PAF- maintaining SR, on Eliquis, amiodarone and metoprolol  -Expected acute blood loss anemia- HCT trending up, tolerating well.   -H/O HTN- control reasonable. May need further titration of medications in outpatient setting.   -PULM- On RA without dyspnea.  -Disposition- Discharge to home today. Instructions given, follow up and home health services arranged.     LOS: 6 days    Leary Roca, PA-C [phone] 12/14/2022

## 2022-12-17 DIAGNOSIS — I480 Paroxysmal atrial fibrillation: Secondary | ICD-10-CM | POA: Diagnosis not present

## 2022-12-17 DIAGNOSIS — Z556 Problems related to health literacy: Secondary | ICD-10-CM | POA: Diagnosis not present

## 2022-12-17 DIAGNOSIS — Z7982 Long term (current) use of aspirin: Secondary | ICD-10-CM | POA: Diagnosis not present

## 2022-12-17 DIAGNOSIS — I10 Essential (primary) hypertension: Secondary | ICD-10-CM | POA: Diagnosis not present

## 2022-12-17 DIAGNOSIS — Z48812 Encounter for surgical aftercare following surgery on the circulatory system: Secondary | ICD-10-CM | POA: Diagnosis not present

## 2022-12-17 DIAGNOSIS — E7800 Pure hypercholesterolemia, unspecified: Secondary | ICD-10-CM | POA: Diagnosis not present

## 2022-12-17 DIAGNOSIS — Z85828 Personal history of other malignant neoplasm of skin: Secondary | ICD-10-CM | POA: Diagnosis not present

## 2022-12-17 DIAGNOSIS — Z7901 Long term (current) use of anticoagulants: Secondary | ICD-10-CM | POA: Diagnosis not present

## 2022-12-18 ENCOUNTER — Other Ambulatory Visit: Payer: Self-pay | Admitting: Thoracic Surgery (Cardiothoracic Vascular Surgery)

## 2022-12-18 DIAGNOSIS — I340 Nonrheumatic mitral (valve) insufficiency: Secondary | ICD-10-CM

## 2022-12-18 NOTE — Progress Notes (Unsigned)
[address]       [place] [address]             [phone]   HPI: This is a 72 year old male who is s/p complex mitral valve repair utilizing triangular resection of P3 with closure of the clefts between the P2 and P3 segments and also  Between P3 and commisural scallop and placement of a 34 mm Simulus Semi Rigid Band, pulmonary vein isolation Maze with closure of the left atrial appendage with a 50 mm Atri clip, And PFO closure on 10/04 2024 by Dr. Leafy Ro. He was discharged on 12/14/2022.   Current Outpatient Medications  Medication Sig Dispense Refill   acetaminophen (TYLENOL) 325 MG tablet Take 2 tablets (650 mg total) by mouth every 6 (six) hours.     amiodarone (PACERONE) 200 MG tablet Take 1 tablet (200 mg total) by mouth daily. 30 tablet 2   apixaban (ELIQUIS) 5 MG TABS tablet Take 1 tablet (5 mg total) by mouth 2 (two) times daily. 60 tablet    Ascorbic Acid (VITAMIN C PO) Take 750 mg by mouth daily.     aspirin EC 81 MG tablet Take 1 tablet (81 mg total) by mouth daily. Swallow whole. 30 tablet 12   Cholecalciferol (VITAMIN D) 50 MCG (2000 UT) tablet Take 2,000 Units by mouth daily.     ferrous gluconate (FERGON) 324 MG tablet Take 1 tablet (324 mg total) by mouth daily with breakfast. 30 tablet 0   folic acid (FOLVITE) 1 MG tablet Take 1 mg by mouth daily.     gabapentin (NEURONTIN) 300 MG capsule TAKE 2 CAPSULES BY MOUTH 4 TIMES DAILY (Patient taking differently: Take 300 mg by mouth 2 (two) times daily.) 480 capsule 5   hydrocortisone 2.5 % ointment Apply 1 Application topically 2 (two) times daily as needed (itching).     ketoconazole (NIZORAL) 2 % cream Apply 1 Application topically as needed for irritation.     Magnesium 400 MG TABS Take 400 mg by mouth daily.     melatonin 3 MG TABS tablet Take 3 mg by mouth at bedtime as needed (sleep).     metoprolol tartrate (LOPRESSOR) 100 MG tablet Take 1 tablet (100 mg total) by mouth 2 (two) times daily.  100 tablet 5   Multiple Vitamins-Minerals (PRESERVISION AREDS 2) CAPS Take 1 capsule by mouth 2 (two) times daily.     olmesartan (BENICAR) 20 MG tablet Take 20 mg by mouth daily.     Polyethyl Glycol-Propyl Glycol (SYSTANE OP) Place 1 drop into both eyes daily as needed (dry eyes).     pravastatin (PRAVACHOL) 10 MG tablet Take 10 mg by mouth at bedtime.     sildenafil (REVATIO) 20 MG tablet Take 40-60 mg by mouth daily as needed (ED).     thiamine (VITAMIN B-1) 100 MG tablet Take 100 mg by mouth daily.     traMADol (ULTRAM) 50 MG tablet Take 1 tablet (50 mg total) by mouth every 6 (six) hours as needed for up to 5 days for moderate pain. Do not take at bedtime with the trazodone. 20 tablet 0   traZODone (DESYREL) 100 MG tablet Take 100-150 mg by mouth at bedtime.    Vital Signs:   Physical Exam: CV Pulmonary Abdomen Extremities Wound  Diagnostic Tests: ***  Impression and Plan: We discussed the results of the chest x ray. He will see cardiology on 10/30.  Endocarditis is a potentially serious  infection of heart valves or inside lining of the heart.  It occurs more commonly in patients with diseased heart valves (such as patient's with aortic or mitral valve disease) and in patients who have undergone heart valve repair or replacement.  Certain surgical and dental procedures may put you at risk, such as dental cleaning, other dental procedures, or any surgery involving the respiratory, urinary, gastrointestinal tract, gallbladder or prostate gland.   To minimize your chances for develooping endocarditis, maintain good oral health and seek prompt medical attention for any infections involving the mouth, teeth, gums, skin or urinary tract.    Always notify your doctor or dentist about your underlying heart valve condition before having any invasive procedures. You will need to take antibiotics before certain procedures, including all routine dental cleanings or other dental procedures.   Your cardiologist or dentist should prescribe these antibiotics for you to be taken ahead of time.     We will discuss driving and participation in cardiac rehab at the next office visit. He will return to see Dr. Leafy Ro in 2 weeks.  Ardelle Balls, PA-C [facility] [phone]

## 2022-12-18 NOTE — Progress Notes (Unsigned)
[address]       [place] [address]             [phone]

## 2022-12-19 ENCOUNTER — Ambulatory Visit (INDEPENDENT_AMBULATORY_CARE_PROVIDER_SITE_OTHER): Payer: Self-pay | Admitting: Physician Assistant

## 2022-12-19 ENCOUNTER — Ambulatory Visit
Admission: RE | Admit: 2022-12-19 | Discharge: 2022-12-19 | Disposition: A | Discharge status: Home / Self Care | Payer: Medicare Other | Source: Ambulatory Visit | Attending: Thoracic Surgery (Cardiothoracic Vascular Surgery) | Admitting: Thoracic Surgery (Cardiothoracic Vascular Surgery)

## 2022-12-19 ENCOUNTER — Telehealth (HOSPITAL_COMMUNITY): Payer: Self-pay

## 2022-12-19 VITALS — BP 132/76 | HR 59 | Resp 18 | Ht 73.0 in | Wt 154.0 lb

## 2022-12-19 DIAGNOSIS — J90 Pleural effusion, not elsewhere classified: Secondary | ICD-10-CM | POA: Diagnosis not present

## 2022-12-19 DIAGNOSIS — I340 Nonrheumatic mitral (valve) insufficiency: Secondary | ICD-10-CM

## 2022-12-19 DIAGNOSIS — R0602 Shortness of breath: Secondary | ICD-10-CM | POA: Diagnosis not present

## 2022-12-19 MED ORDER — FUROSEMIDE 40 MG PO TABS
40.0000 mg | ORAL_TABLET | Freq: Every day | ORAL | 0 refills | Status: DC
Start: 2022-12-19 — End: 2022-12-26

## 2022-12-19 MED ORDER — POTASSIUM CHLORIDE CRYS ER 20 MEQ PO TBCR: 20.0000 meq | EXTENDED_RELEASE_TABLET | Freq: Every day | ORAL | 0 refills | Status: DC

## 2022-12-19 MED ORDER — METOPROLOL TARTRATE 50 MG PO TABS: 50.0000 mg | ORAL_TABLET | Freq: Two times a day (BID) | ORAL | 1 refills | Status: DC

## 2022-12-19 NOTE — Telephone Encounter (Signed)
Pt insurance is active and benefits verified through [facility]. Co-pay $0.00, DED $0.00/$0.00 met, out of pocket $5,750.00/$2,958.80 met, co-insurance 0%. No pre-authorization required. Passport, 12/19/22 @ 12:23PM, REF#[id number]   How many CR sessions are covered? (36 visits for TCR, 72 visits for ICR)72 Is this a lifetime maximum or an annual maximum? Annual Has the member used any of these services to date? No Is there a time limit (weeks/months) on start of program and/or program completion? No     Will contact patient to see if he is interested in the Cardiac Rehab Program. If interested, patient will need to complete follow up appt. Once completed, patient will be contacted for scheduling upon review by the RN Navigator.

## 2022-12-19 NOTE — Telephone Encounter (Signed)
Called patient to see if he is interested in the Cardiac Rehab Program. Patient expressed interest. Explained scheduling process and went over insurance, patient verbalized understanding. Will contact patient for scheduling once f/u has been completed.  °

## 2022-12-19 NOTE — Patient Instructions (Signed)
Please decrease Metoprolol Tartrate to 50 mg bid. Please take BP every morning along with HR and keep record. Please bring this record to next appointment. If SBP> or = to 150, please contact office Please start Lasix 40 mg daily and potassium 20 meq daily on 10/16 and take for the next week

## 2022-12-20 ENCOUNTER — Encounter: Payer: Self-pay | Admitting: Physician Assistant

## 2022-12-20 DIAGNOSIS — Z7901 Long term (current) use of anticoagulants: Secondary | ICD-10-CM | POA: Diagnosis not present

## 2022-12-20 DIAGNOSIS — Z7982 Long term (current) use of aspirin: Secondary | ICD-10-CM | POA: Diagnosis not present

## 2022-12-20 DIAGNOSIS — I10 Essential (primary) hypertension: Secondary | ICD-10-CM | POA: Diagnosis not present

## 2022-12-20 DIAGNOSIS — E7800 Pure hypercholesterolemia, unspecified: Secondary | ICD-10-CM | POA: Diagnosis not present

## 2022-12-20 DIAGNOSIS — Z9889 Other specified postprocedural states: Secondary | ICD-10-CM | POA: Diagnosis not present

## 2022-12-20 DIAGNOSIS — Z85828 Personal history of other malignant neoplasm of skin: Secondary | ICD-10-CM | POA: Diagnosis not present

## 2022-12-20 DIAGNOSIS — Z556 Problems related to health literacy: Secondary | ICD-10-CM | POA: Diagnosis not present

## 2022-12-20 DIAGNOSIS — Z48812 Encounter for surgical aftercare following surgery on the circulatory system: Secondary | ICD-10-CM | POA: Diagnosis not present

## 2022-12-20 DIAGNOSIS — I340 Nonrheumatic mitral (valve) insufficiency: Secondary | ICD-10-CM | POA: Diagnosis not present

## 2022-12-20 DIAGNOSIS — Z09 Encounter for follow-up examination after completed treatment for conditions other than malignant neoplasm: Secondary | ICD-10-CM | POA: Diagnosis not present

## 2022-12-20 DIAGNOSIS — I480 Paroxysmal atrial fibrillation: Secondary | ICD-10-CM | POA: Diagnosis not present

## 2022-12-20 NOTE — Progress Notes (Signed)
[address]       [place] [address]             [phone]       HPI: Patient returns for 1 week follow up.  This is a 72 year old male who is s/p complex mitral valve repair utilizing triangular resection of P3 with closure of the clefts between the P2 and P3 segments and also Between P3 and commisural scallop and placement of a 34 mm Simulus Semi Rigid Band, pulmonary vein isolation Maze with closure of the left atrial appendage with a 50 mm Atri clip, And PFO closure on 10/04 2024 by Dr. Leafy Ro. on 12/08/2022.  He evaluated in our office 1 week ago at which time he felt run down and short of breath with exertion at times.  He was noted to have a new right sided pleural effusion and it was recommended to try a short course of Lasix, as for the most part the patient was asymptomatic from this.  Today  patient is doing well.  He denies shortness of breath.  He states he would like to push himself more on his activity level.  He has completed course of Lasix.  Current Outpatient Medications  Medication Sig Dispense Refill   acetaminophen (TYLENOL) 325 MG tablet Take 2 tablets (650 mg total) by mouth every 6 (six) hours.     amiodarone (PACERONE) 200 MG tablet Take 1 tablet (200 mg total) by mouth daily. 30 tablet 2   apixaban (ELIQUIS) 5 MG TABS tablet Take 1 tablet (5 mg total) by mouth 2 (two) times daily. 60 tablet    Ascorbic Acid (VITAMIN C PO) Take 750 mg by mouth daily.     aspirin EC 81 MG tablet Take 1 tablet (81 mg total) by mouth daily. Swallow whole. 30 tablet 12   Cholecalciferol (VITAMIN D) 50 MCG (2000 UT) tablet Take 2,000 Units by mouth daily.     ferrous gluconate (FERGON) 324 MG tablet Take 1 tablet (324 mg total) by mouth daily with breakfast. 30 tablet 0   folic acid (FOLVITE) 1 MG tablet Take 1 mg by mouth daily.     furosemide (LASIX) 40 MG tablet Take 1 tablet (40 mg total) by mouth daily. For one week then stop. 7 tablet 0   gabapentin  (NEURONTIN) 300 MG capsule TAKE 2 CAPSULES BY MOUTH 4 TIMES DAILY (Patient taking differently: Take 300 mg by mouth 2 (two) times daily.) 480 capsule 5   hydrocortisone 2.5 % ointment Apply 1 Application topically 2 (two) times daily as needed (itching).     ketoconazole (NIZORAL) 2 % cream Apply 1 Application topically as needed for irritation.     Magnesium 400 MG TABS Take 400 mg by mouth daily.     melatonin 3 MG TABS tablet Take 3 mg by mouth at bedtime as needed (sleep).     metoprolol tartrate (LOPRESSOR) 50 MG tablet Take 1 tablet (50 mg total) by mouth 2 (two) times daily. 60 tablet 1   Multiple Vitamins-Minerals (PRESERVISION AREDS 2) CAPS Take 1 capsule by mouth 2 (two) times daily.     olmesartan (BENICAR) 20 MG tablet Take 20 mg by mouth daily.     Polyethyl Glycol-Propyl Glycol (SYSTANE OP) Place 1 drop into both eyes daily as needed (dry eyes).     potassium chloride SA (KLOR-CON M) 20 MEQ tablet Take 1 tablet (20 mEq total) by mouth daily. For one week then  stop 7 tablet 0   pravastatin (PRAVACHOL) 10 MG tablet Take 10 mg by mouth at bedtime.     sildenafil (REVATIO) 20 MG tablet Take 40-60 mg by mouth daily as needed (ED).     thiamine (VITAMIN B-1) 100 MG tablet Take 100 mg by mouth daily.     traZODone (DESYREL) 100 MG tablet Take 100-150 mg by mouth at bedtime.     No current facility-administered medications for this visit.    Physical Exam:  BP 98/62 (BP Location: Right Arm)   Pulse 60   Resp 18   Ht 6\' 1"  (1.854 m)   Wt 149 lb (67.6 kg)   SpO2 95% Comment: RA  BMI 19.66 kg/m   Gen: NAD Heart: RRR Lungs: CTA bilaterally Ext: no edema present  Diagnostic Tests:  CXR: trace left pleural effusion, much improved from previous CXR  A/P:  S/P complex mitral valve repair utilizing triangular resection of P3 with closure of the clefts between the P2 and P3 segments and also Between P3 and commisural scallop and placement of a 34 mm Simulus Semi Rigid Band,  pulmonary vein isolation Maze with closure of the left atrial appendage with a 50 mm Atri clip, And PFO closure Right Pleural Effusion- he has completed course of Lasix, repeat CXR shows minimal pleural effusion, will give prn Lasix, potassium script RTC to see PWW in 1-2 weeks  Lowella Dandy, PA-C [facility] [phone]

## 2022-12-20 NOTE — Progress Notes (Signed)
This is a 72 year old male who is s/p complex mitral valve repair utilizing triangular resection of P3 with closure of the clefts between the P2 and P3 segments and also Between P3 and commisural scallop and placement of a 34 mm Simulus Semi Rigid Band, pulmonary vein isolation Maze with closure of the left atrial appendage with a 50 mm Atri clip, And PFO closure on 10/04 2024 by Dr. Leafy Ro.

## 2022-12-25 ENCOUNTER — Other Ambulatory Visit: Payer: Self-pay | Admitting: Thoracic Surgery (Cardiothoracic Vascular Surgery)

## 2022-12-25 DIAGNOSIS — Z7901 Long term (current) use of anticoagulants: Secondary | ICD-10-CM | POA: Diagnosis not present

## 2022-12-25 DIAGNOSIS — Z7982 Long term (current) use of aspirin: Secondary | ICD-10-CM | POA: Diagnosis not present

## 2022-12-25 DIAGNOSIS — I10 Essential (primary) hypertension: Secondary | ICD-10-CM | POA: Diagnosis not present

## 2022-12-25 DIAGNOSIS — Z556 Problems related to health literacy: Secondary | ICD-10-CM | POA: Diagnosis not present

## 2022-12-25 DIAGNOSIS — I340 Nonrheumatic mitral (valve) insufficiency: Secondary | ICD-10-CM

## 2022-12-25 DIAGNOSIS — E7800 Pure hypercholesterolemia, unspecified: Secondary | ICD-10-CM | POA: Diagnosis not present

## 2022-12-25 DIAGNOSIS — I480 Paroxysmal atrial fibrillation: Secondary | ICD-10-CM | POA: Diagnosis not present

## 2022-12-25 DIAGNOSIS — Z85828 Personal history of other malignant neoplasm of skin: Secondary | ICD-10-CM | POA: Diagnosis not present

## 2022-12-25 DIAGNOSIS — Z48812 Encounter for surgical aftercare following surgery on the circulatory system: Secondary | ICD-10-CM | POA: Diagnosis not present

## 2022-12-26 ENCOUNTER — Ambulatory Visit
Admission: RE | Admit: 2022-12-26 | Discharge: 2022-12-26 | Disposition: A | Discharge status: Home / Self Care | Payer: Medicare Other | Source: Ambulatory Visit | Attending: Thoracic Surgery (Cardiothoracic Vascular Surgery) | Admitting: Thoracic Surgery (Cardiothoracic Vascular Surgery)

## 2022-12-26 ENCOUNTER — Ambulatory Visit (INDEPENDENT_AMBULATORY_CARE_PROVIDER_SITE_OTHER): Payer: Self-pay | Admitting: Physician Assistant

## 2022-12-26 VITALS — BP 98/62 | HR 60 | Resp 18 | Ht 73.0 in | Wt 149.0 lb

## 2022-12-26 DIAGNOSIS — I340 Nonrheumatic mitral (valve) insufficiency: Secondary | ICD-10-CM

## 2022-12-26 DIAGNOSIS — Z951 Presence of aortocoronary bypass graft: Secondary | ICD-10-CM | POA: Diagnosis not present

## 2022-12-26 DIAGNOSIS — J90 Pleural effusion, not elsewhere classified: Secondary | ICD-10-CM | POA: Diagnosis not present

## 2022-12-26 DIAGNOSIS — Z9889 Other specified postprocedural states: Secondary | ICD-10-CM

## 2022-12-26 MED ORDER — POTASSIUM CHLORIDE CRYS ER 20 MEQ PO TBCR: 20.0000 meq | EXTENDED_RELEASE_TABLET | Freq: Every day | ORAL | 0 refills | Status: DC | PRN

## 2022-12-26 MED ORDER — FUROSEMIDE 40 MG PO TABS: 40.0000 mg | ORAL_TABLET | Freq: Every day | ORAL | 0 refills | Status: DC | PRN

## 2023-01-03 ENCOUNTER — Ambulatory Visit: Payer: Medicare Other | Attending: Cardiology | Admitting: Cardiology

## 2023-01-03 ENCOUNTER — Encounter: Payer: Self-pay | Admitting: Cardiology

## 2023-01-03 ENCOUNTER — Ambulatory Visit (INDEPENDENT_AMBULATORY_CARE_PROVIDER_SITE_OTHER): Payer: Medicare Other

## 2023-01-03 VITALS — BP 132/70 | HR 61 | Ht 73.0 in | Wt 154.0 lb

## 2023-01-03 DIAGNOSIS — I10 Essential (primary) hypertension: Secondary | ICD-10-CM

## 2023-01-03 DIAGNOSIS — E7849 Other hyperlipidemia: Secondary | ICD-10-CM | POA: Diagnosis not present

## 2023-01-03 DIAGNOSIS — I480 Paroxysmal atrial fibrillation: Secondary | ICD-10-CM

## 2023-01-03 DIAGNOSIS — Z9889 Other specified postprocedural states: Secondary | ICD-10-CM | POA: Diagnosis not present

## 2023-01-03 DIAGNOSIS — I341 Nonrheumatic mitral (valve) prolapse: Secondary | ICD-10-CM | POA: Diagnosis not present

## 2023-01-03 NOTE — Progress Notes (Signed)
Cardiology Office Note:   Date:  01/03/2023  ID:  James Ibarra, DOB 11-11-1950, MRN [id number] PCP: Farris Has, MD  [facility] Providers Cardiologist:  [name], MD    History of Present Illness:   Discussed the use of AI scribe software for clinical note transcription with the patient, who gave verbal consent to proceed.  History of Present Illness   72 year old patient with a history of severe mitral regurgitation, paroxysmal atrial fibrillation on Eliquis, hyperlipidemia, hypertension, trigeminal neuralgia.  Patient found with heart murmur earlier this year by PCP. Echo noted severe MR and referral placed to cardiology. Just a few days prior to the originally scheduled visit, admitted this summer with acute hypoxic respiratory failure secondary to viral pneumonia with superimposed bacterial infection. This admission also complicated by afib (resolved prior to discharge) and alcohol withdrawal. Discharged on Eliquis and Metoprolol. Following discharge, patient saw Dr. Lynnette Caffey on 10/04/22. At that office visit, patient noted that he'd had palpitations, especially when laying on his left side for several months prior to admission and had known about murmur since at least 6 months prior.   Given possibility that afib could have been a result of MR or acute/severe infection, heart monitor arranged. This revealed no atrial fibrillation or sustained ventricular tachyarrhythmias. TEE, LHC/RHC also arranged. 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 Given ongoing dyspnea on exertion with severe MR and recent afib, decision made to undergo surgical repair with maze procedure and clipping of LA appendage. This surgery was successfully completed on 10/4. Patient developed post procedure afib that was treated with amiodarone. At discharge he was transitioned to PO amiodarone. At follow up with CT surgery on  10/15, it was noted that patient had increased right pleural effusion on CXR and he was given Lasix 40mg . Lopressor also reduced to 50mg  BID due to bradycardia. Patient seen again by CT surgery 10/22 after completing 1 week of lasix and had no shortness of breath. Lasix transitioned to PRN for weight gain at that visit.   Today the patient reports feeling generally well since the valve surgery, with only minor pain associated with the incision site. He admits to possibly overexerting himself recently, leading to increased localized pain on the right side of his chest near incision. He denies redness or other signs of infection.   Since discharging, the patient has not experienced any palpitations or other symptoms suggestive of atrial fibrillation. The patient has been trying to regain weight lost after the surgery and has been walking three times a day for exercise. He reports some decreased exertional tolerance and increased fatigue following his surgery but this is getting better.  The patient has been monitoring his blood pressure at home, noting some lower readings, but has not experienced any associated symptoms such as dizziness. He has been taking metoprolol twice a day, with the dose recently reduced from 100mg  to 50mg  twice a day by a PA. The patient has not noticed any significant changes in how he feels since the dose reduction.  In summary, the patient is 26 days s/p mitral valve repair with maze and LA appendage clipping, with minor incision site pain and transient decreased exertional tolerance. He is currently on oral amiodarone for postoperative atrial fibrillation and has recently been on a diuretic due to right side pleural effusion. The patient is actively monitoring his blood pressure and is committed to a regular walking regimen for exercise.  Studies Reviewed:    Cardiac Studies & Procedures   CARDIAC CATHETERIZATION  CARDIAC CATHETERIZATION 10/12/2022  Narrative 1.   Very minimal luminal abnormalities of LAD with otherwise normal right dominant circulation. 2.  Fick cardiac output of 4.9 L/min and Fick cardiac index of 2.6 L/min/m with the following hemodynamics: Right atrial pressure mean of 9 mmHg Right ventricular pressure 36/5 with an end-diastolic pressure of 18 mmHg Mean wedge pressure of 18 mmHg with V waves to 25 mmHg PA pressure of 38/16 with a mean of 23 mmHg PVR of less than 3 Woods units 3.  LVEDP of 26 mmHg.  Recommendation: Surgical opinion for degenerative mitral regurgitation and atrial fibrillation.  Findings Coronary Findings Diagnostic  Dominance: Right  Left Anterior Descending The vessel exhibits minimal luminal irregularities.  Intervention  No interventions have been documented.     ECHOCARDIOGRAM  ECHOCARDIOGRAM COMPLETE 06/30/2022  IMPRESSIONS   1. Severe prolapse of posterior MV leaflet with eccentric MR; likely severe; suggest TEE to further assess. 2. Left ventricular ejection fraction, by estimation, is 60 to 65%. The left ventricle has normal function. The left ventricle has no regional wall motion abnormalities. Left ventricular diastolic function could not be evaluated. The average left ventricular global longitudinal strain is -21.3 %. The global longitudinal strain is normal. 3. Right ventricular systolic function is normal. The right ventricular size is mildly enlarged. 4. Left atrial size was moderately dilated. 5. The mitral valve is normal in structure. Severe mitral valve regurgitation. No evidence of mitral stenosis. There is severe holosystolic prolapse of posterior leaflet of the mitral valve. 6. The aortic valve is tricuspid. Aortic valve regurgitation is not visualized. Aortic valve sclerosis is present, with no evidence of aortic valve stenosis. 7. Aortic dilatation noted. There is borderline dilatation of the aortic root, measuring 39 mm. There is mild dilatation of the ascending aorta, measuring  42 mm. 8. The inferior vena cava is normal in size with greater than 50% respiratory variability, suggesting right atrial pressure of 3 mmHg.  Comparison(s): No prior Echocardiogram.  FINDINGS Left Ventricle: Left ventricular ejection fraction, by estimation, is 60 to 65%. The left ventricle has normal function. The left ventricle has no regional wall motion abnormalities. The average left ventricular global longitudinal strain is -21.3 %. The global longitudinal strain is normal. The left ventricular internal cavity size was normal in size. There is no left ventricular hypertrophy. Left ventricular diastolic function could not be evaluated.  Right Ventricle: The right ventricular size is mildly enlarged. Right ventricular systolic function is normal.  Left Atrium: Left atrial size was moderately dilated.  Right Atrium: Right atrial size was normal in size.  Pericardium: There is no evidence of pericardial effusion.  Mitral Valve: The mitral valve is normal in structure. There is severe holosystolic prolapse of posterior leaflet of the mitral valve. Severe mitral valve regurgitation. No evidence of mitral valve stenosis.  Tricuspid Valve: The tricuspid valve is normal in structure. Tricuspid valve regurgitation is mild . No evidence of tricuspid stenosis.  Aortic Valve: The aortic valve is tricuspid. Aortic valve regurgitation is not visualized. Aortic valve sclerosis is present, with no evidence of aortic valve stenosis.  Pulmonic Valve: The pulmonic valve was normal in structure. Pulmonic valve regurgitation is trivial. No evidence of pulmonic stenosis.  Aorta: Aortic dilatation noted. There is borderline dilatation of the aortic root, measuring 39 mm. There is mild dilatation of the ascending aorta, measuring 42 mm.  Venous: The inferior vena cava is normal in size  with greater than 50% respiratory variability, suggesting right atrial pressure of 3 mmHg.  IAS/Shunts: No atrial level  shunt detected by color flow Doppler.  Additional Comments: Severe prolapse of posterior MV leaflet with eccentric MR; likely severe; suggest TEE to further assess.    TEE  ECHO INTRAOPERATIVE TEE 12/08/2022  Narrative *INTRAOPERATIVE TRANSESOPHAGEAL REPORT *    Patient Name:   MARQUALE PAVLOCK Date of Exam: 12/08/2022 Medical Rec #:  [id number]       Height:       73.0 in Accession #:    [id number]      Weight:       156.0 lb Date of Birth:  1950/07/18       BSA:          1.94 m Patient Age:    72 years        BP:           147/79 mmHg Patient Gender: M               HR:           48 bpm. Exam Location:  Inpatient  Transesophogeal exam was perform intraoperatively during surgical procedure. Patient was closely monitored under general anesthesia during the entirety of examination.  Indications:     mitral valve repair. Performing Phys: [id number] Eugenio Hoes Diagnosing Phys: Anice Paganini  Complications: No known complications during this procedure. POST-OP IMPRESSIONS _ Left Ventricle: The left ventricle is unchanged from pre-bypass. Normal systolic function. _ Right Ventricle: The right ventricular function is mildly reduced. _ Aorta: The aorta appears unchanged from pre-bypass there is no dissection present in the aorta. _ Aortic Valve: The aortic valve appears unchanged from pre-bypass. _ Mitral Valve: There is an annuloplasty ring in the mitral position. Following mitral valve repair, there is no residual mitral regurgitation. Mean PG 2 mmHg. _ Tricuspid Valve: The tricuspid valve appears unchanged from pre-bypass. _ Pulmonic Valve: The pulmonic valve appears unchanged from pre-bypass. _ Pericardium: The pericardium appears unchanged from pre-bypass. _ Comments:  Following Mitral valve repair, there is no residual mitral regurgitation, there is no mitral stenosis. The left ventricular function is normal. Right ventricular function is mildly reduced. The remainder of the  valvular exam is unchanged from pre-bypass examination.  PRE-OP FINDINGS Left Ventricle: The left ventricle has normal systolic function, with an ejection fraction of 57.6% by Simpson's biplane method of discs.   Right Ventricle: The right ventricle has normal systolic function. The cavity was mildly dialated.  Left Atrium: Left atrial size was dilated. No left atrial/left atrial appendage thrombus was detected.  Interatrial Septum: There is a patent foramen ovale with a left to right shunt.  Pericardium: There is no evidence of pericardial effusion. There is no pleural effusion.  Mitral Valve: The mitral valve is myxomatous. There is prolapse of the P3 segment of the posterior mitral leaflet with moderate to severe regurgitation by color flow Doppler. The jet is eccentrically directed towards the anterior wall of the left atrium. There is No evidence of mitral stenosis.  Tricuspid Valve: The tricuspid valve was normal in structure. Tricuspid valve regurgitation is trivial and is isolated to pulmonary artery catheter site.  Aortic Valve: The aortic valve is normal in structure. 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 Aortic valve regurgitation was not assessed by color flow Doppler. There is no stenosis of the aortic valve, with a calculated valve area of 3.39 cm.   Pulmonic Valve: The pulmonic  valve was normal in structure. Pulmonic valve regurgitation is trivial by color flow Doppler.   Aorta: The aortic root, ascending aorta and aortic arch are normal in size and structure.  Pulmonary Artery: Theone Murdoch catheter vizualized past pulmonic valve in the main PA. The pulmonary artery is of normal size. MONITORS  LONG TERM MONITOR (3-14 DAYS) 10/20/2022  Narrative Patch Wear Time:  13 days and 1 hours (2024-07-31T10:32:10-398 to 2024-08-13T11:43:18-0400)  Patient had a min HR of 39 bpm, max HR of 182 bpm, and avg HR of 55 bpm. Predominant underlying rhythm  was Sinus Rhythm.  EVENTS: -1 run of Ventricular Tachycardia occurred lasting 9 beats with a max rate of 182 bpm (avg 147 bpm).  -18 Supraventricular Tachycardia runs occurred, the run with the fastest interval lasting 6 beats with a max rate of 167 bpm, the longest lasting 15 beats with an avg rate of 116 bpm.  -Isolated SVEs were rare (<1.0%), SVE Couplets were rare (<1.0%), and SVE Triplets were rare (<1.0%).  -Isolated VEs were rare (<1.0%, 164), VE Couplets were rare (<1.0%, 7), and VE Triplets were rare (<1.0%, 1). Ventricular Trigeminy was present.  No atrial fibrillation, sustained ventricular tachyarrhythmias, or bradyarrhythmias were detected.  Patient triggered events corresponded with sinus rhythm.            Risk Assessment/Calculations:    CHA2DS2-VASc Score = 2   This indicates a 2.2% annual risk of stroke. The patient's score is based upon: CHF History: 0 HTN History: 1 Diabetes History: 0 Stroke History: 0 Vascular Disease History: 0 Age Score: 1 Gender Score: 0             Physical Exam:   VS:  BP 132/70   Pulse 61   Ht 6\' 1"  (1.854 m)   Wt 154 lb (69.9 kg)   SpO2 97%   BMI 20.32 kg/m    Wt Readings from Last 3 Encounters:  01/03/23 154 lb (69.9 kg)  12/26/22 149 lb (67.6 kg)  12/19/22 154 lb (69.9 kg)     Physical Exam Vitals reviewed.  Constitutional:      Appearance: Normal appearance.  HENT:     Head: Normocephalic.  Eyes:     Pupils: Pupils are equal, round, and reactive to light.  Cardiovascular:     Rate and Rhythm: Normal rate and regular rhythm.     Pulses: Normal pulses.     Heart sounds: Normal heart sounds.  Pulmonary:     Effort: Pulmonary effort is normal.     Breath sounds: Normal breath sounds.  Abdominal:     General: Abdomen is flat.     Palpations: Abdomen is soft.  Musculoskeletal:     Right lower leg: No edema.     Left lower leg: No edema.  Skin:    General: Skin is warm and dry.     Capillary Refill:  Capillary refill takes less than 2 seconds.     Comments: Sternotomy closure healing well. No redness or discharge.  Neurological:     General: No focal deficit present.     Mental Status: He is alert and oriented to person, place, and time.  Psychiatric:        Mood and Affect: Mood normal.        Behavior: Behavior normal.        Thought Content: Thought content normal.        Judgment: Judgment normal.    ASSESSMENT AND PLAN:  Severe MR s/p surgical repair with MAZE and LAA clip Post-operative atrial fibrillation Recovering well post-surgery with no significant symptoms. Noted transient post-operative atrial fibrillation treated with amiodarone. -Discontinue amiodarone today.  -Arrange 14 day heart monitor to assess for recurrent atrial fibrillation. -Given MAZE and LAA clip, if no recurrent atrial fibrillation on heart monitor, would consider discontinuation of eliquis.  Hypertension Stable blood pressure today. Patient reports lower readings at home. -Continue Metoprolol Tartrate 50mg  twice daily and Olmesartan 20mg  daily. -Advise patient to continue to monitor blood pressure at home  Pleural Effusion Clear lung sounds on exam today without diminished breath sounds to indicate significant effusion.  -Reasonable to continue as-needed Lasix prescribed by CT surgery for weight gain of more than 3-5 pounds in 24 hours or more than 5 pounds over a week.  Hyperlipidemia LDL 83 as of July 2024. -Continue Pravastatin 10mg   Follow-up -Echocardiogram on November 15th. -Plan for follow-up appointment in 2-3 months.           Cardiac Rehabilitation Eligibility Assessment  The patient is ready to start cardiac rehabilitation from a cardiac standpoint.        Signed, Perlie Gold, PA-C

## 2023-01-03 NOTE — Progress Notes (Unsigned)
Enrolled for [employer] to mail a ZIO XT long term holter monitor to the patients address on file. Requested monitor to ship on 01/05/23.  Patient to wear next week.  Dr. Lynnette Caffey to read.

## 2023-01-03 NOTE — Patient Instructions (Addendum)
Medication Instructions:  STOP Amiodarone  *If you need a refill on your cardiac medications before your next appointment, please call your pharmacy*   Lab Work: None ordered   Testing/Procedures: Put on next week ZIO XT- Long Term Monitor Instructions  Your physician has requested you wear a ZIO patch monitor for 14 days.  This is a single patch monitor. [employer] supplies one patch monitor per enrollment. Additional stickers are not available. Please do not apply patch if you will be having a Nuclear Stress Test,  Echocardiogram, Cardiac CT, MRI, or Chest Xray during the period you would be wearing the  monitor. The patch cannot be worn during these tests. You cannot remove and re-apply the  ZIO XT patch monitor.  Your ZIO patch monitor will be mailed 3 day USPS to your address on file. It may take 3-5 days  to receive your monitor after you have been enrolled.  Once you have received your monitor, please review the enclosed instructions. Your monitor  has already been registered assigning a specific monitor serial # to you.  Billing and Patient Assistance Program Information  We have supplied [employer] with any of your insurance information on file for billing purposes. [employer] offers a sliding scale Patient Assistance Program for patients that do not have  insurance, or whose insurance does not completely cover the cost of the ZIO monitor.  You must apply for the Patient Assistance Program to qualify for this discounted rate.  To apply, please call [employer] at [phone], select option 4, select option 2, ask to apply for  Patient Assistance Program. Meredeth Ide will ask your household income, and how many people  are in your household. They will quote your out-of-pocket cost based on that information.  [employer] will also be able to set up a 14-month, interest-free payment plan if needed.  Applying the monitor   Shave hair from upper left chest.  Hold abrader disc by orange tab. Rub  abrader in 40 strokes over the upper left chest as  indicated in your monitor instructions.  Clean area with 4 enclosed alcohol pads. Let dry.  Apply patch as indicated in monitor instructions. Patch will be placed under collarbone on left  side of chest with arrow pointing upward.  Rub patch adhesive wings for 2 minutes. Remove white label marked "1". Remove the white  label marked "2". Rub patch adhesive wings for 2 additional minutes.  While looking in a mirror, press and release button in center of patch. A small green light will  flash 3-4 times. This will be your only indicator that the monitor has been turned on.  Do not shower for the first 24 hours. You may shower after the first 24 hours.  Press the button if you feel a symptom. You will hear a small click. Record Date, Time and  Symptom in the Patient Logbook.  When you are ready to remove the patch, follow instructions on the last 2 pages of Patient  Logbook. Stick patch monitor onto the last page of Patient Logbook.  Place Patient Logbook in the blue and white box. Use locking tab on box and tape box closed  securely. The blue and white box has prepaid postage on it. Please place it in the mailbox as  soon as possible. Your physician should have your test results approximately 7 days after the  monitor has been mailed back to [facility].  Call [facility] Customer Care at [phone] if you have questions regarding  your ZIO XT  patch monitor. Call them immediately if you see an orange light blinking on your  monitor.  If your monitor falls off in less than 4 days, contact our Monitor department at [phone].  If your monitor becomes loose or falls off after 4 days call [employer] at [phone] for  suggestions on securing your monitor   Follow-Up: At [facility], you and your health needs are our priority.  As part of our continuing mission to provide you with exceptional heart care, we have created  designated Provider Care Teams.  These Care Teams include your primary Cardiologist (physician) and Advanced Practice Providers (APPs -  Physician Assistants and Nurse Practitioners) who all work together to provide you with the care you need, when you need it.  We recommend signing up for the patient portal called "MyChart".  Sign up information is provided on this After Visit Summary.  MyChart is used to connect with patients for Virtual Visits (Telemedicine).  Patients are able to view lab/test results, encounter notes, upcoming appointments, etc.  Non-urgent messages can be sent to your provider as well.   To learn more about what you can do with MyChart, go to [email or web address].    Your next appointment:   2 month(s)  Provider:   [name], MD  or APP   Other Instructions None ordered

## 2023-01-06 ENCOUNTER — Other Ambulatory Visit: Payer: Self-pay | Admitting: Physician Assistant

## 2023-01-06 DIAGNOSIS — Z556 Problems related to health literacy: Secondary | ICD-10-CM | POA: Diagnosis not present

## 2023-01-06 DIAGNOSIS — I10 Essential (primary) hypertension: Secondary | ICD-10-CM | POA: Diagnosis not present

## 2023-01-06 DIAGNOSIS — Z7901 Long term (current) use of anticoagulants: Secondary | ICD-10-CM | POA: Diagnosis not present

## 2023-01-06 DIAGNOSIS — I480 Paroxysmal atrial fibrillation: Secondary | ICD-10-CM | POA: Diagnosis not present

## 2023-01-06 DIAGNOSIS — Z48812 Encounter for surgical aftercare following surgery on the circulatory system: Secondary | ICD-10-CM | POA: Diagnosis not present

## 2023-01-06 DIAGNOSIS — E7800 Pure hypercholesterolemia, unspecified: Secondary | ICD-10-CM | POA: Diagnosis not present

## 2023-01-06 DIAGNOSIS — Z85828 Personal history of other malignant neoplasm of skin: Secondary | ICD-10-CM | POA: Diagnosis not present

## 2023-01-06 DIAGNOSIS — Z7982 Long term (current) use of aspirin: Secondary | ICD-10-CM | POA: Diagnosis not present

## 2023-01-07 DIAGNOSIS — I480 Paroxysmal atrial fibrillation: Secondary | ICD-10-CM | POA: Diagnosis not present

## 2023-01-10 DIAGNOSIS — I10 Essential (primary) hypertension: Secondary | ICD-10-CM | POA: Diagnosis not present

## 2023-01-10 DIAGNOSIS — E7800 Pure hypercholesterolemia, unspecified: Secondary | ICD-10-CM | POA: Diagnosis not present

## 2023-01-10 DIAGNOSIS — Z556 Problems related to health literacy: Secondary | ICD-10-CM | POA: Diagnosis not present

## 2023-01-10 DIAGNOSIS — Z85828 Personal history of other malignant neoplasm of skin: Secondary | ICD-10-CM | POA: Diagnosis not present

## 2023-01-10 DIAGNOSIS — Z7901 Long term (current) use of anticoagulants: Secondary | ICD-10-CM | POA: Diagnosis not present

## 2023-01-10 DIAGNOSIS — I480 Paroxysmal atrial fibrillation: Secondary | ICD-10-CM | POA: Diagnosis not present

## 2023-01-10 DIAGNOSIS — Z7982 Long term (current) use of aspirin: Secondary | ICD-10-CM | POA: Diagnosis not present

## 2023-01-10 DIAGNOSIS — Z48812 Encounter for surgical aftercare following surgery on the circulatory system: Secondary | ICD-10-CM | POA: Diagnosis not present

## 2023-01-10 NOTE — Progress Notes (Unsigned)
[address]       [place] [address]             [phone]           [name] [place] Medical Record #[id number] Date of Birth: [birth year]  Farris Has, MD Farris Has, MD  Chief Complaint: follow up mv repair and maze    History of Present Illness:     Pt is a month out from above and is doing well. He has increased his activities and has noticed just occasional sternal discomfort. He denies SOB or lower ext edema. He has not heard from cardiac rehab. He has seen cardiology and has stopped the amiodarone. Still on eliquis     Past Medical History:  Diagnosis Date   Cancer (HCC)    Skin Cancer - Scalp, Right neck, Left Hand   Dysrhythmia    A. Fib while hospitalized June 2024   Heart murmur    History of nonmelanoma skin cancer    Hypercholesteremia    Hypertension    Pneumonia 08/2022   Trigeminal neuralgia     Past Surgical History:  Procedure Laterality Date   CATARACT EXTRACTION W/ INTRAOCULAR LENS IMPLANT Bilateral    CLIPPING OF ATRIAL APPENDAGE Left 12/08/2022   Procedure: CLIPPING OF ATRIAL APPENDAGE (LAA) USING 50 MM ATRICLIP;  Surgeon: Eugenio Hoes, MD;  Location: [facility];  Service: Open Heart Surgery;  Laterality: Left;   INGUINAL HERNIA REPAIR  11/07/2011   Procedure: LAPAROSCOPIC INGUINAL HERNIA;  Surgeon: Atilano Ina, MD,FACS;  Location: [facility];  Service: General;  Laterality: Left;   MAZE N/A 12/08/2022   Procedure: MAZE;  Surgeon: Eugenio Hoes, MD;  Location: [facility] OR;  Service: Open Heart Surgery;  Laterality: N/A;   MITRAL VALVE REPAIR N/A 12/08/2022   Procedure: MITRAL VALVE REPAIR (MVR) USING 34 MM SIMULUS SEMI- RIGID ANNULOPLASTY BAND;  Surgeon: Eugenio Hoes, MD;  Location: [facility] OR;  Service: Open Heart Surgery;  Laterality: N/A;   RETINAL DETACHMENT SURGERY  03/07/1995   RIGHT/LEFT HEART CATH AND CORONARY ANGIOGRAPHY N/A 10/12/2022   Procedure: RIGHT/LEFT HEART CATH AND CORONARY ANGIOGRAPHY;  Surgeon: [name], MD;  [facility];  Service: Cardiovascular;  Laterality: N/A;   SKIN CANCER EXCISION  2012, 1999   basal cell - neck and hand   TEE WITHOUT CARDIOVERSION N/A 10/16/2022   Procedure: TRANSESOPHAGEAL ECHOCARDIOGRAM;  Surgeon: Christell Constant, MD;  [facility]: [facility];  Service: Cardiovascular;  Laterality: N/A;   TEE WITHOUT CARDIOVERSION N/A 12/08/2022   Procedure: TRANSESOPHAGEAL ECHOCARDIOGRAM;  Surgeon: Eugenio Hoes, MD;  Location: [facility] OR;  Service: Open Heart Surgery;  Laterality: N/A;    Social History   Tobacco Use  Smoking Status Never  Smokeless Tobacco Never    Social History   Substance and Sexual Activity  Alcohol Use Not Currently   Comment: 3 glasses of wine a day    Social History   Socioeconomic History   Marital status: Married    Spouse name: Not on file   Number of children: Not on file   Years of education: Not on file   Highest education level: Not on file  Occupational History   Not on file  Tobacco Use   Smoking status: Never   Smokeless tobacco: Never  Vaping Use   Vaping status: Never Used  Substance and Sexual Activity   Alcohol use: Not Currently    Comment: 3 glasses  of wine a day   Drug use: No   Sexual activity: Yes  Other Topics Concern   Not on file  Social History Narrative   Not on file   Social Determinants of Health   Financial Resource Strain: Not on file  Food Insecurity: No Food Insecurity (08/12/2022)   Hunger Vital Sign    Worried About Running Out of Food in the Last Year: Never true    Ran Out of Food in the Last Year: Never true  Transportation Needs: No Transportation Needs (08/12/2022)   PRAPARE - Administrator, Civil Service (Medical): No    Lack of Transportation (Non-Medical): No  Physical Activity: Not on file  Stress: Not on file  Social Connections: Not on file  Intimate Partner Violence: Not At Risk (08/12/2022)   Humiliation, Afraid, Rape, and Kick  questionnaire    Fear of Current or Ex-Partner: No    Emotionally Abused: No    Physically Abused: No    Sexually Abused: No    No Known Allergies  Current Outpatient Medications  Medication Sig Dispense Refill   acetaminophen (TYLENOL) 325 MG tablet Take 2 tablets (650 mg total) by mouth every 6 (six) hours. (Patient taking differently: Take 650 mg by mouth as needed.)     apixaban (ELIQUIS) 5 MG TABS tablet Take 1 tablet (5 mg total) by mouth 2 (two) times daily. 60 tablet    Ascorbic Acid (VITAMIN C PO) Take 750 mg by mouth daily.     aspirin EC 81 MG tablet Take 1 tablet (81 mg total) by mouth daily. Swallow whole. 30 tablet 12   Cholecalciferol (VITAMIN D) 50 MCG (2000 UT) tablet Take 2,000 Units by mouth daily.     ferrous gluconate (FERGON) 324 MG tablet Take 1 tablet (324 mg total) by mouth daily with breakfast. 30 tablet 0   folic acid (FOLVITE) 1 MG tablet Take 1 mg by mouth daily.     furosemide (LASIX) 40 MG tablet Take 1 tablet (40 mg total) by mouth daily as needed. For weight gain of 3-5 lbs in 24 hours 30 tablet 0   gabapentin (NEURONTIN) 300 MG capsule TAKE 2 CAPSULES BY MOUTH 4 TIMES DAILY (Patient taking differently: Take 300 mg by mouth 2 (two) times daily.) 480 capsule 5   hydrocortisone 2.5 % ointment Apply 1 Application topically 2 (two) times daily as needed (itching).     ketoconazole (NIZORAL) 2 % cream Apply 1 Application topically as needed for irritation.     Magnesium 400 MG TABS Take 400 mg by mouth daily.     melatonin 3 MG TABS tablet Take 3 mg by mouth at bedtime as needed (sleep).     metoprolol tartrate (LOPRESSOR) 50 MG tablet Take 1 tablet (50 mg total) by mouth 2 (two) times daily. 60 tablet 1   Multiple Vitamins-Minerals (PRESERVISION AREDS 2) CAPS Take 1 capsule by mouth 2 (two) times daily.     olmesartan (BENICAR) 20 MG tablet Take 20 mg by mouth daily.     Polyethyl Glycol-Propyl Glycol (SYSTANE OP) Place 1 drop into both eyes daily as needed  (dry eyes).     potassium chloride SA (KLOR-CON M) 20 MEQ tablet Take 1 tablet (20 mEq total) by mouth daily as needed. For one week then stop 30 tablet 0   pravastatin (PRAVACHOL) 10 MG tablet Take 10 mg by mouth at bedtime.     sildenafil (REVATIO) 20 MG tablet Take 40-60 mg by mouth  daily as needed (ED).     thiamine (VITAMIN B-1) 100 MG tablet Take 100 mg by mouth daily.     traZODone (DESYREL) 100 MG tablet Take 100-150 mg by mouth at bedtime.     No current facility-administered medications for this visit.     Family History  Problem Relation Age of Onset   Heart disease Father    Heart disease Mother        Physical Exam: Incision clean Sternum stable Card: RR with extra systole and no murmur Ext: No edema Lungs: clear     Diagnostic Studies & Laboratory data: I have personally reviewed the following studies and agree with the findings     Recent Radiology Findings:       Recent Lab Findings: Lab Results  Component Value Date   WBC 7.0 12/13/2022   HGB 8.5 (L) 12/13/2022   HCT 26.4 (L) 12/13/2022   PLT 240 12/13/2022   GLUCOSE 94 12/14/2022   CHOL 156 10/04/2022   TRIG 106 10/04/2022   HDL 54 10/04/2022   LDLCALC 83 10/04/2022   ALT 20 12/06/2022   AST 21 12/06/2022   NA 140 12/14/2022   K 3.7 12/14/2022   CL 106 12/14/2022   CREATININE 0.72 12/14/2022   BUN 9 12/14/2022   CO2 25 12/14/2022   INR 1.3 (H) 12/08/2022   HGBA1C 5.1 12/06/2022      Assessment / Plan:     SP mv repair and maze. We discussed continuing restriction on weight lifting to 10lbs for 2 more weeks but that he can drive. He will have the anticoagulation determined by cardiology. We will have cardiac rehab call him otherwise he will follow up as needed   I have spent 30 min in review of the records, viewing studies and in face to face with patient and in coordination of future care    Eugenio Hoes 01/10/2023 5:29 PM

## 2023-01-11 ENCOUNTER — Ambulatory Visit (INDEPENDENT_AMBULATORY_CARE_PROVIDER_SITE_OTHER): Payer: Self-pay | Admitting: Thoracic Surgery (Cardiothoracic Vascular Surgery)

## 2023-01-11 ENCOUNTER — Encounter: Payer: Self-pay | Admitting: Thoracic Surgery (Cardiothoracic Vascular Surgery)

## 2023-01-11 VITALS — BP 129/82 | HR 52 | Temp 98.8°F | Resp 20 | Ht 73.0 in | Wt 150.8 lb

## 2023-01-11 DIAGNOSIS — Z9889 Other specified postprocedural states: Secondary | ICD-10-CM

## 2023-01-11 NOTE — Patient Instructions (Signed)
Follow up prn

## 2023-01-13 ENCOUNTER — Ambulatory Visit (INDEPENDENT_AMBULATORY_CARE_PROVIDER_SITE_OTHER): Payer: Medicare Other

## 2023-01-13 ENCOUNTER — Encounter (HOSPITAL_COMMUNITY): Payer: Self-pay

## 2023-01-13 ENCOUNTER — Ambulatory Visit (HOSPITAL_COMMUNITY)
Admission: EM | Admit: 2023-01-13 | Discharge: 2023-01-13 | Disposition: A | Discharge status: Home / Self Care | Payer: Medicare Other | Attending: Internal Medicine | Admitting: Internal Medicine

## 2023-01-13 DIAGNOSIS — M79671 Pain in right foot: Secondary | ICD-10-CM

## 2023-01-13 DIAGNOSIS — S92351A Displaced fracture of fifth metatarsal bone, right foot, initial encounter for closed fracture: Secondary | ICD-10-CM | POA: Diagnosis not present

## 2023-01-13 DIAGNOSIS — M85871 Other specified disorders of bone density and structure, right ankle and foot: Secondary | ICD-10-CM | POA: Diagnosis not present

## 2023-01-13 MED ORDER — HYDROCODONE-ACETAMINOPHEN 5-325 MG PO TABS: 1.0000 | ORAL_TABLET | ORAL | 0 refills | Status: DC | PRN

## 2023-01-13 NOTE — ED Provider Notes (Signed)
[facility]    CSN: [id number] Arrival date & time: 01/13/23  1142      History   Chief Complaint Chief Complaint  Patient presents with   Foot Injury    HPI James Ibarra is a 72 y.o. male.   72 year old male who presents to urgent care with complaints of right foot pain.  This started last night after he twisted his foot while carrying a large load of laundry.  He reports that he stepped on a unopened box of cat litter causing him to twist his foot to the side.  He immediately started having pain and swelling.  It has been difficult for him to put pressure on the foot since then.  He immediately started putting ice on the area and the swelling is better today but he continues to have bruising and difficulty putting pressure on the foot.  He did recently have mitral valve repair and did start on Eliquis.  He denies other injury.   Foot Injury Associated symptoms: no back pain and no fever     Past Medical History:  Diagnosis Date   Cancer (HCC)    Skin Cancer - Scalp, Right neck, Left Hand   Dysrhythmia    A. Fib while hospitalized June 2024   Heart murmur    History of nonmelanoma skin cancer    Hypercholesteremia    Hypertension    Pneumonia 08/2022   Trigeminal neuralgia     Patient Active Problem List   Diagnosis Date Noted   S/P MVR (mitral valve repair) 12/08/2022   Protein-calorie malnutrition, severe 08/19/2022   Alcohol withdrawal (HCC) 08/14/2022   Acute metabolic encephalopathy 08/14/2022   Paroxysmal atrial fibrillation with RVR (HCC) 08/14/2022   Nonrheumatic mitral valve regurgitation 08/14/2022   Mitral valve prolapse 08/14/2022   PNA (pneumonia) 08/12/2022   Severe sepsis (HCC) 08/12/2022   HLD (hyperlipidemia) 08/12/2022   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   Trigeminal neuralgia of left side of face 07/15/2019   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   Hypertension 10/31/2013   Pain in joint, shoulder region  04/14/2013   Nodule of right lung 11/07/2011    Past Surgical History:  Procedure Laterality Date   CATARACT EXTRACTION W/ INTRAOCULAR LENS IMPLANT Bilateral    CLIPPING OF ATRIAL APPENDAGE Left 12/08/2022   Procedure: CLIPPING OF ATRIAL APPENDAGE (LAA) USING 50 MM ATRICLIP;  Surgeon: Eugenio Hoes, MD;  Location: [facility] OR;  Service: Open Heart Surgery;  Laterality: Left;   INGUINAL HERNIA REPAIR  11/07/2011   Procedure: LAPAROSCOPIC INGUINAL HERNIA;  Surgeon: Atilano Ina, MD,FACS;  Location: [facility];  Service: General;  Laterality: Left;   MAZE N/A 12/08/2022   Procedure: MAZE;  Surgeon: Eugenio Hoes, MD;  Location: [facility] OR;  Service: Open Heart Surgery;  Laterality: N/A;   MITRAL VALVE REPAIR N/A 12/08/2022   Procedure: MITRAL VALVE REPAIR (MVR) USING 34 MM SIMULUS SEMI- RIGID ANNULOPLASTY BAND;  Surgeon: Eugenio Hoes, MD;  Location: [facility] OR;  Service: Open Heart Surgery;  Laterality: N/A;   RETINAL DETACHMENT SURGERY  03/07/1995   RIGHT/LEFT HEART CATH AND CORONARY ANGIOGRAPHY N/A 10/12/2022   Procedure: RIGHT/LEFT HEART CATH AND CORONARY ANGIOGRAPHY;  Surgeon: [name], MD;  [facility]: [facility];  Service: Cardiovascular;  Laterality: N/A;   SKIN CANCER EXCISION  2012, 1999   basal cell - neck and hand   TEE WITHOUT CARDIOVERSION N/A 10/16/2022   Procedure: TRANSESOPHAGEAL ECHOCARDIOGRAM;  Surgeon: [name]  A, MD;  [facility];  Service: Cardiovascular;  Laterality: N/A;   TEE WITHOUT CARDIOVERSION N/A 12/08/2022   Procedure: TRANSESOPHAGEAL ECHOCARDIOGRAM;  Surgeon: Eugenio Hoes, MD;  Location: [facility] OR;  Service: Open Heart Surgery;  Laterality: N/A;       Home Medications    Prior to Admission medications   Medication Sig Start Date End Date Taking? Authorizing Provider  acetaminophen (TYLENOL) 325 MG tablet Take 2 tablets (650 mg total) by mouth every 6 (six) hours. Patient taking differently: Take 650 mg by mouth as needed. 12/14/22  Yes  Roddenberry, Cecille Amsterdam, PA-C  apixaban (ELIQUIS) 5 MG TABS tablet Take 1 tablet (5 mg total) by mouth 2 (two) times daily. 08/25/22  Yes Ghimire, Werner Lean, MD  Ascorbic Acid (VITAMIN C PO) Take 750 mg by mouth daily.   Yes [provider]  aspirin EC 81 MG tablet Take 1 tablet (81 mg total) by mouth daily. Swallow whole. 12/14/22  Yes Roddenberry, Cecille Amsterdam, PA-C  Cholecalciferol (VITAMIN D) 50 MCG (2000 UT) tablet Take 2,000 Units by mouth daily.   Yes [provider]  ferrous gluconate (FERGON) 324 MG tablet Take 1 tablet (324 mg total) by mouth daily with breakfast. 12/14/22  Yes Roddenberry, Myron G, PA-C  folic acid (FOLVITE) 1 MG tablet Take 1 mg by mouth daily.   Yes [provider]  furosemide (LASIX) 40 MG tablet Take 1 tablet (40 mg total) by mouth daily as needed. For weight gain of 3-5 lbs in 24 hours 12/26/22  Yes Barrett, Erin R, PA-C  gabapentin (NEURONTIN) 300 MG capsule TAKE 2 CAPSULES BY MOUTH 4 TIMES DAILY Patient taking differently: Take 300 mg by mouth 2 (two) times daily. 10/10/22  Yes Levert Feinstein, MD  hydrocortisone 2.5 % ointment Apply 1 Application topically 2 (two) times daily as needed (itching).   Yes [provider]  ketoconazole (NIZORAL) 2 % cream Apply 1 Application topically as needed for irritation. 01/16/13  Yes [provider]  Magnesium 400 MG TABS Take 400 mg by mouth daily.   Yes [provider]  melatonin 3 MG TABS tablet Take 3 mg by mouth at bedtime as needed (sleep).   Yes [provider]  metoprolol tartrate (LOPRESSOR) 50 MG tablet Take 1 tablet (50 mg total) by mouth 2 (two) times daily. 12/19/22  Yes Doree Fudge M, PA-C  Multiple Vitamins-Minerals (PRESERVISION AREDS 2) CAPS Take 1 capsule by mouth 2 (two) times daily.   Yes [provider]  olmesartan (BENICAR) 20 MG tablet Take 20 mg by mouth daily.   Yes [provider]  Polyethyl Glycol-Propyl Glycol (SYSTANE OP) Place  1 drop into both eyes daily as needed (dry eyes).   Yes [provider]  potassium chloride SA (KLOR-CON M) 20 MEQ tablet Take 1 tablet (20 mEq total) by mouth daily as needed. For one week then stop 12/26/22  Yes Barrett, Erin R, PA-C  pravastatin (PRAVACHOL) 10 MG tablet Take 10 mg by mouth at bedtime.   Yes [provider]  sildenafil (REVATIO) 20 MG tablet Take 40-60 mg by mouth daily as needed (ED).   Yes [provider]  thiamine (VITAMIN B-1) 100 MG tablet Take 100 mg by mouth daily.   Yes [provider]  traZODone (DESYREL) 100 MG tablet Take 100-150 mg by mouth at bedtime.   Yes [provider]    Family History Family History  Problem Relation Age of Onset   Heart  disease Father    Heart disease Mother     Social History Social History   Tobacco Use   Smoking status: Never   Smokeless tobacco: Never  Vaping Use   Vaping status: Never Used  Substance Use Topics   Alcohol use: Not Currently    Comment: 3 glasses of wine a day   Drug use: No     Allergies   Patient has no known allergies.   Review of Systems Review of Systems  Constitutional:  Negative for chills and fever.  HENT:  Negative for ear pain and sore throat.   Eyes:  Negative for pain and visual disturbance.  Respiratory:  Negative for cough and shortness of breath.   Cardiovascular:  Negative for chest pain and palpitations.  Gastrointestinal:  Negative for abdominal pain and vomiting.  Genitourinary:  Negative for dysuria and hematuria.  Musculoskeletal:  Negative for arthralgias and back pain.       Right foot pain and swelling and bruising  Skin:  Negative for color change and rash.  Neurological:  Negative for seizures and syncope.  All other systems reviewed and are negative.    Physical Exam Triage Vital Signs ED Triage Vitals  Encounter Vitals Group     BP 01/13/23 1243 100/83     Systolic BP Percentile --      Diastolic BP Percentile --       Pulse Rate 01/13/23 1243 78     Resp 01/13/23 1243 17     Temp 01/13/23 1243 98.1 F (36.7 C)     Temp Source 01/13/23 1243 Oral     SpO2 01/13/23 1243 98 %     Weight 01/13/23 1242 140 lb (63.5 kg)     Height 01/13/23 1242 6\' 1"  (1.854 m)     Head Circumference --      Peak Flow --      Pain Score 01/13/23 1241 6     Pain Loc --      Pain Education --      Exclude from Growth Chart --    No data found.  Updated Vital Signs BP 100/83 (BP Location: Right Arm)   Pulse 78   Temp 98.1 F (36.7 C) (Oral)   Resp 17   Ht 6\' 1"  (1.854 m)   Wt 140 lb (63.5 kg)   SpO2 98%   BMI 18.47 kg/m   Visual Acuity Right Eye Distance:   Left Eye Distance:   Bilateral Distance:    Right Eye Near:   Left Eye Near:    Bilateral Near:     Physical Exam Vitals and nursing note reviewed.  Constitutional:      General: He is not in acute distress.    Appearance: He is well-developed.  HENT:     Head: Normocephalic and atraumatic.  Eyes:     Conjunctiva/sclera: Conjunctivae normal.  Cardiovascular:     Rate and Rhythm: Normal rate and regular rhythm.     Pulses:          Dorsalis pedis pulses are 2+ on the right side.       Posterior tibial pulses are 2+ on the right side.     Heart sounds: No murmur heard. Pulmonary:     Effort: Pulmonary effort is normal. No respiratory distress.     Breath sounds: Normal breath sounds.  Abdominal:     Palpations: Abdomen is soft.     Tenderness: There is no abdominal tenderness.  Musculoskeletal:  General: No swelling.     Cervical back: Neck supple.     Right foot: Normal range of motion. No deformity.       Feet:  Feet:     Right foot:     Skin integrity: Skin integrity normal.  Skin:    General: Skin is warm and dry.     Capillary Refill: Capillary refill takes less than 2 seconds.  Neurological:     Mental Status: He is alert.  Psychiatric:        Mood and Affect: Mood normal.      UC Treatments / Results   Labs (all labs ordered are listed, but only abnormal results are displayed) Labs Reviewed - No data to display  EKG   Radiology No results found.  Procedures Procedures (including critical care time)  Medications Ordered in UC Medications - No data to display  Initial Impression / Assessment and Plan / UC Course  I have reviewed the triage vital signs and the nursing notes.  Pertinent labs & imaging results that were available during my care of the patient were reviewed by me and considered in my medical decision making (see chart for details).     Right foot pain  Displaced fracture of fifth metatarsal bone, right foot, initial encounter for closed fracture   X-ray done today shows a right 5th metatarsal fracture. Recommend wearing a post-op boot and avoid pressure on the right foot until follow up with orthopedics. Use walker to ambulate. Call orthopedics on Monday to schedule an appointment for follow up on this. May use Norco for moderate to severe pain. May use tylenol for mild pain but do not take with the norco as this also contains tylenol.   Final Clinical Impressions(s) / UC Diagnoses   Final diagnoses:  None   Discharge Instructions   None    ED Prescriptions   None    PDMP not reviewed this encounter.   Landis Martins, [place] 01/13/23 1355

## 2023-01-13 NOTE — ED Triage Notes (Signed)
Pt states that he twisted his right foot and has some foot pain. X1 day

## 2023-01-13 NOTE — Discharge Instructions (Addendum)
X-ray done today shows a right 5th metatarsal fracture. Recommend wearing a post-op boot and avoid pressure on the right foot until follow up with orthopedics. Use walker to ambulate. Call orthopedics on Monday to schedule an appointment for follow up on this. May use Norco for moderate to severe pain. May use tylenol for mild pain but do not take with the norco as this also contains tylenol.

## 2023-01-14 DIAGNOSIS — I10 Essential (primary) hypertension: Secondary | ICD-10-CM | POA: Diagnosis not present

## 2023-01-14 DIAGNOSIS — Z7982 Long term (current) use of aspirin: Secondary | ICD-10-CM | POA: Diagnosis not present

## 2023-01-14 DIAGNOSIS — E7800 Pure hypercholesterolemia, unspecified: Secondary | ICD-10-CM | POA: Diagnosis not present

## 2023-01-14 DIAGNOSIS — Z48812 Encounter for surgical aftercare following surgery on the circulatory system: Secondary | ICD-10-CM | POA: Diagnosis not present

## 2023-01-14 DIAGNOSIS — Z85828 Personal history of other malignant neoplasm of skin: Secondary | ICD-10-CM | POA: Diagnosis not present

## 2023-01-14 DIAGNOSIS — I480 Paroxysmal atrial fibrillation: Secondary | ICD-10-CM | POA: Diagnosis not present

## 2023-01-14 DIAGNOSIS — Z7901 Long term (current) use of anticoagulants: Secondary | ICD-10-CM | POA: Diagnosis not present

## 2023-01-14 DIAGNOSIS — Z556 Problems related to health literacy: Secondary | ICD-10-CM | POA: Diagnosis not present

## 2023-01-15 ENCOUNTER — Telehealth (HOSPITAL_COMMUNITY): Payer: Self-pay

## 2023-01-15 NOTE — Telephone Encounter (Signed)
Called pt to Schedule cardiac orientation. He informed me that he tripped and broke a toe on Friday 11/8. He does have an appointment with Ortho on 11/13 @ 10am. He is hoping to get scheduled soon but due to having a broken toe he can not wear tennis shoes or walk. I did inform him he will have to be able to walk and wear shoes to participate in cardiac rehab.

## 2023-01-17 DIAGNOSIS — S92351A Displaced fracture of fifth metatarsal bone, right foot, initial encounter for closed fracture: Secondary | ICD-10-CM | POA: Diagnosis not present

## 2023-01-19 ENCOUNTER — Ambulatory Visit (HOSPITAL_COMMUNITY): Payer: Medicare Other | Attending: Cardiovascular Disease

## 2023-01-19 DIAGNOSIS — I361 Nonrheumatic tricuspid (valve) insufficiency: Secondary | ICD-10-CM | POA: Diagnosis not present

## 2023-01-19 DIAGNOSIS — Z48812 Encounter for surgical aftercare following surgery on the circulatory system: Secondary | ICD-10-CM | POA: Diagnosis not present

## 2023-01-19 DIAGNOSIS — Z9889 Other specified postprocedural states: Secondary | ICD-10-CM | POA: Insufficient documentation

## 2023-01-19 DIAGNOSIS — I340 Nonrheumatic mitral (valve) insufficiency: Secondary | ICD-10-CM | POA: Diagnosis present

## 2023-01-19 DIAGNOSIS — E785 Hyperlipidemia, unspecified: Secondary | ICD-10-CM | POA: Insufficient documentation

## 2023-01-19 DIAGNOSIS — I10 Essential (primary) hypertension: Secondary | ICD-10-CM | POA: Diagnosis not present

## 2023-01-19 LAB — ECHOCARDIOGRAM COMPLETE
Area-P 1/2: 2.74 cm2
MV VTI: 2.04 cm2
S' Lateral: 2.9 cm

## 2023-01-25 DIAGNOSIS — Z556 Problems related to health literacy: Secondary | ICD-10-CM | POA: Diagnosis not present

## 2023-01-25 DIAGNOSIS — I10 Essential (primary) hypertension: Secondary | ICD-10-CM | POA: Diagnosis not present

## 2023-01-25 DIAGNOSIS — I480 Paroxysmal atrial fibrillation: Secondary | ICD-10-CM | POA: Diagnosis not present

## 2023-01-25 DIAGNOSIS — Z7901 Long term (current) use of anticoagulants: Secondary | ICD-10-CM | POA: Diagnosis not present

## 2023-01-25 DIAGNOSIS — Z85828 Personal history of other malignant neoplasm of skin: Secondary | ICD-10-CM | POA: Diagnosis not present

## 2023-01-25 DIAGNOSIS — Z48812 Encounter for surgical aftercare following surgery on the circulatory system: Secondary | ICD-10-CM | POA: Diagnosis not present

## 2023-01-25 DIAGNOSIS — E7800 Pure hypercholesterolemia, unspecified: Secondary | ICD-10-CM | POA: Diagnosis not present

## 2023-01-25 DIAGNOSIS — Z7982 Long term (current) use of aspirin: Secondary | ICD-10-CM | POA: Diagnosis not present

## 2023-01-26 DIAGNOSIS — I480 Paroxysmal atrial fibrillation: Secondary | ICD-10-CM | POA: Diagnosis not present

## 2023-01-30 DIAGNOSIS — Z7982 Long term (current) use of aspirin: Secondary | ICD-10-CM | POA: Diagnosis not present

## 2023-01-30 DIAGNOSIS — Z7901 Long term (current) use of anticoagulants: Secondary | ICD-10-CM | POA: Diagnosis not present

## 2023-01-30 DIAGNOSIS — I10 Essential (primary) hypertension: Secondary | ICD-10-CM | POA: Diagnosis not present

## 2023-01-30 DIAGNOSIS — E7800 Pure hypercholesterolemia, unspecified: Secondary | ICD-10-CM | POA: Diagnosis not present

## 2023-01-30 DIAGNOSIS — Z556 Problems related to health literacy: Secondary | ICD-10-CM | POA: Diagnosis not present

## 2023-01-30 DIAGNOSIS — Z48812 Encounter for surgical aftercare following surgery on the circulatory system: Secondary | ICD-10-CM | POA: Diagnosis not present

## 2023-01-30 DIAGNOSIS — Z85828 Personal history of other malignant neoplasm of skin: Secondary | ICD-10-CM | POA: Diagnosis not present

## 2023-01-30 DIAGNOSIS — I480 Paroxysmal atrial fibrillation: Secondary | ICD-10-CM | POA: Diagnosis not present

## 2023-02-06 DIAGNOSIS — Z7982 Long term (current) use of aspirin: Secondary | ICD-10-CM | POA: Diagnosis not present

## 2023-02-06 DIAGNOSIS — Z48812 Encounter for surgical aftercare following surgery on the circulatory system: Secondary | ICD-10-CM | POA: Diagnosis not present

## 2023-02-06 DIAGNOSIS — Z7901 Long term (current) use of anticoagulants: Secondary | ICD-10-CM | POA: Diagnosis not present

## 2023-02-06 DIAGNOSIS — I10 Essential (primary) hypertension: Secondary | ICD-10-CM | POA: Diagnosis not present

## 2023-02-06 DIAGNOSIS — E7800 Pure hypercholesterolemia, unspecified: Secondary | ICD-10-CM | POA: Diagnosis not present

## 2023-02-06 DIAGNOSIS — Z556 Problems related to health literacy: Secondary | ICD-10-CM | POA: Diagnosis not present

## 2023-02-06 DIAGNOSIS — Z85828 Personal history of other malignant neoplasm of skin: Secondary | ICD-10-CM | POA: Diagnosis not present

## 2023-02-06 DIAGNOSIS — I480 Paroxysmal atrial fibrillation: Secondary | ICD-10-CM | POA: Diagnosis not present

## 2023-02-07 DIAGNOSIS — I480 Paroxysmal atrial fibrillation: Secondary | ICD-10-CM | POA: Diagnosis not present

## 2023-02-07 DIAGNOSIS — Z7982 Long term (current) use of aspirin: Secondary | ICD-10-CM | POA: Diagnosis not present

## 2023-02-07 DIAGNOSIS — E7800 Pure hypercholesterolemia, unspecified: Secondary | ICD-10-CM | POA: Diagnosis not present

## 2023-02-07 DIAGNOSIS — Z85828 Personal history of other malignant neoplasm of skin: Secondary | ICD-10-CM | POA: Diagnosis not present

## 2023-02-07 DIAGNOSIS — Z556 Problems related to health literacy: Secondary | ICD-10-CM | POA: Diagnosis not present

## 2023-02-07 DIAGNOSIS — Z48812 Encounter for surgical aftercare following surgery on the circulatory system: Secondary | ICD-10-CM | POA: Diagnosis not present

## 2023-02-07 DIAGNOSIS — I10 Essential (primary) hypertension: Secondary | ICD-10-CM | POA: Diagnosis not present

## 2023-02-07 DIAGNOSIS — Z7901 Long term (current) use of anticoagulants: Secondary | ICD-10-CM | POA: Diagnosis not present

## 2023-02-11 DIAGNOSIS — Z7901 Long term (current) use of anticoagulants: Secondary | ICD-10-CM | POA: Diagnosis not present

## 2023-02-11 DIAGNOSIS — I10 Essential (primary) hypertension: Secondary | ICD-10-CM | POA: Diagnosis not present

## 2023-02-11 DIAGNOSIS — I480 Paroxysmal atrial fibrillation: Secondary | ICD-10-CM | POA: Diagnosis not present

## 2023-02-11 DIAGNOSIS — Z85828 Personal history of other malignant neoplasm of skin: Secondary | ICD-10-CM | POA: Diagnosis not present

## 2023-02-11 DIAGNOSIS — Z48812 Encounter for surgical aftercare following surgery on the circulatory system: Secondary | ICD-10-CM | POA: Diagnosis not present

## 2023-02-11 DIAGNOSIS — Z7982 Long term (current) use of aspirin: Secondary | ICD-10-CM | POA: Diagnosis not present

## 2023-02-11 DIAGNOSIS — E7800 Pure hypercholesterolemia, unspecified: Secondary | ICD-10-CM | POA: Diagnosis not present

## 2023-02-11 DIAGNOSIS — Z556 Problems related to health literacy: Secondary | ICD-10-CM | POA: Diagnosis not present

## 2023-02-12 ENCOUNTER — Telehealth: Payer: Self-pay

## 2023-02-12 DIAGNOSIS — I480 Paroxysmal atrial fibrillation: Secondary | ICD-10-CM

## 2023-02-12 NOTE — Telephone Encounter (Signed)
Spoke with patient concerning recent monitor results, referral to EP ordered. No further needs at this time    James Gold, PA-C 01/29/2023  8:06 AM EST     Patient's heart monitor showed no patient triggered events. Atrial fibrillation occurred 12% of the time at an average heart rate of 112bpm. No other significant arrhythmias. I spoke with Dr. Lynnette Caffey about the results as well. Given relatively high burden of recurrent afib, we both recommend referral to EP. There can still be a remnant of the LAA left that can be a hiding spot for thrombus. Please continue Eliquis for now. We will place referral to EP.

## 2023-02-14 DIAGNOSIS — S92351D Displaced fracture of fifth metatarsal bone, right foot, subsequent encounter for fracture with routine healing: Secondary | ICD-10-CM | POA: Diagnosis not present

## 2023-02-19 ENCOUNTER — Telehealth: Payer: Self-pay | Admitting: Internal Medicine

## 2023-02-19 NOTE — Telephone Encounter (Signed)
Pt c/o medication issue:  1. Name of Medication:   2. How are you currently taking this medication (dosage and times per day)?   3. Are you having a reaction (difficulty breathing--STAT)?   4. What is your medication issue? Patient wants to know if he needs an antibiotic before he gets his teeth cleaned tomorrow? If he does, please call in to [employer], [place]

## 2023-02-19 NOTE — Telephone Encounter (Signed)
Called patient and adv of recommendations.  He will cancel dental cleaning for now.  Sees cardiac APP on 12/30.  Adv pt antibiotics can be sent in for him at that time.

## 2023-02-19 NOTE — Telephone Encounter (Signed)
Discussed with [facility]. Unless urgent, it is advised to wait 12 weeks post surgery for dental work (even routine cleanings). Dental abx will need to be ordered by cardiology.

## 2023-03-04 NOTE — Progress Notes (Unsigned)
Cardiology Office Note    Patient Name: James Ibarra Date of Encounter: 03/04/2023  Primary Care Provider:  Farris Has, MD Primary Cardiologist:  [name], MD Primary Electrophysiologist: None   Past Medical History    Past Medical History:  Diagnosis Date   Cancer [facility] [place])    Skin Cancer - Scalp, Right neck, Left Hand   Dysrhythmia    A. Fib while hospitalized June 2024   Heart murmur    History of nonmelanoma skin cancer    Hypercholesteremia    Hypertension    Pneumonia 08/2022   Trigeminal neuralgia     History of Present Illness  James Ibarra is a 71 y.o. 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  James Ibarra was seen by his PCP in spring and underwent TTE that showed severe MR with preserved LV function.  He was referred to cardiology but developed acute shortness of breath and was admitted and found to have pneumonia with AF with RVR that was treated medically with improvement.  He was seen by Dr. Dr. Lynnette Caffey in July 2024 and underwent TTE that confirmed normal LV function with eczematous bileaflet valve with prolapse.  He underwent R/LHC that showed no CAD and also wore Holter monitor that showed no episodes of AF.  He was referred to thoracic surgery and was seen by Dr. Leafy Ro and underwent successful AVR repair with maze and left atrial appendage clipping on 12/08/2022.  He developed postop AF and was treated with amiodarone which was transitioned to p.o. and patient developed right pleural effusion and was given Lasix 40 mg.  He was seen by CT surgery on 10/22 had no shortness of breath with stable weight.  He was seen by Perlie Gold, PA on 01/03/2023 and was noted to be feeling generally well but noted localized pain on the right side of his chest near incision due to overexertion.  He denied any palpitations since  his surgery and had been walking 3 times a day for exercise.  He noted compliance with metoprolol which was reduced from 100 to 50 mg twice daily.  Amiodarone was discontinued and 14-day event monitor was ordered to evaluate for AF post maze and clipping that showed 12% burden but no significant arrhythmia.  Case was discussed with Dr. Dr. Lynnette Caffey who recommended referral to EP and continuation of Eliquis.  He is scheduled to see Dr. Nelly Laurence on January 24th. He was last seen by Dr. Leafy Ro on 01/11/2023 with no new complaint and advisement to lift no more than 10 lbs for two more weeks.  During today's visit the patient reports that he is feeling much better and is recovering well with no reported palpitations or skipped heartbeats since the last visit. In addition to the cardiac issues, the patient recently broke his foot, which has limited his physical activity. He was walking three times a day for exercise prior to the accident. He is currently wearing a cast and is expected to receive a carbon fiber shoe in the following week. He reports no pain while walking but is cautious about twisting the foot.  The patient was also taking Aspirin and Amiodarone, but these have been discontinued. The patient reports no problems with the blood thinner Eliquis, but did have a minor incident of nicking himself due to the blood-thinning effect. The patient has not needed  to use Lasix as there have been no issues with swelling. Patient denies chest pain, palpitations, dyspnea, PND, orthopnea, nausea, vomiting, dizziness, syncope, edema, weight gain, or early satiety.   Review of Systems  Please see the history of present illness.    All other systems reviewed and are otherwise negative except as noted above.  Physical Exam    Wt Readings from Last 3 Encounters:  01/13/23 140 lb (63.5 kg)  01/11/23 150 lb 12.8 oz (68.4 kg)  01/03/23 154 lb (69.9 kg)   ZO:XWRUE were no vitals filed for this visit.,There is no height  or weight on file to calculate BMI. GEN: Well nourished, well developed in no acute distress Neck: No JVD; No carotid bruits Pulmonary: Clear to auscultation without rales, wheezing or rhonchi  Cardiovascular: Normal rate. Regular rhythm. Normal S1. Normal S2.   Murmurs: There is no murmur.  ABDOMEN: Soft, non-tender, non-distended EXTREMITIES:  No edema; No deformity   EKG/LABS/ Recent Cardiac Studies   ECG personally reviewed by me today -none completed today  Risk Assessment/Calculations:    CHA2DS2-VASc Score = 2   This indicates a 2.2% annual risk of stroke. The patient's score is based upon: CHF History: 0 HTN History: 1 Diabetes History: 0 Stroke History: 0 Vascular Disease History: 0 Age Score: 1 Gender Score: 0         Lab Results  Component Value Date   WBC 7.0 12/13/2022   HGB 8.5 (L) 12/13/2022   HCT 26.4 (L) 12/13/2022   MCV 90.4 12/13/2022   PLT 240 12/13/2022   Lab Results  Component Value Date   CREATININE 0.72 12/14/2022   BUN 9 12/14/2022   NA 140 12/14/2022   K 3.7 12/14/2022   CL 106 12/14/2022   CO2 25 12/14/2022   Lab Results  Component Value Date   CHOL 156 10/04/2022   HDL 54 10/04/2022   LDLCALC 83 10/04/2022   TRIG 106 10/04/2022   CHOLHDL 2.9 10/04/2022    Lab Results  Component Value Date   HGBA1C 5.1 12/06/2022   Assessment & Plan    1.  Severe nonrheumatic MVR: -s/p surgical repair with maze and LAA clip with recurrence of AF postprocedure and 14-day event monitor showing 12% burden with referral to EP in January -SBE prophylaxis discussed -Patient is ready to start cardiac rehab once foot heals and will contact them this week to advise. -Patient is euvolemic on examination  2.  Paroxysmal AF: -Patient with 14% burden per 14-day event monitor and referral was placed to EP for further treatment -Patient's last hemoglobin was 10.1 and creatinine was 0.72 -Patient was taking ASA 81 mg and was advised to discontinue today  due to Eliquis -Continue Eliquis 5 mg twice daily -Continue metoprolol 50 mg twice daily  3.  Essential hypertension: -Patient's blood pressure today was 116/82 -Continue Benicar 20 mg daily and metoprolol 50 mg twice daily  4.  Hyperlipidemia: -Patient's last was 83 -Continue pravastatin 10 mg daily  Disposition: Follow-up with [name], MD or APP in 6 months   Signed, Napoleon Form, Leodis Rains, NP 03/04/2023, 12:06 PM [facility]

## 2023-03-05 ENCOUNTER — Ambulatory Visit: Payer: Medicare Other | Attending: Nurse Practitioner | Admitting: Nurse Practitioner

## 2023-03-05 ENCOUNTER — Encounter: Payer: Self-pay | Admitting: Nurse Practitioner

## 2023-03-05 VITALS — BP 116/82 | HR 67 | Ht 73.0 in | Wt 155.0 lb

## 2023-03-05 DIAGNOSIS — Z9889 Other specified postprocedural states: Secondary | ICD-10-CM

## 2023-03-05 DIAGNOSIS — I10 Essential (primary) hypertension: Secondary | ICD-10-CM

## 2023-03-05 DIAGNOSIS — I480 Paroxysmal atrial fibrillation: Secondary | ICD-10-CM | POA: Diagnosis not present

## 2023-03-05 DIAGNOSIS — I340 Nonrheumatic mitral (valve) insufficiency: Secondary | ICD-10-CM

## 2023-03-05 DIAGNOSIS — E7849 Other hyperlipidemia: Secondary | ICD-10-CM

## 2023-03-05 MED ORDER — ASPIRIN 81 MG PO TBEC: 81.0000 mg | DELAYED_RELEASE_TABLET | Freq: Every day | ORAL | Status: DC

## 2023-03-05 NOTE — Patient Instructions (Addendum)
Medication Instructions:   Your physician recommends that you continue on your current medications as directed. Please refer to the Current Medication list given to you today.   *If you need a refill on your cardiac medications before your next appointment, please call your pharmacy*   Lab Work:  None ordered.  If you have labs (blood work) drawn today and your tests are completely normal, you will receive your results only by: MyChart Message (if you have MyChart) OR A paper copy in the mail If you have any lab test that is abnormal or we need to change your treatment, we will call you to review the results.   Testing/Procedures:  None ordered.   Follow-Up: At [facility], you and your health needs are our priority.  As part of our continuing mission to provide you with exceptional heart care, we have created designated Provider Care Teams.  These Care Teams include your primary Cardiologist (physician) and Advanced Practice Providers (APPs -  Physician Assistants and Nurse Practitioners) who all work together to provide you with the care you need, when you need it.  We recommend signing up for the patient portal called "MyChart".  Sign up information is provided on this After Visit Summary.  MyChart is used to connect with patients for Virtual Visits (Telemedicine).  Patients are able to view lab/test results, encounter notes, upcoming appointments, etc.  Non-urgent messages can be sent to your provider as well.   To learn more about what you can do with MyChart, go to [email or web address].    Your next appointment:   6 month(s)  Provider:   [name], MD     Other Instructions  Adopting a Healthy Lifestyle.   Weight: Know what a healthy weight is for you (roughly BMI <25) and aim to maintain this. You can calculate your body mass index on your smart phone. Unfortunately, this is not the most accurate measure of healthy weight, but it is the simplest  measurement to use. A more accurate measurement involves body scanning which measures lean muscle, fat tissue and bony density. We do not have this equipment at [facility].    Diet: Aim for 7+ servings of fruits and vegetables daily Limit animal fats in diet for cholesterol and heart health - choose grass fed whenever available Avoid highly processed foods (fast food burgers, tacos, fried chicken, pizza, hot dogs, french fries)  Saturated fat comes in the form of butter, lard, coconut oil, margarine, partially hydrogenated oils, and fat in meat. These increase your risk of cardiovascular disease.  Use healthy plant oils, such as olive, canola, soy, corn, sunflower and peanut.  Whole foods such as fruits, vegetables and whole grains have fiber  Men need > 38 grams of fiber per day Women need > 25 grams of fiber per day  Load up on vegetables and fruits - one-half of your plate: Aim for color and variety, and remember that potatoes dont count. Go for whole grains - one-quarter of your plate: Whole wheat, barley, wheat berries, quinoa, oats, brown rice, and foods made with them. If you want pasta, go with whole wheat pasta. Protein power - one-quarter of your plate: Fish, chicken, beans, and nuts are all healthy, versatile protein sources. Limit red meat. You need carbohydrates for energy! The type of carbohydrate is more important than the amount. Choose carbohydrates such as vegetables, fruits, whole grains, beans, and nuts in the place of white rice, white pasta, potatoes (baked or fried), macaroni and cheese,  cakes, cookies, and donuts.  If youre thirsty, drink water. Coffee and tea are good in moderation, but skip sugary drinks and limit milk and dairy products to one or two daily servings. Keep sugar intake at 6 teaspoons or 24 grams or LESS       Exercise: Aim for 150 min of moderate intensity exercise weekly for heart health, and weights twice weekly for bone health Stay active - any steps  are better than no steps! Aim for 7-9 hours of sleep daily      DASH Eating Plan DASH stands for Dietary Approaches to Stop Hypertension. The DASH eating plan is a healthy eating plan that has been shown to: Lower high blood pressure (hypertension). Reduce your risk for type 2 diabetes, heart disease, and stroke. Help with weight loss. What are tips for following this plan? Reading food labels Check food labels for the amount of salt (sodium) per serving. Choose foods with less than 5 percent of the Daily Value (DV) of sodium. In general, foods with less than 300 milligrams (mg) of sodium per serving fit into this eating plan. To find whole grains, look for the word "whole" as the first word in the ingredient list. Shopping Buy products labeled as "low-sodium" or "no salt added." Buy fresh foods. Avoid canned foods and pre-made or frozen meals. Cooking Try not to add salt when you cook. Use salt-free seasonings or herbs instead of table salt or sea salt. Check with your health care provider or pharmacist before using salt substitutes. Do not fry foods. Cook foods in healthy ways, such as baking, boiling, grilling, roasting, or broiling. Cook using oils that are good for your heart. These include olive, canola, avocado, soybean, and sunflower oil. Meal planning  Eat a balanced diet. This should include: 4 or more servings of fruits and 4 or more servings of vegetables each day. Try to fill half of your plate with fruits and vegetables. 6-8 servings of whole grains each day. 6 or less servings of lean meat, poultry, or fish each day. 1 oz is 1 serving. A 3 oz (85 g) serving of meat is about the same size as the palm of your hand. One egg is 1 oz (28 g). 2-3 servings of low-fat dairy each day. One serving is 1 cup (237 mL). 1 serving of nuts, seeds, or beans 5 times each week. 2-3 servings of heart-healthy fats. Healthy fats called omega-3 fatty acids are found in foods such as walnuts,  flaxseeds, fortified milks, and eggs. These fats are also found in cold-water fish, such as sardines, salmon, and mackerel. Limit how much you eat of: Canned or prepackaged foods. Food that is high in trans fat, such as fried foods. Food that is high in saturated fat, such as fatty meat. Desserts and other sweets, sugary drinks, and other foods with added sugar. Full-fat dairy products. Do not salt foods before eating. Do not eat more than 4 egg yolks a week. Try to eat at least 2 vegetarian meals a week. Eat more home-cooked food and less restaurant, buffet, and fast food. Lifestyle When eating at a restaurant, ask if your food can be made with less salt or no salt. If you drink alcohol: Limit how much you have to: 0-1 drink a day if you are male. 0-2 drinks a day if you are male. Know how much alcohol is in your drink. In the [place] one drink is one 12 oz bottle of beer (355 mL), one 5 oz  glass of wine (148 mL), or one 1 oz glass of hard liquor (44 mL). General information Avoid eating more than 2,300 mg of salt a day. If you have hypertension, you may need to reduce your sodium intake to 1,500 mg a day. Work with your provider to stay at a healthy body weight or lose weight. Ask what the best weight range is for you. On most days of the week, get at least 30 minutes of exercise that causes your heart to beat faster. This may include walking, swimming, or biking. Work with your provider or dietitian to adjust your eating plan to meet your specific calorie needs. What foods should I eat? Fruits All fresh, dried, or frozen fruit. Canned fruits that are in their natural juice and do not have sugar added to them. Vegetables Fresh or frozen vegetables that are raw, steamed, roasted, or grilled. Low-sodium or reduced-sodium tomato and vegetable juice. Low-sodium or reduced-sodium tomato sauce and tomato paste. Low-sodium or reduced-sodium canned vegetables. Grains Whole-grain or  whole-wheat bread. Whole-grain or whole-wheat pasta. Brown rice. Orpah Cobb. Bulgur. Whole-grain and low-sodium cereals. Pita bread. Low-fat, low-sodium crackers. Whole-wheat flour tortillas. Meats and other proteins Skinless chicken or [place]. Ground chicken or [place]. Pork with fat trimmed off. Fish and seafood. Egg whites. Dried beans, peas, or lentils. Unsalted nuts, nut butters, and seeds. Unsalted canned beans. Lean cuts of beef with fat trimmed off. Low-sodium, lean precooked or cured meat, such as sausages or meat loaves. Dairy Low-fat (1%) or fat-free (skim) milk. Reduced-fat, low-fat, or fat-free cheeses. Nonfat, low-sodium ricotta or cottage cheese. Low-fat or nonfat yogurt. Low-fat, low-sodium cheese. Fats and oils Soft margarine without trans fats. Vegetable oil. Reduced-fat, low-fat, or light mayonnaise and salad dressings (reduced-sodium). Canola, safflower, olive, avocado, soybean, and sunflower oils. Avocado. Seasonings and condiments Herbs. Spices. Seasoning mixes without salt. Other foods Unsalted popcorn and pretzels. Fat-free sweets. The items listed above may not be all the foods and drinks you can have. Talk to a dietitian to learn more. What foods should I avoid? Fruits Canned fruit in a light or heavy syrup. Fried fruit. Fruit in cream or butter sauce. Vegetables Creamed or fried vegetables. Vegetables in a cheese sauce. Regular canned vegetables that are not marked as low-sodium or reduced-sodium. Regular canned tomato sauce and paste that are not marked as low-sodium or reduced-sodium. Regular tomato and vegetable juices that are not marked as low-sodium or reduced-sodium. Rosita Fire. Olives. Grains Baked goods made with fat, such as croissants, muffins, or some breads. Dry pasta or rice meal packs. Meats and other proteins Fatty cuts of meat. Ribs. Fried meat. Tomasa Blase. Bologna, salami, and other precooked or cured meats, such as sausages or meat loaves, that are not  lean and low in sodium. Fat from the back of a pig (fatback). Bratwurst. Salted nuts and seeds. Canned beans with added salt. Canned or smoked fish. Whole eggs or egg yolks. Chicken or [place] with skin. Dairy Whole or 2% milk, cream, and half-and-half. Whole or full-fat cream cheese. Whole-fat or sweetened yogurt. Full-fat cheese. Nondairy creamers. Whipped toppings. Processed cheese and cheese spreads. Fats and oils Butter. Stick margarine. Lard. Shortening. Ghee. Bacon fat. Tropical oils, such as coconut, palm kernel, or palm oil. Seasonings and condiments Onion salt, garlic salt, seasoned salt, table salt, and sea salt. Worcestershire sauce. Tartar sauce. Barbecue sauce. Teriyaki sauce. Soy sauce, including reduced-sodium soy sauce. Steak sauce. Canned and packaged gravies. Fish sauce. Oyster sauce. Cocktail sauce. Store-bought horseradish. Ketchup. Mustard. Meat flavorings and  tenderizers. Bouillon cubes. Hot sauces. Pre-made or packaged marinades. Pre-made or packaged taco seasonings. Relishes. Regular salad dressings. Other foods Salted popcorn and pretzels. The items listed above may not be all the foods and drinks you should avoid. Talk to a dietitian to learn more. Where to find more information National Heart, Lung, and Blood Institute (NHLBI): BuffaloDryCleaner.gl American Heart Association (AHA): heart.org Academy of Nutrition and Dietetics: eatright.org National Kidney Foundation (NKF): kidney.org This information is not intended to replace advice given to you by your health care provider. Make sure you discuss any questions you have with your health care provider. Document Revised: 03/09/2022 Document Reviewed: 03/09/2022 Elsevier Patient Education  2024 [employer].

## 2023-03-12 DIAGNOSIS — S92351A Displaced fracture of fifth metatarsal bone, right foot, initial encounter for closed fracture: Secondary | ICD-10-CM | POA: Diagnosis not present

## 2023-03-14 DIAGNOSIS — S92351D Displaced fracture of fifth metatarsal bone, right foot, subsequent encounter for fracture with routine healing: Secondary | ICD-10-CM | POA: Diagnosis not present

## 2023-03-30 ENCOUNTER — Encounter: Payer: Self-pay | Admitting: Cardiovascular Disease

## 2023-03-30 ENCOUNTER — Ambulatory Visit: Payer: Medicare Other | Attending: Cardiovascular Disease | Admitting: Cardiovascular Disease

## 2023-03-30 VITALS — BP 120/86 | HR 61 | Ht 73.0 in | Wt 159.4 lb

## 2023-03-30 DIAGNOSIS — I480 Paroxysmal atrial fibrillation: Secondary | ICD-10-CM | POA: Diagnosis not present

## 2023-03-30 NOTE — Patient Instructions (Signed)
Medication Instructions:  Your physician recommends that you continue on your current medications as directed. Please refer to the Current Medication list given to you today. *If you need a refill on your cardiac medications before your next appointment, please call your pharmacy*   Lab Work: CBC and BMET on Thursday, Feb 20 - this can be completed at ANY [facility] - no appointment required and this does not have to be fasting If you have labs (blood work) drawn today and your tests are completely normal, you will receive your results only by: MyChart Message (if you have MyChart) OR A paper copy in the mail If you have any lab test that is abnormal or we need to change your treatment, we will call you to review the results.   Testing/Procedures: Cardiac CT - someone from the hospital will call you to schedule this  Your physician has requested that you have cardiac CT. Cardiac computed tomography (CT) is a painless test that uses an x-ray machine to take clear, detailed pictures of your heart. For further information please visit [email or web address] Please follow instruction sheet as given.   Atrial Fibrillation Ablation  Your physician has recommended that you have an ablation. Catheter ablation is a medical procedure used to treat some cardiac arrhythmias (irregular heartbeats). During catheter ablation, a long, thin, flexible tube is put into a blood vessel in your groin (upper thigh), or neck. This tube is called an ablation catheter. It is then guided to your heart through the blood vessel. Radio frequency waves destroy small areas of heart tissue where abnormal heartbeats may cause an arrhythmia to start. Please see the instruction sheet given to you today.  You are scheduled for Atrial Fibrillation Ablation on Thursday, March 20 with Dr. Halford Chessman.Please arrive at the Main Entrance A at [facility]: [address] [place], [place] [address] at 5:30 AM     Follow-Up: At [facility], you and your health needs are our priority.  As part of our continuing mission to provide you with exceptional heart care, we have created designated Provider Care Teams.  These Care Teams include your primary Cardiologist (physician) and Advanced Practice Providers (APPs -  Physician Assistants and Nurse Practitioners) who all work together to provide you with the care you need, when you need it.  We recommend signing up for the patient portal called "MyChart".  Sign up information is provided on this After Visit Summary.  MyChart is used to connect with patients for Virtual Visits (Telemedicine).  Patients are able to view lab/test results, encounter notes, upcoming appointments, etc.  Non-urgent messages can be sent to your provider as well.   To learn more about what you can do with MyChart, go to [email or web address].    Your next appointment:   We will schedule follow up after your ablation   Provider:   York Pellant, MD

## 2023-03-30 NOTE — Progress Notes (Signed)
Electrophysiology Office Note:    Date:  03/30/2023   ID:  James Ibarra, DOB 1950/08/14, MRN [id number]  PCP:  Farris Has, MD   [facility] Providers Cardiologist:  [name], MD Electrophysiologist:  Maurice Small, MD     Referring MD: Perlie Gold, PA-C   History of Present Illness:    James Ibarra is a 73 y.o. 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     He underwent mitral valve surgery with maze and left atrial appendage clipping on December 08, 2022.  He was treated with amiodarone for postoperative atrial fibrillation.    I discussed the use of AI scribe software for clinical note transcription with the patient, who gave verbal consent to proceed.  The patient, with a history of mitral valve surgery and atrial fibrillation, presents for evaluation of recurrent atrial fibrillation. He reports no sensation of abnormal heartbeats. Prior to his mitral valve surgery, atrial fibrillation was detected during workup, leading to a maze procedure being performed concurrently with the valve surgery. Despite this, the patient has had recurrent episodes of atrial fibrillation, with a recent monitor showing he was in atrial fibrillation about 12% of the time. The patient's heart rate during these episodes were not controlled with an average V rate of 112 bpm. The patient also reports a recent foot injury that has limited his ability to exercise.     Today, he reports that he is doing well -- no palpitations, shortness of breath, chest pain.  EKGs/Labs/Other Studies Reviewed Today:     Echocardiogram:  TTE November 2024 EF 55 to 60%.  Moderately dilated left atrium.  Grade 2 diastolic dysfunction   Monitors:  14 day monitor November 2024-- my  interpretation Sinus rhythm heart rate 50 to 223 bpm, average 79 12% burden of atrial fibrillation, average heart rate 112 bpm Rare ectopy   EKG:         Physical Exam:    VS:  BP 120/86 (BP Location: Left Arm, Patient Position: Sitting, Cuff Size: Normal)   Pulse 61   Ht 6\' 1"  (1.854 m)   Wt 159 lb 6.4 oz (72.3 kg)   SpO2 96%   BMI 21.03 kg/m     Wt Readings from Last 3 Encounters:  03/30/23 159 lb 6.4 oz (72.3 kg)  03/05/23 155 lb (70.3 kg)  01/13/23 140 lb (63.5 kg)     GEN: Well nourished, well developed in no acute distress CARDIAC: RRR, no murmurs, rubs, gallops RESPIRATORY:  Normal work of breathing MUSCULOSKELETAL: no edema    ASSESSMENT & PLAN:     Paroxysmal atrial fibrillation Minimally symptomatic Burden 14% rates not well-controlled He has had a prior Maze procedure and mitral valve repair We discussed management options; I explained the indication and rationale for remapping of the prior ablation with possible ablation  We discussed the indication, rationale, logistics, anticipated benefits, and potential risks of the ablation procedure including but not limited to -- bleed at the groin access site, chest pain, damage to nearby organs such as the diaphragm, lungs, or esophagus, need for a drainage tube, or prolonged hospitalization. I explained that the risk for stroke, heart attack, need for open chest surgery, or even death is very low but not zero. he  expressed understanding and wishes to proceed.  Severe nonrheumatic MVR S/p surgical repair with maze and left atrial appendage clip.      Signed, Maurice Small, MD  03/30/2023 3:04 PM    [facility]

## 2023-04-18 DIAGNOSIS — S92351D Displaced fracture of fifth metatarsal bone, right foot, subsequent encounter for fracture with routine healing: Secondary | ICD-10-CM | POA: Diagnosis not present

## 2023-04-23 ENCOUNTER — Encounter: Payer: Self-pay | Admitting: Cardiovascular Disease

## 2023-04-23 DIAGNOSIS — Z01810 Encounter for preprocedural cardiovascular examination: Secondary | ICD-10-CM

## 2023-04-24 ENCOUNTER — Telehealth: Payer: Self-pay

## 2023-04-24 DIAGNOSIS — H33002 Unspecified retinal detachment with retinal break, left eye: Secondary | ICD-10-CM | POA: Diagnosis not present

## 2023-04-24 DIAGNOSIS — H02831 Dermatochalasis of right upper eyelid: Secondary | ICD-10-CM | POA: Diagnosis not present

## 2023-04-24 DIAGNOSIS — H02834 Dermatochalasis of left upper eyelid: Secondary | ICD-10-CM | POA: Diagnosis not present

## 2023-04-24 DIAGNOSIS — H04123 Dry eye syndrome of bilateral lacrimal glands: Secondary | ICD-10-CM | POA: Diagnosis not present

## 2023-04-24 DIAGNOSIS — H31092 Other chorioretinal scars, left eye: Secondary | ICD-10-CM | POA: Diagnosis not present

## 2023-04-24 DIAGNOSIS — G245 Blepharospasm: Secondary | ICD-10-CM | POA: Diagnosis not present

## 2023-04-24 DIAGNOSIS — H353122 Nonexudative age-related macular degeneration, left eye, intermediate dry stage: Secondary | ICD-10-CM | POA: Diagnosis not present

## 2023-04-24 DIAGNOSIS — D3131 Benign neoplasm of right choroid: Secondary | ICD-10-CM | POA: Diagnosis not present

## 2023-04-24 NOTE — Telephone Encounter (Signed)
 Spoke with patient, labs to be completed on 04/26/23 for upcoming CT on 05/03/23. Ablation scheduled for 05/24/23. No questions at this time. Agrees to have letters sent thru MyChart

## 2023-04-26 ENCOUNTER — Telehealth (HOSPITAL_COMMUNITY): Payer: Self-pay

## 2023-04-26 ENCOUNTER — Telehealth: Payer: Self-pay | Admitting: Internal Medicine

## 2023-04-26 DIAGNOSIS — I480 Paroxysmal atrial fibrillation: Secondary | ICD-10-CM | POA: Diagnosis not present

## 2023-04-26 NOTE — Telephone Encounter (Signed)
 Left message for patient to call back

## 2023-04-26 NOTE — Telephone Encounter (Signed)
 Spoke with patient to complete one month pre-procedure call.     New medical conditions?  No Recent hospitalizations or surgeries? No Started any new medications? No Patient made aware to contact office to inform of any new medications started. Any changes in activities of daily living? No  Pre-procedure testing scheduled: CT on 05/03/23 and lab work ordered.  Confirmed patient is taking ELIQUIS and will continue taking medication before procedure or it may need to be rescheduled.  Confirmed patient is scheduled for Atrial Fibrillation Ablation on Thursday, March 20 with Dr. York Pellant. Instructed patient to arrive at the Main Entrance A at [facility]: [address] [place], [place] [address] and check in at Admitting at 5:30 AM  Advised of plan to go home the same day and will only stay overnight if medically necessary. You MUST have a responsible adult to drive you home and MUST be with you the first 24 hours after you arrive home or your procedure could be cancelled.  Patient verbalized understanding to information provided and is agreeable to proceed with procedure.

## 2023-04-26 NOTE — Telephone Encounter (Signed)
 Pt c/o medication issue:  1. Name of Medication: apixaban (ELIQUIS) 5 MG TABS tablet   2. How are you currently taking this medication (dosage and times per day)? Out of medication  3. Are you having a reaction (difficulty breathing--STAT)? no  4. What is your medication issue? Patient needs refill but when he went to pharmacy to pick up it was $500. He wants to know if there is an alternative medication.

## 2023-04-27 LAB — BASIC METABOLIC PANEL
BUN/Creatinine Ratio: 13 (ref 10–24)
BUN: 10 mg/dL (ref 8–27)
CO2: 28 mmol/L (ref 20–29)
Calcium: 9.3 mg/dL (ref 8.6–10.2)
Chloride: 100 mmol/L (ref 96–106)
Creatinine, Ser: 0.75 mg/dL — ABNORMAL LOW (ref 0.76–1.27)
Glucose: 93 mg/dL (ref 70–99)
Potassium: 4.5 mmol/L (ref 3.5–5.2)
Sodium: 141 mmol/L (ref 134–144)
eGFR: 96 mL/min/{1.73_m2} (ref >59)

## 2023-05-01 ENCOUNTER — Encounter: Payer: Self-pay | Admitting: Cardiovascular Disease

## 2023-05-03 ENCOUNTER — Ambulatory Visit (HOSPITAL_COMMUNITY)
Admission: RE | Admit: 2023-05-03 | Discharge: 2023-05-03 | Disposition: A | Discharge status: Home / Self Care | Payer: Medicare Other | Source: Ambulatory Visit | Attending: Cardiovascular Disease | Admitting: Cardiovascular Disease

## 2023-05-03 ENCOUNTER — Encounter: Payer: Self-pay | Admitting: Cardiovascular Disease

## 2023-05-03 DIAGNOSIS — I480 Paroxysmal atrial fibrillation: Secondary | ICD-10-CM | POA: Diagnosis not present

## 2023-05-03 MED ORDER — IOHEXOL 350 MG/ML SOLN
100.0000 mL | Freq: Once | INTRAVENOUS | Status: AC | PRN
  Administered 2023-05-03: 100 mL via INTRAVENOUS

## 2023-05-08 ENCOUNTER — Ambulatory Visit: Attending: Family Medicine | Admitting: Physical Therapy

## 2023-05-08 ENCOUNTER — Encounter: Payer: Self-pay | Admitting: Physical Therapy

## 2023-05-08 ENCOUNTER — Other Ambulatory Visit: Payer: Self-pay

## 2023-05-08 DIAGNOSIS — R293 Abnormal posture: Secondary | ICD-10-CM | POA: Insufficient documentation

## 2023-05-08 DIAGNOSIS — M6281 Muscle weakness (generalized): Secondary | ICD-10-CM | POA: Diagnosis not present

## 2023-05-08 DIAGNOSIS — R2689 Other abnormalities of gait and mobility: Secondary | ICD-10-CM

## 2023-05-08 DIAGNOSIS — Z09 Encounter for follow-up examination after completed treatment for conditions other than malignant neoplasm: Secondary | ICD-10-CM | POA: Diagnosis not present

## 2023-05-08 DIAGNOSIS — R2681 Unsteadiness on feet: Secondary | ICD-10-CM | POA: Diagnosis not present

## 2023-05-08 NOTE — Therapy (Signed)
 OUTPATIENT PHYSICAL THERAPY LOWER EXTREMITY EVALUATION   Patient Name: James Ibarra MRN: [id number] DOB:Dec 17, 1950, 73 y.o., male Today's Date: 05/08/2023  END OF SESSION:  PT End of Session - 05/08/23 1749     Visit Number 1    Date for PT Re-Evaluation 07/03/23    Authorization Type [employer] MCR (auth requested)    Progress Note Due on Visit 10    PT Start Time 1104    PT Stop Time 1144    PT Time Calculation (min) 40 min    Activity Tolerance Patient tolerated treatment well    Behavior During Therapy WFL for tasks assessed/performed             Past Medical History:  Diagnosis Date   Cancer (HCC)    Skin Cancer - Scalp, Right neck, Left Hand   Dysrhythmia    A. Fib while hospitalized June 2024   Heart murmur    History of nonmelanoma skin cancer    Hypercholesteremia    Hypertension    Pneumonia 08/2022   Trigeminal neuralgia    Past Surgical History:  Procedure Laterality Date   CATARACT EXTRACTION W/ INTRAOCULAR LENS IMPLANT Bilateral    CLIPPING OF ATRIAL APPENDAGE Left 12/08/2022   Procedure: CLIPPING OF ATRIAL APPENDAGE (LAA) USING 50 MM ATRICLIP;  Surgeon: Eugenio Hoes, MD;  Location: [facility] OR;  Service: Open Heart Surgery;  Laterality: Left;   INGUINAL HERNIA REPAIR  11/07/2011   Procedure: LAPAROSCOPIC INGUINAL HERNIA;  Surgeon: Atilano Ina, MD,FACS;  Location: [facility] ORS;  Service: General;  Laterality: Left;   MAZE N/A 12/08/2022   Procedure: MAZE;  Surgeon: Eugenio Hoes, MD;  Location: [facility] OR;  Service: Open Heart Surgery;  Laterality: N/A;   MITRAL VALVE REPAIR N/A 12/08/2022   Procedure: MITRAL VALVE REPAIR (MVR) USING 34 MM SIMULUS SEMI- RIGID ANNULOPLASTY BAND;  Surgeon: Eugenio Hoes, MD;  Location: [facility] OR;  Service: Open Heart Surgery;  Laterality: N/A;   RETINAL DETACHMENT SURGERY  03/07/1995   RIGHT/LEFT HEART CATH AND CORONARY ANGIOGRAPHY N/A 10/12/2022   Procedure: RIGHT/LEFT HEART CATH AND CORONARY ANGIOGRAPHY;  Surgeon: [name], MD;   Location: [facility] INVASIVE CV LAB;  Service: Cardiovascular;  Laterality: N/A;   SKIN CANCER EXCISION  2012, 1999   basal cell - neck and hand   TEE WITHOUT CARDIOVERSION N/A 10/16/2022   Procedure: TRANSESOPHAGEAL ECHOCARDIOGRAM;  Surgeon: Christell Constant, MD;  Location: [facility] INVASIVE CV LAB;  Service: Cardiovascular;  Laterality: N/A;   TEE WITHOUT CARDIOVERSION N/A 12/08/2022   Procedure: TRANSESOPHAGEAL ECHOCARDIOGRAM;  Surgeon: Eugenio Hoes, MD;  Location: [facility] OR;  Service: Open Heart Surgery;  Laterality: N/A;   Patient Active Problem List   Diagnosis Date Noted   S/P MVR (mitral valve repair) 12/08/2022   Protein-calorie malnutrition, severe 08/19/2022   Alcohol withdrawal (HCC) 08/14/2022   Acute metabolic encephalopathy 08/14/2022   Paroxysmal atrial fibrillation with RVR (HCC) 08/14/2022   Nonrheumatic mitral valve regurgitation 08/14/2022   Mitral valve prolapse 08/14/2022   PNA (pneumonia) 08/12/2022   Severe sepsis (HCC) 08/12/2022   HLD (hyperlipidemia) 08/12/2022   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   Trigeminal neuralgia of left side of face 07/15/2019   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 He feels off balance since he has been in the ICU after laying in the bed for a few weeks. He has been to physical therapy before for a broken bone in his ankle and he has not been compliant with HEP. He feels the  most unbalanced when he walks. He occasionally walks his dog during the week.   PERTINENT HISTORY: Skin cancer; HTN ; open heart surgery 2023 PAIN:  Are you having pain? No  PRECAUTIONS: None  RED FLAGS: None   WEIGHT BEARING RESTRICTIONS: No  FALLS:  Has patient fallen in last 6 months? No  LIVING ENVIRONMENT: Lives with: lives with their spouse Lives in: House/apartment Stairs: Yes: Internal: down to basement but rarely uses them steps; bilateral but cannot reach both and External: 8 steps; can reach both Has following equipment at home: Dan Humphreys - 2 wheeled  OCCUPATION: Retired; Environmental health practitioner in pharmacology   PLOF: Independent, Independent with basic ADLs, Independent with household mobility without device, Independent with community mobility without device, Independent with gait, and Independent with transfers  PATIENT GOALS: Strengthening his legs and working on balance  NEXT MD VISIT: PRN  OBJECTIVE:  Note: Objective measures were completed at Evaluation unless otherwise noted.    PATIENT SURVEYS:  ABC scale 71% moderate level of physical functioning  COGNITION: Overall cognitive status: Within functional limits for tasks assessed     SENSATION: WFL    POSTURE: rounded shoulders and forward head    LOWER EXTREMITY ROM: WFL bilateral    LOWER EXTREMITY MMT: Grossly 4/5       FUNCTIONAL TESTS:  5 times sit to stand: 15.12 sec with UE support Timed up and go (TUG): 9.49sec 3 minute walk test: 561 RPE:0-1  SL Balance Rt 12 sec  Lt 8 sec with finger touch on counter; < 2 sec with no UE support GAIT: Distance walked: 561FT Assistive device utilized: None Level of assistance: Complete Independence Comments: Wide BOS; decreased cadence                                                                                                                                TREATMENT DATE: 05/08/2023 Initial Evaluation & HEP created    PATIENT EDUCATION:   Education details: POC; HEP compliance; HEP Person educated: Patient Education method: Explanation, Demonstration, and Handouts Education comprehension: verbalized understanding, returned demonstration, and needs further education  HOME EXERCISE PROGRAM: Access Code: [email or web address] URL: [email or web address] Date: 05/08/2023 Prepared by: Claude Manges  Exercises - Sit to Stand with Armchair  - 1 x daily - 7 x weekly - 1 sets - 10 reps - Standing Hip Extension with Counter Support  - 1 x daily - 7 x  weekly - 2 sets - 10 reps - Standing Hip Abduction with Counter Support  - 1 x daily - 7 x weekly - 2 sets - 10 reps - Heel Raises with Counter Support  - 1 x daily - 7 x weekly - 2 sets - 10 reps - Single Leg Stance with Support  - 1 x daily - 7 x weekly - 1 sets - 30 hold  ASSESSMENT:  CLINICAL IMPRESSION: Patient is a 73 y.o. male who was seen today for physical therapy evaluation and treatment for physical deconditioning. Jesusita Oka presents to therapy with general weakness and balance deficits around having multiple hospitalizations last year. Patient had pneumonia and was hospitalized multiples days. Since then he verbalized feeling unsteady on his feet and very weak. He has had physical therapy at [facility] in the past and he verbalized not being compliant with HEP due to "laziness." Educated patient on different strategies he can use for better HEP compliance. Based on evaluation noted muscle weakness, poor single leg balance, and poor posture. Patient will benefit from skilled PT to address the below impairments and improve overall function.    OBJECTIVE IMPAIRMENTS: Abnormal gait, decreased activity tolerance, decreased balance, decreased endurance, difficulty walking, decreased strength, and postural dysfunction.   ACTIVITY LIMITATIONS: lifting, stairs, and locomotion level  PARTICIPATION LIMITATIONS: cleaning, laundry, shopping, and community activity  PERSONAL FACTORS: Age, Fitness,  Time since onset of injury/illness/exacerbation, and 1-2 comorbidities: HTN; hx of cancer  are also affecting patient's functional outcome.   REHAB POTENTIAL: Good  CLINICAL DECISION MAKING: Stable/uncomplicated  EVALUATION COMPLEXITY: Low   GOALS: Goals reviewed with patient? Yes  SHORT TERM GOALS: Target date: 06/05/2023  Patient will be independent with initial HEP. Baseline:  Goal status: INITIAL  2.  Patient will report > or = to 30% improvement in overall balance confidence since starting PT. Baseline:  Goal status: INITIAL  3.  Patient will be able to complete sit to stand without UE support for improved functional strength. Baseline:  Goal status: INITIAL    LONG TERM GOALS: Target date: 07/03/2023  Patient will demonstrate independence in advanced HEP. Baseline:  Goal status: INITIAL  2.  Patient will report > or = to 60% improvement in balance confidence since starting PT. Baseline:  Goal status: INITIAL   3.  Patient will score < or = to 12 sec on 5STS for improved functional strength. Baseline: 15.12 sec Goal status: INITIAL  4.  Patient will ambulate > or = to 638ft on for improved community negotiation.  Baseline:  Goal status: INITIAL  5.  Patient will be able to ascend / descend a curb for improved community negotiation. Baseline:  Goal status: INITIAL     PLAN:  PT FREQUENCY: 2x/week  PT DURATION: 8 weeks  PLANNED INTERVENTIONS: 97164- PT Re-evaluation, 97110-Therapeutic exercises, 97530- Therapeutic activity, 97112- Neuromuscular re-education, 97535- Self Care, [address]- Manual therapy, [phone]- Gait training, [phone]- Canalith repositioning, [id number]- Aquatic Therapy, 97016- Vasopneumatic device, [id number]- Traction (mechanical), [phone]- Ionotophoresis 4mg /ml Dexamethasone, Balance training, Stair training, Taping, Dry Needling, Joint mobilization, Joint manipulation, Spinal manipulation, Spinal mobilization, Vestibular training, Cryotherapy, and  Moist heat  PLAN FOR NEXT SESSION: Review HEP; DGI; Single leg strengthening (leg press/ step ups/staggered STS)   Claude Manges, PT 05/08/23 5:50 PM [facility] [address] [place], [place] [address] Phone # [phone] Fax [phone]

## 2023-05-10 ENCOUNTER — Ambulatory Visit: Admitting: Physical Therapy

## 2023-05-10 DIAGNOSIS — R2681 Unsteadiness on feet: Secondary | ICD-10-CM | POA: Diagnosis not present

## 2023-05-10 DIAGNOSIS — R293 Abnormal posture: Secondary | ICD-10-CM | POA: Diagnosis not present

## 2023-05-10 DIAGNOSIS — Z09 Encounter for follow-up examination after completed treatment for conditions other than malignant neoplasm: Secondary | ICD-10-CM | POA: Diagnosis not present

## 2023-05-10 DIAGNOSIS — M6281 Muscle weakness (generalized): Secondary | ICD-10-CM | POA: Diagnosis not present

## 2023-05-10 DIAGNOSIS — R2689 Other abnormalities of gait and mobility: Secondary | ICD-10-CM

## 2023-05-10 NOTE — Therapy (Signed)
 OUTPATIENT PHYSICAL THERAPY LOWER EXTREMITY PROGRESS NOTE   Patient Name: James Ibarra MRN: [id number] DOB:Feb 19, 1951, 73 y.o., male Today's Date: 05/10/2023  END OF SESSION:  PT End of Session - 05/10/23 1105     Visit Number 2    Date for PT Re-Evaluation 07/03/23    Authorization Type [employer] MCR Berkley Harvey requested)    Progress Note Due on Visit 10    PT Start Time 1101    PT Stop Time 1140    PT Time Calculation (min) 39 min    Activity Tolerance Patient tolerated treatment well             Past Medical History:  Diagnosis Date   Cancer (HCC)    Skin Cancer - Scalp, Right neck, Left Hand   Dysrhythmia    A. Fib while hospitalized June 2024   Heart murmur    History of nonmelanoma skin cancer    Hypercholesteremia    Hypertension    Pneumonia 08/2022   Trigeminal neuralgia    Past Surgical History:  Procedure Laterality Date   CATARACT EXTRACTION W/ INTRAOCULAR LENS IMPLANT Bilateral    CLIPPING OF ATRIAL APPENDAGE Left 12/08/2022   Procedure: CLIPPING OF ATRIAL APPENDAGE (LAA) USING 50 MM ATRICLIP;  Surgeon: Eugenio Hoes, MD;  Location: [facility] OR;  Service: Open Heart Surgery;  Laterality: Left;   INGUINAL HERNIA REPAIR  11/07/2011   Procedure: LAPAROSCOPIC INGUINAL HERNIA;  Surgeon: Atilano Ina, MD,FACS;  Location: [facility] ORS;  Service: General;  Laterality: Left;   MAZE N/A 12/08/2022   Procedure: MAZE;  Surgeon: Eugenio Hoes, MD;  Location: [facility] OR;  Service: Open Heart Surgery;  Laterality: N/A;   MITRAL VALVE REPAIR N/A 12/08/2022   Procedure: MITRAL VALVE REPAIR (MVR) USING 34 MM SIMULUS SEMI- RIGID ANNULOPLASTY BAND;  Surgeon: Eugenio Hoes, MD;  Location: [facility] OR;  Service: Open Heart Surgery;  Laterality: N/A;   RETINAL DETACHMENT SURGERY  03/07/1995   RIGHT/LEFT HEART CATH AND CORONARY ANGIOGRAPHY N/A 10/12/2022   Procedure: RIGHT/LEFT HEART CATH AND CORONARY ANGIOGRAPHY;  Surgeon: [name], MD;  Location: [facility] IN[facility];  Service: Cardiovascular;   Laterality: N/A;   SKIN CANCER EXCISION  2012, 1999   basal cell - neck and hand   TEE WITHOUT CARDIOVERSION N/A 10/16/2022   Procedure: TRANSESOPHAGEAL ECHOCARDIOGRAM;  Surgeon: Christell Constant, MD;  Location: [facility];  Service: Cardiovascular;  Laterality: N/A;   TEE WITHOUT CARDIOVERSION N/A 12/08/2022   Procedure: TRANSESOPHAGEAL ECHOCARDIOGRAM;  Surgeon: Eugenio Hoes, MD;  Location: [facility] OR;  Service: Open Heart Surgery;  Laterality: N/A;   Patient Active Problem List   Diagnosis Date Noted   S/P MVR (mitral valve repair) 12/08/2022   Protein-calorie malnutrition, severe 08/19/2022   Alcohol withdrawal (HCC) 08/14/2022   Acute metabolic encephalopathy 08/14/2022   Paroxysmal atrial fibrillation with RVR (HCC) 08/14/2022   Nonrheumatic mitral valve regurgitation 08/14/2022   Mitral valve prolapse 08/14/2022   PNA (pneumonia) 08/12/2022   Severe sepsis (HCC) 08/12/2022   HLD (hyperlipidemia) 08/12/2022   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   Trigeminal neuralgia of left side of face 07/15/2019   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  I feel like my calves are weak.  Difficulty getting out of the low car.     PERTINENT HISTORY: Skin cancer; HTN ; open heart surgery 2023 PAIN:  Are you having pain? No  PRECAUTIONS: None  RED FLAGS: None   WEIGHT BEARING RESTRICTIONS: No  FALLS:  Has patient fallen in last 6 months? No  LIVING  ENVIRONMENT: Lives with: lives with their spouse Lives in: House/apartment Stairs: Yes: Internal: down to basement but rarely uses them steps; bilateral but cannot reach both and External: 8 steps; can reach both Has following equipment at home: Dan Humphreys - 2 wheeled  OCCUPATION: Retired; Environmental health practitioner in pharmacology   PLOF: Independent, Independent with basic ADLs, Independent with household mobility without device, Independent with community mobility without device, Independent with gait, and Independent with transfers  PATIENT GOALS: Strengthening his legs and working on balance  NEXT MD VISIT: PRN  OBJECTIVE:  Note: Objective measures were completed at Evaluation unless otherwise noted.    PATIENT SURVEYS:  ABC scale 71% moderate level of physical functioning  COGNITION: Overall cognitive status: Within functional limits for tasks assessed     SENSATION: WFL    POSTURE: rounded shoulders and forward head    LOWER EXTREMITY ROM: WFL bilateral    LOWER EXTREMITY MMT: Grossly 4/5       FUNCTIONAL TESTS:  5 times sit to stand: 15.12 sec with UE support Timed up and go (TUG): 9.49sec 3 minute walk test: 561 RPE:0-1  SL Balance Rt 12 sec  Lt 8 sec with finger touch on counter; < 2 sec with no UE support GAIT: Distance walked: 561FT Assistive device utilized: None Level of assistance: Complete Independence Comments: Wide BOS; decreased cadence  DYNAMIC GAIT INDEX: 3/6: 18/24  MINI-BESTest: Balance Evaluation Systems Test ANTICIPATORY: SIT TO STAND: (2) normal without use of hands and stabilizes independently     (1) Moderate comes to stand WITH use of hands on 1st attempt    (0) Severe: unable to stand up from chair without assistance OR needs several attempts with use of   hands Score: 2  2. RISE TO TOES: feet shoulder width apart. Hands on hips.  Rise as high as you can onto your toes. Try to hold this pose for 3 sec                          (2) stable  for 3s with maximum height    (1) heels up, but not full range (smaller than when holding hands) OR noticeable instability for 3 sec                           (0) < or equat to 3 s  Score: 1  REACTIVE POSTURAL CONTROL 4. COMPENSATORY STEPPING CORRECTION FORWARD stand with feet shoulder width apart, arms at your sides. Lean forward against my hands beyond your forward limits.  When I let go, do whatever is necessary, including taking a step, to avoid a fall   (2) recovers independently  with a single, large step, to avoid a fall   (1) more than one step used to recover equilibrium   (0) No step OR would fall if not caught Score: 1  5. COMPENSATORY STEPPING CORRECTION BACKWARD: Lean backward against my hands beyond your backward limits.  When I let go, do whatever is necessary, including a step to avoid a fall. (2) recovers independently  with a single, large step, to avoid a fall   (1) more than one step used to recover equilibrium   (0) No step OR would fall if not caught Score: 1  6. COMPENSATORY STEPPING LATERAL: Lean into my hand beyond your sideways limit.  When I let go, do whatever is necessary, including taking a step, to avoid a fall. Left           Right   (2) recovers independently with 1 step (crossover or lateral OK)   (1) several steps to recover equilibrium   (0) falls or cannot step Score: 1   SENSORY ORIENTATION 7. STANCE FEET TOGETHER EYES OPEN  firm surface Be as stable and still as possible until I say stop   (2) 30s   (1) < 30 s   (0) unable Score: 2  8. STANCE ON FOAM EYES CLOSED feet together, eyes closed, hands on hips. Be as stable and still as you can until I stay stop.  I will start timing when you close your eyes. Time in seconds:      (2) 30s   (1) <30 s   (0) unable Score: 1  9. INCLINE EYES CLOSED: Stand on incline with feet shoulder width apart, arms down at your sides.  I will start timing when you close your eyes   (2) Stands 30 s and aligns  with gravity   (1) stands < 30 s OR aligns with surface   (0) unable Score: 1  DYNAMIC GAIT 10. CHANGE IN GAIT SPEED: begin walking at your normal speed, when I tell you "fast", walk as fast as you can, when I say "slow" walk very slowly   (2) significantly changes walking speed without imbalance   (1) unable to change walking speed or signs of imbalance   (0) unable to achieve significant change in walking speed AND signes of imbalance Score: 1  11. WALK WITH HEAD TURNS HORIZONTAL: begin walking at normal speed, when I say right, turn your head to the right, when I say left turn your head to the left.  Try to keep yourself walking in a straight line   (2) performs head turns with no change in gait speed and good balance   (1) performs head turns with reduction in gait speed   (0)  performs head turns with imbalance Score: 1  12. WALK WITH PIVOT TURNS: begin walking at your normal speed.  When I tell you to "turn and stop", turn as quickly as you can, face the opposite direction and stop.  After the turn, your feet should be close together   (2) turns with feet close FAST in 3 steps with good balance   (1) turns with feet close SLOW > 4 steps with good balance   (0) cannot turn with feet close at any speed without imbalance Score: 1  13. STEP OVER OBSTACLES:  ( 9 inch height 10 feet away from subject) begin walking at your normal speed.  When you get to the box, step over it, not around it and keep walking   (2) Able to step over box with minimal change of gait speed and good balance   (1) Steps over box but touches box OR displays cautious behavior by slowing gait   (0) Unable  to step over box OR steps around box Score: 1                                                                                                                                 TREATMENT DATE:  05/10/2023: Nu-Step L3 6 min while discussing status and plan DGI as above Some components of Mini-Best test as  above Sit to stand hi-low table (just (right knee pain) RPE: 1/10 Heel raises bil 5x, staggered stance 5x each way Holding to counter reverse lunge 5x right/left 2 sets Leg press seat 8 80# bil 15x; 40# single leg press 10x right/left  RPE 3/10   05/08/2023 Initial Evaluation & HEP created    PATIENT EDUCATION:  Education details: POC; HEP compliance; HEP Person educated: Patient Education method: Explanation, Demonstration, and Handouts Education comprehension: verbalized understanding, returned demonstration, and needs further education  HOME EXERCISE PROGRAM: Access Code: [email or web address] URL: [email or web address] Date: 05/08/2023 Prepared by: Claude Manges  Exercises - Sit to Stand with Armchair  - 1 x daily - 7 x weekly - 1 sets - 10 reps - Standing Hip Extension with Counter Support  - 1 x daily - 7 x weekly - 2 sets - 10 reps - Standing Hip Abduction with Counter Support  - 1 x daily - 7 x weekly - 2 sets - 10 reps - Heel Raises with Counter Support  - 1 x daily - 7 x weekly - 2 sets - 10 reps - Single Leg Stance with Support  - 1 x daily - 7 x weekly - 1 sets - 30 hold  ASSESSMENT:  CLINICAL IMPRESSION: Balance testing indicates impairment with single leg balance tasks, sensory orientation and reactionary balance.  DGI indicates moderate impairment with a score of 18/24.  Difficulty maintaining gait speed with head movements.  Initiated LE strengthening as well with reports of legs feeling tired and "wobbly" end of session however RPE  low 1-3/10.  Therapist providing supervision and CGA at times for balance safety.      OBJECTIVE IMPAIRMENTS: Abnormal gait, decreased activity tolerance, decreased balance, decreased endurance, difficulty walking, decreased strength, and postural dysfunction.   ACTIVITY LIMITATIONS: lifting, stairs, and locomotion level  PARTICIPATION LIMITATIONS: cleaning, laundry, shopping, and community activity  PERSONAL FACTORS: Age,  Fitness, Time since onset of injury/illness/exacerbation, and 1-2 comorbidities: HTN; hx of cancer  are also affecting patient's functional outcome.   REHAB POTENTIAL: Good  CLINICAL DECISION MAKING: Stable/uncomplicated  EVALUATION COMPLEXITY: Low   GOALS: Goals reviewed with patient? Yes  SHORT TERM GOALS: Target date: 06/05/2023  Patient will be independent with initial HEP. Baseline:  Goal status: INITIAL  2.  Patient will report > or = to 30% improvement in overall balance confidence since starting PT. Baseline:  Goal status: INITIAL  3.  Patient will be able to complete sit to stand without UE support for improved functional strength. Baseline:  Goal status: INITIAL    LONG  TERM GOALS: Target date: 07/03/2023  Patient will demonstrate independence in advanced HEP. Baseline:  Goal status: INITIAL  2.  Patient will report > or = to 60% improvement in balance confidence since starting PT. Baseline:  Goal status: INITIAL   3.  Patient will score < or = to 12 sec on 5STS for improved functional strength. Baseline: 15.12 sec Goal status: INITIAL  4.  Patient will ambulate > or = to 693ft on for improved community negotiation.  Baseline:  Goal status: INITIAL  5.  Patient will be able to ascend / descend a curb for improved community negotiation. Baseline:  Goal status: INITIAL     PLAN:  PT FREQUENCY: 2x/week  PT DURATION: 8 weeks  PLANNED INTERVENTIONS: 97164- PT Re-evaluation, 97110-Therapeutic exercises, 97530- Therapeutic activity, 97112- Neuromuscular re-education, 97535- Self Care, 32202- Manual therapy, [phone]- Gait training, [phone]- Canalith repositioning, [id number]- Aquatic Therapy, 97016- Vasopneumatic device, [id number]- Traction (mechanical), [phone]- Ionotophoresis 4mg /ml Dexamethasone, Balance training, Stair training, Taping, Dry Needling, Joint mobilization, Joint manipulation, Spinal manipulation, Spinal mobilization, Vestibular training, Cryotherapy,  and Moist heat  PLAN FOR NEXT SESSION:  leg strengthening (leg press/ step ups/staggered STS); dynamic balance  Lavinia Sharps, PT 05/10/23 5:26 PM Phone: [phone] Fax: [phone]

## 2023-05-13 ENCOUNTER — Other Ambulatory Visit: Payer: Self-pay | Admitting: Physician Assistant

## 2023-05-14 ENCOUNTER — Other Ambulatory Visit: Payer: Self-pay

## 2023-05-14 MED ORDER — METOPROLOL TARTRATE 50 MG PO TABS: 50.0000 mg | ORAL_TABLET | Freq: Two times a day (BID) | ORAL | 3 refills | Status: DC

## 2023-05-15 ENCOUNTER — Ambulatory Visit: Admitting: Physical Therapy

## 2023-05-15 DIAGNOSIS — R2681 Unsteadiness on feet: Secondary | ICD-10-CM | POA: Diagnosis not present

## 2023-05-15 DIAGNOSIS — M6281 Muscle weakness (generalized): Secondary | ICD-10-CM

## 2023-05-15 DIAGNOSIS — R293 Abnormal posture: Secondary | ICD-10-CM | POA: Diagnosis not present

## 2023-05-15 DIAGNOSIS — R2689 Other abnormalities of gait and mobility: Secondary | ICD-10-CM

## 2023-05-15 DIAGNOSIS — Z09 Encounter for follow-up examination after completed treatment for conditions other than malignant neoplasm: Secondary | ICD-10-CM | POA: Diagnosis not present

## 2023-05-15 DIAGNOSIS — R5381 Other malaise: Secondary | ICD-10-CM

## 2023-05-15 NOTE — Telephone Encounter (Signed)
 I called the patient again.  He said that he ran out of Eliquis 5 mg twice a day.  He bought a 90 day supply at [facility] [employer].  After that he requested a refill from his PCP sent to [facility].  He should have enough of a supply.

## 2023-05-15 NOTE — Therapy (Signed)
 OUTPATIENT PHYSICAL THERAPY TREATMENT   Patient Name: RAYDEN DOCK MRN: [id number] DOB:[birth year], 73 y.o., male Today's Date: 05/15/2023  END OF SESSION:  PT End of Session - 05/15/23 1144     Visit Number 3    Date for PT Re-Evaluation 07/03/23    Authorization Type [employer] MCR (auth requested)    Progress Note Due on Visit 10    PT Start Time 1145    PT Stop Time 1225    PT Time Calculation (min) 40 min    Activity Tolerance Patient tolerated treatment well              Past Medical History:  Diagnosis Date   Cancer (HCC)    Skin Cancer - Scalp, Right neck, Left Hand   Dysrhythmia    A. Fib while hospitalized June 2024   Heart murmur    History of nonmelanoma skin cancer    Hypercholesteremia    Hypertension    Pneumonia 08/2022   Trigeminal neuralgia    Past Surgical History:  Procedure Laterality Date   CATARACT EXTRACTION W/ INTRAOCULAR LENS IMPLANT Bilateral    CLIPPING OF ATRIAL APPENDAGE Left 12/08/2022   Procedure: CLIPPING OF ATRIAL APPENDAGE (LAA) USING 50 MM ATRICLIP;  Surgeon: Eugenio Hoes, MD;  Location: [facility] OR;  Service: Open Heart Surgery;  Laterality: Left;   INGUINAL HERNIA REPAIR  11/07/2011   Procedure: LAPAROSCOPIC INGUINAL HERNIA;  Surgeon: Atilano Ina, MD,FACS;  Location: [facility] ORS;  Service: General;  Laterality: Left;   MAZE N/A 12/08/2022   Procedure: MAZE;  Surgeon: Eugenio Hoes, MD;  Location: [facility] OR;  Service: Open Heart Surgery;  Laterality: N/A;   MITRAL VALVE REPAIR N/A 12/08/2022   Procedure: MITRAL VALVE REPAIR (MVR) USING 34 MM SIMULUS SEMI- RIGID ANNULOPLASTY BAND;  Surgeon: Eugenio Hoes, MD;  Location: [facility] OR;  Service: Open Heart Surgery;  Laterality: N/A;   RETINAL DETACHMENT SURGERY  03/07/1995   RIGHT/LEFT HEART CATH AND CORONARY ANGIOGRAPHY N/A 10/12/2022   Procedure: RIGHT/LEFT HEART CATH AND CORONARY ANGIOGRAPHY;  Surgeon: [name], MD;  Location: [facility] INVASIVE [facility];  Service: Cardiovascular;  Laterality: N/A;   SKIN  CANCER EXCISION  2012, 1999   basal cell - neck and hand   TEE WITHOUT CARDIOVERSION N/A 10/16/2022   Procedure: TRANSESOPHAGEAL ECHOCARDIOGRAM;  Surgeon: Christell Constant, MD;  Location: [facility];  Service: Cardiovascular;  Laterality: N/A;   TEE WITHOUT CARDIOVERSION N/A 12/08/2022   Procedure: TRANSESOPHAGEAL ECHOCARDIOGRAM;  Surgeon: Eugenio Hoes, MD;  Location: [facility] OR;  Service: Open Heart Surgery;  Laterality: N/A;   Patient Active Problem List   Diagnosis Date Noted   S/P MVR (mitral valve repair) 12/08/2022   Protein-calorie malnutrition, severe 08/19/2022   Alcohol withdrawal (HCC) 08/14/2022   Acute metabolic encephalopathy 08/14/2022   Paroxysmal atrial fibrillation with RVR (HCC) 08/14/2022   Nonrheumatic mitral valve regurgitation 08/14/2022   Mitral valve prolapse 08/14/2022   PNA (pneumonia) 08/12/2022   Severe sepsis (HCC) 08/12/2022   HLD (hyperlipidemia) 08/12/2022   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   Trigeminal neuralgia of left side of face 07/15/2019   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 Pt reports he did do some exercise yesterday. Pt states he has not been consistent.   PERTINENT HISTORY: Skin cancer; HTN ; open heart surgery 2023  PAIN:  Are you having pain? No  PRECAUTIONS: None  WEIGHT BEARING RESTRICTIONS: No  FALLS:  Has patient fallen in  last 6 months? No  LIVING ENVIRONMENT: Lives with: lives with their spouse Lives in: House/apartment Stairs: Yes: Internal: down to basement but rarely uses them steps; bilateral but cannot reach both and External: 8 steps; can reach both Has following equipment at home: Dan Humphreys - 2 wheeled  OCCUPATION: Retired; Environmental health practitioner in pharmacology   PLOF: Independent, Independent with basic ADLs, Independent with household mobility without device, Independent with community mobility without device, Independent with gait, and Independent with transfers  PATIENT GOALS: Strengthening his legs and working on balance  NEXT MD VISIT: PRN  OBJECTIVE:  Note: Objective measures were completed at Evaluation unless otherwise noted.  PATIENT SURVEYS:  ABC scale 71% moderate level of physical functioning  POSTURE: rounded shoulders and forward head   LOWER EXTREMITY MMT: Grossly 4/5    FUNCTIONAL TESTS:  5 times sit to stand: 15.12 sec with UE support Timed up and go (TUG): 9.49sec 3 minute walk test: 561 RPE:0-1  SL Balance Rt 12 sec  Lt 8 sec with finger touch on counter; < 2 sec with no UE support GAIT: Distance walked: 561FT Assistive device utilized: None Level of assistance: Complete Independence Comments: Wide BOS; decreased cadence  DYNAMIC GAIT INDEX: 3/6: 18/24  MINI-BESTest: Balance Evaluation Systems Test ANTICIPATORY: SIT TO STAND: (2) normal without use of hands and stabilizes independently     (1) Moderate comes to stand WITH use of hands on 1st attempt    (0) Severe: unable to stand up from chair without assistance OR needs several attempts with use of   hands Score: 2  2. RISE TO TOES: feet shoulder width apart. Hands on hips.  Rise as high as you can onto your toes. Try to hold this pose for 3 sec                          (2) stable for 3s with maximum height    (1) heels up, but not full range (smaller than when holding hands) OR noticeable instability for 3 sec                            (0) < or equat to 3 s  Score: 1  REACTIVE POSTURAL CONTROL 4. COMPENSATORY STEPPING CORRECTION FORWARD stand with feet shoulder width apart, arms at your sides. Lean forward against my hands beyond your forward limits.  When I let go, do whatever is necessary, including taking a step, to avoid a fall   (2) recovers independently  with a single, large step, to avoid a fall   (1) more than one step used to recover equilibrium   (0) No step OR would fall if not caught Score: 1  5. COMPENSATORY STEPPING CORRECTION BACKWARD: Lean backward against my hands beyond your backward limits.  When I let go, do whatever is necessary, including a step to avoid a fall. (2) recovers independently  with a single, large step, to avoid a fall   (1) more than one step used to recover equilibrium   (0) No step OR would fall if not caught Score: 1  6. COMPENSATORY STEPPING LATERAL: Lean into my hand beyond your sideways limit.  When I let go, do whatever is necessary, including taking a step, to avoid a fall. Left           Right   (2) recovers independently with 1 step (crossover or lateral OK)   (1) several steps to recover equilibrium   (0) falls or cannot step Score: 1   SENSORY ORIENTATION 7. STANCE FEET TOGETHER EYES OPEN  firm surface Be as stable and still as possible until I say stop   (2) 30s   (1) < 30 s   (0) unable Score: 2  8. STANCE ON FOAM EYES CLOSED feet together, eyes closed, hands on hips. Be as stable and still as you can until I stay stop.  I will start timing when you close your eyes. Time in seconds:      (2) 30s   (1) <30 s   (0) unable Score: 1  9. INCLINE EYES CLOSED: Stand on incline with feet shoulder width apart, arms down at your sides.  I will start timing when you close your eyes   (2) Stands 30 s and aligns with gravity   (1) stands < 30 s OR aligns with surface   (0) unable Score: 1  DYNAMIC GAIT 10. CHANGE IN GAIT SPEED: begin walking  at your normal speed, when I tell you "fast", walk as fast as you can, when I say "slow" walk very slowly   (2) significantly changes walking speed without imbalance   (1) unable to change walking speed or signs of imbalance   (0) unable to achieve significant change in walking speed AND signes of imbalance Score: 1  11. WALK WITH HEAD TURNS HORIZONTAL: begin walking at normal speed, when I say right, turn your head to the right, when I say left turn your head to the left.  Try to keep yourself walking in a straight line   (2) performs head turns with no change in gait speed and good balance   (1) performs head turns with reduction in gait speed   (0)  performs head turns with imbalance Score: 1  12. WALK WITH PIVOT TURNS: begin walking at your normal speed.  When I tell you to "turn and stop", turn as quickly as you can, face the opposite direction and stop.  After the turn, your feet should be close together   (2) turns with feet close FAST in 3 steps with good balance   (1) turns with feet close SLOW > 4 steps with good balance   (0) cannot turn with feet close at any speed without imbalance Score: 1  13. STEP OVER OBSTACLES:  ( 9 inch height 10 feet away from subject) begin walking at your normal speed.  When you get to the box, step over it, not around it and keep walking   (2) Able to step over box with minimal change of gait speed and good balance   (1) Steps over box but touches box OR displays cautious behavior by slowing gait   (0) Unable to step over box OR steps around box Score: 1  TREATMENT DATE:  05/15/23: Nustep L5 x 6 min UEs/LEs Standing heel/toe raise 2x10 PWR!up 2x10 PWR!rock 2x10 PWR!step 2x10 Standing feet together on airex EO 2x30 sec Standing feet together on airex EO, head rotations 2x30 sec; head nods 2x30 sec Standing feet apart on  airex EC 2x30 sec Side step up and over pink airex 2x10  05/10/2023: Nu-Step L3 6 min while discussing status and plan DGI as above Some components of Mini-Best test as above Sit to stand hi-low table (just (right knee pain) RPE: 1/10 Heel raises bil 5x, staggered stance 5x each way Holding to counter reverse lunge 5x right/left 2 sets Leg press seat 8 80# bil 15x; 40# single leg press 10x right/left  RPE 3/10   05/08/2023 Initial Evaluation & HEP created    PATIENT EDUCATION:  Education details: POC; HEP compliance; HEP Person educated: Patient Education method: Explanation, Demonstration, and Handouts Education comprehension: verbalized understanding, returned demonstration, and needs further education  HOME EXERCISE PROGRAM: Access Code: [email or web address] URL: [email or web address] Date: 05/08/2023 Prepared by: Claude Manges  Exercises - Sit to Stand with Armchair  - 1 x daily - 7 x weekly - 1 sets - 10 reps - Standing Hip Extension with Counter Support  - 1 x daily - 7 x weekly - 2 sets - 10 reps - Standing Hip Abduction with Counter Support  - 1 x daily - 7 x weekly - 2 sets - 10 reps - Heel Raises with Counter Support  - 1 x daily - 7 x weekly - 2 sets - 10 reps - Single Leg Stance with Support  - 1 x daily - 7 x weekly - 1 sets - 30 hold  ASSESSMENT:  CLINICAL IMPRESSION: Continued to work on bilat LE strength, endurance, dynamic balance and weight shifting. Pt requires CGA for safety. Minor LOBs and fatigue noted with stepping on/off airex foam. Challenged standing on foam with head turns.   OBJECTIVE IMPAIRMENTS: Abnormal gait, decreased activity tolerance, decreased balance, decreased endurance, difficulty walking, decreased strength, and postural dysfunction.     GOALS: Goals reviewed with patient? Yes  SHORT TERM GOALS: Target date: 06/05/2023  Patient will be independent with initial HEP. Baseline:  Goal status: INITIAL  2.  Patient will report > or =  to 30% improvement in overall balance confidence since starting PT. Baseline:  Goal status: INITIAL  3.  Patient will be able to complete sit to stand without UE support for improved functional strength. Baseline:  Goal status: INITIAL    LONG TERM GOALS: Target date: 07/03/2023  Patient will demonstrate independence in advanced HEP. Baseline:  Goal status: INITIAL  2.  Patient will report > or = to 60% improvement in balance confidence since starting PT. Baseline:  Goal status: INITIAL   3.  Patient will score < or = to 12 sec on 5STS for improved functional strength. Baseline: 15.12 sec Goal status: INITIAL  4.  Patient will ambulate > or = to 663ft on for improved community negotiation.  Baseline:  Goal status: INITIAL  5.  Patient will be able to ascend / descend a curb for improved community negotiation. Baseline:  Goal status: INITIAL     PLAN:  PT FREQUENCY: 2x/week  PT DURATION: 8 weeks  PLANNED INTERVENTIONS: 97164- PT Re-evaluation, 97110-Therapeutic exercises, 97530- Therapeutic activity, [id number]- Neuromuscular re-education, 97535- Self Care, [id number]- Manual therapy, [id number]- Gait training, [phone]- Canalith repositioning, [id number]- Aquatic Therapy, 97016- Vasopneumatic device, [id number]- Traction (mechanical), [id number]- Ionotophoresis 4mg /ml Dexamethasone, Balance training, Stair training, Taping,  Dry Needling, Joint mobilization, Joint manipulation, Spinal manipulation, Spinal mobilization, Vestibular training, Cryotherapy, and Moist heat  PLAN FOR NEXT SESSION:  leg strengthening (leg press/ step ups/staggered STS); dynamic balance  Glen Blatchley April Dell Ponto, PT 05/15/23 11:44 AM Phone: [phone] Fax: [phone]

## 2023-05-17 ENCOUNTER — Telehealth (HOSPITAL_COMMUNITY): Payer: Self-pay

## 2023-05-17 ENCOUNTER — Ambulatory Visit: Admitting: Physical Therapy

## 2023-05-17 DIAGNOSIS — R293 Abnormal posture: Secondary | ICD-10-CM

## 2023-05-17 DIAGNOSIS — R2681 Unsteadiness on feet: Secondary | ICD-10-CM | POA: Diagnosis not present

## 2023-05-17 DIAGNOSIS — Z09 Encounter for follow-up examination after completed treatment for conditions other than malignant neoplasm: Secondary | ICD-10-CM | POA: Diagnosis not present

## 2023-05-17 DIAGNOSIS — M6281 Muscle weakness (generalized): Secondary | ICD-10-CM

## 2023-05-17 DIAGNOSIS — R5381 Other malaise: Secondary | ICD-10-CM

## 2023-05-17 DIAGNOSIS — R2689 Other abnormalities of gait and mobility: Secondary | ICD-10-CM

## 2023-05-17 NOTE — Therapy (Signed)
 OUTPATIENT PHYSICAL THERAPY TREATMENT   Patient Name: James Ibarra MRN: [id number] DOB:1950/04/24, 73 y.o., male Today's Date: 05/17/2023  END OF SESSION:  PT End of Session - 05/17/23 1147     Visit Number 4    Date for PT Re-Evaluation 07/03/23    Authorization Type [employer] MCR (auth requested)    Progress Note Due on Visit 10    PT Start Time 1145    PT Stop Time 1225    PT Time Calculation (min) 40 min    Activity Tolerance Patient tolerated treatment well              Past Medical History:  Diagnosis Date   Cancer (HCC)    Skin Cancer - Scalp, Right neck, Left Hand   Dysrhythmia    A. Fib while hospitalized June 2024   Heart murmur    History of nonmelanoma skin cancer    Hypercholesteremia    Hypertension    Pneumonia 08/2022   Trigeminal neuralgia    Past Surgical History:  Procedure Laterality Date   CATARACT EXTRACTION W/ INTRAOCULAR LENS IMPLANT Bilateral    CLIPPING OF ATRIAL APPENDAGE Left 12/08/2022   Procedure: CLIPPING OF ATRIAL APPENDAGE (LAA) USING 50 MM ATRICLIP;  Surgeon: Eugenio Hoes, MD;  Location: [facility] OR;  Service: Open Heart Surgery;  Laterality: Left;   INGUINAL HERNIA REPAIR  11/07/2011   Procedure: LAPAROSCOPIC INGUINAL HERNIA;  Surgeon: Atilano Ina, MD,FACS;  Location: [facility];  Service: General;  Laterality: Left;   MAZE N/A 12/08/2022   Procedure: MAZE;  Surgeon: Eugenio Hoes, MD;  Location: [facility];  Service: Open Heart Surgery;  Laterality: N/A;   MITRAL VALVE REPAIR N/A 12/08/2022   Procedure: MITRAL VALVE REPAIR (MVR) USING 34 MM SIMULUS SEMI- RIGID ANNULOPLASTY BAND;  Surgeon: Eugenio Hoes, MD;  Location: [facility] OR;  Service: Open Heart Surgery;  Laterality: N/A;   RETINAL DETACHMENT SURGERY  03/07/1995   RIGHT/LEFT HEART CATH AND CORONARY ANGIOGRAPHY N/A 10/12/2022   Procedure: RIGHT/LEFT HEART CATH AND CORONARY ANGIOGRAPHY;  Surgeon: [name], MD;  Location: [facility];  Service: Cardiovascular;  Laterality: N/A;   SKIN  CANCER EXCISION  2012, 1999   basal cell - neck and hand   TEE WITHOUT CARDIOVERSION N/A 10/16/2022   Procedure: TRANSESOPHAGEAL ECHOCARDIOGRAM;  Surgeon: Christell Constant, MD;  Location: [facility];  Service: Cardiovascular;  Laterality: N/A;   TEE WITHOUT CARDIOVERSION N/A 12/08/2022   Procedure: TRANSESOPHAGEAL ECHOCARDIOGRAM;  Surgeon: Eugenio Hoes, MD;  Location: [facility] OR;  Service: Open Heart Surgery;  Laterality: N/A;   Patient Active Problem List   Diagnosis Date Noted   S/P MVR (mitral valve repair) 12/08/2022   Protein-calorie malnutrition, severe 08/19/2022   Alcohol withdrawal (HCC) 08/14/2022   Acute metabolic encephalopathy 08/14/2022   Paroxysmal atrial fibrillation with RVR (HCC) 08/14/2022   Nonrheumatic mitral valve regurgitation 08/14/2022   Mitral valve prolapse 08/14/2022   PNA (pneumonia) 08/12/2022   Severe sepsis (HCC) 08/12/2022   HLD (hyperlipidemia) 08/12/2022   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   Trigeminal neuralgia of left side of face 07/15/2019   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   Hypertension 10/31/2013   Pain in joint, shoulder region 04/14/2013   Nodule of right lung 11/07/2011    PCP: Farris Has, MD  REFERRING PROVIDER: Farris Has, MD  REFERRING DIAG: [phone] (ICD-10-CM) [facility] discharge follow-up  THERAPY DIAG:  Unsteadiness on feet  Other abnormalities of gait and  mobility  Muscle weakness (generalized)  Abnormal posture  Physical deconditioning  Rationale for Evaluation and Treatment: Rehabilitation  ONSET DATE: June 2024  SUBJECTIVE:  Pt states he took out his foot insert to see if this will help his knee pain -- did not see a difference. Reports he is stiff this morning.   PERTINENT HISTORY: Skin cancer; HTN ; open heart surgery 2023  PAIN:  Are you having pain? No  PRECAUTIONS: None  WEIGHT BEARING RESTRICTIONS: No  FALLS:  Has patient fallen in last 6 months?  No  LIVING ENVIRONMENT: Lives with: lives with their spouse Lives in: House/apartment Stairs: Yes: Internal: down to basement but rarely uses them steps; bilateral but cannot reach both and External: 8 steps; can reach both Has following equipment at home: Dan Humphreys - 2 wheeled  OCCUPATION: Retired; Environmental health practitioner in pharmacology   PLOF: Independent, Independent with basic ADLs, Independent with household mobility without device, Independent with community mobility without device, Independent with gait, and Independent with transfers  PATIENT GOALS: Strengthening his legs and working on balance  NEXT MD VISIT: PRN  OBJECTIVE:  Note: Objective measures were completed at Evaluation unless otherwise noted.  PATIENT SURVEYS:  ABC scale 71% moderate level of physical functioning  POSTURE: rounded shoulders and forward head   LOWER EXTREMITY MMT: Grossly 4/5    FUNCTIONAL TESTS:  5 times sit to stand: 15.12 sec with UE support Timed up and go (TUG): 9.49sec 3 minute walk test: 561 RPE:0-1  SL Balance Rt 12 sec  Lt 8 sec with finger touch on counter; < 2 sec with no UE support  GAIT: Distance walked: 561FT Assistive device utilized: None Level of assistance: Complete Independence Comments: Wide BOS; decreased cadence  DYNAMIC GAIT INDEX: 3/6: 18/24  MINI-BESTest: Balance Evaluation Systems Test ANTICIPATORY: SIT TO STAND: (2) normal without use of hands and stabilizes independently     (1) Moderate comes to stand WITH use of hands on 1st attempt    (0) Severe: unable to stand up from chair without assistance OR needs several attempts with use of   hands Score: 2  2. RISE TO TOES: feet shoulder width apart. Hands on hips.  Rise as high as you can onto your toes. Try to hold this pose for 3 sec                          (2) stable for 3s with maximum height    (1) heels up, but not full range (smaller than when holding hands) OR noticeable instability for 3 sec                            (0) < or equat to 3 s  Score: 1  REACTIVE POSTURAL CONTROL 4. COMPENSATORY STEPPING CORRECTION FORWARD stand with feet shoulder width apart, arms at your sides. Lean forward against my hands beyond your forward limits.  When I let go, do whatever is necessary, including taking a step, to avoid a fall   (2) recovers independently  with a single, large step, to avoid a fall   (1) more than one step used to recover equilibrium   (0) No step OR would fall if not caught Score: 1  5. COMPENSATORY STEPPING CORRECTION BACKWARD: Lean backward against my hands beyond your backward limits.  When I let go, do whatever is necessary, including a step to avoid a fall. (2) recovers independently  with a single, large step, to avoid a fall   (1) more than one step used to recover equilibrium   (0) No step OR would fall if not caught Score: 1  6. COMPENSATORY STEPPING LATERAL: Lean into my hand beyond your sideways limit.  When I let go, do whatever is necessary, including taking a step, to avoid a fall. Left           Right   (2) recovers independently with 1 step (crossover or lateral OK)   (1) several steps to recover equilibrium   (0) falls or cannot step Score: 1   SENSORY ORIENTATION 7. STANCE FEET TOGETHER EYES OPEN  firm surface Be as stable and still as possible until I say stop   (2) 30s   (1) < 30 s   (0) unable Score: 2  8. STANCE ON FOAM EYES CLOSED feet together, eyes closed, hands on hips. Be as stable and still as you can until I stay stop.  I will start timing when you close your eyes. Time in seconds:      (2) 30s   (1) <30 s   (0) unable Score: 1  9. INCLINE EYES CLOSED: Stand on incline with feet shoulder width apart, arms down at your sides.  I will start timing when you close your eyes   (2) Stands 30 s and aligns with gravity   (1) stands < 30 s OR aligns with surface   (0) unable Score: 1  DYNAMIC GAIT 10. CHANGE IN GAIT SPEED: begin walking at your normal  speed, when I tell you "fast", walk as fast as you can, when I say "slow" walk very slowly   (2) significantly changes walking speed without imbalance   (1) unable to change walking speed or signs of imbalance   (0) unable to achieve significant change in walking speed AND signes of imbalance Score: 1  11. WALK WITH HEAD TURNS HORIZONTAL: begin walking at normal speed, when I say right, turn your head to the right, when I say left turn your head to the left.  Try to keep yourself walking in a straight line   (2) performs head turns with no change in gait speed and good balance   (1) performs head turns with reduction in gait speed   (0)  performs head turns with imbalance Score: 1  12. WALK WITH PIVOT TURNS: begin walking at your normal speed.  When I tell you to "turn and stop", turn as quickly as you can, face the opposite direction and stop.  After the turn, your feet should be close together   (2) turns with feet close FAST in 3 steps with good balance   (1) turns with feet close SLOW > 4 steps with good balance   (0) cannot turn with feet close at any speed without imbalance Score: 1  13. STEP OVER OBSTACLES:  ( 9 inch height 10 feet away from subject) begin walking at your normal speed.  When you get to the box, step over it, not around it and keep walking   (2) Able to step over box with minimal change of gait speed and good balance   (1) Steps over box but touches box OR displays cautious behavior by slowing gait   (0) Unable to step over box OR steps around box Score: 1  TREATMENT DATE:  05/17/23: Nustep L5 x 8 min UEs/LEs Staggered heel/toe raise 2x10 Gastroc stretch x30" Soleus stretch x30" PWR!rock 2x10 (one hand support) PWR!step side 2x10 (one hand support), PWR!step back 2x10 (bilat UE support), PWR!step fwd 2x10 (no UE support) Standing feet  together on airex EO head rotations 2x30 sec; head nods 2x30 sec   05/15/23: Nustep L5 x 6 min UEs/LEs Standing heel/toe raise 2x10 PWR!up 2x10 PWR!rock 2x10 PWR!step 2x10 Standing feet together on airex EO 2x30 sec Standing feet together on airex EO, head rotations 2x30 sec; head nods 2x30 sec Standing feet apart on airex EC 2x30 sec Side step up and over pink airex 2x10  05/10/2023: Nu-Step L3 6 min while discussing status and plan DGI as above Some components of Mini-Best test as above Sit to stand hi-low table (just (right knee pain) RPE: 1/10 Heel raises bil 5x, staggered stance 5x each way Holding to counter reverse lunge 5x right/left 2 sets Leg press seat 8 80# bil 15x; 40# single leg press 10x right/left  RPE 3/10   05/08/2023 Initial Evaluation & HEP created    PATIENT EDUCATION:  Education details: POC; HEP compliance; HEP Person educated: Patient Education method: Explanation, Demonstration, and Handouts Education comprehension: verbalized understanding, returned demonstration, and needs further education  HOME EXERCISE PROGRAM: Access Code: [email or web address] URL: [email or web address] Date: 05/08/2023 Prepared by: Claude Manges  Exercises - Sit to Stand with Armchair  - 1 x daily - 7 x weekly - 1 sets - 10 reps - Standing Hip Extension with Counter Support  - 1 x daily - 7 x weekly - 2 sets - 10 reps - Standing Hip Abduction with Counter Support  - 1 x daily - 7 x weekly - 2 sets - 10 reps - Heel Raises with Counter Support  - 1 x daily - 7 x weekly - 2 sets - 10 reps - Single Leg Stance with Support  - 1 x daily - 7 x weekly - 1 sets - 30 hold  ASSESSMENT:  CLINICAL IMPRESSION: Session focused bilat LE strength, endurance, dynamic balance and weight shifting. Pt requires CGA for safety. Improved stability with foam exercises. Able to progress to eyes closed exercises.  OBJECTIVE IMPAIRMENTS: Abnormal gait, decreased activity tolerance, decreased  balance, decreased endurance, difficulty walking, decreased strength, and postural dysfunction.     GOALS: Goals reviewed with patient? Yes  SHORT TERM GOALS: Target date: 06/05/2023  Patient will be independent with initial HEP. Baseline:  Goal status: INITIAL  2.  Patient will report > or = to 30% improvement in overall balance confidence since starting PT. Baseline:  Goal status: INITIAL  3.  Patient will be able to complete sit to stand without UE support for improved functional strength. Baseline:  Goal status: INITIAL    LONG TERM GOALS: Target date: 07/03/2023  Patient will demonstrate independence in advanced HEP. Baseline:  Goal status: INITIAL  2.  Patient will report > or = to 60% improvement in balance confidence since starting PT. Baseline:  Goal status: INITIAL   3.  Patient will score < or = to 12 sec on 5STS for improved functional strength. Baseline: 15.12 sec Goal status: INITIAL  4.  Patient will ambulate > or = to 682ft on for improved community negotiation.  Baseline:  Goal status: INITIAL  5.  Patient will be able to ascend / descend a curb for improved community negotiation. Baseline:  Goal status: INITIAL     PLAN:  PT FREQUENCY: 2x/week  PT DURATION: 8 weeks  PLANNED INTERVENTIONS: 97164- PT Re-evaluation, 97110-Therapeutic exercises, 97530- Therapeutic activity, 97112- Neuromuscular re-education, 97535- Self Care, 16109- Manual therapy, [phone]- Gait training, [phone]- Canalith repositioning, [id number]- Aquatic Therapy, 97016- Vasopneumatic device, [id number]- Traction (mechanical), [phone]- Ionotophoresis 4mg /ml Dexamethasone, Balance training, Stair training, Taping, Dry Needling, Joint mobilization, Joint manipulation, Spinal manipulation, Spinal mobilization, Vestibular training, Cryotherapy, and Moist heat  PLAN FOR NEXT SESSION:  leg strengthening (leg press/ step ups/staggered STS); dynamic balance  Angelette Ganus April Dell Ponto, PT 05/17/23  11:47 AM Phone: [phone] Fax: [phone]

## 2023-05-17 NOTE — Telephone Encounter (Signed)
 Call placed to patient to discuss upcoming procedure.   CT: completed.  Labs: BMP completed. Advised patient he will need an updated CBC drawn by 3/17.   Any recent signs of acute illness or been started on antibiotics? No Any new medications started? No Any medications to hold? No Any missed doses of blood thinner?  No Advised patient to continue taking ANTICOAGULANT: Eliquis (Apixaban) without missing any doses.  Medication instructions:  On the morning of your procedure DO NOT take any medication., including Eliquis or the procedure may be rescheduled. Nothing to eat or drink after midnight prior to your procedure.  Confirmed patient is scheduled for Atrial Fibrillation Ablation on Thursday, March 20 with Dr. York Pellant. Instructed patient to arrive at the Main Entrance A at [facility]: [address] [place], [place] [address] and check in at Admitting at 5:30 AM  Advised of plan to go home the same day and will only stay overnight if medically necessary. You MUST have a responsible adult to drive you home and MUST be with you the first 24 hours after you arrive home or your procedure could be cancelled.  Patient verbalized understanding to all instructions provided and agreed to proceed with procedure.

## 2023-05-21 DIAGNOSIS — Z01810 Encounter for preprocedural cardiovascular examination: Secondary | ICD-10-CM | POA: Diagnosis not present

## 2023-05-21 LAB — CBC

## 2023-05-22 ENCOUNTER — Ambulatory Visit: Admitting: Physical Therapy

## 2023-05-22 DIAGNOSIS — R293 Abnormal posture: Secondary | ICD-10-CM | POA: Diagnosis not present

## 2023-05-22 DIAGNOSIS — R2681 Unsteadiness on feet: Secondary | ICD-10-CM | POA: Diagnosis not present

## 2023-05-22 DIAGNOSIS — Z09 Encounter for follow-up examination after completed treatment for conditions other than malignant neoplasm: Secondary | ICD-10-CM | POA: Diagnosis not present

## 2023-05-22 DIAGNOSIS — R2689 Other abnormalities of gait and mobility: Secondary | ICD-10-CM

## 2023-05-22 DIAGNOSIS — M6281 Muscle weakness (generalized): Secondary | ICD-10-CM

## 2023-05-22 DIAGNOSIS — R5381 Other malaise: Secondary | ICD-10-CM

## 2023-05-22 LAB — CBC
Hematocrit: 41.5 % (ref 37.5–51.0)
Hemoglobin: 13.7 g/dL (ref 13.0–17.7)
MCH: 28.7 pg (ref 26.6–33.0)
MCHC: 33 g/dL (ref 31.5–35.7)
MCV: 87 fL (ref 79–97)
Platelets: 213 10*3/uL (ref 150–450)
RBC: 4.78 x10E6/uL (ref 4.14–5.80)
RDW: 15.7 % — ABNORMAL HIGH (ref 11.6–15.4)
WBC: 5.1 10*3/uL (ref 3.4–10.8)

## 2023-05-22 LAB — BASIC METABOLIC PANEL
BUN/Creatinine Ratio: 12 (ref 10–24)
BUN: 10 mg/dL (ref 8–27)
CO2: 28 mmol/L (ref 20–29)
Calcium: 9.5 mg/dL (ref 8.6–10.2)
Chloride: 100 mmol/L (ref 96–106)
Creatinine, Ser: 0.81 mg/dL (ref 0.76–1.27)
Glucose: 96 mg/dL (ref 70–99)
Potassium: 3.3 mmol/L — ABNORMAL LOW (ref 3.5–5.2)
Sodium: 140 mmol/L (ref 134–144)
eGFR: 94 mL/min/{1.73_m2} (ref >59)

## 2023-05-22 NOTE — Therapy (Signed)
 OUTPATIENT PHYSICAL THERAPY TREATMENT   Patient Name: James Ibarra MRN: [id number] DOB:12-Sep-1950, 73 y.o., male Today's Date: 05/22/2023  END OF SESSION:  PT End of Session - 05/22/23 1146     Visit Number 5    Date for PT Re-Evaluation 07/03/23    Authorization Type [employer] MCR (auth requested)    Progress Note Due on Visit 10    PT Start Time 1146    PT Stop Time 1225    PT Time Calculation (min) 39 min    Activity Tolerance Patient tolerated treatment well               Past Medical History:  Diagnosis Date   Cancer (HCC)    Skin Cancer - Scalp, Right neck, Left Hand   Dysrhythmia    A. Fib while hospitalized June 2024   Heart murmur    History of nonmelanoma skin cancer    Hypercholesteremia    Hypertension    Pneumonia 08/2022   Trigeminal neuralgia    Past Surgical History:  Procedure Laterality Date   CATARACT EXTRACTION W/ INTRAOCULAR LENS IMPLANT Bilateral    CLIPPING OF ATRIAL APPENDAGE Left 12/08/2022   Procedure: CLIPPING OF ATRIAL APPENDAGE (LAA) USING 50 MM ATRICLIP;  Surgeon: Eugenio Hoes, MD;  Location: [facility] OR;  Service: Open Heart Surgery;  Laterality: Left;   INGUINAL HERNIA REPAIR  11/07/2011   Procedure: LAPAROSCOPIC INGUINAL HERNIA;  Surgeon: Atilano Ina, MD,FACS;  Location: [facility] ORS;  Service: General;  Laterality: Left;   MAZE N/A 12/08/2022   Procedure: MAZE;  Surgeon: Eugenio Hoes, MD;  Location: [facility] OR;  Service: Open Heart Surgery;  Laterality: N/A;   MITRAL VALVE REPAIR N/A 12/08/2022   Procedure: MITRAL VALVE REPAIR (MVR) USING 34 MM SIMULUS SEMI- RIGID ANNULOPLASTY BAND;  Surgeon: Eugenio Hoes, MD;  Location: [facility] OR;  Service: Open Heart Surgery;  Laterality: N/A;   RETINAL DETACHMENT SURGERY  03/07/1995   RIGHT/LEFT HEART CATH AND CORONARY ANGIOGRAPHY N/A 10/12/2022   Procedure: RIGHT/LEFT HEART CATH AND CORONARY ANGIOGRAPHY;  Surgeon: [name], MD;  Location: [facility] INVASIVE CV LAB;  Service: Cardiovascular;  Laterality: N/A;    SKIN CANCER EXCISION  2012, 1999   basal cell - neck and hand   TEE WITHOUT CARDIOVERSION N/A 10/16/2022   Procedure: TRANSESOPHAGEAL ECHOCARDIOGRAM;  Surgeon: Christell Constant, MD;  Location: [facility] INVASIVE CV LAB;  Service: Cardiovascular;  Laterality: N/A;   TEE WITHOUT CARDIOVERSION N/A 12/08/2022   Procedure: TRANSESOPHAGEAL ECHOCARDIOGRAM;  Surgeon: Eugenio Hoes, MD;  Location: [facility] OR;  Service: Open Heart Surgery;  Laterality: N/A;   Patient Active Problem List   Diagnosis Date Noted   S/P MVR (mitral valve repair) 12/08/2022   Protein-calorie malnutrition, severe 08/19/2022   Alcohol withdrawal (HCC) 08/14/2022   Acute metabolic encephalopathy 08/14/2022   Paroxysmal atrial fibrillation with RVR (HCC) 08/14/2022   Nonrheumatic mitral valve regurgitation 08/14/2022   Mitral valve prolapse 08/14/2022   PNA (pneumonia) 08/12/2022   Severe sepsis (HCC) 08/12/2022   HLD (hyperlipidemia) 08/12/2022   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   Trigeminal neuralgia of left side of face 07/15/2019   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 Will be getting his ablation on Thurs.  PERTINENT HISTORY: Skin cancer; HTN ; open heart surgery 2023  PAIN:  Are you having pain? No  PRECAUTIONS: None  WEIGHT BEARING RESTRICTIONS: No  FALLS:  Has patient fallen in last 6 months? No  LIVING  ENVIRONMENT: Lives with: lives with their spouse Lives in: House/apartment Stairs: Yes: Internal: down to basement but rarely uses them steps; bilateral but cannot reach both and External: 8 steps; can reach both Has following equipment at home: Dan Humphreys - 2 wheeled  OCCUPATION: Retired; Environmental health practitioner in pharmacology   PLOF: Independent, Independent with basic ADLs, Independent with household mobility without device, Independent with community mobility without device, Independent with gait, and Independent with transfers  PATIENT GOALS: Strengthening his legs and working on balance  NEXT MD VISIT: PRN  OBJECTIVE:  Note: Objective measures were completed at Evaluation unless otherwise noted.  PATIENT SURVEYS:  ABC scale 71% moderate level of physical functioning  POSTURE: rounded shoulders and forward head   LOWER EXTREMITY MMT: Grossly 4/5    FUNCTIONAL TESTS:  5 times sit to stand: 15.12 sec with UE support Timed up and go (TUG): 9.49sec 3 minute walk test: 561 RPE:0-1  SL Balance Rt 12 sec  Lt 8 sec with finger touch on counter; < 2 sec with no UE support  GAIT: Distance walked: 561FT Assistive device utilized: None Level of assistance: Complete Independence Comments: Wide BOS; decreased cadence  DYNAMIC GAIT INDEX: 3/6: 18/24  MINI-BESTest: Balance Evaluation Systems Test ANTICIPATORY: SIT TO STAND: (2) normal without use of hands and stabilizes independently     (1) Moderate comes to stand WITH use of hands on 1st attempt    (0) Severe: unable to stand up from chair without assistance OR needs several attempts with use of   hands Score: 2  2. RISE TO TOES: feet shoulder width apart. Hands on hips.  Rise as high as you can onto your toes. Try to hold this pose for 3 sec                          (2) stable for 3s with maximum height    (1) heels up, but not full range (smaller than when holding hands) OR noticeable instability for 3 sec                            (0) < or equat to 3 s  Score: 1  REACTIVE POSTURAL CONTROL 4. COMPENSATORY STEPPING CORRECTION FORWARD stand with feet shoulder width apart, arms at your sides. Lean forward against my hands beyond your forward limits.  When I let go, do whatever is necessary, including taking a step, to avoid a fall   (2) recovers independently  with a single, large step, to avoid a fall   (1) more than one step used to recover equilibrium   (0) No step OR would fall if not caught Score: 1  5. COMPENSATORY STEPPING CORRECTION BACKWARD: Lean backward against my hands beyond your backward limits.  When I let go, do whatever is necessary, including a step to avoid a fall. (2) recovers independently  with a single, large  step, to avoid a fall   (1) more than one step used to recover equilibrium   (0) No step OR would fall if not caught Score: 1  6. COMPENSATORY STEPPING LATERAL: Lean into my hand beyond your sideways limit.  When I let go, do whatever is necessary, including taking a step, to avoid a fall. Left           Right   (2) recovers independently with 1 step (crossover or lateral OK)   (1) several steps to recover equilibrium   (0) falls or cannot step Score: 1   SENSORY ORIENTATION 7. STANCE FEET TOGETHER EYES OPEN  firm surface Be as stable and still as possible until I say stop   (2) 30s   (1) < 30 s   (0) unable Score: 2  8. STANCE ON FOAM EYES CLOSED feet together, eyes closed, hands on hips. Be as stable and still as you can until I stay stop.  I will start timing when you close your eyes. Time in seconds:      (2) 30s   (1) <30 s   (0) unable Score: 1  9. INCLINE EYES CLOSED: Stand on incline with feet shoulder width apart, arms down at your sides.  I will start timing when you close your eyes   (2) Stands 30 s and aligns with gravity   (1) stands < 30 s OR aligns with surface   (0) unable Score: 1  DYNAMIC GAIT 10. CHANGE IN GAIT SPEED: begin walking at your normal speed,  when I tell you "fast", walk as fast as you can, when I say "slow" walk very slowly   (2) significantly changes walking speed without imbalance   (1) unable to change walking speed or signs of imbalance   (0) unable to achieve significant change in walking speed AND signes of imbalance Score: 1  11. WALK WITH HEAD TURNS HORIZONTAL: begin walking at normal speed, when I say right, turn your head to the right, when I say left turn your head to the left.  Try to keep yourself walking in a straight line   (2) performs head turns with no change in gait speed and good balance   (1) performs head turns with reduction in gait speed   (0)  performs head turns with imbalance Score: 1  12. WALK WITH PIVOT TURNS: begin walking at your normal speed.  When I tell you to "turn and stop", turn as quickly as you can, face the opposite direction and stop.  After the turn, your feet should be close together   (2) turns with feet close FAST in 3 steps with good balance   (1) turns with feet close SLOW > 4 steps with good balance   (0) cannot turn with feet close at any speed without imbalance Score: 1  13. STEP OVER OBSTACLES:  ( 9 inch height 10 feet away from subject) begin walking at your normal speed.  When you get to the box, step over it, not around it and keep walking   (2) Able to step over box with minimal change of gait speed and good balance   (1) Steps over box but touches box OR displays cautious behavior by slowing gait   (0) Unable to step over box OR steps around box Score: 1  TREATMENT DATE:  05/22/23: Nustep L5 x 8 min UEs/LEs Staggered heel/toe raise 2x10 Counter hip ext 2x10 PWR!rock 2x10 PWR!step side 2x10  Side step on foam balance beam x 5 laps Tandem walking on foam balance beam x 5 laps Standing feet together on foam beam Head turns and then head nods  x10 each   05/17/23: Nustep L5 x 8 min UEs/LEs Staggered heel/toe raise 2x10 Gastroc stretch x30" Soleus stretch x30" PWR!rock 2x10 (one hand support) PWR!step side 2x10 (one hand support), PWR!step back 2x10 (bilat UE support), PWR!step fwd 2x10 (no UE support) Standing feet together on airex EO head rotations 2x30 sec; head nods 2x30 sec   05/15/23: Nustep L5 x 6 min UEs/LEs Standing heel/toe raise 2x10 PWR!up 2x10 PWR!rock 2x10 PWR!step 2x10 Standing feet together on airex EO 2x30 sec Standing feet together on airex EO, head rotations 2x30 sec; head nods 2x30 sec Standing feet apart on airex EC 2x30 sec Side step up and over pink airex 2x10  05/10/2023: Nu-Step L3 6 min while discussing status and plan DGI as above Some components of Mini-Best test as above Sit to stand hi-low table (just (right knee pain) RPE: 1/10 Heel raises bil 5x, staggered stance 5x each way Holding to counter reverse lunge 5x right/left 2 sets Leg press seat 8 80# bil 15x; 40# single leg press 10x right/left  RPE 3/10   PATIENT EDUCATION:  Education details: POC; HEP compliance; HEP Person educated: Patient Education method: Explanation, Demonstration, and Handouts Education comprehension: verbalized understanding, returned demonstration, and needs further education  HOME EXERCISE PROGRAM: Access Code: [email or web address] URL: [email or web address] Date: 05/08/2023 Prepared by: Claude Manges  Exercises - Sit to Stand with Armchair  - 1 x daily - 7 x weekly - 1 sets - 10 reps - Standing Hip Extension with Counter Support  - 1 x daily - 7 x weekly - 2 sets - 10 reps - Standing Hip Abduction with Counter Support  - 1 x daily - 7 x weekly - 2 sets - 10 reps - Heel Raises with Counter Support  - 1 x daily - 7 x weekly - 2 sets - 10 reps - Single Leg Stance with Support  - 1 x daily - 7 x weekly - 1 sets - 30 hold  ASSESSMENT:  CLINICAL IMPRESSION: Continued working on improving weight  shifting, strength, stability and dynamic balance. Improving side step lunging with less LOBs. Able to do more with less UE support.   OBJECTIVE IMPAIRMENTS: Abnormal gait, decreased activity tolerance, decreased balance, decreased endurance, difficulty walking, decreased strength, and postural dysfunction.     GOALS: Goals reviewed with patient? Yes  SHORT TERM GOALS: Target date: 06/05/2023  Patient will be independent with initial HEP. Baseline:  Goal status: INITIAL  2.  Patient will report > or = to 30% improvement in overall balance confidence since starting PT. Baseline:  Goal status: INITIAL  3.  Patient will be able to complete sit to stand without UE support for improved functional strength. Baseline:  Goal status: INITIAL    LONG TERM GOALS: Target date: 07/03/2023  Patient will demonstrate independence in advanced HEP. Baseline:  Goal status: INITIAL  2.  Patient will report > or = to 60% improvement in balance confidence since starting PT. Baseline:  Goal status: INITIAL   3.  Patient will score < or = to 12 sec on 5STS for improved functional strength. Baseline: 15.12 sec Goal status: INITIAL  4.  Patient will ambulate > or = to  657ft on for improved community negotiation.  Baseline:  Goal status: INITIAL  5.  Patient will be able to ascend / descend a curb for improved community negotiation. Baseline:  Goal status: INITIAL     PLAN:  PT FREQUENCY: 2x/week  PT DURATION: 8 weeks  PLANNED INTERVENTIONS: 97164- PT Re-evaluation, 97110-Therapeutic exercises, 97530- Therapeutic activity, 97112- Neuromuscular re-education, 97535- Self Care, 16109- Manual therapy, [phone]- Gait training, [phone]- Canalith repositioning, [id number]- Aquatic Therapy, 97016- Vasopneumatic device, [id number]- Traction (mechanical), [id number]- Ionotophoresis 4mg /ml Dexamethasone, Balance training, Stair training, Taping, Dry Needling, Joint mobilization, Joint manipulation, Spinal  manipulation, Spinal mobilization, Vestibular training, Cryotherapy, and Moist heat  PLAN FOR NEXT SESSION:  leg strengthening (leg press/ step ups/staggered STS); dynamic balance  Dimetri Armitage April Dell Ponto, PT 05/22/23 12:22 PM Phone: [phone] Fax: [phone]

## 2023-05-23 ENCOUNTER — Telehealth (HOSPITAL_COMMUNITY): Payer: Self-pay

## 2023-05-23 MED ORDER — POTASSIUM CHLORIDE ER 10 MEQ PO TBCR: 10.0000 meq | EXTENDED_RELEASE_TABLET | Freq: Every day | ORAL | 0 refills | Status: DC

## 2023-05-23 NOTE — Anesthesia Preprocedure Evaluation (Signed)
 Anesthesia Evaluation  Patient identified by MRN, date of birth, ID band Patient awake    Reviewed: Allergy & Precautions, H&P , NPO status , Patient's Chart, lab work & pertinent test results  Airway Mallampati: II  TM Distance: >3 FB Neck ROM: Full    Dental no notable dental hx.    Pulmonary pneumonia   Pulmonary exam normal breath sounds clear to auscultation       Cardiovascular hypertension, Normal cardiovascular exam+ dysrhythmias Atrial Fibrillation MVP  Rhythm:Regular Rate:Normal  S/p MVr   Neuro/Psych Hx of trigeminal neuralgia  negative psych ROS   GI/Hepatic negative GI ROS, Neg liver ROS,,,Hx of alcohol withdrawal   Endo/Other  negative endocrine ROS    Renal/GU negative Renal ROS  negative genitourinary   Musculoskeletal negative musculoskeletal ROS (+)    Abdominal   Peds negative pediatric ROS (+)  Hematology negative hematology ROS (+)   Anesthesia Other Findings   Reproductive/Obstetrics negative OB ROS                              Anesthesia Physical Anesthesia Plan  ASA: 3  Anesthesia Plan: General   Post-op Pain Management:    Induction: Intravenous  PONV Risk Score and Plan: 2 and Ondansetron and Dexamethasone  Airway Management Planned: Oral ETT  Additional Equipment: None  Intra-op Plan:   Post-operative Plan: Extubation in OR  Informed Consent: I have reviewed the patients History and Physical, chart, labs and discussed the procedure including the risks, benefits and alternatives for the proposed anesthesia with the patient or authorized representative who has indicated his/her understanding and acceptance.     Dental advisory given  Plan Discussed with: CRNA  Anesthesia Plan Comments:          Anesthesia Quick Evaluation

## 2023-05-23 NOTE — Pre-Procedure Instructions (Signed)
 Instructed patient on the following items: Arrival time 0515 Nothing to eat or drink after midnight No meds AM of procedure Responsible person to drive you home and stay with you for 24 hrs  Have you missed any doses of anti-coagulant Elqiuis- takes twice a day, hasn't missed any doses.  Don't take morning of procedure.

## 2023-05-23 NOTE — Telephone Encounter (Signed)
 Received a VM from patient stating he received the previous information provided and had also noticed the potassium level via MyChart. Reports he took an unknown strength of potassium supplement on yesterday, that he already had at home. He plans to pick up the new prescription on today.

## 2023-05-23 NOTE — Telephone Encounter (Signed)
 Per Dr. Morrie Sheldon result note on 05/21/23, preprocedure labs are stable and within acceptable range to proceed. Will need to start potassium 10 mEq daily and have potassium recheck the day of procedure.   Attempted to reach patient, no answer. DPR on file/Left detailed message regarding lab results and provider recommendations. Will send Rx to local pharmacy and place order for a repeat BMP on day of procedure.

## 2023-05-24 ENCOUNTER — Other Ambulatory Visit: Payer: Self-pay

## 2023-05-24 ENCOUNTER — Ambulatory Visit (HOSPITAL_COMMUNITY): Admitting: Certified Registered Nurse Anesthetist

## 2023-05-24 ENCOUNTER — Encounter (HOSPITAL_COMMUNITY)
Admission: RE | Disposition: A | Discharge status: Home / Self Care | Payer: Self-pay | Source: Home / Self Care | Attending: Cardiovascular Disease

## 2023-05-24 ENCOUNTER — Ambulatory Visit (HOSPITAL_BASED_OUTPATIENT_CLINIC_OR_DEPARTMENT_OTHER): Admitting: Certified Registered Nurse Anesthetist

## 2023-05-24 ENCOUNTER — Ambulatory Visit (HOSPITAL_COMMUNITY)
Admission: RE | Admit: 2023-05-24 | Discharge: 2023-05-24 | Disposition: A | Discharge status: Home / Self Care | Payer: Medicare Other | Attending: Cardiovascular Disease | Admitting: Cardiovascular Disease

## 2023-05-24 DIAGNOSIS — E785 Hyperlipidemia, unspecified: Secondary | ICD-10-CM | POA: Diagnosis not present

## 2023-05-24 DIAGNOSIS — Z7901 Long term (current) use of anticoagulants: Secondary | ICD-10-CM | POA: Insufficient documentation

## 2023-05-24 DIAGNOSIS — I480 Paroxysmal atrial fibrillation: Secondary | ICD-10-CM | POA: Diagnosis not present

## 2023-05-24 DIAGNOSIS — I10 Essential (primary) hypertension: Secondary | ICD-10-CM

## 2023-05-24 DIAGNOSIS — I4891 Unspecified atrial fibrillation: Secondary | ICD-10-CM | POA: Diagnosis not present

## 2023-05-24 DIAGNOSIS — E876 Hypokalemia: Secondary | ICD-10-CM

## 2023-05-24 HISTORY — PX: ATRIAL FIBRILLATION ABLATION: EP1191

## 2023-05-24 LAB — BASIC METABOLIC PANEL
Anion gap: 9 (ref 5–15)
BUN: 13 mg/dL (ref 8–23)
CO2: 27 mmol/L (ref 22–32)
Calcium: 9.2 mg/dL (ref 8.9–10.3)
Chloride: 105 mmol/L (ref 98–111)
Creatinine, Ser: 0.87 mg/dL (ref 0.61–1.24)
GFR, Estimated: >60 mL/min (ref >60)
Glucose, Bld: 94 mg/dL (ref 70–99)
Potassium: 3.8 mmol/L (ref 3.5–5.1)
Sodium: 141 mmol/L (ref 135–145)

## 2023-05-24 LAB — POCT I-STAT, CHEM 8
BUN: 14 mg/dL (ref 8–23)
Calcium, Ion: 1.18 mmol/L (ref 1.15–1.40)
Chloride: 104 mmol/L (ref 98–111)
Creatinine, Ser: 1 mg/dL (ref 0.61–1.24)
Glucose, Bld: 99 mg/dL (ref 70–99)
HCT: 37 % — ABNORMAL LOW (ref 39.0–52.0)
Hemoglobin: 12.6 g/dL — ABNORMAL LOW (ref 13.0–17.0)
Potassium: 3.8 mmol/L (ref 3.5–5.1)
Sodium: 142 mmol/L (ref 135–145)
TCO2: 28 mmol/L (ref 22–32)

## 2023-05-24 LAB — POCT ACTIVATED CLOTTING TIME: Activated Clotting Time: 268 s

## 2023-05-24 SURGERY — ATRIAL FIBRILLATION ABLATION
Anesthesia: General

## 2023-05-24 MED ORDER — PROPOFOL 10 MG/ML IV BOLUS
INTRAVENOUS | Status: DC | PRN
  Administered 2023-05-24: 100 mg via INTRAVENOUS
  Administered 2023-05-24: 30 mg via INTRAVENOUS

## 2023-05-24 MED ORDER — SUGAMMADEX SODIUM 200 MG/2ML IV SOLN
INTRAVENOUS | Status: DC | PRN
  Administered 2023-05-24: 150 mg via INTRAVENOUS

## 2023-05-24 MED ORDER — LIDOCAINE 2% (20 MG/ML) 5 ML SYRINGE
INTRAMUSCULAR | Status: DC | PRN
  Administered 2023-05-24: 100 mg via INTRAVENOUS

## 2023-05-24 MED ORDER — ONDANSETRON HCL 4 MG/2ML IJ SOLN: 4.0000 mg | Freq: Four times a day (QID) | INTRAMUSCULAR | Status: DC | PRN

## 2023-05-24 MED ORDER — HEPARIN SODIUM (PORCINE) 1000 UNIT/ML IJ SOLN
INTRAMUSCULAR | Status: DC | PRN
  Administered 2023-05-24: 3000 [IU] via INTRAVENOUS
  Administered 2023-05-24: 13000 [IU] via INTRAVENOUS

## 2023-05-24 MED ORDER — ONDANSETRON HCL 4 MG/2ML IJ SOLN
INTRAMUSCULAR | Status: DC | PRN
  Administered 2023-05-24: 4 mg via INTRAVENOUS

## 2023-05-24 MED ORDER — HEPARIN (PORCINE) IN NACL 1000-0.9 UT/500ML-% IV SOLN
INTRAVENOUS | Status: DC | PRN
  Administered 2023-05-24 (×2): 500 mL

## 2023-05-24 MED ORDER — ACETAMINOPHEN 325 MG PO TABS: 650.0000 mg | ORAL_TABLET | ORAL | Status: DC | PRN

## 2023-05-24 MED ORDER — LACTATED RINGERS IV SOLN: INTRAVENOUS | Status: DC | PRN

## 2023-05-24 MED ORDER — DEXAMETHASONE SODIUM PHOSPHATE 10 MG/ML IJ SOLN
INTRAMUSCULAR | Status: DC | PRN
  Administered 2023-05-24: 4 mg via INTRAVENOUS

## 2023-05-24 MED ORDER — PROTAMINE SULFATE 10 MG/ML IV SOLN
INTRAVENOUS | Status: DC | PRN
  Administered 2023-05-24: 5 mg via INTRAVENOUS
  Administered 2023-05-24: 45 mg via INTRAVENOUS

## 2023-05-24 MED ORDER — FENTANYL CITRATE (PF) 100 MCG/2ML IJ SOLN
INTRAMUSCULAR | Status: AC
  Filled 2023-05-24: qty 2

## 2023-05-24 MED ORDER — SODIUM CHLORIDE 0.9 % IV SOLN: 250.0000 mL | INTRAVENOUS | Status: DC | PRN

## 2023-05-24 MED ORDER — PHENYLEPHRINE HCL-NACL 20-0.9 MG/250ML-% IV SOLN
INTRAVENOUS | Status: DC | PRN
  Administered 2023-05-24: 40 ug/min via INTRAVENOUS

## 2023-05-24 MED ORDER — FENTANYL CITRATE (PF) 250 MCG/5ML IJ SOLN
INTRAMUSCULAR | Status: DC | PRN
  Administered 2023-05-24 (×2): 50 ug via INTRAVENOUS

## 2023-05-24 MED ORDER — SODIUM CHLORIDE 0.9 % IV SOLN: INTRAVENOUS | Status: DC

## 2023-05-24 MED ORDER — ROCURONIUM BROMIDE 10 MG/ML (PF) SYRINGE
PREFILLED_SYRINGE | INTRAVENOUS | Status: DC | PRN
  Administered 2023-05-24: 60 mg via INTRAVENOUS
  Administered 2023-05-24: 10 mg via INTRAVENOUS

## 2023-05-24 MED ORDER — SODIUM CHLORIDE 0.9% FLUSH: 3.0000 mL | INTRAVENOUS | Status: DC | PRN

## 2023-05-24 MED ORDER — ATROPINE SULFATE 1 MG/10ML IJ SOSY
PREFILLED_SYRINGE | INTRAMUSCULAR | Status: DC | PRN
  Administered 2023-05-24: 1 mg via INTRAVENOUS

## 2023-05-24 SURGICAL SUPPLY — 24 items
BAG SNAP BAND KOVER 36X36 (MISCELLANEOUS) IMPLANT
BLANKET WARM UNDERBOD FULL ACC (MISCELLANEOUS) ×1 IMPLANT
CABLE PFA RX CATH CONN (CABLE) IMPLANT
CATH 8FR ACUNAV REPROCESSED (CATHETERS) IMPLANT
CATH 8FR REPROCESSED SOUNDSTAR (CATHETERS) ×1 IMPLANT
CATH 8FR SOUNDSTAR REPROCESSED (CATHETERS) IMPLANT
CATH FARAWAVE ABLATION 31 (CATHETERS) IMPLANT
CATH OCTARAY 2.0 F 3-3-3-3-3 (CATHETERS) IMPLANT
CATH REPROCESSED 8FR ACUNAV (CATHETERS) ×1 IMPLANT
CATH WEB BI DIR CSDF CRV REPRO (CATHETERS) IMPLANT
CLOSURE PERCLOSE PROSTYLE (VASCULAR PRODUCTS) IMPLANT
COVER SWIFTLINK CONNECTOR (BAG) ×1 IMPLANT
DEVICE CLOSURE MYNXGRIP 6/7F (Vascular Products) IMPLANT
DILATOR VESSEL 38 20CM 16FR (INTRODUCER) IMPLANT
GUIDEWIRE INQWIRE 1.5J.035X260 (WIRE) IMPLANT
INQWIRE 1.5J .035X260CM (WIRE) ×1 IMPLANT
KIT VERSACROSS CNCT FARADRIVE (KITS) IMPLANT
PACK EP LF (CUSTOM PROCEDURE TRAY) ×1 IMPLANT
PAD DEFIB RADIO PHYSIO CONN (PAD) ×1 IMPLANT
PATCH CARTO3 (PAD) IMPLANT
SHEATH FARADRIVE STEERABLE (SHEATH) IMPLANT
SHEATH PINNACLE 8F 10CM (SHEATH) IMPLANT
SHEATH PINNACLE 9F 10CM (SHEATH) IMPLANT
SHEATH PROBE COVER 6X72 (BAG) IMPLANT

## 2023-05-24 NOTE — H&P (Signed)
 Electrophysiology Office Note:    Date:  05/24/2023   ID:  James Ibarra, DOB 11-Aug-1950, MRN [id number]  PCP:  James Has, MD   [facility] Providers Cardiologist:  [name], MD Electrophysiologist:  Maurice Small, MD     Referring MD: No ref. provider found   History of Present Illness:    James Ibarra is a 73 y.o. 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     He underwent mitral valve surgery with maze and left atrial appendage clipping on December 08, 2022.  He was treated with amiodarone for postoperative atrial fibrillation.    I discussed the use of AI scribe software for clinical note transcription with the patient, who gave verbal consent to proceed.  The patient, with a history of mitral valve surgery and atrial fibrillation, presents for evaluation of recurrent atrial fibrillation. He reports no sensation of abnormal heartbeats. Prior to his mitral valve surgery, atrial fibrillation was detected during workup, leading to a maze procedure being performed concurrently with the valve surgery. Despite this, the patient Ibarra had recurrent episodes of atrial fibrillation, with a recent monitor showing he was in atrial fibrillation about 12% of the time. The patient's heart rate during these episodes were not controlled with an average V rate of 112 bpm. The patient also reports a recent foot injury that Ibarra limited his ability to exercise.     Today, he reports that he is doing well -- no palpitations, shortness of breath, chest pain.  I reviewed the patient's CT and labs. There was no LAA thrombus. he  Ibarra not missed any doses of anticoagulation, and he took his dose last night. There have been no changes in the patient's diagnoses, medications, or  condition since our recent clinic visit. Will recheck potassium level this AM.  EKGs/Labs/Other Studies Reviewed Today:     Echocardiogram:  TTE November 2024 EF 55 to 60%.  Moderately dilated left atrium.  Grade 2 diastolic dysfunction   Monitors:  14 day monitor November 2024-- my interpretation Sinus rhythm heart rate 50 to 223 bpm, average 79 12% burden of atrial fibrillation, average heart rate 112 bpm Rare ectopy   EKG:         Physical Exam:    VS:  BP (!) 159/81   Pulse (!) 52   Temp 97.9 F (36.6 C) (Oral)   Resp 16   Ht 6\' 1"  (1.854 m)   Wt 70.3 kg   SpO2 99%   BMI 20.45 kg/m     Wt Readings from Last 3 Encounters:  05/24/23 70.3 kg  03/30/23 72.3 kg  03/05/23 70.3 kg     GEN: Well nourished, well developed in no acute distress CARDIAC: RRR, no murmurs, rubs, gallops RESPIRATORY:  Normal work of breathing MUSCULOSKELETAL: no edema    ASSESSMENT & PLAN:     Paroxysmal atrial fibrillation Minimally symptomatic Burden 14% rates not well-controlled He Ibarra had a prior Maze procedure and mitral valve repair We discussed management options; I explained the indication and rationale for remapping of the prior ablation with possible ablation  We discussed the indication, rationale, logistics, anticipated benefits, and potential risks of the ablation procedure including but not limited to -- bleed at the groin access site, chest pain, damage to nearby organs such as  the diaphragm, lungs, or esophagus, need for a drainage tube, or prolonged hospitalization. I explained that the risk for stroke, heart attack, need for open chest surgery, or even death is very low but not zero. he  expressed understanding and wishes to proceed.   Severe nonrheumatic MVR S/p surgical repair with maze and left atrial appendage clip.      Signed, Maurice Small, MD  05/24/2023 7:18 AM    [facility]

## 2023-05-24 NOTE — Transfer of Care (Signed)
 Immediate Anesthesia Transfer of Care Note  Patient: James Ibarra  Procedure(s) Performed: ATRIAL FIBRILLATION ABLATION  Patient Location: PACU and Cath Lab  Anesthesia Type:General  Level of Consciousness: awake, alert , and oriented  Airway & Oxygen Therapy: Patient Spontanous Breathing and Patient connected to face mask oxygen  Post-op Assessment: Report given to RN and Post -op Vital signs reviewed and stable  Post vital signs: Reviewed and stable  Last Vitals:  Vitals Value Taken Time  BP 172/113 05/24/23 0933  Temp    Pulse 79 05/24/23 0935  Resp 13 05/24/23 0935  SpO2 98 % 05/24/23 0935  Vitals shown include unfiled device data.  Last Pain:  Vitals:   05/24/23 0624  TempSrc:   PainSc: 0-No pain      Patients Stated Pain Goal: 5 (05/24/23 1308)  Complications: No notable events documented.

## 2023-05-24 NOTE — Anesthesia Postprocedure Evaluation (Signed)
 Anesthesia Post Note  Patient: James Ibarra  Procedure(s) Performed: ATRIAL FIBRILLATION ABLATION     Patient location during evaluation: PACU Anesthesia Type: General Level of consciousness: awake and alert Pain management: pain level controlled Vital Signs Assessment: post-procedure vital signs reviewed and stable Respiratory status: spontaneous breathing, nonlabored ventilation, respiratory function stable and patient connected to nasal cannula oxygen Cardiovascular status: blood pressure returned to baseline and stable Postop Assessment: no apparent nausea or vomiting Anesthetic complications: no   No notable events documented.  Last Vitals:  Vitals:   05/24/23 1235 05/24/23 1255  BP: (!) 150/85   Pulse: (!) 54 (!) 56  Resp: 19 19  Temp:    SpO2: 96% 96%    Last Pain:  Vitals:   05/24/23 1021  TempSrc:   PainSc: 0-No pain                 [place]

## 2023-05-24 NOTE — Anesthesia Procedure Notes (Signed)
 Procedure Name: Intubation Date/Time: 05/24/2023 7:51 AM  Performed by: Sudie Grumbling, CRNAPre-anesthesia Checklist: Patient identified, Emergency Drugs available, Suction available and Patient being monitored Patient Re-evaluated:Patient Re-evaluated prior to induction Oxygen Delivery Method: Circle system utilized Preoxygenation: Pre-oxygenation with 100% oxygen Induction Type: IV induction Ventilation: Mask ventilation without difficulty Laryngoscope Size: Mac and 4 Grade View: Grade I Tube type: Oral Tube size: 7.5 mm Number of attempts: 1 Airway Equipment and Method: Stylet and Oral airway Placement Confirmation: ETT inserted through vocal cords under direct vision, positive ETCO2 and breath sounds checked- equal and bilateral Secured at: 23 cm Tube secured with: Tape Dental Injury: Teeth and Oropharynx as per pre-operative assessment

## 2023-05-25 ENCOUNTER — Telehealth (HOSPITAL_COMMUNITY): Payer: Self-pay

## 2023-05-25 ENCOUNTER — Encounter (HOSPITAL_COMMUNITY): Payer: Self-pay | Admitting: Cardiovascular Disease

## 2023-05-25 MED FILL — Fentanyl Citrate Preservative Free (PF) Inj 100 MCG/2ML: INTRAMUSCULAR | Qty: 2 | Status: AC

## 2023-05-25 NOTE — Telephone Encounter (Signed)
 Attempted to reach patient to follow up with procedure completed on 05/24/23, no answer. Left VM for patient to return call.

## 2023-05-29 ENCOUNTER — Ambulatory Visit: Admitting: Physical Therapy

## 2023-05-29 DIAGNOSIS — Z09 Encounter for follow-up examination after completed treatment for conditions other than malignant neoplasm: Secondary | ICD-10-CM | POA: Diagnosis not present

## 2023-05-29 DIAGNOSIS — R2689 Other abnormalities of gait and mobility: Secondary | ICD-10-CM

## 2023-05-29 DIAGNOSIS — R293 Abnormal posture: Secondary | ICD-10-CM

## 2023-05-29 DIAGNOSIS — R2681 Unsteadiness on feet: Secondary | ICD-10-CM

## 2023-05-29 DIAGNOSIS — M6281 Muscle weakness (generalized): Secondary | ICD-10-CM | POA: Diagnosis not present

## 2023-05-29 DIAGNOSIS — R5381 Other malaise: Secondary | ICD-10-CM

## 2023-05-29 NOTE — Therapy (Signed)
 OUTPATIENT PHYSICAL THERAPY TREATMENT   Patient Name: James Ibarra MRN: [id number] DOB:09-28-1950, 73 y.o., male Today's Date: 05/29/2023  END OF SESSION:  PT End of Session - 05/29/23 1143     Visit Number 6    Date for PT Re-Evaluation 07/03/23    Authorization Type [employer] MCR (auth requested)    Progress Note Due on Visit 10    PT Start Time 1145    PT Stop Time 1225    PT Time Calculation (min) 40 min    Activity Tolerance Patient tolerated treatment well             Past Medical History:  Diagnosis Date   Cancer (HCC)    Skin Cancer - Scalp, Right neck, Left Hand   Dysrhythmia    A. Fib while hospitalized June 2024   Heart murmur    History of nonmelanoma skin cancer    Hypercholesteremia    Hypertension    Pneumonia 08/2022   Trigeminal neuralgia    Past Surgical History:  Procedure Laterality Date   ATRIAL FIBRILLATION ABLATION N/A 05/24/2023   Procedure: ATRIAL FIBRILLATION ABLATION;  Surgeon: Maurice Small, MD;  Location: [facility];  Service: Cardiovascular;  Laterality: N/A;   CATARACT EXTRACTION W/ INTRAOCULAR LENS IMPLANT Bilateral    CLIPPING OF ATRIAL APPENDAGE Left 12/08/2022   Procedure: CLIPPING OF ATRIAL APPENDAGE (LAA) USING 50 MM ATRICLIP;  Surgeon: Eugenio Hoes, MD;  Location: [facility] OR;  Service: Open Heart Surgery;  Laterality: Left;   INGUINAL HERNIA REPAIR  11/07/2011   Procedure: LAPAROSCOPIC INGUINAL HERNIA;  Surgeon: Atilano Ina, MD,FACS;  Location: [facility];  Service: General;  Laterality: Left;   MAZE N/A 12/08/2022   Procedure: MAZE;  Surgeon: Eugenio Hoes, MD;  Location: [facility] OR;  Service: Open Heart Surgery;  Laterality: N/A;   MITRAL VALVE REPAIR N/A 12/08/2022   Procedure: MITRAL VALVE REPAIR (MVR) USING 34 MM SIMULUS SEMI- RIGID ANNULOPLASTY BAND;  Surgeon: Eugenio Hoes, MD;  Location: [facility] OR;  Service: Open Heart Surgery;  Laterality: N/A;   RETINAL DETACHMENT SURGERY  03/07/1995   RIGHT/LEFT HEART CATH AND CORONARY  ANGIOGRAPHY N/A 10/12/2022   Procedure: RIGHT/LEFT HEART CATH AND CORONARY ANGIOGRAPHY;  Surgeon: [name], MD;  Location: [facility];  Service: Cardiovascular;  Laterality: N/A;   SKIN CANCER EXCISION  2012, 1999   basal cell - neck and hand   TEE WITHOUT CARDIOVERSION N/A 10/16/2022   Procedure: TRANSESOPHAGEAL ECHOCARDIOGRAM;  Surgeon: Christell Constant, MD;  Location: [facility];  Service: Cardiovascular;  Laterality: N/A;   TEE WITHOUT CARDIOVERSION N/A 12/08/2022   Procedure: TRANSESOPHAGEAL ECHOCARDIOGRAM;  Surgeon: Eugenio Hoes, MD;  Location: [facility] OR;  Service: Open Heart Surgery;  Laterality: N/A;   Patient Active Problem List   Diagnosis Date Noted   S/P MVR (mitral valve repair) 12/08/2022   Protein-calorie malnutrition, severe 08/19/2022   Alcohol withdrawal (HCC) 08/14/2022   Acute metabolic encephalopathy 08/14/2022   Paroxysmal atrial fibrillation with RVR (HCC) 08/14/2022   Nonrheumatic mitral valve regurgitation 08/14/2022   Mitral valve prolapse 08/14/2022   PNA (pneumonia) 08/12/2022   Severe sepsis (HCC) 08/12/2022   HLD (hyperlipidemia) 08/12/2022   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   Trigeminal neuralgia of left side of face 07/15/2019   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   Hypertension 10/31/2013   Pain in joint, shoulder region 04/14/2013   Nodule of right lung 11/07/2011    PCP: [name],  [name], MD  REFERRING PROVIDER: Farris Has, MD  REFERRING DIAG: [phone] (ICD-10-CM) [facility] discharge follow-up  THERAPY DIAG:  Unsteadiness on feet  Other abnormalities of gait and mobility  Muscle weakness (generalized)  Abnormal posture  Physical deconditioning  Balance problem  Rationale for Evaluation and Treatment: Rehabilitation  ONSET DATE: June 2024  SUBJECTIVE:  Pt states his copay went up and has concerns about his insurance approval. Reports ablation went well to his knowledge.   PERTINENT  HISTORY: Skin cancer; HTN ; open heart surgery 2023  PAIN:  Are you having pain? No  PRECAUTIONS: None  WEIGHT BEARING RESTRICTIONS: No  FALLS:  Has patient fallen in last 6 months? No  LIVING ENVIRONMENT: Lives with: lives with their spouse Lives in: House/apartment Stairs: Yes: Internal: down to basement but rarely uses them steps; bilateral but cannot reach both and External: 8 steps; can reach both Has following equipment at home: Dan Humphreys - 2 wheeled  OCCUPATION: Retired; Environmental health practitioner in pharmacology   PLOF: Independent, Independent with basic ADLs, Independent with household mobility without device, Independent with community mobility without device, Independent with gait, and Independent with transfers  PATIENT GOALS: Strengthening his legs and working on balance  NEXT MD VISIT: PRN  OBJECTIVE:  Note: Objective measures were completed at Evaluation unless otherwise noted.  PATIENT SURVEYS:  ABC scale 71% moderate level of physical functioning  POSTURE: rounded shoulders and forward head   LOWER EXTREMITY MMT: Grossly 4/5    FUNCTIONAL TESTS:  5 times sit to stand: 15.12 sec with UE support Timed up and go (TUG): 9.49sec 3 minute walk test: 561 RPE:0-1  SL Balance Rt 12 sec  Lt 8 sec with finger touch on counter; < 2 sec with no UE support  GAIT: Distance walked: 561FT Assistive device utilized: None Level of assistance: Complete Independence Comments: Wide BOS; decreased cadence  DYNAMIC GAIT INDEX: 3/6: 18/24  MINI-BESTest: Balance Evaluation Systems Test ANTICIPATORY: SIT TO STAND: (2) normal without use of hands and stabilizes independently     (1) Moderate comes to stand WITH use of hands on 1st attempt    (0) Severe: unable to stand up from chair without assistance OR needs several attempts with use of   hands Score: 2  2. RISE TO TOES: feet shoulder width apart. Hands on hips.  Rise as high as you can onto your toes. Try to hold this pose for  3 sec                          (2) stable for 3s with maximum height    (1) heels up, but not full range (smaller than when holding hands) OR noticeable instability for 3 sec                           (0) < or equat to 3 s  Score: 1  REACTIVE POSTURAL CONTROL 4. COMPENSATORY STEPPING CORRECTION FORWARD stand with feet shoulder width apart, arms at your sides. Lean forward against my hands beyond your forward limits.  When I let go, do whatever is necessary, including taking a step, to avoid a fall   (2) recovers independently  with a single, large step, to avoid a fall   (1) more than one step used to recover equilibrium   (0) No step OR would fall if not caught Score: 1  5. COMPENSATORY STEPPING CORRECTION BACKWARD: Lean backward against my  hands beyond your backward limits.  When I let go, do whatever is necessary, including a step to avoid a fall. (2) recovers independently  with a single, large step, to avoid a fall   (1) more than one step used to recover equilibrium   (0) No step OR would fall if not caught Score: 1  6. COMPENSATORY STEPPING LATERAL: Lean into my hand beyond your sideways limit.  When I let go, do whatever is necessary, including taking a step, to avoid a fall. Left           Right   (2) recovers independently with 1 step (crossover or lateral OK)   (1) several steps to recover equilibrium   (0) falls or cannot step Score: 1   SENSORY ORIENTATION 7. STANCE FEET TOGETHER EYES OPEN  firm surface Be as stable and still as possible until I say stop   (2) 30s   (1) < 30 s   (0) unable Score: 2  8. STANCE ON FOAM EYES CLOSED feet together, eyes closed, hands on hips. Be as stable and still as you can until I stay stop.  I will start timing when you close your eyes. Time in seconds:      (2) 30s   (1) <30 s   (0) unable Score: 1  9. INCLINE EYES CLOSED: Stand on incline with feet shoulder width apart, arms down at your sides.  I will start timing when you close  your eyes   (2) Stands 30 s and aligns with gravity   (1) stands < 30 s OR aligns with surface   (0) unable Score: 1  DYNAMIC GAIT 10. CHANGE IN GAIT SPEED: begin walking at your normal speed, when I tell you "fast", walk as fast as you can, when I say "slow" walk very slowly   (2) significantly changes walking speed without imbalance   (1) unable to change walking speed or signs of imbalance   (0) unable to achieve significant change in walking speed AND signes of imbalance Score: 1  11. WALK WITH HEAD TURNS HORIZONTAL: begin walking at normal speed, when I say right, turn your head to the right, when I say left turn your head to the left.  Try to keep yourself walking in a straight line   (2) performs head turns with no change in gait speed and good balance   (1) performs head turns with reduction in gait speed   (0)  performs head turns with imbalance Score: 1  12. WALK WITH PIVOT TURNS: begin walking at your normal speed.  When I tell you to "turn and stop", turn as quickly as you can, face the opposite direction and stop.  After the turn, your feet should be close together   (2) turns with feet close FAST in 3 steps with good balance   (1) turns with feet close SLOW > 4 steps with good balance   (0) cannot turn with feet close at any speed without imbalance Score: 1  13. STEP OVER OBSTACLES:  ( 9 inch height 10 feet away from subject) begin walking at your normal speed.  When you get to the box, step over it, not around it and keep walking   (2) Able to step over box with minimal change of gait speed and good balance   (1) Steps over box but touches box OR displays cautious behavior by slowing gait   (0) Unable to step over box OR steps around box Score:  1                                                                                                                                 TREATMENT DATE:  05/29/23: Nustep L5 x 8 min UEs/LEs Staggered stance heel/toe raise  2x10 Standing Gastroc stretch 2x 30" Standing quad stretch x30" Leg press seat 8 90# bil 15x; 50# single leg press 10x right/left  Hip abd red TB 2x10 Hip ext red TB 2x10 Marching red TB 2x10   05/22/23: Nustep L5 x 8 min UEs/LEs Staggered heel/toe raise 2x10 Counter hip ext 2x10 PWR!rock 2x10 PWR!step side 2x10  Side step on foam balance beam x 5 laps Tandem walking on foam balance beam x 5 laps Standing feet together on foam beam Head turns and then head nods x10 each   05/17/23: Nustep L5 x 8 min UEs/LEs Staggered heel/toe raise 2x10 Gastroc stretch x30" Soleus stretch x30" PWR!rock 2x10 (one hand support) PWR!step side 2x10 (one hand support), PWR!step back 2x10 (bilat UE support), PWR!step fwd 2x10 (no UE support) Standing feet together on airex EO head rotations 2x30 sec; head nods 2x30 sec   05/15/23: Nustep L5 x 6 min UEs/LEs Standing heel/toe raise 2x10 PWR!up 2x10 PWR!rock 2x10 PWR!step 2x10 Standing feet together on airex EO 2x30 sec Standing feet together on airex EO, head rotations 2x30 sec; head nods 2x30 sec Standing feet apart on airex EC 2x30 sec Side step up and over pink airex 2x10  05/10/2023: Nu-Step L3 6 min while discussing status and plan DGI as above Some components of Mini-Best test as above Sit to stand hi-low table (just (right knee pain) RPE: 1/10 Heel raises bil 5x, staggered stance 5x each way Holding to counter reverse lunge 5x right/left 2 sets Leg press seat 8 80# bil 15x; 40# single leg press 10x right/left  RPE 3/10   PATIENT EDUCATION:  Education details: POC; HEP compliance; HEP Person educated: Patient Education method: Explanation, Demonstration, and Handouts Education comprehension: verbalized understanding, returned demonstration, and needs further education  HOME EXERCISE PROGRAM: Access Code: [email or web address] URL: [email or web address] Date: 05/08/2023 Prepared by: Claude Manges  Exercises - Sit to Stand with  Armchair  - 1 x daily - 7 x weekly - 1 sets - 10 reps - Standing Hip Extension with Counter Support  - 1 x daily - 7 x weekly - 2 sets - 10 reps - Standing Hip Abduction with Counter Support  - 1 x daily - 7 x weekly - 2 sets - 10 reps - Heel Raises with Counter Support  - 1 x daily - 7 x weekly - 2 sets - 10 reps - Single Leg Stance with Support  - 1 x daily - 7 x weekly - 1 sets - 30 hold  ASSESSMENT:  CLINICAL IMPRESSION: Session focused more on progressive strengthening this session. Worked on improving quad and calf stretching as well.   OBJECTIVE IMPAIRMENTS: Abnormal gait, decreased  activity tolerance, decreased balance, decreased endurance, difficulty walking, decreased strength, and postural dysfunction.     GOALS: Goals reviewed with patient? Yes  SHORT TERM GOALS: Target date: 06/05/2023  Patient will be independent with initial HEP. Baseline:  Goal status: INITIAL  2.  Patient will report > or = to 30% improvement in overall balance confidence since starting PT. Baseline:  Goal status: INITIAL  3.  Patient will be able to complete sit to stand without UE support for improved functional strength. Baseline:  Goal status: INITIAL    LONG TERM GOALS: Target date: 07/03/2023  Patient will demonstrate independence in advanced HEP. Baseline:  Goal status: INITIAL  2.  Patient will report > or = to 60% improvement in balance confidence since starting PT. Baseline:  Goal status: INITIAL   3.  Patient will score < or = to 12 sec on 5STS for improved functional strength. Baseline: 15.12 sec Goal status: INITIAL  4.  Patient will ambulate > or = to 663ft on for improved community negotiation.  Baseline:  Goal status: INITIAL  5.  Patient will be able to ascend / descend a curb for improved community negotiation. Baseline:  Goal status: INITIAL     PLAN:  PT FREQUENCY: 2x/week  PT DURATION: 8 weeks  PLANNED INTERVENTIONS: 97164- PT Re-evaluation,  97110-Therapeutic exercises, 97530- Therapeutic activity, 97112- Neuromuscular re-education, 97535- Self Care, 78295- Manual therapy, [phone]- Gait training, [phone]- Canalith repositioning, [id number]- Aquatic Therapy, 97016- Vasopneumatic device, [id number]- Traction (mechanical), [id number]- Ionotophoresis 4mg /ml Dexamethasone, Balance training, Stair training, Taping, Dry Needling, Joint mobilization, Joint manipulation, Spinal manipulation, Spinal mobilization, Vestibular training, Cryotherapy, and Moist heat  PLAN FOR NEXT SESSION:  leg strengthening (leg press/ step ups/staggered STS); dynamic balance  Treveon Bourcier April Dell Ponto, PT 05/29/23 11:43 AM Phone: [phone] Fax: [phone]

## 2023-05-31 ENCOUNTER — Ambulatory Visit: Admitting: Physical Therapy

## 2023-05-31 DIAGNOSIS — R5381 Other malaise: Secondary | ICD-10-CM

## 2023-05-31 DIAGNOSIS — Z09 Encounter for follow-up examination after completed treatment for conditions other than malignant neoplasm: Secondary | ICD-10-CM | POA: Diagnosis not present

## 2023-05-31 DIAGNOSIS — R2681 Unsteadiness on feet: Secondary | ICD-10-CM

## 2023-05-31 DIAGNOSIS — R293 Abnormal posture: Secondary | ICD-10-CM | POA: Diagnosis not present

## 2023-05-31 DIAGNOSIS — R2689 Other abnormalities of gait and mobility: Secondary | ICD-10-CM

## 2023-05-31 DIAGNOSIS — M6281 Muscle weakness (generalized): Secondary | ICD-10-CM

## 2023-05-31 NOTE — Therapy (Signed)
 OUTPATIENT PHYSICAL THERAPY TREATMENT   Patient Name: James Ibarra MRN: [id number] DOB:June 22, 1950, 73 y.o., male Today's Date: 05/31/2023  END OF SESSION:  PT End of Session - 05/31/23 1151     Visit Number 7    Date for PT Re-Evaluation 07/03/23    Authorization Type [employer] MCR (auth requested)    Progress Note Due on Visit 10    PT Start Time 1151    PT Stop Time 1230    PT Time Calculation (min) 39 min    Activity Tolerance Patient tolerated treatment well              Past Medical History:  Diagnosis Date   Cancer (HCC)    Skin Cancer - Scalp, Right neck, Left Hand   Dysrhythmia    A. Fib while hospitalized June 2024   Heart murmur    History of nonmelanoma skin cancer    Hypercholesteremia    Hypertension    Pneumonia 08/2022   Trigeminal neuralgia    Past Surgical History:  Procedure Laterality Date   ATRIAL FIBRILLATION ABLATION N/A 05/24/2023   Procedure: ATRIAL FIBRILLATION ABLATION;  Surgeon: Maurice Small, MD;  Location: [facility];  Service: Cardiovascular;  Laterality: N/A;   CATARACT EXTRACTION W/ INTRAOCULAR LENS IMPLANT Bilateral    CLIPPING OF ATRIAL APPENDAGE Left 12/08/2022   Procedure: CLIPPING OF ATRIAL APPENDAGE (LAA) USING 50 MM ATRICLIP;  Surgeon: Eugenio Hoes, MD;  Location: [facility] OR;  Service: Open Heart Surgery;  Laterality: Left;   INGUINAL HERNIA REPAIR  11/07/2011   Procedure: LAPAROSCOPIC INGUINAL HERNIA;  Surgeon: Atilano Ina, MD,FACS;  Location: [facility];  Service: General;  Laterality: Left;   MAZE N/A 12/08/2022   Procedure: MAZE;  Surgeon: Eugenio Hoes, MD;  Location: [facility];  Service: Open Heart Surgery;  Laterality: N/A;   MITRAL VALVE REPAIR N/A 12/08/2022   Procedure: MITRAL VALVE REPAIR (MVR) USING 34 MM SIMULUS SEMI- RIGID ANNULOPLASTY BAND;  Surgeon: Eugenio Hoes, MD;  Location: [facility] OR;  Service: Open Heart Surgery;  Laterality: N/A;   RETINAL DETACHMENT SURGERY  03/07/1995   RIGHT/LEFT HEART CATH AND CORONARY  ANGIOGRAPHY N/A 10/12/2022   Procedure: RIGHT/LEFT HEART CATH AND CORONARY ANGIOGRAPHY;  Surgeon: [name], MD;  Location: [facility];  Service: Cardiovascular;  Laterality: N/A;   SKIN CANCER EXCISION  2012, 1999   basal cell - neck and hand   TEE WITHOUT CARDIOVERSION N/A 10/16/2022   Procedure: TRANSESOPHAGEAL ECHOCARDIOGRAM;  Surgeon: Christell Constant, MD;  Location: [facility];  Service: Cardiovascular;  Laterality: N/A;   TEE WITHOUT CARDIOVERSION N/A 12/08/2022   Procedure: TRANSESOPHAGEAL ECHOCARDIOGRAM;  Surgeon: Eugenio Hoes, MD;  Location[facility] OR;  Service: Open Heart Surgery;  Laterality: N/A;   Patient Active Problem List   Diagnosis Date Noted   S/P MVR (mitral valve repair) 12/08/2022   Protein-calorie malnutrition, severe 08/19/2022   Alcohol withdrawal (HCC) 08/14/2022   Acute metabolic encephalopathy 08/14/2022   Paroxysmal atrial fibrillation with RVR (HCC) 08/14/2022   Nonrheumatic mitral valve regurgitation 08/14/2022   Mitral valve prolapse 08/14/2022   PNA (pneumonia) 08/12/2022   Severe sepsis (HCC) 08/12/2022   HLD (hyperlipidemia) 08/12/2022   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   Trigeminal neuralgia of left side of face 07/15/2019   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   Hypertension 10/31/2013   Pain in joint, shoulder region 04/14/2013   Nodule of right lung 11/07/2011    PCP:  Farris Has, MD  REFERRING PROVIDER: Farris Has, MD  REFERRING DIAG: [phone] (ICD-10-CM) [facility] discharge follow-up  THERAPY DIAG:  Unsteadiness on feet  Other abnormalities of gait and mobility  Muscle weakness (generalized)  Abnormal posture  Physical deconditioning  Balance problem  Rationale for Evaluation and Treatment: Rehabilitation  ONSET DATE: June 2024  SUBJECTIVE:  Pt will have f/u with cardiology next week to see if his procedure was successful. Pt states "I can tell my legs are getting stronger  because it's getting easier to get off the toilet."  PERTINENT HISTORY: Skin cancer; HTN ; open heart surgery 2023  PAIN:  Are you having pain? No  PRECAUTIONS: None  WEIGHT BEARING RESTRICTIONS: No  FALLS:  Has patient fallen in last 6 months? No  LIVING ENVIRONMENT: Lives with: lives with their spouse Lives in: House/apartment Stairs: Yes: Internal: down to basement but rarely uses them steps; bilateral but cannot reach both and External: 8 steps; can reach both Has following equipment at home: Dan Humphreys - 2 wheeled  OCCUPATION: Retired; Environmental health practitioner in pharmacology   PLOF: Independent, Independent with basic ADLs, Independent with household mobility without device, Independent with community mobility without device, Independent with gait, and Independent with transfers  PATIENT GOALS: Strengthening his legs and working on balance  NEXT MD VISIT: PRN  OBJECTIVE:  Note: Objective measures were completed at Evaluation unless otherwise noted.  PATIENT SURVEYS:  ABC scale 71% moderate level of physical functioning  POSTURE: rounded shoulders and forward head   LOWER EXTREMITY MMT: Grossly 4/5    FUNCTIONAL TESTS:  5 times sit to stand: 15.12 sec with UE support Timed up and go (TUG): 9.49sec 3 minute walk test: 561 RPE:0-1  SL Balance Rt 12 sec  Lt 8 sec with finger touch on counter; < 2 sec with no UE support  GAIT: Distance walked: 561FT Assistive device utilized: None Level of assistance: Complete Independence Comments: Wide BOS; decreased cadence  DYNAMIC GAIT INDEX: 3/6: 18/24  MINI-BESTest: Balance Evaluation Systems Test ANTICIPATORY: SIT TO STAND: (2) normal without use of hands and stabilizes independently     (1) Moderate comes to stand WITH use of hands on 1st attempt    (0) Severe: unable to stand up from chair without assistance OR needs several attempts with use of   hands Score: 2  2. RISE TO TOES: feet shoulder width apart. Hands on hips.   Rise as high as you can onto your toes. Try to hold this pose for 3 sec                          (2) stable for 3s with maximum height    (1) heels up, but not full range (smaller than when holding hands) OR noticeable instability for 3 sec                           (0) < or equat to 3 s  Score: 1  REACTIVE POSTURAL CONTROL 4. COMPENSATORY STEPPING CORRECTION FORWARD stand with feet shoulder width apart, arms at your sides. Lean forward against my hands beyond your forward limits.  When I let go, do whatever is necessary, including taking a step, to avoid a fall   (2) recovers independently  with a single, large step, to avoid a fall   (1) more than one step used to recover equilibrium   (0) No step OR would fall if  not caught Score: 1  5. COMPENSATORY STEPPING CORRECTION BACKWARD: Lean backward against my hands beyond your backward limits.  When I let go, do whatever is necessary, including a step to avoid a fall. (2) recovers independently  with a single, large step, to avoid a fall   (1) more than one step used to recover equilibrium   (0) No step OR would fall if not caught Score: 1  6. COMPENSATORY STEPPING LATERAL: Lean into my hand beyond your sideways limit.  When I let go, do whatever is necessary, including taking a step, to avoid a fall. Left           Right   (2) recovers independently with 1 step (crossover or lateral OK)   (1) several steps to recover equilibrium   (0) falls or cannot step Score: 1   SENSORY ORIENTATION 7. STANCE FEET TOGETHER EYES OPEN  firm surface Be as stable and still as possible until I say stop   (2) 30s   (1) < 30 s   (0) unable Score: 2  8. STANCE ON FOAM EYES CLOSED feet together, eyes closed, hands on hips. Be as stable and still as you can until I stay stop.  I will start timing when you close your eyes. Time in seconds:      (2) 30s   (1) <30 s   (0) unable Score: 1  9. INCLINE EYES CLOSED: Stand on incline with feet shoulder width  apart, arms down at your sides.  I will start timing when you close your eyes   (2) Stands 30 s and aligns with gravity   (1) stands < 30 s OR aligns with surface   (0) unable Score: 1  DYNAMIC GAIT 10. CHANGE IN GAIT SPEED: begin walking at your normal speed, when I tell you "fast", walk as fast as you can, when I say "slow" walk very slowly   (2) significantly changes walking speed without imbalance   (1) unable to change walking speed or signs of imbalance   (0) unable to achieve significant change in walking speed AND signes of imbalance Score: 1  11. WALK WITH HEAD TURNS HORIZONTAL: begin walking at normal speed, when I say right, turn your head to the right, when I say left turn your head to the left.  Try to keep yourself walking in a straight line   (2) performs head turns with no change in gait speed and good balance   (1) performs head turns with reduction in gait speed   (0)  performs head turns with imbalance Score: 1  12. WALK WITH PIVOT TURNS: begin walking at your normal speed.  When I tell you to "turn and stop", turn as quickly as you can, face the opposite direction and stop.  After the turn, your feet should be close together   (2) turns with feet close FAST in 3 steps with good balance   (1) turns with feet close SLOW > 4 steps with good balance   (0) cannot turn with feet close at any speed without imbalance Score: 1  13. STEP OVER OBSTACLES:  ( 9 inch height 10 feet away from subject) begin walking at your normal speed.  When you get to the box, step over it, not around it and keep walking   (2) Able to step over box with minimal change of gait speed and good balance   (1) Steps over box but touches box OR displays cautious behavior by slowing  gait   (0) Unable to step over box OR steps around box Score: 1                                                                                                                                 TREATMENT DATE:   05/31/23: Bike L4 x 6 min Leg press seat 8 100# bil 15x; 60# single leg press 10x right/left  Runner's step up 6" 2x10 intermittent UE support Feet apart on foam eyes closed 2x30" Feet together on black foam EC head turns and head nods 2x30" each Heel/toe raise on black foam 2x10 Fwd step off black foam x10 R&L Bwd step off black foam x10 R&L Fwd amb with head turns 3x50' (minor LOBs) Fwd amb with head nods 3x50'  05/29/23: Nustep L5 x 8 min UEs/LEs Staggered stance heel/toe raise 2x10 Standing Gastroc stretch 2x 30" Standing quad stretch x30" Leg press seat 8 90# bil 15x; 50# single leg press 10x right/left  Hip abd red TB 2x10 Hip ext red TB 2x10 Marching red TB 2x10   05/22/23: Nustep L5 x 8 min UEs/LEs Staggered heel/toe raise 2x10 Counter hip ext 2x10 PWR!rock 2x10 PWR!step side 2x10  Side step on foam balance beam x 5 laps Tandem walking on foam balance beam x 5 laps Standing feet together on foam beam Head turns and then head nods x10 each   05/17/23: Nustep L5 x 8 min UEs/LEs Staggered heel/toe raise 2x10 Gastroc stretch x30" Soleus stretch x30" PWR!rock 2x10 (one hand support) PWR!step side 2x10 (one hand support), PWR!step back 2x10 (bilat UE support), PWR!step fwd 2x10 (no UE support) Standing feet together on airex EO head rotations 2x30 sec; head nods 2x30 sec    PATIENT EDUCATION:  Education details: POC; HEP compliance; HEP Person educated: Patient Education method: Explanation, Demonstration, and Handouts Education comprehension: verbalized understanding, returned demonstration, and needs further education  HOME EXERCISE PROGRAM: Access Code: [email or web address] URL: [email or web address] Date: 05/08/2023 Prepared by: Claude Manges  Exercises - Sit to Stand with Armchair  - 1 x daily - 7 x weekly - 1 sets - 10 reps - Standing Hip Extension with Counter Support  - 1 x daily - 7 x weekly - 2 sets - 10 reps - Standing Hip Abduction with Counter  Support  - 1 x daily - 7 x weekly - 2 sets - 10 reps - Heel Raises with Counter Support  - 1 x daily - 7 x weekly - 2 sets - 10 reps - Single Leg Stance with Support  - 1 x daily - 7 x weekly - 1 sets - 30 hold  ASSESSMENT:  CLINICAL IMPRESSION: Continued progressive strengthening. Working on increasing single leg stability, strength and balance with step ups today. More stable and less tremors when balancing on L LE. Focused on improving his stepping strategies and balance on/off compliant surfaces (this was fatiguing and challenging for pt).   OBJECTIVE IMPAIRMENTS: Abnormal  gait, decreased activity tolerance, decreased balance, decreased endurance, difficulty walking, decreased strength, and postural dysfunction.     GOALS: Goals reviewed with patient? Yes  SHORT TERM GOALS: Target date: 06/05/2023  Patient will be independent with initial HEP. Baseline:  Goal status: INITIAL  2.  Patient will report > or = to 30% improvement in overall balance confidence since starting PT. Baseline:  Goal status: INITIAL  3.  Patient will be able to complete sit to stand without UE support for improved functional strength. Baseline:  Goal status: INITIAL    LONG TERM GOALS: Target date: 07/03/2023  Patient will demonstrate independence in advanced HEP. Baseline:  Goal status: INITIAL  2.  Patient will report > or = to 60% improvement in balance confidence since starting PT. Baseline:  Goal status: INITIAL   3.  Patient will score < or = to 12 sec on 5STS for improved functional strength. Baseline: 15.12 sec Goal status: INITIAL  4.  Patient will ambulate > or = to 674ft on for improved community negotiation.  Baseline:  Goal status: INITIAL  5.  Patient will be able to ascend / descend a curb for improved community negotiation. Baseline:  Goal status: INITIAL     PLAN:  PT FREQUENCY: 2x/week  PT DURATION: 8 weeks  PLANNED INTERVENTIONS: [id number]- PT Re-evaluation,  97110-Therapeutic exercises, 97530- Therapeutic activity, 97112- Neuromuscular re-education, 97535- Self Care, [id number]- Manual therapy, [phone]- Gait training, [phone]- Canalith repositioning, [id number]- Aquatic Therapy, [phone]- Vasopneumatic device, [id number]- Traction (mechanical), [phone]- Ionotophoresis 4mg /ml Dexamethasone, Balance training, Stair training, Taping, Dry Needling, Joint mobilization, Joint manipulation, Spinal manipulation, Spinal mobilization, Vestibular training, Cryotherapy, and Moist heat  PLAN FOR NEXT SESSION:  leg strengthening (leg press/ step ups/staggered STS); dynamic balance; balance on foam (eyes closed), stepping strategies  Garmon Dehn April Dell Ponto, PT 05/31/23 11:51 AM Phone: [phone] Fax: [phone]

## 2023-06-05 ENCOUNTER — Ambulatory Visit: Attending: Family Medicine | Admitting: Rehabilitative and Restorative Service Providers"

## 2023-06-05 ENCOUNTER — Encounter: Payer: Self-pay | Admitting: Rehabilitative and Restorative Service Providers"

## 2023-06-05 DIAGNOSIS — R293 Abnormal posture: Secondary | ICD-10-CM | POA: Insufficient documentation

## 2023-06-05 DIAGNOSIS — R2681 Unsteadiness on feet: Secondary | ICD-10-CM | POA: Insufficient documentation

## 2023-06-05 DIAGNOSIS — R2689 Other abnormalities of gait and mobility: Secondary | ICD-10-CM | POA: Diagnosis not present

## 2023-06-05 DIAGNOSIS — M6281 Muscle weakness (generalized): Secondary | ICD-10-CM | POA: Insufficient documentation

## 2023-06-05 NOTE — Therapy (Signed)
 OUTPATIENT PHYSICAL THERAPY TREATMENT   Patient Name: James Ibarra MRN: [id number] DOB:10-02-1950, 73 y.o., male Today's Date: 06/05/2023  Progress Note Reporting Period 05/08/2023 to 06/05/2023  See note below for Objective Data and Assessment of Progress/Goals.      END OF SESSION:  PT End of Session - 06/05/23 1146     Visit Number 8    Date for PT Re-Evaluation 07/03/23    Authorization Type [employer] MCR    Authorization Time Period 05/09/2023 - 07/04/2023    Authorization - Visit Number 8    Authorization - Number of Visits 8    Progress Note Due on Visit 18    PT Start Time 1145    PT Stop Time 1225    PT Time Calculation (min) 40 min    Activity Tolerance Patient tolerated treatment well    Behavior During Therapy WFL for tasks assessed/performed              Past Medical History:  Diagnosis Date   Cancer (HCC)    Skin Cancer - Scalp, Right neck, Left Hand   Dysrhythmia    A. Fib while hospitalized June 2024   Heart murmur    History of nonmelanoma skin cancer    Hypercholesteremia    Hypertension    Pneumonia 08/2022   Trigeminal neuralgia    Past Surgical History:  Procedure Laterality Date   ATRIAL FIBRILLATION ABLATION N/A 05/24/2023   Procedure: ATRIAL FIBRILLATION ABLATION;  Surgeon: Maurice Small, MD;  Location: [facility];  Service: Cardiovascular;  Laterality: N/A;   CATARACT EXTRACTION W/ INTRAOCULAR LENS IMPLANT Bilateral    CLIPPING OF ATRIAL APPENDAGE Left 12/08/2022   Procedure: CLIPPING OF ATRIAL APPENDAGE (LAA) USING 50 MM ATRICLIP;  Surgeon: Eugenio Hoes, MD;  Location: [facility] OR;  Service: Open Heart Surgery;  Laterality: Left;   INGUINAL HERNIA REPAIR  11/07/2011   Procedure: LAPAROSCOPIC INGUINAL HERNIA;  Surgeon: Atilano Ina, MD,FACS;  Location: [facility];  Service: General;  Laterality: Left;   MAZE N/A 12/08/2022   Procedure: MAZE;  Surgeon: Eugenio Hoes, MD;  Location: [facility] OR;  Service: Open Heart Surgery;  Laterality: N/A;    MITRAL VALVE REPAIR N/A 12/08/2022   Procedure: MITRAL VALVE REPAIR (MVR) USING 34 MM SIMULUS SEMI- RIGID ANNULOPLASTY BAND;  Surgeon: Eugenio Hoes, MD;  Location: [facility] OR;  Service: Open Heart Surgery;  Laterality: N/A;   RETINAL DETACHMENT SURGERY  03/07/1995   RIGHT/LEFT HEART CATH AND CORONARY ANGIOGRAPHY N/A 10/12/2022   Procedure: RIGHT/LEFT HEART CATH AND CORONARY ANGIOGRAPHY;  Surgeon: [name], MD;  Location: [facility];  Service: Cardiovascular;  Laterality: N/A;   SKIN CANCER EXCISION  2012, 1999   basal cell - neck and hand   TEE WITHOUT CARDIOVERSION N/A 10/16/2022   Procedure: TRANSESOPHAGEAL ECHOCARDIOGRAM;  Surgeon: Christell Constant, MD;  Location: [facility];  Service: Cardiovascular;  Laterality: N/A;   TEE WITHOUT CARDIOVERSION N/A 12/08/2022   Procedure: TRANSESOPHAGEAL ECHOCARDIOGRAM;  Surgeon: Eugenio Hoes, MD;  Location: [facility] OR;  Service: Open Heart Surgery;  Laterality: N/A;   Patient Active Problem List   Diagnosis Date Noted   S/P MVR (mitral valve repair) 12/08/2022   Protein-calorie malnutrition, severe 08/19/2022   Alcohol withdrawal (HCC) 08/14/2022   Acute metabolic encephalopathy 08/14/2022   Paroxysmal atrial fibrillation with RVR (HCC) 08/14/2022   Nonrheumatic mitral valve regurgitation 08/14/2022   Mitral valve prolapse 08/14/2022   PNA (pneumonia) 08/12/2022   Severe sepsis (HCC)  08/12/2022   HLD (hyperlipidemia) 08/12/2022   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   Trigeminal neuralgia of left side of face 07/15/2019   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  PERTINENT HISTORY: Skin cancer; HTN ; open heart surgery 2023  PAIN:  Are you having pain? Yes: NPRS scale: 1-2/10 Pain location: right knee Pain description: sore Aggravating factors: increased walking yesterday Relieving factors: rest  PRECAUTIONS: None  WEIGHT BEARING RESTRICTIONS: No  FALLS:  Has patient fallen in last 6 months? No  LIVING ENVIRONMENT: Lives with: lives with their spouse Lives in: House/apartment Stairs: Yes: Internal: down to basement but rarely uses them steps; bilateral but cannot reach both and External: 8 steps; can reach both Has following equipment at home: Dan Humphreys - 2 wheeled  OCCUPATION: Retired; Environmental health practitioner in pharmacology   PLOF: Independent, Independent with basic ADLs, Independent with household mobility without device, Independent with community mobility without device, Independent with gait, and Independent with transfers  PATIENT GOALS: Strengthening his legs and working on balance  NEXT MD VISIT: PRN  OBJECTIVE:  Note: Objective measures were completed at Evaluation unless otherwise noted.  PATIENT SURVEYS:  Eval:  ABC scale 71% moderate level of physical functioning 06/05/2023:  Total ABC score: 1540 / 1600 = 96.3 %  POSTURE: rounded shoulders and forward head   LOWER EXTREMITY MMT: Grossly 4/5    FUNCTIONAL TESTS:  Eval: 5 times sit to stand: 15.12 sec with UE support Timed up and go (TUG): 9.49sec 3 minute walk test: 561 RPE:0-1 SL Balance Rt 12 sec  Lt 8 sec with finger touch on counter; < 2 sec with no UE support  06/05/2023: 5 times sit to stand: 13.25 sec with only occasional pushing through thighs with forearms 3 minute walk test: 648 ft with RPE of 1-2/10 Single Leg Stance:  right- 2.53  sec, left- 4.16 sec  GAIT: Distance walked: 561FT Assistive device utilized: None Level of assistance: Complete Independence Comments: Wide BOS; decreased cadence  DYNAMIC GAIT INDEX: 3/6: 18/24  MINI-BESTest: Balance Evaluation Systems Test ANTICIPATORY: SIT TO STAND: (2) normal without use of hands and stabilizes independently     (1) Moderate comes to stand WITH use of hands on 1st attempt    (0) Severe: unable to stand up from chair without assistance OR needs several attempts with use of   hands Score: 2  2. RISE TO TOES: feet shoulder width apart. Hands on hips.  Rise as high as you can onto your toes. Try to hold this pose for 3 sec                          (2) stable for 3s with maximum height    (1) heels up, but not full range (smaller than when holding hands) OR noticeable instability for 3 sec                           (  0) < or equat to 3 s  Score: 1  REACTIVE POSTURAL CONTROL 4. COMPENSATORY STEPPING CORRECTION FORWARD stand with feet shoulder width apart, arms at your sides. Lean forward against my hands beyond your forward limits.  When I let go, do whatever is necessary, including taking a step, to avoid a fall   (2) recovers independently  with a single, large step, to avoid a fall   (1) more than one step used to recover equilibrium   (0) No step OR would fall if not caught Score: 1  5. COMPENSATORY STEPPING CORRECTION BACKWARD: Lean backward against my hands beyond your backward limits.  When I let go, do whatever is necessary, including a step to avoid a fall. (2) recovers independently  with a single, large step, to avoid a fall   (1) more than one step used to recover equilibrium   (0) No step OR would fall if not caught Score: 1  6. COMPENSATORY STEPPING LATERAL: Lean into my hand beyond your sideways limit.  When I let go, do whatever is necessary, including taking a step, to avoid a fall. Left           Right   (2) recovers independently with 1 step  (crossover or lateral OK)   (1) several steps to recover equilibrium   (0) falls or cannot step Score: 1   SENSORY ORIENTATION 7. STANCE FEET TOGETHER EYES OPEN  firm surface Be as stable and still as possible until I say stop   (2) 30s   (1) < 30 s   (0) unable Score: 2  8. STANCE ON FOAM EYES CLOSED feet together, eyes closed, hands on hips. Be as stable and still as you can until I stay stop.  I will start timing when you close your eyes. Time in seconds:      (2) 30s   (1) <30 s   (0) unable Score: 1  9. INCLINE EYES CLOSED: Stand on incline with feet shoulder width apart, arms down at your sides.  I will start timing when you close your eyes   (2) Stands 30 s and aligns with gravity   (1) stands < 30 s OR aligns with surface   (0) unable Score: 1  DYNAMIC GAIT 10. CHANGE IN GAIT SPEED: begin walking at your normal speed, when I tell you "fast", walk as fast as you can, when I say "slow" walk very slowly   (2) significantly changes walking speed without imbalance   (1) unable to change walking speed or signs of imbalance   (0) unable to achieve significant change in walking speed AND signes of imbalance Score: 1  11. WALK WITH HEAD TURNS HORIZONTAL: begin walking at normal speed, when I say right, turn your head to the right, when I say left turn your head to the left.  Try to keep yourself walking in a straight line   (2) performs head turns with no change in gait speed and good balance   (1) performs head turns with reduction in gait speed   (0)  performs head turns with imbalance Score: 1  12. WALK WITH PIVOT TURNS: begin walking at your normal speed.  When I tell you to "turn and stop", turn as quickly as you can, face the opposite direction and stop.  After the turn, your feet should be close together   (2) turns with feet close FAST in 3 steps with good balance   (1) turns with feet close SLOW >  4 steps with good balance   (0) cannot turn with feet close at any  speed without imbalance Score: 1  13. STEP OVER OBSTACLES:  ( 9 inch height 10 feet away from subject) begin walking at your normal speed.  When you get to the box, step over it, not around it and keep walking   (2) Able to step over box with minimal change of gait speed and good balance   (1) Steps over box but touches box OR displays cautious behavior by slowing gait   (0) Unable to step over box OR steps around box Score: 1                                                                                                                                 TREATMENT DATE:  06/05/2023: Nustep level 5 x5 min with PT present to discuss status ABC Scale Sit to/from x5 3 minute walk test: 648 ft with RPE of 1-2/10 Single leg stance 2x20 sec with UE support of barre, as needed Tandem stance 2x20 sec bilat (each leg leading) Standing on wobble board x1 min with UE support as needed Leg Press (seat at 8) 100# 2x10   05/31/23: Bike L4 x 6 min Leg press seat 8 100# bil 15x; 60# single leg press 10x right/left  Runner's step up 6" 2x10 intermittent UE support Feet apart on foam eyes closed 2x30" Feet together on black foam EC head turns and head nods 2x30" each Heel/toe raise on black foam 2x10 Fwd step off black foam x10 R&L Bwd step off black foam x10 R&L Fwd amb with head turns 3x50' (minor LOBs) Fwd amb with head nods 3x50'  05/29/23: Nustep L5 x 8 min UEs/LEs Staggered stance heel/toe raise 2x10 Standing Gastroc stretch 2x 30" Standing quad stretch x30" Leg press seat 8 90# bil 15x; 50# single leg press 10x right/left  Hip abd red TB 2x10 Hip ext red TB 2x10 Marching red TB 2x10   PATIENT EDUCATION:  Education details: POC; HEP compliance; HEP Person educated: Patient Education method: Explanation, Demonstration, and Handouts Education comprehension: verbalized understanding, returned demonstration, and needs further education  HOME EXERCISE PROGRAM: Access Code: [email or web address] URL:  [email or web address] Date: 06/05/2023 Prepared by: Clydie Braun Charnette Younkin  Exercises - Sit to Stand with Armchair  - 1 x daily - 7 x weekly - 1 sets - 10 reps - Standing Hip Extension with Counter Support  - 1 x daily - 7 x weekly - 2 sets - 10 reps - Standing Hip Abduction with Counter Support  - 1 x daily - 7 x weekly - 2 sets - 10 reps - Heel Raises with Counter Support  - 1 x daily - 7 x weekly - 2 sets - 10 reps - Standing Single Leg Stance with Counter Support  - 1 x daily - 7 x weekly - 2 reps - 30 sec hold - Tandem Stance with Support  -  1 x daily - 7 x weekly - 2 reps - 30 sec hold - Step Sideways with Arms Reaching  - 1 x daily - 7 x weekly - 2 sets - 10 reps - Standing Reach to Side with Weight Shift  - 1 x daily - 7 x weekly - 2 sets - 10 reps - Romberg Stance on Foam Pad with Head Rotation  - 1 x daily - 7 x weekly - 2 sets - 10 reps - Narrow Stance with Eyes Closed and Head Nods on Foam Pad  - 1 x daily - 7 x weekly - 2 sets - 10 reps  ASSESSMENT:  CLINICAL IMPRESSION: Mr Bevins presents to skilled PT reporting increased knee pain today secondary to increased walking yesterday due to attending a friend's retirement party.  Patient admits that he has not been as compliant with his exercises as he should have been.  Patient provided with updated and new printout for exercises and encouraged to improve compliance, especially balance exercises.  Patient with improved score on ABC scale as well as having improved time on 5 times sit to stand.  Patient continues to struggle with single leg stance.  Patient continues to require skilled PT to progress towards goal related activities and improved balance.  OBJECTIVE IMPAIRMENTS: Abnormal gait, decreased activity tolerance, decreased balance, decreased endurance, difficulty walking, decreased strength, and postural dysfunction.     GOALS: Goals reviewed with patient? Yes  SHORT TERM GOALS: Target date: 06/05/2023  Patient will be  independent with initial HEP. Baseline:  Goal status: Met on 4/1  2.  Patient will report > or = to 30% improvement in overall balance confidence since starting PT. Baseline:  Goal status: Met on 06/05/23 (reports 50% improvement)  3.  Patient will be able to complete sit to stand without UE support for improved functional strength. Baseline:  Goal status: Met on 06/05/23    LONG TERM GOALS: Target date: 07/03/2023  Patient will demonstrate independence in advanced HEP. Baseline:  Goal status: Ongoing  2.  Patient will report > or = to 60% improvement in balance confidence since starting PT. Baseline:  Goal status: Ongoing (reported 50% improvement on 06/05/23)   3.  Patient will score < or = to 12 sec on 5STS for improved functional strength. Baseline: 15.12 sec Goal status: Ongoing (see above)  4.  Patient will ambulate > or = to 662ft on for improved community negotiation.  Baseline:  Goal status: Goal Met on 06/05/23  5.  Patient will be able to ascend / descend a curb for improved community negotiation. Baseline:  Goal status: Ongoing     PLAN:  PT FREQUENCY: 2x/week  PT DURATION: 8 weeks  PLANNED INTERVENTIONS: 97164- PT Re-evaluation, 97110-Therapeutic exercises, 97530- Therapeutic activity, 97112- Neuromuscular re-education, 97535- Self Care, 16109- Manual therapy, [phone]- Gait training, [phone]- Canalith repositioning, [id number]- Aquatic Therapy, [phone]- Vasopneumatic device, [id number]- Traction (mechanical), [phone]- Ionotophoresis 4mg /ml Dexamethasone, Balance training, Stair training, Taping, Dry Needling, Joint mobilization, Joint manipulation, Spinal manipulation, Spinal mobilization, Vestibular training, Cryotherapy, and Moist heat  PLAN FOR NEXT SESSION:  leg strengthening (leg press/ step ups/staggered STS); dynamic balance; balance on foam (eyes closed), stepping strategies    Reather Laurence, PT, DPT 06/05/23, 12:37 PM  [facility] [address], [address] [place], [place] [address] Phone # [phone] Fax [phone]

## 2023-06-07 ENCOUNTER — Encounter: Payer: Self-pay | Admitting: Rehabilitative and Restorative Service Providers"

## 2023-06-07 ENCOUNTER — Ambulatory Visit: Admitting: Rehabilitative and Restorative Service Providers"

## 2023-06-07 DIAGNOSIS — R2681 Unsteadiness on feet: Secondary | ICD-10-CM | POA: Diagnosis not present

## 2023-06-07 DIAGNOSIS — M6281 Muscle weakness (generalized): Secondary | ICD-10-CM | POA: Diagnosis not present

## 2023-06-07 DIAGNOSIS — R2689 Other abnormalities of gait and mobility: Secondary | ICD-10-CM | POA: Diagnosis not present

## 2023-06-07 DIAGNOSIS — R293 Abnormal posture: Secondary | ICD-10-CM

## 2023-06-07 NOTE — Therapy (Signed)
 OUTPATIENT PHYSICAL THERAPY TREATMENT   Patient Name: RUPERT AZZARA MRN: [id number] DOB:03-16-1950, 73 y.o., male Today's Date: 06/07/2023   END OF SESSION:  PT End of Session - 06/07/23 1126     Visit Number 9    Date for PT Re-Evaluation 07/03/23    Authorization Type [employer] MCR    Authorization Time Period 05/09/2023 - 07/04/2023 (auth sent, pending approval)    Authorization - Visit Number 1    Progress Note Due on Visit 18    PT Start Time 1123    PT Stop Time 1146    PT Time Calculation (min) 23 min    Activity Tolerance Patient tolerated treatment well    Behavior During Therapy WFL for tasks assessed/performed              Past Medical History:  Diagnosis Date   Cancer (HCC)    Skin Cancer - Scalp, Right neck, Left Hand   Dysrhythmia    A. Fib while hospitalized June 2024   Heart murmur    History of nonmelanoma skin cancer    Hypercholesteremia    Hypertension    Pneumonia 08/2022   Trigeminal neuralgia    Past Surgical History:  Procedure Laterality Date   ATRIAL FIBRILLATION ABLATION N/A 05/24/2023   Procedure: ATRIAL FIBRILLATION ABLATION;  Surgeon: Maurice Small, MD;  Location: [facility];  Service: Cardiovascular;  Laterality: N/A;   CATARACT EXTRACTION W/ INTRAOCULAR LENS IMPLANT Bilateral    CLIPPING OF ATRIAL APPENDAGE Left 12/08/2022   Procedure: CLIPPING OF ATRIAL APPENDAGE (LAA) USING 50 MM ATRICLIP;  Surgeon: Eugenio Hoes, MD;  Location: [facility] OR;  Service: Open Heart Surgery;  Laterality: Left;   INGUINAL HERNIA REPAIR  11/07/2011   Procedure: LAPAROSCOPIC INGUINAL HERNIA;  Surgeon: Atilano Ina, MD,FACS;  Location: [facility];  Service: General;  Laterality: Left;   MAZE N/A 12/08/2022   Procedure: MAZE;  Surgeon: Eugenio Hoes, MD;  Location: [facility] OR;  Service: Open Heart Surgery;  Laterality: N/A;   MITRAL VALVE REPAIR N/A 12/08/2022   Procedure: MITRAL VALVE REPAIR (MVR) USING 34 MM SIMULUS SEMI- RIGID ANNULOPLASTY BAND;  Surgeon: Eugenio Hoes, MD;  Location: [facility] OR;  Service: Open Heart Surgery;  Laterality: N/A;   RETINAL DETACHMENT SURGERY  03/07/1995   RIGHT/LEFT HEART CATH AND CORONARY ANGIOGRAPHY N/A 10/12/2022   Procedure: RIGHT/LEFT HEART CATH AND CORONARY ANGIOGRAPHY;  Surgeon: [name], MD;  Location: [facility];  Service: Cardiovascular;  Laterality: N/A;   SKIN CANCER EXCISION  2012, 1999   basal cell - neck and hand   TEE WITHOUT CARDIOVERSION N/A 10/16/2022   Procedure: TRANSESOPHAGEAL ECHOCARDIOGRAM;  Surgeon: Christell Constant, MD;  Location: [facility];  Service: Cardiovascular;  Laterality: N/A;   TEE WITHOUT CARDIOVERSION N/A 12/08/2022   Procedure: TRANSESOPHAGEAL ECHOCARDIOGRAM;  Surgeon: Eugenio Hoes, MD;  Location: [facility] OR;  Service: Open Heart Surgery;  Laterality: N/A;   Patient Active Problem List   Diagnosis Date Noted   S/P MVR (mitral valve repair) 12/08/2022   Protein-calorie malnutrition, severe 08/19/2022   Alcohol withdrawal (HCC) 08/14/2022   Acute metabolic encephalopathy 08/14/2022   Paroxysmal atrial fibrillation with RVR (HCC) 08/14/2022   Nonrheumatic mitral valve regurgitation 08/14/2022   Mitral valve prolapse 08/14/2022   PNA (pneumonia) 08/12/2022   Severe sepsis (HCC) 08/12/2022   HLD (hyperlipidemia) 08/12/2022   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   Trigeminal neuralgia of left side of face 07/15/2019   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  Patient denies pain today.  PERTINENT HISTORY: Skin cancer; HTN ; open heart surgery 2023  PAIN:  Are you having pain? No  PRECAUTIONS: None  WEIGHT BEARING RESTRICTIONS: No  FALLS:  Has patient fallen in last 6 months? No  LIVING ENVIRONMENT: Lives with: lives with their spouse Lives in: House/apartment Stairs: Yes: Internal: down to basement but rarely uses them steps; bilateral but cannot reach both and External: 8 steps; can reach both Has following equipment at home: Dan Humphreys - 2 wheeled  OCCUPATION: Retired; Environmental health practitioner in pharmacology   PLOF: Independent, Independent with basic ADLs, Independent with household mobility without device, Independent with community mobility without device, Independent with gait, and Independent with transfers  PATIENT GOALS: Strengthening his legs and working on balance  NEXT MD VISIT: PRN  OBJECTIVE:  Note: Objective measures were completed at Evaluation unless otherwise noted.  PATIENT SURVEYS:  Eval:  ABC scale 71% moderate level of physical functioning 06/05/2023:  Total ABC score: 1540 / 1600 = 96.3 %  POSTURE: rounded shoulders and forward head   LOWER EXTREMITY MMT: Grossly 4/5    FUNCTIONAL TESTS:  Eval: 5 times sit to stand: 15.12 sec with UE support Timed up and go (TUG): 9.49sec 3 minute walk test: 561 RPE:0-1 SL Balance Rt 12 sec  Lt 8 sec with finger touch on counter; < 2 sec with no UE support  06/05/2023: 5 times sit to stand: 13.25 sec with only occasional pushing through thighs with forearms 3 minute walk test: 648 ft with RPE of 1-2/10 Single Leg Stance:  right- 2.53 sec, left- 4.16 sec  GAIT: Distance walked: 561FT Assistive device utilized: None Level of assistance: Complete Independence Comments: Wide BOS; decreased cadence  DYNAMIC GAIT INDEX: 3/6: 18/24  MINI-BESTest: Balance Evaluation Systems Test ANTICIPATORY: SIT TO  STAND: (2) normal without use of hands and stabilizes independently     (1) Moderate comes to stand WITH use of hands on 1st attempt    (0) Severe: unable to stand up from chair without assistance OR needs several attempts with use of   hands Score: 2  2. RISE TO TOES: feet shoulder width apart. Hands on hips.  Rise as high as you can onto your toes. Try to hold this pose for 3 sec                          (2) stable for 3s with maximum height    (1) heels up, but not full range (smaller than when holding hands) OR noticeable instability for 3 sec                           (0) < or equat to 3 s  Score: 1  REACTIVE POSTURAL CONTROL 4. COMPENSATORY STEPPING CORRECTION FORWARD stand with feet shoulder width apart, arms at your  sides. Lean forward against my hands beyond your forward limits.  When I let go, do whatever is necessary, including taking a step, to avoid a fall   (2) recovers independently  with a single, large step, to avoid a fall   (1) more than one step used to recover equilibrium   (0) No step OR would fall if not caught Score: 1  5. COMPENSATORY STEPPING CORRECTION BACKWARD: Lean backward against my hands beyond your backward limits.  When I let go, do whatever is necessary, including a step to avoid a fall. (2) recovers independently  with a single, large step, to avoid a fall   (1) more than one step used to recover equilibrium   (0) No step OR would fall if not caught Score: 1  6. COMPENSATORY STEPPING LATERAL: Lean into my hand beyond your sideways limit.  When I let go, do whatever is necessary, including taking a step, to avoid a fall. Left           Right   (2) recovers independently with 1 step (crossover or lateral OK)   (1) several steps to recover equilibrium   (0) falls or cannot step Score: 1   SENSORY ORIENTATION 7. STANCE FEET TOGETHER EYES OPEN  firm surface Be as stable and still as possible until I say stop   (2) 30s   (1) < 30 s   (0) unable Score:  2  8. STANCE ON FOAM EYES CLOSED feet together, eyes closed, hands on hips. Be as stable and still as you can until I stay stop.  I will start timing when you close your eyes. Time in seconds:      (2) 30s   (1) <30 s   (0) unable Score: 1  9. INCLINE EYES CLOSED: Stand on incline with feet shoulder width apart, arms down at your sides.  I will start timing when you close your eyes   (2) Stands 30 s and aligns with gravity   (1) stands < 30 s OR aligns with surface   (0) unable Score: 1  DYNAMIC GAIT 10. CHANGE IN GAIT SPEED: begin walking at your normal speed, when I tell you "fast", walk as fast as you can, when I say "slow" walk very slowly   (2) significantly changes walking speed without imbalance   (1) unable to change walking speed or signs of imbalance   (0) unable to achieve significant change in walking speed AND signes of imbalance Score: 1  11. WALK WITH HEAD TURNS HORIZONTAL: begin walking at normal speed, when I say right, turn your head to the right, when I say left turn your head to the left.  Try to keep yourself walking in a straight line   (2) performs head turns with no change in gait speed and good balance   (1) performs head turns with reduction in gait speed   (0)  performs head turns with imbalance Score: 1  12. WALK WITH PIVOT TURNS: begin walking at your normal speed.  When I tell you to "turn and stop", turn as quickly as you can, face the opposite direction and stop.  After the turn, your feet should be close together   (2) turns with feet close FAST in 3 steps with good balance   (1) turns with feet close SLOW > 4 steps with good balance   (0) cannot turn with feet close at any speed without imbalance Score: 1  13. STEP OVER OBSTACLES:  (  9 inch height 10 feet away from subject) begin walking at your normal speed.  When you get to the box, step over it, not around it and keep walking   (2) Able to step over box with minimal change of gait speed and good  balance   (1) Steps over box but touches box OR displays cautious behavior by slowing gait   (0) Unable to step over box OR steps around box Score: 1                                                                                                                                 TREATMENT DATE:  06/07/2023: Nustep level 5 x4 min with PT present to discuss status Tandem gait on AirEx beam in parallel bars with UE support, as needed.  Down and back x3 Side stepping on AirEx beam in parallel bars with UE support, as needed.  Down and back x3 FWD step ups onto bosu in parallel bars with UE support, as needed.  X10 bilat Side step up and over bosu parallel bars with UE support, as needed.  X10 bilat Static standing on flat side of bosu 2x1 min with UE support, as needed  Standing marching on foam pad with unilateral UE support 2x10   06/05/2023: Nustep level 5 x5 min with PT present to discuss status ABC Scale Sit to/from x5 3 minute walk test: 648 ft with RPE of 1-2/10 Single leg stance 2x20 sec with UE support of barre, as needed Tandem stance 2x20 sec bilat (each leg leading) Standing on wobble board x1 min with UE support as needed Leg Press (seat at 8) 100# 2x10   05/31/23: Bike L4 x 6 min Leg press seat 8 100# bil 15x; 60# single leg press 10x right/left  Runner's step up 6" 2x10 intermittent UE support Feet apart on foam eyes closed 2x30" Feet together on black foam EC head turns and head nods 2x30" each Heel/toe raise on black foam 2x10 Fwd step off black foam x10 R&L Bwd step off black foam x10 R&L Fwd amb with head turns 3x50' (minor LOBs) Fwd amb with head nods 3x50'  05/29/23: Nustep L5 x 8 min UEs/LEs Staggered stance heel/toe raise 2x10 Standing Gastroc stretch 2x 30" Standing quad stretch x30" Leg press seat 8 90# bil 15x; 50# single leg press 10x right/left  Hip abd red TB 2x10 Hip ext red TB 2x10 Marching red TB 2x10   PATIENT EDUCATION:  Education details:  POC; HEP compliance; HEP Person educated: Patient Education method: Explanation, Demonstration, and Handouts Education comprehension: verbalized understanding, returned demonstration, and needs further education  HOME EXERCISE PROGRAM: Access Code: [email or web address] URL: [email or web address] Date: 06/05/2023 Prepared by: Clydie Braun Estefania Kamiya  Exercises - Sit to Stand with Armchair  - 1 x daily - 7 x weekly - 1 sets - 10 reps - Standing Hip Extension with Counter Support  - 1 x daily - 7 x weekly -  2 sets - 10 reps - Standing Hip Abduction with Counter Support  - 1 x daily - 7 x weekly - 2 sets - 10 reps - Heel Raises with Counter Support  - 1 x daily - 7 x weekly - 2 sets - 10 reps - Standing Single Leg Stance with Counter Support  - 1 x daily - 7 x weekly - 2 reps - 30 sec hold - Tandem Stance with Support  - 1 x daily - 7 x weekly - 2 reps - 30 sec hold - Step Sideways with Arms Reaching  - 1 x daily - 7 x weekly - 2 sets - 10 reps - Standing Reach to Side with Weight Shift  - 1 x daily - 7 x weekly - 2 sets - 10 reps - Romberg Stance on Foam Pad with Head Rotation  - 1 x daily - 7 x weekly - 2 sets - 10 reps - Narrow Stance with Eyes Closed and Head Nods on Foam Pad  - 1 x daily - 7 x weekly - 2 sets - 10 reps  ASSESSMENT:  CLINICAL IMPRESSION: Mr Bushart presents to skilled PT reporting no new pain.  Patient with great participation throughout and was able to progress with balance exercises during session.  Patient requires UE support throughout higher level balance tasks throughout.  Patient without any reports of falling and did not have any recovery period throughout session.  Patient continues to require skilled PT to progress towards goal related activities.  OBJECTIVE IMPAIRMENTS: Abnormal gait, decreased activity tolerance, decreased balance, decreased endurance, difficulty walking, decreased strength, and postural dysfunction.     GOALS: Goals reviewed with patient?  Yes  SHORT TERM GOALS: Target date: 06/05/2023  Patient will be independent with initial HEP. Baseline:  Goal status: Met on 4/1  2.  Patient will report > or = to 30% improvement in overall balance confidence since starting PT. Baseline:  Goal status: Met on 06/05/23 (reports 50% improvement)  3.  Patient will be able to complete sit to stand without UE support for improved functional strength. Baseline:  Goal status: Met on 06/05/23    LONG TERM GOALS: Target date: 07/03/2023  Patient will demonstrate independence in advanced HEP. Baseline:  Goal status: Ongoing  2.  Patient will report > or = to 60% improvement in balance confidence since starting PT. Baseline:  Goal status: Ongoing (reported 50% improvement on 06/05/23)   3.  Patient will score < or = to 12 sec on 5STS for improved functional strength. Baseline: 15.12 sec Goal status: Ongoing (see above)  4.  Patient will ambulate > or = to 620ft on for improved community negotiation.  Baseline:  Goal status: Goal Met on 06/05/23  5.  Patient will be able to ascend / descend a curb for improved community negotiation. Baseline:  Goal status: Ongoing     PLAN:  PT FREQUENCY: 2x/week  PT DURATION: 8 weeks  PLANNED INTERVENTIONS: [id number]- PT Re-evaluation, 97110-Therapeutic exercises, 97530- Therapeutic activity, [id number]- Neuromuscular re-education, 97535- Self Care, 29562- Manual therapy, [phone]- Gait training, [phone]- Canalith repositioning, [id number]- Aquatic Therapy, [phone]- Vasopneumatic device, [id number]- Traction (mechanical), [phone]- Ionotophoresis 4mg /ml Dexamethasone, Balance training, Stair training, Taping, Dry Needling, Joint mobilization, Joint manipulation, Spinal manipulation, Spinal mobilization, Vestibular training, Cryotherapy, and Moist heat  PLAN FOR NEXT SESSION:  leg strengthening (leg press/ step ups/staggered STS); dynamic balance; balance on foam (eyes closed), stepping strategies    Reather Laurence, PT,  DPT 06/07/23, 12:31 PM  [facility] [address] [place], [place] [address] Phone # [phone] Fax [phone]

## 2023-06-12 ENCOUNTER — Ambulatory Visit: Admitting: Rehabilitative and Restorative Service Providers"

## 2023-06-12 ENCOUNTER — Encounter: Payer: Self-pay | Admitting: Rehabilitative and Restorative Service Providers"

## 2023-06-12 DIAGNOSIS — R2681 Unsteadiness on feet: Secondary | ICD-10-CM

## 2023-06-12 DIAGNOSIS — R293 Abnormal posture: Secondary | ICD-10-CM | POA: Diagnosis not present

## 2023-06-12 DIAGNOSIS — R2689 Other abnormalities of gait and mobility: Secondary | ICD-10-CM

## 2023-06-12 DIAGNOSIS — M6281 Muscle weakness (generalized): Secondary | ICD-10-CM | POA: Diagnosis not present

## 2023-06-12 NOTE — Therapy (Signed)
 OUTPATIENT PHYSICAL THERAPY TREATMENT   Patient Name: James Ibarra MRN: [id number] DOB:05-Dec-1950, 73 y.o., male Today's Date: 06/12/2023   END OF SESSION:  PT End of Session - 06/12/23 1150     Visit Number 10    Date for PT Re-Evaluation 07/03/23    Authorization Type [employer] MCR    Authorization Time Period 05/09/2023 - 07/04/2023 (auth sent, pending approval)    Authorization - Visit Number 2    Progress Note Due on Visit 18    PT Start Time 1146    PT Stop Time 1225    PT Time Calculation (min) 39 min    Activity Tolerance Patient tolerated treatment well    Behavior During Therapy WFL for tasks assessed/performed              Past Medical History:  Diagnosis Date   Cancer (HCC)    Skin Cancer - Scalp, Right neck, Left Hand   Dysrhythmia    A. Fib while hospitalized June 2024   Heart murmur    History of nonmelanoma skin cancer    Hypercholesteremia    Hypertension    Pneumonia 08/2022   Trigeminal neuralgia    Past Surgical History:  Procedure Laterality Date   ATRIAL FIBRILLATION ABLATION N/A 05/24/2023   Procedure: ATRIAL FIBRILLATION ABLATION;  Surgeon: Maurice Small, MD;  Location: [facility];  Service: Cardiovascular;  Laterality: N/A;   CATARACT EXTRACTION W/ INTRAOCULAR LENS IMPLANT Bilateral    CLIPPING OF ATRIAL APPENDAGE Left 12/08/2022   Procedure: CLIPPING OF ATRIAL APPENDAGE (LAA) USING 50 MM ATRICLIP;  Surgeon: Eugenio Hoes, MD;  Location: [facility] OR;  Service: Open Heart Surgery;  Laterality: Left;   INGUINAL HERNIA REPAIR  11/07/2011   Procedure: LAPAROSCOPIC INGUINAL HERNIA;  Surgeon: Atilano Ina, MD,FACS;  Location: [facility];  Service: General;  Laterality: Left;   MAZE N/A 12/08/2022   Procedure: MAZE;  Surgeon: Eugenio Hoes, MD;  [facility];  Service: Open Heart Surgery;  Laterality: N/A;   MITRAL VALVE REPAIR N/A 12/08/2022   Procedure: MITRAL VALVE REPAIR (MVR) USING 34 MM SIMULUS SEMI- RIGID ANNULOPLASTY BAND;  Surgeon:  Eugenio Hoes, MD;  Location: [facility] OR;  Service: Open Heart Surgery;  Laterality: N/A;   RETINAL DETACHMENT SURGERY  03/07/1995   RIGHT/LEFT HEART CATH AND CORONARY ANGIOGRAPHY N/A 10/12/2022   Procedure: RIGHT/LEFT HEART CATH AND CORONARY ANGIOGRAPHY;  Surgeon: [name], MD;  Location: [facility];  Service: Cardiovascular;  Laterality: N/A;   SKIN CANCER EXCISION  2012, 1999   basal cell - neck and hand   TEE WITHOUT CARDIOVERSION N/A 10/16/2022   Procedure: TRANSESOPHAGEAL ECHOCARDIOGRAM;  Surgeon: Christell Constant, MD;  Location: [facility];  Service: Cardiovascular;  Laterality: N/A;   TEE WITHOUT CARDIOVERSION N/A 12/08/2022   Procedure: TRANSESOPHAGEAL ECHOCARDIOGRAM;  Surgeon: Eugenio Hoes, MD;  Location: [facility] OR;  Service: Open Heart Surgery;  Laterality: N/A;   Patient Active Problem List   Diagnosis Date Noted   S/P MVR (mitral valve repair) 12/08/2022   Protein-calorie malnutrition, severe 08/19/2022   Alcohol withdrawal (HCC) 08/14/2022   Acute metabolic encephalopathy 08/14/2022   Paroxysmal atrial fibrillation with RVR (HCC) 08/14/2022   Nonrheumatic mitral valve regurgitation 08/14/2022   Mitral valve prolapse 08/14/2022   PNA (pneumonia) 08/12/2022   Severe sepsis (HCC) 08/12/2022   HLD (hyperlipidemia) 08/12/2022   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   Trigeminal neuralgia of left side of face 07/15/2019   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  No new complaints.  PERTINENT HISTORY: Skin cancer; HTN ; open heart surgery 2023  PAIN:  Are you having pain? No  PRECAUTIONS: None  WEIGHT BEARING RESTRICTIONS: No  FALLS:  Has patient fallen in last 6 months? No  LIVING ENVIRONMENT: Lives with: lives with their spouse Lives in: House/apartment Stairs: Yes: Internal: down to basement but rarely uses them steps; bilateral but cannot reach both and External: 8 steps; can reach both Has following equipment at home: Dan Humphreys - 2 wheeled  OCCUPATION: Retired; Environmental health practitioner in pharmacology   PLOF: Independent, Independent with basic ADLs, Independent with household mobility without device, Independent with community mobility without device, Independent with gait, and Independent with transfers  PATIENT GOALS: Strengthening his legs and working on balance  NEXT MD VISIT: PRN  OBJECTIVE:  Note: Objective measures were completed at Evaluation unless otherwise noted.  PATIENT SURVEYS:  Eval:  ABC scale 71% moderate level of physical functioning 06/05/2023:  Total ABC score: 1540 / 1600 = 96.3 %  POSTURE: rounded shoulders and forward head   LOWER EXTREMITY MMT: Grossly 4/5    FUNCTIONAL TESTS:  Eval: 5 times sit to stand: 15.12 sec with UE support Timed up and go (TUG): 9.49sec 3 minute walk test: 561 RPE:0-1 SL Balance Rt 12 sec  Lt 8 sec with finger touch on counter; < 2 sec with no UE support  06/05/2023: 5 times sit to stand: 13.25 sec with only occasional pushing through thighs with forearms 3 minute walk test: 648 ft with RPE of 1-2/10 Single Leg Stance:  right- 2.53 sec, left- 4.16 sec  GAIT: Distance walked: 561FT Assistive device utilized: None Level of assistance: Complete Independence Comments: Wide BOS; decreased cadence  DYNAMIC GAIT INDEX: 3/6: 18/24  MINI-BESTest: Balance Evaluation Systems Test ANTICIPATORY: SIT TO STAND: (2) normal without use of hands and stabilizes  independently     (1) Moderate comes to stand WITH use of hands on 1st attempt    (0) Severe: unable to stand up from chair without assistance OR needs several attempts with use of   hands Score: 2  2. RISE TO TOES: feet shoulder width apart. Hands on hips.  Rise as high as you can onto your toes. Try to hold this pose for 3 sec                          (2) stable for 3s with maximum height    (1) heels up, but not full range (smaller than when holding hands) OR noticeable instability for 3 sec                           (0) < or equat to 3 s  Score: 1  REACTIVE POSTURAL CONTROL 4. COMPENSATORY STEPPING CORRECTION FORWARD stand with feet shoulder width apart, arms at your sides. Lean forward against my hands beyond your forward limits.  When I  let go, do whatever is necessary, including taking a step, to avoid a fall   (2) recovers independently  with a single, large step, to avoid a fall   (1) more than one step used to recover equilibrium   (0) No step OR would fall if not caught Score: 1  5. COMPENSATORY STEPPING CORRECTION BACKWARD: Lean backward against my hands beyond your backward limits.  When I let go, do whatever is necessary, including a step to avoid a fall. (2) recovers independently  with a single, large step, to avoid a fall   (1) more than one step used to recover equilibrium   (0) No step OR would fall if not caught Score: 1  6. COMPENSATORY STEPPING LATERAL: Lean into my hand beyond your sideways limit.  When I let go, do whatever is necessary, including taking a step, to avoid a fall. Left           Right   (2) recovers independently with 1 step (crossover or lateral OK)   (1) several steps to recover equilibrium   (0) falls or cannot step Score: 1   SENSORY ORIENTATION 7. STANCE FEET TOGETHER EYES OPEN  firm surface Be as stable and still as possible until I say stop   (2) 30s   (1) < 30 s   (0) unable Score: 2  8. STANCE ON FOAM EYES CLOSED feet together,  eyes closed, hands on hips. Be as stable and still as you can until I stay stop.  I will start timing when you close your eyes. Time in seconds:      (2) 30s   (1) <30 s   (0) unable Score: 1  9. INCLINE EYES CLOSED: Stand on incline with feet shoulder width apart, arms down at your sides.  I will start timing when you close your eyes   (2) Stands 30 s and aligns with gravity   (1) stands < 30 s OR aligns with surface   (0) unable Score: 1  DYNAMIC GAIT 10. CHANGE IN GAIT SPEED: begin walking at your normal speed, when I tell you "fast", walk as fast as you can, when I say "slow" walk very slowly   (2) significantly changes walking speed without imbalance   (1) unable to change walking speed or signs of imbalance   (0) unable to achieve significant change in walking speed AND signes of imbalance Score: 1  11. WALK WITH HEAD TURNS HORIZONTAL: begin walking at normal speed, when I say right, turn your head to the right, when I say left turn your head to the left.  Try to keep yourself walking in a straight line   (2) performs head turns with no change in gait speed and good balance   (1) performs head turns with reduction in gait speed   (0)  performs head turns with imbalance Score: 1  12. WALK WITH PIVOT TURNS: begin walking at your normal speed.  When I tell you to "turn and stop", turn as quickly as you can, face the opposite direction and stop.  After the turn, your feet should be close together   (2) turns with feet close FAST in 3 steps with good balance   (1) turns with feet close SLOW > 4 steps with good balance   (0) cannot turn with feet close at any speed without imbalance Score: 1  13. STEP OVER OBSTACLES:  ( 9 inch height 10 feet away from subject) begin walking at your normal  speed.  When you get to the box, step over it, not around it and keep walking   (2) Able to step over box with minimal change of gait speed and good balance   (1) Steps over box but touches box OR  displays cautious behavior by slowing gait   (0) Unable to step over box OR steps around box Score: 1                                                                                                                                 TREATMENT DATE:  06/12/2023: Nustep level 5 x6 min with PT present to discuss status Standing hamstring stretch with 3rd step 2x20 sec bilat Standing rocker board for DF/PF x2 min Standing on wobble board x2 min with slight finger hold to improved balance. Tandem gait on AirEx beam in parallel bars with UE support, as needed.  Down and back x3 Side stepping on AirEx beam in parallel bars with UE support, as needed.  Down and back x3 FWD step ups onto bosu in parallel bars with UE support, as needed.  X10 bilat Side step up and over bosu parallel bars with UE support, as needed.  X10 bilat Marching on trampoline x1 min Marching on trampoline with head rotations x1 min Leg Press (seat at 8) 100# 2x10   06/07/2023: Nustep level 5 x4 min with PT present to discuss status Tandem gait on AirEx beam in parallel bars with UE support, as needed.  Down and back x3 Side stepping on AirEx beam in parallel bars with UE support, as needed.  Down and back x3 FWD step ups onto bosu in parallel bars with UE support, as needed.  X10 bilat Side step up and over bosu parallel bars with UE support, as needed.  X10 bilat Static standing on flat side of bosu 2x1 min with UE support, as needed  Standing marching on foam pad with unilateral UE support 2x10   06/05/2023: Nustep level 5 x5 min with PT present to discuss status ABC Scale Sit to/from x5 3 minute walk test: 648 ft with RPE of 1-2/10 Single leg stance 2x20 sec with UE support of barre, as needed Tandem stance 2x20 sec bilat (each leg leading) Standing on wobble board x1 min with UE support as needed Leg Press (seat at 8) 100# 2x10     PATIENT EDUCATION:  Education details: POC; HEP compliance; HEP Person educated:  Patient Education method: Explanation, Demonstration, and Handouts Education comprehension: verbalized understanding, returned demonstration, and needs further education  HOME EXERCISE PROGRAM: Access Code: [email or web address] URL: [email or web address] Date: 06/05/2023 Prepared by: Clydie Braun Allisyn Kunz  Exercises - Sit to Stand with Armchair  - 1 x daily - 7 x weekly - 1 sets - 10 reps - Standing Hip Extension with Counter Support  - 1 x daily - 7 x weekly - 2 sets - 10 reps - Standing Hip Abduction with Counter Support  -  1 x daily - 7 x weekly - 2 sets - 10 reps - Heel Raises with Counter Support  - 1 x daily - 7 x weekly - 2 sets - 10 reps - Standing Single Leg Stance with Counter Support  - 1 x daily - 7 x weekly - 2 reps - 30 sec hold - Tandem Stance with Support  - 1 x daily - 7 x weekly - 2 reps - 30 sec hold - Step Sideways with Arms Reaching  - 1 x daily - 7 x weekly - 2 sets - 10 reps - Standing Reach to Side with Weight Shift  - 1 x daily - 7 x weekly - 2 sets - 10 reps - Romberg Stance on Foam Pad with Head Rotation  - 1 x daily - 7 x weekly - 2 sets - 10 reps - Narrow Stance with Eyes Closed and Head Nods on Foam Pad  - 1 x daily - 7 x weekly - 2 sets - 10 reps  ASSESSMENT:  CLINICAL IMPRESSION: Mr Mcalexander presents to skilled PT reporting no new pain, but states some fatigue from attending a rally over the weekend.  Patient able to progress with flexibility exercises today secondary to being on his feet for a long time this weekend.  Patient continues to progress with balance exercises, requiring UE support and close SBA during exercises.  Patient with some difficulty coordinating head turns with marching.  Patient continues to require skilled PT to progress towards goal related activities.  OBJECTIVE IMPAIRMENTS: Abnormal gait, decreased activity tolerance, decreased balance, decreased endurance, difficulty walking, decreased strength, and postural dysfunction.      GOALS: Goals reviewed with patient? Yes  SHORT TERM GOALS: Target date: 06/05/2023  Patient will be independent with initial HEP. Baseline:  Goal status: Met on 4/1  2.  Patient will report > or = to 30% improvement in overall balance confidence since starting PT. Baseline:  Goal status: Met on 06/05/23 (reports 50% improvement)  3.  Patient will be able to complete sit to stand without UE support for improved functional strength. Baseline:  Goal status: Met on 06/05/23    LONG TERM GOALS: Target date: 07/03/2023  Patient will demonstrate independence in advanced HEP. Baseline:  Goal status: Ongoing  2.  Patient will report > or = to 60% improvement in balance confidence since starting PT. Baseline:  Goal status: Ongoing (reported 50% improvement on 06/05/23)   3.  Patient will score < or = to 12 sec on 5STS for improved functional strength. Baseline: 15.12 sec Goal status: Ongoing (see above)  4.  Patient will ambulate > or = to 667ft on for improved community negotiation.  Baseline:  Goal status: Goal Met on 06/05/23  5.  Patient will be able to ascend / descend a curb for improved community negotiation. Baseline:  Goal status: Ongoing     PLAN:  PT FREQUENCY: 2x/week  PT DURATION: 8 weeks  PLANNED INTERVENTIONS: 97164- PT Re-evaluation, 97110-Therapeutic exercises, 97530- Therapeutic activity, [id number]- Neuromuscular re-education, 97535- Self Care, [id number]- Manual therapy, [id number]- Gait training, [phone]- Canalith repositioning, [id number]- Aquatic Therapy, [phone]- Vasopneumatic device, [id number]- Traction (mechanical), [phone]- Ionotophoresis 4mg /ml Dexamethasone, Balance training, Stair training, Taping, Dry Needling, Joint mobilization, Joint manipulation, Spinal manipulation, Spinal mobilization, Vestibular training, Cryotherapy, and Moist heat  PLAN FOR NEXT SESSION:  leg strengthening (leg press/ step ups/staggered STS); dynamic balance; balance on foam (eyes closed),  stepping strategies    Reather Laurence, PT, DPT 06/12/23, 12:34 PM  [facility] [address], [address] [place], [place] [address] Phone # [phone] Fax [phone]

## 2023-06-14 ENCOUNTER — Ambulatory Visit: Admitting: Rehabilitative and Restorative Service Providers"

## 2023-06-14 ENCOUNTER — Encounter: Payer: Self-pay | Admitting: Rehabilitative and Restorative Service Providers"

## 2023-06-14 DIAGNOSIS — R293 Abnormal posture: Secondary | ICD-10-CM | POA: Diagnosis not present

## 2023-06-14 DIAGNOSIS — R2689 Other abnormalities of gait and mobility: Secondary | ICD-10-CM

## 2023-06-14 DIAGNOSIS — R2681 Unsteadiness on feet: Secondary | ICD-10-CM

## 2023-06-14 DIAGNOSIS — M6281 Muscle weakness (generalized): Secondary | ICD-10-CM

## 2023-06-14 NOTE — Therapy (Signed)
 OUTPATIENT PHYSICAL THERAPY TREATMENT   Patient Name: James Ibarra MRN: [id number] DOB:11-Jul-1950, 73 y.o., male Today's Date: 06/14/2023   END OF SESSION:  PT End of Session - 06/14/23 1149     Visit Number 11    Date for PT Re-Evaluation 07/03/23    Authorization Type [employer] MCR    Authorization Time Period 06/05/2023 - 07/31/2023    Authorization - Visit Number 3    Authorization - Number of Visits 8    Progress Note Due on Visit 18    PT Start Time 1146    PT Stop Time 1225    PT Time Calculation (min) 39 min    Activity Tolerance Patient tolerated treatment well    Behavior During Therapy WFL for tasks assessed/performed              Past Medical History:  Diagnosis Date   Cancer (HCC)    Skin Cancer - Scalp, Right neck, Left Hand   Dysrhythmia    A. Fib while hospitalized June 2024   Heart murmur    History of nonmelanoma skin cancer    Hypercholesteremia    Hypertension    Pneumonia 08/2022   Trigeminal neuralgia    Past Surgical History:  Procedure Laterality Date   ATRIAL FIBRILLATION ABLATION N/A 05/24/2023   Procedure: ATRIAL FIBRILLATION ABLATION;  Surgeon: Maurice Small, MD;  Location: [facility];  Service: Cardiovascular;  Laterality: N/A;   CATARACT EXTRACTION W/ INTRAOCULAR LENS IMPLANT Bilateral    CLIPPING OF ATRIAL APPENDAGE Left 12/08/2022   Procedure: CLIPPING OF ATRIAL APPENDAGE (LAA) USING 50 MM ATRICLIP;  Surgeon: Eugenio Hoes, MD;  Location: [facility] OR;  Service: Open Heart Surgery;  Laterality: Left;   INGUINAL HERNIA REPAIR  11/07/2011   Procedure: LAPAROSCOPIC INGUINAL HERNIA;  Surgeon: Atilano Ina, MD,FACS;  Location: [facility];  Service: General;  Laterality: Left;   MAZE N/A 12/08/2022   Procedure: MAZE;  Surgeon: Eugenio Hoes, MD;  Location: [facility] OR;  Service: Open Heart Surgery;  Laterality: N/A;   MITRAL VALVE REPAIR N/A 12/08/2022   Procedure: MITRAL VALVE REPAIR (MVR) USING 34 MM SIMULUS SEMI- RIGID ANNULOPLASTY BAND;   Surgeon: Eugenio Hoes, MD;  Location: [facility];  Service: Open Heart Surgery;  Laterality: N/A;   RETINAL DETACHMENT SURGERY  03/07/1995   RIGHT/LEFT HEART CATH AND CORONARY ANGIOGRAPHY N/A 10/12/2022   Procedure: RIGHT/LEFT HEART CATH AND CORONARY ANGIOGRAPHY;  Surgeon: [name], MD;  Location: [facility];  Service: Cardiovascular;  Laterality: N/A;   SKIN CANCER EXCISION  2012, 1999   basal cell - neck and hand   TEE WITHOUT CARDIOVERSION N/A 10/16/2022   Procedure: TRANSESOPHAGEAL ECHOCARDIOGRAM;  Surgeon: Christell Constant, MD;  Location: [facility];  Service: Cardiovascular;  Laterality: N/A;   TEE WITHOUT CARDIOVERSION N/A 12/08/2022   Procedure: TRANSESOPHAGEAL ECHOCARDIOGRAM;  Surgeon: Eugenio Hoes, MD;  Location: [facility] OR;  Service: Open Heart Surgery;  Laterality: N/A;   Patient Active Problem List   Diagnosis Date Noted   S/P MVR (mitral valve repair) 12/08/2022   Protein-calorie malnutrition, severe 08/19/2022   Alcohol withdrawal (HCC) 08/14/2022   Acute metabolic encephalopathy 08/14/2022   Paroxysmal atrial fibrillation with RVR (HCC) 08/14/2022   Nonrheumatic mitral valve regurgitation 08/14/2022   Mitral valve prolapse 08/14/2022   PNA (pneumonia) 08/12/2022   Severe sepsis (HCC) 08/12/2022   HLD (hyperlipidemia) 08/12/2022   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   Trigeminal neuralgia of left side of face 07/15/2019  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  States that he slept well last night.  PERTINENT HISTORY: Skin cancer; HTN ; open heart surgery 2023  PAIN:  Are you having pain? No  PRECAUTIONS: None  WEIGHT BEARING RESTRICTIONS: No  FALLS:  Has patient fallen in last 6 months? No  LIVING ENVIRONMENT: Lives with: lives with their spouse Lives in: House/apartment Stairs: Yes: Internal: down to basement but rarely uses them steps; bilateral but cannot reach both and External: 8 steps; can reach both Has following equipment at home: Dan Humphreys - 2 wheeled  OCCUPATION: Retired; Environmental health practitioner in pharmacology   PLOF: Independent, Independent with basic ADLs, Independent with household mobility without device, Independent with community mobility without device, Independent with gait, and Independent with transfers  PATIENT GOALS: Strengthening his legs and working on balance  NEXT MD VISIT: PRN  OBJECTIVE:  Note: Objective measures were completed at Evaluation unless otherwise noted.  PATIENT SURVEYS:  Eval:  ABC scale 71% moderate level of physical functioning 06/05/2023:  Total ABC score: 1540 / 1600 = 96.3 %  POSTURE: rounded shoulders and forward head   LOWER EXTREMITY MMT: Grossly 4/5    FUNCTIONAL TESTS:  Eval: 5 times sit to stand: 15.12 sec with UE support Timed up and go (TUG): 9.49sec 3 minute walk test: 561 RPE:0-1 SL Balance Rt 12 sec  Lt 8 sec with finger touch on counter; < 2 sec with no UE support  06/05/2023: 5 times sit to stand: 13.25 sec with only occasional pushing through thighs with forearms 3 minute walk test: 648 ft with RPE of 1-2/10 Single Leg Stance:  right- 2.53 sec, left- 4.16 sec  GAIT: Distance walked: 561FT Assistive device utilized: None Level of assistance: Complete Independence Comments: Wide BOS; decreased cadence  DYNAMIC GAIT INDEX: 3/6: 18/24  MINI-BESTest: Balance Evaluation Systems Test ANTICIPATORY: SIT TO STAND: (2) normal without  use of hands and stabilizes independently     (1) Moderate comes to stand WITH use of hands on 1st attempt    (0) Severe: unable to stand up from chair without assistance OR needs several attempts with use of   hands Score: 2  2. RISE TO TOES: feet shoulder width apart. Hands on hips.  Rise as high as you can onto your toes. Try to hold this pose for 3 sec                          (2) stable for 3s with maximum height    (1) heels up, but not full range (smaller than when holding hands) OR noticeable instability for 3 sec                           (0) < or equat to 3 s  Score: 1  REACTIVE POSTURAL CONTROL 4. COMPENSATORY STEPPING CORRECTION FORWARD stand with feet shoulder width apart, arms at your sides. Lean forward against my  hands beyond your forward limits.  When I let go, do whatever is necessary, including taking a step, to avoid a fall   (2) recovers independently  with a single, large step, to avoid a fall   (1) more than one step used to recover equilibrium   (0) No step OR would fall if not caught Score: 1  5. COMPENSATORY STEPPING CORRECTION BACKWARD: Lean backward against my hands beyond your backward limits.  When I let go, do whatever is necessary, including a step to avoid a fall. (2) recovers independently  with a single, large step, to avoid a fall   (1) more than one step used to recover equilibrium   (0) No step OR would fall if not caught Score: 1  6. COMPENSATORY STEPPING LATERAL: Lean into my hand beyond your sideways limit.  When I let go, do whatever is necessary, including taking a step, to avoid a fall. Left           Right   (2) recovers independently with 1 step (crossover or lateral OK)   (1) several steps to recover equilibrium   (0) falls or cannot step Score: 1   SENSORY ORIENTATION 7. STANCE FEET TOGETHER EYES OPEN  firm surface Be as stable and still as possible until I say stop   (2) 30s   (1) < 30 s   (0) unable Score: 2  8. STANCE ON FOAM  EYES CLOSED feet together, eyes closed, hands on hips. Be as stable and still as you can until I stay stop.  I will start timing when you close your eyes. Time in seconds:      (2) 30s   (1) <30 s   (0) unable Score: 1  9. INCLINE EYES CLOSED: Stand on incline with feet shoulder width apart, arms down at your sides.  I will start timing when you close your eyes   (2) Stands 30 s and aligns with gravity   (1) stands < 30 s OR aligns with surface   (0) unable Score: 1  DYNAMIC GAIT 10. CHANGE IN GAIT SPEED: begin walking at your normal speed, when I tell you "fast", walk as fast as you can, when I say "slow" walk very slowly   (2) significantly changes walking speed without imbalance   (1) unable to change walking speed or signs of imbalance   (0) unable to achieve significant change in walking speed AND signes of imbalance Score: 1  11. WALK WITH HEAD TURNS HORIZONTAL: begin walking at normal speed, when I say right, turn your head to the right, when I say left turn your head to the left.  Try to keep yourself walking in a straight line   (2) performs head turns with no change in gait speed and good balance   (1) performs head turns with reduction in gait speed   (0)  performs head turns with imbalance Score: 1  12. WALK WITH PIVOT TURNS: begin walking at your normal speed.  When I tell you to "turn and stop", turn as quickly as you can, face the opposite direction and stop.  After the turn, your feet should be close together   (2) turns with feet close FAST in 3 steps with good balance   (1) turns with feet close SLOW > 4 steps with good balance   (0) cannot turn with feet close at any speed without imbalance Score: 1  13. STEP OVER OBSTACLES:  ( 9 inch height 10 feet  away from subject) begin walking at your normal speed.  When you get to the box, step over it, not around it and keep walking   (2) Able to step over box with minimal change of gait speed and good balance   (1) Steps  over box but touches box OR displays cautious behavior by slowing gait   (0) Unable to step over box OR steps around box Score: 1                                                                                                                                 TREATMENT DATE:  06/14/2023: Nustep level 5 x6 min with PT present to discuss status Standing hamstring stretch with 3rd step 2x20 sec bilat Tandem gait on AirEx beam in parallel bars with UE support, as needed.  Down and back x3 Side stepping on AirEx beam in parallel bars with UE support, as needed.  Down and back x3 FWD step ups onto bosu in parallel bars with UE support, as needed.  X10 bilat Side step up and over bosu parallel bars with UE support, as needed.  X10 bilat Static standing on flat side of bosu 2x1 min with UE support, as needed  Static standing on flat side of bosu 2x1 min with UE support, as needed  Marching on trampoline x2 min Standing rocker board for DF/PF x1 min Leg Press (seat at 8) 100# 2x10   06/12/2023: Nustep level 5 x6 min with PT present to discuss status Standing hamstring stretch with 3rd step 2x20 sec bilat Standing rocker board for DF/PF x2 min Standing on wobble board x2 min with slight finger hold to improved balance. Tandem gait on AirEx beam in parallel bars with UE support, as needed.  Down and back x3 Side stepping on AirEx beam in parallel bars with UE support, as needed.  Down and back x3 FWD step ups onto bosu in parallel bars with UE support, as needed.  X10 bilat Side step up and over bosu parallel bars with UE support, as needed.  X10 bilat Marching on trampoline x1 min Marching on trampoline with head rotations x1 min Leg Press (seat at 8) 100# 2x10   06/07/2023: Nustep level 5 x4 min with PT present to discuss status Tandem gait on AirEx beam in parallel bars with UE support, as needed.  Down and back x3 Side stepping on AirEx beam in parallel bars with UE support, as needed.  Down  and back x3 FWD step ups onto bosu in parallel bars with UE support, as needed.  X10 bilat Side step up and over bosu parallel bars with UE support, as needed.  X10 bilat Static standing on flat side of bosu 2x1 min with UE support, as needed  Standing marching on foam pad with unilateral UE support 2x10    PATIENT EDUCATION:  Education details: POC; HEP compliance; HEP Person educated: Patient Education method: Explanation, Demonstration,  and Handouts Education comprehension: verbalized understanding, returned demonstration, and needs further education  HOME EXERCISE PROGRAM: Access Code: [email or web address] URL: [email or web address] Date: 06/05/2023 Prepared by: Clydie Braun Schylar Allard  Exercises - Sit to Stand with Armchair  - 1 x daily - 7 x weekly - 1 sets - 10 reps - Standing Hip Extension with Counter Support  - 1 x daily - 7 x weekly - 2 sets - 10 reps - Standing Hip Abduction with Counter Support  - 1 x daily - 7 x weekly - 2 sets - 10 reps - Heel Raises with Counter Support  - 1 x daily - 7 x weekly - 2 sets - 10 reps - Standing Single Leg Stance with Counter Support  - 1 x daily - 7 x weekly - 2 reps - 30 sec hold - Tandem Stance with Support  - 1 x daily - 7 x weekly - 2 reps - 30 sec hold - Step Sideways with Arms Reaching  - 1 x daily - 7 x weekly - 2 sets - 10 reps - Standing Reach to Side with Weight Shift  - 1 x daily - 7 x weekly - 2 sets - 10 reps - Romberg Stance on Foam Pad with Head Rotation  - 1 x daily - 7 x weekly - 2 sets - 10 reps - Narrow Stance with Eyes Closed and Head Nods on Foam Pad  - 1 x daily - 7 x weekly - 2 sets - 10 reps  ASSESSMENT:  CLINICAL IMPRESSION: Mr Maese presents to skilled PT reporting that he is progressing towards goal related activities.  Patient continues to be able to progress with standing balance exercises during clinic.  Patient requires UE support occasionally during advanced balance tasks.  Patient continues to require skilled PT  to progress towards goal related activities and improved balance.  OBJECTIVE IMPAIRMENTS: Abnormal gait, decreased activity tolerance, decreased balance, decreased endurance, difficulty walking, decreased strength, and postural dysfunction.     GOALS: Goals reviewed with patient? Yes  SHORT TERM GOALS: Target date: 06/05/2023  Patient will be independent with initial HEP. Baseline:  Goal status: Met on 4/1  2.  Patient will report > or = to 30% improvement in overall balance confidence since starting PT. Baseline:  Goal status: Met on 06/05/23 (reports 50% improvement)  3.  Patient will be able to complete sit to stand without UE support for improved functional strength. Baseline:  Goal status: Met on 06/05/23    LONG TERM GOALS: Target date: 07/03/2023  Patient will demonstrate independence in advanced HEP. Baseline:  Goal status: Ongoing  2.  Patient will report > or = to 60% improvement in balance confidence since starting PT. Baseline:  Goal status: Ongoing (reported 50% improvement on 06/05/23)   3.  Patient will score < or = to 12 sec on 5STS for improved functional strength. Baseline: 15.12 sec Goal status: Ongoing (see above)  4.  Patient will ambulate > or = to 651ft on for improved community negotiation.  Baseline:  Goal status: Goal Met on 06/05/23  5.  Patient will be able to ascend / descend a curb for improved community negotiation. Baseline:  Goal status: Ongoing     PLAN:  PT FREQUENCY: 2x/week  PT DURATION: 8 weeks  PLANNED INTERVENTIONS: 97164- PT Re-evaluation, 97110-Therapeutic exercises, 97530- Therapeutic activity, [id number]- Neuromuscular re-education, 97535- Self Care, 54098- Manual therapy, [id number]- Gait training, [phone]- Canalith repositioning, [id number]- Aquatic Therapy, 97016- Vasopneumatic device, [id number]- Traction (mechanical), [id number]- Ionotophoresis 4mg /ml Dexamethasone,  Balance training, Stair training, Taping, Dry Needling, Joint mobilization,  Joint manipulation, Spinal manipulation, Spinal mobilization, Vestibular training, Cryotherapy, and Moist heat  PLAN FOR NEXT SESSION:  leg strengthening (leg press/ step ups/staggered STS); dynamic balance; balance on foam (eyes closed), stepping strategies    Reather Laurence, PT, DPT 06/14/23, 1:25 PM  [facility] [address], [address] [place], [place] [address] Phone # [phone] Fax [phone]

## 2023-06-18 DIAGNOSIS — R3915 Urgency of urination: Secondary | ICD-10-CM | POA: Diagnosis not present

## 2023-06-18 DIAGNOSIS — G500 Trigeminal neuralgia: Secondary | ICD-10-CM | POA: Diagnosis not present

## 2023-06-18 DIAGNOSIS — E785 Hyperlipidemia, unspecified: Secondary | ICD-10-CM | POA: Diagnosis not present

## 2023-06-18 DIAGNOSIS — I10 Essential (primary) hypertension: Secondary | ICD-10-CM | POA: Diagnosis not present

## 2023-06-18 DIAGNOSIS — I4891 Unspecified atrial fibrillation: Secondary | ICD-10-CM | POA: Diagnosis not present

## 2023-06-18 DIAGNOSIS — Z0000 Encounter for general adult medical examination without abnormal findings: Secondary | ICD-10-CM | POA: Diagnosis not present

## 2023-06-18 DIAGNOSIS — G4700 Insomnia, unspecified: Secondary | ICD-10-CM | POA: Diagnosis not present

## 2023-06-19 ENCOUNTER — Ambulatory Visit: Admitting: Rehabilitative and Restorative Service Providers"

## 2023-06-19 ENCOUNTER — Encounter: Payer: Self-pay | Admitting: Rehabilitative and Restorative Service Providers"

## 2023-06-19 DIAGNOSIS — R2689 Other abnormalities of gait and mobility: Secondary | ICD-10-CM

## 2023-06-19 DIAGNOSIS — M6281 Muscle weakness (generalized): Secondary | ICD-10-CM | POA: Diagnosis not present

## 2023-06-19 DIAGNOSIS — R293 Abnormal posture: Secondary | ICD-10-CM

## 2023-06-19 DIAGNOSIS — R2681 Unsteadiness on feet: Secondary | ICD-10-CM | POA: Diagnosis not present

## 2023-06-19 NOTE — Therapy (Signed)
 OUTPATIENT PHYSICAL THERAPY TREATMENT   Patient Name: James Ibarra MRN: [id number] DOB:28-Jan-1951, 73 y.o., male Today's Date: 06/19/2023   END OF SESSION:  PT End of Session - 06/19/23 1153     Visit Number 12    Date for PT Re-Evaluation 07/03/23    Authorization Type [employer] MCR    Authorization Time Period 06/05/2023 - 07/31/2023    Authorization - Visit Number 4    Authorization - Number of Visits 8    Progress Note Due on Visit 18    PT Start Time 1150    PT Stop Time 1230    PT Time Calculation (min) 40 min    Activity Tolerance Patient tolerated treatment well    Behavior During Therapy WFL for tasks assessed/performed              Past Medical History:  Diagnosis Date   Cancer (HCC)    Skin Cancer - Scalp, Right neck, Left Hand   Dysrhythmia    A. Fib while hospitalized June 2024   Heart murmur    History of nonmelanoma skin cancer    Hypercholesteremia    Hypertension    Pneumonia 08/2022   Trigeminal neuralgia    Past Surgical History:  Procedure Laterality Date   ATRIAL FIBRILLATION ABLATION N/A 05/24/2023   Procedure: ATRIAL FIBRILLATION ABLATION;  Surgeon: Maurice Small, MD;  Location: [facility];  Service: Cardiovascular;  Laterality: N/A;   CATARACT EXTRACTION W/ INTRAOCULAR LENS IMPLANT Bilateral    CLIPPING OF ATRIAL APPENDAGE Left 12/08/2022   Procedure: CLIPPING OF ATRIAL APPENDAGE (LAA) USING 50 MM ATRICLIP;  Surgeon: Eugenio Hoes, MD;  Location: [facility] OR;  Service: Open Heart Surgery;  Laterality: Left;   INGUINAL HERNIA REPAIR  11/07/2011   Procedure: LAPAROSCOPIC INGUINAL HERNIA;  Surgeon: Atilano Ina, MD,FACS;  Location: [facility];  Service: General;  Laterality: Left;   MAZE N/A 12/08/2022   Procedure: MAZE;  Surgeon: Eugenio Hoes, MD;  [facility];  Service: Open Heart Surgery;  Laterality: N/A;   MITRAL VALVE REPAIR N/A 12/08/2022   Procedure: MITRAL VALVE REPAIR (MVR) USING 34 MM SIMULUS SEMI- RIGID ANNULOPLASTY BAND;   Surgeon: Eugenio Hoes, MD;  Location: [facility] OR;  Service: Open Heart Surgery;  Laterality: N/A;   RETINAL DETACHMENT SURGERY  03/07/1995   RIGHT/LEFT HEART CATH AND CORONARY ANGIOGRAPHY N/A 10/12/2022   Procedure: RIGHT/LEFT HEART CATH AND CORONARY ANGIOGRAPHY;  Surgeon: [name], MD;  Location: [facility];  Service: Cardiovascular;  Laterality: N/A;   SKIN CANCER EXCISION  2012, 1999   basal cell - neck and hand   TEE WITHOUT CARDIOVERSION N/A 10/16/2022   Procedure: TRANSESOPHAGEAL ECHOCARDIOGRAM;  Surgeon: Christell Constant, MD;  Location: [facility];  Service: Cardiovascular;  Laterality: N/A;   TEE WITHOUT CARDIOVERSION N/A 12/08/2022   Procedure: TRANSESOPHAGEAL ECHOCARDIOGRAM;  Surgeon: Eugenio Hoes, MD;  Location: [facility] OR;  Service: Open Heart Surgery;  Laterality: N/A;   Patient Active Problem List   Diagnosis Date Noted   S/P MVR (mitral valve repair) 12/08/2022   Protein-calorie malnutrition, severe 08/19/2022   Alcohol withdrawal (HCC) 08/14/2022   Acute metabolic encephalopathy 08/14/2022   Paroxysmal atrial fibrillation with RVR (HCC) 08/14/2022   Nonrheumatic mitral valve regurgitation 08/14/2022   Mitral valve prolapse 08/14/2022   PNA (pneumonia) 08/12/2022   Severe sepsis (HCC) 08/12/2022   HLD (hyperlipidemia) 08/12/2022   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   Trigeminal neuralgia of left side of face 07/15/2019  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  PERTINENT HISTORY: Skin cancer; HTN ; open heart surgery 2023  PAIN:  Are you having pain? No  PRECAUTIONS: None  WEIGHT BEARING RESTRICTIONS: No  FALLS:  Has patient fallen in last 6 months? No  LIVING ENVIRONMENT: Lives with: lives with their spouse Lives in: House/apartment Stairs: Yes: Internal: down to basement but rarely uses them steps; bilateral but cannot reach both and External: 8 steps; can reach both Has following equipment at home: Otho Blitz - 2 wheeled  OCCUPATION: Retired; Environmental health practitioner in pharmacology   PLOF: Independent, Independent with basic ADLs, Independent with household mobility without device, Independent with community mobility without device, Independent with gait, and Independent with transfers  PATIENT GOALS: Strengthening his legs and working on balance  NEXT MD VISIT: PRN  OBJECTIVE:  Note: Objective measures were completed at Evaluation unless otherwise noted.  PATIENT SURVEYS:  Eval:  ABC scale 71% moderate level of physical functioning 06/05/2023:  Total ABC score: 1540 / 1600 = 96.3 %  POSTURE: rounded shoulders and forward head   LOWER EXTREMITY MMT: Grossly 4/5    FUNCTIONAL TESTS:  Eval: 5 times sit to stand: 15.12 sec with UE support Timed up and go (TUG): 9.49sec 3 minute walk test: 561 RPE:0-1 SL Balance Rt 12 sec  Lt 8 sec with finger touch on counter; < 2 sec with no UE support  06/05/2023: 5 times sit to stand: 13.25 sec with only occasional pushing through thighs with forearms 3 minute walk test: 648 ft with RPE of 1-2/10 Single Leg Stance:  right- 2.53 sec, left- 4.16 sec  06/19/2023: 6 minute walk test:  1182 ft with RPE of 1/10 (age related norms ar 1,999 ft)  GAIT: Distance walked: 561FT Assistive device utilized: None Level of assistance: Complete Independence Comments: Wide BOS; decreased cadence  DYNAMIC GAIT INDEX: 3/6:  18/24  MINI-BESTest: Balance Evaluation Systems Test ANTICIPATORY: SIT TO STAND: (2) normal without use of hands and stabilizes independently     (1) Moderate comes to stand WITH use of hands on 1st attempt    (0) Severe: unable to stand up from chair without assistance OR needs several attempts with use of   hands Score: 2  2. RISE TO TOES: feet shoulder width apart. Hands on hips.  Rise as high as you can onto your toes. Try to hold this pose for 3 sec                          (2) stable for 3s with maximum height    (1) heels up, but not full range (smaller than when holding hands) OR noticeable instability for 3 sec                           (0) < or equat to 3 s  Score: 1  REACTIVE POSTURAL CONTROL  4. COMPENSATORY STEPPING CORRECTION FORWARD stand with feet shoulder width apart, arms at your sides. Lean forward against my hands beyond your forward limits.  When I let go, do whatever is necessary, including taking a step, to avoid a fall   (2) recovers independently  with a single, large step, to avoid a fall   (1) more than one step used to recover equilibrium   (0) No step OR would fall if not caught Score: 1  5. COMPENSATORY STEPPING CORRECTION BACKWARD: Lean backward against my hands beyond your backward limits.  When I let go, do whatever is necessary, including a step to avoid a fall. (2) recovers independently  with a single, large step, to avoid a fall   (1) more than one step used to recover equilibrium   (0) No step OR would fall if not caught Score: 1  6. COMPENSATORY STEPPING LATERAL: Lean into my hand beyond your sideways limit.  When I let go, do whatever is necessary, including taking a step, to avoid a fall. Left           Right   (2) recovers independently with 1 step (crossover or lateral OK)   (1) several steps to recover equilibrium   (0) falls or cannot step Score: 1   SENSORY ORIENTATION 7. STANCE FEET TOGETHER EYES OPEN  firm surface Be as stable and  still as possible until I say stop   (2) 30s   (1) < 30 s   (0) unable Score: 2  8. STANCE ON FOAM EYES CLOSED feet together, eyes closed, hands on hips. Be as stable and still as you can until I stay stop.  I will start timing when you close your eyes. Time in seconds:      (2) 30s   (1) <30 s   (0) unable Score: 1  9. INCLINE EYES CLOSED: Stand on incline with feet shoulder width apart, arms down at your sides.  I will start timing when you close your eyes   (2) Stands 30 s and aligns with gravity   (1) stands < 30 s OR aligns with surface   (0) unable Score: 1  DYNAMIC GAIT 10. CHANGE IN GAIT SPEED: begin walking at your normal speed, when I tell you "fast", walk as fast as you can, when I say "slow" walk very slowly   (2) significantly changes walking speed without imbalance   (1) unable to change walking speed or signs of imbalance   (0) unable to achieve significant change in walking speed AND signes of imbalance Score: 1  11. WALK WITH HEAD TURNS HORIZONTAL: begin walking at normal speed, when I say right, turn your head to the right, when I say left turn your head to the left.  Try to keep yourself walking in a straight line   (2) performs head turns with no change in gait speed and good balance   (1) performs head turns with reduction in gait speed   (0)  performs head turns with imbalance Score: 1  12. WALK WITH PIVOT TURNS: begin walking at your normal speed.  When I tell you to "turn and stop", turn as quickly as you can, face the opposite direction and stop.  After the turn, your feet should be close together   (2) turns with feet close FAST in 3 steps with good balance   (1) turns with feet close SLOW > 4 steps with good balance   (0) cannot turn with feet close  at any speed without imbalance Score: 1  13. STEP OVER OBSTACLES:  ( 9 inch height 10 feet away from subject) begin walking at your normal speed.  When you get to the box, step over it, not around it and  keep walking   (2) Able to step over box with minimal change of gait speed and good balance   (1) Steps over box but touches box OR displays cautious behavior by slowing gait   (0) Unable to step over box OR steps around box Score: 1                                                                                                                                 TREATMENT DATE:  06/19/2023: Nustep level 5 x4 min with PT present to discuss status 6 minute walk test:  1182 ft with RPE of 1/10 (age related norms ar 1,999 ft) Seated hamstring stretch 2x20 sec bilat Tandem gait on AirEx beam in parallel bars with UE support, as needed.  Down and back x3 Side stepping on AirEx beam in parallel bars with UE support, as needed.  Down and back x3 Static standing on flat side of bosu 2x1 min with UE support, as needed  Marching on trampoline x2 min Leg Press (seat at 8) 100# 2x10   06/14/2023: Nustep level 5 x6 min with PT present to discuss status Standing hamstring stretch with 3rd step 2x20 sec bilat Tandem gait on AirEx beam in parallel bars with UE support, as needed.  Down and back x3 Side stepping on AirEx beam in parallel bars with UE support, as needed.  Down and back x3 FWD step ups onto bosu in parallel bars with UE support, as needed.  X10 bilat Side step up and over bosu parallel bars with UE support, as needed.  X10 bilat Static standing on flat side of bosu 2x1 min with UE support, as needed  Marching on trampoline x2 min Standing rocker board for DF/PF x1 min Leg Press (seat at 8) 100# 2x10   06/12/2023: Nustep level 5 x6 min with PT present to discuss status Standing hamstring stretch with 3rd step 2x20 sec bilat Standing rocker board for DF/PF x2 min Standing on wobble board x2 min with slight finger hold to improved balance. Tandem gait on AirEx beam in parallel bars with UE support, as needed.  Down and back x3 Side stepping on AirEx beam in parallel bars with UE support, as  needed.  Down and back x3 FWD step ups onto bosu in parallel bars with UE support, as needed.  X10 bilat Side step up and over bosu parallel bars with UE support, as needed.  X10 bilat Marching on trampoline x1 min Marching on trampoline with head rotations x1 min Leg Press (seat at 8) 100# 2x10    PATIENT EDUCATION:  Education details: POC; HEP compliance; HEP Person educated: Patient Education method: Programmer, multimedia, Facilities manager, and Handouts Education  comprehension: verbalized understanding, returned demonstration, and needs further education  HOME EXERCISE PROGRAM: Access Code: [email or web address] URL: [email or web address] Date: 06/05/2023 Prepared by: Chaneta Comer Kaydan Wong  Exercises - Sit to Stand with Armchair  - 1 x daily - 7 x weekly - 1 sets - 10 reps - Standing Hip Extension with Counter Support  - 1 x daily - 7 x weekly - 2 sets - 10 reps - Standing Hip Abduction with Counter Support  - 1 x daily - 7 x weekly - 2 sets - 10 reps - Heel Raises with Counter Support  - 1 x daily - 7 x weekly - 2 sets - 10 reps - Standing Single Leg Stance with Counter Support  - 1 x daily - 7 x weekly - 2 reps - 30 sec hold - Tandem Stance with Support  - 1 x daily - 7 x weekly - 2 reps - 30 sec hold - Step Sideways with Arms Reaching  - 1 x daily - 7 x weekly - 2 sets - 10 reps - Standing Reach to Side with Weight Shift  - 1 x daily - 7 x weekly - 2 sets - 10 reps - Romberg Stance on Foam Pad with Head Rotation  - 1 x daily - 7 x weekly - 2 sets - 10 reps - Narrow Stance with Eyes Closed and Head Nods on Foam Pad  - 1 x daily - 7 x weekly - 2 sets - 10 reps  ASSESSMENT:  CLINICAL IMPRESSION: Mr Coltrane presents to skilled PT reporting that he does feel that he's having more energy.  Patient able to complete a 6 minute walk test today with minimal dyspnea of 1/10 at end of 6 minute walk today.  Patient with continued progress towards improved balance, but continues to require UE support with more  advanced standing balance tasks.  Patient is on track to meet all goals by end of certification.  OBJECTIVE IMPAIRMENTS: Abnormal gait, decreased activity tolerance, decreased balance, decreased endurance, difficulty walking, decreased strength, and postural dysfunction.     GOALS: Goals reviewed with patient? Yes  SHORT TERM GOALS: Target date: 06/05/2023  Patient will be independent with initial HEP. Baseline:  Goal status: Met on 4/1  2.  Patient will report > or = to 30% improvement in overall balance confidence since starting PT. Baseline:  Goal status: Met on 06/05/23 (reports 50% improvement)  3.  Patient will be able to complete sit to stand without UE support for improved functional strength. Baseline:  Goal status: Met on 06/05/23    LONG TERM GOALS: Target date: 07/03/2023  Patient will demonstrate independence in advanced HEP. Baseline:  Goal status: Ongoing  2.  Patient will report > or = to 60% improvement in balance confidence since starting PT. Baseline:  Goal status: Ongoing (reported 50% improvement on 06/05/23)   3.  Patient will score < or = to 12 sec on 5STS for improved functional strength. Baseline: 15.12 sec Goal status: Ongoing (see above)  4.  Patient will ambulate > or = to 616ft on for improved community negotiation.  Baseline:  Goal status: Goal Met on 06/05/23  5.  Patient will be able to ascend / descend a curb for improved community negotiation. Baseline:  Goal status: Ongoing     PLAN:  PT FREQUENCY: 2x/week  PT DURATION: 8 weeks  PLANNED INTERVENTIONS: 97164- PT Re-evaluation, 97110-Therapeutic exercises, 97530- Therapeutic activity, [id number]- Neuromuscular re-education, 97535- Self Care, 62130- Manual therapy, [id number]- Gait training, [phone]- Canalith  repositioning, [address]- Aquatic Therapy, [phone]- Vasopneumatic device, [id number]- Traction (mechanical), [id number]- Ionotophoresis 4mg /ml Dexamethasone, Balance training, Stair training, Taping, Dry  Needling, Joint mobilization, Joint manipulation, Spinal manipulation, Spinal mobilization, Vestibular training, Cryotherapy, and Moist heat  PLAN FOR NEXT SESSION:  leg strengthening (leg press/ step ups/staggered STS); dynamic balance; balance on foam (eyes closed), stepping strategies    Robyne Christen, PT, DPT 06/19/23, 12:37 PM  [facility] [address], [address] [place], [place] [address] Phone # [phone] Fax [phone]

## 2023-06-20 ENCOUNTER — Encounter: Payer: Self-pay | Admitting: Emergency Medicine

## 2023-06-21 ENCOUNTER — Ambulatory Visit (HOSPITAL_COMMUNITY)
Admit: 2023-06-21 | Discharge: 2023-06-21 | Disposition: A | Discharge status: Home / Self Care | Attending: Physician Assistant | Admitting: Physician Assistant

## 2023-06-21 ENCOUNTER — Encounter (HOSPITAL_COMMUNITY): Payer: Self-pay | Admitting: Physician Assistant

## 2023-06-21 VITALS — BP 132/90 | HR 59 | Ht 73.0 in | Wt 159.0 lb

## 2023-06-21 DIAGNOSIS — Z7901 Long term (current) use of anticoagulants: Secondary | ICD-10-CM | POA: Insufficient documentation

## 2023-06-21 DIAGNOSIS — I2510 Atherosclerotic heart disease of native coronary artery without angina pectoris: Secondary | ICD-10-CM | POA: Insufficient documentation

## 2023-06-21 DIAGNOSIS — D6869 Other thrombophilia: Secondary | ICD-10-CM | POA: Insufficient documentation

## 2023-06-21 DIAGNOSIS — Z79899 Other long term (current) drug therapy: Secondary | ICD-10-CM | POA: Diagnosis not present

## 2023-06-21 DIAGNOSIS — E7800 Pure hypercholesterolemia, unspecified: Secondary | ICD-10-CM | POA: Insufficient documentation

## 2023-06-21 DIAGNOSIS — I10 Essential (primary) hypertension: Secondary | ICD-10-CM | POA: Diagnosis not present

## 2023-06-21 DIAGNOSIS — I480 Paroxysmal atrial fibrillation: Secondary | ICD-10-CM | POA: Diagnosis not present

## 2023-06-21 DIAGNOSIS — R001 Bradycardia, unspecified: Secondary | ICD-10-CM | POA: Insufficient documentation

## 2023-06-21 NOTE — Progress Notes (Signed)
 Primary Care Physician: Farris Has, MD Primary Cardiologist: [name], MD Electrophysiologist: Maurice Small, MD  Referring Physician: Dr Cresenciano Lick is a 73 y.o. male with a history of MR s/p repair, HTN, HLD, CAD, atrial fibrillation who presents for follow up in the [facility].  The patient underwent mitral valve surgery with maze and left atrial appendage clipping on December 08, 2022. He was treated with amiodarone for postoperative atrial fibrillation. He wore a cardiac monitor 01/2023 which showed a 12% afib burden. He was seen by Dr Nelly Laurence and underwent afib ablation on 05/24/23. Patient is on Eliquis for stroke prevention.   Patient presents today for follow up for atrial fibrillation. He reports that he has done well since the ablation. He remains in SR. He denies chest pain or groin issues.   Today, he denies symptoms of palpitations, chest pain, shortness of breath, orthopnea, PND, lower extremity edema, dizziness, presyncope, syncope, snoring, daytime somnolence, bleeding, or neurologic sequela. The patient is tolerating medications without difficulties and is otherwise without complaint today.    Atrial Fibrillation Risk Factors:  he does not have symptoms or diagnosis of sleep apnea. he does not have a history of rheumatic fever.   Atrial Fibrillation Management history:  Previous antiarrhythmic drugs: amiodarone  Previous cardioversions: none Previous ablations: 05/24/23 Anticoagulation history: Eliquis  ROS- All systems are reviewed and negative except as per the HPI above.  Past Medical History:  Diagnosis Date   Cancer (HCC)    Skin Cancer - Scalp, Right neck, Left Hand   Dysrhythmia    A. Fib while hospitalized June 2024   Heart murmur    History of nonmelanoma skin cancer    Hypercholesteremia    Hypertension    Pneumonia 08/2022   Trigeminal neuralgia     Current Outpatient Medications   Medication Sig Dispense Refill   acetaminophen (TYLENOL) 325 MG tablet Take 2 tablets (650 mg total) by mouth every 6 (six) hours. (Patient taking differently: Take 650 mg by mouth as needed.)     apixaban (ELIQUIS) 5 MG TABS tablet Take 1 tablet (5 mg total) by mouth 2 (two) times daily. 60 tablet    Ascorbic Acid (VITAMIN C PO) Take 750 mg by mouth daily.     Cholecalciferol (VITAMIN D) 50 MCG (2000 UT) tablet Take 2,000 Units by mouth daily.     folic acid (FOLVITE) 1 MG tablet Take 1 mg by mouth daily.     gabapentin (NEURONTIN) 300 MG capsule TAKE 2 CAPSULES BY MOUTH 4 TIMES DAILY (Patient taking differently: Take 300 mg by mouth 2 (two) times daily.) 480 capsule 5   hydrocortisone 2.5 % ointment Apply 1 Application topically 2 (two) times daily as needed (itching).     ketoconazole (NIZORAL) 2 % cream Apply 1 Application topically as needed for irritation.     Magnesium 400 MG TABS Take 400 mg by mouth daily.     melatonin 3 MG TABS tablet Take 3 mg by mouth at bedtime as needed (sleep).     metoprolol tartrate (LOPRESSOR) 50 MG tablet Take 1 tablet (50 mg total) by mouth 2 (two) times daily. 180 tablet 3   Multiple Vitamins-Minerals (PRESERVISION AREDS 2) CAPS Take 1 capsule by mouth 2 (two) times daily.     olmesartan (BENICAR) 40 MG tablet Take 40 mg by mouth daily.     Polyethyl Glycol-Propyl Glycol (SYSTANE OP) Place 1 drop into both eyes daily  as needed (dry eyes).     potassium chloride (KLOR-CON) 10 MEQ tablet Take 1 tablet (10 mEq total) by mouth daily. 30 tablet 0   pravastatin (PRAVACHOL) 20 MG tablet Take 20 mg by mouth daily.     sildenafil (REVATIO) 20 MG tablet Take 40-60 mg by mouth daily as needed (ED).     thiamine (VITAMIN B-1) 100 MG tablet Take 100 mg by mouth daily.     traZODone (DESYREL) 100 MG tablet Take 100-150 mg by mouth at bedtime.     No current facility-administered medications for this encounter.    Physical Exam: BP (!) 132/90   Pulse (!) 59   Ht  6\' 1"  (1.854 m)   Wt 72.1 kg   BMI 20.98 kg/m   GEN: Well nourished, well developed in no acute distress CARDIAC: Regular rate and rhythm, no murmurs, rubs, gallops RESPIRATORY:  Clear to auscultation without rales, wheezing or rhonchi  ABDOMEN: Soft, non-tender, non-distended EXTREMITIES:  No edema; No deformity   Wt Readings from Last 3 Encounters:  06/21/23 72.1 kg  05/24/23 70.3 kg  03/30/23 72.3 kg     EKG today demonstrates  SB Vent rate 59 bpm PR 194 ms QRS 90 ms QT/QTc 424/419 ms   Echo 01/19/23 demonstrated   1. Left ventricular ejection fraction, by estimation, is 55 to 60%. The  left ventricle has normal function. The left ventricle has no regional  wall motion abnormalities. There is mild asymmetric left ventricular  hypertrophy of the basal-septal segment. Left ventricular diastolic parameters are consistent with Grade II diastolic dysfunction (pseudonormalization).   2. Right ventricular systolic function is mildly reduced. The right  ventricular size is mildly enlarged. There is normal pulmonary artery  systolic pressure. The estimated right ventricular systolic pressure is  33.0 mmHg.   3. Left atrial size was moderately dilated.   4. S/p mitral valve repair. Mean gradient 3 mmHg, no significant  stenosis. No significant mitral regurgitation.   5. The aortic valve is tricuspid. There is mild calcification of the  aortic valve. Aortic valve regurgitation is not visualized. No aortic  stenosis is present.   6. The inferior vena cava is dilated in size with >50% respiratory  variability, suggesting right atrial pressure of 8 mmHg.    CHA2DS2-VASc Score = 2  The patient's score is based upon: CHF History: 0 HTN History: 1 Diabetes History: 0 Stroke History: 0 Vascular Disease History: 0 Age Score: 1 Gender Score: 0       ASSESSMENT AND PLAN: Paroxysmal Atrial Fibrillation (ICD10:  I48.0) The patient's CHA2DS2-VASc score is 2, indicating a 2.2%  annual risk of stroke.   S/p MAZE 12/2022 S/p afib ablation 05/24/23, now off amiodarone  Patient appears to be maintaining SR Continue Eliquis 5 mg BID Continue Lopressor 50 mg BID  Secondary Hypercoagulable State (ICD10:  D68.69) The patient is at significant risk for stroke/thromboembolism based upon his CHA2DS2-VASc Score of 2.  Continue Apixaban (Eliquis). S/p LAA clipping 12/2022. No bleeding issues.   HTN Stable on current regimen  CAD CAC score 205 on CT No anginal symptoms Followed by Dr Lynnette Caffey   VHD Severe MR s/p repair 12/08/22    Follow up with Otilio Saber as scheduled.        Jorja Loa PA-C [facility] [address] [place], [place] [address] [phone]

## 2023-06-26 ENCOUNTER — Ambulatory Visit: Admitting: Rehabilitative and Restorative Service Providers"

## 2023-06-26 ENCOUNTER — Encounter: Payer: Self-pay | Admitting: Rehabilitative and Restorative Service Providers"

## 2023-06-26 ENCOUNTER — Telehealth: Payer: Self-pay | Admitting: Rehabilitative and Restorative Service Providers"

## 2023-06-26 DIAGNOSIS — R293 Abnormal posture: Secondary | ICD-10-CM

## 2023-06-26 DIAGNOSIS — R2681 Unsteadiness on feet: Secondary | ICD-10-CM | POA: Diagnosis not present

## 2023-06-26 DIAGNOSIS — M6281 Muscle weakness (generalized): Secondary | ICD-10-CM | POA: Diagnosis not present

## 2023-06-26 DIAGNOSIS — R2689 Other abnormalities of gait and mobility: Secondary | ICD-10-CM | POA: Diagnosis not present

## 2023-06-26 NOTE — Therapy (Signed)
 OUTPATIENT PHYSICAL THERAPY TREATMENT   Patient Name: James Ibarra MRN: [id number] DOB:04/21/1950, 73 y.o., male Today's Date: 06/26/2023   END OF SESSION:  PT End of Session - 06/26/23 1243     Visit Number 13    Date for PT Re-Evaluation 07/03/23    Authorization Type [employer] MCR    Authorization Time Period 06/05/2023 - 07/31/2023    Authorization - Visit Number 5    Authorization - Number of Visits 8    Progress Note Due on Visit 18    PT Start Time 1240    PT Stop Time 1318    PT Time Calculation (min) 38 min    Activity Tolerance Patient tolerated treatment well    Behavior During Therapy WFL for tasks assessed/performed              Past Medical History:  Diagnosis Date   Cancer (HCC)    Skin Cancer - Scalp, Right neck, Left Hand   Dysrhythmia    A. Fib while hospitalized June 2024   Heart murmur    History of nonmelanoma skin cancer    Hypercholesteremia    Hypertension    Pneumonia 08/2022   Trigeminal neuralgia    Past Surgical History:  Procedure Laterality Date   ATRIAL FIBRILLATION ABLATION N/A 05/24/2023   Procedure: ATRIAL FIBRILLATION ABLATION;  Surgeon: Efraim Grange, MD;  Location: [facility];  Service: Cardiovascular;  Laterality: N/A;   CATARACT EXTRACTION W/ INTRAOCULAR LENS IMPLANT Bilateral    CLIPPING OF ATRIAL APPENDAGE Left 12/08/2022   Procedure: CLIPPING OF ATRIAL APPENDAGE (LAA) USING 50 MM ATRICLIP;  Surgeon: Melene Sportsman, MD;  Location: [facility] OR;  Service: Open Heart Surgery;  Laterality: Left;   INGUINAL HERNIA REPAIR  11/07/2011   Procedure: LAPAROSCOPIC INGUINAL HERNIA;  Surgeon: Fran Imus, MD,FACS;  Location: [facility];  Service: General;  Laterality: Left;   MAZE N/A 12/08/2022   Procedure: MAZE;  Surgeon: Melene Sportsman, MD;  Location: [facility] OR;  Service: Open Heart Surgery;  Laterality: N/A;   MITRAL VALVE REPAIR N/A 12/08/2022   Procedure: MITRAL VALVE REPAIR (MVR) USING 34 MM SIMULUS SEMI- RIGID ANNULOPLASTY BAND;   Surgeon: Melene Sportsman, MD;  Location: [facility] OR;  Service: Open Heart Surgery;  Laterality: N/A;   RETINAL DETACHMENT SURGERY  03/07/1995   RIGHT/LEFT HEART CATH AND CORONARY ANGIOGRAPHY N/A 10/12/2022   Procedure: RIGHT/LEFT HEART CATH AND CORONARY ANGIOGRAPHY;  Surgeon: Kyra Phy, MD;  Location: [facility];  Service: Cardiovascular;  Laterality: N/A;   SKIN CANCER EXCISION  2012, 1999   basal cell - neck and hand   TEE WITHOUT CARDIOVERSION N/A 10/16/2022   Procedure: TRANSESOPHAGEAL ECHOCARDIOGRAM;  Surgeon: Jann Melody, MD;  Location: [facility];  Service: Cardiovascular;  Laterality: N/A;   TEE WITHOUT CARDIOVERSION N/A 12/08/2022   Procedure: TRANSESOPHAGEAL ECHOCARDIOGRAM;  Surgeon: Melene Sportsman, MD;  Location[facility];  Service: Open Heart Surgery;  Laterality: N/A;   Patient Active Problem List   Diagnosis Date Noted   Hypercoagulable state due to paroxysmal atrial fibrillation (HCC) 06/21/2023   S/P MVR (mitral valve repair) 12/08/2022   Protein-calorie malnutrition, severe 08/19/2022   Alcohol withdrawal (HCC) 08/14/2022   Acute metabolic encephalopathy 08/14/2022   Paroxysmal atrial fibrillation with RVR (HCC) 08/14/2022   Nonrheumatic mitral valve regurgitation 08/14/2022   Mitral valve prolapse 08/14/2022   PNA (pneumonia) 08/12/2022   Severe sepsis (HCC) 08/12/2022   HLD (hyperlipidemia) 08/12/2022   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  Trigeminal neuralgia of left side of face 07/15/2019   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  Patient reports that he did not sleep well last night due to insomnia.  PERTINENT HISTORY: Skin cancer; HTN ; open heart surgery 2023  PAIN:  Are you having pain? No  PRECAUTIONS: None  WEIGHT BEARING RESTRICTIONS: No  FALLS:  Has patient fallen in last 6 months? No  LIVING ENVIRONMENT: Lives with: lives with their spouse Lives in: House/apartment Stairs: Yes: Internal: down to basement but rarely uses them steps; bilateral but cannot reach both and External: 8 steps; can reach both Has following equipment at home: Otho Blitz - 2 wheeled  OCCUPATION: Retired; Environmental health practitioner in pharmacology   PLOF: Independent, Independent with basic ADLs, Independent with household mobility without device, Independent with community mobility without device, Independent with gait, and Independent with transfers  PATIENT GOALS: Strengthening his legs and working on balance  NEXT MD VISIT: PRN  OBJECTIVE:  Note: Objective measures were completed at Evaluation unless otherwise noted.  PATIENT SURVEYS:  Eval:  ABC scale 71% moderate level of physical functioning 06/05/2023:  Total ABC score: 1540 / 1600 = 96.3 %  POSTURE: rounded shoulders and forward head   LOWER EXTREMITY MMT: Grossly 4/5    FUNCTIONAL TESTS:  Eval: 5 times sit to stand: 15.12 sec with UE support Timed up and go (TUG): 9.49sec 3 minute walk test: 561 RPE:0-1 SL Balance Rt 12 sec  Lt 8 sec with finger touch on counter; < 2 sec with no UE support  06/05/2023: 5 times sit to stand: 13.25 sec with only occasional pushing through thighs with forearms 3 minute walk test: 648 ft with RPE of 1-2/10 Single Leg Stance:  right- 2.53 sec, left- 4.16 sec  06/19/2023: 6 minute walk test:  1182 ft with RPE of 1/10 (age related norms ar 1,999 ft)  GAIT: Distance  walked: 561FT Assistive device utilized: None Level of assistance: Complete Independence Comments: Wide BOS; decreased cadence  DYNAMIC GAIT INDEX: 3/6: 18/24  MINI-BESTest: Balance Evaluation Systems Test ANTICIPATORY: SIT TO STAND: (2) normal without use of hands and stabilizes independently     (1) Moderate comes to stand WITH use of hands on 1st attempt    (0) Severe: unable to stand up from chair without assistance OR needs several attempts with use of   hands Score: 2  2. RISE TO TOES: feet shoulder width apart. Hands on hips.  Rise as high as you can onto your toes. Try to hold this pose for 3 sec                          (2) stable for 3s with maximum height    (1) heels up, but not full range (smaller than when holding hands) OR noticeable instability for 3 sec                           (  0) < or equat to 3 s  Score: 1  REACTIVE POSTURAL CONTROL 4. COMPENSATORY STEPPING CORRECTION FORWARD stand with feet shoulder width apart, arms at your sides. Lean forward against my hands beyond your forward limits.  When I let go, do whatever is necessary, including taking a step, to avoid a fall   (2) recovers independently  with a single, large step, to avoid a fall   (1) more than one step used to recover equilibrium   (0) No step OR would fall if not caught Score: 1  5. COMPENSATORY STEPPING CORRECTION BACKWARD: Lean backward against my hands beyond your backward limits.  When I let go, do whatever is necessary, including a step to avoid a fall. (2) recovers independently  with a single, large step, to avoid a fall   (1) more than one step used to recover equilibrium   (0) No step OR would fall if not caught Score: 1  6. COMPENSATORY STEPPING LATERAL: Lean into my hand beyond your sideways limit.  When I let go, do whatever is necessary, including taking a step, to avoid a fall. Left           Right   (2) recovers independently with 1 step (crossover or lateral OK)   (1) several  steps to recover equilibrium   (0) falls or cannot step Score: 1   SENSORY ORIENTATION 7. STANCE FEET TOGETHER EYES OPEN  firm surface Be as stable and still as possible until I say stop   (2) 30s   (1) < 30 s   (0) unable Score: 2  8. STANCE ON FOAM EYES CLOSED feet together, eyes closed, hands on hips. Be as stable and still as you can until I stay stop.  I will start timing when you close your eyes. Time in seconds:      (2) 30s   (1) <30 s   (0) unable Score: 1  9. INCLINE EYES CLOSED: Stand on incline with feet shoulder width apart, arms down at your sides.  I will start timing when you close your eyes   (2) Stands 30 s and aligns with gravity   (1) stands < 30 s OR aligns with surface   (0) unable Score: 1  DYNAMIC GAIT 10. CHANGE IN GAIT SPEED: begin walking at your normal speed, when I tell you "fast", walk as fast as you can, when I say "slow" walk very slowly   (2) significantly changes walking speed without imbalance   (1) unable to change walking speed or signs of imbalance   (0) unable to achieve significant change in walking speed AND signes of imbalance Score: 1  11. WALK WITH HEAD TURNS HORIZONTAL: begin walking at normal speed, when I say right, turn your head to the right, when I say left turn your head to the left.  Try to keep yourself walking in a straight line   (2) performs head turns with no change in gait speed and good balance   (1) performs head turns with reduction in gait speed   (0)  performs head turns with imbalance Score: 1  12. WALK WITH PIVOT TURNS: begin walking at your normal speed.  When I tell you to "turn and stop", turn as quickly as you can, face the opposite direction and stop.  After the turn, your feet should be close together   (2) turns with feet close FAST in 3 steps with good balance   (1) turns with feet close SLOW >  4 steps with good balance   (0) cannot turn with feet close at any speed without imbalance Score: 1  13. STEP  OVER OBSTACLES:  ( 9 inch height 10 feet away from subject) begin walking at your normal speed.  When you get to the box, step over it, not around it and keep walking   (2) Able to step over box with minimal change of gait speed and good balance   (1) Steps over box but touches box OR displays cautious behavior by slowing gait   (0) Unable to step over box OR steps around box Score: 1                                                                                                                                 TREATMENT DATE:  06/26/2023: Nustep level 5 x6 min with PT present to discuss status Single leg stance in parallel bars with UE support, as needed, 2x30 sec bilat Tandem gait on AirEx beam in parallel bars with UE support, as needed.  Down and back x3 Side stepping on AirEx beam in parallel bars with UE support, as needed.  Down and back x3 Static standing on flat side of bosu 2x1 min with UE support, as needed  FWD step ups onto bosu in parallel bars with UE support, as needed.  X10 bilat Side step up and over bosu parallel bars with UE support, as needed.  X10 bilat Leg Press (seat at 8) 100# 2x10 Standing on foam pad at barre:  hip abduction, hip extension.  2x10 each bilat Standing hamstring stretch on 3rd step:  2x20 sec bilat   06/19/2023: Nustep level 5 x4 min with PT present to discuss status 6 minute walk test:  1182 ft with RPE of 1/10 (age related norms ar 1,999 ft) Seated hamstring stretch 2x20 sec bilat Tandem gait on AirEx beam in parallel bars with UE support, as needed.  Down and back x3 Side stepping on AirEx beam in parallel bars with UE support, as needed.  Down and back x3 Static standing on flat side of bosu 2x1 min with UE support, as needed  Marching on trampoline x2 min Leg Press (seat at 8) 100# 2x10   06/14/2023: Nustep level 5 x6 min with PT present to discuss status Standing hamstring stretch with 3rd step 2x20 sec bilat Tandem gait on AirEx beam in  parallel bars with UE support, as needed.  Down and back x3 Side stepping on AirEx beam in parallel bars with UE support, as needed.  Down and back x3 FWD step ups onto bosu in parallel bars with UE support, as needed.  X10 bilat Side step up and over bosu parallel bars with UE support, as needed.  X10 bilat Static standing on flat side of bosu 2x1 min with UE support, as needed  Marching on trampoline x2 min Standing rocker board for DF/PF x1 min Leg Press (seat at  8) 100# 2x10   PATIENT EDUCATION:  Education details: POC; HEP compliance; HEP Person educated: Patient Education method: Explanation, Demonstration, and Handouts Education comprehension: verbalized understanding, returned demonstration, and needs further education  HOME EXERCISE PROGRAM: Access Code: [email or web address] URL: [email or web address] Date: 06/05/2023 Prepared by: Chaneta Comer Zunairah Devers  Exercises - Sit to Stand with Armchair  - 1 x daily - 7 x weekly - 1 sets - 10 reps - Standing Hip Extension with Counter Support  - 1 x daily - 7 x weekly - 2 sets - 10 reps - Standing Hip Abduction with Counter Support  - 1 x daily - 7 x weekly - 2 sets - 10 reps - Heel Raises with Counter Support  - 1 x daily - 7 x weekly - 2 sets - 10 reps - Standing Single Leg Stance with Counter Support  - 1 x daily - 7 x weekly - 2 reps - 30 sec hold - Tandem Stance with Support  - 1 x daily - 7 x weekly - 2 reps - 30 sec hold - Step Sideways with Arms Reaching  - 1 x daily - 7 x weekly - 2 sets - 10 reps - Standing Reach to Side with Weight Shift  - 1 x daily - 7 x weekly - 2 sets - 10 reps - Romberg Stance on Foam Pad with Head Rotation  - 1 x daily - 7 x weekly - 2 sets - 10 reps - Narrow Stance with Eyes Closed and Head Nods on Foam Pad  - 1 x daily - 7 x weekly - 2 sets - 10 reps  ASSESSMENT:  CLINICAL IMPRESSION: Mr Rinkenberger presents to skilled PT reporting that he did not sleep well last night and is not feeling his best today.   Patient able to progress through session with only occasional recovery periods due to fatigue.  Patient continues to progress towards improved balance.  Patient able to perform standing strengthening exercises on balance pad to assist with functional strength/balance.  Patient is on track for discharge next week, as anticipated.  OBJECTIVE IMPAIRMENTS: Abnormal gait, decreased activity tolerance, decreased balance, decreased endurance, difficulty walking, decreased strength, and postural dysfunction.     GOALS: Goals reviewed with patient? Yes  SHORT TERM GOALS: Target date: 06/05/2023  Patient will be independent with initial HEP. Baseline:  Goal status: Met on 4/1  2.  Patient will report > or = to 30% improvement in overall balance confidence since starting PT. Baseline:  Goal status: Met on 06/05/23 (reports 50% improvement)  3.  Patient will be able to complete sit to stand without UE support for improved functional strength. Baseline:  Goal status: Met on 06/05/23    LONG TERM GOALS: Target date: 07/03/2023  Patient will demonstrate independence in advanced HEP. Baseline:  Goal status: Ongoing  2.  Patient will report > or = to 60% improvement in balance confidence since starting PT. Baseline:  Goal status: Ongoing (reported 50% improvement on 06/05/23)   3.  Patient will score < or = to 12 sec on 5STS for improved functional strength. Baseline: 15.12 sec Goal status: Ongoing (see above)  4.  Patient will ambulate > or = to 68ft on for improved community negotiation.  Baseline:  Goal status: Goal Met on 06/05/23  5.  Patient will be able to ascend / descend a curb for improved community negotiation. Baseline:  Goal status: Ongoing     PLAN:  PT FREQUENCY: 2x/week  PT DURATION: 8 weeks  PLANNED INTERVENTIONS: 97164- PT Re-evaluation, 97110-Therapeutic exercises, 97530- Therapeutic activity, [id number]- Neuromuscular re-education, [phone]- Self Care, 60454- Manual  therapy, [phone]- Gait training, [phone]- Canalith repositioning, [phone]- Aquatic Therapy, [phone]- Vasopneumatic device, [phone]- Traction (mechanical), [phone]- Ionotophoresis 4mg /ml Dexamethasone , Balance training, Stair training, Taping, Dry Needling, Joint mobilization, Joint manipulation, Spinal manipulation, Spinal mobilization, Vestibular training, Cryotherapy, and Moist heat  PLAN FOR NEXT SESSION:  leg strengthening (leg press/ step ups/staggered STS); dynamic balance; balance on foam (eyes closed), stepping strategies    Robyne Christen, PT, DPT 06/26/23, 1:21 PM  [facility] [address] [place], [place] [address] Phone # [phone] Fax [phone]

## 2023-06-26 NOTE — Telephone Encounter (Addendum)
 Called patient secondary to missed visit, he apologized and stated that he lost track of what date today was and didn't realize it was the date of his appointment.  Patient confirmed next scheduled appointment and that he would be present.  This PT had a patient cancel at 12:30 same day and patient stated that he would come to this appointment, but he may be a little late due to late notice.  Appointment rescheduled.

## 2023-06-28 ENCOUNTER — Encounter: Payer: Self-pay | Admitting: Rehabilitative and Restorative Service Providers"

## 2023-06-28 ENCOUNTER — Ambulatory Visit: Admitting: Rehabilitative and Restorative Service Providers"

## 2023-06-28 DIAGNOSIS — M6281 Muscle weakness (generalized): Secondary | ICD-10-CM

## 2023-06-28 DIAGNOSIS — R2689 Other abnormalities of gait and mobility: Secondary | ICD-10-CM | POA: Diagnosis not present

## 2023-06-28 DIAGNOSIS — R293 Abnormal posture: Secondary | ICD-10-CM

## 2023-06-28 DIAGNOSIS — R2681 Unsteadiness on feet: Secondary | ICD-10-CM

## 2023-06-28 NOTE — Therapy (Signed)
 OUTPATIENT PHYSICAL THERAPY TREATMENT   Patient Name: James Ibarra MRN: [id number] DOB:01-Apr-1950, 73 y.o., male Today's Date: 06/28/2023   END OF SESSION:  PT End of Session - 06/28/23 1152     Visit Number 14    Date for PT Re-Evaluation 07/03/23    Authorization Type [employer] MCR    Authorization Time Period 06/05/2023 - 07/31/2023    Authorization - Visit Number 6    Authorization - Number of Visits 8    Progress Note Due on Visit 18    PT Start Time 1144    PT Stop Time 1225    PT Time Calculation (min) 41 min    Activity Tolerance Patient tolerated treatment well    Behavior During Therapy WFL for tasks assessed/performed              Past Medical History:  Diagnosis Date   Cancer (HCC)    Skin Cancer - Scalp, Right neck, Left Hand   Dysrhythmia    A. Fib while hospitalized June 2024   Heart murmur    History of nonmelanoma skin cancer    Hypercholesteremia    Hypertension    Pneumonia 08/2022   Trigeminal neuralgia    Past Surgical History:  Procedure Laterality Date   ATRIAL FIBRILLATION ABLATION N/A 05/24/2023   Procedure: ATRIAL FIBRILLATION ABLATION;  Surgeon: Efraim Grange, MD;  Location: [facility];  Service: Cardiovascular;  Laterality: N/A;   CATARACT EXTRACTION W/ INTRAOCULAR LENS IMPLANT Bilateral    CLIPPING OF ATRIAL APPENDAGE Left 12/08/2022   Procedure: CLIPPING OF ATRIAL APPENDAGE (LAA) USING 50 MM ATRICLIP;  Surgeon: Melene Sportsman, MD;  Location: [facility] OR;  Service: Open Heart Surgery;  Laterality: Left;   INGUINAL HERNIA REPAIR  11/07/2011   Procedure: LAPAROSCOPIC INGUINAL HERNIA;  Surgeon: Fran Imus, MD,FACS;  Location: [facility];  Service: General;  Laterality: Left;   MAZE N/A 12/08/2022   Procedure: MAZE;  Surgeon: Melene Sportsman, MD;  Location: [facility] OR;  Service: Open Heart Surgery;  Laterality: N/A;   MITRAL VALVE REPAIR N/A 12/08/2022   Procedure: MITRAL VALVE REPAIR (MVR) USING 34 MM SIMULUS SEMI- RIGID ANNULOPLASTY BAND;   Surgeon: Melene Sportsman, MD;  Location: [facility] OR;  Service: Open Heart Surgery;  Laterality: N/A;   RETINAL DETACHMENT SURGERY  03/07/1995   RIGHT/LEFT HEART CATH AND CORONARY ANGIOGRAPHY N/A 10/12/2022   Procedure: RIGHT/LEFT HEART CATH AND CORONARY ANGIOGRAPHY;  Surgeon: Kyra Phy, MD;  Location: [facility];  Service: Cardiovascular;  Laterality: N/A;   SKIN CANCER EXCISION  2012, 1999   basal cell - neck and hand   TEE WITHOUT CARDIOVERSION N/A 10/16/2022   Procedure: TRANSESOPHAGEAL ECHOCARDIOGRAM;  Surgeon: Jann Melody, MD;  Location: [facility];  Service: Cardiovascular;  Laterality: N/A;   TEE WITHOUT CARDIOVERSION N/A 12/08/2022   Procedure: TRANSESOPHAGEAL ECHOCARDIOGRAM;  Surgeon: Melene Sportsman, MD;  Location: [facility];  Service: Open Heart Surgery;  Laterality: N/A;   Patient Active Problem List   Diagnosis Date Noted   Hypercoagulable state due to paroxysmal atrial fibrillation (HCC) 06/21/2023   S/P MVR (mitral valve repair) 12/08/2022   Protein-calorie malnutrition, severe 08/19/2022   Alcohol withdrawal (HCC) 08/14/2022   Acute metabolic encephalopathy 08/14/2022   Paroxysmal atrial fibrillation with RVR (HCC) 08/14/2022   Nonrheumatic mitral valve regurgitation 08/14/2022   Mitral valve prolapse 08/14/2022   PNA (pneumonia) 08/12/2022   Severe sepsis (HCC) 08/12/2022   HLD (hyperlipidemia) 08/12/2022   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  Trigeminal neuralgia of left side of face 07/15/2019   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  Patient reports that he did not sleep well last night due to insomnia.  PERTINENT HISTORY: Skin cancer; HTN ; open heart surgery 2023  PAIN:  Are you having pain? No  PRECAUTIONS: None  WEIGHT BEARING RESTRICTIONS: No  FALLS:  Has patient fallen in last 6 months? No  LIVING ENVIRONMENT: Lives with: lives with their spouse Lives in: House/apartment Stairs: Yes: Internal: down to basement but rarely uses them steps; bilateral but cannot reach both and External: 8 steps; can reach both Has following equipment at home: Otho Blitz - 2 wheeled  OCCUPATION: Retired; Environmental health practitioner in pharmacology   PLOF: Independent, Independent with basic ADLs, Independent with household mobility without device, Independent with community mobility without device, Independent with gait, and Independent with transfers  PATIENT GOALS: Strengthening his legs and working on balance  NEXT MD VISIT: PRN  OBJECTIVE:  Note: Objective measures were completed at Evaluation unless otherwise noted.  PATIENT SURVEYS:  Eval:  ABC scale 71% moderate level of physical functioning 06/05/2023:  Total ABC score: 1540 / 1600 = 96.3 %  POSTURE: rounded shoulders and forward head   LOWER EXTREMITY MMT: Grossly 4/5    FUNCTIONAL TESTS:  Eval: 5 times sit to stand: 15.12 sec with UE support Timed up and go (TUG): 9.49sec 3 minute walk test: 561 RPE:0-1 SL Balance Rt 12 sec  Lt 8 sec with finger touch on counter; < 2 sec with no UE support  06/05/2023: 5 times sit to stand: 13.25 sec with only occasional pushing through thighs with forearms 3 minute walk test: 648 ft with RPE of 1-2/10 Single Leg Stance:  right- 2.53 sec, left- 4.16 sec  06/19/2023: 6 minute walk test:  1182 ft with RPE of 1/10 (age related norms ar 1,999 ft)  GAIT: Distance  walked: 561FT Assistive device utilized: None Level of assistance: Complete Independence Comments: Wide BOS; decreased cadence  DYNAMIC GAIT INDEX: 3/6: 18/24  MINI-BESTest: Balance Evaluation Systems Test ANTICIPATORY: SIT TO STAND: (2) normal without use of hands and stabilizes independently     (1) Moderate comes to stand WITH use of hands on 1st attempt    (0) Severe: unable to stand up from chair without assistance OR needs several attempts with use of   hands Score: 2  2. RISE TO TOES: feet shoulder width apart. Hands on hips.  Rise as high as you can onto your toes. Try to hold this pose for 3 sec                          (2) stable for 3s with maximum height    (1) heels up, but not full range (smaller than when holding hands) OR noticeable instability for 3 sec                           (  0) < or equat to 3 s  Score: 1  REACTIVE POSTURAL CONTROL 4. COMPENSATORY STEPPING CORRECTION FORWARD stand with feet shoulder width apart, arms at your sides. Lean forward against my hands beyond your forward limits.  When I let go, do whatever is necessary, including taking a step, to avoid a fall   (2) recovers independently  with a single, large step, to avoid a fall   (1) more than one step used to recover equilibrium   (0) No step OR would fall if not caught Score: 1  5. COMPENSATORY STEPPING CORRECTION BACKWARD: Lean backward against my hands beyond your backward limits.  When I let go, do whatever is necessary, including a step to avoid a fall. (2) recovers independently  with a single, large step, to avoid a fall   (1) more than one step used to recover equilibrium   (0) No step OR would fall if not caught Score: 1  6. COMPENSATORY STEPPING LATERAL: Lean into my hand beyond your sideways limit.  When I let go, do whatever is necessary, including taking a step, to avoid a fall. Left           Right   (2) recovers independently with 1 step (crossover or lateral OK)   (1) several  steps to recover equilibrium   (0) falls or cannot step Score: 1   SENSORY ORIENTATION 7. STANCE FEET TOGETHER EYES OPEN  firm surface Be as stable and still as possible until I say stop   (2) 30s   (1) < 30 s   (0) unable Score: 2  8. STANCE ON FOAM EYES CLOSED feet together, eyes closed, hands on hips. Be as stable and still as you can until I stay stop.  I will start timing when you close your eyes. Time in seconds:      (2) 30s   (1) <30 s   (0) unable Score: 1  9. INCLINE EYES CLOSED: Stand on incline with feet shoulder width apart, arms down at your sides.  I will start timing when you close your eyes   (2) Stands 30 s and aligns with gravity   (1) stands < 30 s OR aligns with surface   (0) unable Score: 1  DYNAMIC GAIT 10. CHANGE IN GAIT SPEED: begin walking at your normal speed, when I tell you "fast", walk as fast as you can, when I say "slow" walk very slowly   (2) significantly changes walking speed without imbalance   (1) unable to change walking speed or signs of imbalance   (0) unable to achieve significant change in walking speed AND signes of imbalance Score: 1  11. WALK WITH HEAD TURNS HORIZONTAL: begin walking at normal speed, when I say right, turn your head to the right, when I say left turn your head to the left.  Try to keep yourself walking in a straight line   (2) performs head turns with no change in gait speed and good balance   (1) performs head turns with reduction in gait speed   (0)  performs head turns with imbalance Score: 1  12. WALK WITH PIVOT TURNS: begin walking at your normal speed.  When I tell you to "turn and stop", turn as quickly as you can, face the opposite direction and stop.  After the turn, your feet should be close together   (2) turns with feet close FAST in 3 steps with good balance   (1) turns with feet close SLOW >  4 steps with good balance   (0) cannot turn with feet close at any speed without imbalance Score: 1  13. STEP  OVER OBSTACLES:  ( 9 inch height 10 feet away from subject) begin walking at your normal speed.  When you get to the box, step over it, not around it and keep walking   (2) Able to step over box with minimal change of gait speed and good balance   (1) Steps over box but touches box OR displays cautious behavior by slowing gait   (0) Unable to step over box OR steps around box Score: 1                                                                                                                                 TREATMENT DATE:  06/28/2023: Nustep level 5 x6 min with PT present to discuss status Standing hamstring stretch on 3rd step:  2x20 sec bilat 4 way resisted gait with 10# cable pulley x3 each direction (with CGA for balance/stability) Sit to/from stand holding 10# kettlebell:  x10 with chest press, x10 with overhead press Farmer's carry with 5# in unilateral hand with high marching x10 (then switch hands x10) Standing kickstand position with alt hand reaching to cone on PT mat x10 bilat  Standing at barre performing clock touches 12/3/6 x10 bilat (with UE support of barre, as needed)   06/26/2023: Nustep level 5 x6 min with PT present to discuss status Single leg stance in parallel bars with UE support, as needed, 2x30 sec bilat Tandem gait on AirEx beam in parallel bars with UE support, as needed.  Down and back x3 Side stepping on AirEx beam in parallel bars with UE support, as needed.  Down and back x3 Static standing on flat side of bosu 2x1 min with UE support, as needed  FWD step ups onto bosu in parallel bars with UE support, as needed.  X10 bilat Side step up and over bosu parallel bars with UE support, as needed.  X10 bilat Leg Press (seat at 8) 100# 2x10 Standing on foam pad at barre:  hip abduction, hip extension.  2x10 each bilat Standing hamstring stretch on 3rd step:  2x20 sec bilat   06/19/2023: Nustep level 5 x4 min with PT present to discuss status 6 minute walk  test:  1182 ft with RPE of 1/10 (age related norms ar 1,999 ft) Seated hamstring stretch 2x20 sec bilat Tandem gait on AirEx beam in parallel bars with UE support, as needed.  Down and back x3 Side stepping on AirEx beam in parallel bars with UE support, as needed.  Down and back x3 Static standing on flat side of bosu 2x1 min with UE support, as needed  Marching on trampoline x2 min Leg Press (seat at 8) 100# 2x10   PATIENT EDUCATION:  Education details: POC; HEP compliance; HEP Person educated: Patient Education method: Explanation, Demonstration, and Handouts Education comprehension:  verbalized understanding, returned demonstration, and needs further education  HOME EXERCISE PROGRAM: Access Code: [email or web address] URL: [email or web address] Date: 06/05/2023 Prepared by: Chaneta Comer Shaw Antolin  Exercises - Sit to Stand with Armchair  - 1 x daily - 7 x weekly - 1 sets - 10 reps - Standing Hip Extension with Counter Support  - 1 x daily - 7 x weekly - 2 sets - 10 reps - Standing Hip Abduction with Counter Support  - 1 x daily - 7 x weekly - 2 sets - 10 reps - Heel Raises with Counter Support  - 1 x daily - 7 x weekly - 2 sets - 10 reps - Standing Single Leg Stance with Counter Support  - 1 x daily - 7 x weekly - 2 reps - 30 sec hold - Tandem Stance with Support  - 1 x daily - 7 x weekly - 2 reps - 30 sec hold - Step Sideways with Arms Reaching  - 1 x daily - 7 x weekly - 2 sets - 10 reps - Standing Reach to Side with Weight Shift  - 1 x daily - 7 x weekly - 2 sets - 10 reps - Romberg Stance on Foam Pad with Head Rotation  - 1 x daily - 7 x weekly - 2 sets - 10 reps - Narrow Stance with Eyes Closed and Head Nods on Foam Pad  - 1 x daily - 7 x weekly - 2 sets - 10 reps  ASSESSMENT:  CLINICAL IMPRESSION: Mr Peddy presents to skilled PT reporting that he has been able to walk his dog and is doing more activities at home.  Patient is progressing towards improved balance and stability.   Educated patient on the importance of performing his exercises at home, especially after discharge to allow him to keep progressing at home.  Patient verbalizes understanding.  Will likely perform discharge next visit.  OBJECTIVE IMPAIRMENTS: Abnormal gait, decreased activity tolerance, decreased balance, decreased endurance, difficulty walking, decreased strength, and postural dysfunction.     GOALS: Goals reviewed with patient? Yes  SHORT TERM GOALS: Target date: 06/05/2023  Patient will be independent with initial HEP. Baseline:  Goal status: Met on 4/1  2.  Patient will report > or = to 30% improvement in overall balance confidence since starting PT. Baseline:  Goal status: Met on 06/05/23 (reports 50% improvement)  3.  Patient will be able to complete sit to stand without UE support for improved functional strength. Baseline:  Goal status: Met on 06/05/23    LONG TERM GOALS: Target date: 07/03/2023  Patient will demonstrate independence in advanced HEP. Baseline:  Goal status: Ongoing  2.  Patient will report > or = to 60% improvement in balance confidence since starting PT. Baseline:  Goal status: Ongoing (reported 50% improvement on 06/05/23)   3.  Patient will score < or = to 12 sec on 5STS for improved functional strength. Baseline: 15.12 sec Goal status: Ongoing (see above)  4.  Patient will ambulate > or = to 668ft on for improved community negotiation.  Baseline:  Goal status: Goal Met on 06/05/23  5.  Patient will be able to ascend / descend a curb for improved community negotiation. Baseline:  Goal status: Ongoing     PLAN:  PT FREQUENCY: 2x/week  PT DURATION: 8 weeks  PLANNED INTERVENTIONS: 97164- PT Re-evaluation, 97110-Therapeutic exercises, 97530- Therapeutic activity, [id number]- Neuromuscular re-education, 97535- Self Care, 62952- Manual therapy, [id number]- Gait training, [phone]- Canalith repositioning, [id number]- Aquatic Therapy, 97016- Vasopneumatic device,  40981- Traction (mechanical), 19147- Ionotophoresis 4mg /ml Dexamethasone , Balance training, Stair training, Taping, Dry Needling, Joint mobilization, Joint manipulation, Spinal manipulation, Spinal mobilization, Vestibular training, Cryotherapy, and Moist heat  PLAN FOR NEXT SESSION:  Discharge if goals met    Robyne Christen, PT, DPT 06/28/23, 12:28 PM  [facility] [address], [address] [place], [place] [address] Phone # [phone] Fax [phone]

## 2023-07-02 NOTE — Telephone Encounter (Signed)
 Spoke with patient, doing well since ablation and has been monitoring his heart rhythm at home periodically. No needs at this time

## 2023-07-03 ENCOUNTER — Encounter: Payer: Self-pay | Admitting: Rehabilitative and Restorative Service Providers"

## 2023-07-03 ENCOUNTER — Ambulatory Visit: Admitting: Rehabilitative and Restorative Service Providers"

## 2023-07-03 DIAGNOSIS — R2681 Unsteadiness on feet: Secondary | ICD-10-CM | POA: Diagnosis not present

## 2023-07-03 DIAGNOSIS — R293 Abnormal posture: Secondary | ICD-10-CM

## 2023-07-03 DIAGNOSIS — M6281 Muscle weakness (generalized): Secondary | ICD-10-CM | POA: Diagnosis not present

## 2023-07-03 DIAGNOSIS — R2689 Other abnormalities of gait and mobility: Secondary | ICD-10-CM

## 2023-07-03 NOTE — Therapy (Signed)
 OUTPATIENT PHYSICAL THERAPY TREATMENT AND DISCHARGE SUMMARY   Patient Name: James Ibarra MRN: [id number] DOB:Dec 28, 1950, 73 y.o., male Today's Date: 07/03/2023   END OF SESSION:  PT End of Session - 07/03/23 1154     Visit Number 15    Date for PT Re-Evaluation 07/03/23    Authorization Type [employer] MCR    Authorization Time Period 06/05/2023 - 07/31/2023    Authorization - Visit Number 7    Authorization - Number of Visits 8    PT Start Time 1149    PT Stop Time 1230    PT Time Calculation (min) 41 min    Activity Tolerance Patient tolerated treatment well    Behavior During Therapy WFL for tasks assessed/performed              Past Medical History:  Diagnosis Date   Cancer (HCC)    Skin Cancer - Scalp, Right neck, Left Hand   Dysrhythmia    A. Fib while hospitalized June 2024   Heart murmur    History of nonmelanoma skin cancer    Hypercholesteremia    Hypertension    Pneumonia 08/2022   Trigeminal neuralgia    Past Surgical History:  Procedure Laterality Date   ATRIAL FIBRILLATION ABLATION N/A 05/24/2023   Procedure: ATRIAL FIBRILLATION ABLATION;  Surgeon: Efraim Grange, MD;  Location: [facility];  Service: Cardiovascular;  Laterality: N/A;   CATARACT EXTRACTION W/ INTRAOCULAR LENS IMPLANT Bilateral    CLIPPING OF ATRIAL APPENDAGE Left 12/08/2022   Procedure: CLIPPING OF ATRIAL APPENDAGE (LAA) USING 50 MM ATRICLIP;  Surgeon: Melene Sportsman, MD;  Location: [facility] OR;  Service: Open Heart Surgery;  Laterality: Left;   INGUINAL HERNIA REPAIR  11/07/2011   Procedure: LAPAROSCOPIC INGUINAL HERNIA;  Surgeon: Fran Imus, MD,FACS;  Location: [facility];  Service: General;  Laterality: Left;   MAZE N/A 12/08/2022   Procedure: MAZE;  Surgeon: Melene Sportsman, MD;  Location: [facility] OR;  Service: Open Heart Surgery;  Laterality: N/A;   MITRAL VALVE REPAIR N/A 12/08/2022   Procedure: MITRAL VALVE REPAIR (MVR) USING 34 MM SIMULUS SEMI- RIGID ANNULOPLASTY BAND;  Surgeon: Melene Sportsman, MD;  Location: [facility] OR;  Service: Open Heart Surgery;  Laterality: N/A;   RETINAL DETACHMENT SURGERY  03/07/1995   RIGHT/LEFT HEART CATH AND CORONARY ANGIOGRAPHY N/A 10/12/2022   Procedure: RIGHT/LEFT HEART CATH AND CORONARY ANGIOGRAPHY;  Surgeon: Kyra Phy, MD;  Location: [facility];  Service: Cardiovascular;  Laterality: N/A;   SKIN CANCER EXCISION  2012, 1999   basal cell - neck and hand   TEE WITHOUT CARDIOVERSION N/A 10/16/2022   Procedure: TRANSESOPHAGEAL ECHOCARDIOGRAM;  Surgeon: Jann Melody, MD;  Location: [facility];  Service: Cardiovascular;  Laterality: N/A;   TEE WITHOUT CARDIOVERSION N/A 12/08/2022   Procedure: TRANSESOPHAGEAL ECHOCARDIOGRAM;  Surgeon: Melene Sportsman, MD;  Location: [facility] OR;  Service: Open Heart Surgery;  Laterality: N/A;   Patient Active Problem List   Diagnosis Date Noted   Hypercoagulable state due to paroxysmal atrial fibrillation (HCC) 06/21/2023   S/P MVR (mitral valve repair) 12/08/2022   Protein-calorie malnutrition, severe 08/19/2022   Alcohol withdrawal (HCC) 08/14/2022   Acute metabolic encephalopathy 08/14/2022   Paroxysmal atrial fibrillation with RVR (HCC) 08/14/2022   Nonrheumatic mitral valve regurgitation 08/14/2022   Mitral valve prolapse 08/14/2022   PNA (pneumonia) 08/12/2022   Severe sepsis (HCC) 08/12/2022   HLD (hyperlipidemia) 08/12/2022   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   Trigeminal neuralgia of left  side of face 07/15/2019   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  States that he is ready for discharge today.  PERTINENT HISTORY: Skin cancer; HTN ; open heart surgery 2023  PAIN:  Are you having pain? No  PRECAUTIONS: None  WEIGHT BEARING RESTRICTIONS: No  FALLS:  Has patient fallen in last 6 months? No  LIVING ENVIRONMENT: Lives with: lives with their spouse Lives in: House/apartment Stairs: Yes: Internal: down to basement but rarely uses them steps; bilateral but cannot reach both and External: 8 steps; can reach both Has following equipment at home: Otho Blitz - 2 wheeled  OCCUPATION: Retired; Environmental health practitioner in pharmacology   PLOF: Independent, Independent with basic ADLs, Independent with household mobility without device, Independent with community mobility without device, Independent with gait, and Independent with transfers  PATIENT GOALS: Strengthening his legs and working on balance  NEXT MD VISIT: PRN  OBJECTIVE:  Note: Objective measures were completed at Evaluation unless otherwise noted.  PATIENT SURVEYS:  Eval:  ABC scale 71% moderate level of physical functioning 06/05/2023:  Total ABC score: 1540 / 1600 = 96.3 %  POSTURE: rounded shoulders and forward head   LOWER EXTREMITY MMT: Grossly 4/5    FUNCTIONAL TESTS:  Eval: 5 times sit to stand: 15.12 sec with UE support Timed up and go (TUG): 9.49sec 3 minute walk test: 561 RPE:0-1 SL Balance Rt 12 sec  Lt 8 sec with finger touch on counter; < 2 sec with no UE support  06/05/2023: 5 times sit to stand: 13.25 sec with only occasional pushing through thighs with forearms 3 minute walk test: 648 ft with RPE of 1-2/10 Single Leg Stance:  right- 2.53 sec, left- 4.16 sec  06/19/2023: 6 minute walk test:  1182 ft with RPE of 1/10 (age related norms ar 1,999 ft)  07/03/2023: 5 times sit to stand:  8.45 sec without increased  pain 6 minute walk test:  1,232 ft with RPE of 1/10  GAIT: Distance walked: 561FT Assistive device utilized: None Level of assistance: Complete Independence Comments: Wide BOS; decreased cadence  DYNAMIC GAIT INDEX: 3/6: 18/24  MINI-BESTest: Balance Evaluation Systems Test ANTICIPATORY: SIT TO STAND: (2) normal without use of hands and stabilizes independently     (1) Moderate comes to stand WITH use of hands on 1st attempt    (0) Severe: unable to stand up from chair without assistance OR needs several attempts with use of   hands Score: 2  2. RISE TO TOES: feet shoulder width apart. Hands on hips.  Rise as high as you can onto your toes. Try to hold this pose for 3 sec                          (2) stable for 3s with maximum height    (1) heels up, but not full range (smaller than when holding hands) OR noticeable instability for  3 sec                           (0) < or equat to 3 s  Score: 1  REACTIVE POSTURAL CONTROL 4. COMPENSATORY STEPPING CORRECTION FORWARD stand with feet shoulder width apart, arms at your sides. Lean forward against my hands beyond your forward limits.  When I let go, do whatever is necessary, including taking a step, to avoid a fall   (2) recovers independently  with a single, large step, to avoid a fall   (1) more than one step used to recover equilibrium   (0) No step OR would fall if not caught Score: 1  5. COMPENSATORY STEPPING CORRECTION BACKWARD: Lean backward against my hands beyond your backward limits.  When I let go, do whatever is necessary, including a step to avoid a fall. (2) recovers independently  with a single, large step, to avoid a fall   (1) more than one step used to recover equilibrium   (0) No step OR would fall if not caught Score: 1  6. COMPENSATORY STEPPING LATERAL: Lean into my hand beyond your sideways limit.  When I let go, do whatever is necessary, including taking a step, to avoid a fall. Left           Right   (2)  recovers independently with 1 step (crossover or lateral OK)   (1) several steps to recover equilibrium   (0) falls or cannot step Score: 1   SENSORY ORIENTATION 7. STANCE FEET TOGETHER EYES OPEN  firm surface Be as stable and still as possible until I say stop   (2) 30s   (1) < 30 s   (0) unable Score: 2  8. STANCE ON FOAM EYES CLOSED feet together, eyes closed, hands on hips. Be as stable and still as you can until I stay stop.  I will start timing when you close your eyes. Time in seconds:      (2) 30s   (1) <30 s   (0) unable Score: 1  9. INCLINE EYES CLOSED: Stand on incline with feet shoulder width apart, arms down at your sides.  I will start timing when you close your eyes   (2) Stands 30 s and aligns with gravity   (1) stands < 30 s OR aligns with surface   (0) unable Score: 1  DYNAMIC GAIT 10. CHANGE IN GAIT SPEED: begin walking at your normal speed, when I tell you "fast", walk as fast as you can, when I say "slow" walk very slowly   (2) significantly changes walking speed without imbalance   (1) unable to change walking speed or signs of imbalance   (0) unable to achieve significant change in walking speed AND signes of imbalance Score: 1  11. WALK WITH HEAD TURNS HORIZONTAL: begin walking at normal speed, when I say right, turn your head to the right, when I say left turn your head to the left.  Try to keep yourself walking in a straight line   (2) performs head turns with no change in gait speed and good balance   (1) performs head turns with reduction in gait speed   (0)  performs head turns with imbalance Score: 1  12. WALK WITH PIVOT TURNS: begin walking at your normal speed.  When I tell you to "turn and stop", turn as quickly as you can, face the opposite direction and stop.  After the turn,  your feet should be close together   (2) turns with feet close FAST in 3 steps with good balance   (1) turns with feet close SLOW > 4 steps with good balance   (0)  cannot turn with feet close at any speed without imbalance Score: 1  13. STEP OVER OBSTACLES:  ( 9 inch height 10 feet away from subject) begin walking at your normal speed.  When you get to the box, step over it, not around it and keep walking   (2) Able to step over box with minimal change of gait speed and good balance   (1) Steps over box but touches box OR displays cautious behavior by slowing gait   (0) Unable to step over box OR steps around box Score: 1                                                                                                                                 TREATMENT DATE:  07/03/2023: Nustep level 5 x6 min with PT present to discuss status Sit to/from stand 2x5 6 minute walk test:  1,232 ft outside with RPE of 1/10 Curb negotiation x5 Prone quad stretch with strap 2x20 sec bilat Tandem gait on AirEx beam in parallel bars with UE support, as needed.  Down and back x3 Side stepping on AirEx beam in parallel bars with UE support, as needed.  Down and back x3 Static standing on flat side of bosu 2x1 min with UE support, as needed    06/28/2023: Nustep level 5 x6 min with PT present to discuss status Standing hamstring stretch on 3rd step:  2x20 sec bilat 4 way resisted gait with 10# cable pulley x3 each direction (with CGA for balance/stability) Sit to/from stand holding 10# kettlebell:  x10 with chest press, x10 with overhead press Farmer's carry with 5# in unilateral hand with high marching x10 (then switch hands x10) Standing kickstand position with alt hand reaching to cone on PT mat x10 bilat  Standing at barre performing clock touches 12/3/6 x10 bilat (with UE support of barre, as needed)   06/26/2023: Nustep level 5 x6 min with PT present to discuss status Single leg stance in parallel bars with UE support, as needed, 2x30 sec bilat Tandem gait on AirEx beam in parallel bars with UE support, as needed.  Down and back x3 Side stepping on AirEx beam  in parallel bars with UE support, as needed.  Down and back x3 Static standing on flat side of bosu 2x1 min with UE support, as needed  FWD step ups onto bosu in parallel bars with UE support, as needed.  X10 bilat Side step up and over bosu parallel bars with UE support, as needed.  X10 bilat Leg Press (seat at 8) 100# 2x10 Standing on foam pad at barre:  hip abduction, hip extension.  2x10 each bilat Standing hamstring stretch on 3rd step:  2x20 sec bilat  PATIENT EDUCATION:  Education details: POC; HEP compliance; HEP Person educated: Patient Education method: Explanation, Demonstration, and Handouts Education comprehension: verbalized understanding, returned demonstration, and needs further education  HOME EXERCISE PROGRAM: Access Code: [email or web address] URL: [email or web address] Date: 06/05/2023 Prepared by: Chaneta Comer Mehran Guderian  Exercises - Sit to Stand with Armchair  - 1 x daily - 7 x weekly - 1 sets - 10 reps - Standing Hip Extension with Counter Support  - 1 x daily - 7 x weekly - 2 sets - 10 reps - Standing Hip Abduction with Counter Support  - 1 x daily - 7 x weekly - 2 sets - 10 reps - Heel Raises with Counter Support  - 1 x daily - 7 x weekly - 2 sets - 10 reps - Standing Single Leg Stance with Counter Support  - 1 x daily - 7 x weekly - 2 reps - 30 sec hold - Tandem Stance with Support  - 1 x daily - 7 x weekly - 2 reps - 30 sec hold - Step Sideways with Arms Reaching  - 1 x daily - 7 x weekly - 2 sets - 10 reps - Standing Reach to Side with Weight Shift  - 1 x daily - 7 x weekly - 2 sets - 10 reps - Romberg Stance on Foam Pad with Head Rotation  - 1 x daily - 7 x weekly - 2 sets - 10 reps - Narrow Stance with Eyes Closed and Head Nods on Foam Pad  - 1 x daily - 7 x weekly - 2 sets - 10 reps  ASSESSMENT:  CLINICAL IMPRESSION: Mr Camejo presents to skilled PT reporting that he feels that his balance is at least 60% better since starting PT.  Pt states that he has not  had any falls and was able to self correct his balance when his foot tripped up on a rug (after the cat had kicked up the rug).  Patient has met all long term goals and is ready for discharge.  Patient with improved safety and no new falls.  Patient is ready for discharge today to continue with HEP.  OBJECTIVE IMPAIRMENTS: Abnormal gait, decreased activity tolerance, decreased balance, decreased endurance, difficulty walking, decreased strength, and postural dysfunction.     GOALS: Goals reviewed with patient? Yes  SHORT TERM GOALS: Target date: 06/05/2023  Patient will be independent with initial HEP. Baseline:  Goal status: Met on 4/1  2.  Patient will report > or = to 30% improvement in overall balance confidence since starting PT. Baseline:  Goal status: Met on 06/05/23 (reports 50% improvement)  3.  Patient will be able to complete sit to stand without UE support for improved functional strength. Baseline:  Goal status: Met on 06/05/23    LONG TERM GOALS: Target date: 07/03/2023  Patient will demonstrate independence in advanced HEP. Baseline:  Goal status: Met on 07/03/23  2.  Patient will report > or = to 60% improvement in balance confidence since starting PT. Baseline:  Goal status: Met on 07/03/23 (reports 60% improvement)   3.  Patient will score < or = to 12 sec on 5STS for improved functional strength. Baseline: 15.12 sec Goal status: Met on 07/03/23  4.  Patient will ambulate > or = to 622ft on for improved community negotiation.  Baseline:  Goal status: Goal Met on 06/05/23  5.  Patient will be able to ascend / descend a curb for improved community negotiation. Baseline:  Goal status: Met on 07/03/23  PLAN:  PT FREQUENCY: 2x/week  PT DURATION: 8 weeks  PLANNED INTERVENTIONS: 97164- PT Re-evaluation, 97110-Therapeutic exercises, 97530- Therapeutic activity, [id number]- Neuromuscular re-education, 97535- Self Care, 40981- Manual therapy, [id number]- Gait  training, [phone]- Canalith repositioning, [id number]- Aquatic Therapy, 97016- Vasopneumatic device, [id number]- Traction (mechanical), [phone]- Ionotophoresis 4mg /ml Dexamethasone , Balance training, Stair training, Taping, Dry Needling, Joint mobilization, Joint manipulation, Spinal manipulation, Spinal mobilization, Vestibular training, Cryotherapy, and Moist heat   PHYSICAL THERAPY DISCHARGE SUMMARY  Patient agrees to discharge. Patient goals were met. Patient is being discharged due to meeting the stated rehab goals.     Robyne Christen, PT, DPT 07/03/23, 12:35 PM  [facility] [address] [place], [place] [address] Phone # [phone] Fax [phone]

## 2023-07-07 ENCOUNTER — Encounter (HOSPITAL_COMMUNITY): Payer: Self-pay | Admitting: *Deleted

## 2023-07-07 ENCOUNTER — Ambulatory Visit (INDEPENDENT_AMBULATORY_CARE_PROVIDER_SITE_OTHER)

## 2023-07-07 ENCOUNTER — Ambulatory Visit (HOSPITAL_COMMUNITY)
Admission: EM | Admit: 2023-07-07 | Discharge: 2023-07-07 | Disposition: A | Discharge status: Home / Self Care | Attending: Emergency Medicine | Admitting: Emergency Medicine

## 2023-07-07 DIAGNOSIS — S99912A Unspecified injury of left ankle, initial encounter: Secondary | ICD-10-CM

## 2023-07-07 DIAGNOSIS — Z8679 Personal history of other diseases of the circulatory system: Secondary | ICD-10-CM

## 2023-07-07 DIAGNOSIS — M25572 Pain in left ankle and joints of left foot: Secondary | ICD-10-CM

## 2023-07-07 HISTORY — DX: Unspecified atrial fibrillation: I48.91

## 2023-07-07 NOTE — ED Provider Notes (Signed)
 [facility]    CSN: [id number] Arrival date & time: 07/07/23  1202      History   Chief Complaint Chief Complaint  Patient presents with   Ankle Injury    HPI James Ibarra is a 73 y.o. male.   73 year old male patient, James Ibarra, presents to urgent care for evaluation of left ankle pain since twisting left ankle in kitchen last night unloading groceries.  Patient had difficulty bearing weight last night has slight improvement today.  Patient has been applying ice and started using a walker for stability.  The history is provided by the patient and a caregiver. No language interpreter was used.    Past Medical History:  Diagnosis Date   Atrial fibrillation (HCC)    Cancer (HCC)    Skin Cancer - Scalp, Right neck, Left Hand   Dysrhythmia    A. Fib while hospitalized June 2024   Heart murmur    History of nonmelanoma skin cancer    Hypercholesteremia    Hypertension    Pneumonia 08/2022   Trigeminal neuralgia     Patient Active Problem List   Diagnosis Date Noted   Left ankle injury, initial encounter 07/07/2023   Acute left ankle pain 07/07/2023   History of hypertension 07/07/2023   Hypercoagulable state due to paroxysmal atrial fibrillation (HCC) 06/21/2023   S/P MVR (mitral valve repair) 12/08/2022   Protein-calorie malnutrition, severe 08/19/2022   Alcohol withdrawal (HCC) 08/14/2022   Acute metabolic encephalopathy 08/14/2022   Paroxysmal atrial fibrillation with RVR (HCC) 08/14/2022   Nonrheumatic mitral valve regurgitation 08/14/2022   Mitral valve prolapse 08/14/2022   PNA (pneumonia) 08/12/2022   Severe sepsis (HCC) 08/12/2022   HLD (hyperlipidemia) 08/12/2022   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   Trigeminal neuralgia of left side of face 07/15/2019   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   Hypertension 10/31/2013   Pain in joint, shoulder region 04/14/2013   Nodule of right lung 11/07/2011    Past Surgical  History:  Procedure Laterality Date   ATRIAL FIBRILLATION ABLATION N/A 05/24/2023   Procedure: ATRIAL FIBRILLATION ABLATION;  Surgeon: Efraim Grange, MD;  Location: [facility];  Service: Cardiovascular;  Laterality: N/A;   CATARACT EXTRACTION W/ INTRAOCULAR LENS IMPLANT Bilateral    CLIPPING OF ATRIAL APPENDAGE Left 12/08/2022   Procedure: CLIPPING OF ATRIAL APPENDAGE (LAA) USING 50 MM ATRICLIP;  Surgeon: Melene Sportsman, MD;  Location: [facility];  Service: Open Heart Surgery;  Laterality: Left;   INGUINAL HERNIA REPAIR  11/07/2011   Procedure: LAPAROSCOPIC INGUINAL HERNIA;  Surgeon: Fran Imus, MD,FACS;  Location: [facility];  Service: General;  Laterality: Left;   MAZE N/A 12/08/2022   Procedure: MAZE;  Surgeon: Melene Sportsman, MD;  Location: [facility] OR;  Service: Open Heart Surgery;  Laterality: N/A;   MITRAL VALVE REPAIR N/A 12/08/2022   Procedure: MITRAL VALVE REPAIR (MVR) USING 34 MM SIMULUS SEMI- RIGID ANNULOPLASTY BAND;  Surgeon: Melene Sportsman, MD;  Location: [facility];  Service: Open Heart Surgery;  Laterality: N/A;   RETINAL DETACHMENT SURGERY  03/07/1995   RIGHT/LEFT HEART CATH AND CORONARY ANGIOGRAPHY N/A 10/12/2022   Procedure: RIGHT/LEFT HEART CATH AND CORONARY ANGIOGRAPHY;  Surgeon: Kyra Phy, MD;  Location: [facility];  Service: Cardiovascular;  Laterality: N/A;   SKIN CANCER EXCISION  2012, 1999   basal cell - neck and hand   TEE WITHOUT CARDIOVERSION N/A 10/16/2022   Procedure: TRANSESOPHAGEAL ECHOCARDIOGRAM;  Surgeon: Paulita Boss,  Mahesh A, MD;  Location: [facility];  Service: Cardiovascular;  Laterality: N/A;   TEE WITHOUT CARDIOVERSION N/A 12/08/2022   Procedure: TRANSESOPHAGEAL ECHOCARDIOGRAM;  Surgeon: Melene Sportsman, MD;  Location: [facility] OR;  Service: Open Heart Surgery;  Laterality: N/A;       Home Medications    Prior to Admission medications   Medication Sig Start Date End Date Taking? Authorizing Provider  apixaban  (ELIQUIS ) 5 MG TABS tablet Take  1 tablet (5 mg total) by mouth 2 (two) times daily. 08/25/22  Yes Ghimire, Estil Heman, MD  Ascorbic Acid (VITAMIN C PO) Take 750 mg by mouth daily.   Yes [provider]  Cholecalciferol (VITAMIN D) 50 MCG (2000 UT) tablet Take 2,000 Units by mouth daily.   Yes [provider]  folic acid  (FOLVITE ) 1 MG tablet Take 1 mg by mouth daily.   Yes [provider]  gabapentin  (NEURONTIN ) 300 MG capsule TAKE 2 CAPSULES BY MOUTH 4 TIMES DAILY Patient taking differently: Take 300 mg by mouth 2 (two) times daily. 10/10/22  Yes Phebe Brasil, MD  metoprolol  tartrate (LOPRESSOR ) 50 MG tablet Take 1 tablet (50 mg total) by mouth 2 (two) times daily. 05/14/23  Yes Gerald Kitty., NP  olmesartan (BENICAR) 40 MG tablet Take 40 mg by mouth daily.   Yes [provider]  pravastatin  (PRAVACHOL ) 20 MG tablet Take 20 mg by mouth daily.   Yes [provider]  thiamine  (VITAMIN B-1) 100 MG tablet Take 100 mg by mouth daily.   Yes [provider]  traZODone  (DESYREL ) 100 MG tablet Take 100-150 mg by mouth at bedtime.   Yes [provider]  acetaminophen  (TYLENOL ) 325 MG tablet Take 2 tablets (650 mg total) by mouth every 6 (six) hours. Patient taking differently: Take 650 mg by mouth as needed. 12/14/22   Roddenberry, Myron G, PA-C  hydrocortisone 2.5 % ointment Apply 1 Application topically 2 (two) times daily as needed (itching).    [provider]  ketoconazole (NIZORAL) 2 % cream Apply 1 Application topically as needed for irritation. 01/16/13   [provider]  Magnesium  400 MG TABS Take 400 mg by mouth daily.    [provider]  melatonin 3 MG TABS tablet Take 3 mg by mouth at bedtime as needed (sleep).    [provider]  Multiple Vitamins-Minerals (PRESERVISION AREDS 2) CAPS Take 1 capsule by mouth 2 (two) times daily.    [provider]  Polyethyl Glycol-Propyl Glycol (SYSTANE OP) Place 1 drop into both eyes  daily as needed (dry eyes).    [provider]  potassium chloride  (KLOR-CON ) 10 MEQ tablet Take 1 tablet (10 mEq total) by mouth daily. 05/23/23   Mealor, Augustus E, MD  sildenafil (REVATIO) 20 MG tablet Take 40-60 mg by mouth daily as needed (ED).    [provider]    Family History Family History  Problem Relation Age of Onset   Heart disease Father    Heart disease Mother     Social History Social History   Tobacco Use   Smoking status: Never   Smokeless tobacco: Never   Tobacco comments:    Never smoked 06/21/23  Vaping Use   Vaping status: Never Used  Substance Use Topics   Alcohol use: Not Currently   Drug use: No     Allergies   Patient has no known allergies.   Review of Systems Review of Systems  Constitutional:  Negative for fever.  Musculoskeletal:  Positive for arthralgias and gait problem.  Skin: Negative.  Negative for color change, pallor, rash and wound.  All other systems reviewed and are negative.    Physical Exam Triage Vital Signs ED Triage Vitals  Encounter Vitals Group     BP 07/07/23 1250 (!) 180/94     Systolic BP Percentile --      Diastolic BP Percentile --      Pulse Rate 07/07/23 1250 72     Resp 07/07/23 1250 16     Temp 07/07/23 1250 98.2 F (36.8 C)     Temp Source 07/07/23 1250 Oral     SpO2 07/07/23 1250 98 %     Weight --      Height --      Head Circumference --      Peak Flow --      Pain Score 07/07/23 1251 1     Pain Loc --      Pain Education --      Exclude from Growth Chart --    No data found.  Updated Vital Signs BP (!) 180/94   Pulse 72   Temp 98.2 F (36.8 C) (Oral)   Resp 16   SpO2 98%   Visual Acuity Right Eye Distance:   Left Eye Distance:   Bilateral Distance:    Right Eye Near:   Left Eye Near:    Bilateral Near:     Physical Exam Vitals and nursing note reviewed.  Constitutional:      General: He is not in acute distress.    Appearance: He is well-developed and  well-groomed.  HENT:     Head: Normocephalic and atraumatic.  Eyes:     Conjunctiva/sclera: Conjunctivae normal.  Cardiovascular:     Rate and Rhythm: Normal rate and regular rhythm.     Pulses:          Dorsalis pedis pulses are 2+ on the left side.     Heart sounds: No murmur heard. Pulmonary:     Effort: Pulmonary effort is normal. No respiratory distress.     Breath sounds: Normal breath sounds and air entry.  Abdominal:     Palpations: Abdomen is soft.     Tenderness: There is no abdominal tenderness.  Musculoskeletal:        General: No swelling.     Cervical back: Neck supple.       Feet:  Skin:    General: Skin is warm and dry.     Capillary Refill: Capillary refill takes less than 2 seconds.  Neurological:     General: No focal deficit present.     Mental Status: He is alert and oriented to person, place, and time.     GCS: GCS eye subscore is 4. GCS verbal subscore is 5. GCS motor subscore is 6.  Psychiatric:        Attention and Perception: Attention normal.        Mood and Affect: Mood normal.        Speech: Speech normal.        Behavior: Behavior normal. Behavior is cooperative.      UC Treatments / Results  Labs (all labs ordered are listed, but only abnormal results are displayed) Labs Reviewed - No data to display  EKG   Radiology DG Ankle Complete Left Result Date: 07/07/2023 CLINICAL DATA:  Left ankle pain from injury on 07/06/2023. Patient reports twisting left ankle last evening while unloading groceries in the  kitchen. EXAM: LEFT ANKLE COMPLETE - 3+ VIEW COMPARISON:  None FINDINGS: There is several thin curvilinear ossific densities along the dorsal aspect of the navicular bone with overlying soft tissue edema. The remaining osseous structures appear intact without signs of acute fracture or subluxation. IMPRESSION: Several thin curvilinear ossific densities along the dorsal aspect of the navicular bone with overlying soft tissue edema. These  findings may reflect an avulsion injury. Correlate for any focal tenderness over this area. Electronically Signed   By: Kimberley Penman M.D.   On: 07/07/2023 13:16    Procedures Procedures (including critical care time)  Medications Ordered in UC Medications - No data to display  Initial Impression / Assessment and Plan / UC Course  I have reviewed the triage vital signs and the nursing notes.  Pertinent labs & imaging results that were available during my care of the patient were reviewed by me and considered in my medical decision making (see chart for details).  Clinical Course as of 07/07/23 1351  Sat Jul 07, 2023  1327 Left ankle xray negative, no ttp @ navicular bone, will ace wrap and have pt follow up with his orthopedist next week [JD]    Clinical Course User Index [JD] Marjean Imperato, Eveleen Hinds, NP  Discussed exam findings and plan of care with patient, no acute fracture noted, patient has noted point tenderness in the navicular bone, Ace wrap applied by nursing staff, neurovascular intact pre and post ice application.  Patient has his own rollator, will use for support.  Patient has his own orthopedist will call for follow-up appointment next week, sooner if new or worsening issues.  Patient verbalized understanding to this provider  Ddx: Left ankle injury,left ankle sprain, History of hypertension with elevated blood pressure reading  Final Clinical Impressions(s) / UC Diagnoses   Final diagnoses:  Left ankle injury, initial encounter  Acute left ankle pain  History of hypertension     Discharge Instructions      Your xray is negative for acute findings Rest, Ice, Elevate,Wear ace wrap for support/comfort Follow up with your Orthopedist-call for appt next week if no improvemetn or worsening symptoms. Use your walker for support. May take tylenol  as label directed for pain.  Your BP was elevated in office today,make sure to take home meds as prescribed     ED  Prescriptions   None    PDMP not reviewed this encounter.   Peter [employer], NP 07/07/23 1352

## 2023-07-07 NOTE — ED Triage Notes (Signed)
 Reports twisting left ankle last night while unloading groceries in the kitchen. Had difficulty bearing weight last night - has slight improvement today. Has been applying ice and started using a walker for stability since injury. C/O continued pain.

## 2023-07-07 NOTE — Discharge Instructions (Addendum)
 Your xray is negative for acute findings Rest, Ice, Elevate,Wear ace wrap for support/comfort Follow up with your Orthopedist-call for appt next week if no improvemetn or worsening symptoms. Use your walker for support. May take tylenol  as label directed for pain.  Your BP was elevated in office today,make sure to take home meds as prescribed

## 2023-07-22 ENCOUNTER — Other Ambulatory Visit: Payer: Self-pay | Admitting: Neurology

## 2023-08-09 ENCOUNTER — Other Ambulatory Visit: Payer: Self-pay | Admitting: Neurology

## 2023-08-15 DIAGNOSIS — L578 Other skin changes due to chronic exposure to nonionizing radiation: Secondary | ICD-10-CM | POA: Diagnosis not present

## 2023-08-15 DIAGNOSIS — Z872 Personal history of diseases of the skin and subcutaneous tissue: Secondary | ICD-10-CM | POA: Diagnosis not present

## 2023-08-15 DIAGNOSIS — Z09 Encounter for follow-up examination after completed treatment for conditions other than malignant neoplasm: Secondary | ICD-10-CM | POA: Diagnosis not present

## 2023-08-24 ENCOUNTER — Ambulatory Visit: Admitting: Physician Assistant

## 2023-08-25 NOTE — Progress Notes (Unsigned)
 Patient ID: James Ibarra MRN: [id number] DOB/AGE: 03/24/1950 73 y.o.  Primary Care Physician:Morrow, Beverley, MD Primary Cardiologist: Wendel Haws  FOCUSED CARDIOVASCULAR PROBLEM LIST:   MR S/p complex surgical repair P2/P3/commisure, 34mm Simulus band, MAZE, Atriclip, and PFO closure 10/24 PAF S/p MAZE 10/24 S/p PVI 3/25 CV2 of 2 On Eliquis  Diastolic dysfunction G2 DD, status post mitral valve repair, EF 55 to 60% TTE November 2024 CAD Minimal, coronary angiography 2024 Hyperlipidemia LP(a) 11.7 Aortic atherosclerosis Chest CT 2024 Hypertension BMI 20    HISTORY OF PRESENT ILLNESS:  7/24: The patient is a 73 y.o. male with the indicated medical history here for recommendations regarding his mitral regurgitation.  The patient was admitted in June with a worsening shortness of breath and was found to have developed a right-sided pneumonia.  He was treated with ceftriaxone  and azithromycin .  He also developed new onset atrial fibrillation requiring Cardizem  drip.  An echocardiogram during this admission demonstrated severe mitral regurgitation thought to be due to posterior prolapse.  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  He was discharged to SNF on Eliquis  and metoprolol .  Since discharge he has quit using alcohol.  He is still trying to regain his strength.  He tells me he had just noticed palpitations especially when he lays on his left side.  This has been going on for about 2 months prior to being admitted to the hospital.  He is still regaining his strength and is somewhat short of breath at times.  He denies any paroxysmal nocturnal dyspnea, exertional angina, severe bleeding or bruising, signs or symptoms of stroke, or paroxysmal nocturnal dyspnea.  He tells me he is known about his murmur for at least 6 months.  His PCP noted it and he was referred to cardiology.  An echocardiogram in April demonstrated what looks to be  severe degenerative mitral regurgitation due to posterior prolapse.  He was scheduled to see us  in June however he was hospitalized and this was canceled.  He sees a Education officer, community on a regular basis and has no concerns about his dental health.  Plan:  Refer for coronary angiography and surgical opinion.  June 2025:  Patient consents to use of AI scribe. In the interim the patient underwent surgery mitral valve repair, maze, and atrial ligation in October 2024.  Had a relatively unremarkable postoperative course.  He was seen by EP for recurrent atrial fibrillation and underwent pulmonary vein isolation procedure in March.  Past Medical History:  Diagnosis Date   Atrial fibrillation (HCC)    Cancer (HCC)    Skin Cancer - Scalp, Right neck, Left Hand   Dysrhythmia    A. Fib while hospitalized June 2024   Heart murmur    History of nonmelanoma skin cancer    Hypercholesteremia    Hypertension    Pneumonia 08/2022   Trigeminal neuralgia     Past Surgical History:  Procedure Laterality Date   ATRIAL FIBRILLATION ABLATION N/A 05/24/2023   Procedure: ATRIAL FIBRILLATION ABLATION;  Surgeon: Nancey Eulas BRAVO, MD;  Location: [facility];  Service: Cardiovascular;  Laterality: N/A;   CATARACT EXTRACTION W/ INTRAOCULAR LENS IMPLANT Bilateral    CLIPPING OF ATRIAL APPENDAGE Left 12/08/2022   Procedure: CLIPPING OF ATRIAL APPENDAGE (LAA) USING 50 MM ATRICLIP;  Surgeon: Maryjane Mt, MD;  Location: [facility] OR;  Service: Open Heart Surgery;  Laterality: Left;   INGUINAL HERNIA REPAIR  11/07/2011   Procedure: LAPAROSCOPIC INGUINAL HERNIA;  Surgeon:  Camellia CHRISTELLA Blush, MD,FACS;  Location: [facility] OR[facility];  Service: General;  Laterality: Left;   MAZE N/A 12/08/2022   Procedure: MAZE;  Surgeon: Maryjane Mt, MD;  Location: [facility] OR;  Service: Open Heart Surgery;  Laterality: N/A;   MITRAL VALVE REPAIR N/A 12/08/2022   Procedure: MITRAL VALVE REPAIR (MVR) USING 34 MM SIMULUS SEMI- RIGID ANNULOPLASTY BAND;  Surgeon: Maryjane Mt, MD;  Location: [facility] OR;  Service: Open Heart Surgery;  Laterality: N/A;   RETINAL DETACHMENT SURGERY  03/07/1995   RIGHT/LEFT HEART CATH AND CORONARY ANGIOGRAPHY N/A 10/12/2022   Procedure: RIGHT/LEFT HEART CATH AND CORONARY ANGIOGRAPHY;  Surgeon: Wendel Lurena POUR, MD;  Location: [facility];  Service: Cardiovascular;  Laterality: N/A;   SKIN CANCER EXCISION  2012, 1999   basal cell - neck and hand   TEE WITHOUT CARDIOVERSION N/A 10/16/2022   Procedure: TRANSESOPHAGEAL ECHOCARDIOGRAM;  Surgeon: Santo Stanly LABOR, MD;  Location: [facility];  Service: Cardiovascular;  Laterality: N/A;   TEE WITHOUT CARDIOVERSION N/A 12/08/2022   Procedure: TRANSESOPHAGEAL ECHOCARDIOGRAM;  Surgeon: Maryjane Mt, MD;  Location: [facility] OR;  Service: Open Heart Surgery;  Laterality: N/A;    Family History  Problem Relation Age of Onset   Heart disease Father    Heart disease Mother     Social History   Socioeconomic History   Marital status: Married    Spouse name: Not on file   Number of children: Not on file   Years of education: Not on file   Highest education level: Not on file  Occupational History   Not on file  Tobacco Use   Smoking status: Never   Smokeless tobacco: Never   Tobacco comments:    Never smoked 06/21/23  Vaping Use   Vaping status: Never Used  Substance and Sexual Activity   Alcohol use: Not Currently   Drug use: No   Sexual activity: Not on file  Other Topics Concern   Not on file  Social History Narrative   Not on file   Social Drivers of Health   Financial Resource Strain: Not on file  Food Insecurity: No Food Insecurity (08/12/2022)   Hunger Vital Sign    Worried About Running Out of Food in the Last Year: Never true    Ran Out of Food in the Last Year: Never true  Transportation Needs: No Transportation Needs (08/12/2022)   PRAPARE - Administrator, Civil Service (Medical): No    Lack of Transportation (Non-Medical): No  Physical  Activity: Not on file  Stress: Not on file  Social Connections: Not on file  Intimate Partner Violence: Not At Risk (08/12/2022)   Humiliation, Afraid, Rape, and Kick questionnaire    Fear of Current or Ex-Partner: No    Emotionally Abused: No    Physically Abused: No    Sexually Abused: No     Prior to Admission medications   Medication Sig Start Date End Date Taking? Authorizing Provider  albuterol  (VENTOLIN  HFA) 108 (90 Base) MCG/ACT inhaler Inhale 1 puff into the lungs every 6 (six) hours as needed for wheezing or shortness of breath. 08/11/22   [provider]  apixaban  (ELIQUIS ) 5 MG TABS tablet Take 1 tablet (5 mg total) by mouth 2 (two) times daily. 08/25/22   Ghimire, Donalda CHRISTELLA, MD  folic acid  (FOLVITE ) 1 MG tablet Take 1 tablet (1 mg total) by mouth daily. 08/25/22   Ghimire, Donalda CHRISTELLA, MD  gabapentin  (NEURONTIN ) 100 MG capsule Take  2 capsules (200 mg total) by mouth 2 (two) times daily. 08/25/22   Ghimire, Donalda HERO, MD  ketoconazole (NIZORAL) 2 % cream Apply 1 Application topically as needed for irritation. 01/16/13   [provider]  melatonin 3 MG TABS tablet Take 3 mg by mouth at bedtime as needed.    [provider]  metoprolol  tartrate (LOPRESSOR ) 25 MG tablet Take 1 tablet (25 mg total) by mouth 2 (two) times daily. 08/25/22   Ghimire, Donalda HERO, MD  Multiple Vitamins-Minerals (ICAPS AREDS 2 PO) Take 1 capsule by mouth in the morning and at bedtime.    [provider]  pravastatin  (PRAVACHOL ) 10 MG tablet Take 10 mg by mouth at bedtime.    [provider]  sildenafil (REVATIO) 20 MG tablet Take 2-5 tablets for ED orally once a day if needed    [provider]  thiamine  (VITAMIN B-1) 100 MG tablet Take 1 tablet (100 mg total) by mouth daily. 08/25/22   Ghimire, Donalda HERO, MD  traZODone  (DESYREL ) 100 MG tablet Take 50-100 mg by mouth at bedtime as needed.     [provider]    No Known Allergies  REVIEW OF SYSTEMS:   General: no fevers/chills/night sweats Eyes: no blurry vision, diplopia, or amaurosis ENT: no sore throat or hearing loss Resp: no cough, wheezing, or hemoptysis CV: no edema or palpitations GI: no abdominal pain, nausea, vomiting, diarrhea, or constipation GU: no dysuria, frequency, or hematuria Skin: no rash Neuro: no headache, numbness, tingling, or weakness of extremities Musculoskeletal: no joint pain or swelling Heme: no bleeding, DVT, or easy bruising Endo: no polydipsia or polyuria  There were no vitals taken for this visit.  PHYSICAL EXAM: GEN:  AO x 3 in no acute distress HEENT: normal Dentition: Normal Neck: JVP normal. +2 carotid upstrokes without bruits. No thyromegaly. Lungs: equal expansion, clear bilaterally CV: Apex is discrete and nondisplaced, RRR with 3/6 holosystolic murmur Abd: soft, non-tender, non-distended; no bruit; positive bowel sounds Ext: no edema, ecchymoses, or cyanosis Vascular: 2+ femoral pulses, 2+ radial pulses       Skin: warm and dry without rash Neuro: CN II-XII grossly intact; motor and sensory grossly intact    DATA AND STUDIES:        Cardiac Studies & Procedures   ______________________________________________________________________________________________ CARDIAC CATHETERIZATION  CARDIAC CATHETERIZATION 10/12/2022  Conclusion 1.  Very minimal luminal abnormalities of LAD with otherwise normal right dominant circulation. 2.  Fick cardiac output of 4.9 L/min and Fick cardiac index of 2.6 L/min/m with the following hemodynamics: Right atrial pressure mean of 9 mmHg Right ventricular pressure 36/5 with an end-diastolic pressure of 18 mmHg Mean wedge pressure of 18 mmHg with V waves to 25 mmHg PA pressure of 38/16 with a mean of 23 mmHg PVR of less than 3 Woods units 3.  LVEDP of 26 mmHg.  Recommendation: Surgical opinion for degenerative mitral regurgitation and atrial fibrillation.  Findings Coronary Findings Diagnostic   Dominance: Right  Left Anterior Descending The vessel exhibits minimal luminal irregularities.  Intervention  No interventions have been documented.     ECHOCARDIOGRAM  ECHOCARDIOGRAM COMPLETE 01/19/2023  Narrative ECHOCARDIOGRAM REPORT    Patient Name:   NOELL SHULAR Date of Exam: 01/19/2023 Medical Rec #:  [id number]       Height:       73.0 in Accession #:    [id number]      Weight:       140.0 lb Date of Birth:  11-Jan-1951       BSA:          1.849 m Patient Age:    72 years        BP:           129/82 mmHg Patient Gender: M               HR:           71 bpm. Exam Location:  [address]  Procedure: 2D Echo, Cardiac Doppler and Color Doppler  Indications:    Z98.89 Mitral Valve Repair  History:        Patient has prior history of Echocardiogram examinations, most recent 12/08/2022. Mitral Valve Repair., Signs/Symptoms:Murmur; Risk Factors:Hypertension and Dyslipidemia.  Sonographer:    Carl Coma RDCS Referring Phys: [id number] Reiss Mowrey K Levell Tavano  IMPRESSIONS   1. Left ventricular ejection fraction, by estimation, is 55 to 60%. The left ventricle has normal function. The left ventricle has no regional wall motion abnormalities. There is mild asymmetric left ventricular hypertrophy of the basal-septal segment. Left ventricular diastolic parameters are consistent with Grade II diastolic dysfunction (pseudonormalization). 2. Right ventricular systolic function is mildly reduced. The right ventricular size is mildly enlarged. There is normal pulmonary artery systolic pressure. The estimated right ventricular systolic pressure is 33.0 mmHg. 3. Left atrial size was moderately dilated. 4. S/p mitral valve repair. Mean gradient 3 mmHg, no significant stenosis. No significant mitral regurgitation. 5. The aortic valve is tricuspid. There is mild calcification of the aortic valve. Aortic valve regurgitation is not visualized. No aortic stenosis is present. 6. The  inferior vena cava is dilated in size with >50% respiratory variability, suggesting right atrial pressure of 8 mmHg.  FINDINGS Left Ventricle: Left ventricular ejection fraction, by estimation, is 55 to 60%. The left ventricle has normal function. The left ventricle has no regional wall motion abnormalities. The left ventricular internal cavity size was normal in size. There is mild asymmetric left ventricular hypertrophy of the basal-septal segment. Left ventricular diastolic parameters are consistent with Grade II diastolic dysfunction (pseudonormalization).  Right Ventricle: The right ventricular size is mildly enlarged. No increase in right ventricular wall thickness. Right ventricular systolic function is mildly reduced. There is normal pulmonary artery systolic pressure. The tricuspid regurgitant velocity is 2.50 m/s, and with an assumed right atrial pressure of 8 mmHg, the estimated right ventricular systolic pressure is 33.0 mmHg.  Left Atrium: Left atrial size was moderately dilated.  Right Atrium: Right atrial size was normal in size.  Pericardium: There is no evidence of pericardial effusion.  S/p mitral valve repair. Mean gradient 3 mmHg, no significant stenosis. No significant mitral regurgitation. The mitral valve has been repaired/replaced. No evidence of mitral valve regurgitation. Tricuspid Valve: The tricuspid valve is normal in structure. Tricuspid valve regurgitation is mild.  Aortic Valve: The aortic valve is tricuspid. There is mild calcification of the aortic valve. Aortic valve regurgitation is not visualized. No aortic stenosis is present.  Pulmonic Valve: The pulmonic valve was normal in structure. Pulmonic valve regurgitation is trivial.  Aorta: The aortic root is normal in size and structure.  Venous: The inferior vena cava is dilated in size with greater than 50% respiratory variability, suggesting right atrial pressure of 8 mmHg.  IAS/Shunts: No atrial level  shunt detected by color flow Doppler.   LEFT VENTRICLE PLAX 2D LVIDd:         4.20 cm   Diastology LVIDs:         2.90  cm   LV e' medial:    6.96 cm/s LV PW:         1.10 cm   LV E/e' medial:  15.6 LV IVS:        1.10 cm   LV e' lateral:   9.41 cm/s LVOT diam:     2.10 cm   LV E/e' lateral: 11.5 LV SV:         78 LV SV Index:   42 LVOT Area:     3.46 cm   RIGHT VENTRICLE            IVC RV Basal diam:  4.80 cm    IVC diam: 2.10 cm RV S prime:     9.25 cm/s TAPSE (M-mode): 2.2 cm  LEFT ATRIUM              Index        RIGHT ATRIUM           Index LA diam:        4.60 cm  2.49 cm/m   RA Area:     17.80 cm LA Vol (A2C):   102.0 ml 55.17 ml/m  RA Volume:   57.20 ml  30.94 ml/m LA Vol (A4C):   85.3 ml  46.13 ml/m LA Biplane Vol: 94.0 ml  50.84 ml/m AORTIC VALVE LVOT Vmax:   116.50 cm/s LVOT Vmean:  83.850 cm/s LVOT VTI:    0.225 m  AORTA Ao Asc diam: 3.70 cm  MITRAL VALVE                TRICUSPID VALVE MV Area (PHT): 2.74 cm     TR Peak grad:   25.0 mmHg MV Area VTI:   2.04 cm     TR Vmax:        250.00 cm/s MV Peak grad:  4.5 mmHg MV Mean grad:  3.0 mmHg     SHUNTS MV Vmax:       1.06 m/s     Systemic VTI:  0.22 m MV Vmean:      74.5 cm/s    Systemic Diam: 2.10 cm MV Decel Time: 277 msec MV E velocity: 108.50 cm/s MV A velocity: 104.00 cm/s MV E/A ratio:  1.04  Dalton McleanMD Electronically signed by Ezra Kanner Signature Date/Time: 01/19/2023/1:01:00 PM    Final   TEE  ECHO INTRAOPERATIVE TEE 12/08/2022  Narrative *INTRAOPERATIVE TRANSESOPHAGEAL REPORT *    Patient Name:   KURON DOCKEN Date of Exam: 12/08/2022 Medical Rec #:  [id number]       Height:       73.0 in Accession #:    [id number]      Weight:       156.0 lb Date of Birth:  December 08, 1950       BSA:          1.94 m Patient Age:    72 years        BP:           147/79 mmHg Patient Gender: M               HR:           48 bpm. Exam Location:  Inpatient  Transesophogeal exam was  perform intraoperatively during surgical procedure. Patient was closely monitored under general anesthesia during the entirety of examination.  Indications:     mitral valve repair. Performing Phys: [id number] DEWARD KALLMAN Diagnosing Phys: Thom Peoples  Complications: No known complications during  this procedure. POST-OP IMPRESSIONS _ Left Ventricle: The left ventricle is unchanged from pre-bypass. Normal systolic function. _ Right Ventricle: The right ventricular function is mildly reduced. _ Aorta: The aorta appears unchanged from pre-bypass there is no dissection present in the aorta. _ Aortic Valve: The aortic valve appears unchanged from pre-bypass. _ Mitral Valve: There is an annuloplasty ring in the mitral position. Following mitral valve repair, there is no residual mitral regurgitation. Mean PG 2 mmHg. _ Tricuspid Valve: The tricuspid valve appears unchanged from pre-bypass. _ Pulmonic Valve: The pulmonic valve appears unchanged from pre-bypass. _ Pericardium: The pericardium appears unchanged from pre-bypass. _ Comments:  Following Mitral valve repair, there is no residual mitral regurgitation, there is no mitral stenosis. The left ventricular function is normal. Right ventricular function is mildly reduced. The remainder of the valvular exam is unchanged from pre-bypass examination.  PRE-OP FINDINGS Left Ventricle: The left ventricle has normal systolic function, with an ejection fraction of 57.6% by Simpson's biplane method of discs.   Right Ventricle: The right ventricle has normal systolic function. The cavity was mildly dialated.  Left Atrium: Left atrial size was dilated. No left atrial/left atrial appendage thrombus was detected.  Interatrial Septum: There is a patent foramen ovale with a left to right shunt.  Pericardium: There is no evidence of pericardial effusion. There is no pleural effusion.  Mitral Valve: The mitral valve is myxomatous. There is prolapse of  the P3 segment of the posterior mitral leaflet with moderate to severe regurgitation by color flow Doppler. The jet is eccentrically directed towards the anterior wall of the left atrium. There is No evidence of mitral stenosis.  Tricuspid Valve: The tricuspid valve was normal in structure. Tricuspid valve regurgitation is trivial and is isolated to pulmonary artery catheter site.  Aortic Valve: The aortic valve is normal in structure. 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 Aortic valve regurgitation was not assessed by color flow Doppler. There is no stenosis of the aortic valve, with a calculated valve area of 3.39 cm.   Pulmonic Valve: The pulmonic valve was normal in structure. Pulmonic valve regurgitation is trivial by color flow Doppler.   Aorta: The aortic root, ascending aorta and aortic arch are normal in size and structure.  Pulmonary Artery: Norva Purl catheter vizualized past pulmonic valve in the main PA. The pulmonary artery is of normal size.  +--------------+--------++ LEFT VENTRICLE               +----------------+---------++ +--------------+--------++       Diastology                PLAX 2D                      +----------------+---------++ +--------------+--------++       LV e' lateral:  7.10 cm/s LVOT diam:    2.30 cm        +----------------+---------++ +--------------+--------++       LV E/e' lateral:8.5       LVOT Area:    4.15 cm       +----------------+---------++ +--------------+--------++                        +--------------+--------++  +------------------+---------++ LV Volumes (MOD)            +------------------+---------++ LV area d, A2C:   30.30 cm +------------------+---------++ LV area d, A4C:   33.60 cm +------------------+---------++ LV area s, A2C:  18.00 cm +------------------+---------++ LV area s, A4C:   19.60 cm +------------------+---------++ LV  major d, A2C:  8.63 cm   +------------------+---------++ LV major d, A4C:  7.90 cm   +------------------+---------++ LV major s, A2C:  7.01 cm   +------------------+---------++ LV major s, A4C:  6.68 cm   +------------------+---------++ LV vol d, MOD A2C:94.7 ml   +------------------+---------++ LV vol d, MOD A4C:117.0 ml  +------------------+---------++ LV vol s, MOD A2C:41.7 ml   +------------------+---------++ LV vol s, MOD A4C:48.8 ml   +------------------+---------++ LV SV MOD A2C:    53.0 ml   +------------------+---------++ LV SV MOD A4C:    117.0 ml  +------------------+---------++ LV SV MOD BP:     61.3 ml   +------------------+---------++  +---------------+------+-------+ RIGHT VENTRICLE              +---------------+------+-------+ RV FAC:        33.1 %        +---------------+------+-------+ TAPSE (M-mode):2.8 cm2.37 cm +---------------+------+-------+  +------------------+-----------++ AORTIC VALVE                  +------------------+-----------++ AV Area (Vmax):   3.46 cm    +------------------+-----------++ AV Area (Vmean):  3.35 cm    +------------------+-----------++ AV Area (VTI):    3.39 cm    +------------------+-----------++ AV Vmax:          128.50 cm/s +------------------+-----------++ AV Vmean:         78.400 cm/s +------------------+-----------++ AV VTI:           0.251 m     +------------------+-----------++ AV Peak Grad:     6.6 mmHg    +------------------+-----------++ AV Mean Grad:     3.0 mmHg    +------------------+-----------++ LVOT Vmax:        107.05 cm/s +------------------+-----------++ LVOT Vmean:       63.150 cm/s +------------------+-----------++ LVOT VTI:         0.205 m     +------------------+-----------++ LVOT/AV VTI ratio:0.81        +------------------+-----------++  +--------------+-------++ AORTA                  +--------------+-------++ Ao Sinus diam:3.20 cm +--------------+-------++ Ao STJ diam:  3.1 cm  +--------------+-------++  +--------------+-----------++ MITRAL VALVE               +--------------+-------+ +--------------+-----------++  SHUNTS                MV Area (PHT):2.00 cm     +--------------+-------+ +--------------+-----------++  Systemic VTI: 0.20 m  MV Peak grad: 5.2 mmHg     +--------------+-------+ +--------------+-----------++  Systemic Diam:2.30 cm MV Mean grad: 2.5 mmHg     +--------------+-------+ +--------------+-----------++ MV Vmax:      1.14 m/s    +--------------+-----------++ MV Vmean:     68.5 cm/s   +--------------+-----------++ MV VTI:       0.30 m      +--------------+-----------++ MV PHT:       110.20 msec +--------------+-----------++ MV Decel Time:380 msec    +--------------+-----------++ +--------------+----------++ MV E velocity:60.20 cm/s +--------------+----------++ MV A velocity:39.00 cm/s +--------------+----------++ MV E/A ratio: 1.54       +--------------+----------++   Thom Peoples Electronically signed by Thom Peoples Signature Date/Time: 12/08/2022/12:36:21 PM    Final  MONITORS  LONG TERM MONITOR (3-14 DAYS) 01/26/2023  Narrative Patch Wear Time:  13 days and 19 hours (2024-11-03T16:06:44-0500 to 2024-11-17T11:13:31-0500)  Patient had a min HR of 52 bpm, max HR of 174 bpm, and avg HR of 83 bpm. Predominant underlying  rhythm was Sinus Rhythm.  EVENTS: -3 Ventricular Tachycardia runs occurred, the run with the fastest interval lasting 6 beats with a max rate of 174 bpm (avg 137 bpm); the run with the fastest interval was also the longest.  -13 Supraventricular Tachycardia runs occurred, the run with the fastest interval lasting 7 beats with a max rate of 156 bpm, the longest lasting 16 beats with an avg rate of 124 bpm.  -Atrial Fibrillation occurred  (12% burden), ranging from 59-170 bpm (avg of 112 bpm), the longest lasting 1 day 8 hours with an avg rate of 109 bpm.  -Isolated SVEs were rare (<1.0%), SVE Couplets were rare (<1.0%), and SVE Triplets were rare (<1.0%). -Isolated Ves were rare (<1.0%, 2632), VE Couplets were rare (<1.0%, 29), and VE Triplets were rare (<1.0%, 2). Ventricular Bigeminy was present.  No sustained ventricular tachyarrhythmias or bradyarrhythmias were detected.  No patient triggered events.  Summary: High burden of atrial fibrillation; patient is on chronic anticoagulation.       ______________________________________________________________________________________________      12/06/2022: ALT 20 12/11/2022: Magnesium  1.7 05/21/2023: Platelets 213 05/24/2023: BUN 14; Creatinine, Ser 1.00; Hemoglobin 12.6; Potassium 3.8; Sodium 142        ASSESSMENT AND PLAN:   S/P MVR (mitral valve repair)  Paroxysmal atrial fibrillation (HCC)  Secondary hypercoagulable state (HCC)  Diastolic dysfunction  Coronary artery disease involving native coronary artery of native heart without angina pectoris  Hyperlipidemia LDL goal <70  Aortic atherosclerosis (HCC)  Primary hypertension  Status post mitral valve repair: Postoperative echocardiogram in November demonstrated a very good repair with a normal ejection fraction with grade 2 diastolic dysfunction. PAF: Followed by atrial fibrillation clinic; continue Eliquis  5 mg twice daily, Lopressor  50 mg twice daily. Secondary hypercoagulable state: Continue Eliquis  5 mg twice daily Diastolic dysfunction: Continue olmesartan 40 mg daily, Lopressor  50 mg twice daily Jardiance 10 mg daily.*** CAD: Mild, Eliquis  5 mg twice daily and pravastatin  20 mg daily Hyperlipidemia: Continue pravastatin  20 mg; check lipid panel and LFTs today.*** Aortic atherosclerosis: Continue Eliquis  5 mg twice daily, pravastatin  20 mg daily. Hypertension: Continue olmesartan 40 mg daily,  Lopressor  50 mg twice daily.***  I spent *** minutes reviewing all clinical data during and prior to this visit including all relevant imaging studies, laboratories, clinical information from other health systems and prior notes from both Cardiology and other specialties, interviewing the patient, conducting a complete physical examination, and coordinating care in order to formulate a comprehensive and personalized evaluation and treatment plan.   Fayetta Sorenson K Addie Cederberg, MD  08/25/2023 7:39 AM    [facility] [address] [place], [place], [place]  [address] Phone: [phone]; Fax: [phone]

## 2023-08-28 NOTE — Telephone Encounter (Signed)
 Phone rep called pt and scheduled an appointment with wait list

## 2023-08-28 NOTE — Progress Notes (Unsigned)
  Electrophysiology Office Note:   Date:  08/30/2023  ID:  James Ibarra, DOB 26-Feb-1951, MRN [id number]  Primary Cardiologist: Arun K Thukkani, MD Electrophysiologist: Eulas FORBES Furbish, MD      History of Present Illness:   James Ibarra is a 73 y.o. male with h/o MR s/p repair, HTN, HLD, CAD, and atrial fibrillation seen today for routine electrophysiology followup.   Since last being seen in our clinic the patient reports doing OK. No breakthrough AF of which he is aware.  Saw Dr. Wendel this morning and meds adjusted for BP. Otherwise,  he denies chest pain, palpitations, dyspnea, PND, orthopnea, nausea, vomiting, dizziness, syncope, edema, weight gain, or early satiety.   Review of systems complete and found to be negative unless listed in HPI.   EP Information / Studies Reviewed:    EKG is ordered today. Personal review as below.  EKG Interpretation Date/Time:  Thursday August 30 2023 09:40:54 EDT Ventricular Rate:  49 PR Interval:  188 QRS Duration:  82 QT Interval:  454 QTC Calculation: 410 R Axis:   -31  Text Interpretation: Sinus bradycardia Left axis deviation When compared with ECG of 21-Jun-2023 13:58, QRS axis Shifted left Confirmed by Lesia Sharper [phone]) on 08/30/2023 10:02:17 AM    Arrhythmia/Device History S/p PVI 05/24/2023   Physical Exam:   VS:  BP (!) 150/82   Pulse (!) 49   Ht 6' 1 (1.854 m)   Wt 161 lb 12.8 oz (73.4 kg)   SpO2 97%   BMI 21.35 kg/m    Wt Readings from Last 3 Encounters:  08/30/23 161 lb 12.8 oz (73.4 kg)  08/30/23 157 lb (71.2 kg)  06/21/23 159 lb (72.1 kg)     GEN: No acute distress NECK: No JVD; No carotid bruits CARDIAC: Regular rate and rhythm, no murmurs, rubs, gallops RESPIRATORY:  Clear to auscultation without rales, wheezing or rhonchi  ABDOMEN: Soft, non-tender, non-distended EXTREMITIES:  No edema; No deformity   ASSESSMENT AND PLAN:    Paroxysmal Atrial Fibrillation (ICD10:  I48.0) The patient's  CHA2DS2-VASc score is 2, indicating a 2.2% annual risk of stroke.   S/p MAZE 12/2022 S/p afib ablation 05/24/23 EKG today shows sinus bradycardia  Off amiodarone  STOP Eliquis  with patent LAA clipping by CT. (Discussed with Dr. Wendel and Dr. Furbish who both agree) Continue Lopressor  50 mg BID   Secondary hypercoagulable state Pt on Eliquis  as above  S/p LAA clipping 12/2022   HTN Stable on current regimen   CAD No s/s of ischemia.     CAC score 205 on CT Followed by Dr Wendel    VHD Severe MR s/p repair 12/08/22   Follow up with EP Team in 6 months, then as needed for further EP needs.  Signed, Sharper Prentice Lesia, PA-C

## 2023-08-30 ENCOUNTER — Ambulatory Visit: Admitting: Student

## 2023-08-30 ENCOUNTER — Encounter: Payer: Self-pay | Admitting: Student

## 2023-08-30 ENCOUNTER — Ambulatory Visit: Payer: Medicare Other | Attending: Internal Medicine | Admitting: Internal Medicine

## 2023-08-30 ENCOUNTER — Encounter: Payer: Self-pay | Admitting: Internal Medicine

## 2023-08-30 VITALS — BP 166/88 | HR 58 | Ht 73.0 in | Wt 157.0 lb

## 2023-08-30 VITALS — BP 150/82 | HR 49 | Ht 73.0 in | Wt 161.8 lb

## 2023-08-30 DIAGNOSIS — E785 Hyperlipidemia, unspecified: Secondary | ICD-10-CM

## 2023-08-30 DIAGNOSIS — I700 Atherosclerosis of aorta: Secondary | ICD-10-CM | POA: Diagnosis not present

## 2023-08-30 DIAGNOSIS — I2510 Atherosclerotic heart disease of native coronary artery without angina pectoris: Secondary | ICD-10-CM

## 2023-08-30 DIAGNOSIS — Z9889 Other specified postprocedural states: Secondary | ICD-10-CM

## 2023-08-30 DIAGNOSIS — D6869 Other thrombophilia: Secondary | ICD-10-CM

## 2023-08-30 DIAGNOSIS — I480 Paroxysmal atrial fibrillation: Secondary | ICD-10-CM | POA: Diagnosis not present

## 2023-08-30 DIAGNOSIS — I10 Essential (primary) hypertension: Secondary | ICD-10-CM | POA: Diagnosis not present

## 2023-08-30 DIAGNOSIS — I5189 Other ill-defined heart diseases: Secondary | ICD-10-CM

## 2023-08-30 DIAGNOSIS — Z01810 Encounter for preprocedural cardiovascular examination: Secondary | ICD-10-CM

## 2023-08-30 MED ORDER — TELMISARTAN 40 MG PO TABS: 40.0000 mg | ORAL_TABLET | Freq: Every day | ORAL | 3 refills | Status: AC

## 2023-08-30 MED ORDER — ROSUVASTATIN CALCIUM 20 MG PO TABS: 20.0000 mg | ORAL_TABLET | Freq: Every day | ORAL | 3 refills | Status: AC

## 2023-08-30 NOTE — Patient Instructions (Addendum)
 Medication Instructions:  Your physician has recommended you make the following change in your medication:  1.) stop olmesartan 2.) stop pravastain 3.) start telmisartan 40 mg - one tablet daily 4.) start Crestor 20 mg - one tablet daily  *If you need a refill on your cardiac medications before your next appointment, please call your pharmacy*  Lab Work: First floor lab in about 8 weeks - lipids/liver function  If you have labs (blood work) drawn today and your tests are completely normal, you will receive your results only by: MyChart Message (if you have MyChart) OR A paper copy in the mail If you have any lab test that is abnormal or we need to change your treatment, we will call you to review the results.  Testing/Procedures: none  Follow-Up: At [facility], you and your health needs are our priority.  As part of our continuing mission to provide you with exceptional heart care, our providers are all part of one team.  This team includes your primary Cardiologist (physician) and Advanced Practice Providers or APPs (Physician Assistants and Nurse Practitioners) who all work together to provide you with the care you need, when you need it.  Your next appointment:   6 month(s)  Provider:   One of our Advanced Practice Providers (APPs): Morse Clause, PA-C  Lamarr Satterfield, NP Miriam Shams, NP  Olivia Pavy, PA-C Josefa Beauvais, NP  Leontine Salen, PA-C Orren Fabry, PA-C  [place], PA-C Ernest Dick, NP  Damien Braver, NP Jon Hails, PA-C  Waddell Donath, PA-C    Dayna Dunn, PA-C  Scott Weaver, PA-C Lum Louis, NP Katlyn West, NP Callie Goodrich, PA-C  Evan Williams, PA-C Sheng Haley, PA-C  Xika Zhao, NP Kathleen Johnson, PA-C    Other Instructions Nurse visit for blood pressure check in about one week

## 2023-08-30 NOTE — Patient Instructions (Signed)
 Medication Instructions:  Stop eliquis  *If you need a refill on your cardiac medications before your next appointment, please call your pharmacy*  Lab Work: None ordered If you have labs (blood work) drawn today and your tests are completely normal, you will receive your results only by: MyChart Message (if you have MyChart) OR A paper copy in the mail If you have any lab test that is abnormal or we need to change your treatment, we will call you to review the results.  Follow-Up: At [facility], you and your health needs are our priority.  As part of our continuing mission to provide you with exceptional heart care, our providers are all part of one team.  This team includes your primary Cardiologist (physician) and Advanced Practice Providers or APPs (Physician Assistants and Nurse Practitioners) who all work together to provide you with the care you need, when you need it.  Your next appointment:   6 month(s)  Provider:   You may see Eulas FORBES Furbish, MD or one of the following Advanced Practice Providers on your designated Care Team:   Charlies Arthur, [place] Ozell Jodie Passey, PA-C Suzann Riddle, NP Daphne Barrack, NP

## 2023-09-04 ENCOUNTER — Inpatient Hospital Stay (HOSPITAL_COMMUNITY)

## 2023-09-04 ENCOUNTER — Other Ambulatory Visit: Payer: Self-pay

## 2023-09-04 ENCOUNTER — Inpatient Hospital Stay (HOSPITAL_COMMUNITY)
Admission: EM | Admit: 2023-09-04 | Discharge: 2023-09-10 | DRG: 956 | Disposition: A | Discharge status: Skilled Nursing Facility | Attending: Internal Medicine | Admitting: Internal Medicine

## 2023-09-04 ENCOUNTER — Emergency Department (HOSPITAL_COMMUNITY)

## 2023-09-04 ENCOUNTER — Encounter (HOSPITAL_COMMUNITY): Payer: Self-pay

## 2023-09-04 DIAGNOSIS — Z8249 Family history of ischemic heart disease and other diseases of the circulatory system: Secondary | ICD-10-CM | POA: Diagnosis not present

## 2023-09-04 DIAGNOSIS — M6282 Rhabdomyolysis: Secondary | ICD-10-CM | POA: Diagnosis not present

## 2023-09-04 DIAGNOSIS — S20211A Contusion of right front wall of thorax, initial encounter: Secondary | ICD-10-CM | POA: Diagnosis not present

## 2023-09-04 DIAGNOSIS — Y92008 Other place in unspecified non-institutional (private) residence as the place of occurrence of the external cause: Secondary | ICD-10-CM | POA: Diagnosis not present

## 2023-09-04 DIAGNOSIS — S72002D Fracture of unspecified part of neck of left femur, subsequent encounter for closed fracture with routine healing: Secondary | ICD-10-CM | POA: Diagnosis not present

## 2023-09-04 DIAGNOSIS — S72032A Displaced midcervical fracture of left femur, initial encounter for closed fracture: Secondary | ICD-10-CM | POA: Diagnosis present

## 2023-09-04 DIAGNOSIS — I5032 Chronic diastolic (congestive) heart failure: Secondary | ICD-10-CM | POA: Diagnosis present

## 2023-09-04 DIAGNOSIS — M25571 Pain in right ankle and joints of right foot: Secondary | ICD-10-CM | POA: Diagnosis not present

## 2023-09-04 DIAGNOSIS — M5020 Other cervical disc displacement, unspecified cervical region: Secondary | ICD-10-CM | POA: Diagnosis not present

## 2023-09-04 DIAGNOSIS — K5730 Diverticulosis of large intestine without perforation or abscess without bleeding: Secondary | ICD-10-CM | POA: Diagnosis not present

## 2023-09-04 DIAGNOSIS — T796XXA Traumatic ischemia of muscle, initial encounter: Secondary | ICD-10-CM | POA: Diagnosis not present

## 2023-09-04 DIAGNOSIS — I499 Cardiac arrhythmia, unspecified: Secondary | ICD-10-CM | POA: Diagnosis not present

## 2023-09-04 DIAGNOSIS — G319 Degenerative disease of nervous system, unspecified: Secondary | ICD-10-CM | POA: Diagnosis not present

## 2023-09-04 DIAGNOSIS — I10 Essential (primary) hypertension: Secondary | ICD-10-CM | POA: Diagnosis not present

## 2023-09-04 DIAGNOSIS — S72002A Fracture of unspecified part of neck of left femur, initial encounter for closed fracture: Secondary | ICD-10-CM | POA: Diagnosis not present

## 2023-09-04 DIAGNOSIS — S72001D Fracture of unspecified part of neck of right femur, subsequent encounter for closed fracture with routine healing: Secondary | ICD-10-CM | POA: Diagnosis not present

## 2023-09-04 DIAGNOSIS — I6782 Cerebral ischemia: Secondary | ICD-10-CM | POA: Diagnosis not present

## 2023-09-04 DIAGNOSIS — E440 Moderate protein-calorie malnutrition: Secondary | ICD-10-CM | POA: Diagnosis not present

## 2023-09-04 DIAGNOSIS — S79929A Unspecified injury of unspecified thigh, initial encounter: Secondary | ICD-10-CM | POA: Diagnosis not present

## 2023-09-04 DIAGNOSIS — I4891 Unspecified atrial fibrillation: Secondary | ICD-10-CM | POA: Diagnosis not present

## 2023-09-04 DIAGNOSIS — E876 Hypokalemia: Secondary | ICD-10-CM | POA: Diagnosis not present

## 2023-09-04 DIAGNOSIS — R29703 NIHSS score 3: Secondary | ICD-10-CM | POA: Diagnosis not present

## 2023-09-04 DIAGNOSIS — Z7401 Bed confinement status: Secondary | ICD-10-CM | POA: Diagnosis not present

## 2023-09-04 DIAGNOSIS — M47812 Spondylosis without myelopathy or radiculopathy, cervical region: Secondary | ICD-10-CM | POA: Diagnosis not present

## 2023-09-04 DIAGNOSIS — W1839XA Other fall on same level, initial encounter: Secondary | ICD-10-CM | POA: Diagnosis present

## 2023-09-04 DIAGNOSIS — I6529 Occlusion and stenosis of unspecified carotid artery: Secondary | ICD-10-CM | POA: Diagnosis not present

## 2023-09-04 DIAGNOSIS — I639 Cerebral infarction, unspecified: Secondary | ICD-10-CM | POA: Diagnosis not present

## 2023-09-04 DIAGNOSIS — S72113A Displaced fracture of greater trochanter of unspecified femur, initial encounter for closed fracture: Secondary | ICD-10-CM | POA: Diagnosis not present

## 2023-09-04 DIAGNOSIS — Z9181 History of falling: Secondary | ICD-10-CM | POA: Diagnosis not present

## 2023-09-04 DIAGNOSIS — I480 Paroxysmal atrial fibrillation: Secondary | ICD-10-CM | POA: Diagnosis present

## 2023-09-04 DIAGNOSIS — Z6820 Body mass index (BMI) 20.0-20.9, adult: Secondary | ICD-10-CM | POA: Diagnosis not present

## 2023-09-04 DIAGNOSIS — Z85828 Personal history of other malignant neoplasm of skin: Secondary | ICD-10-CM | POA: Diagnosis not present

## 2023-09-04 DIAGNOSIS — F05 Delirium due to known physiological condition: Secondary | ICD-10-CM | POA: Diagnosis not present

## 2023-09-04 DIAGNOSIS — E7800 Pure hypercholesterolemia, unspecified: Secondary | ICD-10-CM | POA: Diagnosis not present

## 2023-09-04 DIAGNOSIS — R1310 Dysphagia, unspecified: Secondary | ICD-10-CM | POA: Diagnosis not present

## 2023-09-04 DIAGNOSIS — I2510 Atherosclerotic heart disease of native coronary artery without angina pectoris: Secondary | ICD-10-CM | POA: Diagnosis not present

## 2023-09-04 DIAGNOSIS — Z952 Presence of prosthetic heart valve: Secondary | ICD-10-CM

## 2023-09-04 DIAGNOSIS — S301XXA Contusion of abdominal wall, initial encounter: Secondary | ICD-10-CM | POA: Diagnosis not present

## 2023-09-04 DIAGNOSIS — R55 Syncope and collapse: Secondary | ICD-10-CM | POA: Diagnosis not present

## 2023-09-04 DIAGNOSIS — R7989 Other specified abnormal findings of blood chemistry: Secondary | ICD-10-CM

## 2023-09-04 DIAGNOSIS — Z79899 Other long term (current) drug therapy: Secondary | ICD-10-CM

## 2023-09-04 DIAGNOSIS — W19XXXA Unspecified fall, initial encounter: Principal | ICD-10-CM

## 2023-09-04 DIAGNOSIS — Z471 Aftercare following joint replacement surgery: Secondary | ICD-10-CM | POA: Diagnosis not present

## 2023-09-04 DIAGNOSIS — M47811 Spondylosis without myelopathy or radiculopathy, occipito-atlanto-axial region: Secondary | ICD-10-CM | POA: Diagnosis not present

## 2023-09-04 DIAGNOSIS — Z01810 Encounter for preprocedural cardiovascular examination: Secondary | ICD-10-CM | POA: Diagnosis not present

## 2023-09-04 DIAGNOSIS — I739 Peripheral vascular disease, unspecified: Secondary | ICD-10-CM | POA: Diagnosis not present

## 2023-09-04 DIAGNOSIS — E441 Mild protein-calorie malnutrition: Secondary | ICD-10-CM | POA: Diagnosis not present

## 2023-09-04 DIAGNOSIS — R451 Restlessness and agitation: Secondary | ICD-10-CM | POA: Diagnosis not present

## 2023-09-04 DIAGNOSIS — R410 Disorientation, unspecified: Secondary | ICD-10-CM | POA: Diagnosis not present

## 2023-09-04 DIAGNOSIS — F10139 Alcohol abuse with withdrawal, unspecified: Secondary | ICD-10-CM | POA: Diagnosis not present

## 2023-09-04 DIAGNOSIS — R011 Cardiac murmur, unspecified: Secondary | ICD-10-CM | POA: Diagnosis not present

## 2023-09-04 DIAGNOSIS — I771 Stricture of artery: Secondary | ICD-10-CM | POA: Diagnosis not present

## 2023-09-04 DIAGNOSIS — R569 Unspecified convulsions: Secondary | ICD-10-CM | POA: Diagnosis not present

## 2023-09-04 DIAGNOSIS — S72012A Unspecified intracapsular fracture of left femur, initial encounter for closed fracture: Secondary | ICD-10-CM | POA: Diagnosis not present

## 2023-09-04 DIAGNOSIS — S72092D Other fracture of head and neck of left femur, subsequent encounter for closed fracture with routine healing: Secondary | ICD-10-CM | POA: Diagnosis not present

## 2023-09-04 DIAGNOSIS — Y92009 Unspecified place in unspecified non-institutional (private) residence as the place of occurrence of the external cause: Secondary | ICD-10-CM

## 2023-09-04 DIAGNOSIS — F1011 Alcohol abuse, in remission: Secondary | ICD-10-CM

## 2023-09-04 DIAGNOSIS — I6389 Other cerebral infarction: Secondary | ICD-10-CM | POA: Diagnosis not present

## 2023-09-04 DIAGNOSIS — Z743 Need for continuous supervision: Secondary | ICD-10-CM | POA: Diagnosis not present

## 2023-09-04 DIAGNOSIS — M6281 Muscle weakness (generalized): Secondary | ICD-10-CM | POA: Diagnosis not present

## 2023-09-04 DIAGNOSIS — Z8679 Personal history of other diseases of the circulatory system: Secondary | ICD-10-CM | POA: Diagnosis not present

## 2023-09-04 DIAGNOSIS — Z96642 Presence of left artificial hip joint: Secondary | ICD-10-CM | POA: Diagnosis not present

## 2023-09-04 DIAGNOSIS — S72001A Fracture of unspecified part of neck of right femur, initial encounter for closed fracture: Secondary | ICD-10-CM | POA: Diagnosis not present

## 2023-09-04 DIAGNOSIS — R2681 Unsteadiness on feet: Secondary | ICD-10-CM | POA: Diagnosis not present

## 2023-09-04 DIAGNOSIS — R509 Fever, unspecified: Secondary | ICD-10-CM | POA: Diagnosis present

## 2023-09-04 DIAGNOSIS — S0990XA Unspecified injury of head, initial encounter: Secondary | ICD-10-CM | POA: Diagnosis not present

## 2023-09-04 DIAGNOSIS — G500 Trigeminal neuralgia: Secondary | ICD-10-CM | POA: Diagnosis not present

## 2023-09-04 DIAGNOSIS — R2689 Other abnormalities of gait and mobility: Secondary | ICD-10-CM | POA: Diagnosis not present

## 2023-09-04 DIAGNOSIS — I700 Atherosclerosis of aorta: Secondary | ICD-10-CM | POA: Diagnosis not present

## 2023-09-04 DIAGNOSIS — S299XXA Unspecified injury of thorax, initial encounter: Secondary | ICD-10-CM | POA: Diagnosis not present

## 2023-09-04 DIAGNOSIS — R0689 Other abnormalities of breathing: Secondary | ICD-10-CM | POA: Diagnosis not present

## 2023-09-04 DIAGNOSIS — E785 Hyperlipidemia, unspecified: Secondary | ICD-10-CM | POA: Diagnosis not present

## 2023-09-04 DIAGNOSIS — R5381 Other malaise: Secondary | ICD-10-CM | POA: Diagnosis not present

## 2023-09-04 DIAGNOSIS — I69391 Dysphagia following cerebral infarction: Secondary | ICD-10-CM | POA: Diagnosis not present

## 2023-09-04 LAB — URINALYSIS, ROUTINE W REFLEX MICROSCOPIC
Bacteria, UA: NONE SEEN
Bilirubin Urine: NEGATIVE
Glucose, UA: NEGATIVE mg/dL
Ketones, ur: NEGATIVE mg/dL
Leukocytes,Ua: NEGATIVE
Nitrite: NEGATIVE
Protein, ur: 100 mg/dL — AB
Specific Gravity, Urine: 1.039 — ABNORMAL HIGH (ref 1.005–1.030)
pH: 6 (ref 5.0–8.0)

## 2023-09-04 LAB — COMPREHENSIVE METABOLIC PANEL WITH GFR
ALT: 26 U/L (ref 0–44)
AST: 46 U/L — ABNORMAL HIGH (ref 15–41)
Albumin: 3.8 g/dL (ref 3.5–5.0)
Alkaline Phosphatase: 62 U/L (ref 38–126)
Anion gap: 13 (ref 5–15)
BUN: 16 mg/dL (ref 8–23)
CO2: 24 mmol/L (ref 22–32)
Calcium: 8.9 mg/dL (ref 8.9–10.3)
Chloride: 101 mmol/L (ref 98–111)
Creatinine, Ser: 0.84 mg/dL (ref 0.61–1.24)
GFR, Estimated: >60 mL/min (ref >60)
Glucose, Bld: 127 mg/dL — ABNORMAL HIGH (ref 70–99)
Potassium: 3.2 mmol/L — ABNORMAL LOW (ref 3.5–5.1)
Sodium: 138 mmol/L (ref 135–145)
Total Bilirubin: 2.3 mg/dL — ABNORMAL HIGH (ref 0.0–1.2)
Total Protein: 6.6 g/dL (ref 6.5–8.1)

## 2023-09-04 LAB — CBC
HCT: 41.3 % (ref 39.0–52.0)
Hemoglobin: 14.2 g/dL (ref 13.0–17.0)
MCH: 30.3 pg (ref 26.0–34.0)
MCHC: 34.4 g/dL (ref 30.0–36.0)
MCV: 88.1 fL (ref 80.0–100.0)
Platelets: 194 10*3/uL (ref 150–400)
RBC: 4.69 MIL/uL (ref 4.22–5.81)
RDW: 14.1 % (ref 11.5–15.5)
WBC: 15.4 10*3/uL — ABNORMAL HIGH (ref 4.0–10.5)
nRBC: 0 % (ref 0.0–0.2)

## 2023-09-04 LAB — I-STAT CHEM 8, ED
BUN: 18 mg/dL (ref 8–23)
Calcium, Ion: 1.06 mmol/L — ABNORMAL LOW (ref 1.15–1.40)
Chloride: 103 mmol/L (ref 98–111)
Creatinine, Ser: 0.7 mg/dL (ref 0.61–1.24)
Glucose, Bld: 125 mg/dL — ABNORMAL HIGH (ref 70–99)
HCT: 43 % (ref 39.0–52.0)
Hemoglobin: 14.6 g/dL (ref 13.0–17.0)
Potassium: 3.2 mmol/L — ABNORMAL LOW (ref 3.5–5.1)
Sodium: 141 mmol/L (ref 135–145)
TCO2: 27 mmol/L (ref 22–32)

## 2023-09-04 LAB — PROTIME-INR
INR: 1 (ref 0.8–1.2)
Prothrombin Time: 13.9 s (ref 11.4–15.2)

## 2023-09-04 LAB — TYPE AND SCREEN
ABO/RH(D): A NEG
Antibody Screen: NEGATIVE

## 2023-09-04 LAB — I-STAT CG4 LACTIC ACID, ED: Lactic Acid, Venous: 3 mmol/L (ref 0.5–1.9)

## 2023-09-04 LAB — SAMPLE TO BLOOD BANK

## 2023-09-04 LAB — TROPONIN I (HIGH SENSITIVITY): Troponin I (High Sensitivity): 55 ng/L — ABNORMAL HIGH (ref <18)

## 2023-09-04 LAB — MAGNESIUM: Magnesium: 2 mg/dL (ref 1.7–2.4)

## 2023-09-04 LAB — CK: Total CK: 780 U/L — ABNORMAL HIGH (ref 49–397)

## 2023-09-04 LAB — ETHANOL: Alcohol, Ethyl (B): <15 mg/dL (ref <15)

## 2023-09-04 MED ORDER — LACTATED RINGERS IV BOLUS
1000.0000 mL | Freq: Once | INTRAVENOUS | Status: AC
  Administered 2023-09-04: 1000 mL via INTRAVENOUS

## 2023-09-04 MED ORDER — FOLIC ACID 1 MG PO TABS
1.0000 mg | ORAL_TABLET | Freq: Once | ORAL | Status: AC
  Administered 2023-09-04: 1 mg via ORAL
  Filled 2023-09-04: qty 1

## 2023-09-04 MED ORDER — METHOCARBAMOL 500 MG PO TABS
500.0000 mg | ORAL_TABLET | Freq: Four times a day (QID) | ORAL | Status: DC | PRN
  Filled 2023-09-04: qty 1

## 2023-09-04 MED ORDER — BISACODYL 5 MG PO TBEC: 5.0000 mg | DELAYED_RELEASE_TABLET | Freq: Every day | ORAL | Status: DC | PRN

## 2023-09-04 MED ORDER — THIAMINE HCL 100 MG/ML IJ SOLN
100.0000 mg | Freq: Once | INTRAMUSCULAR | Status: AC
  Administered 2023-09-04: 100 mg via INTRAVENOUS
  Filled 2023-09-04: qty 2

## 2023-09-04 MED ORDER — PROSIGHT PO TABS
1.0000 | ORAL_TABLET | Freq: Two times a day (BID) | ORAL | Status: DC
  Administered 2023-09-05 – 2023-09-07 (×5): 1 via ORAL
  Filled 2023-09-04 (×7): qty 1

## 2023-09-04 MED ORDER — MORPHINE SULFATE (PF) 2 MG/ML IV SOLN
2.0000 mg | INTRAVENOUS | Status: DC | PRN
  Administered 2023-09-05 (×3): 2 mg via INTRAVENOUS
  Filled 2023-09-04 (×3): qty 1

## 2023-09-04 MED ORDER — POTASSIUM CHLORIDE CRYS ER 20 MEQ PO TBCR
40.0000 meq | EXTENDED_RELEASE_TABLET | Freq: Once | ORAL | Status: AC
  Administered 2023-09-04: 40 meq via ORAL
  Filled 2023-09-04: qty 2

## 2023-09-04 MED ORDER — LORAZEPAM 1 MG PO TABS
1.0000 mg | ORAL_TABLET | ORAL | Status: AC | PRN
  Administered 2023-09-05: 2 mg via ORAL
  Administered 2023-09-06: 1 mg via ORAL
  Filled 2023-09-04: qty 1
  Filled 2023-09-04: qty 2

## 2023-09-04 MED ORDER — PHENOBARBITAL SODIUM 65 MG/ML IJ SOLN
65.0000 mg | Freq: Once | INTRAMUSCULAR | Status: AC
  Administered 2023-09-04: 65 mg via INTRAVENOUS
  Filled 2023-09-04: qty 1

## 2023-09-04 MED ORDER — IOHEXOL 350 MG/ML SOLN
75.0000 mL | Freq: Once | INTRAVENOUS | Status: AC | PRN
  Administered 2023-09-04: 75 mL via INTRAVENOUS

## 2023-09-04 MED ORDER — HYDROCODONE-ACETAMINOPHEN 5-325 MG PO TABS
1.0000 | ORAL_TABLET | Freq: Four times a day (QID) | ORAL | Status: DC | PRN
  Administered 2023-09-05 (×2): 2 via ORAL
  Filled 2023-09-04 (×2): qty 2

## 2023-09-04 MED ORDER — ADULT MULTIVITAMIN W/MINERALS CH
1.0000 | ORAL_TABLET | Freq: Every day | ORAL | Status: DC
  Administered 2023-09-04 – 2023-09-10 (×6): 1 via ORAL
  Filled 2023-09-04 (×7): qty 1

## 2023-09-04 MED ORDER — IRBESARTAN 150 MG PO TABS
150.0000 mg | ORAL_TABLET | Freq: Every day | ORAL | Status: DC
  Administered 2023-09-04 – 2023-09-10 (×7): 150 mg via ORAL
  Filled 2023-09-04 (×7): qty 1

## 2023-09-04 MED ORDER — POLYETHYLENE GLYCOL 3350 17 G PO PACK: 17.0000 g | PACK | Freq: Every day | ORAL | Status: DC | PRN

## 2023-09-04 MED ORDER — METHOCARBAMOL 1000 MG/10ML IJ SOLN: 500.0000 mg | Freq: Four times a day (QID) | INTRAMUSCULAR | Status: DC | PRN

## 2023-09-04 MED ORDER — METOPROLOL TARTRATE 50 MG PO TABS
50.0000 mg | ORAL_TABLET | Freq: Two times a day (BID) | ORAL | Status: DC
  Administered 2023-09-04 – 2023-09-10 (×12): 50 mg via ORAL
  Filled 2023-09-04 (×5): qty 1
  Filled 2023-09-04: qty 2
  Filled 2023-09-04 (×6): qty 1

## 2023-09-04 MED ORDER — LORAZEPAM 2 MG/ML IJ SOLN
1.0000 mg | INTRAMUSCULAR | Status: AC | PRN
  Administered 2023-09-04 – 2023-09-06 (×4): 2 mg via INTRAVENOUS
  Filled 2023-09-04 (×4): qty 1

## 2023-09-04 MED ORDER — HYDRALAZINE HCL 20 MG/ML IJ SOLN: 10.0000 mg | Freq: Four times a day (QID) | INTRAMUSCULAR | Status: DC | PRN

## 2023-09-04 NOTE — H&P (Signed)
 On re-checking on the patient patient reported right angle pain and not knowing what exactly happened. He did drink, but did not report heavy drinking and I was concerned about possible syncope. He has followed very closely by cardiology and has had a recent echo and cardiology visit, but I felt it necessary to rule out TIA so I've ordered an MRI of the brain along with carotid dopers. Also note that for his abnormal EKG, which is different from his June KG patient did not have any chest pains or shortness of breath or dyspnea  exertion and proponents were 50 and previously was 80.

## 2023-09-04 NOTE — ED Provider Notes (Signed)
 I took over care of this patient at 4:30 pm pending remainder of imaging and labs.  On my evaluation he is feeling somewhat improved but still having some leg pain.  C-collar was removed after CT C-spine was negative.  Physical Exam  BP (!) 162/94   Pulse 88   Temp 99.7 F (37.6 C)   Resp (!) 25   Ht 6' 1 (1.854 m)   Wt 73 kg   SpO2 99%   BMI 21.23 kg/m   Physical Exam Vitals and nursing note reviewed.  Constitutional:      Appearance: He is ill-appearing.     Comments: Chronically ill and frail appearing   HENT:     Head: Normocephalic.     Nose: Nose normal.  Cardiovascular:     Rate and Rhythm: Normal rate.     Pulses: Normal pulses.  Pulmonary:     Effort: Pulmonary effort is normal.  Abdominal:     General: Abdomen is flat.     Tenderness: There is no abdominal tenderness.  Musculoskeletal:     Comments: Left hip pain, limited ROM   Neurological:     General: No focal deficit present.     Mental Status: He is alert.  Psychiatric:        Mood and Affect: Mood normal.     Procedures  Procedures  ED Course / MDM    Medical Decision Making Medical Decision Making Nursing notes are reviewed. Differential diagnosis for this patient would include but not limited to: Dehydration, fracture, sprain, contusion, intracranial hemorrhage, alcohol abuse, withdrawal, other  Records reviewed: Prior records reviewed and patient seen at outpatient urgent care office at 5 - 3 - 25 for an ankle injury  Consults: ortho will plan to see the patient in the a.m.  Studies: Imaging studies were reviewed and CT head and CT C-spine showed no acute abnormality.  CT chest abdomen pelvis showed no acute abnormality except for known left hip fracture.  EKG interpretation: Interpreted by me in the absence of cardiology shows sinus rhythm, no STEMI, no acute change when compared to prior EKG  Cardiac monitor interpretation: sinus Rhythm, no ectopy  Emergency Department Course:  Vital  signs and pulse oximetry are reviewed, evaluated by myself and found to be within normal limits prior to final disposition. Findings of laboratory testing and medical imaging are discussed with patient and family that is available. Patient agrees with the medical care plan as follows:  Patient was treated previously for possible withdrawal of alcohol with alcohol is negative.  He has a history of abuse in the past.  Was given thiamine  folic acid  and multiple liters of IV fluids for elevated lactate and treated for dehydration as well.  He was found to have a left hip fracture and called before we had consulted Ortho and they will plan to operate tomorrow.  Patient's case discussed with hospitalist and patient will be admitted for further workup and management.  I did add on a CPK and magnesium  which are pending at this time.  No family bedside are agreeable with the plan.  All questions answered.  Problems Addressed: Closed fracture of right hip, initial encounter [facility]): acute illness or injury that poses a threat to life or bodily functions Elevated lactic acid level: undiagnosed new problem with uncertain prognosis Fall, initial encounter: acute illness or injury History of alcohol abuse: chronic illness or injury Hypokalemia: acute illness or injury  Amount and/or Complexity of Data Reviewed Independent Historian: spouse  Details: At bedside, helped to provide history of events and timeline  External Data Reviewed: notes. Labs: ordered. Decision-making details documented in ED Course. Radiology: ordered and independent interpretation performed. Decision-making details documented in ED Course. ECG/medicine tests: ordered and independent interpretation performed. Decision-making details documented in ED Course.  Risk Prescription drug management. Drug therapy requiring intensive monitoring for toxicity. Decision regarding hospitalization. Diagnosis or treatment significantly limited by  social determinants of health.   Duwaine Fusi, DO        Raffaela Ladley L, DO 09/04/23 2236

## 2023-09-04 NOTE — ED Notes (Addendum)
 Transport notified patient being transferred to 5N room 9. Floor notified and are ready to receive patient.

## 2023-09-04 NOTE — ED Provider Notes (Addendum)
 [place] EMERGENCY DEPARTMENT AT [facility] Provider Note   CSN: [id number] Arrival date & time: 09/04/23  1456     Patient presents with: James Ibarra is a 73 y.o. male.   This is a 73 year old male here today after a fall that occurred yesterday.  Reportedly, patient's wife was out of town, when she returned home today, he was on the ground.  She spoke with him at 130 yesterday.  Patient has a history of alcohol use, reportedly been trying to cut back but wife is concerned that the patient may have been sneaking alcoholic beverages.  EMS found empty alcohol bottles on scene.  Patient was previously on Eliquis , however was discontinued when he had his ablation.  He has not been on Eliquis  for 1 week now.   Fall       Prior to Admission medications   Medication Sig Start Date End Date Taking? Authorizing Provider  acetaminophen  (TYLENOL ) 325 MG tablet Take 2 tablets (650 mg total) by mouth every 6 (six) hours. 12/14/22   Roddenberry, Myron G, PA-C  amoxicillin (AMOXIL) 500 MG capsule SMARTSIG:4 Capsule(s) By Mouth 06/18/23   [provider]  Ascorbic Acid (VITAMIN C PO) Take 750 mg by mouth daily.    [provider]  Cholecalciferol (VITAMIN D) 50 MCG (2000 UT) tablet Take 2,000 Units by mouth daily.    [provider]  fluorouracil (EFUDEX) 5 % cream Apply 1 Application topically 2 (two) times daily. 08/24/23   [provider]  folic acid  (FOLVITE ) 1 MG tablet Take 1 mg by mouth daily.    [provider]  gabapentin  (NEURONTIN ) 300 MG capsule TAKE 2 CAPSULES BY MOUTH 4 TIMES DAILY 10/10/22   Onita Duos, MD  hydrocortisone 2.5 % ointment Apply 1 Application topically 2 (two) times daily as needed (itching).    [provider]  ketoconazole (NIZORAL) 2 % cream Apply 1 Application topically as needed for irritation. 01/16/13   [provider]  Magnesium  400 MG TABS Take 400 mg by mouth daily.    [provider]  melatonin 3 MG TABS tablet Take 3 mg by mouth at bedtime as needed (sleep).    [provider]  metoprolol  tartrate (LOPRESSOR ) 50 MG tablet Take 1 tablet (50 mg total) by mouth 2 (two) times daily. 05/14/23   Wyn Jackee VEAR Mickey., NP  Multiple Vitamins-Minerals (PRESERVISION AREDS 2) CAPS Take 1 capsule by mouth 2 (two) times daily.    [provider]  Polyethyl Glycol-Propyl Glycol (SYSTANE OP) Place 1 drop into both eyes daily as needed (dry eyes).    [provider]  potassium chloride  (KLOR-CON ) 10 MEQ tablet Take 1 tablet (10 mEq total) by mouth daily. 05/23/23   Mealor, Augustus E, MD  rosuvastatin  (CRESTOR ) 20 MG tablet Take 1 tablet (20 mg total) by mouth daily. 08/30/23   Thukkani, Arun K, MD  sildenafil (REVATIO) 20 MG tablet Take 40-60 mg by mouth daily as needed (ED).    [provider]  telmisartan  (MICARDIS ) 40 MG tablet Take 1 tablet (40 mg total) by mouth daily. 08/30/23   Thukkani, Arun K, MD  thiamine  (VITAMIN B-1) 100 MG tablet Take 100 mg by mouth daily.    [provider]  traZODone  (DESYREL ) 100 MG tablet Take 100-150 mg by mouth at bedtime.    [provider]    Allergies: Patient has no known allergies.    Review of Systems  Updated  Vital Signs BP (!) 154/102 (BP Location: Right Arm)   Pulse 90   Temp 99.1 F (37.3 C) (Oral)   Resp 20   Ht 6' 1 (1.854 m)   Wt 73 kg   SpO2 99%   BMI 21.23 kg/m   Physical Exam Vitals reviewed.  Constitutional:      Appearance: He is not toxic-appearing.  HENT:     Head: Normocephalic and atraumatic.     Nose: Nose normal.   Eyes:     Pupils: Pupils are equal, round, and reactive to light.   Neck:     Comments: Cervical collar in place Cardiovascular:     Rate and Rhythm: Normal rate.  Pulmonary:     Effort: Pulmonary effort is normal.  Abdominal:     General: Abdomen is flat. There is no distension.     Palpations: Abdomen is soft.     Comments:  Bruising over the right upper abdomen and right chest wall   Musculoskeletal:     Comments: Left hip, shortened, internally rotated.  Good pulses, patient able to wiggle toes.   Neurological:     Mental Status: He is alert. Mental status is at baseline.     (all labs ordered are listed, but only abnormal results are displayed) Labs Reviewed  CBC - Abnormal; Notable for the following components:      Result Value   WBC 15.4 (*)    All other components within normal limits  I-STAT CHEM 8, ED - Abnormal; Notable for the following components:   Potassium 3.2 (*)    Glucose, Bld 125 (*)    Calcium , Ion 1.06 (*)    All other components within normal limits  I-STAT CG4 LACTIC ACID, ED - Abnormal; Notable for the following components:   Lactic Acid, Venous 3.0 (*)    All other components within normal limits  COMPREHENSIVE METABOLIC PANEL WITH GFR  ETHANOL  URINALYSIS, ROUTINE W REFLEX MICROSCOPIC  PROTIME-INR  SAMPLE TO BLOOD BANK    EKG: None  Radiology: DG Pelvis Portable Result Date: 09/04/2023 CLINICAL DATA:  Trauma fall EXAM: PORTABLE PELVIS 1-2 VIEWS COMPARISON:  None Available. FINDINGS: SI joints are non widened. Pubic symphysis and rami appear intact. Acute displaced left femoral neck fracture. No femoral head dislocation IMPRESSION: Acute displaced left femoral neck fracture. Electronically Signed   By: Luke Bun M.D.   On: 09/04/2023 15:58   DG Chest Port 1 View Result Date: 09/04/2023 CLINICAL DATA:  Trauma fall EXAM: PORTABLE CHEST 1 VIEW COMPARISON:  12/26/2022 FINDINGS: Post sternotomy changes with valve prosthesis and atrial appendage clip. Hyperinflated lungs. No focal opacity, pleural effusion, or pneumothorax. IMPRESSION: No active disease. Hyperinflated lungs. Electronically Signed   By: Luke Bun M.D.   On: 09/04/2023 15:57     .Critical Care  Performed by: Mannie Fairy DASEN, DO Authorized by: Mannie Fairy DASEN, DO   Critical care provider statement:     Critical care time (minutes):  33   Critical care was necessary to treat or prevent imminent or life-threatening deterioration of the following conditions:  Trauma   Critical care was time spent personally by me on the following activities:  Development of treatment plan with patient or surrogate, discussions with consultants, evaluation of patient's response to treatment, examination of patient, ordering and review of laboratory studies, ordering and review of radiographic studies, ordering and performing treatments and interventions, pulse oximetry, re-evaluation of patient's condition and review of old charts    Medications Ordered  in the ED  lactated ringers  bolus 1,000 mL (has no administration in time range)  PHENObarbital  (LUMINAL) injection 65 mg (65 mg Intravenous Given 09/04/23 1549)  thiamine  (VITAMIN B1) injection 100 mg (100 mg Intravenous Given 09/04/23 1547)  folic acid  (FOLVITE ) tablet 1 mg (1 mg Oral Given 09/04/23 1550)  lactated ringers  bolus 1,000 mL (1,000 mLs Intravenous New Bag/Given 09/04/23 1553)                                    Medical Decision Making 73 year old male here today for a fall yesterday, on the ground for between 12 and 24 hours.  Plan-patient with a clear hip fracture on exam.  Plain films ordered.  Regarding the patient's traumatic injury, recently anticoagulated, history of alcohol abuse, will obtain CT imaging of the patient's head, neck, chest abdomen and pelvis.  He has normal vital signs.  Patient likely with elevated CK.  Will start the patient on some IV fluids here.  Will check renal function.  Expect there to be some metabolic derangement.  Regarding the patient's alcohol use, my concern is that he likely has been increasing his alcohol use.  Does seem a little bit confused and tremulous to me.  I am going to start the patient with some IV phenobarbital .  Reviewed the patient's most recent liver function and her labs.  Patient will be signed  out to Dr. Gennaro pending CT imaging, labs and admission.  Spoke with Dr. Patria from ortho. They will post the patient pending the remainder of his traumatic/medical workup.  Amount and/or Complexity of Data Reviewed Labs: ordered. Radiology: ordered.  Risk Prescription drug management. Decision regarding hospitalization.        Final diagnoses:  Fall, initial encounter  Closed fracture of right hip, initial encounter [facility])    ED Discharge Orders     None          Mannie Pac T, DO 09/04/23 1621    Mannie Pac T, DO 09/04/23 1635

## 2023-09-04 NOTE — Progress Notes (Signed)
 Received consult from Dr. Mannie.  Reviewed imaging which shows left femoral neck fracture.  Admit to medicine service.  NPO after midnight.  Hold anticoagulation.  Reportedly has been off of eliquis  for a week since recent afib ablation.  Tentative plan for surgery tomorrow pending medical optimization.  James Ozell Cummins, MD [facility] 4:37 PM

## 2023-09-04 NOTE — H&P (Addendum)
 History and Physical    Patient: James Ibarra FMW:[id number] DOB: [birth year] DOA: 09/04/2023 DOS: the patient was seen and examined on 09/04/2023 . PCP: Kip Righter, MD  Patient coming from: Home Chief complaint: Chief Complaint  Patient presents with   Fall   HPI:  James Ibarra is a 73 y.o. 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  Patient was found on the ground at 130 today.  With no reports of loss of consciousness and denies any trauma to his head or striking his head.  Patient does have left hip shortening and rotation.  Noted to have alcohol bottles around him. Wife suspects he may have started drinking again. Pt states he did drink and needs help to stop drinking.   ED Course : Vital signs in the ED were notable for the following:  Vitals:   09/04/23 1515 09/04/23 1516 09/04/23 1841 09/04/23 1929  BP: (!) 154/102  (!) 164/109 (!) 187/115  Pulse: 90  91 93  Temp: 99.1 F (37.3 C)  99.7 F (37.6 C)   Resp: 20  20   Height:  6' 1 (1.854 m)    Weight:  73 kg    SpO2: 99%  98%   TempSrc: Oral     BMI (Calculated):  21.24    >>ED evaluation thus far shows: Cmp showing: potasium of 3.2 and glucose 127  ast 46 and TB at 2.3. Lactic acid: 3.0. Cbc shows wbc of 15.4 CPK ordered and pending troponin ordered and pending. Head CT and CT spine negative for any acute findings. CT abdomen and pelvis shows a comminuted subcapital left femoral neck fracture gallbladder is unremarkable pancreas is unremarkable spleen stomach unremarkable.  >>While in the ED patient received the following: Medications  lactated ringers  bolus 1,000 mL (has no administration in time range)  PHENObarbital  (LUMINAL) injection 65 mg (65 mg Intravenous Given 09/04/23 1549)  thiamine  (VITAMIN B1) injection 100 mg (100 mg  Intravenous Given 09/04/23 1547)  folic acid  (FOLVITE ) tablet 1 mg (1 mg OralGiven 09/04/23 1550)  lactated ringers  bolus 1,000 mL (1,000 mLs Intravenous New Bag/Given 09/04/23 1553)  iohexol  (OMNIPAQUE ) 350 MG/ML injection 75 mL (75 mLs Intravenous Contrast Given 09/04/23 1639)   Review of Systems  Musculoskeletal:  Positive for falls and joint pain.  All other systems reviewed and are negative.  Past Medical History:  Diagnosis Date   Atrial fibrillation (HCC)    Cancer (HCC)    Skin Cancer - Scalp, Right neck, Left Hand   Dysrhythmia    A. Fib while hospitalized June 2024   Heart murmur    History of nonmelanoma skin cancer    Hypercholesteremia    Hypertension    Pneumonia 08/2022   Trigeminal neuralgia    Past Surgical History:  Procedure Laterality Date   ATRIAL FIBRILLATION ABLATION N/A 05/24/2023   Procedure: ATRIAL FIBRILLATION ABLATION;  Surgeon: Nancey Eulas BRAVO, MD;  Location: [facility];  Service: Cardiovascular;  Laterality: N/A;   CATARACT EXTRACTION W/ INTRAOCULAR LENS IMPLANT Bilateral    CLIPPING OF ATRIAL APPENDAGE Left 12/08/2022   Procedure: CLIPPING OF ATRIAL APPENDAGE (LAA) USING 50 MM ATRICLIP;  Surgeon: Maryjane Mt, MD;  Location: [facility] OR;  Service: Open Heart Surgery;  Laterality: Left;   INGUINAL HERNIA REPAIR  11/07/2011   Procedure:  LAPAROSCOPIC INGUINAL HERNIA;  Surgeon: Camellia CHRISTELLA Blush, MD,FACS;  Location: [facility];  Service: General;  Laterality: Left;   MAZE N/A 12/08/2022   Procedure: MAZE;  Surgeon: Maryjane Mt, MD;  [facility];  Service: Open Heart Surgery;  Laterality: N/A;   MITRAL VALVE REPAIR N/A 12/08/2022   Procedure: MITRAL VALVE REPAIR (MVR) USING 34 MM SIMULUS SEMI- RIGID ANNULOPLASTY BAND;  Surgeon: Maryjane Mt, MD;  Location: [facility] OR;  Service: Open Heart Surgery;  Laterality: N/A;   RETINAL DETACHMENT SURGERY  03/07/1995   RIGHT/LEFT HEART CATH AND CORONARY ANGIOGRAPHY N/A 10/12/2022   Procedure: RIGHT/LEFT HEART CATH AND CORONARY  ANGIOGRAPHY;  Surgeon: Wendel Lurena POUR, MD;  Location: [facility];  Service: Cardiovascular;  Laterality: N/A;   SKIN CANCER EXCISION  2012, 1999   basal cell - neck and hand   TEE WITHOUT CARDIOVERSION N/A 10/16/2022   Procedure: TRANSESOPHAGEAL ECHOCARDIOGRAM;  Surgeon: Santo Stanly LABOR, MD;  Location: [facility];  Service: Cardiovascular;  Laterality: N/A;   TEE WITHOUT CARDIOVERSION N/A 12/08/2022   Procedure: TRANSESOPHAGEAL ECHOCARDIOGRAM;  Surgeon: Maryjane Mt, MD;  Location: [facility] OR;  Service: Open Heart Surgery;  Laterality: N/A;    reports that he has never smoked. He has never used smokeless tobacco. He reports that he does not currently use alcohol. He reports that he does not use drugs. No Known Allergies Family History  Problem Relation Age of Onset   Heart disease Father    Heart disease Mother    Prior to Admission medications   Medication Sig Start Date End Date Taking? Authorizing Provider  acetaminophen  (TYLENOL ) 325 MG tablet Take 2 tablets (650 mg total) by mouth every 6 (six) hours. 12/14/22   Roddenberry, Myron G, PA-C  amoxicillin (AMOXIL) 500 MG capsule SMARTSIG:4 Capsule(s) By Mouth 06/18/23   [provider]  Ascorbic Acid (VITAMIN C PO) Take 750 mg by mouth daily.    [provider]  Cholecalciferol (VITAMIN D) 50 MCG (2000 UT) tablet Take 2,000 Units by mouth daily.    [provider]  fluorouracil (EFUDEX) 5 % cream Apply 1 Application topically 2 (two) times daily. 08/24/23   [provider]  folic acid  (FOLVITE ) 1 MG tablet Take 1 mg by mouth daily.    [provider]  gabapentin  (NEURONTIN ) 300 MG capsule TAKE 2 CAPSULES BY MOUTH 4 TIMES DAILY 10/10/22   Onita Duos, MD  hydrocortisone 2.5 % ointment Apply 1 Application topically 2 (two) times daily as needed (itching).    [provider]  ketoconazole (NIZORAL) 2 % cream Apply 1 Application topically as needed for irritation. 01/16/13    [provider]  Magnesium  400 MG TABS Take 400 mg by mouth daily.    [provider]  melatonin 3 MG TABS tablet Take 3 mg by mouth at bedtime as needed (sleep).    [provider]  metoprolol  tartrate (LOPRESSOR ) 50 MG tablet Take 1 tablet (50 mg total) by mouth 2 (two) times daily. 05/14/23   Wyn Jackee VEAR Mickey., NP  Multiple Vitamins-Minerals (PRESERVISION AREDS 2) CAPS Take 1 capsule by mouth 2 (two) times daily.    [provider]  Polyethyl Glycol-Propyl Glycol (SYSTANE OP) Place 1 drop into both eyes daily as needed (dry eyes).    [provider]  potassium chloride  (KLOR-CON ) 10 MEQ tablet Take 1 tablet (10 mEq total) by mouth daily. 05/23/23   Mealor, Augustus E, MD  rosuvastatin  (CRESTOR ) 20 MG tablet Take 1  tablet (20 mg total) by mouth daily. 08/30/23   Thukkani, Arun K, MD  sildenafil (REVATIO) 20 MG tablet Take 40-60 mg by mouth daily as needed (ED).    [provider]  telmisartan  (MICARDIS ) 40 MG tablet Take 1 tablet (40 mg total) by mouth daily. 08/30/23   Thukkani, Arun K, MD  thiamine  (VITAMIN B-1) 100 MG tablet Take 100 mg by mouth daily.    [provider]  traZODone  (DESYREL ) 100 MG tablet Take 100-150 mg by mouth at bedtime.    [provider]                                                                                 Vitals:   09/04/23 1515 09/04/23 1516 09/04/23 1841 09/04/23 1929  BP: (!) 154/102  (!) 164/109 (!) 187/115  Pulse: 90  91 93  Resp: 20  20   Temp: 99.1 F (37.3 C)  99.7 F (37.6 C)   TempSrc: Oral     SpO2: 99%  98%   Weight:  73 kg    Height:  6' 1 (1.854 m)     Physical Exam Vitals reviewed.  Constitutional:      General: He is not in acute distress.    Appearance: He is not ill-appearing.  HENT:     Head: Normocephalic.   Eyes:     Extraocular Movements: Extraocular movements intact.    Cardiovascular:     Rate and Rhythm: Normal rate and regular rhythm.      Heart sounds: Normal heart sounds.  Pulmonary:     Breath sounds: Normal breath sounds.  Abdominal:     General: There is no distension.     Palpations: Abdomen is soft.     Tenderness: There is no abdominal tenderness.   Musculoskeletal:        General: Tenderness present.   Neurological:     General: No focal deficit present.     Mental Status: He is alert and oriented to person, place, and time.     Labs on Admission: I have personally reviewed following labs and imaging studies CBC: Recent Labs  Lab 09/04/23 1550 09/04/23 1604  WBC 15.4*  --   HGB 14.2 14.6  HCT 41.3 43.0  MCV 88.1  --   PLT 194  --    Basic Metabolic Panel: Recent Labs  Lab 09/04/23 1550 09/04/23 1604  NA 138 141  K 3.2* 3.2*  CL 101 103  CO2 24  --   GLUCOSE 127* 125*  BUN 16 18  CREATININE 0.84 0.70  CALCIUM  8.9  --   MG 2.0  --    GFR: Estimated Creatinine Clearance: 86.2 mL/min (by C-G formula based on SCr of 0.7 mg/dL). Liver Function Tests: Recent Labs  Lab 09/04/23 1550  AST 46*  ALT 26  ALKPHOS 62  BILITOT 2.3*  PROT 6.6  ALBUMIN  3.8   No results for input(s): LIPASE, AMYLASE in the last 168 hours. No results for input(s): AMMONIA in the last 168 hours. Coagulation Profile: Recent Labs  Lab 09/04/23 1550  INR 1.0   Cardiac Enzymes: Recent Labs  Lab 09/04/23 1550  CKTOTAL 780*  BNP (last 3 results) No results for input(s): PROBNP in the last 8760 hours. HbA1C: No results for input(s): HGBA1C in the last 72 hours. CBG: No results for input(s): GLUCAP in the last 168 hours. Lipid Profile: No results for input(s): CHOL, HDL, LDLCALC, TRIG, CHOLHDL, LDLDIRECT in the last 72 hours. Thyroid  Function Tests: No results for input(s): TSH, T4TOTAL, FREET4, T3FREE, THYROIDAB in the last 72 hours. Anemia Panel: No results for input(s): VITAMINB12, FOLATE, FERRITIN, TIBC, IRON , RETICCTPCT in the last 72 hours. Urine  analysis:    Component Value Date/Time   COLORURINE YELLOW 12/06/2022 1237   APPEARANCEUR CLEAR 12/06/2022 1237   LABSPEC 1.017 12/06/2022 1237   PHURINE 6.0 12/06/2022 1237   GLUCOSEU NEGATIVE 12/06/2022 1237   HGBUR NEGATIVE 12/06/2022 1237   BILIRUBINUR NEGATIVE 12/06/2022 1237   KETONESUR NEGATIVE 12/06/2022 1237   PROTEINUR NEGATIVE 12/06/2022 1237   NITRITE NEGATIVE 12/06/2022 1237   LEUKOCYTESUR NEGATIVE 12/06/2022 1237   Radiological Exams on Admission: CT HEAD WO CONTRAST Result Date: 09/04/2023 CLINICAL DATA:  Provided history: Head trauma, moderate/severe. Polytrauma, blunt. EXAM: CT HEAD WITHOUT CONTRAST CT CERVICAL SPINE WITHOUT CONTRAST TECHNIQUE: Multidetector CT imaging of the head and cervical spine was performed following the standard protocol without intravenous contrast. Multiplanar CT image reconstructions of the cervical spine were also generated. RADIATION DOSE REDUCTION: This exam was performed according to the departmental dose-optimization program which includes automated exposure control, adjustment of the mA and/or kV according to patient size and/or use of iterative reconstruction technique. COMPARISON:  None. FINDINGS: CT HEAD FINDINGS Brain: Generalized cerebral atrophy. Prominence of the ventricles and sulci, which appears commensurate. Patchy and ill-defined hypoattenuation within the cerebral white matter, nonspecific but compatible with mild chronic small vessel ischemic disease. Mega cisterna magna (anatomic variant). There is no acute intracranial hemorrhage. No demarcated cortical infarct. No extra-axial fluid collection. No evidence of an intracranial mass. No midline shift.  a Vascular: No hyperdense vessel.  Atherosclerotic calcifications. Skull: No calvarial fracture or aggressive osseous lesion. Sinuses/Orbits: No mass or acute finding within the imaged orbits. Left scleral buckle. Prior bilateral ocular lens replacement. No significant paranasal sinus  disease. CT CERVICAL SPINE FINDINGS Alignment: Dextrocurvature of the cervical spine. Levocurvature of the visible upper thoracic spine. 2 mm C7-T1 grade 1 anterolisthesis. Skull base and vertebrae: The basion-dental and atlanto-dental intervals are maintained.No evidence of acute fracture to the cervical spine. Soft tissues and spinal canal: No prevertebral fluid or swelling. No visible canal hematoma. Disc levels: Cervical spondylosis with multilevel disc space narrowing, disc bulges/central disc protrusions, endplate spurring and uncovertebral hypertrophy. Facet arthropathy on the left at C7-T1. No appreciable high-grade spinal canal stenosis. Multilevel bony neural foraminal narrowing, greatest on the left at C3-C4, C4-C5 and C5-C6. Degenerative changes also present at the C1-C2 articulation. Upper chest: No consolidation within the imaged lung apices. No visible pneumothorax. IMPRESSION: CT head: 1. No evidence of an acute intracranial abnormality. 2. Parenchymal atrophy and chronic small vessel ischemic disease. CT cervical spine: 1. No evidence of an acute cervical spine fracture. 2. 2 mm C7-T1 grade 1 anterolisthesis. 3. Dextrocurvature of the cervical spine. 4. Cervical spondylosis as described. Electronically Signed   By: Rockey Childs D.O.   On: 09/04/2023 17:01   CT CERVICAL SPINE WO CONTRAST Result Date: 09/04/2023 CLINICAL DATA:  Provided history: Head trauma, moderate/severe. Polytrauma, blunt. EXAM: CT HEAD WITHOUT CONTRAST CT CERVICAL SPINE WITHOUT CONTRAST TECHNIQUE: Multidetector CT imaging of the head and cervical spine was performed following the standard protocol without intravenous  contrast. Multiplanar CT image reconstructions of the cervical spine were also generated. RADIATION DOSE REDUCTION: This exam was performed according to the departmental dose-optimization program which includes automated exposure control, adjustment of the mA and/or kV according to patient size and/or use of  iterative reconstruction technique. COMPARISON:  None. FINDINGS: CT HEAD FINDINGS Brain: Generalized cerebral atrophy. Prominence of the ventricles and sulci, which appears commensurate. Patchy and ill-defined hypoattenuation within the cerebral white matter, nonspecific but compatible with mild chronic small vessel ischemic disease. Mega cisterna magna (anatomic variant). There is no acute intracranial hemorrhage. No demarcated cortical infarct. No extra-axial fluid collection. No evidence of an intracranial mass. No midline shift.  a Vascular: No hyperdense vessel.  Atherosclerotic calcifications. Skull: No calvarial fracture or aggressive osseous lesion. Sinuses/Orbits: No mass or acute finding within the imaged orbits. Left scleral buckle. Prior bilateral ocular lens replacement. No significant paranasal sinus disease. CT CERVICAL SPINE FINDINGS Alignment: Dextrocurvature of the cervical spine. Levocurvature of the visible upper thoracic spine. 2 mm C7-T1 grade 1 anterolisthesis. Skull base and vertebrae: The basion-dental and atlanto-dental intervals are maintained.No evidence of acute fracture to the cervical spine. Soft tissues and spinal canal: No prevertebral fluid or swelling. No visible canal hematoma. Disc levels: Cervical spondylosis with multilevel disc space narrowing, disc bulges/central disc protrusions, endplate spurring and uncovertebral hypertrophy. Facet arthropathy on the left at C7-T1. No appreciable high-grade spinal canal stenosis. Multilevel bony neural foraminal narrowing, greatest on the left at C3-C4, C4-C5 and C5-C6. Degenerative changes also present at the C1-C2 articulation. Upper chest: No consolidation within the imaged lung apices. No visible pneumothorax. IMPRESSION: CT head: 1. No evidence of an acute intracranial abnormality. 2. Parenchymal atrophy and chronic small vessel ischemic disease. CT cervical spine: 1. No evidence of an acute cervical spine fracture. 2. 2 mm C7-T1  grade 1 anterolisthesis. 3. Dextrocurvature of the cervical spine. 4. Cervical spondylosis as described. Electronically Signed   By: Rockey Childs D.O.   On: 09/04/2023 17:01   CT CHEST ABDOMEN PELVIS W CONTRAST Result Date: 09/04/2023 CLINICAL DATA:  Found down, fell, hip injury EXAM: CT CHEST, ABDOMEN, AND PELVIS WITH CONTRAST TECHNIQUE: Multidetector CT imaging of the chest, abdomen and pelvis was performed following the standard protocol during bolus administration of intravenous contrast. RADIATION DOSE REDUCTION: This exam was performed according to the departmental dose-optimization program which includes automated exposure control, adjustment of the mA and/or kV according to patient size and/or use of iterative reconstruction technique. CONTRAST:  75mL OMNIPAQUE  IOHEXOL  350 MG/ML SOLN COMPARISON:  09/04/2023, 05/03/2023 FINDINGS: CT CHEST FINDINGS Cardiovascular: Postsurgical changes from median sternotomy and mitral valve replacement. Surgical clip across the left atrial appendage. Heart is otherwise unremarkable without pericardial effusion. No evidence of vascular injury. No evidence of thoracic aortic aneurysm or dissection. Atherosclerosis of the aortic arch. Mediastinum/Nodes: No enlarged mediastinal, hilar, or axillary lymph nodes. Thyroid  gland, trachea, and esophagus demonstrate no significant findings. Lungs/Pleura: No acute airspace disease, effusion, or pneumothorax. Minimal hypoventilatory changes or subpleural scarring within the dependent lower lobes. Central airways are patent. Musculoskeletal: Prior median sternotomy. No acute or destructive bony abnormalities. Reconstructed images demonstrate no additional findings. CT ABDOMEN PELVIS FINDINGS Hepatobiliary: No hepatic injury or perihepatic hematoma. Gallbladder is unremarkable. Pancreas: Unremarkable. No pancreatic ductal dilatation or surrounding inflammatory changes. Spleen: No splenic injury or perisplenic hematoma. Adrenals/Urinary  Tract: No adrenal hemorrhage or renal injury identified. Bladder is unremarkable. Stomach/Bowel: No bowel obstruction or ileus. Normal appendix right lower quadrant. Diverticulosis of the descending and sigmoid colon  without diverticulitis. No bowel wall thickening or inflammatory change. Vascular/Lymphatic: Aortic atherosclerosis. No enlarged abdominal or pelvic lymph nodes. Reproductive: Prostate is unremarkable. Other: No free fluid or free intraperitoneal gas. No abdominal wall hernia. Musculoskeletal: Comminuted impacted subcapital left femoral neck fracture is identified, with ventral and varus angulation at the fracture site. No dislocation. No other acute displaced fractures. Moderate soft tissue swelling surrounding the left hip fracture. Reconstructed images demonstrate no additional findings. IMPRESSION: 1. Comminuted subcapital left femoral neck fracture. 2. Otherwise no acute intrathoracic, intra-abdominal, or intrapelvic trauma. 3.  Aortic Atherosclerosis (ICD10-I70.0). 4. Distal colonic diverticulosis without diverticulitis. Electronically Signed   By: Ozell Daring M.D.   On: 09/04/2023 16:49   DG Pelvis Portable Result Date: 09/04/2023 CLINICAL DATA:  Trauma fall EXAM: PORTABLE PELVIS 1-2 VIEWS COMPARISON:  None Available. FINDINGS: SI joints are non widened. Pubic symphysis and rami appear intact. Acute displaced left femoral neck fracture. No femoral head dislocation IMPRESSION: Acute displaced left femoral neck fracture. Electronically Signed   By: Luke Bun M.D.   On: 09/04/2023 15:58   DG Chest Port 1 View Result Date: 09/04/2023 CLINICAL DATA:  Trauma fall EXAM: PORTABLE CHEST 1 VIEW COMPARISON:  12/26/2022 FINDINGS: Post sternotomy changes with valve prosthesis and atrial appendage clip. Hyperinflated lungs. No focal opacity, pleural effusion, or pneumothorax. IMPRESSION: No active disease. Hyperinflated lungs. Electronically Signed   By: Luke Bun M.D.   On: 09/04/2023 15:57    Data Reviewed: Relevant notes from primary care and specialist visits, past discharge summaries as available in EHR, including Care Everywhere . Prior diagnostic testing as pertinent to current admission diagnoses, Updated medications and problem lists for reconciliation .ED course, including vitals, labs, imaging, treatment and response to treatment,Triage notes, nursing and pharmacy notes and ED provider's notes.Notable results as noted in HPI.Discussed case with EDMD/ ED APP/ or Specialty MD on call and as needed.  Assessment & Plan  >> Fall: CPK pending.Substance abuse Counsellor and resource on discharge per TOC.Fall precaution and PT eval post op per ortho recommendation.  >> Left subcapital femoral neck fracture: Orthopedics consulted with tentative plan for surgery will keep pt NPO.Pain control. Pt stable otherwise.   >>Abnormal EKG: EKG today shows sinus rhythm 92 with PR of 175 QRS of 93 prolonged QT at 505 and ST depression in anterolateral leads this is new when compared to his EKG from 26 June. Troponin ordered and pending.   >> Paroxysmal atrial fibrillation: Currently in sinus rhythm.  Patient's Eliquis  is discontinued, per cardiology note patient has a patent LAA clipping by CT.  Patient was continued on Lopressor  50 mg twice daily.  >> Essential hypertension: Current regimen as metoprolol  50 mg twice daily.  Micardis  40 mg.  Will continue to monitor blood pressure.  >> Chronic diastolic dysfunction: Currently grade II Diastolic dysfunction on echo in 2024 currently managed by metoprolol , rosuvastatin , telmisartan .   >> CAD without angina: Continue patient on pravastatin . No signs and symptoms of chest pain shortness of breath or any anginal equivalent symptoms.  Patient is followed by cardiology.  >>Rhabdomyolysis: Trauma tic rhabdo from fall.  Cont with D51/2nd  at 75.   >>Right ankle pain: We will get xray.    >>Hypokalemia: Replace and follow mag  WNL.    DVT prophylaxis:  SCD's  Consults:  Orthopedic.    Advance Care Planning:    Code Status: Full Code   Family Communication:  Wife.  Disposition Plan:  TBD. Severity of Illness: The appropriate patient status for  this patient is INPATIENT. Inpatient status is judged to be reasonable and necessary in order to provide the required intensity of service to ensure the patient's safety. The patient's presenting symptoms, physical exam findings, and initial radiographic and laboratory data in the context of their chronic comorbidities is felt to place them at high risk for further clinical deterioration. Furthermore, it is not anticipated that the patient will be medically stable for discharge from the hospital within 2 midnights of admission.   * I certify that at the point of admission it is my clinical judgment that the patient will require inpatient hospital care spanning beyond 2 midnights from the point of admission due to high intensity of service, high risk for further deterioration and high frequency of surveillance required.*  Unresulted Labs (From admission, onward)     Start     Ordered   09/05/23 0500  CBC  Tomorrow morning,   R        09/04/23 1856   09/05/23 0500  Basic metabolic panel  Tomorrow morning,   R        09/04/23 1856   09/04/23 1525  Urinalysis, Routine w reflex microscopic -Urine, Clean Catch  [facility] ED TRAUMA PANEL [facility])  Once,   URGENT       Question:  Specimen Source  Answer:  Urine, Clean Catch   09/04/23 1525            Meds ordered this encounter  Medications   PHENObarbital  (LUMINAL) injection 65 mg   thiamine  (VITAMIN B1) injection 100 mg   folic acid  (FOLVITE ) tablet 1 mg   lactated ringers  bolus 1,000 mL   lactated ringers  bolus 1,000 mL   iohexol  (OMNIPAQUE ) 350 MG/ML injection 75 mL   potassium chloride  SA (KLOR-CON  M) CR tablet 40 mEq   HYDROcodone -acetaminophen  (NORCO/VICODIN) 5-325 MG per tablet 1-2 tablet   morphine  (PF) 2  MG/ML injection 2 mg   OR Linked Order Group    methocarbamol (ROBAXIN) tablet 500 mg    methocarbamol (ROBAXIN) injection 500 mg   polyethylene glycol (MIRALAX  / GLYCOLAX ) packet 17 g   bisacodyl  (DULCOLAX) EC tablet 5 mg   OR Linked Order Group    LORazepam  (ATIVAN ) tablet 1-4 mg     CIWA-AR < 5 =:   0 mg     CIWA-AR 5 -10 =:   1 mg     CIWA-AR 11 -15 =:   2 mg     CIWA-AR 16 -20 =:   3 mg     CIWA-AR 16 -20 =:   Recheck CIWA-AR in 1 hour; if > 20 notify MD     CIWA-AR > 20 =:   4 mg     CIWA-AR > 20 =:   Call Rapid Response    LORazepam  (ATIVAN ) injection 1-4 mg     CIWA-AR < 5 =:   0 mg     CIWA-AR 5 -10 =:   1 mg     CIWA-AR 11 -15 =:   2 mg     CIWA-AR 16 -20 =:   3 mg     CIWA-AR 16 -20 =:   Recheck CIWA-AR in 1 hour; if > 20 notify MD     CIWA-AR > 20 =:   4 mg     CIWA-AR > 20 =:   Call Rapid Response   multivitamin with minerals tablet 1 tablet   hydrALAZINE  (APRESOLINE ) injection 10 mg   irbesartan  (AVAPRO ) tablet 150 mg  PreserVision AREDS 2 CAPS 1 capsule   metoprolol  tartrate (LOPRESSOR ) tablet 50 mg     Orders Placed This Encounter  Procedures   Critical Care   DG Chest Port 1 View   DG Pelvis Portable   CT HEAD WO CONTRAST   CT CERVICAL SPINE WO CONTRAST   CT CHEST ABDOMEN PELVIS W CONTRAST   DG Ankle 2 Views Right   Comprehensive metabolic panel   CBC   Ethanol   Urinalysis, Routine w reflex microscopic -Urine, Clean Catch   Protime-INR   CK   Magnesium    CBC   Basic metabolic panel   Diet NPO time specified   Diet NPO time specified   ED Cardiac monitoring   Measure blood pressure   Initiate Carrier Fluid Protocol   SCDs   Vital signs every hour x 4, then every 4 hours   Neurovascular checks every hour x 4, then every 4 hours   Bed with an overhead patient helper or overhead frame with a trapeze bar   Apply ice to affected area   Elevate heels off of bed   Turn cough deep breathe   Incentive spirometry   Bed rest with HOB to 30 degrees    Intake and output   If diabetic or glucose greater than 140mg /dl, notify physician to place Gylcemic Control (SSI) Order Set   Initiate Oral Care Protocol   Initiate Carrier Fluid Protocol   Document Pasero Opioid-Induced Sedation Scale (POSS) per protocol (see sidebar report)   Clinical institute withdrawal assessment   Vital signs every 6 hours X 48 hours, then per unit protocol   Refer to Sidebar Report for reference: ETOH Withdrawal Guidelines   Clinical Institute Withdrawal Assessent (CIWA)   If Ativan  given, reassess Clinical Institute Withdrawal Assessment (CIWA) with blood pressure and pulse rate within 1 hour of Ativan  administration   Notify Pharmacy to change IV Ativan  to PO if tolerating POs well.   Notify physician (specify)   Cardiac Monitoring Continuous x 48 hours Indications for use: Other; Other indications for use: FALL/ Syncope ?   Full code   Consult to orthopedic surgery   Consult to hospitalist   Consult to Transition of Care Team   Consult to Registered Dietitian   Consult to Transition of Care Team   ED Pulse oximetry, continuous   Oxygen therapy Mode or (Route): Nasal cannula; Liters Per Minute: 2   I-Stat Chem 8, ED   I-Stat Lactic Acid, ED   EKG 12-Lead   Sample to Blood Bank   Type and screen [facility]   Admit to Inpatient (patient's expected length of stay will be greater than 2 midnights or inpatient only procedure)    Author: Mario LULLA Blanch, MD 12 pm- 8 pm. [facility]. 09/04/2023 7:49 PM Please note for any communication after hours contact [facility] Assigned provider on call on Amion.

## 2023-09-04 NOTE — ED Triage Notes (Addendum)
 Pt BIB [facility] from home for fall and hip injury. Per EMS wife last spoke with pt 1330 yesterday, found him on ground 1330 today, pt reports he thinks he fell around 1600, is unsure why he fell. No LOC, did not hit head. L hip shortening and rotation noted. EMS found empty alcohol bottles on scene. Pt received 200 mcgs fentanyl  by EMS over the last hour and 4 of Zofran . Pt has been on Eliquis  until 6/26 when his Dr. discontinued it. Aox4 and VSS per EMS.

## 2023-09-04 NOTE — ED Notes (Signed)
 Patient returned from MRI.

## 2023-09-05 ENCOUNTER — Encounter (HOSPITAL_COMMUNITY)
Admission: EM | Disposition: A | Discharge status: Skilled Nursing Facility | Payer: Self-pay | Source: Home / Self Care | Attending: Internal Medicine

## 2023-09-05 ENCOUNTER — Inpatient Hospital Stay (HOSPITAL_COMMUNITY)

## 2023-09-05 ENCOUNTER — Other Ambulatory Visit: Payer: Self-pay

## 2023-09-05 ENCOUNTER — Inpatient Hospital Stay (HOSPITAL_COMMUNITY): Admitting: Anesthesiology

## 2023-09-05 ENCOUNTER — Encounter (HOSPITAL_COMMUNITY): Payer: Self-pay | Admitting: Internal Medicine

## 2023-09-05 DIAGNOSIS — I10 Essential (primary) hypertension: Secondary | ICD-10-CM

## 2023-09-05 DIAGNOSIS — S72002A Fracture of unspecified part of neck of left femur, initial encounter for closed fracture: Secondary | ICD-10-CM | POA: Diagnosis not present

## 2023-09-05 DIAGNOSIS — R55 Syncope and collapse: Secondary | ICD-10-CM

## 2023-09-05 DIAGNOSIS — Z01810 Encounter for preprocedural cardiovascular examination: Secondary | ICD-10-CM

## 2023-09-05 DIAGNOSIS — E785 Hyperlipidemia, unspecified: Secondary | ICD-10-CM | POA: Diagnosis not present

## 2023-09-05 DIAGNOSIS — F10139 Alcohol abuse with withdrawal, unspecified: Secondary | ICD-10-CM | POA: Diagnosis not present

## 2023-09-05 DIAGNOSIS — R29703 NIHSS score 3: Secondary | ICD-10-CM

## 2023-09-05 DIAGNOSIS — I480 Paroxysmal atrial fibrillation: Secondary | ICD-10-CM | POA: Diagnosis not present

## 2023-09-05 DIAGNOSIS — I639 Cerebral infarction, unspecified: Secondary | ICD-10-CM | POA: Diagnosis not present

## 2023-09-05 DIAGNOSIS — W19XXXA Unspecified fall, initial encounter: Secondary | ICD-10-CM

## 2023-09-05 DIAGNOSIS — I499 Cardiac arrhythmia, unspecified: Secondary | ICD-10-CM

## 2023-09-05 DIAGNOSIS — S72012A Unspecified intracapsular fracture of left femur, initial encounter for closed fracture: Secondary | ICD-10-CM

## 2023-09-05 DIAGNOSIS — E440 Moderate protein-calorie malnutrition: Secondary | ICD-10-CM | POA: Insufficient documentation

## 2023-09-05 HISTORY — PX: TOTAL HIP ARTHROPLASTY: SHX124

## 2023-09-05 LAB — BASIC METABOLIC PANEL WITH GFR
Anion gap: 10 (ref 5–15)
BUN: 13 mg/dL (ref 8–23)
CO2: 29 mmol/L (ref 22–32)
Calcium: 8.5 mg/dL — ABNORMAL LOW (ref 8.9–10.3)
Chloride: 98 mmol/L (ref 98–111)
Creatinine, Ser: 0.73 mg/dL (ref 0.61–1.24)
GFR, Estimated: >60 mL/min (ref >60)
Glucose, Bld: 117 mg/dL — ABNORMAL HIGH (ref 70–99)
Potassium: 3 mmol/L — ABNORMAL LOW (ref 3.5–5.1)
Sodium: 137 mmol/L (ref 135–145)

## 2023-09-05 LAB — CBC
HCT: 36.4 % — ABNORMAL LOW (ref 39.0–52.0)
HCT: 35.9 % — ABNORMAL LOW (ref 39.0–52.0)
Hemoglobin: 12.6 g/dL — ABNORMAL LOW (ref 13.0–17.0)
Hemoglobin: 12.3 g/dL — ABNORMAL LOW (ref 13.0–17.0)
MCH: 30.7 pg (ref 26.0–34.0)
MCH: 30.1 pg (ref 26.0–34.0)
MCHC: 34.6 g/dL (ref 30.0–36.0)
MCHC: 34.3 g/dL (ref 30.0–36.0)
MCV: 88.6 fL (ref 80.0–100.0)
MCV: 87.8 fL (ref 80.0–100.0)
Platelets: 156 10*3/uL (ref 150–400)
Platelets: 138 10*3/uL — ABNORMAL LOW (ref 150–400)
RBC: 4.11 MIL/uL — ABNORMAL LOW (ref 4.22–5.81)
RBC: 4.09 MIL/uL — ABNORMAL LOW (ref 4.22–5.81)
RDW: 14.2 % (ref 11.5–15.5)
RDW: 14 % (ref 11.5–15.5)
WBC: 10.5 10*3/uL (ref 4.0–10.5)
WBC: 12.1 10*3/uL — ABNORMAL HIGH (ref 4.0–10.5)
nRBC: 0 % (ref 0.0–0.2)
nRBC: 0 % (ref 0.0–0.2)

## 2023-09-05 LAB — SURGICAL PCR SCREEN
MRSA, PCR: NEGATIVE
Staphylococcus aureus: NEGATIVE

## 2023-09-05 LAB — TROPONIN I (HIGH SENSITIVITY): Troponin I (High Sensitivity): 50 ng/L — ABNORMAL HIGH (ref <18)

## 2023-09-05 LAB — CREATININE, SERUM
Creatinine, Ser: 0.76 mg/dL (ref 0.61–1.24)
GFR, Estimated: >60 mL/min (ref >60)

## 2023-09-05 SURGERY — ARTHROPLASTY, HIP, TOTAL, ANTERIOR APPROACH
Anesthesia: General | Site: Hip | Laterality: Left

## 2023-09-05 MED ORDER — TRANEXAMIC ACID 1000 MG/10ML IV SOLN
INTRAVENOUS | Status: DC | PRN
  Administered 2023-09-05: 2000 mg via TOPICAL

## 2023-09-05 MED ORDER — POVIDONE-IODINE 10 % EX SWAB: 2.0000 | Freq: Once | CUTANEOUS | Status: DC

## 2023-09-05 MED ORDER — TRANEXAMIC ACID-NACL 1000-0.7 MG/100ML-% IV SOLN
INTRAVENOUS | Status: AC
  Filled 2023-09-05: qty 100

## 2023-09-05 MED ORDER — ENOXAPARIN SODIUM 40 MG/0.4ML IJ SOSY
40.0000 mg | PREFILLED_SYRINGE | INTRAMUSCULAR | Status: DC
  Administered 2023-09-06 – 2023-09-09 (×4): 40 mg via SUBCUTANEOUS
  Filled 2023-09-05 (×4): qty 0.4

## 2023-09-05 MED ORDER — POLYETHYLENE GLYCOL 3350 17 G PO PACK
17.0000 g | PACK | Freq: Every day | ORAL | Status: DC | PRN
  Administered 2023-09-06: 17 g via ORAL
  Filled 2023-09-05: qty 1

## 2023-09-05 MED ORDER — FENTANYL CITRATE (PF) 100 MCG/2ML IJ SOLN
25.0000 ug | INTRAMUSCULAR | Status: DC | PRN
  Administered 2023-09-05 (×2): 25 ug via INTRAVENOUS

## 2023-09-05 MED ORDER — OXYCODONE HCL 5 MG PO TABS: 5.0000 mg | ORAL_TABLET | Freq: Once | ORAL | Status: DC | PRN

## 2023-09-05 MED ORDER — TRANEXAMIC ACID-NACL 1000-0.7 MG/100ML-% IV SOLN
1000.0000 mg | INTRAVENOUS | Status: AC
  Administered 2023-09-05: 1000 mg via INTRAVENOUS

## 2023-09-05 MED ORDER — OXYCODONE HCL 5 MG PO TABS
10.0000 mg | ORAL_TABLET | ORAL | Status: DC | PRN
  Administered 2023-09-06 – 2023-09-10 (×8): 10 mg via ORAL
  Filled 2023-09-05 (×4): qty 2

## 2023-09-05 MED ORDER — POTASSIUM CHLORIDE 10 MEQ/100ML IV SOLN
10.0000 meq | INTRAVENOUS | Status: AC
  Administered 2023-09-05 (×2): 10 meq via INTRAVENOUS
  Filled 2023-09-05 (×2): qty 100

## 2023-09-05 MED ORDER — ORAL CARE MOUTH RINSE: 15.0000 mL | Freq: Once | OROMUCOSAL | Status: AC

## 2023-09-05 MED ORDER — CHLORHEXIDINE GLUCONATE 4 % EX SOLN: 60.0000 mL | Freq: Once | CUTANEOUS | Status: DC

## 2023-09-05 MED ORDER — TRANEXAMIC ACID 1000 MG/10ML IV SOLN
2000.0000 mg | Freq: Once | INTRAVENOUS | Status: DC
  Filled 2023-09-05: qty 20

## 2023-09-05 MED ORDER — METHOCARBAMOL 1000 MG/10ML IJ SOLN: 500.0000 mg | Freq: Four times a day (QID) | INTRAMUSCULAR | Status: DC | PRN

## 2023-09-05 MED ORDER — MUPIROCIN 2 % EX OINT
1.0000 | TOPICAL_OINTMENT | Freq: Two times a day (BID) | CUTANEOUS | Status: AC
  Administered 2023-09-05 – 2023-09-09 (×10): 1 via NASAL
  Filled 2023-09-05 (×2): qty 22

## 2023-09-05 MED ORDER — VANCOMYCIN HCL 1000 MG IV SOLR
INTRAVENOUS | Status: AC
  Filled 2023-09-05: qty 20

## 2023-09-05 MED ORDER — PRONTOSAN WOUND IRRIGATION OPTIME
TOPICAL | Status: DC | PRN
  Administered 2023-09-05: 350 mL

## 2023-09-05 MED ORDER — BUPIVACAINE-MELOXICAM ER 400-12 MG/14ML IJ SOLN
INTRAMUSCULAR | Status: AC
  Filled 2023-09-05: qty 1

## 2023-09-05 MED ORDER — ONDANSETRON HCL 4 MG/2ML IJ SOLN: 4.0000 mg | Freq: Four times a day (QID) | INTRAMUSCULAR | Status: DC | PRN

## 2023-09-05 MED ORDER — DOCUSATE SODIUM 100 MG PO CAPS
100.0000 mg | ORAL_CAPSULE | Freq: Two times a day (BID) | ORAL | Status: DC
  Administered 2023-09-05 – 2023-09-10 (×10): 100 mg via ORAL
  Filled 2023-09-05 (×10): qty 1

## 2023-09-05 MED ORDER — PHENOL 1.4 % MT LIQD: 1.0000 | OROMUCOSAL | Status: DC | PRN

## 2023-09-05 MED ORDER — TRANEXAMIC ACID-NACL 1000-0.7 MG/100ML-% IV SOLN
1000.0000 mg | Freq: Once | INTRAVENOUS | Status: AC
  Administered 2023-09-05: 1000 mg via INTRAVENOUS
  Filled 2023-09-05: qty 100

## 2023-09-05 MED ORDER — HYDROMORPHONE HCL 1 MG/ML IJ SOLN
0.5000 mg | INTRAMUSCULAR | Status: DC | PRN
  Administered 2023-09-05 – 2023-09-09 (×12): 1 mg via INTRAVENOUS
  Filled 2023-09-05 (×12): qty 1

## 2023-09-05 MED ORDER — OXYCODONE HCL 5 MG/5ML PO SOLN: 5.0000 mg | Freq: Once | ORAL | Status: DC | PRN

## 2023-09-05 MED ORDER — ROCURONIUM BROMIDE 10 MG/ML (PF) SYRINGE
PREFILLED_SYRINGE | INTRAVENOUS | Status: DC | PRN
  Administered 2023-09-05: 20 mg via INTRAVENOUS
  Administered 2023-09-05: 50 mg via INTRAVENOUS

## 2023-09-05 MED ORDER — LIDOCAINE 2% (20 MG/ML) 5 ML SYRINGE
INTRAMUSCULAR | Status: DC | PRN
  Administered 2023-09-05: 40 mg via INTRAVENOUS

## 2023-09-05 MED ORDER — METHOCARBAMOL 500 MG PO TABS
500.0000 mg | ORAL_TABLET | Freq: Four times a day (QID) | ORAL | Status: DC | PRN
  Administered 2023-09-06 – 2023-09-09 (×6): 500 mg via ORAL
  Filled 2023-09-05 (×6): qty 1

## 2023-09-05 MED ORDER — MIDAZOLAM HCL 2 MG/2ML IJ SOLN
INTRAMUSCULAR | Status: AC
  Filled 2023-09-05: qty 2

## 2023-09-05 MED ORDER — LACTATED RINGERS IV SOLN: INTRAVENOUS | Status: DC

## 2023-09-05 MED ORDER — BUPIVACAINE-MELOXICAM ER 400-12 MG/14ML IJ SOLN
INTRAMUSCULAR | Status: DC | PRN
  Administered 2023-09-05: 400 mg

## 2023-09-05 MED ORDER — EPHEDRINE SULFATE-NACL 50-0.9 MG/10ML-% IV SOSY
PREFILLED_SYRINGE | INTRAVENOUS | Status: DC | PRN
  Administered 2023-09-05 (×2): 5 mg via INTRAVENOUS

## 2023-09-05 MED ORDER — ONDANSETRON HCL 4 MG/2ML IJ SOLN
INTRAMUSCULAR | Status: DC | PRN
  Administered 2023-09-05: 4 mg via INTRAVENOUS

## 2023-09-05 MED ORDER — CHLORHEXIDINE GLUCONATE 0.12 % MT SOLN
OROMUCOSAL | Status: AC
  Administered 2023-09-05: 15 mL via OROMUCOSAL
  Filled 2023-09-05: qty 15

## 2023-09-05 MED ORDER — OXYCODONE HCL 5 MG PO TABS
5.0000 mg | ORAL_TABLET | ORAL | Status: DC | PRN
  Administered 2023-09-07: 5 mg via ORAL
  Administered 2023-09-07 – 2023-09-10 (×6): 10 mg via ORAL
  Filled 2023-09-05 (×10): qty 2

## 2023-09-05 MED ORDER — TRANEXAMIC ACID-NACL 1000-0.7 MG/100ML-% IV SOLN
INTRAVENOUS | Status: AC
Start: 2023-09-05 — End: 2023-09-05
  Filled 2023-09-05: qty 100

## 2023-09-05 MED ORDER — SORBITOL 70 % SOLN
30.0000 mL | Freq: Every day | Status: DC | PRN
Start: 2023-09-05 — End: 2023-09-10

## 2023-09-05 MED ORDER — CEFAZOLIN SODIUM-DEXTROSE 2-4 GM/100ML-% IV SOLN
2.0000 g | Freq: Four times a day (QID) | INTRAVENOUS | Status: AC
  Administered 2023-09-05 – 2023-09-06 (×3): 2 g via INTRAVENOUS
  Filled 2023-09-05 (×3): qty 100

## 2023-09-05 MED ORDER — ALUM & MAG HYDROXIDE-SIMETH 200-200-20 MG/5ML PO SUSP
30.0000 mL | ORAL | Status: DC | PRN
Start: 2023-09-05 — End: 2023-09-10

## 2023-09-05 MED ORDER — POTASSIUM CHLORIDE 10 MEQ/100ML IV SOLN
10.0000 meq | INTRAVENOUS | Status: AC
  Administered 2023-09-05 (×2): 10 meq via INTRAVENOUS
  Filled 2023-09-05: qty 100

## 2023-09-05 MED ORDER — PHENYLEPHRINE HCL-NACL 20-0.9 MG/250ML-% IV SOLN
INTRAVENOUS | Status: DC | PRN
  Administered 2023-09-05: 40 ug/min via INTRAVENOUS

## 2023-09-05 MED ORDER — PROPOFOL 10 MG/ML IV BOLUS
INTRAVENOUS | Status: DC | PRN
  Administered 2023-09-05: 90 mg via INTRAVENOUS
  Administered 2023-09-05: 50 mg via INTRAVENOUS

## 2023-09-05 MED ORDER — ACETAMINOPHEN 10 MG/ML IV SOLN: 1000.0000 mg | Freq: Once | INTRAVENOUS | Status: DC | PRN

## 2023-09-05 MED ORDER — HYDRALAZINE HCL 20 MG/ML IJ SOLN: 10.0000 mg | INTRAMUSCULAR | Status: DC | PRN

## 2023-09-05 MED ORDER — 0.9 % SODIUM CHLORIDE (POUR BTL) OPTIME
TOPICAL | Status: DC | PRN
  Administered 2023-09-05: 1000 mL

## 2023-09-05 MED ORDER — HYDRALAZINE HCL 20 MG/ML IJ SOLN
10.0000 mg | Freq: Four times a day (QID) | INTRAMUSCULAR | Status: DC | PRN
  Administered 2023-09-06 – 2023-09-08 (×3): 10 mg via INTRAVENOUS
  Filled 2023-09-05 (×4): qty 1

## 2023-09-05 MED ORDER — CEFAZOLIN SODIUM-DEXTROSE 2-4 GM/100ML-% IV SOLN
2.0000 g | INTRAVENOUS | Status: AC
  Administered 2023-09-05: 2 g via INTRAVENOUS

## 2023-09-05 MED ORDER — ROSUVASTATIN CALCIUM 20 MG PO TABS
20.0000 mg | ORAL_TABLET | Freq: Every day | ORAL | Status: DC
  Administered 2023-09-06 – 2023-09-10 (×5): 20 mg via ORAL
  Filled 2023-09-05 (×5): qty 1

## 2023-09-05 MED ORDER — ACETAMINOPHEN 500 MG PO TABS
ORAL_TABLET | ORAL | Status: AC
  Administered 2023-09-05: 1000 mg
  Filled 2023-09-05: qty 2

## 2023-09-05 MED ORDER — FENTANYL CITRATE (PF) 100 MCG/2ML IJ SOLN
INTRAMUSCULAR | Status: AC
Start: 2023-09-05 — End: 2023-09-05
  Filled 2023-09-05: qty 2

## 2023-09-05 MED ORDER — VANCOMYCIN HCL 1 G IV SOLR
INTRAVENOUS | Status: DC | PRN
  Administered 2023-09-05: 1000 mg

## 2023-09-05 MED ORDER — CHLORHEXIDINE GLUCONATE 0.12 % MT SOLN
15.0000 mL | Freq: Once | OROMUCOSAL | Status: AC
Start: 2023-09-05 — End: 2023-09-05

## 2023-09-05 MED ORDER — ACETAMINOPHEN 325 MG PO TABS
325.0000 mg | ORAL_TABLET | Freq: Four times a day (QID) | ORAL | Status: DC | PRN
  Administered 2023-09-06: 650 mg via ORAL
  Filled 2023-09-05: qty 2

## 2023-09-05 MED ORDER — DEXAMETHASONE SODIUM PHOSPHATE 10 MG/ML IJ SOLN
INTRAMUSCULAR | Status: DC | PRN
  Administered 2023-09-05: 5 mg via INTRAVENOUS

## 2023-09-05 MED ORDER — PHENYLEPHRINE 80 MCG/ML (10ML) SYRINGE FOR IV PUSH (FOR BLOOD PRESSURE SUPPORT)
PREFILLED_SYRINGE | INTRAVENOUS | Status: DC | PRN
  Administered 2023-09-05 (×5): 80 ug via INTRAVENOUS

## 2023-09-05 MED ORDER — SUGAMMADEX SODIUM 200 MG/2ML IV SOLN
INTRAVENOUS | Status: DC | PRN
  Administered 2023-09-05: 140 mg via INTRAVENOUS

## 2023-09-05 MED ORDER — MENTHOL 3 MG MT LOZG: 1.0000 | LOZENGE | OROMUCOSAL | Status: DC | PRN

## 2023-09-05 MED ORDER — PROPOFOL 1000 MG/100ML IV EMUL
INTRAVENOUS | Status: AC
  Filled 2023-09-05: qty 100

## 2023-09-05 MED ORDER — FENTANYL CITRATE (PF) 250 MCG/5ML IJ SOLN
INTRAMUSCULAR | Status: AC
  Filled 2023-09-05: qty 5

## 2023-09-05 MED ORDER — PROPOFOL 10 MG/ML IV BOLUS
INTRAVENOUS | Status: AC
  Filled 2023-09-05: qty 20

## 2023-09-05 MED ORDER — PROPOFOL 10 MG/ML IV BOLUS
INTRAVENOUS | Status: AC
Start: 2023-09-05 — End: 2023-09-05
  Filled 2023-09-05: qty 20

## 2023-09-05 MED ORDER — MAGNESIUM CITRATE PO SOLN: 1.0000 | Freq: Once | ORAL | Status: DC | PRN

## 2023-09-05 MED ORDER — SODIUM CHLORIDE 0.9 % IR SOLN
Status: DC | PRN
  Administered 2023-09-05: 1000 mL

## 2023-09-05 MED ORDER — ONDANSETRON HCL 4 MG PO TABS: 4.0000 mg | ORAL_TABLET | Freq: Four times a day (QID) | ORAL | Status: DC | PRN

## 2023-09-05 MED ORDER — ACETAMINOPHEN 500 MG PO TABS
1000.0000 mg | ORAL_TABLET | Freq: Four times a day (QID) | ORAL | Status: AC
Start: 2023-09-05 — End: 2023-09-06
  Administered 2023-09-06 (×2): 1000 mg via ORAL
  Filled 2023-09-05 (×3): qty 2

## 2023-09-05 MED ORDER — FENTANYL CITRATE (PF) 250 MCG/5ML IJ SOLN
INTRAMUSCULAR | Status: DC | PRN
  Administered 2023-09-05 (×5): 50 ug via INTRAVENOUS

## 2023-09-05 MED ORDER — CEFAZOLIN SODIUM-DEXTROSE 2-4 GM/100ML-% IV SOLN
INTRAVENOUS | Status: AC
  Filled 2023-09-05: qty 100

## 2023-09-05 SURGICAL SUPPLY — 54 items
BAG COUNTER SPONGE SURGICOUNT (BAG) ×1 IMPLANT
BAG DECANTER FOR FLEXI CONT (MISCELLANEOUS) ×1 IMPLANT
BLADE SAG 18X100X1.27 (BLADE) ×1 IMPLANT
COVER PERINEAL POST (MISCELLANEOUS) ×1 IMPLANT
COVER SURGICAL LIGHT HANDLE (MISCELLANEOUS) ×1 IMPLANT
CUP ACETAB W/GRIPTION 54 (Plate) IMPLANT
DERMABOND ADVANCED .7 DNX12 (GAUZE/BANDAGES/DRESSINGS) IMPLANT
DRAPE C-ARM 42X72 X-RAY (DRAPES) ×1 IMPLANT
DRAPE POUCH INSTRU U-SHP 10X18 (DRAPES) ×1 IMPLANT
DRAPE STERI IOBAN 125X83 (DRAPES) ×1 IMPLANT
DRAPE U-SHAPE 47X51 STRL (DRAPES) ×2 IMPLANT
DRSG AQUACEL AG ADV 3.5X 6 (GAUZE/BANDAGES/DRESSINGS) IMPLANT
DRSG AQUACEL AG ADV 3.5X10 (GAUZE/BANDAGES/DRESSINGS) ×1 IMPLANT
DURAPREP 26ML APPLICATOR (WOUND CARE) ×2 IMPLANT
ELECTRODE BLDE 4.0 EZ CLN MEGD (MISCELLANEOUS) ×1 IMPLANT
ELECTRODE REM PT RTRN 9FT ADLT (ELECTROSURGICAL) ×1 IMPLANT
GLOVE BIOGEL PI IND STRL 7.0 (GLOVE) ×2 IMPLANT
GLOVE BIOGEL PI IND STRL 7.5 (GLOVE) ×5 IMPLANT
GLOVE ECLIPSE 7.0 STRL STRAW (GLOVE) ×2 IMPLANT
GLOVE SKINSENSE STRL SZ7.5 (GLOVE) ×1 IMPLANT
GLOVE SURG SYN 7.5 E (GLOVE) ×2 IMPLANT
GLOVE SURG SYN 7.5 PF PI (GLOVE) ×2 IMPLANT
GLOVE SURG UNDER POLY LF SZ7 (GLOVE) ×3 IMPLANT
GLOVE SURG UNDER POLY LF SZ7.5 (GLOVE) ×2 IMPLANT
GOWN STRL REUS W/ TWL LRG LVL3 (GOWN DISPOSABLE) IMPLANT
GOWN STRL REUS W/ TWL XL LVL3 (GOWN DISPOSABLE) ×1 IMPLANT
GOWN STRL SURGICAL XL XLNG (GOWN DISPOSABLE) ×1 IMPLANT
GOWN TOGA ZIPPER T7+ PEEL AWAY (MISCELLANEOUS) ×1 IMPLANT
HEAD CERAMIC 36 PLUS 8.5 12 14 (Hips) IMPLANT
HOOD PEEL AWAY T7 (MISCELLANEOUS) ×1 IMPLANT
IV NS IRRIG 3000ML ARTHROMATIC (IV SOLUTION) ×1 IMPLANT
KIT BASIN OR (CUSTOM PROCEDURE TRAY) ×1 IMPLANT
LINER NEUTRAL 54X36MM PLUS 4 (Hips) IMPLANT
MARKER SKIN DUAL TIP RULER LAB (MISCELLANEOUS) ×1 IMPLANT
NDL SPNL 18GX3.5 QUINCKE PK (NEEDLE) ×1 IMPLANT
NEEDLE SPNL 18GX3.5 QUINCKE PK (NEEDLE) ×1 IMPLANT
PACK TOTAL JOINT (CUSTOM PROCEDURE TRAY) ×1 IMPLANT
PACK UNIVERSAL I (CUSTOM PROCEDURE TRAY) ×1 IMPLANT
SCREW 6.5MMX30MM (Screw) IMPLANT
SET HNDPC FAN SPRY TIP SCT (DISPOSABLE) ×1 IMPLANT
SOLUTION PRONTOSAN WOUND 350ML (IRRIGATION / IRRIGATOR) ×1 IMPLANT
STEM FEMORAL SZ9 HIGH ACTIS (Stem) IMPLANT
SUT ETHIBOND 2 V 37 (SUTURE) ×1 IMPLANT
SUT ETHILON 2 0 PSLX (SUTURE) IMPLANT
SUT STRATAFIX PDS+ 0 24IN (SUTURE) IMPLANT
SUT VIC AB 0 CT1 27XBRD ANBCTR (SUTURE) ×1 IMPLANT
SUT VIC AB 1 CTX36XBRD ANBCTR (SUTURE) ×1 IMPLANT
SUT VIC AB 2-0 CT1 TAPERPNT 27 (SUTURE) ×2 IMPLANT
SYR 50ML LL SCALE MARK (SYRINGE) ×1 IMPLANT
TOWEL GREEN STERILE (TOWEL DISPOSABLE) ×1 IMPLANT
TRAY CATH INTERMITTENT SS 16FR (CATHETERS) IMPLANT
TRAY FOLEY W/BAG SLVR 16FR ST (SET/KITS/TRAYS/PACK) IMPLANT
TUBE SUCT ARGYLE STRL (TUBING) ×1 IMPLANT
YANKAUER SUCT BULB TIP NO VENT (SUCTIONS) ×1 IMPLANT

## 2023-09-05 NOTE — Op Note (Signed)
 ARTHROPLASTY, HIP, TOTAL, ANTERIOR APPROACH  Procedure Note James Ibarra   [id number]  Pre-op Diagnosis: left transcervical femoral neck fracture     Post-op Diagnosis: same  Operative Findings Acute femoral neck fracture   Operative Procedures  1. Total hip replacement; Left hip; uncemented [id number]   Surgeon: Kay Cummins, M.D.  Assist: James Morna Grave, PA-C   Anesthesia: general  Prosthesis: Depuy Acetabulum: Pinnacle 54 mm Femur: Actis 9 HO Head: 36 mm size: +8.5 Liner: +4 Bearing Type: ceramic/poly  Total Hip Arthroplasty (Anterior Approach) Op Note:  After informed consent was obtained and the operative extremity marked in the holding area, the patient was brought back to the operating room and placed supine on the HANA table. Next, the operative extremity was prepped and draped in normal sterile fashion. Surgical timeout occurred verifying patient identification, surgical site, surgical procedure and administration of antibiotics.  A 10 cm longitudinal incision was made starting from 2 fingerbreadths lateral and inferior to the ASIS towards the lateral aspect of the patella.  A Hueter approach to the hip was performed, using the interval between tensor fascia lata and sartorius.  Dissection was carried bluntly down onto the anterior hip capsule. The lateral femoral circumflex vessels were identified and coagulated. A capsulotomy was performed and the capsular flaps tagged for later repair.  The neck osteotomy was performed 1 fingerbreadth above the lesser trochanter below the level of the fracture. The femoral head was removed, the acetabular rim was cleared of soft tissue and attention was turned to reaming the acetabulum.  Sequential reaming was performed under fluoroscopic guidance down to the floor of the cotyloid fossa. We reamed to a size 53 mm, and then impacted the acetabular shell. A 30 mm cancellous screw was placed to secure the shell.  A +4 neutral liner was  then placed after irrigation and attention turned to the femur.  After placing the femoral hook, the leg was taken to externally rotated, extended and adducted position taking care to perform soft tissue releases to allow for adequate mobilization of the femur. Soft tissue was cleared from the shoulder of the greater trochanter and the hook elevator used to improve exposure of the proximal femur.  Lateral bone from the shoulder was rasped away for relief.  Sequential broaching performed up to a size 9.  High offset trial neck and +1.5 head were placed. The leg was brought back up to neutral and the construct reduced.  The position and sizing of components, offset and leg lengths were checked using fluoroscopy. Stability of the construct was checked in 45 degrees of hip extension and 90 degrees of external rotation without any subluxation, shuck or impingement of prosthesis.  He needed more leg length therefore we trialed up to a +8.5 femoral head which I found was the best for restoration of offset and leg length.  We dislocated the prosthesis, dropped the leg back into position, removed trial components, and irrigated copiously. The final stem and head was then placed, the leg brought back up, the system reduced and fluoroscopy used to verify positioning.  Antibiotic irrigation was placed in the surgical wound.   We irrigated, obtained hemostasis and closed the capsule using #2 ethibond suture.  A topical mixture of 0.25% bupivacaine  and meloxicam was placed deep to the fascia.  One gram of vancomycin  powder was placed in the surgical bed.   One gram of topical tranexamic acid  was injected into the joint.  The fascia was closed with #1 stratafix, the  deep fat layer was closed with 0 vicryl, the subcutaneous layers closed with 2.0 Vicryl Plus and the skin closed with 2.0 nylon and dermabond. A sterile dressing was applied. The patient was awakened in the operating room and taken to recovery in stable condition.   All sponge, needle, and instrument counts were correct at the end of the case.   Morna Ibarra, my PA, was a medical necessity for opening, closing, limb positioning, retracting, exposing, and overall facilitation and timely completion of the surgery.  Position: supine  Complications: see description of procedure.  Time Out: performed   Drains/Packing: none  Estimated blood loss: see anesthesia record  Returned to Recovery Room: in good condition.   Antibiotics: yes   Mechanical VTE (DVT) Prophylaxis: sequential compression devices, TED thigh-high  Chemical VTE (DVT) Prophylaxis: lovenox  POD 1   Fluid Replacement: see anesthesia record  Specimens Removed: 1 to pathology   Sponge and Instrument Count Correct? yes   PACU: portable radiograph - low AP   Plan/RTC: Return in 2 weeks for suture removal. Weight Bearing/Load Lower Extremity: full  Hip precautions: none Suture Removal: 2 weeks   N. Ozell Cummins, MD James Ibarra 3:10 PM   Implant Name Type Inv. Item Serial No. Manufacturer Lot No. LRB No. Used Action  CUP ACETAB W/GRIPTION 54 - [id number] Plate CUP ACETAB W/GRIPTION 54  [employer] [id number] Left 1 Implanted  LINER NEUTRAL 54X36MM PLUS 4 - [id number] Hips LINER NEUTRAL 54X36MM PLUS 4  [employer] [id number] Left 1 Implanted  SCREW 6.5MMX30MM - [id number] Screw SCREW 6.5MMX30MM  [employer] [id number] Left 1 Implanted  STEM FEMORAL SZ9 HIGH ACTIS - [id number] Stem STEM FEMORAL SZ9 HIGH ACTIS  [employer] [id number] Left 1 Implanted  HEAD CERAMIC 36 PLUS 8.5 12 14  - [id number] Hips HEAD CERAMIC 36 PLUS 8.5 12 14   [employer] [id number] Left 1 Implanted

## 2023-09-05 NOTE — Progress Notes (Signed)
 Initial Nutrition Assessment  DOCUMENTATION CODES:   Non-severe (moderate) malnutrition in context of social or environmental circumstances (decreased intake and poor PO)  INTERVENTION:  When pt advanced to diet after surgery, recommend: Liberalized diet to regular to provide increased options and promote adequate intake that will help pt meet increased calorie and protein needs for post op healing Ensure Plus High Protein po BID, each supplement provides 350 kcal and 20 grams of protein. Magic cup TID with meals, each supplement provides 290 kcal and 9 grams of protein  Daily multivitamin w/ minerals  Added discharge nutrition instructions to AVS  NUTRITION DIAGNOSIS:   Moderate Malnutrition related to social / environmental circumstances as evidenced by moderate muscle depletion, moderate fat depletion.  GOAL:   Patient will meet greater than or equal to 90% of their needs  MONITOR:   PO intake, Supplement acceptance, Diet advancement  REASON FOR ASSESSMENT:   Consult Assessment of nutrition requirement/status  ASSESSMENT:   Pt with hx alcohol use, CAD, atrial fibrillation, HTN, HLD,  and mitral regurgitation. Hx of mitral valve surgery and L atrial appendage (12/2022),  MAZE (12/2022), and atrial fibrillation ablation (05/2023). Admitted following fall with L femoral neck fx.  7/2 L total hip arthroplasty   Pt awake and alert resting in bed at time of assessment. Pt expressed he was in serious pain and was having a hard time answering questions. Unable to obtain adequate hx from pt, but called pt's wife to discuss. Wife reports pt had experienced decreased appetite 1 week leading up to admission. Wife reports she found pt down after getting home from being away and suspects pt had been down for at least 1 day without food or drink when she found him. Wife reports pt would typically drink 2 Boost shakes per day and usually ate 1 big meal at dinner time which consisted of meat,  vegetable and side. Pt reports he had noticed wt loss recently, but unsure of exact amount or timeframe. He expressed clothes were fitting loosely and decreased muscle strength.   Wife reports pt was independent PTA and did not rely on any assistive devices for mobility.   Nutrition focused physical exam shows moderate fat depletions and moderate to severe muscle depletions. Suspect malnutrition in context of social/environmental due to pt's decreased PO intake.   Medications reviewed and include:  Prosight MVI MVI w/ minerals Potassium chloride   Labs reviewed:  Potassium 3.0  NUTRITION - FOCUSED PHYSICAL EXAM:  Flowsheet Row Most Recent Value  Orbital Region Moderate depletion  Upper Arm Region Moderate depletion  Thoracic and Lumbar Region Moderate depletion  Buccal Region Moderate depletion  Temple Region Moderate depletion  Clavicle Bone Region Severe depletion  Clavicle and Acromion Bone Region Severe depletion  Scapular Bone Region Severe depletion  Dorsal Hand Severe depletion  Patellar Region Moderate depletion  [avoided L due to hip fx]  Anterior Thigh Region Moderate depletion  [avoided L due to hip fx]  Posterior Calf Region Moderate depletion  [avoided L due to hip fx]  Edema (RD Assessment) None  Hair Reviewed  Eyes Reviewed  Mouth Reviewed  Skin Reviewed  Nails Reviewed    Diet Order:   Diet Order             Diet NPO time specified Except for: Sips with Meds, Ice Chips  Diet effective now                   EDUCATION NEEDS:   Not appropriate  for education at this time  Skin:  Skin Assessment: Skin Integrity Issues: Skin Integrity Issues:: Incisions Incisions: L hip  Last BM:  6/30  Height:   Ht Readings from Last 1 Encounters:  09/05/23 6' 1 (1.854 m)   Weight:   Wt Readings from Last 1 Encounters:  09/05/23 70.3 kg   Ideal Body Weight:  83.6 kg  BMI:  Body mass index is 20.45 kg/m.  Estimated Nutritional Needs:   Kcal:   2100-2300  Protein:  85-100g  Fluid:  >/=2L   Josette Glance, MS, RDN, LDN Clinical Dietitian I Please reach out via secure chat

## 2023-09-05 NOTE — Anesthesia Postprocedure Evaluation (Signed)
 Anesthesia Post Note  Patient: James Ibarra  Procedure(s) Performed: ARTHROPLASTY, HIP, TOTAL, ANTERIOR APPROACH (Left: Hip)     Patient location during evaluation: PACU Anesthesia Type: General Level of consciousness: awake and alert Pain management: pain level controlled Vital Signs Assessment: post-procedure vital signs reviewed and stable Respiratory status: spontaneous breathing, nonlabored ventilation, respiratory function stable and patient connected to nasal cannula oxygen Cardiovascular status: blood pressure returned to baseline and stable Postop Assessment: no apparent nausea or vomiting Anesthetic complications: no   No notable events documented.  Last Vitals:  Vitals:   09/05/23 1631 09/05/23 1640  BP: (!) 160/107 (!) 179/93  Pulse: 73 70  Resp: (!) 22 11  Temp:    SpO2: (!) 85% 100%    Last Pain:  Vitals:   09/05/23 1318  TempSrc:   PainSc: 2                  [name] Like

## 2023-09-05 NOTE — Consult Note (Signed)
 Reason for Consult:Left hip fx Referring Physician: Owen Lore Time called: 0730 Time at bedside: 0853   James Ibarra is an 73 y.o. male.  HPI: Rolan fell at home, likely 2/2 inebriation. He had immediate left hip pain and could not get up. He was brought to the ED where x-rays showed a left hip fx and orthopedic surgery was consulted. He lives at home with his wife and does not use any assistive devices to ambulate.  Past Medical History:  Diagnosis Date   Atrial fibrillation (HCC)    Cancer (HCC)    Skin Cancer - Scalp, Right neck, Left Hand   Dysrhythmia    A. Fib while hospitalized June 2024   Heart murmur    History of nonmelanoma skin cancer    Hypercholesteremia    Hypertension    Pneumonia 08/2022   Trigeminal neuralgia     Past Surgical History:  Procedure Laterality Date   ATRIAL FIBRILLATION ABLATION N/A 05/24/2023   Procedure: ATRIAL FIBRILLATION ABLATION;  Surgeon: Nancey Eulas BRAVO, MD;  Location: [facility];  Service: Cardiovascular;  Laterality: N/A;   CATARACT EXTRACTION W/ INTRAOCULAR LENS IMPLANT Bilateral    CLIPPING OF ATRIAL APPENDAGE Left 12/08/2022   Procedure: CLIPPING OF ATRIAL APPENDAGE (LAA) USING 50 MM ATRICLIP;  Surgeon: Maryjane Mt, MD;  Location: [facility] OR;  Service: Open Heart Surgery;  Laterality: Left;   INGUINAL HERNIA REPAIR  11/07/2011   Procedure: LAPAROSCOPIC INGUINAL HERNIA;  Surgeon: Camellia CHRISTELLA Blush, MD,FACS;  Location: [facility];  Service: General;  Laterality: Left;   MAZE N/A 12/08/2022   Procedure: MAZE;  Surgeon: Maryjane Mt, MD;  Location: [facility] OR;  Service: Open Heart Surgery;  Laterality: N/A;   MITRAL VALVE REPAIR N/A 12/08/2022   Procedure: MITRAL VALVE REPAIR (MVR) USING 34 MM SIMULUS SEMI- RIGID ANNULOPLASTY BAND;  Surgeon: Maryjane Mt, MD;  Location: [facility];  Service: Open Heart Surgery;  Laterality: N/A;   RETINAL DETACHMENT SURGERY  03/07/1995   RIGHT/LEFT HEART CATH AND CORONARY ANGIOGRAPHY N/A 10/12/2022    Procedure: RIGHT/LEFT HEART CATH AND CORONARY ANGIOGRAPHY;  Surgeon: Wendel Lurena POUR, MD;  Location: [facility];  Service: Cardiovascular;  Laterality: N/A;   SKIN CANCER EXCISION  2012, 1999   basal cell - neck and hand   TEE WITHOUT CARDIOVERSION N/A 10/16/2022   Procedure: TRANSESOPHAGEAL ECHOCARDIOGRAM;  Surgeon: Santo Stanly LABOR, MD;  Location: [facility];  Service: Cardiovascular;  Laterality: N/A;   TEE WITHOUT CARDIOVERSION N/A 12/08/2022   Procedure: TRANSESOPHAGEAL ECHOCARDIOGRAM;  Surgeon: Maryjane Mt, MD;  Location: [facility] OR;  Service: Open Heart Surgery;  Laterality: N/A;    Family History  Problem Relation Age of Onset   Heart disease Father    Heart disease Mother     Social History:  reports that he has never smoked. He has never used smokeless tobacco. He reports that he does not currently use alcohol. He reports that he does not use drugs.  Allergies: No Known Allergies  Medications: I have reviewed the patient's current medications.  Results for orders placed or performed during the hospital encounter of 09/04/23 (from the past 48 hours)  Comprehensive metabolic panel     Status: Abnormal   Collection Time: 09/04/23  3:50 PM  Result Value Ref Range   Sodium 138 135 - 145 mmol/L   Potassium 3.2 (L) 3.5 - 5.1 mmol/L   Chloride 101 98 - 111 mmol/L   CO2 24 22 - 32 mmol/L  Glucose, Bld 127 (H) 70 - 99 mg/dL    Comment: Glucose reference range applies only to samples taken after fasting for at least 8 hours.   BUN 16 8 - 23 mg/dL   Creatinine, Ser 9.15 0.61 - 1.24 mg/dL   Calcium  8.9 8.9 - 10.3 mg/dL   Total Protein 6.6 6.5 - 8.1 g/dL   Albumin  3.8 3.5 - 5.0 g/dL   AST 46 (H) 15 - 41 U/L   ALT 26 0 - 44 U/L   Alkaline Phosphatase 62 38 - 126 U/L   Total Bilirubin 2.3 (H) 0.0 - 1.2 mg/dL   GFR, Estimated >39 >39 mL/min    Comment: (NOTE) Calculated using the CKD-EPI Creatinine Equation (2021)    Anion gap 13 5 - 15    Comment: Performed  at [facility], [address], [place], [place] [address]  CBC     Status: Abnormal   Collection Time: 09/04/23  3:50 PM  Result Value Ref Range   WBC 15.4 (H) 4.0 - 10.5 K/uL   RBC 4.69 4.22 - 5.81 MIL/uL   Hemoglobin 14.2 13.0 - 17.0 g/dL   HCT 58.6 60.9 - 47.9 %   MCV 88.1 80.0 - 100.0 fL   MCH 30.3 26.0 - 34.0 pg   MCHC 34.4 30.0 - 36.0 g/dL   RDW 85.8 88.4 - 84.4 %   Platelets 194 150 - 400 K/uL   nRBC 0.0 0.0 - 0.2 %    Comment: Performed at [facility], [address], [place], [place] [address]  Ethanol     Status: None   Collection Time: 09/04/23  3:50 PM  Result Value Ref Range   Alcohol, Ethyl (B) <15 <15 mg/dL    Comment: (NOTE) For medical purposes only. Performed at [facility], [address] [place], [place] [address]   Protime-INR     Status: None   Collection Time: 09/04/23  3:50 PM  Result Value Ref Range   Prothrombin Time 13.9 11.4 - 15.2 seconds   INR 1.0 0.8 - 1.2    Comment: (NOTE) INR goal varies based on device and disease states. Performed at [facility], [address], [place], [place] [address]   Sample to Blood Bank     Status: None   Collection Time: 09/04/23  3:50 PM  Result Value Ref Range   Blood Bank Specimen SAMPLE AVAILABLE FOR TESTING    Sample Expiration      09/07/2023,2359 Performed at [facility] Lab, [address] [place], [place] [address]   CK     Status: Abnormal   Collection Time: 09/04/23  3:50 PM  Result Value Ref Range   Total CK 780 (H) 49 - 397 U/L    Comment: Performed at [facility], [address] [place], [place] [address]  Magnesium      Status: None   Collection Time: 09/04/23  3:50 PM  Result Value Ref Range   Magnesium  2.0 1.7 - 2.4 mg/dL    Comment: Performed at [facility], [address] [place], [place] [address]  Type and screen [facility]     Status: None   Collection Time: 09/04/23  3:50 PM  Result Value Ref Range   ABO/RH(D) A  NEG    Antibody Screen NEG    Sample Expiration      09/07/2023,2359 Performed at [facility] Lab, [address] [place],  [place] [address]   I-Stat Chem 8, ED     Status: Abnormal   Collection Time: 09/04/23  4:04 PM  Result Value Ref Range   Sodium 141 135 - 145 mmol/L   Potassium 3.2 (L) 3.5 - 5.1 mmol/L   Chloride 103 98 - 111 mmol/L   BUN 18 8 - 23 mg/dL   Creatinine, Ser 9.29 0.61 - 1.24 mg/dL   Glucose, Bld 874 (H) 70 - 99 mg/dL    Comment: Glucose reference range applies only to samples taken after fasting for at least 8 hours.   Calcium , Ion 1.06 (L) 1.15 - 1.40 mmol/L   TCO2 27 22 - 32 mmol/L   Hemoglobin 14.6 13.0 - 17.0 g/dL   HCT 56.9 60.9 - 47.9 %  I-Stat Lactic Acid, ED     Status: Abnormal   Collection Time: 09/04/23  4:04 PM  Result Value Ref Range   Lactic Acid, Venous 3.0 (HH) 0.5 - 1.9 mmol/L   Comment NOTIFIED PHYSICIAN   Troponin I (High Sensitivity)     Status: Abnormal   Collection Time: 09/04/23  7:40 PM  Result Value Ref Range   Troponin I (High Sensitivity) 55 (H) <18 ng/L    Comment: (NOTE) Elevated high sensitivity troponin I (hsTnI) values and significant  changes across serial measurements may suggest ACS but many other  chronic and acute conditions are known to elevate hsTnI results.  Refer to the Links section for chest pain algorithms and additional  guidance. Performed at [facility] Lab, [address], [place], [place] [address]   Urinalysis, Routine w reflex microscopic -Urine, Clean Catch     Status: Abnormal   Collection Time: 09/04/23 11:33 PM  Result Value Ref Range   Color, Urine YELLOW YELLOW   APPearance CLEAR CLEAR   Specific Gravity, Urine 1.039 (H) 1.005 - 1.030   pH 6.0 5.0 - 8.0   Glucose, UA NEGATIVE NEGATIVE mg/dL   Hgb urine dipstick SMALL (A) NEGATIVE   Bilirubin Urine NEGATIVE NEGATIVE   Ketones, ur NEGATIVE NEGATIVE mg/dL   Protein, ur 899 (A) NEGATIVE mg/dL   Nitrite NEGATIVE NEGATIVE   Leukocytes,Ua  NEGATIVE NEGATIVE   RBC / HPF 11-20 0 - 5 RBC/hpf   WBC, UA 0-5 0 - 5 WBC/hpf   Bacteria, UA NONE SEEN NONE SEEN   Squamous Epithelial / HPF 0-5 0 - 5 /HPF    Comment: Performed at [facility], [address] [place], [place] [address]  Surgical PCR screen     Status: None   Collection Time: 09/05/23 12:13 AM   Specimen: Nasal Mucosa; Nasal Swab  Result Value Ref Range   MRSA, PCR NEGATIVE NEGATIVE   Staphylococcus aureus NEGATIVE NEGATIVE    Comment: (NOTE) The Xpert SA Assay (FDA approved for NASAL specimens in patients 54 years of age and older), is one component of a comprehensive surveillance program. It is not intended to diagnose infection nor to guide or monitor treatment. Performed at [facility] Lab, [address], [place], [place] [address]   CBC     Status: Abnormal   Collection Time: 09/05/23  5:35 AM  Result Value Ref Range   WBC 10.5 4.0 - 10.5 K/uL   RBC 4.11 (L) 4.22 - 5.81 MIL/uL   Hemoglobin 12.6 (L) 13.0 - 17.0 g/dL   HCT 63.5 (L) 60.9 - 47.9 %   MCV 88.6 80.0 - 100.0 fL   MCH 30.7 26.0 - 34.0 pg  MCHC 34.6 30.0 - 36.0 g/dL   RDW 85.7 88.4 - 84.4 %   Platelets 156 150 - 400 K/uL   nRBC 0.0 0.0 - 0.2 %    Comment: Performed at [facility], [address] [place], [place] [address]  Basic metabolic panel     Status: Abnormal   Collection Time: 09/05/23  5:35 AM  Result Value Ref Range   Sodium 137 135 - 145 mmol/L   Potassium 3.0 (L) 3.5 - 5.1 mmol/L   Chloride 98 98 - 111 mmol/L   CO2 29 22 - 32 mmol/L   Glucose, Bld 117 (H) 70 - 99 mg/dL    Comment: Glucose reference range applies only to samples taken after fasting for at least 8 hours.   BUN 13 8 - 23 mg/dL   Creatinine, Ser 9.26 0.61 - 1.24 mg/dL   Calcium  8.5 (L) 8.9 - 10.3 mg/dL   GFR, Estimated >39 >39 mL/min    Comment: (NOTE) Calculated using the CKD-EPI Creatinine Equation (2021)    Anion gap 10 5 - 15    Comment: Performed at [facility], [address] [place], [place] [address]    MR BRAIN WO CONTRAST Result Date: 09/04/2023 CLINICAL DATA:  Syncope, presyncope. EXAM: MRI HEAD WITHOUT CONTRAST TECHNIQUE: Multiplanar, multiecho pulse sequences of the brain and surrounding structures were obtained without intravenous contrast. COMPARISON:  Same day CT head. FINDINGS: Brain: Punctate focus of restricted diffusion in the left hippocampus (series 5, image 72). No acute hemorrhage, hydrocephalus, extra-axial collection or mass lesion. Cerebral atrophy. 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 Vascular: Major arterial flow voids are maintained skull base. Skull and upper cervical spine: Normal marrow signal. Sinuses/Orbits: Clear sinuses.  Neck overall findings. IMPRESSION: Punctate focus of restricted diffusion in the left hippocampus, compatible with transient global amnesia versus acute infarct. Electronically Signed   By: Gilmore GORMAN Molt M.D.   On: 09/04/2023 23:43   DG Ankle 2 Views Right Result Date: 09/04/2023 CLINICAL DATA:  Pain after fall at home, initial encounter. EXAM: RIGHT ANKLE - 2 VIEW COMPARISON:  None Available. FINDINGS: Lateral view is limited by positioning. Allowing for this, no evidence of acute fracture. No dislocation. The ankle mortise is preserved on AP view. Cannot assess for joint effusion. No focal soft tissue abnormalities. IMPRESSION: No acute fracture or dislocation of the right ankle. Electronically Signed   By: Andrea Gasman M.D.   On: 09/04/2023 20:35   CT HEAD WO CONTRAST Result Date: 09/04/2023 CLINICAL DATA:  Provided history: Head trauma, moderate/severe. Polytrauma, blunt. EXAM: CT HEAD WITHOUT CONTRAST CT CERVICAL SPINE WITHOUT CONTRAST TECHNIQUE: Multidetector CT imaging of the head and cervical spine was performed following the standard protocol without intravenous contrast. Multiplanar CT image reconstructions of the cervical spine were  also generated. RADIATION DOSE REDUCTION: This exam was performed according to the departmental dose-optimization program which includes automated exposure control, adjustment of the mA and/or kV according to patient size and/or use of iterative reconstruction technique. COMPARISON:  None. FINDINGS: CT HEAD FINDINGS Brain: Generalized cerebral atrophy. Prominence of the ventricles and sulci, which appears commensurate. Patchy and ill-defined hypoattenuation within the cerebral white matter, nonspecific but compatible with mild chronic small vessel ischemic disease. Mega cisterna magna (anatomic variant). There is no acute intracranial hemorrhage. No demarcated cortical infarct. No extra-axial fluid collection. No evidence of an intracranial mass. No midline shift.  a Vascular: No hyperdense vessel.  Atherosclerotic calcifications. Skull: No calvarial fracture or aggressive osseous lesion. Sinuses/Orbits: No mass or acute finding within the imaged orbits. Left scleral buckle. Prior bilateral ocular lens replacement. No significant paranasal sinus disease. CT CERVICAL SPINE FINDINGS Alignment: Dextrocurvature of the cervical spine. Levocurvature of the visible upper thoracic spine. 2 mm C7-T1 grade 1 anterolisthesis. Skull base and vertebrae: The basion-dental and atlanto-dental intervals are maintained.No evidence of acute fracture to the cervical spine. Soft tissues and spinal canal: No prevertebral fluid or swelling. No visible canal hematoma. Disc levels: Cervical spondylosis with multilevel disc space narrowing, disc bulges/central disc protrusions, endplate spurring and uncovertebral hypertrophy. Facet arthropathy on the left at C7-T1. No appreciable high-grade spinal canal stenosis. Multilevel bony neural foraminal narrowing, greatest on the left at C3-C4, C4-C5 and C5-C6. Degenerative changes also present at the C1-C2 articulation. Upper chest: No consolidation within the imaged lung apices. No visible  pneumothorax. IMPRESSION: CT head: 1. No evidence of an acute intracranial abnormality. 2. Parenchymal atrophy and chronic small vessel ischemic disease. CT cervical spine: 1. No evidence of an acute cervical spine fracture. 2. 2 mm C7-T1 grade 1 anterolisthesis. 3. Dextrocurvature of the cervical spine. 4. Cervical spondylosis as described. Electronically Signed   By: Rockey Childs D.O.   On: 09/04/2023 17:01   CT CERVICAL SPINE WO CONTRAST Result Date: 09/04/2023 CLINICAL DATA:  Provided history: Head trauma, moderate/severe. Polytrauma, blunt. EXAM: CT HEAD WITHOUT CONTRAST CT CERVICAL SPINE WITHOUT CONTRAST TECHNIQUE: Multidetector CT imaging of the head and cervical spine was performed following the standard protocol without intravenous contrast. Multiplanar CT image reconstructions of the cervical spine were also generated. RADIATION DOSE REDUCTION: This exam was performed according to the departmental dose-optimization program which includes automated exposure control, adjustment of the mA and/or kV according to patient size and/or use of iterative reconstruction technique. COMPARISON:  None. FINDINGS: CT HEAD FINDINGS Brain: Generalized cerebral atrophy. Prominence of the ventricles and sulci, which appears commensurate. Patchy and ill-defined hypoattenuation within the cerebral white matter, nonspecific but compatible with mild chronic small vessel ischemic disease. Mega cisterna magna (anatomic variant). There is no acute intracranial hemorrhage. No demarcated cortical infarct. No extra-axial fluid collection. No evidence of an intracranial mass. No midline shift.  a Vascular: No hyperdense vessel.  Atherosclerotic calcifications. Skull: No calvarial fracture or aggressive osseous lesion. Sinuses/Orbits: No mass or acute finding within the imaged orbits. Left scleral buckle. Prior bilateral ocular lens replacement. No significant paranasal sinus disease. CT CERVICAL SPINE FINDINGS Alignment:  Dextrocurvature of the cervical spine. Levocurvature of the visible upper thoracic spine. 2 mm C7-T1 grade 1 anterolisthesis. Skull base and vertebrae: The basion-dental and atlanto-dental intervals are maintained.No evidence of acute fracture to the cervical spine. Soft tissues and spinal canal: No prevertebral fluid or swelling. No visible canal hematoma. Disc levels: Cervical spondylosis with multilevel disc space narrowing, disc bulges/central disc protrusions, endplate spurring and uncovertebral hypertrophy. Facet arthropathy on the left at C7-T1. No appreciable high-grade spinal canal stenosis. Multilevel bony neural foraminal narrowing, greatest on the left at C3-C4, C4-C5 and C5-C6. Degenerative changes also present at the C1-C2 articulation. Upper chest: No consolidation within the imaged lung apices. No visible pneumothorax. IMPRESSION: CT head: 1. No evidence of an acute intracranial abnormality. 2. Parenchymal atrophy and chronic small vessel ischemic disease. CT cervical spine: 1. No evidence of an acute cervical spine fracture. 2. 2 mm C7-T1 grade 1 anterolisthesis. 3. Dextrocurvature of the cervical spine. 4. Cervical spondylosis as described. Electronically Signed   By: Rockey Childs  D.O.   On: 09/04/2023 17:01   CT CHEST ABDOMEN PELVIS W CONTRAST Result Date: 09/04/2023 CLINICAL DATA:  Found down, fell, hip injury EXAM: CT CHEST, ABDOMEN, AND PELVIS WITH CONTRAST TECHNIQUE: Multidetector CT imaging of the chest, abdomen and pelvis was performed following the standard protocol during bolus administration of intravenous contrast. RADIATION DOSE REDUCTION: This exam was performed according to the departmental dose-optimization program which includes automated exposure control, adjustment of the mA and/or kV according to patient size and/or use of iterative reconstruction technique. CONTRAST:  75mL OMNIPAQUE  IOHEXOL  350 MG/ML SOLN COMPARISON:  09/04/2023, 05/03/2023 FINDINGS: CT CHEST FINDINGS  Cardiovascular: Postsurgical changes from median sternotomy and mitral valve replacement. Surgical clip across the left atrial appendage. Heart is otherwise unremarkable without pericardial effusion. No evidence of vascular injury. No evidence of thoracic aortic aneurysm or dissection. Atherosclerosis of the aortic arch. Mediastinum/Nodes: No enlarged mediastinal, hilar, or axillary lymph nodes. Thyroid  gland, trachea, and esophagus demonstrate no significant findings. Lungs/Pleura: No acute airspace disease, effusion, or pneumothorax. Minimal hypoventilatory changes or subpleural scarring within the dependent lower lobes. Central airways are patent. Musculoskeletal: Prior median sternotomy. No acute or destructive bony abnormalities. Reconstructed images demonstrate no additional findings. CT ABDOMEN PELVIS FINDINGS Hepatobiliary: No hepatic injury or perihepatic hematoma. Gallbladder is unremarkable. Pancreas: Unremarkable. No pancreatic ductal dilatation or surrounding inflammatory changes. Spleen: No splenic injury or perisplenic hematoma. Adrenals/Urinary Tract: No adrenal hemorrhage or renal injury identified. Bladder is unremarkable. Stomach/Bowel: No bowel obstruction or ileus. Normal appendix right lower quadrant. Diverticulosis of the descending and sigmoid colon without diverticulitis. No bowel wall thickening or inflammatory change. Vascular/Lymphatic: Aortic atherosclerosis. No enlarged abdominal or pelvic lymph nodes. Reproductive: Prostate is unremarkable. Other: No free fluid or free intraperitoneal gas. No abdominal wall hernia. Musculoskeletal: Comminuted impacted subcapital left femoral neck fracture is identified, with ventral and varus angulation at the fracture site. No dislocation. No other acute displaced fractures. Moderate soft tissue swelling surrounding the left hip fracture. Reconstructed images demonstrate no additional findings. IMPRESSION: 1. Comminuted subcapital left femoral neck  fracture. 2. Otherwise no acute intrathoracic, intra-abdominal, or intrapelvic trauma. 3.  Aortic Atherosclerosis (ICD10-I70.0). 4. Distal colonic diverticulosis without diverticulitis. Electronically Signed   By: Ozell Daring M.D.   On: 09/04/2023 16:49   DG Pelvis Portable Result Date: 09/04/2023 CLINICAL DATA:  Trauma fall EXAM: PORTABLE PELVIS 1-2 VIEWS COMPARISON:  None Available. FINDINGS: SI joints are non widened. Pubic symphysis and rami appear intact. Acute displaced left femoral neck fracture. No femoral head dislocation IMPRESSION: Acute displaced left femoral neck fracture. Electronically Signed   By: Luke Bun M.D.   On: 09/04/2023 15:58   DG Chest Port 1 View Result Date: 09/04/2023 CLINICAL DATA:  Trauma fall EXAM: PORTABLE CHEST 1 VIEW COMPARISON:  12/26/2022 FINDINGS: Post sternotomy changes with valve prosthesis and atrial appendage clip. Hyperinflated lungs. No focal opacity, pleural effusion, or pneumothorax. IMPRESSION: No active disease. Hyperinflated lungs. Electronically Signed   By: Luke Bun M.D.   On: 09/04/2023 15:57    Review of Systems  HENT:  Negative for ear discharge, ear pain, hearing loss and tinnitus.   Eyes:  Negative for photophobia and pain.  Respiratory:  Negative for cough and shortness of breath.   Cardiovascular:  Negative for chest pain.  Gastrointestinal:  Negative for abdominal pain, nausea and vomiting.  Genitourinary:  Negative for dysuria, flank pain, frequency and urgency.  Musculoskeletal:  Positive for arthralgias (Left hip). Negative for back pain, myalgias and neck pain.  Neurological:  Negative  for dizziness and headaches.  Hematological:  Does not bruise/bleed easily.  Psychiatric/Behavioral:  The patient is not nervous/anxious.    Blood pressure (!) 171/94, pulse 65, temperature 98.2 F (36.8 C), resp. rate 16, height 6' 1 (1.854 m), weight 73 kg, SpO2 95%. Physical Exam Constitutional:      General: He is not in acute  distress.    Appearance: He is well-developed. He is not diaphoretic.  HENT:     Head: Normocephalic and atraumatic.  Eyes:     General: No scleral icterus.       Right eye: No discharge.        Left eye: No discharge.     Conjunctiva/sclera: Conjunctivae normal.  Cardiovascular:     Rate and Rhythm: Normal rate and regular rhythm.  Pulmonary:     Effort: Pulmonary effort is normal. No respiratory distress.  Musculoskeletal:     Cervical back: Normal range of motion.     Comments: LLE No traumatic wounds, ecchymosis, or rash  Mod TTP hip  No knee or ankle effusion  Knee stable to varus/ valgus and anterior/posterior stress  Sens DPN, SPN, TN intact  Motor EHL, ext, flex, evers 5/5  DP 2+, PT 0, No significant edema  Skin:    General: Skin is warm and dry.  Neurological:     Mental Status: He is alert.  Psychiatric:        Mood and Affect: Mood normal.        Behavior: Behavior normal.     Assessment/Plan: Left hip fx -- Plan THA today with Dr. Jerri. Please keep NPO. Multiple medical problems including MR s/p repair, HTN, HLD, CAD, and atrial fibrillation -- per primary service    Ozell DOROTHA Ned, PA-C Orthopedic Surgery [phone] 09/05/2023, 9:14 AM

## 2023-09-05 NOTE — Anesthesia Preprocedure Evaluation (Addendum)
 Anesthesia Evaluation  Patient identified by MRN, date of birth, ID band Patient awake    Reviewed: Allergy & Precautions, NPO status , Patient's Chart, lab work & pertinent test results  Airway Mallampati: III  TM Distance: >3 FB Neck ROM: Full    Dental  (+) Teeth Intact, Dental Advisory Given   Pulmonary    breath sounds clear to auscultation       Cardiovascular hypertension, + dysrhythmias + Valvular Problems/Murmurs  Rhythm:Regular Rate:Normal  - s/p MVR, MAZE  - Echo: 1. Left ventricular ejection fraction, by estimation, is 55 to 60%. The  left ventricle has normal function. The left ventricle has no regional  wall motion abnormalities. There is mild asymmetric left ventricular  hypertrophy of the basal-septal segment.  Left ventricular diastolic parameters are consistent with Grade II  diastolic dysfunction (pseudonormalization).   2. Right ventricular systolic function is mildly reduced. The right  ventricular size is mildly enlarged. There is normal pulmonary artery  systolic pressure. The estimated right ventricular systolic pressure is  33.0 mmHg.   3. Left atrial size was moderately dilated.   4. S/p mitral valve repair. Mean gradient 3 mmHg, no significant  stenosis. No significant mitral regurgitation.   5. The aortic valve is tricuspid. There is mild calcification of the  aortic valve. Aortic valve regurgitation is not visualized. No aortic  stenosis is present.   6. The inferior vena cava is dilated in size with >50% respiratory  variability, suggesting right atrial pressure of 8 mmHg.     Neuro/Psych  negative psych ROS   GI/Hepatic negative GI ROS, Neg liver ROS,,,  Endo/Other  negative endocrine ROS    Renal/GU negative Renal ROS     Musculoskeletal negative musculoskeletal ROS (+)    Abdominal   Peds  Hematology negative hematology ROS (+)   Anesthesia Other Findings    Reproductive/Obstetrics                              Anesthesia Physical Anesthesia Plan  ASA: 3  Anesthesia Plan: General   Post-op Pain Management: Tylenol  PO (pre-op)*   Induction: Intravenous  PONV Risk Score and Plan: 3 and Ondansetron , Dexamethasone  and Treatment may vary due to age or medical condition  Airway Management Planned: Oral ETT  Additional Equipment: None  Intra-op Plan:   Post-operative Plan: Extubation in OR  Informed Consent: I have reviewed the patients History and Physical, chart, labs and discussed the procedure including the risks, benefits and alternatives for the proposed anesthesia with the patient or authorized representative who has indicated his/her understanding and acceptance.     Dental advisory given  Plan Discussed with: CRNA  Anesthesia Plan Comments:         Anesthesia Quick Evaluation

## 2023-09-05 NOTE — Discharge Instructions (Addendum)
 INSTRUCTIONS AFTER JOINT REPLACEMENT   Remove items at home which could result in a fall. This includes throw rugs or furniture in walking pathways ICE to the affected joint every three hours while awake for 30 minutes at a time, for at least the first 3-5 days, and then as needed for pain and swelling.  Continue to use ice for pain and swelling. You may notice swelling that will progress down to the foot and ankle.  This is normal after surgery.  Elevate your leg when you are not up walking on it.   Continue to use the breathing machine you got in the hospital (incentive spirometer) which will help keep your temperature down.  It is common for your temperature to cycle up and down following surgery, especially at night when you are not up moving around and exerting yourself.  The breathing machine keeps your lungs expanded and your temperature down.   DIET:  As you were doing prior to hospitalization, we recommend a well-balanced diet.  DRESSING / WOUND CARE / SHOWERING  Keep the surgical dressing until follow up.  The dressing is water  proof, so you can shower without any extra covering.  IF THE DRESSING FALLS OFF or the wound gets wet inside, change the dressing with sterile gauze.  Please use good hand washing techniques before changing the dressing.  Do not use any lotions or creams on the incision until instructed by your surgeon.    ACTIVITY  Increase activity slowly as tolerated, but follow the weight bearing instructions below.   No driving for 6 weeks or until further direction given by your physician.  You cannot drive while taking narcotics.  No lifting or carrying greater than 10 lbs. until further directed by your surgeon. Avoid periods of inactivity such as sitting longer than an hour when not asleep. This helps prevent blood clots.  You may return to work once you are authorized by your doctor.     WEIGHT BEARING   Weight bearing as tolerated with assist device (walker, cane,  etc) as directed, use it as long as suggested by your surgeon or therapist, typically at least 4-6 weeks.   EXERCISES  Results after joint replacement surgery are often greatly improved when you follow the exercise, range of motion and muscle strengthening exercises prescribed by your doctor. Safety measures are also important to protect the joint from further injury. Any time any of these exercises cause you to have increased pain or swelling, decrease what you are doing until you are comfortable again and then slowly increase them. If you have problems or questions, call your caregiver or physical therapist for advice.   Rehabilitation is important following a joint replacement. After just a few days of immobilization, the muscles of the leg can become weakened and shrink (atrophy).  These exercises are designed to build up the tone and strength of the thigh and leg muscles and to improve motion. Often times heat used for twenty to thirty minutes before working out will loosen up your tissues and help with improving the range of motion but do not use heat for the first two weeks following surgery (sometimes heat can increase post-operative swelling).   These exercises can be done on a training (exercise) mat, on the floor, on a table or on a bed. Use whatever works the best and is most comfortable for you.    Use music or television while you are exercising so that the exercises are a pleasant break in your  day. This will make your life better with the exercises acting as a break in your routine that you can look forward to.   Perform all exercises about fifteen times, three times per day or as directed.  You should exercise both the operative leg and the other leg as well.  Exercises include:   Quad Sets - Tighten up the muscle on the front of the thigh (Quad) and hold for 5-10 seconds.   Straight Leg Raises - With your knee straight (if you were given a brace, keep it on), lift the leg to 60  degrees, hold for 3 seconds, and slowly lower the leg.  Perform this exercise against resistance later as your leg gets stronger.  Leg Slides: Lying on your back, slowly slide your foot toward your buttocks, bending your knee up off the floor (only go as far as is comfortable). Then slowly slide your foot back down until your leg is flat on the floor again.  Angel Wings: Lying on your back spread your legs to the side as far apart as you can without causing discomfort.  Hamstring Strength:  Lying on your back, push your heel against the floor with your leg straight by tightening up the muscles of your buttocks.  Repeat, but this time bend your knee to a comfortable angle, and push your heel against the floor.  You may put a pillow under the heel to make it more comfortable if necessary.   A rehabilitation program following joint replacement surgery can speed recovery and prevent re-injury in the future due to weakened muscles. Contact your doctor or a physical therapist for more information on knee rehabilitation.    CONSTIPATION  Constipation is defined medically as fewer than three stools per week and severe constipation as less than one stool per week.  Even if you have a regular bowel pattern at home, your normal regimen is likely to be disrupted due to multiple reasons following surgery.  Combination of anesthesia, postoperative narcotics, change in appetite and fluid intake all can affect your bowels.   YOU MUST use at least one of the following options; they are listed in order of increasing strength to get the job done.  They are all available over the counter, and you may need to use some, POSSIBLY even all of these options:    Drink plenty of fluids (prune juice may be helpful) and high fiber foods Colace 100 mg by mouth twice a day  Senokot for constipation as directed and as needed Dulcolax (bisacodyl ), take with full glass of water   Miralax  (polyethylene glycol) once or twice a day as  needed.  If you have tried all these things and are unable to have a bowel movement in the first 3-4 days after surgery call either your surgeon or your primary doctor.    If you experience loose stools or diarrhea, hold the medications until you stool forms back up.  If your symptoms do not get better within 1 week or if they get worse, check with your doctor.  If you experience the worst abdominal pain ever or develop nausea or vomiting, please contact the office immediately for further recommendations for treatment.   ITCHING:  If you experience itching with your medications, try taking only a single pain pill, or even half a pain pill at a time.  You can also use Benadryl over the counter for itching or also to help with sleep.   TED HOSE STOCKINGS:  Use stockings on both  legs until for at least 2 weeks or as directed by physician office. They may be removed at night for sleeping.  MEDICATIONS:  See your medication summary on the "After Visit Summary" that nursing will review with you.  You may have some home medications which will be placed on hold until you complete the course of blood thinner medication.  It is important for you to complete the blood thinner medication as prescribed.  PRECAUTIONS:  If you experience chest pain or shortness of breath - call 911 immediately for transfer to the hospital emergency department.   If you develop a fever greater that 101 F, purulent drainage from wound, increased redness or drainage from wound, foul odor from the wound/dressing, or calf pain - CONTACT YOUR SURGEON.                                                   FOLLOW-UP APPOINTMENTS:  If you do not already have a post-op appointment, please call the office for an appointment to be seen by your surgeon.  Guidelines for how soon to be seen are listed in your "After Visit Summary", but are typically between 1-4 weeks after surgery.  OTHER INSTRUCTIONS:   Knee Replacement:  Do not place pillow  under knee, focus on keeping the knee straight while resting. CPM instructions: 0-90 degrees, 2 hours in the morning, 2 hours in the afternoon, and 2 hours in the evening. Place foam block, curve side up under heel at all times except when in CPM or when walking.  DO NOT modify, tear, cut, or change the foam block in any way.  POST-OPERATIVE OPIOID TAPER INSTRUCTIONS: It is important to wean off of your opioid medication as soon as possible. If you do not need pain medication after your surgery it is ok to stop day one. Opioids include: Codeine, Hydrocodone (Norco, Vicodin), Oxycodone (Percocet, oxycontin ) and hydromorphone  amongst others.  Long term and even short term use of opiods can cause: Increased pain response Dependence Constipation Depression Respiratory depression And more.  Withdrawal symptoms can include Flu like symptoms Nausea, vomiting And more Techniques to manage these symptoms Hydrate well Eat regular healthy meals Stay active Use relaxation techniques(deep breathing, meditating, yoga) Do Not substitute Alcohol to help with tapering If you have been on opioids for less than two weeks and do not have pain than it is ok to stop all together.  Plan to wean off of opioids This plan should start within one week post op of your joint replacement. Maintain the same interval or time between taking each dose and first decrease the dose.  Cut the total daily intake of opioids by one tablet each day Next start to increase the time between doses. The last dose that should be eliminated is the evening dose.   Per [facility] clinic policy, our goal is ensure optimal postoperative pain control with a multimodal pain management strategy. For all [facility] patients, our goal is to wean post-operative narcotic medications by 6 weeks post-operatively. If this is not possible due to utilization of pain medication prior to surgery, your [facility] doctor will support your acute  post-operative pain control for the first 6 weeks postoperatively, with a plan to transition you back to your primary pain team following that. [facility] will work to ensure a Therapist, occupational.   MAKE SURE YOU:  Understand these  instructions.  Get help right away if you are not doing well or get worse.    Thank you for letting us  be a part of your medical care team.  It is a privilege we respect greatly.  We hope these instructions will help you stay on track for a fast and full recovery!       Nutrition Post Hospital Stay Proper nutrition can help your body recover from illness and injury.   Foods and beverages high in protein, vitamins, and minerals help rebuild muscle loss, promote healing, & reduce fall risk.   In addition to eating healthy foods, a nutrition shake is an easy, delicious way to get the nutrition you need during and after your hospital stay  It is recommended that you continue to drink 2 bottles per day of:       Ensure/Boost for at least 1 month (30 days) after your hospital stay   Tips for adding a nutrition shake into your routine: As allowed, drink one with vitamins or medications instead of water  or juice Enjoy one as a tasty mid-morning or afternoon snack Drink cold or make a milkshake out of it Drink one instead of milk with cereal or snacks Use as a coffee creamer   Available at the following grocery stores and pharmacies:           * [place] [facility] * [employer] * [employer]  * [employer]          * [employer] * [employer]  * [facility]      * [employer]  * [employer]   * [employer]  * [employer]   * [facility] [phone]            For COUPONS visit: www.[email or web address]/join or RoleLink.com.br   Suggested Substitutions Ensure Plus = Boost Plus = Carnation Breakfast Essentials = Boost Compact Ensure Active Clear = Boost Breeze Glucerna Shake = Boost Glucose Control = Carnation Breakfast Essentials SUGAR FREE

## 2023-09-05 NOTE — Consult Note (Addendum)
 Cardiology Consultation   Patient ID: James Ibarra MRN: [id number]; DOB: [birth year]  Admit date: 09/04/2023 Date of Consult: 09/05/2023  PCP:  Kip Righter, MD    [facility] Providers Cardiologist:  Lurena MARLA Red, MD Electrophysiologist:  Eulas FORBES Furbish, MD     Patient Profile: James Ibarra is a 73 y.o. 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  History of Present Illness: James Ibarra has history of MVR repair in 2024.  Precath workup for this demonstrated very minimal luminal abnormalities without any evidence of CAD.  This procedure was performed December 08, 2022.  During admission noted to have postoperative atrial fibrillation.  He had converted off of IV amiodarone  but follow-up heart monitor November 2024 showed 12% atrial fibrillation burden.  Later on had CT scan prior to his ablation that demonstrated a CAC score of 205. He then underwent atrial fibrillation ablation 05/2023.  Follow-up echocardiogram November 2024 demonstrated stable valve function.  He has been seen recently last month both by EP and primary cardiologist and overall doing well.  EP had stopped his Eliquis .  Currently patient being admitted for fall with concerns that it might have been driven by inebriation.  Patient reported that he tripped.  X-ray showed left hip fracture, now has plans for THA, cardiology asked to preoperatively evaluate.  Patient reports that he has had absolutely no complaints.  Denying any symptoms of chest pain, shortness of breath, fatigue, lightheadedness, syncope, peripheral edema, orthopnea.  Prior to his fall was very functional, walking his dog almost on a daily basis.  Able to ambulate at least  40 minutes at a time.  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  Potassium has been persistently low, 3.0 today.  Troponins 55-50.  Hemoglobin 12.6.  Past Medical History:  Diagnosis Date   Atrial fibrillation (HCC)    Cancer (HCC)    Skin Cancer - Scalp, Right neck, Left Hand   Dysrhythmia    A. Fib while hospitalized June 2024   Heart murmur    History of nonmelanoma skin cancer    Hypercholesteremia    Hypertension    Pneumonia 08/2022   Trigeminal neuralgia     Past Surgical History:  Procedure Laterality Date   ATRIAL FIBRILLATION ABLATION N/A 05/24/2023   Procedure: ATRIAL FIBRILLATION ABLATION;  Surgeon: Furbish Eulas FORBES, MD;  Location: [facility];  Service: Cardiovascular;  Laterality: N/A;   CATARACT EXTRACTION W/ INTRAOCULAR LENS IMPLANT Bilateral    CLIPPING OF ATRIAL APPENDAGE Left 12/08/2022   Procedure: CLIPPING OF ATRIAL APPENDAGE (LAA) USING 50 MM ATRICLIP;  Surgeon: Maryjane Mt, MD;  Location: [facility] OR;  Service: Open Heart Surgery;  Laterality: Left;   INGUINAL HERNIA REPAIR  11/07/2011   Procedure: LAPAROSCOPIC INGUINAL HERNIA;  Surgeon: Camellia CHRISTELLA Blush, MD,FACS;  Location: [facility];  Service: General;  Laterality: Left;   MAZE N/A 12/08/2022   Procedure: MAZE;  Surgeon: Maryjane Mt, MD;  Location: [facility] OR;  Service: Open Heart Surgery;  Laterality: N/A;   MITRAL VALVE REPAIR N/A 12/08/2022   Procedure: MITRAL VALVE REPAIR (MVR) USING 34 MM SIMULUS SEMI- RIGID ANNULOPLASTY BAND;  Surgeon: Maryjane Mt, MD;  Location: [facility] OR;  Service: Open Heart Surgery;  Laterality: N/A;   RETINAL DETACHMENT SURGERY  03/07/1995  RIGHT/LEFT HEART CATH AND CORONARY ANGIOGRAPHY N/A 10/12/2022   Procedure: RIGHT/LEFT HEART CATH AND CORONARY ANGIOGRAPHY;  Surgeon: Wendel Lurena POUR, MD;  Location: [facility];  Service: Cardiovascular;  Laterality: N/A;   SKIN CANCER EXCISION  2012, 1999   basal cell - neck and hand   TEE WITHOUT CARDIOVERSION N/A 10/16/2022   Procedure:  TRANSESOPHAGEAL ECHOCARDIOGRAM;  Surgeon: Santo Stanly LABOR, MD;  Location: [facility];  Service: Cardiovascular;  Laterality: N/A;   TEE WITHOUT CARDIOVERSION N/A 12/08/2022   Procedure: TRANSESOPHAGEAL ECHOCARDIOGRAM;  Surgeon: Maryjane Mt, MD;  Location: [facility] OR;  Service: Open Heart Surgery;  Laterality: N/A;    Scheduled Meds:  irbesartan   150 mg Oral Daily   metoprolol  tartrate  50 mg Oral BID   multivitamin  1 tablet Oral BID   multivitamin with minerals  1 tablet Oral Daily   mupirocin ointment  1 Application Nasal BID   Continuous Infusions:  potassium chloride  10 mEq (09/05/23 1016)   PRN Meds: bisacodyl , hydrALAZINE , HYDROcodone -acetaminophen , LORazepam  **OR** LORazepam , methocarbamol **OR** methocarbamol (ROBAXIN) injection, morphine  injection, polyethylene glycol  Allergies:   No Known Allergies  Social History:   Social History   Socioeconomic History   Marital status: Married    Spouse name: Not on file   Number of children: Not on file   Years of education: Not on file   Highest education level: Not on file  Occupational History   Not on file  Tobacco Use   Smoking status: Never   Smokeless tobacco: Never   Tobacco comments:    Never smoked 06/21/23  Vaping Use   Vaping status: Never Used  Substance and Sexual Activity   Alcohol use: Not Currently   Drug use: No   Sexual activity: Not on file  Other Topics Concern   Not on file  Social History Narrative   Not on file   Social Drivers of Health   Financial Resource Strain: Not on file  Food Insecurity: No Food Insecurity (08/12/2022)   Hunger Vital Sign    Worried About Running Out of Food in the Last Year: Never true    Ran Out of Food in the Last Year: Never true  Transportation Needs: No Transportation Needs (08/12/2022)   PRAPARE - Administrator, Civil Service (Medical): No    Lack of Transportation (Non-Medical): No  Physical Activity: Not on file  Stress: Not on file   Social Connections: Not on file  Intimate Partner Violence: Not At Risk (08/12/2022)   Humiliation, Afraid, Rape, and Kick questionnaire    Fear of Current or Ex-Partner: No    Emotionally Abused: No    Physically Abused: No    Sexually Abused: No    Family History:   Family History  Problem Relation Age of Onset   Heart disease Father    Heart disease Mother      ROS:  Please see the history of present illness.  All other ROS reviewed and negative.     Physical Exam/Data: Vitals:   09/04/23 2115 09/05/23 0007 09/05/23 0422 09/05/23 0744  BP: (!) 162/94 (!) 169/100 (!) 164/92 (!) 171/94  Pulse: 88 78 65 65  Resp: (!) 25 18 18 16   Temp:  98.5 F (36.9 C) (!) 97.4 F (36.3 C) 98.2 F (36.8 C)  TempSrc:  Oral Oral   SpO2: 99% 94% 95% 95%  Weight:      Height:        Intake/Output Summary (  Last 24 hours) at 09/05/2023 1116 Last data filed at 09/05/2023 0827 Gross per 24 hour  Intake 2000 ml  Output 500 ml  Net 1500 ml      09/04/2023    3:16 PM 08/30/2023    9:47 AM 08/30/2023    8:48 AM  Last 3 Weights  Weight (lbs) 160 lb 15 oz 161 lb 12.8 oz 157 lb  Weight (kg) 73 kg 73.392 kg 71.215 kg     Body mass index is 21.23 kg/m.  General:  Well nourished, well developed, in no acute distress HEENT: normal Neck: no JVD Vascular: No carotid bruits; Distal pulses 2+ bilaterally Cardiac:  normal S1, S2; RRR; no murmur  Lungs:  clear to auscultation bilaterally, no wheezing, rhonchi or rales  Abd: soft, nontender, no hepatomegaly  Ext: no edema Musculoskeletal:  No deformities, BUE and BLE strength normal and equal Skin: warm and dry  Neuro:  CNs 2-12 intact, no focal abnormalities noted Psych:  Normal affect   EKG:  The EKG was personally reviewed and demonstrates: Sinus rhythm heart rate 89.  Very minimal ST depressions noted anterior laterally Telemetry:  Telemetry was personally reviewed and demonstrates: Sinus rhythm, PACs PVCs.  Relevant CV Studies: Right left  heart catheterization 10/12/2022 1.  Very minimal luminal abnormalities of LAD with otherwise normal right dominant circulation. 2.  Fick cardiac output of 4.9 L/min and Fick cardiac index of 2.6 L/min/m with the following hemodynamics:             Right atrial pressure mean of 9 mmHg             Right ventricular pressure 36/5 with an end-diastolic pressure of 18 mmHg             Mean wedge pressure of 18 mmHg with V waves to 25 mmHg             PA pressure of 38/16 with a mean of 23 mmHg             PVR of less than 3 Woods units 3.  LVEDP of 26 mmHg.   Recommendation: Surgical opinion for degenerative mitral regurgitation and atrial fibrillation.  Echocardiogram 01/19/2023 1. Left ventricular ejection fraction, by estimation, is 55 to 60%. The  left ventricle has normal function. The left ventricle has no regional  wall motion abnormalities. There is mild asymmetric left ventricular  hypertrophy of the basal-septal segment.  Left ventricular diastolic parameters are consistent with Grade II  diastolic dysfunction (pseudonormalization).   2. Right ventricular systolic function is mildly reduced. The right  ventricular size is mildly enlarged. There is normal pulmonary artery  systolic pressure. The estimated right ventricular systolic pressure is  33.0 mmHg.   3. Left atrial size was moderately dilated.   4. S/p mitral valve repair. Mean gradient 3 mmHg, no significant  stenosis. No significant mitral regurgitation.   5. The aortic valve is tricuspid. There is mild calcification of the  aortic valve. Aortic valve regurgitation is not visualized. No aortic  stenosis is present.   6. The inferior vena cava is dilated in size with >50% respiratory  variability, suggesting right atrial pressure of 8 mmHg.     Laboratory Data: High Sensitivity Troponin:   Recent Labs  Lab 09/04/23 1940 09/05/23 0753  TROPONINIHS 55* 50*     Chemistry Recent Labs  Lab 09/04/23 1550  09/04/23 1604 09/05/23 0535  NA 138 141 137  K 3.2* 3.2* 3.0*  CL 101 103  98  CO2 24  --  29  GLUCOSE 127* 125* 117*  BUN 16 18 13   CREATININE 0.84 0.70 0.73  CALCIUM  8.9  --  8.5*  MG 2.0  --   --   GFRNONAA >60  --  >60  ANIONGAP 13  --  10    Recent Labs  Lab 09/04/23 1550  PROT 6.6  ALBUMIN  3.8  AST 46*  ALT 26  ALKPHOS 62  BILITOT 2.3*   Lipids No results for input(s): CHOL, TRIG, HDL, LABVLDL, LDLCALC, CHOLHDL in the last 168 hours.  Hematology Recent Labs  Lab 09/04/23 1550 09/04/23 1604 09/05/23 0535  WBC 15.4*  --  10.5  RBC 4.69  --  4.11*  HGB 14.2 14.6 12.6*  HCT 41.3 43.0 36.4*  MCV 88.1  --  88.6  MCH 30.3  --  30.7  MCHC 34.4  --  34.6  RDW 14.1  --  14.2  PLT 194  --  156   Thyroid  No results for input(s): TSH, FREET4 in the last 168 hours.  BNPNo results for input(s): BNP, PROBNP in the last 168 hours.  DDimer No results for input(s): DDIMER in the last 168 hours.  Radiology/Studies:  MR BRAIN WO CONTRAST Result Date: 09/04/2023 CLINICAL DATA:  Syncope, presyncope. EXAM: MRI HEAD WITHOUT CONTRAST TECHNIQUE: Multiplanar, multiecho pulse sequences of the brain and surrounding structures were obtained without intravenous contrast. COMPARISON:  Same day CT head. FINDINGS: Brain: Punctate focus of restricted diffusion in the left hippocampus (series 5, image 72). No acute hemorrhage, hydrocephalus, extra-axial collection or mass lesion. Cerebral atrophy. 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 Vascular: Major arterial flow voids are maintained skull base. Skull and upper cervical spine: Normal marrow signal. Sinuses/Orbits: Clear sinuses.  Neck overall findings. IMPRESSION: Punctate focus of restricted diffusion in the left hippocampus, compatible with transient global amnesia versus acute infarct. Electronically Signed   By: Gilmore GORMAN Molt M.D.   On:  09/04/2023 23:43   DG Ankle 2 Views Right Result Date: 09/04/2023 CLINICAL DATA:  Pain after fall at home, initial encounter. EXAM: RIGHT ANKLE - 2 VIEW COMPARISON:  None Available. FINDINGS: Lateral view is limited by positioning. Allowing for this, no evidence of acute fracture. No dislocation. The ankle mortise is preserved on AP view. Cannot assess for joint effusion. No focal soft tissue abnormalities. IMPRESSION: No acute fracture or dislocation of the right ankle. Electronically Signed   By: Andrea Gasman M.D.   On: 09/04/2023 20:35   CT HEAD WO CONTRAST Result Date: 09/04/2023 CLINICAL DATA:  Provided history: Head trauma, moderate/severe. Polytrauma, blunt. EXAM: CT HEAD WITHOUT CONTRAST CT CERVICAL SPINE WITHOUT CONTRAST TECHNIQUE: Multidetector CT imaging of the head and cervical spine was performed following the standard protocol without intravenous contrast. Multiplanar CT image reconstructions of the cervical spine were also generated. RADIATION DOSE REDUCTION: This exam was performed according to the departmental dose-optimization program which includes automated exposure control, adjustment of the mA and/or kV according to patient size and/or use of iterative reconstruction technique. COMPARISON:  None. FINDINGS: CT HEAD FINDINGS Brain: Generalized cerebral atrophy. Prominence of the ventricles and sulci, which appears commensurate. Patchy and ill-defined hypoattenuation within the cerebral white matter, nonspecific but compatible with mild chronic small vessel ischemic disease. Mega cisterna magna (anatomic variant). There is no acute intracranial hemorrhage. No demarcated cortical infarct. No extra-axial fluid collection. No evidence of an intracranial mass. No midline shift.  a Vascular:  No hyperdense vessel.  Atherosclerotic calcifications. Skull: No calvarial fracture or aggressive osseous lesion. Sinuses/Orbits: No mass or acute finding within the imaged orbits. Left scleral buckle.  Prior bilateral ocular lens replacement. No significant paranasal sinus disease. CT CERVICAL SPINE FINDINGS Alignment: Dextrocurvature of the cervical spine. Levocurvature of the visible upper thoracic spine. 2 mm C7-T1 grade 1 anterolisthesis. Skull base and vertebrae: The basion-dental and atlanto-dental intervals are maintained.No evidence of acute fracture to the cervical spine. Soft tissues and spinal canal: No prevertebral fluid or swelling. No visible canal hematoma. Disc levels: Cervical spondylosis with multilevel disc space narrowing, disc bulges/central disc protrusions, endplate spurring and uncovertebral hypertrophy. Facet arthropathy on the left at C7-T1. No appreciable high-grade spinal canal stenosis. Multilevel bony neural foraminal narrowing, greatest on the left at C3-C4, C4-C5 and C5-C6. Degenerative changes also present at the C1-C2 articulation. Upper chest: No consolidation within the imaged lung apices. No visible pneumothorax. IMPRESSION: CT head: 1. No evidence of an acute intracranial abnormality. 2. Parenchymal atrophy and chronic small vessel ischemic disease. CT cervical spine: 1. No evidence of an acute cervical spine fracture. 2. 2 mm C7-T1 grade 1 anterolisthesis. 3. Dextrocurvature of the cervical spine. 4. Cervical spondylosis as described. Electronically Signed   By: Rockey Childs D.O.   On: 09/04/2023 17:01   CT CERVICAL SPINE WO CONTRAST Result Date: 09/04/2023 CLINICAL DATA:  Provided history: Head trauma, moderate/severe. Polytrauma, blunt. EXAM: CT HEAD WITHOUT CONTRAST CT CERVICAL SPINE WITHOUT CONTRAST TECHNIQUE: Multidetector CT imaging of the head and cervical spine was performed following the standard protocol without intravenous contrast. Multiplanar CT image reconstructions of the cervical spine were also generated. RADIATION DOSE REDUCTION: This exam was performed according to the departmental dose-optimization program which includes automated exposure control,  adjustment of the mA and/or kV according to patient size and/or use of iterative reconstruction technique. COMPARISON:  None. FINDINGS: CT HEAD FINDINGS Brain: Generalized cerebral atrophy. Prominence of the ventricles and sulci, which appears commensurate. Patchy and ill-defined hypoattenuation within the cerebral white matter, nonspecific but compatible with mild chronic small vessel ischemic disease. Mega cisterna magna (anatomic variant). There is no acute intracranial hemorrhage. No demarcated cortical infarct. No extra-axial fluid collection. No evidence of an intracranial mass. No midline shift.  a Vascular: No hyperdense vessel.  Atherosclerotic calcifications. Skull: No calvarial fracture or aggressive osseous lesion. Sinuses/Orbits: No mass or acute finding within the imaged orbits. Left scleral buckle. Prior bilateral ocular lens replacement. No significant paranasal sinus disease. CT CERVICAL SPINE FINDINGS Alignment: Dextrocurvature of the cervical spine. Levocurvature of the visible upper thoracic spine. 2 mm C7-T1 grade 1 anterolisthesis. Skull base and vertebrae: The basion-dental and atlanto-dental intervals are maintained.No evidence of acute fracture to the cervical spine. Soft tissues and spinal canal: No prevertebral fluid or swelling. No visible canal hematoma. Disc levels: Cervical spondylosis with multilevel disc space narrowing, disc bulges/central disc protrusions, endplate spurring and uncovertebral hypertrophy. Facet arthropathy on the left at C7-T1. No appreciable high-grade spinal canal stenosis. Multilevel bony neural foraminal narrowing, greatest on the left at C3-C4, C4-C5 and C5-C6. Degenerative changes also present at the C1-C2 articulation. Upper chest: No consolidation within the imaged lung apices. No visible pneumothorax. IMPRESSION: CT head: 1. No evidence of an acute intracranial abnormality. 2. Parenchymal atrophy and chronic small vessel ischemic disease. CT cervical spine:  1. No evidence of an acute cervical spine fracture. 2. 2 mm C7-T1 grade 1 anterolisthesis. 3. Dextrocurvature of the cervical spine. 4. Cervical spondylosis as described. Electronically Signed  By: Rockey Childs D.O.   On: 09/04/2023 17:01   CT CHEST ABDOMEN PELVIS W CONTRAST Result Date: 09/04/2023 CLINICAL DATA:  Found down, fell, hip injury EXAM: CT CHEST, ABDOMEN, AND PELVIS WITH CONTRAST TECHNIQUE: Multidetector CT imaging of the chest, abdomen and pelvis was performed following the standard protocol during bolus administration of intravenous contrast. RADIATION DOSE REDUCTION: This exam was performed according to the departmental dose-optimization program which includes automated exposure control, adjustment of the mA and/or kV according to patient size and/or use of iterative reconstruction technique. CONTRAST:  75mL OMNIPAQUE  IOHEXOL  350 MG/ML SOLN COMPARISON:  09/04/2023, 05/03/2023 FINDINGS: CT CHEST FINDINGS Cardiovascular: Postsurgical changes from median sternotomy and mitral valve replacement. Surgical clip across the left atrial appendage. Heart is otherwise unremarkable without pericardial effusion. No evidence of vascular injury. No evidence of thoracic aortic aneurysm or dissection. Atherosclerosis of the aortic arch. Mediastinum/Nodes: No enlarged mediastinal, hilar, or axillary lymph nodes. Thyroid  gland, trachea, and esophagus demonstrate no significant findings. Lungs/Pleura: No acute airspace disease, effusion, or pneumothorax. Minimal hypoventilatory changes or subpleural scarring within the dependent lower lobes. Central airways are patent. Musculoskeletal: Prior median sternotomy. No acute or destructive bony abnormalities. Reconstructed images demonstrate no additional findings. CT ABDOMEN PELVIS FINDINGS Hepatobiliary: No hepatic injury or perihepatic hematoma. Gallbladder is unremarkable. Pancreas: Unremarkable. No pancreatic ductal dilatation or surrounding inflammatory changes.  Spleen: No splenic injury or perisplenic hematoma. Adrenals/Urinary Tract: No adrenal hemorrhage or renal injury identified. Bladder is unremarkable. Stomach/Bowel: No bowel obstruction or ileus. Normal appendix right lower quadrant. Diverticulosis of the descending and sigmoid colon without diverticulitis. No bowel wall thickening or inflammatory change. Vascular/Lymphatic: Aortic atherosclerosis. No enlarged abdominal or pelvic lymph nodes. Reproductive: Prostate is unremarkable. Other: No free fluid or free intraperitoneal gas. No abdominal wall hernia. Musculoskeletal: Comminuted impacted subcapital left femoral neck fracture is identified, with ventral and varus angulation at the fracture site. No dislocation. No other acute displaced fractures. Moderate soft tissue swelling surrounding the left hip fracture. Reconstructed images demonstrate no additional findings. IMPRESSION: 1. Comminuted subcapital left femoral neck fracture. 2. Otherwise no acute intrathoracic, intra-abdominal, or intrapelvic trauma. 3.  Aortic Atherosclerosis (ICD10-I70.0). 4. Distal colonic diverticulosis without diverticulitis. Electronically Signed   By: Ozell Daring M.D.   On: 09/04/2023 16:49   DG Pelvis Portable Result Date: 09/04/2023 CLINICAL DATA:  Trauma fall EXAM: PORTABLE PELVIS 1-2 VIEWS COMPARISON:  None Available. FINDINGS: SI joints are non widened. Pubic symphysis and rami appear intact. Acute displaced left femoral neck fracture. No femoral head dislocation IMPRESSION: Acute displaced left femoral neck fracture. Electronically Signed   By: Luke Bun M.D.   On: 09/04/2023 15:58   DG Chest Port 1 View Result Date: 09/04/2023 CLINICAL DATA:  Trauma fall EXAM: PORTABLE CHEST 1 VIEW COMPARISON:  12/26/2022 FINDINGS: Post sternotomy changes with valve prosthesis and atrial appendage clip. Hyperinflated lungs. No focal opacity, pleural effusion, or pneumothorax. IMPRESSION: No active disease. Hyperinflated lungs.  Electronically Signed   By: Luke Bun M.D.   On: 09/04/2023 15:57     Assessment and Plan:  Preoperative evaluation for THA Hx MVR  Patient reportedly inebriated, tripped and fell and sustained right hip fracture.  He has history of MVR in 2024 with stable valve function on echocardiogram November 2024.  Cath workup for this demonstrated no evidence of any significant CAD.  Also has had coronary CT with a score of 205.  He has absolutely 0 complaints and still very functional.  Able to complete greater than 4 METS.  His RCRI is 3.9% associated with inherent risk of any procedure.  Likely with no further recommendations/workup.  Of note: Primary team had ordered troponins and repeat EKG for slight anterolateral ST depressions with artifact noted throughout EKG.  Troponins are 55-50 and he is completely asymptomatic.  Would not want to work this up any further given reassuring workup. Needs to have potassium corrected prior to procedure, today to 3.0 Monitor for postoperative atrial fibrillation.  Paroxysmal atrial fibrillation - Maze procedure/LAA clip 12/2022 - Ablation 05/2023. - Off anticoagulation 08/2023 Noted postoperatively after his valve procedure, follow-up monitor had showed 12% burden of atrial fibrillation.  He is status post ablation March 2025 with no evidence of recurrence.  Now off of anticoagulation.  He is in sinus rhythm here. Continue with Lopressor  50 mg twice daily.   Risk Assessment/Risk Scores:   CHA2DS2-VASc Score = 2   This indicates a 2.2% annual risk of stroke. The patient's score is based upon: CHF History: 0 HTN History: 1 Diabetes History: 0 Stroke History: 0 Vascular Disease History: 0 Age Score: 1 Gender Score: 0    For questions or updates, please contact [facility] Please consult [email or web address] for contact info under    Signed, Thom LITTIE Sluder, PA-C  09/05/2023 11:16 AM   I have personally seen and examined this patient. I agree  with the assessment and plan as outlined above.  73 yo male with history of PAF post ablation and mitral regurg s/p MV repair with LAA closure. Doing well from cardiac standpoint. Mechanical fall over carpet while drinking alcohol and sustained hip fracture.  Cardiology consult for risk assessment prior to hip surgery Pt has no complaints EKG is not concerning. REviewed by me: sinus Labs reviewed My exam: NAD RRR Lungs clear Ext: no LE edema Plan: CV risk assessment in pt with MV disease and PAF. He can proceed with his planned procedure.   Lonni Cash, MD, [facility] 09/05/2023 11:32 AM

## 2023-09-05 NOTE — H&P (Signed)

## 2023-09-05 NOTE — Anesthesia Procedure Notes (Signed)
 Procedure Name: Intubation Date/Time: 09/05/2023 1:59 PM  Performed by: Jolynn Mage, CRNAPre-anesthesia Checklist: Patient identified, Patient being monitored, Timeout performed, Emergency Drugs available and Suction available Patient Re-evaluated:Patient Re-evaluated prior to induction Oxygen Delivery Method: Circle System Utilized Preoxygenation: Pre-oxygenation with 100% oxygen Induction Type: IV induction Ventilation: Mask ventilation without difficulty Laryngoscope Size: Miller and 3 Grade View: Grade I Tube type: Oral Tube size: 7.5 mm Number of attempts: 1 Airway Equipment and Method: Stylet Placement Confirmation: ETT inserted through vocal cords under direct vision, positive ETCO2 and breath sounds checked- equal and bilateral Secured at: 22 cm Tube secured with: Tape Dental Injury: Teeth and Oropharynx as per pre-operative assessment

## 2023-09-05 NOTE — Consult Note (Signed)
 NEUROLOGY CONSULT NOTE   Date of service: September 05, 2023 Patient Name: James Ibarra MRN:  [id number] DOB:  03/21/1950 Chief Complaint: ? Stroke on MRI brain  Requesting Provider: Madelyne Owen LABOR, MD  History of Present Illness  [name] is a 73 y.o. male with hx of MR s/p MAZE 12/2022, A fib on no AC (Eliquis  discontinued 08/30/2023, a fib ablation 05/24/2023, HTN, HLD, CAD, trigeminal neuralgia, ETOH abuse  who presented to the ED after having an unwitnessed fall. HE was found on the floor by his wife with alcohol bottles around him. He is also found to have a left hip fracture.  MRI brain was obtained and revealed Punctate focus of restricted diffusion in the left hippocampus, compatible with transient global amnesia versus acute infarct.  No family at the bedside. Patient is awake and alert. He states he started drinking again and is drinking 1/2 liter per day. He endorses he wants to stop. He does not seem to be a great historian, but tells me that he tripped on a rug in his house. He stated he was not able to get up because he left leg was hurting. He states he did not pass out or hit his head. He does endorse drinking yesterday.  He denies any weakness, numbness/tingling, facial droop or slurred speech On exam he is awake and alert x 4, no aphasia or dysarthria. PERRL, no visual field deficit, face is symmetric, tongue is midline, bilateral uppers 5/5, right lower 4-/5, left lower can wiggle toes, unable to lift due to hip fracture. Sensation is intact, no ataxia  LKW: PTA Modified rankin score: 0-Completely asymptomatic and back to baseline post- stroke IV Thrombolysis:  No outside window  EVT:  No LVO   NIHSS components Score: Comment  1a Level of Conscious 0[]  1[]  2[]  3[]      1b LOC Questions 0[]  1[]  2[]       1c LOC Commands 0[]  1[]  2[]       2 Best Gaze 0[]  1[]  2[]       3 Visual 0[]  1[]  2[]  3[]      4 Facial Palsy 0[]  1[]  2[]  3[]      5a Motor Arm - left 0[]  1[]  2[]  3[]   4[]  UN[]    5b Motor Arm - Right 0[]  1[]  2[]  3[]  4[]  UN[]    6a Motor Leg - Left 0[]  1[]  2[]  3[x]  4[]  UN[]    6b Motor Leg - Right 0[]  1[]  2[]  3[]  4[]  UN[]    7 Limb Ataxia 0[]  1[]  2[]  UN[]      8 Sensory 0[]  1[]  2[]  UN[]      9 Best Language 0[]  1[]  2[]  3[]      10 Dysarthria 0[]  1[]  2[]  UN[]      11 Extinct. and Inattention 0[]  1[]  2[]       TOTAL: 3      ROS  Comprehensive ROS performed and pertinent positives documented in HPI    Past History   Past Medical History:  Diagnosis Date   Atrial fibrillation (HCC)    Cancer (HCC)    Skin Cancer - Scalp, Right neck, Left Hand   Dysrhythmia    A. Fib while hospitalized June 2024   Heart murmur    History of nonmelanoma skin cancer    Hypercholesteremia    Hypertension    Pneumonia 08/2022   Trigeminal neuralgia     Past Surgical History:  Procedure Laterality Date   ATRIAL FIBRILLATION ABLATION N/A 05/24/2023   Procedure: ATRIAL FIBRILLATION ABLATION;  Surgeon: Nancey,  Eulas BRAVO, MD;  Location: [facility];  Service: Cardiovascular;  Laterality: N/A;   CATARACT EXTRACTION W/ INTRAOCULAR LENS IMPLANT Bilateral    CLIPPING OF ATRIAL APPENDAGE Left 12/08/2022   Procedure: CLIPPING OF ATRIAL APPENDAGE (LAA) USING 50 MM ATRICLIP;  Surgeon: Maryjane Mt, MD;  Location: [facility] OR;  Service: Open Heart Surgery;  Laterality: Left;   INGUINAL HERNIA REPAIR  11/07/2011   Procedure: LAPAROSCOPIC INGUINAL HERNIA;  Surgeon: Camellia CHRISTELLA Blush, MD,FACS;  Location: [facility];  Service: General;  Laterality: Left;   MAZE N/A 12/08/2022   Procedure: MAZE;  Surgeon: Maryjane Mt, MD;  Location: [facility] OR;  Service: Open Heart Surgery;  Laterality: N/A;   MITRAL VALVE REPAIR N/A 12/08/2022   Procedure: MITRAL VALVE REPAIR (MVR) USING 34 MM SIMULUS SEMI- RIGID ANNULOPLASTY BAND;  Surgeon: Maryjane Mt, MD;  Location: [facility] OR;  Service: Open Heart Surgery;  Laterality: N/A;   RETINAL DETACHMENT SURGERY  03/07/1995   RIGHT/LEFT HEART CATH AND CORONARY ANGIOGRAPHY  N/A 10/12/2022   Procedure: RIGHT/LEFT HEART CATH AND CORONARY ANGIOGRAPHY;  Surgeon: Wendel Lurena POUR, MD;  Location: [facility];  Service: Cardiovascular;  Laterality: N/A;   SKIN CANCER EXCISION  2012, 1999   basal cell - neck and hand   TEE WITHOUT CARDIOVERSION N/A 10/16/2022   Procedure: TRANSESOPHAGEAL ECHOCARDIOGRAM;  Surgeon: Santo Stanly LABOR, MD;  Location: [facility];  Service: Cardiovascular;  Laterality: N/A;   TEE WITHOUT CARDIOVERSION N/A 12/08/2022   Procedure: TRANSESOPHAGEAL ECHOCARDIOGRAM;  Surgeon: Maryjane Mt, MD;  Location: [facility] OR;  Service: Open Heart Surgery;  Laterality: N/A;    Family History: Family History  Problem Relation Age of Onset   Heart disease Father    Heart disease Mother     Social History  reports that he has never smoked. He has never used smokeless tobacco. He reports that he does not currently use alcohol. He reports that he does not use drugs.  No Known Allergies  Medications   Current Facility-Administered Medications:    bisacodyl  (DULCOLAX) EC tablet 5 mg, 5 mg, Oral, Daily PRN, Tobie Mario GAILS, MD   hydrALAZINE  (APRESOLINE ) injection 10 mg, 10 mg, Intravenous, Q6H PRN, Tobie Mario GAILS, MD   HYDROcodone -acetaminophen  (NORCO/VICODIN) 5-325 MG per tablet 1-2 tablet, 1-2 tablet, Oral, Q6H PRN, Tobie Mario GAILS, MD, 2 tablet at 09/05/23 9350   irbesartan  (AVAPRO ) tablet 150 mg, 150 mg, Oral, Daily, Tobie Mario V, MD, 150 mg at 09/04/23 2018   LORazepam  (ATIVAN ) tablet 1-4 mg, 1-4 mg, Oral, Q1H PRN **OR** LORazepam  (ATIVAN ) injection 1-4 mg, 1-4 mg, Intravenous, Q1H PRN, Tobie Mario GAILS, MD, 2 mg at 09/04/23 1931   methocarbamol (ROBAXIN) tablet 500 mg, 500 mg, Oral, Q6H PRN **OR** methocarbamol (ROBAXIN) injection 500 mg, 500 mg, Intravenous, Q6H PRN, Tobie Mario GAILS, MD   metoprolol  tartrate (LOPRESSOR ) tablet 50 mg, 50 mg, Oral, BID, Patel, Ekta V, MD, 50 mg at 09/04/23 2133   morphine  (PF) 2 MG/ML injection 2 mg, 2 mg,  Intravenous, Q2H PRN, Tobie Mario V, MD, 2 mg at 09/05/23 0518   multivitamin (PROSIGHT) tablet 1 tablet, 1 tablet, Oral, BID, Patel, Ekta V, MD   multivitamin with minerals tablet 1 tablet, 1 tablet, Oral, Daily, Tobie Mario GAILS, MD, 1 tablet at 09/04/23 1931   mupirocin ointment (BACTROBAN) 2 % 1 Application, 1 Application, Nasal, BID, Tobie Mario GAILS, MD, 1 Application at 09/05/23 0032   polyethylene glycol (MIRALAX  / GLYCOLAX ) packet 17 g, 17 g, Oral,  Daily PRN, Tobie Mario GAILS, MD   potassium chloride  10 mEq in 100 mL IVPB, 10 mEq, Intravenous, Q1 Hr x 4, Regalado, Belkys A, MD  Vitals   Vitals:   23-Sep-2023 2115 09/05/23 0007 09/05/23 0422 09/05/23 0744  BP: (!) 162/94 (!) 169/100 (!) 164/92 (!) 171/94  Pulse: 88 78 65 65  Resp: (!) 25 18 18 16   Temp:  98.5 F (36.9 C) (!) 97.4 F (36.3 C) 98.2 F (36.8 C)  TempSrc:  Oral Oral   SpO2: 99% 94% 95% 95%  Weight:      Height:        Body mass index is 21.23 kg/m.   Physical Exam   Constitutional: elderly male in NAD Psych: Affect appropriate to situation.   Eyes: No scleral injection.   HENT: No OP obstruction.   Head: Normocephalic.   Cardiovascular: Normal rate and regular rhythm.   Respiratory: Effort normal, non-labored breathing.   GI: Soft.  No distension. There is no tenderness.   Skin: WDI.    Neurologic Examination   Mental Status -  Level of arousal and orientation to time, place, and person were intact. Language including expression, naming, repetition, comprehension was assessed and found intact.   Cranial Nerves II - XII - II - Visual field intact OU. III, IV, VI - Extraocular movements intact. V - Facial sensation intact bilaterally. VII - Facial movement intact bilaterally. VIII - Hearing & vestibular intact bilaterally. X - Palate elevates symmetrically . XI - Chin turning & shoulder shrug intact bilaterally. XII - Tongue protrusion intact.  Motor Strength - The patient's strength was normal in  bilateral uppers, right lower 4-/5, left lower can wiggle toes, unable to lift due to hip fracture  Bulk was normal and fasciculations were absent.   Motor Tone - Muscle tone was assessed at the neck and appendages and was normal. Sensory - Light touch, temperature/pinprick were assessed and were symmetrical.   Coordination - FTN in tact  Gait and Station - deferred.  Labs/Imaging/Neurodiagnostic studies   CBC:  Recent Labs  Lab 09-23-2023 1550 2023-09-23 1604 09/05/23 0535  WBC 15.4*  --  10.5  HGB 14.2 14.6 12.6*  HCT 41.3 43.0 36.4*  MCV 88.1  --  88.6  PLT 194  --  156   Basic Metabolic Panel:  Lab Results  Component Value Date   NA 137 09/05/2023   K 3.0 (L) 09/05/2023   CO2 29 09/05/2023   GLUCOSE 117 (H) 09/05/2023   BUN 13 09/05/2023   CREATININE 0.73 09/05/2023   CALCIUM  8.5 (L) 09/05/2023   GFRNONAA >60 09/05/2023   GFRAA >90 11/03/2011   Lipid Panel:  Lab Results  Component Value Date   LDLCALC 83 10/04/2022   HgbA1c:  Lab Results  Component Value Date   HGBA1C 5.1 12/06/2022   Urine Drug Screen: No results found for: LABOPIA, COCAINSCRNUR, LABBENZ, AMPHETMU, THCU, LABBARB  Alcohol Level     Component Value Date/Time   ETH <15 09-23-23 1550   INR  Lab Results  Component Value Date   INR 1.0 Sep 23, 2023   APTT  Lab Results  Component Value Date   APTT 37 (H) 12/08/2022   AED levels: No results found for: PHENYTOIN, ZONISAMIDE, LAMOTRIGINE, LEVETIRACETA  CT Head without contrast(Personally reviewed): 1. No evidence of an acute intracranial abnormality. 2. Parenchymal atrophy and chronic small vessel ischemic disease.  MRI Brain(Personally reviewed): Punctate focus of restricted diffusion in the left hippocampus, compatible with transient global amnesia  versus acute infarct.    ASSESSMENT   MAURIE OLESEN is a 73 y.o. male with hx of MR s/p MAZE 12/2022, A fib on no AC (Eliquis  discontinued 08/30/2023, a fib ablation  05/24/2023, HTN, HLD, CAD, trigeminal neuralgia, ETOH abuse  who presented to the ED after having an unwitnessed fall. HE was found on the floor by his wife with alcohol bottles around him. He is also found to have a left hip fracture.  MRI brain was obtained and revealed Punctate focus of restricted diffusion in the left hippocampus, compatible with transient global amnesia versus acute infarct.  RECOMMENDATIONS  - Ok to undergo left hip repair  - Stroke workup to include  - HgbA1c, fasting lipid panel - Frequent neuro checks - Echocardiogram - CTA head and neck - Prophylactic therapy-Antiplatelet med: Aspirin  - dose 81mg  and plavix 75mg  daily  - Risk factor modification - Telemetry monitoring - PT consult, OT consult, Speech consult - Stroke team to follow ______________________________________________________________________  Signed, Karna DELENA Geralds, NP [facility]  I have seen the patient and reviewed the above note.  He has a punctate focus of restricted diffusion in the left hippocampus, but no episode consistent with transient global amnesia and therefore I do suspect that this is likely an incidental finding of a small punctate infarct.  He will need secondary risk factor modification with lipid control, BP control, etc.  He did just stop anticoagulation following an ablation and I do think he needs prolonged cardiac monitoring to ensure that he has not had recurrent atrial fibrillation as this could be a tiny embolus versus a punctate small vessel stroke.  He does admit to drinking, and has been hiding it some from his wife.  My suspicion is that he has had mild alcohol withdrawal and appears to be improving.  At this point, though there is some small increased risk associated with surgery in the acute setting, given the need for the surgery with his femur fracture, I think the risk is very tolerable and I would not oppose proceeding with surgery this afternoon.  I would  favor starting antiplatelet therapy following surgery.  Aisha Seals, MD [facility]   If 7pm- 7am, please page neurology on call as listed in [facility].

## 2023-09-05 NOTE — TOC CAGE-AID Note (Signed)
 Transition of Care [facility]) - CAGE-AID Screening  Patient Details  Name: James Ibarra MRN: [id number] Date of Birth: 1951/01/14  Clinical Narrative:  Patient reports no current alcohol or drug use, substance abuse resources not provided at this time.  CAGE-AID Screening:   Have You Ever Felt You Ought to Cut Down on Your Drinking or Drug Use?: No Have People Annoyed You By Critizing Your Drinking Or Drug Use?: No Have You Felt Bad Or Guilty About Your Drinking Or Drug Use?: No Have You Ever Had a Drink or Used Drugs First Thing In The Morning to Steady Your Nerves or to Get Rid of a Hangover?: No CAGE-AID Score: 0  Substance Abuse Education Offered: No

## 2023-09-05 NOTE — Progress Notes (Signed)
 PROGRESS NOTE    James Ibarra  FMW:[id number] DOB: [birth year] DOA: 09/04/2023 PCP: Kip Righter, MD   Brief Narrative: 73 year old with past medical history significant for MR status post repair, hypertension, hyperlipidemia, CAD, A-fib underwent mitral valve surgery with maze with left atrial appendage clipping on October 2024.  Patient was found on the ground around 130 the day of admission, no report of loss of consciousness.  He was found to have left hip fracture.  Patient still drinking alcohol.  No acute findings.  CT abdomen and pelvis showed comminuted Left femoral neck fracture.   Assessment & Plan:   Principal Problem:   Subcapital fracture of neck of left femur, closed, initial encounter [facility]) Active Problems:   Fall at home, initial encounter   1-Left subcapital femoral neck fracture: - Presents after a fall found to have left subcapital femoral neck fracture. - She was noted to have a stroke on MRI, neurology consulted.  This is not a stroke no need to delay surgery -Abnormal EKG and Elevated Troponin, He Denies Chest Pain Shortness of Breath.  Cardiology Has Cleared Him for Surgery  Fall, ? Syncope MRI: Punctate focus of restricted diffusion in the left hippocampus, compatible with transient global amnesia versus acute infarct. -Neurology Consulted.  - Will need PT OT  Abnormal EKG Elevation troponin;  Repeat troponin.  Troponin stable at 50. Cardiology no further evaluation needed  Alcohol Use;  Continue CIWA Continue thiamine  and folic acid   Paroxysmal A-fib - Continue metoprolol  - He had a maze procedure last year with mitral valve repair.  Hypertension - Continue metoprolol  and losartan -As needed hydralazine   Chronic diastolic heart failure - Monitor volume status  CAD without angina - Continue metoprolol  and statin  Rhabdomyolysis - Continue with IV fluid  Right ankle pain - X-ray:No acute fracture or dislocation of the right ankle.      Hypokalemia: Repleted IV    Estimated body mass index is 21.23 kg/m as calculated from the following:   Height as of this encounter: 6' 1 (1.854 m).   Weight as of this encounter: 73 kg.   DVT prophylaxis: SCD Code Status: Full code Family Communication: No family at bedside Disposition Plan:  Status is: Inpatient Remains inpatient appropriate because: management of hip fracture    Consultants:  Neurology  Cardiology Ortho  Procedures:    Antimicrobials:    Subjective: He is alert and conversant, he thinks that he tripped over the carpet he denies chest pain and shortness of breath.  He is able to climb  2 flights of stairs.  Objective: Vitals:   09/04/23 2007 09/04/23 2115 09/05/23 0007 09/05/23 0422  BP: (!) 168/97 (!) 162/94 (!) 169/100 (!) 164/92  Pulse: 90 88 78 65  Resp:  (!) 25 18 18   Temp:   98.5 F (36.9 C) (!) 97.4 F (36.3 C)  TempSrc:   Oral Oral  SpO2:  99% 94% 95%  Weight:      Height:        Intake/Output Summary (Last 24 hours) at 09/05/2023 0723 Last data filed at 09/05/2023 0600 Gross per 24 hour  Intake 2000 ml  Output 300 ml  Net 1700 ml   Filed Weights   09/04/23 1516  Weight: 73 kg    Examination:  General exam: Appears calm and comfortable  Respiratory system: Clear to auscultation. Respiratory effort normal. Cardiovascular system: S1 & S2 heard, RRR. No JVD, murmurs, rubs, gallops or clicks. No pedal edema. Gastrointestinal system: Abdomen is  nondistended, soft and nontender. No organomegaly or masses felt. Normal bowel sounds heard. Central nervous system: Alert and oriented. Follows command Extremities: left LE shorter./   Data Reviewed: I have personally reviewed following labs and imaging studies  CBC: Recent Labs  Lab 09/04/23 1550 09/04/23 1604 09/05/23 0535  WBC 15.4*  --  10.5  HGB 14.2 14.6 12.6*  HCT 41.3 43.0 36.4*  MCV 88.1  --  88.6  PLT 194  --  156   Basic Metabolic Panel: Recent Labs  Lab  09/04/23 1550 09/04/23 1604 09/05/23 0535  NA 138 141 137  K 3.2* 3.2* 3.0*  CL 101 103 98  CO2 24  --  29  GLUCOSE 127* 125* 117*  BUN 16 18 13   CREATININE 0.84 0.70 0.73  CALCIUM  8.9  --  8.5*  MG 2.0  --   --    GFR: Estimated Creatinine Clearance: 86.2 mL/min (by C-G formula based on SCr of 0.73 mg/dL). Liver Function Tests: Recent Labs  Lab 09/04/23 1550  AST 46*  ALT 26  ALKPHOS 62  BILITOT 2.3*  PROT 6.6  ALBUMIN  3.8   No results for input(s): LIPASE, AMYLASE in the last 168 hours. No results for input(s): AMMONIA in the last 168 hours. Coagulation Profile: Recent Labs  Lab 09/04/23 1550  INR 1.0   Cardiac Enzymes: Recent Labs  Lab 09/04/23 1550  CKTOTAL 780*   BNP (last 3 results) No results for input(s): PROBNP in the last 8760 hours. HbA1C: No results for input(s): HGBA1C in the last 72 hours. CBG: No results for input(s): GLUCAP in the last 168 hours. Lipid Profile: No results for input(s): CHOL, HDL, LDLCALC, TRIG, CHOLHDL, LDLDIRECT in the last 72 hours. Thyroid  Function Tests: No results for input(s): TSH, T4TOTAL, FREET4, T3FREE, THYROIDAB in the last 72 hours. Anemia Panel: No results for input(s): VITAMINB12, FOLATE, FERRITIN, TIBC, IRON , RETICCTPCT in the last 72 hours. Sepsis Labs: Recent Labs  Lab 09/04/23 1604  LATICACIDVEN 3.0*    Recent Results (from the past 240 hours)  Surgical PCR screen     Status: None   Collection Time: 09/05/23 12:13 AM   Specimen: Nasal Mucosa; Nasal Swab  Result Value Ref Range Status   MRSA, PCR NEGATIVE NEGATIVE Final   Staphylococcus aureus NEGATIVE NEGATIVE Final    Comment: (NOTE) The Xpert SA Assay (FDA approved for NASAL specimens in patients 46 years of age and older), is one component of a comprehensive surveillance program. It is not intended to diagnose infection nor to guide or monitor treatment. Performed at [facility], [address], [place], [place] [address]          Radiology Studies: MR BRAIN WO CONTRAST Result Date: 09/04/2023 CLINICAL DATA:  Syncope, presyncope. EXAM: MRI HEAD WITHOUT CONTRAST TECHNIQUE: Multiplanar, multiecho pulse sequences of the brain and surrounding structures were obtained without intravenous contrast. COMPARISON:  Same day CT head. FINDINGS: Brain: Punctate focus of restricted diffusion in the left hippocampus (series 5, image 72). No acute hemorrhage, hydrocephalus, extra-axial collection or mass lesion. Cerebral atrophy. 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 Vascular: Major arterial flow voids are maintained skull base. Skull and upper cervical spine: Normal marrow signal. Sinuses/Orbits: Clear sinuses.  Neck overall findings. IMPRESSION: Punctate focus of restricted diffusion in the left hippocampus, compatible with transient global amnesia versus acute infarct. Electronically Signed   By: Gilmore GORMAN Molt M.D.   On: 09/04/2023 23:43  DG Ankle 2 Views Right Result Date: 09/04/2023 CLINICAL DATA:  Pain after fall at home, initial encounter. EXAM: RIGHT ANKLE - 2 VIEW COMPARISON:  None Available. FINDINGS: Lateral view is limited by positioning. Allowing for this, no evidence of acute fracture. No dislocation. The ankle mortise is preserved on AP view. Cannot assess for joint effusion. No focal soft tissue abnormalities. IMPRESSION: No acute fracture or dislocation of the right ankle. Electronically Signed   By: Andrea Gasman M.D.   On: 09/04/2023 20:35   CT HEAD WO CONTRAST Result Date: 09/04/2023 CLINICAL DATA:  Provided history: Head trauma, moderate/severe. Polytrauma, blunt. EXAM: CT HEAD WITHOUT CONTRAST CT CERVICAL SPINE WITHOUT CONTRAST TECHNIQUE: Multidetector CT imaging of the head and cervical spine was performed following the standard protocol without intravenous contrast. Multiplanar CT image  reconstructions of the cervical spine were also generated. RADIATION DOSE REDUCTION: This exam was performed according to the departmental dose-optimization program which includes automated exposure control, adjustment of the mA and/or kV according to patient size and/or use of iterative reconstruction technique. COMPARISON:  None. FINDINGS: CT HEAD FINDINGS Brain: Generalized cerebral atrophy. Prominence of the ventricles and sulci, which appears commensurate. Patchy and ill-defined hypoattenuation within the cerebral white matter, nonspecific but compatible with mild chronic small vessel ischemic disease. Mega cisterna magna (anatomic variant). There is no acute intracranial hemorrhage. No demarcated cortical infarct. No extra-axial fluid collection. No evidence of an intracranial mass. No midline shift.  a Vascular: No hyperdense vessel.  Atherosclerotic calcifications. Skull: No calvarial fracture or aggressive osseous lesion. Sinuses/Orbits: No mass or acute finding within the imaged orbits. Left scleral buckle. Prior bilateral ocular lens replacement. No significant paranasal sinus disease. CT CERVICAL SPINE FINDINGS Alignment: Dextrocurvature of the cervical spine. Levocurvature of the visible upper thoracic spine. 2 mm C7-T1 grade 1 anterolisthesis. Skull base and vertebrae: The basion-dental and atlanto-dental intervals are maintained.No evidence of acute fracture to the cervical spine. Soft tissues and spinal canal: No prevertebral fluid or swelling. No visible canal hematoma. Disc levels: Cervical spondylosis with multilevel disc space narrowing, disc bulges/central disc protrusions, endplate spurring and uncovertebral hypertrophy. Facet arthropathy on the left at C7-T1. No appreciable high-grade spinal canal stenosis. Multilevel bony neural foraminal narrowing, greatest on the left at C3-C4, C4-C5 and C5-C6. Degenerative changes also present at the C1-C2 articulation. Upper chest: No consolidation within  the imaged lung apices. No visible pneumothorax. IMPRESSION: CT head: 1. No evidence of an acute intracranial abnormality. 2. Parenchymal atrophy and chronic small vessel ischemic disease. CT cervical spine: 1. No evidence of an acute cervical spine fracture. 2. 2 mm C7-T1 grade 1 anterolisthesis. 3. Dextrocurvature of the cervical spine. 4. Cervical spondylosis as described. Electronically Signed   By: Rockey Childs D.O.   On: 09/04/2023 17:01   CT CERVICAL SPINE WO CONTRAST Result Date: 09/04/2023 CLINICAL DATA:  Provided history: Head trauma, moderate/severe. Polytrauma, blunt. EXAM: CT HEAD WITHOUT CONTRAST CT CERVICAL SPINE WITHOUT CONTRAST TECHNIQUE: Multidetector CT imaging of the head and cervical spine was performed following the standard protocol without intravenous contrast. Multiplanar CT image reconstructions of the cervical spine were also generated. RADIATION DOSE REDUCTION: This exam was performed according to the departmental dose-optimization program which includes automated exposure control, adjustment of the mA and/or kV according to patient size and/or use of iterative reconstruction technique. COMPARISON:  None. FINDINGS: CT HEAD FINDINGS Brain: Generalized cerebral atrophy. Prominence of the ventricles and sulci, which appears commensurate. Patchy and ill-defined hypoattenuation within the cerebral white matter, nonspecific but compatible  with mild chronic small vessel ischemic disease. Mega cisterna magna (anatomic variant). There is no acute intracranial hemorrhage. No demarcated cortical infarct. No extra-axial fluid collection. No evidence of an intracranial mass. No midline shift.  a Vascular: No hyperdense vessel.  Atherosclerotic calcifications. Skull: No calvarial fracture or aggressive osseous lesion. Sinuses/Orbits: No mass or acute finding within the imaged orbits. Left scleral buckle. Prior bilateral ocular lens replacement. No significant paranasal sinus disease. CT CERVICAL  SPINE FINDINGS Alignment: Dextrocurvature of the cervical spine. Levocurvature of the visible upper thoracic spine. 2 mm C7-T1 grade 1 anterolisthesis. Skull base and vertebrae: The basion-dental and atlanto-dental intervals are maintained.No evidence of acute fracture to the cervical spine. Soft tissues and spinal canal: No prevertebral fluid or swelling. No visible canal hematoma. Disc levels: Cervical spondylosis with multilevel disc space narrowing, disc bulges/central disc protrusions, endplate spurring and uncovertebral hypertrophy. Facet arthropathy on the left at C7-T1. No appreciable high-grade spinal canal stenosis. Multilevel bony neural foraminal narrowing, greatest on the left at C3-C4, C4-C5 and C5-C6. Degenerative changes also present at the C1-C2 articulation. Upper chest: No consolidation within the imaged lung apices. No visible pneumothorax. IMPRESSION: CT head: 1. No evidence of an acute intracranial abnormality. 2. Parenchymal atrophy and chronic small vessel ischemic disease. CT cervical spine: 1. No evidence of an acute cervical spine fracture. 2. 2 mm C7-T1 grade 1 anterolisthesis. 3. Dextrocurvature of the cervical spine. 4. Cervical spondylosis as described. Electronically Signed   By: Rockey Childs D.O.   On: 09/04/2023 17:01   CT CHEST ABDOMEN PELVIS W CONTRAST Result Date: 09/04/2023 CLINICAL DATA:  Found down, fell, hip injury EXAM: CT CHEST, ABDOMEN, AND PELVIS WITH CONTRAST TECHNIQUE: Multidetector CT imaging of the chest, abdomen and pelvis was performed following the standard protocol during bolus administration of intravenous contrast. RADIATION DOSE REDUCTION: This exam was performed according to the departmental dose-optimization program which includes automated exposure control, adjustment of the mA and/or kV according to patient size and/or use of iterative reconstruction technique. CONTRAST:  75mL OMNIPAQUE  IOHEXOL  350 MG/ML SOLN COMPARISON:  09/04/2023, 05/03/2023 FINDINGS:  CT CHEST FINDINGS Cardiovascular: Postsurgical changes from median sternotomy and mitral valve replacement. Surgical clip across the left atrial appendage. Heart is otherwise unremarkable without pericardial effusion. No evidence of vascular injury. No evidence of thoracic aortic aneurysm or dissection. Atherosclerosis of the aortic arch. Mediastinum/Nodes: No enlarged mediastinal, hilar, or axillary lymph nodes. Thyroid  gland, trachea, and esophagus demonstrate no significant findings. Lungs/Pleura: No acute airspace disease, effusion, or pneumothorax. Minimal hypoventilatory changes or subpleural scarring within the dependent lower lobes. Central airways are patent. Musculoskeletal: Prior median sternotomy. No acute or destructive bony abnormalities. Reconstructed images demonstrate no additional findings. CT ABDOMEN PELVIS FINDINGS Hepatobiliary: No hepatic injury or perihepatic hematoma. Gallbladder is unremarkable. Pancreas: Unremarkable. No pancreatic ductal dilatation or surrounding inflammatory changes. Spleen: No splenic injury or perisplenic hematoma. Adrenals/Urinary Tract: No adrenal hemorrhage or renal injury identified. Bladder is unremarkable. Stomach/Bowel: No bowel obstruction or ileus. Normal appendix right lower quadrant. Diverticulosis of the descending and sigmoid colon without diverticulitis. No bowel wall thickening or inflammatory change. Vascular/Lymphatic: Aortic atherosclerosis. No enlarged abdominal or pelvic lymph nodes. Reproductive: Prostate is unremarkable. Other: No free fluid or free intraperitoneal gas. No abdominal wall hernia. Musculoskeletal: Comminuted impacted subcapital left femoral neck fracture is identified, with ventral and varus angulation at the fracture site. No dislocation. No other acute displaced fractures. Moderate soft tissue swelling surrounding the left hip fracture. Reconstructed images demonstrate no additional findings. IMPRESSION: 1. Comminuted  subcapital  left femoral neck fracture. 2. Otherwise no acute intrathoracic, intra-abdominal, or intrapelvic trauma. 3.  Aortic Atherosclerosis (ICD10-I70.0). 4. Distal colonic diverticulosis without diverticulitis. Electronically Signed   By: Ozell Daring M.D.   On: 09/04/2023 16:49   DG Pelvis Portable Result Date: 09/04/2023 CLINICAL DATA:  Trauma fall EXAM: PORTABLE PELVIS 1-2 VIEWS COMPARISON:  None Available. FINDINGS: SI joints are non widened. Pubic symphysis and rami appear intact. Acute displaced left femoral neck fracture. No femoral head dislocation IMPRESSION: Acute displaced left femoral neck fracture. Electronically Signed   By: Luke Bun M.D.   On: 09/04/2023 15:58   DG Chest Port 1 View Result Date: 09/04/2023 CLINICAL DATA:  Trauma fall EXAM: PORTABLE CHEST 1 VIEW COMPARISON:  12/26/2022 FINDINGS: Post sternotomy changes with valve prosthesis and atrial appendage clip. Hyperinflated lungs. No focal opacity, pleural effusion, or pneumothorax. IMPRESSION: No active disease. Hyperinflated lungs. Electronically Signed   By: Luke Bun M.D.   On: 09/04/2023 15:57        Scheduled Meds:  irbesartan   150 mg Oral Daily   metoprolol  tartrate  50 mg Oral BID   multivitamin  1 tablet Oral BID   multivitamin with minerals  1 tablet Oral Daily   mupirocin ointment  1 Application Nasal BID   Continuous Infusions:   LOS: 1 day    Time spent: 35 minutes    Rayden Scheper A Comfort Iversen, MD [facility]   If 7PM-7AM, please contact night-coverage [email or web address]  09/05/2023, 7:23 AM

## 2023-09-05 NOTE — Transfer of Care (Signed)
 Immediate Anesthesia Transfer of Care Note  Patient: James Ibarra  Procedure(s) Performed: ARTHROPLASTY, HIP, TOTAL, ANTERIOR APPROACH (Left: Hip)  Patient Location: PACU  Anesthesia Type:General  Level of Consciousness: drowsy  Airway & Oxygen Therapy: Patient Spontanous Breathing and Patient connected to face mask oxygen  Post-op Assessment: Report given to RN and Post -op Vital signs reviewed and stable  Post vital signs: Reviewed and stable  Last Vitals:  Vitals Value Taken Time  BP 181/113 09/05/23 15:52  Temp    Pulse 72 09/05/23 15:59  Resp 18 09/05/23 15:59  SpO2 92 % 09/05/23 15:59  Vitals shown include unfiled device data.  Last Pain:  Vitals:   09/05/23 1318  TempSrc:   PainSc: 2          Complications: No notable events documented.

## 2023-09-06 ENCOUNTER — Encounter (HOSPITAL_COMMUNITY): Payer: Self-pay | Admitting: Orthopaedic Surgery

## 2023-09-06 ENCOUNTER — Inpatient Hospital Stay (HOSPITAL_COMMUNITY)

## 2023-09-06 DIAGNOSIS — I739 Peripheral vascular disease, unspecified: Secondary | ICD-10-CM

## 2023-09-06 DIAGNOSIS — I6389 Other cerebral infarction: Secondary | ICD-10-CM

## 2023-09-06 DIAGNOSIS — Z8679 Personal history of other diseases of the circulatory system: Secondary | ICD-10-CM

## 2023-09-06 DIAGNOSIS — R569 Unspecified convulsions: Secondary | ICD-10-CM

## 2023-09-06 DIAGNOSIS — R55 Syncope and collapse: Secondary | ICD-10-CM

## 2023-09-06 DIAGNOSIS — S72012A Unspecified intracapsular fracture of left femur, initial encounter for closed fracture: Secondary | ICD-10-CM | POA: Diagnosis not present

## 2023-09-06 DIAGNOSIS — I69391 Dysphagia following cerebral infarction: Secondary | ICD-10-CM

## 2023-09-06 LAB — BASIC METABOLIC PANEL WITH GFR
Anion gap: 12 (ref 5–15)
BUN: 13 mg/dL (ref 8–23)
CO2: 25 mmol/L (ref 22–32)
Calcium: 8.5 mg/dL — ABNORMAL LOW (ref 8.9–10.3)
Chloride: 101 mmol/L (ref 98–111)
Creatinine, Ser: 0.74 mg/dL (ref 0.61–1.24)
GFR, Estimated: >60 mL/min (ref >60)
Glucose, Bld: 108 mg/dL — ABNORMAL HIGH (ref 70–99)
Potassium: 3 mmol/L — ABNORMAL LOW (ref 3.5–5.1)
Sodium: 138 mmol/L (ref 135–145)

## 2023-09-06 LAB — CBC
HCT: 36 % — ABNORMAL LOW (ref 39.0–52.0)
Hemoglobin: 12.2 g/dL — ABNORMAL LOW (ref 13.0–17.0)
MCH: 30.2 pg (ref 26.0–34.0)
MCHC: 33.9 g/dL (ref 30.0–36.0)
MCV: 89.1 fL (ref 80.0–100.0)
Platelets: 146 10*3/uL — ABNORMAL LOW (ref 150–400)
RBC: 4.04 MIL/uL — ABNORMAL LOW (ref 4.22–5.81)
RDW: 14 % (ref 11.5–15.5)
WBC: 12.8 10*3/uL — ABNORMAL HIGH (ref 4.0–10.5)
nRBC: 0 % (ref 0.0–0.2)

## 2023-09-06 LAB — LIPID PANEL
Cholesterol: 136 mg/dL (ref 0–200)
HDL: 59 mg/dL (ref >40)
LDL Cholesterol: 57 mg/dL (ref 0–99)
Total CHOL/HDL Ratio: 2.3 ratio
Triglycerides: 101 mg/dL (ref <150)
VLDL: 20 mg/dL (ref 0–40)

## 2023-09-06 LAB — CK: Total CK: 639 U/L — ABNORMAL HIGH (ref 49–397)

## 2023-09-06 MED ORDER — MELATONIN 3 MG PO TABS
3.0000 mg | ORAL_TABLET | Freq: Every day | ORAL | Status: DC
  Administered 2023-09-06 – 2023-09-09 (×4): 3 mg via ORAL
  Filled 2023-09-06 (×4): qty 1

## 2023-09-06 MED ORDER — THIAMINE HCL 100 MG/ML IJ SOLN: 100.0000 mg | Freq: Every day | INTRAMUSCULAR | Status: DC

## 2023-09-06 MED ORDER — POLYETHYLENE GLYCOL 3350 17 G PO PACK
17.0000 g | PACK | Freq: Two times a day (BID) | ORAL | Status: AC
  Administered 2023-09-06 – 2023-09-07 (×3): 17 g via ORAL
  Filled 2023-09-06 (×3): qty 1

## 2023-09-06 MED ORDER — POTASSIUM CHLORIDE CRYS ER 20 MEQ PO TBCR
40.0000 meq | EXTENDED_RELEASE_TABLET | Freq: Two times a day (BID) | ORAL | Status: AC
  Administered 2023-09-06 (×2): 40 meq via ORAL
  Filled 2023-09-06 (×2): qty 2

## 2023-09-06 MED ORDER — ASPIRIN 81 MG PO TBEC
81.0000 mg | DELAYED_RELEASE_TABLET | Freq: Every day | ORAL | Status: DC
  Administered 2023-09-06 – 2023-09-10 (×5): 81 mg via ORAL
  Filled 2023-09-06 (×5): qty 1

## 2023-09-06 MED ORDER — ENSURE PLUS HIGH PROTEIN PO LIQD
237.0000 mL | Freq: Two times a day (BID) | ORAL | Status: DC
  Administered 2023-09-06 – 2023-09-10 (×9): 237 mL via ORAL

## 2023-09-06 MED ORDER — IOHEXOL 350 MG/ML SOLN
75.0000 mL | Freq: Once | INTRAVENOUS | Status: AC | PRN
  Administered 2023-09-06: 75 mL via INTRAVENOUS

## 2023-09-06 MED ORDER — HALOPERIDOL LACTATE 5 MG/ML IJ SOLN: 1.0000 mg | Freq: Four times a day (QID) | INTRAMUSCULAR | Status: DC | PRN

## 2023-09-06 MED ORDER — QUETIAPINE FUMARATE 25 MG PO TABS
25.0000 mg | ORAL_TABLET | Freq: Every evening | ORAL | Status: DC | PRN
  Administered 2023-09-07 – 2023-09-09 (×3): 25 mg via ORAL
  Filled 2023-09-06 (×3): qty 1

## 2023-09-06 MED ORDER — ENOXAPARIN SODIUM 40 MG/0.4ML IJ SOSY: 40.0000 mg | PREFILLED_SYRINGE | INTRAMUSCULAR | 0 refills | Status: DC

## 2023-09-06 MED ORDER — THIAMINE HCL 100 MG/ML IJ SOLN
500.0000 mg | Freq: Three times a day (TID) | INTRAVENOUS | Status: AC
  Administered 2023-09-06 – 2023-09-08 (×6): 500 mg via INTRAVENOUS
  Filled 2023-09-06: qty 5
  Filled 2023-09-06 (×2): qty 500
  Filled 2023-09-06: qty 466.67
  Filled 2023-09-06: qty 5
  Filled 2023-09-06: qty 500
  Filled 2023-09-06: qty 5

## 2023-09-06 MED ORDER — THIAMINE HCL 100 MG/ML IJ SOLN
250.0000 mg | Freq: Every day | INTRAVENOUS | Status: DC
  Administered 2023-09-08 – 2023-09-10 (×3): 250 mg via INTRAVENOUS
  Filled 2023-09-06 (×3): qty 2.5

## 2023-09-06 MED ORDER — HYDROCODONE-ACETAMINOPHEN 5-325 MG PO TABS: 1.0000 | ORAL_TABLET | Freq: Four times a day (QID) | ORAL | 0 refills | Status: DC | PRN

## 2023-09-06 NOTE — TOC Initial Note (Addendum)
 Transition of Care [facility]) - Initial/Assessment Note    Patient Details  Name: James Ibarra MRN: [id number] Date of Birth: 1950-04-16  Transition of Care [facility]) CM/SW Contact:    Lauraine FORBES Saa, LCSW Phone Number: 09/06/2023, 4:03 PM  Clinical Narrative:                  4:03 PM CSW introduced self and role to patient's spouse, [facility] (patient is not currently fully oriented). CSW informed Holli of physical therapy recommendation of patient discharging to SNF. Holli was agreeable with recommendation and consented CSW to send referrals to SNFs within [facility]. Holli informed CSW that patient has history with [facility] and is not interested in patient returning. Holli confirmed HH history ([facility]). Holli stated that patient has DME at home. Per chart review, patient has a PCP and insurance. Patient's preferred pharmacy's are [facility] [address] [facility], and [facility]. Patient's spouse expressed interest in becoming HCPOA. CSW relayed HCPOA interest to medical team and request chaplain consult to be placed. TOC to offer substance use resources once patient is fully oriented.  Expected Discharge Plan: Skilled Nursing Facility Barriers to Discharge: Continued Medical Work up, English as a second language teacher, SNF Pending bed offer   Patient Goals and CMS Choice            Expected Discharge Plan and Services In-house Referral: Clinical Social Work   Post Acute Care Choice: Skilled Nursing Facility Living arrangements for the past 2 months: Single Family Home                                      Prior Living Arrangements/Services Living arrangements for the past 2 months: Single Family Home Lives with:: Spouse Patient language and need for interpreter reviewed:: Yes        Need for Family Participation in Patient Care: Yes (Comment)     Criminal Activity/Legal Involvement Pertinent to Current Situation/Hospitalization: No -  Comment as needed  Activities of Daily Living   ADL Screening (condition at time of admission) Independently performs ADLs?: Yes (appropriate for developmental age) Is the patient deaf or have difficulty hearing?: No Does the patient have difficulty seeing, even when wearing glasses/contacts?: No Does the patient have difficulty concentrating, remembering, or making decisions?: No  Permission Sought/Granted Permission sought to share information with : Family Supports, Oceanographer granted to share information with : No (Contact information on chart)  Share Information with NAME: Holli Willean Burns  Permission granted to share info w AGENCY: SNF  Permission granted to share info w Relationship: Spouse  Permission granted to share info w Contact Information: [phone]  Emotional Assessment Appearance:: Appears stated age Attitude/Demeanor/Rapport: Unable to Assess Affect (typically observed): Unable to Assess   Alcohol / Substance Use: Not Applicable Psych Involvement: No (comment)  Admission diagnosis:  Hypokalemia [E87.6] History of alcohol abuse [F10.11] Elevated lactic acid level [R79.89] Fall, initial encounter [W19.XXXA] Closed fracture of right hip, initial encounter (HCC) [S72.001A] Fall at home, initial encounter [W19.CHERENE, Y92.009] Patient Active Problem List   Diagnosis Date Noted   Malnutrition of moderate degree 09/05/2023   Subcapital fracture of neck of left femur, closed, initial encounter (HCC) 09/04/2023   Fall at home, initial encounter 09/04/2023   Left ankle injury, initial encounter 07/07/2023   Acute left ankle pain 07/07/2023   History of hypertension 07/07/2023   Hypercoagulable state due to  paroxysmal atrial fibrillation (HCC) 06/21/2023   S/P MVR (mitral valve repair) 12/08/2022   Protein-calorie malnutrition, severe 08/19/2022   Alcohol withdrawal (HCC) 08/14/2022   Acute metabolic encephalopathy 08/14/2022    Paroxysmal atrial fibrillation with RVR (HCC) 08/14/2022   Nonrheumatic mitral valve regurgitation 08/14/2022   Mitral valve prolapse 08/14/2022   PNA (pneumonia) 08/12/2022   Severe sepsis (HCC) 08/12/2022   HLD (hyperlipidemia) 08/12/2022   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 [place] - [address] [place] [address] Phone: [phone] Fax: [phone]  [facility]) - [place], [place] - [address] [facility] [address] [place] [address] [place] [address] Phone: [phone] Fax: [phone]  [facility] - [place], [place] - [address] [place]  [address] Phone: [phone] Fax: [phone]     Social Drivers of Health (SDOH) Social History: SDOH Screenings   Food Insecurity: No Food Insecurity (08/12/2022)  Housing: Low Risk  (08/12/2022)  Transportation Needs: No Transportation Needs (08/12/2022)  Utilities: Not At Risk (08/12/2022)  Tobacco Use: Low Risk  (09/05/2023)   SDOH Interventions:     Readmission Risk Interventions    12/14/2022   11:51 AM  Readmission Risk Prevention Plan  Transportation Screening Complete  PCP or Specialist Appt within 5-7 Days --  Home Care Screening Complete  Medication Review (RN CM) Complete

## 2023-09-06 NOTE — Progress Notes (Signed)
 Subjective: 1 Day Post-Op Procedure(s) (LRB): ARTHROPLASTY, HIP, TOTAL, ANTERIOR APPROACH (Left) Resting in bed. Not responding to my questions, but does not appear to be uncomfortable.    Objective: Vital signs in last 24 hours: Temp:  [97.5 F (36.4 C)-98.6 F (37 C)] 97.9 F (36.6 C) (07/03 0721) Pulse Rate:  [55-94] 90 (07/03 0721) Resp:  [11-22] 17 (07/03 0405) BP: (119-187)/(78-111) 138/80 (07/03 0721) SpO2:  [85 %-100 %] 94 % (07/03 0721) Weight:  [70.3 kg] 70.3 kg (07/02 1248)  Intake/Output from previous day: 07/02 0701 - 07/03 0700 In: 2000 [I.V.:1800; IV Piggyback:200] Out: 650 [Urine:450; Blood:200] Intake/Output this shift: No intake/output data recorded.  Recent Labs    09/04/23 1550 09/04/23 1604 09/05/23 0535 09/05/23 1739 09/06/23 0554  HGB 14.2 14.6 12.6* 12.3* 12.2*   Recent Labs    09/05/23 1739 09/06/23 0554  WBC 12.1* 12.8*  RBC 4.09* 4.04*  HCT 35.9* 36.0*  PLT 138* 146*   Recent Labs    09/05/23 0535 09/05/23 1739 09/06/23 0554  NA 137  --  138  K 3.0*  --  3.0*  CL 98  --  101  CO2 29  --  25  BUN 13  --  13  CREATININE 0.73 0.76 0.74  GLUCOSE 117*  --  108*  CALCIUM  8.5*  --  8.5*   Recent Labs    09/04/23 1550  INR 1.0    Neurovascular intact Intact pulses distally Incision: dressing C/D/I No cellulitis present Compartment soft   Assessment/Plan: 1 Day Post-Op Procedure(s) (LRB): ARTHROPLASTY, HIP, TOTAL, ANTERIOR APPROACH (Left) Up with therapy WBAT LLE Dvt ppx- lovenox  scds F/u with Adams Hinch 2 weeks post-op     Ronal LITTIE Grave 09/06/2023, 8:07 AM

## 2023-09-06 NOTE — Procedures (Signed)
 Patient Name: James Ibarra  MRN: [id number]  Epilepsy Attending: Arlin MALVA Krebs  Referring Physician/Provider: Rosemarie Eather RAMAN, MD  Date: 09/06/2023 Duration: 25.38 mins  Patient history: 73yo M with syncope. EEG to evaluate for seizure.  Level of alertness: Awake, asleep  AEDs during EEG study: None  Technical aspects: This EEG study was done with scalp electrodes positioned according to the 10-20 International system of electrode placement. Electrical activity was reviewed with band pass filter of 1-70Hz , sensitivity of 7 uV/mm, display speed of 42mm/sec with a 60Hz  notched filter applied as appropriate. EEG data were recorded continuously and digitally stored.  Video monitoring was available and reviewed as appropriate.  Description: The posterior dominant rhythm consists of 8 Hz activity of moderate voltage (25-35 uV) seen predominantly in posterior head regions, symmetric and reactive to eye opening and eye closing. Sleep was characterized by vertex waves, sleep spindles (12 to 14 Hz), maximal frontocentral region. Hyperventilation and photic stimulation were not performed.     IMPRESSION: This study is within normal limits. No seizures or epileptiform discharges were seen throughout the recording.  A normal interictal EEG does not exclude the diagnosis of epilepsy.   Aleane Wesenberg O Phares Zaccone

## 2023-09-06 NOTE — Progress Notes (Signed)
 [facility] PROGRESS NOTE    Progress Note  STEVEN BASSO  FMW:[id number] DOB: 1950-07-10 DOA: 09/04/2023 PCP: Kip Righter, MD     Brief Narrative:   James Ibarra is an 73 y.o. male past medical history alcohol abuse significant for mitral regurgitation repair, essential hypertension, CAD A-fib status post maze with left atrial appendage clipping on October 2024 was found to to be in the ground.  Was found to have a left hip fracture, CT scan of the abdomen pelvis showed left comminuted femoral fracture   Assessment/Plan:   Subcapital fracture of neck of left femur, closed, initial encounter [facility]) Orthopedic surgery was consulted. MRI done there was a concern for CVA neurology was consulted, they deemed this not to be a stroke, no reason to delay surgery. She is status post total hip replacement on the left. Analgesics and coagulation per orthopedic surgery. Started on MiraLAX  p.o. twice daily  PT OT evaluated the patient will need skilled nursing facility placement.  Syncope: Neurology was consulted, recommended risk and benefits are reasonable for the patient to proceed with left hip repair. HgbA1c pending, fasting lipid panel HDL greater than 40 LDL less than 60 PT, OT, for post surgical intervention CT of the head and neck pending Transthoracic Echo, pending  Abnormal EKG with elevated troponins: He is asymptomatic cardiology was consulted recommended to keep potassium greater than 3. Patient very functional can complete more than 4 METS his R CRI is 3%. They recommended no further recommendations   Acute confusional state: Try to minimize narcotics family is at bedside. Use melatonin at night, Seroquel  for agitation. High risk for sundowning and aspiration.  Essential hypertension: Continue metoprolol  and ARB.  Chronic diastolic dysfunction: Appears euvolemic.  CAD without angina: Continue metoprolol  and statins.  Traumatic rhabdomyolysis: Continue  IV fluids CK is trending down.  Malnutrition of moderate degree noted  DVT prophylaxis: lovenox  Family Communication:wife Status is: Inpatient Remains inpatient appropriate because: Subcapital femoral fracture    Code Status:     Code Status Orders  (From admission, onward)           Start     Ordered   09/04/23 1855  Full code  Continuous       Question:  By:  Answer:  Other   09/04/23 1856           Code Status History     Date Active Date Inactive Code Status Order ID Comments User Context   05/24/2023 1011 05/24/2023 1827 Full Code [id number]  Nancey Eulas BRAVO, MD Inpatient   12/08/2022 1116 12/14/2022 1657 Full Code [id number]  Lindia Laurel KANDICE DEVONNA Inpatient   10/12/2022 1239 10/12/2022 2007 Full Code [id number]  Wendel Lurena POUR, MD Inpatient   08/12/2022 1602 08/25/2022 2349 Full Code [id number]  Seena Marsa NOVAK, MD ED      Advance Directive Documentation    Flowsheet Row Most Recent Value  Type of Advance Directive Healthcare Power of Attorney, Living will  Pre-existing out of facility DNR order (yellow form or pink MOST form) --  MOST Form in Place? --      IV Access:   Peripheral IV   Procedures and diagnostic studies:   DG Pelvis Portable Result Date: 09/05/2023 CLINICAL DATA:  Status post left hip replacement. EXAM: PORTABLE PELVIS 1-2 VIEWS COMPARISON:  Preoperative imaging. FINDINGS: Left hip arthroplasty in expected alignment. No periprosthetic lucency. There is a small fracture arising from the greater trochanter that was not definitively seen on preoperative imaging.  Recent postsurgical change includes air and edema in the soft tissues. IMPRESSION: 1. Left hip arthroplasty in expected alignment. 2. Small fracture arising from the greater trochanter, not definitively seen on preoperative imaging. This does not abut the hardware. Electronically Signed   By: Andrea Gasman M.D.   On: 09/05/2023 22:09   VAS US  CAROTID Result Date:  09/05/2023 Carotid Arterial Duplex Study Patient Name:  James Ibarra  Date of Exam:   09/05/2023 Medical Rec #: [id number]        Accession #:    [id number] Date of Birth: [birth year]        Patient Gender: M Patient Age:   61 years Exam Location:  [facility] Procedure:      VAS US  CAROTID Referring Phys: EKTA PATEL --------------------------------------------------------------------------------  Indications:       Syncope and Weakness. Risk Factors:      Hypertension, hyperlipidemia, coronary artery disease. Other Factors:     12/08/22 - MV repair. Comparison Study:  12/06/22 - WNL Performing Technologist: Ricka Sturdivant-Jones RDMS, RVT  Examination Guidelines: A complete evaluation includes B-mode imaging, spectral Doppler, color Doppler, and power Doppler as needed of all accessible portions of each vessel. Bilateral testing is considered an integral part of a complete examination. Limited examinations for reoccurring indications may be performed as noted.  Right Carotid Findings: +----------+--------+--------+--------+------------------+------------------+           PSV cm/sEDV cm/sStenosisPlaque DescriptionComments           +----------+--------+--------+--------+------------------+------------------+ CCA Prox  106     13                                                   +----------+--------+--------+--------+------------------+------------------+ CCA Distal78      14                                intimal thickening +----------+--------+--------+--------+------------------+------------------+ ICA Prox  46      9                                 intimal thickening +----------+--------+--------+--------+------------------+------------------+ ICA Distal70      16                                                   +----------+--------+--------+--------+------------------+------------------+ ECA       99      14                                                    +----------+--------+--------+--------+------------------+------------------+ +----------+--------+-------+----------------+-------------------+           PSV cm/sEDV cmsDescribe        Arm Pressure (mmHG) +----------+--------+-------+----------------+-------------------+ [id number]             Multiphasic, WNL                    +----------+--------+-------+----------------+-------------------+ +---------+--------+--+--------+--+---------+ VertebralPSV cm/s50EDV cm/s12Antegrade +---------+--------+--+--------+--+---------+  Left Carotid Findings: +----------+--------+--------+--------+------------------+------------------+  PSV cm/sEDV cm/sStenosisPlaque DescriptionComments           +----------+--------+--------+--------+------------------+------------------+ CCA Prox  100     16                                                   +----------+--------+--------+--------+------------------+------------------+ CCA Distal64      14                                intimal thickening +----------+--------+--------+--------+------------------+------------------+ ICA Prox  46      13                                intimal thickening +----------+--------+--------+--------+------------------+------------------+ ICA Mid   72      23                                                   +----------+--------+--------+--------+------------------+------------------+ ICA Distal113     26                                tortuous           +----------+--------+--------+--------+------------------+------------------+ ECA       59      11                                                   +----------+--------+--------+--------+------------------+------------------+ +----------+--------+--------+----------------+-------------------+           PSV cm/sEDV cm/sDescribe        Arm Pressure (mmHG) +----------+--------+--------+----------------+-------------------+  [id number]             Multiphasic, WNL                    +----------+--------+--------+----------------+-------------------+ +---------+--------+--+--------+--+---------+ VertebralPSV cm/s41EDV cm/s11Antegrade +---------+--------+--+--------+--+---------+   Summary: Right Carotid: The extracranial vessels were near-normal with only minimal wall                thickening or plaque. Left Carotid: The extracranial vessels were near-normal with only minimal wall               thickening or plaque. Vertebrals:  Bilateral vertebral arteries demonstrate antegrade flow. Subclavians: Normal flow hemodynamics were seen in bilateral subclavian              arteries. *See table(s) above for measurements and observations.  Electronically signed by Gaile New MD on 09/05/2023 at 9:55:49 PM.    Final    DG HIP UNILAT WITH PELVIS 1V LEFT Result Date: 09/05/2023 CLINICAL DATA:  Elective surgery EXAM: DG HIP (WITH OR WITHOUT PELVIS) 1V*L* COMPARISON:  Preoperative imaging FINDINGS: Seven fluoroscopic spot views of the pelvis and left hip obtained in the operating room. Sequential images during hip arthroplasty. Fluoroscopy time 25 seconds. Dose 2.6 mGy. IMPRESSION: Intraoperative fluoroscopy during left hip arthroplasty. Electronically Signed   By: Andrea Gasman M.D.   On: 09/05/2023 15:39   DG  C-Arm 1-60 Min-No Report Result Date: 09/05/2023 Fluoroscopy was utilized by the requesting physician.  No radiographic interpretation.   DG C-Arm 1-60 Min-No Report Result Date: 09/05/2023 Fluoroscopy was utilized by the requesting physician.  No radiographic interpretation.   MR BRAIN WO CONTRAST Result Date: 09/04/2023 CLINICAL DATA:  Syncope, presyncope. EXAM: MRI HEAD WITHOUT CONTRAST TECHNIQUE: Multiplanar, multiecho pulse sequences of the brain and surrounding structures were obtained without intravenous contrast. COMPARISON:  Same day CT head. FINDINGS: Brain: Punctate focus of restricted diffusion in the  left hippocampus (series 5, image 72). No acute hemorrhage, hydrocephalus, extra-axial collection or mass lesion. Cerebral atrophy. 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 Vascular: Major arterial flow voids are maintained skull base. Skull and upper cervical spine: Normal marrow signal. Sinuses/Orbits: Clear sinuses.  Neck overall findings. IMPRESSION: Punctate focus of restricted diffusion in the left hippocampus, compatible with transient global amnesia versus acute infarct. Electronically Signed   By: Gilmore GORMAN Molt M.D.   On: 09/04/2023 23:43   DG Ankle 2 Views Right Result Date: 09/04/2023 CLINICAL DATA:  Pain after fall at home, initial encounter. EXAM: RIGHT ANKLE - 2 VIEW COMPARISON:  None Available. FINDINGS: Lateral view is limited by positioning. Allowing for this, no evidence of acute fracture. No dislocation. The ankle mortise is preserved on AP view. Cannot assess for joint effusion. No focal soft tissue abnormalities. IMPRESSION: No acute fracture or dislocation of the right ankle. Electronically Signed   By: Andrea Gasman M.D.   On: 09/04/2023 20:35   CT HEAD WO CONTRAST Result Date: 09/04/2023 CLINICAL DATA:  Provided history: Head trauma, moderate/severe. Polytrauma, blunt. EXAM: CT HEAD WITHOUT CONTRAST CT CERVICAL SPINE WITHOUT CONTRAST TECHNIQUE: Multidetector CT imaging of the head and cervical spine was performed following the standard protocol without intravenous contrast. Multiplanar CT image reconstructions of the cervical spine were also generated. RADIATION DOSE REDUCTION: This exam was performed according to the departmental dose-optimization program which includes automated exposure control, adjustment of the mA and/or kV according to patient size and/or use of iterative reconstruction technique. COMPARISON:  None. FINDINGS: CT HEAD FINDINGS Brain: Generalized cerebral atrophy. Prominence of the  ventricles and sulci, which appears commensurate. Patchy and ill-defined hypoattenuation within the cerebral white matter, nonspecific but compatible with mild chronic small vessel ischemic disease. Mega cisterna magna (anatomic variant). There is no acute intracranial hemorrhage. No demarcated cortical infarct. No extra-axial fluid collection. No evidence of an intracranial mass. No midline shift.  a Vascular: No hyperdense vessel.  Atherosclerotic calcifications. Skull: No calvarial fracture or aggressive osseous lesion. Sinuses/Orbits: No mass or acute finding within the imaged orbits. Left scleral buckle. Prior bilateral ocular lens replacement. No significant paranasal sinus disease. CT CERVICAL SPINE FINDINGS Alignment: Dextrocurvature of the cervical spine. Levocurvature of the visible upper thoracic spine. 2 mm C7-T1 grade 1 anterolisthesis. Skull base and vertebrae: The basion-dental and atlanto-dental intervals are maintained.No evidence of acute fracture to the cervical spine. Soft tissues and spinal canal: No prevertebral fluid or swelling. No visible canal hematoma. Disc levels: Cervical spondylosis with multilevel disc space narrowing, disc bulges/central disc protrusions, endplate spurring and uncovertebral hypertrophy. Facet arthropathy on the left at C7-T1. No appreciable high-grade spinal canal stenosis. Multilevel bony neural foraminal narrowing, greatest on the left at C3-C4, C4-C5 and C5-C6. Degenerative changes also present at the C1-C2 articulation. Upper chest: No consolidation within the imaged lung apices. No visible pneumothorax. IMPRESSION: CT head: 1. No evidence of an acute intracranial  abnormality. 2. Parenchymal atrophy and chronic small vessel ischemic disease. CT cervical spine: 1. No evidence of an acute cervical spine fracture. 2. 2 mm C7-T1 grade 1 anterolisthesis. 3. Dextrocurvature of the cervical spine. 4. Cervical spondylosis as described. Electronically Signed   By: Rockey Childs D.O.   On: 09/04/2023 17:01   CT CERVICAL SPINE WO CONTRAST Result Date: 09/04/2023 CLINICAL DATA:  Provided history: Head trauma, moderate/severe. Polytrauma, blunt. EXAM: CT HEAD WITHOUT CONTRAST CT CERVICAL SPINE WITHOUT CONTRAST TECHNIQUE: Multidetector CT imaging of the head and cervical spine was performed following the standard protocol without intravenous contrast. Multiplanar CT image reconstructions of the cervical spine were also generated. RADIATION DOSE REDUCTION: This exam was performed according to the departmental dose-optimization program which includes automated exposure control, adjustment of the mA and/or kV according to patient size and/or use of iterative reconstruction technique. COMPARISON:  None. FINDINGS: CT HEAD FINDINGS Brain: Generalized cerebral atrophy. Prominence of the ventricles and sulci, which appears commensurate. Patchy and ill-defined hypoattenuation within the cerebral white matter, nonspecific but compatible with mild chronic small vessel ischemic disease. Mega cisterna magna (anatomic variant). There is no acute intracranial hemorrhage. No demarcated cortical infarct. No extra-axial fluid collection. No evidence of an intracranial mass. No midline shift.  a Vascular: No hyperdense vessel.  Atherosclerotic calcifications. Skull: No calvarial fracture or aggressive osseous lesion. Sinuses/Orbits: No mass or acute finding within the imaged orbits. Left scleral buckle. Prior bilateral ocular lens replacement. No significant paranasal sinus disease. CT CERVICAL SPINE FINDINGS Alignment: Dextrocurvature of the cervical spine. Levocurvature of the visible upper thoracic spine. 2 mm C7-T1 grade 1 anterolisthesis. Skull base and vertebrae: The basion-dental and atlanto-dental intervals are maintained.No evidence of acute fracture to the cervical spine. Soft tissues and spinal canal: No prevertebral fluid or swelling. No visible canal hematoma. Disc levels: Cervical  spondylosis with multilevel disc space narrowing, disc bulges/central disc protrusions, endplate spurring and uncovertebral hypertrophy. Facet arthropathy on the left at C7-T1. No appreciable high-grade spinal canal stenosis. Multilevel bony neural foraminal narrowing, greatest on the left at C3-C4, C4-C5 and C5-C6. Degenerative changes also present at the C1-C2 articulation. Upper chest: No consolidation within the imaged lung apices. No visible pneumothorax. IMPRESSION: CT head: 1. No evidence of an acute intracranial abnormality. 2. Parenchymal atrophy and chronic small vessel ischemic disease. CT cervical spine: 1. No evidence of an acute cervical spine fracture. 2. 2 mm C7-T1 grade 1 anterolisthesis. 3. Dextrocurvature of the cervical spine. 4. Cervical spondylosis as described. Electronically Signed   By: Rockey Childs D.O.   On: 09/04/2023 17:01   CT CHEST ABDOMEN PELVIS W CONTRAST Result Date: 09/04/2023 CLINICAL DATA:  Found down, fell, hip injury EXAM: CT CHEST, ABDOMEN, AND PELVIS WITH CONTRAST TECHNIQUE: Multidetector CT imaging of the chest, abdomen and pelvis was performed following the standard protocol during bolus administration of intravenous contrast. RADIATION DOSE REDUCTION: This exam was performed according to the departmental dose-optimization program which includes automated exposure control, adjustment of the mA and/or kV according to patient size and/or use of iterative reconstruction technique. CONTRAST:  75mL OMNIPAQUE  IOHEXOL  350 MG/ML SOLN COMPARISON:  09/04/2023, 05/03/2023 FINDINGS: CT CHEST FINDINGS Cardiovascular: Postsurgical changes from median sternotomy and mitral valve replacement. Surgical clip across the left atrial appendage. Heart is otherwise unremarkable without pericardial effusion. No evidence of vascular injury. No evidence of thoracic aortic aneurysm or dissection. Atherosclerosis of the aortic arch. Mediastinum/Nodes: No enlarged mediastinal, hilar, or axillary  lymph nodes. Thyroid  gland, trachea, and esophagus demonstrate no  significant findings. Lungs/Pleura: No acute airspace disease, effusion, or pneumothorax. Minimal hypoventilatory changes or subpleural scarring within the dependent lower lobes. Central airways are patent. Musculoskeletal: Prior median sternotomy. No acute or destructive bony abnormalities. Reconstructed images demonstrate no additional findings. CT ABDOMEN PELVIS FINDINGS Hepatobiliary: No hepatic injury or perihepatic hematoma. Gallbladder is unremarkable. Pancreas: Unremarkable. No pancreatic ductal dilatation or surrounding inflammatory changes. Spleen: No splenic injury or perisplenic hematoma. Adrenals/Urinary Tract: No adrenal hemorrhage or renal injury identified. Bladder is unremarkable. Stomach/Bowel: No bowel obstruction or ileus. Normal appendix right lower quadrant. Diverticulosis of the descending and sigmoid colon without diverticulitis. No bowel wall thickening or inflammatory change. Vascular/Lymphatic: Aortic atherosclerosis. No enlarged abdominal or pelvic lymph nodes. Reproductive: Prostate is unremarkable. Other: No free fluid or free intraperitoneal gas. No abdominal wall hernia. Musculoskeletal: Comminuted impacted subcapital left femoral neck fracture is identified, with ventral and varus angulation at the fracture site. No dislocation. No other acute displaced fractures. Moderate soft tissue swelling surrounding the left hip fracture. Reconstructed images demonstrate no additional findings. IMPRESSION: 1. Comminuted subcapital left femoral neck fracture. 2. Otherwise no acute intrathoracic, intra-abdominal, or intrapelvic trauma. 3.  Aortic Atherosclerosis (ICD10-I70.0). 4. Distal colonic diverticulosis without diverticulitis. Electronically Signed   By: Ozell Daring M.D.   On: 09/04/2023 16:49   DG Pelvis Portable Result Date: 09/04/2023 CLINICAL DATA:  Trauma fall EXAM: PORTABLE PELVIS 1-2 VIEWS COMPARISON:  None  Available. FINDINGS: SI joints are non widened. Pubic symphysis and rami appear intact. Acute displaced left femoral neck fracture. No femoral head dislocation IMPRESSION: Acute displaced left femoral neck fracture. Electronically Signed   By: Luke Bun M.D.   On: 09/04/2023 15:58   DG Chest Port 1 View Result Date: 09/04/2023 CLINICAL DATA:  Trauma fall EXAM: PORTABLE CHEST 1 VIEW COMPARISON:  12/26/2022 FINDINGS: Post sternotomy changes with valve prosthesis and atrial appendage clip. Hyperinflated lungs. No focal opacity, pleural effusion, or pneumothorax. IMPRESSION: No active disease. Hyperinflated lungs. Electronically Signed   By: Luke Bun M.D.   On: 09/04/2023 15:57     Medical Consultants:   None.   Subjective:    ADREAN HEITZ in pain this morning, confused  Objective:    Vitals:   09/05/23 2358 09/06/23 0121 09/06/23 0405 09/06/23 0721  BP: (!) 186/78 (!) 169/83 (!) 166/81 138/80  Pulse: 75 83 94 90  Resp: 17  17   Temp: (!) 97.5 F (36.4 C)  98.4 F (36.9 C) 97.9 F (36.6 C)  TempSrc: Axillary  Rectal   SpO2: 95%  95% 94%  Weight:      Height:       SpO2: 94 %   Intake/Output Summary (Last 24 hours) at 09/06/2023 0933 Last data filed at 09/05/2023 1640 Gross per 24 hour  Intake 2000 ml  Output 450 ml  Net 1550 ml   Filed Weights   09/04/23 1516 09/05/23 1248  Weight: 73 kg 70.3 kg    Exam: General exam: In no acute distress. Respiratory system: Good air movement and clear to auscultation. Cardiovascular system: S1 & S2 heard, RRR. No JVD. Gastrointestinal system: Abdomen is nondistended, soft and nontender.  Extremities: No pedal edema. Skin: No rashes, lesions or ulcers Psychiatry: Confused   Data Reviewed:    Labs: Basic Metabolic Panel: Recent Labs  Lab 09/04/23 1550 09/04/23 1604 09/05/23 0535 09/05/23 1739 09/06/23 0554  NA 138 141 137  --  138  K 3.2* 3.2* 3.0*  --  3.0*  CL 101 103 98  --  101  CO2 24  --  29  --  25   GLUCOSE 127* 125* 117*  --  108*  BUN 16 18 13   --  13  CREATININE 0.84 0.70 0.73 0.76 0.74  CALCIUM  8.9  --  8.5*  --  8.5*  MG 2.0  --   --   --   --    GFR Estimated Creatinine Clearance: 83 mL/min (by C-G formula based on SCr of 0.74 mg/dL). Liver Function Tests: Recent Labs  Lab 09/04/23 1550  AST 46*  ALT 26  ALKPHOS 62  BILITOT 2.3*  PROT 6.6  ALBUMIN  3.8   No results for input(s): LIPASE, AMYLASE in the last 168 hours. No results for input(s): AMMONIA in the last 168 hours. Coagulation profile Recent Labs  Lab 09/04/23 1550  INR 1.0   COVID-19 Labs  No results for input(s): DDIMER, FERRITIN, LDH, CRP in the last 72 hours.  Lab Results  Component Value Date   SARSCOV2NAA NEGATIVE 12/06/2022   SARSCOV2NAA NEGATIVE 08/12/2022    CBC: Recent Labs  Lab 09/04/23 1550 09/04/23 1604 09/05/23 0535 09/05/23 1739 09/06/23 0554  WBC 15.4*  --  10.5 12.1* 12.8*  HGB 14.2 14.6 12.6* 12.3* 12.2*  HCT 41.3 43.0 36.4* 35.9* 36.0*  MCV 88.1  --  88.6 87.8 89.1  PLT 194  --  156 138* 146*   Cardiac Enzymes: Recent Labs  Lab 09/04/23 1550  CKTOTAL 780*   BNP (last 3 results) No results for input(s): PROBNP in the last 8760 hours. CBG: No results for input(s): GLUCAP in the last 168 hours. D-Dimer: No results for input(s): DDIMER in the last 72 hours. Hgb A1c: No results for input(s): HGBA1C in the last 72 hours. Lipid Profile: Recent Labs    09/06/23 0817  CHOL 136  HDL 59  LDLCALC 57  TRIG 101  CHOLHDL 2.3   Thyroid  function studies: No results for input(s): TSH, T4TOTAL, T3FREE, THYROIDAB in the last 72 hours.  Invalid input(s): FREET3 Anemia work up: No results for input(s): VITAMINB12, FOLATE, FERRITIN, TIBC, IRON , RETICCTPCT in the last 72 hours. Sepsis Labs: Recent Labs  Lab 09/04/23 1550 09/04/23 1604 09/05/23 0535 09/05/23 1739 09/06/23 0554  WBC 15.4*  --  10.5 12.1* 12.8*   LATICACIDVEN  --  3.0*  --   --   --    Microbiology Recent Results (from the past 240 hours)  Surgical PCR screen     Status: None   Collection Time: 09/05/23 12:13 AM   Specimen: Nasal Mucosa; Nasal Swab  Result Value Ref Range Status   MRSA, PCR NEGATIVE NEGATIVE Final   Staphylococcus aureus NEGATIVE NEGATIVE Final    Comment: (NOTE) The Xpert SA Assay (FDA approved for NASAL specimens in patients 28 years of age and older), is one component of a comprehensive surveillance program. It is not intended to diagnose infection nor to guide or monitor treatment. Performed at [facility], [address] [place],  [address]      Medications:    acetaminophen   1,000 mg Oral Q6H   docusate sodium   100 mg Oral BID   enoxaparin  (LOVENOX ) injection  40 mg Subcutaneous Q24H   feeding supplement  237 mL Oral BID BM   irbesartan   150 mg Oral Daily   metoprolol  tartrate  50 mg Oral BID   multivitamin  1 tablet Oral BID   multivitamin with minerals  1 tablet Oral Daily   mupirocin ointment  1 Application  Nasal BID   rosuvastatin   20 mg Oral Daily   tranexamic acid  (CYKLOKAPRON ) 2,000 mg in sodium chloride  0.9 % 50 mL Topical Application  2,000 mg Topical Once   Continuous Infusions:    LOS: 2 days   Erle Odell Castor  [facility]  09/06/2023, 9:33 AM

## 2023-09-06 NOTE — Progress Notes (Signed)
 EEG complete - results pending

## 2023-09-06 NOTE — Evaluation (Addendum)
 Physical Therapy Evaluation Patient Details Name: James Ibarra MRN: [id number] DOB: 1951/01/29 Today's Date: 09/06/2023  History of Present Illness  Pt is a 73 y.o. male admitted 7/1 after being found down at home. 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 Xray revealed L hip fx. He underwent L THA 7/2. PMH:  MR s/p repair (Oct 2024), HTN, HLD, CAD, a fib, ETOH abuse   Clinical Impression  Pt admitted with above diagnosis. PTA pt lived at home with his wife, independent with mobility/ADLs. Pt currently with functional limitations due to the deficits listed below (see PT Problem List). On eval, pt required max assist bed mobility, and mod assist to maintain balance EOB. Pt received ativan  this AM resulting in lethargy and inability to fully participate. Unable to safely progress OOB requiring return to supine. Pt will benefit from acute skilled PT to increase their independence and safety with mobility to allow discharge.  Post acute, pt would benefit from further therapy in inpatient setting < 3 hours/day.         If plan is discharge home, recommend the following: Two people to help with walking and/or transfers;Two people to help with bathing/dressing/bathroom   Can travel by private vehicle   No    Equipment Recommendations Other (comment) (TBD)  Recommendations for Other Services       Functional Status Assessment Patient has had a recent decline in their functional status and demonstrates the ability to make significant improvements in function in a reasonable and predictable amount of time.     Precautions / Restrictions Precautions Precautions: Fall Recall of Precautions/Restrictions: Impaired Restrictions Weight Bearing Restrictions Per Provider Order: Yes LLE Weight Bearing Per Provider Order: Weight bearing as tolerated Other Position/Activity Restrictions: no ROM  restrictions      Mobility  Bed Mobility Overal bed mobility: Needs Assistance Bed Mobility: Supine to Sit, Sit to Supine     Supine to sit: Max assist Sit to supine: Max assist   General bed mobility comments: little to no active participation from pt, lethargic, not following commands    Transfers                   General transfer comment: unable to safely progress beyond EOB    Ambulation/Gait                  Stairs            Wheelchair Mobility     Tilt Bed    Modified Rankin (Stroke Patients Only)       Balance Overall balance assessment: Needs assistance Sitting-balance support: Bilateral upper extremity supported Sitting balance-Leahy Scale: Poor Sitting balance - Comments: mod assist to maintain sitting balance EOB Postural control: Right lateral lean                                   Pertinent Vitals/Pain Pain Assessment Pain Assessment: Faces Faces Pain Scale: Hurts little more Pain Location: LLE with mobility Pain Descriptors / Indicators: Moaning, Guarding, Grimacing Pain Intervention(s): Limited activity within patient's tolerance, Repositioned    Home Living                          Prior Function  Extremity/Trunk Assessment   Upper Extremity Assessment Upper Extremity Assessment: Generalized weakness    Lower Extremity Assessment Lower Extremity Assessment: Generalized weakness;LLE deficits/detail LLE Deficits / Details: s/p THA    Cervical / Trunk Assessment Cervical / Trunk Assessment: Kyphotic  Communication   Communication Communication: Impaired Factors Affecting Communication: Difficulty expressing self;Reduced clarity of speech    Cognition Arousal: Lethargic Behavior During Therapy: Flat affect   PT - Cognitive impairments: Difficult to assess Difficult to assess due to: Level of arousal                     PT - Cognition  Comments: Pt received ativan  this morning due to agitation and restlessness. Primarily moaning. Not answering questions. Eyes closed. Following commands: Impaired (not following commands)       Cueing       General Comments General comments (skin integrity, edema, etc.): HR in 90s. SpO2 stable on RA.    Exercises     Assessment/Plan    PT Assessment Patient needs continued PT services  PT Problem List Decreased strength;Pain;Decreased cognition;Decreased activity tolerance;Decreased knowledge of use of DME;Decreased balance;Decreased safety awareness;Decreased mobility;Decreased knowledge of precautions       PT Treatment Interventions DME instruction;Therapeutic exercise;Gait training;Balance training;Functional mobility training;Therapeutic activities;Patient/family education;Cognitive remediation    PT Goals (Current goals can be found in the Care Plan section)  Acute Rehab PT Goals Patient Stated Goal: unable to state PT Goal Formulation: Patient unable to participate in goal setting Time For Goal Achievement: 09/20/23 Potential to Achieve Goals: Good    Frequency Min 2X/week     Co-evaluation               AM-PAC PT 6 Clicks Mobility  Outcome Measure Help needed turning from your back to your side while in a flat bed without using bedrails?: A Lot Help needed moving from lying on your back to sitting on the side of a flat bed without using bedrails?: Total Help needed moving to and from a bed to a chair (including a wheelchair)?: Total Help needed standing up from a chair using your arms (e.g., wheelchair or bedside chair)?: Total Help needed to walk in hospital room?: Total Help needed climbing 3-5 steps with a railing? : Total 6 Click Score: 7    End of Session Equipment Utilized During Treatment: Gait belt Activity Tolerance: Patient limited by lethargy Patient left: in bed;with call bell/phone within reach;with bed alarm set Nurse Communication:  Mobility status PT Visit Diagnosis: Other abnormalities of gait and mobility (R26.89);Pain Pain - Right/Left: Left Pain - part of body: Hip    Time: 9182-9165 PT Time Calculation (min) (ACUTE ONLY): 17 min   Charges:   PT Evaluation $PT Eval Moderate Complexity: 1 Mod   PT General Charges $$ ACUTE PT VISIT: 1 Visit         Sari MATSU., PT  Office # [phone]   Erven Sari Shaker 09/06/2023, 12:36 PM

## 2023-09-06 NOTE — NC FL2 (Signed)
 [place]  MEDICAID FL2 LEVEL OF CARE FORM     IDENTIFICATION  Patient Name: James Ibarra Birthdate: 1950/05/25 Sex: male Admission Date (Current Location): 09/04/2023  [facility] and [place] Number:  Producer, television/film/video and Address:  [facility], [address] [place], [place] [address]      Provider Number: [id number]  Attending Physician Name and Address:  Odell Celinda Balo, MD  Relative Name and Phone Number:  [facility]; Spouse; [phone]    Current Level of Care: Hospital Recommended Level of Care: Skilled Nursing Facility Prior Approval Number:    Date Approved/Denied:   PASRR Number: [id number]  Discharge Plan: SNF    Current Diagnoses: Patient Active Problem List   Diagnosis Date Noted   Malnutrition of moderate degree 09/05/2023   Subcapital fracture of neck of left femur, closed, initial encounter (HCC) 09/04/2023   Fall at home, initial encounter 09/04/2023   Left ankle injury, initial encounter 07/07/2023   Acute left ankle pain 07/07/2023   History of hypertension 07/07/2023   Hypercoagulable state due to paroxysmal atrial fibrillation (HCC) 06/21/2023   S/P MVR (mitral valve repair) 12/08/2022   Protein-calorie malnutrition, severe 08/19/2022   Alcohol withdrawal (HCC) 08/14/2022   Acute metabolic encephalopathy 08/14/2022   Paroxysmal atrial fibrillation with RVR (HCC) 08/14/2022   Nonrheumatic mitral valve regurgitation 08/14/2022   Mitral valve prolapse 08/14/2022   PNA (pneumonia) 08/12/2022   Severe sepsis (HCC) 08/12/2022   HLD (hyperlipidemia) 08/12/2022   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   Trigeminal neuralgia of left side of face 07/15/2019   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   Hypertension 10/31/2013   Pain in joint, shoulder region 04/14/2013   Nodule of right lung 11/07/2011    Orientation RESPIRATION BLADDER Height & Weight        Normal (Room Air) Continent  Weight: 155 lb (70.3 kg) Height:  6' 1 (185.4 cm)  BEHAVIORAL SYMPTOMS/MOOD NEUROLOGICAL BOWEL NUTRITION STATUS      Continent Diet (Please see discharge summary)  AMBULATORY STATUS COMMUNICATION OF NEEDS Skin   Extensive Assist Verbally Surgical wounds (Closed Surgical Incision Hip Left)                       Personal Care Assistance Level of Assistance  Bathing, Dressing, Feeding Bathing Assistance: Maximum assistance Feeding assistance: Maximum assistance Dressing Assistance: Maximum assistance     Functional Limitations Info             SPECIAL CARE FACTORS FREQUENCY  PT (By licensed PT), OT (By licensed OT)     PT Frequency: 5x OT Frequency: 5x            Contractures Contractures Info: Not present    Additional Factors Info  Code Status, Allergies, Psychotropic, Insulin  Sliding Scale Code Status Info: Full Code Allergies Info: NKA Psychotropic Info: Please see discharge summary Insulin  Sliding Scale Info: Please see discharge summary       Current Medications (09/06/2023):  This is the current hospital active medication list Current Facility-Administered Medications  Medication Dose Route Frequency Provider Last Rate Last Admin   acetaminophen  (TYLENOL ) tablet 1,000 mg  1,000 mg Oral Q6H Jerri Kay HERO, MD   1,000 mg at 09/06/23 1223   acetaminophen  (TYLENOL ) tablet 325-650 mg  325-650 mg Oral Q6H PRN Jerri Kay HERO, MD       alum & mag hydroxide-simeth (MAALOX/MYLANTA) 200-200-20 MG/5ML suspension 30 mL  30 mL  Oral Q4H PRN Jerri Kay HERO, MD       aspirin  EC tablet 81 mg  81 mg Oral Daily Remi Pippin, NP   81 mg at 09/06/23 1511   docusate sodium  (COLACE) capsule 100 mg  100 mg Oral BID Jerri Kay HERO, MD   100 mg at 09/06/23 9160   enoxaparin  (LOVENOX ) injection 40 mg  40 mg Subcutaneous Q24H Jerri Kay HERO, MD   40 mg at 09/06/23 1511   feeding supplement (ENSURE PLUS HIGH PROTEIN) liquid 237 mL  237 mL Oral BID BM Odell Celinda Balo, MD   237 mL at  09/06/23 1515   hydrALAZINE  (APRESOLINE ) injection 10 mg  10 mg Intravenous Q6H PRN Jerri Kay HERO, MD   10 mg at 09/06/23 0019   HYDROmorphone  (DILAUDID ) injection 0.5-1 mg  0.5-1 mg Intravenous Q4H PRN Jerri Kay HERO, MD   1 mg at 09/06/23 1519   irbesartan  (AVAPRO ) tablet 150 mg  150 mg Oral Daily Jerri Kay HERO, MD   150 mg at 09/06/23 9160   LORazepam  (ATIVAN ) tablet 1-4 mg  1-4 mg Oral Q1H PRN Jerri Kay HERO, MD   2 mg at 09/05/23 2106   Or   LORazepam  (ATIVAN ) injection 1-4 mg  1-4 mg Intravenous Q1H PRN Jerri Kay HERO, MD   2 mg at 09/06/23 0655   magnesium  citrate solution 1 Bottle  1 Bottle Oral Once PRN Jerri Kay HERO, MD       melatonin tablet 3 mg  3 mg Oral QHS Odell Celinda Balo, MD       menthol-cetylpyridinium (CEPACOL) lozenge 3 mg  1 lozenge Oral PRN Jerri Kay HERO, MD       Or   phenol (CHLORASEPTIC) mouth spray 1 spray  1 spray Mouth/Throat PRN Jerri Kay HERO, MD       methocarbamol (ROBAXIN) tablet 500 mg  500 mg Oral Q6H PRN Jerri Kay HERO, MD   500 mg at 09/06/23 1511   Or   methocarbamol (ROBAXIN) injection 500 mg  500 mg Intravenous Q6H PRN Jerri Kay HERO, MD       metoprolol  tartrate (LOPRESSOR ) tablet 50 mg  50 mg Oral BID Jerri Kay HERO, MD   50 mg at 09/06/23 0840   multivitamin (PROSIGHT) tablet 1 tablet  1 tablet Oral BID Jerri Kay HERO, MD   1 tablet at 09/06/23 0840   multivitamin with minerals tablet 1 tablet  1 tablet Oral Daily Jerri Kay HERO, MD   1 tablet at 09/06/23 0911   mupirocin ointment (BACTROBAN) 2 % 1 Application  1 Application Nasal BID Jerri Kay HERO, MD   1 Application at 09/06/23 9157   ondansetron  (ZOFRAN ) tablet 4 mg  4 mg Oral Q6H PRN Jerri Kay HERO, MD       Or   ondansetron  (ZOFRAN ) injection 4 mg  4 mg Intravenous Q6H PRN Jerri Kay HERO, MD       oxyCODONE  (Oxy IR/ROXICODONE ) immediate release tablet 10-15 mg  10-15 mg Oral Q4H PRN Jerri Kay HERO, MD   10 mg at 09/06/23 1304   oxyCODONE  (Oxy IR/ROXICODONE ) immediate release tablet 5-10 mg  5-10 mg  Oral Q4H PRN Jerri Kay HERO, MD       polyethylene glycol (MIRALAX  / GLYCOLAX ) packet 17 g  17 g Oral Daily PRN Jerri Kay HERO, MD   17 g at 09/06/23 0839   polyethylene glycol (MIRALAX  / GLYCOLAX ) packet 17 g  17 g Oral BID Odell Celinda,  Erle, MD       potassium chloride  SA (KLOR-CON  M) CR tablet 40 mEq  40 mEq Oral BID Odell Celinda Erle, MD   40 mEq at 09/06/23 1304   QUEtiapine  (SEROQUEL ) tablet 25 mg  25 mg Oral QHS PRN Odell Celinda Erle, MD       rosuvastatin  (CRESTOR ) tablet 20 mg  20 mg Oral Daily Jerri Kay HERO, MD   20 mg at 09/06/23 0839   sorbitol 70 % solution 30 mL  30 mL Oral Daily PRN Jerri Kay HERO, MD       thiamine  (VITAMIN B1) 500 mg in sodium chloride  0.9 % 50 mL IVPB  500 mg Intravenous Q8H Remi Pippin, NP 110 mL/hr at 09/06/23 1223 500 mg at 09/06/23 1223   Followed by   NOREEN ON 09/08/2023] thiamine  (VITAMIN B1) 250 mg in sodium chloride  0.9 % 50 mL IVPB  250 mg Intravenous Daily Remi Pippin, NP       Followed by   NOREEN ON 09/14/2023] thiamine  (VITAMIN B1) injection 100 mg  100 mg Intravenous Daily Remi Pippin, NP       tranexamic acid  (CYKLOKAPRON ) 2,000 mg in sodium chloride  0.9 % 50 mL Topical Application  2,000 mg Topical Once Jerri Kay HERO, MD         Discharge Medications: Please see discharge summary for a list of discharge medications.  Relevant Imaging Results:  Relevant Lab Results:   Additional Information SS# [id number]  Lauraine FORBES Saa, LCSW

## 2023-09-06 NOTE — Evaluation (Signed)
 Occupational Therapy Evaluation Patient Details Name: James Ibarra MRN: [id number] DOB: 1950-04-08 Today's Date: 09/06/2023   History of Present Illness   Pt is a 73 y.o. male admitted 7/1 after being found down at home. 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 Xray revealed L hip fx. He underwent L THA 7/2. PMH:  MR s/p repair (Oct 2024), HTN, HLD, CAD, a fib, ETOH abuse     Clinical Impressions At baseline pt is Independent to Mod I with ADLs and functional mobility. Pt wife reports pt has required increasing assistance from family for IADLs, including medication and financial management since 12/2022. Pt's wife also describes slow decline in pt's overall cognitive level over past approx. 2 years with an an increased decline and increased speed of decline in cognition since 12/2022 and, most noticeably, within the past three months. Wife also reports pt has recently spent much of his time in bed and reports pt has seemed depressed and not interested in anything. Pt now presents with increased lethargy, increased fatigue, decreased activity tolerance, decreased cognition, generalized B UE weakness, decreased B UE coordination and sensation, and decreased safety and independence with functional tasks. Pt currently largely requiring Max to Total assist +1 to +2 with cues for initiation, sequencing, sustained attention, and sustained participation, all from bed level. Pt VSS on RA throughout session. Pt will benefit from acute skilled OT services to address deficits outlined below and to increase safety and independence with functional tasks, decreased risk of falls, decreased caregiver burden, and decreased risk of rehospitalization. Post acute discharge, pt will benefit from intensive inpatient skilled rehab services < 3 hours per day to maximize rehab potential.     If plan is discharge home,  recommend the following:   Two people to help with walking and/or transfers;Two people to help with bathing/dressing/bathroom;Assistance with cooking/housework;Assistance with feeding;Direct supervision/assist for medications management;Direct supervision/assist for financial management;Assist for transportation;Help with stairs or ramp for entrance;Supervision due to cognitive status     Functional Status Assessment   Patient has had a recent decline in their functional status and demonstrates the ability to make significant improvements in function in a reasonable and predictable amount of time.     Equipment Recommendations   Wheelchair (measurements OT);Wheelchair cushion (measurements OT);Other (comment) (TBD based on pt progress)     Recommendations for Other Services   Other (comment);Speech consult (Speech consult for cognition; Psychiatric consult)     Precautions/Restrictions   Precautions Precautions: Fall Recall of Precautions/Restrictions: Impaired Restrictions Weight Bearing Restrictions Per Provider Order: Yes LLE Weight Bearing Per Provider Order: Weight bearing as tolerated Other Position/Activity Restrictions: no ROM restrictions     Mobility Bed Mobility Overal bed mobility: Needs Assistance Bed Mobility: Rolling Rolling: Mod assist, Modified independent (Device/Increase time)         General bed mobility comments: Pt requiring Mod assist to roll when OT direceted and demonstrates ability to spontaniously roll with Mod I; sitting EOB deferred this session for pt therapist safety due to pt lethargy and confusion    Transfers                   General transfer comment: deferred this session for pt therapist safety due to pt lethargy and confusion      Balance  ADL either performed or assessed with clinical judgement   ADL Overall ADL's : Needs assistance/impaired    Eating/Feeding Details (indicate cue type and reason): deferred due to lethargy Grooming: Wash/dry hands;Wash/dry face;Maximal assistance;Bed level;Cueing for sequencing (cues for initiation) Grooming Details (indicate cue type and reason): pt joined in task with cues once therapist initated Upper Body Bathing: Maximal assistance;Total assistance;Bed level (cues for initiation)   Lower Body Bathing: Total assistance;+2 for safety/equipment;Bed level   Upper Body Dressing : Maximal assistance;Bed level;Cueing for sequencing (cues for intiation)   Lower Body Dressing: Total assistance;+2 for safety/equipment;Bed level     Toilet Transfer Details (indicate cue type and reason): deferred this session for pt therapist safety due to pt lethargy and confusion Toileting- Clothing Manipulation and Hygiene: Total assistance;+2 for safety/equipment;Bed level               Vision Baseline Vision/History: 1 Wears glasses Patient Visual Report: Other (comment) (Pt unable to report)       Perception         Praxis         Pertinent Vitals/Pain Pain Assessment Pain Assessment: Faces Faces Pain Scale: No hurt Pain Intervention(s): Monitored during session     Extremity/Trunk Assessment Upper Extremity Assessment Upper Extremity Assessment: Right hand dominant;Generalized weakness;RUE deficits/detail;LUE deficits/detail RUE Deficits / Details: generalized weakness; decreased proprioception; decreased fine and gross motor coordination RUE Sensation: decreased proprioception RUE Coordination: decreased fine motor;decreased gross motor LUE Deficits / Details: generalized weakness; decreased proprioception; decreased fine and gross motor coordination LUE Sensation: decreased proprioception LUE Coordination: decreased fine motor;decreased gross motor   Lower Extremity Assessment Lower Extremity Assessment: Defer to PT evaluation   Cervical / Trunk Assessment Cervical / Trunk  Assessment: Kyphotic   Communication Communication Communication: Impaired Factors Affecting Communication: Difficulty expressing self;Reduced clarity of speech   Cognition Arousal: Lethargic (Pt laregely lethargic with multiple very brief (largely <30 seconds with one period of alertness of approx. 1.5 minutes) periods of alertness with stimulation during session) Behavior During Therapy: Flat affect Cognition: Cognition impaired, History of cognitive impairments   Orientation impairments: Place, Time, Situation (Pt able to state his first name and answers to his first name. Unable to answer any other orientation questions, including his birthday, even with cues.) Awareness: Intellectual awareness impaired, Online awareness impaired Memory impairment (select all impairments): Short-term memory, Working memory, Non-declarative long-term memory, Geneticist, molecular long-term memory Attention impairment (select first level of impairment): Selective attention Executive functioning impairment (select all impairments): Initiation, Organization, Sequencing, Reasoning, Problem solving OT - Cognition Comments: Pt's wife reports pt has had a cognitive decline beginning approx 2 years ago with more noticible and fast cogntive decline over the past 3 months. Wife further reports pt current cognitve level is further declined than baseline just prior to this admission.                 Following commands: Impaired Following commands impaired: Follows one step commands inconsistently, Follows one step commands with increased time (Follows 1-step commands < 25% of the time with cues)     Cueing  General Comments   Cueing Techniques: Verbal cues;Tactile cues;Visual cues  Pt's wife present throughout session. Pt VSS on RA throughout session.   Exercises     Shoulder Instructions      Home Living Family/patient expects to be discharged to:: Private residence Living Arrangements: Spouse/significant  other Available Help at Discharge: Family;Available 24 hours/day Type of Home: House Home Access: Stairs to enter [employer] of Steps:  3 Entrance Stairs-Rails: Right Home Layout: Multi-level;Able to live on main level with bedroom/bathroom Alternate Level Stairs-Number of Steps: flight   Bathroom Shower/Tub: Arts development officer Toilet: Handicapped height     Home Equipment: Agricultural consultant (2 wheels);Shower seat;Hand held Financial trader (4 wheels)          Prior Functioning/Environment Prior Level of Function : Independent/Modified Independent;Driving;Needs assist             Mobility Comments: Per pt's wife, pt is Ind to Mod I with functional mobility with intermittent use of a RW. However, pt's wife also reprots pt has been less steady over the past few months. ADLs Comments: Per pt's wife, pt Ind with ADLs at baseline. Wife reports pt has been very sedentary and describes pt as being depressed and rarely leaving the bed since his cardiac procedure in 12/2022 that was followed by an ankle fx soon after. Wife also reports pt has been needing more and more assistance with IADLs, including medicaiton management and financial management with wife describing a slow decline beginning approx. 2 years ago with a more rapid decline since 12/2022 and even more rapid over the past 3 months. Wife also reports pt rarely drives, but when he does, he has been getting lost in familier places and becoming more anxious than usual while driving. 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 At baseline, pt also plays the guitar and enjoys spending time with his dog.    OT Problem List: Decreased strength;Decreased activity tolerance;Decreased coordination;Decreased cognition;Decreased safety awareness;Decreased knowledge of use of DME or AE;Decreased knowledge of precautions   OT Treatment/Interventions: Self-care/ADL training;Therapeutic exercise;DME and/or AE  instruction;Therapeutic activities;Cognitive remediation/compensation;Patient/family education;Balance training      OT Goals(Current goals can be found in the care plan section)   Acute Rehab OT Goals OT Goal Formulation: With family (wife) Time For Goal Achievement: 09/20/23 Potential to Achieve Goals: Fair ADL Goals Pt Will Perform Eating: with supervision;with set-up;sitting Pt Will Perform Grooming: with min assist;sitting Pt Will Perform Upper Body Bathing: with min assist;sitting Pt Will Perform Upper Body Dressing: with min assist;sitting Pt Will Perform Lower Body Dressing: with mod assist;sitting/lateral leans;sit to/from stand Pt Will Transfer to Toilet: with min assist;ambulating;bedside commode (with least restrictive AD) Additional ADL Goal #1: Patient will demonstrate ability to accurately follow 1-step commands in 8/10 opportunities with Mod I.   OT Frequency:  Min 2X/week    Co-evaluation              AM-PAC OT 6 Clicks Daily Activity     Outcome Measure Help from another person eating meals?: Total Help from another person taking care of personal grooming?: A Lot Help from another person toileting, which includes using toliet, bedpan, or urinal?: Total Help from another person bathing (including washing, rinsing, drying)?: A Lot Help from another person to put on and taking off regular upper body clothing?: A Lot Help from another person to put on and taking off regular lower body clothing?: Total 6 Click Score: 9   End of Session Nurse Communication: Mobility status;Other (comment) (Pt participation limited by fatigue, lethargy, and current cognitive level.)  Activity Tolerance: Patient limited by fatigue;Patient limited by lethargy;Other (comment) (Pt limited by current cognitive level) Patient left: in bed;with call bell/phone within reach;with bed alarm set;with family/visitor present  OT Visit Diagnosis: Other abnormalities of gait and mobility  (R26.89);Muscle weakness (generalized) (M62.81);Ataxia, unspecified (R27.0);History of falling (Z91.81);Other symptoms and signs involving cognitive function  Time: 8449-8373 OT Time Calculation (min): 36 min Charges:  OT General Charges $OT Visit: 1 Visit OT Evaluation $OT Eval Moderate Complexity: 1 Mod OT Treatments $Self Care/Home Management : 8-22 mins  Margarie Rockey HERO., OTR/L, MA Acute Rehab [phone]   Margarie FORBES Horns 09/06/2023, 9:37 PM

## 2023-09-06 NOTE — Progress Notes (Signed)
 SLP Cancellation Note  Patient Details Name: James Ibarra MRN: [id number] DOB: 1950-06-21   Cancelled treatment:       Reason Eval/Treat Not Completed: Patient at procedure or test/unavailable. Pt working with OT at time of attempt. SLP will f/u as schedule permits.    Damien Blumenthal, M.A., CCC-SLP Speech Language Pathology, Acute Rehabilitation Services  Secure Chat preferred [phone]  09/06/2023, 4:29 PM

## 2023-09-06 NOTE — Progress Notes (Addendum)
 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  No clear focal deficits to suggest stroke but MRI does show punctate diffusion positive lesion in the left hippocampus. He is also on CIWA protocol and has been receiving Ativan   OBJECTIVE  CBC    Component Value Date/Time   WBC 12.8 (H) 09/06/2023 0554   RBC 4.04 (L) 09/06/2023 0554   HGB 12.2 (L) 09/06/2023 0554   HGB 13.7 05/21/2023 1139   HCT 36.0 (L) 09/06/2023 0554   HCT 41.5 05/21/2023 1139   PLT 146 (L) 09/06/2023 0554   PLT 213 05/21/2023 1139   MCV 89.1 09/06/2023 0554   MCV 87 05/21/2023 1139   MCH 30.2 09/06/2023 0554   MCHC 33.9 09/06/2023 0554   RDW 14.0 09/06/2023 0554   RDW 15.7 (H) 05/21/2023 1139   LYMPHSABS 0.8 08/16/2022 0412   MONOABS 1.5 (H) 08/16/2022 0412   EOSABS 0.1 08/16/2022 0412   BASOSABS 0.0 08/16/2022 0412    BMET    Component Value Date/Time   NA 138 09/06/2023 0554   NA 140 05/21/2023 1139   K 3.0 (L) 09/06/2023 0554   CL 101 09/06/2023 0554   CO2 25 09/06/2023 0554   GLUCOSE 108 (H) 09/06/2023 0554   BUN 13 09/06/2023 0554   BUN 10 05/21/2023 1139   CREATININE 0.74 09/06/2023 0554   CALCIUM  8.5 (L) 09/06/2023 0554   EGFR 94 05/21/2023 1139   GFRNONAA >60 09/06/2023 0554    IMAGING past 24 hours DG Pelvis Portable Result Date: 09/05/2023 CLINICAL DATA:  Status post left hip replacement. EXAM: PORTABLE PELVIS 1-2 VIEWS COMPARISON:  Preoperative imaging. FINDINGS: Left hip arthroplasty in expected alignment. No periprosthetic lucency. There is a small fracture arising from the greater trochanter that was not definitively seen on preoperative imaging. Recent postsurgical change includes air and edema in the soft tissues. IMPRESSION: 1. Left hip arthroplasty in expected alignment. 2. Small fracture arising from the greater  trochanter, not definitively seen on preoperative imaging. This does not abut the hardware. Electronically Signed   By: Andrea Gasman M.D.   On: 09/05/2023 22:09   VAS US  CAROTID Result Date: 09/05/2023 Carotid Arterial Duplex Study Patient Name:  James Ibarra  Date of Exam:   09/05/2023 Medical Rec #: [id number]        Accession #:    [id number] Date of Birth: 10-24-1950        Patient Gender: M Patient Age:   36 years Exam Location:  [facility] Procedure:      VAS US  CAROTID Referring Phys: MARIO PATEL --------------------------------------------------------------------------------  Indications:       Syncope and Weakness. Risk Factors:      Hypertension, hyperlipidemia, coronary artery disease. Other Factors:     12/08/22 - MV repair. Comparison Study:  12/06/22 - WNL Performing Technologist: Ricka Sturdivant-Jones RDMS, RVT  Examination Guidelines: A complete evaluation includes B-mode imaging, spectral Doppler, color Doppler, and power Doppler as needed of all accessible portions of each vessel. Bilateral testing is considered an integral part of a complete examination. Limited examinations for reoccurring indications may be performed as noted.  Right Carotid Findings: +----------+--------+--------+--------+------------------+------------------+           PSV cm/sEDV cm/sStenosisPlaque DescriptionComments           +----------+--------+--------+--------+------------------+------------------+ CCA Prox  106     13                                                   +----------+--------+--------+--------+------------------+------------------+  CCA Distal78      14                                intimal thickening +----------+--------+--------+--------+------------------+------------------+ ICA Prox  46      9                                 intimal thickening +----------+--------+--------+--------+------------------+------------------+ ICA Distal70      16                                                    +----------+--------+--------+--------+------------------+------------------+ ECA       99      14                                                   +----------+--------+--------+--------+------------------+------------------+ +----------+--------+-------+----------------+-------------------+           PSV cm/sEDV cmsDescribe        Arm Pressure (mmHG) +----------+--------+-------+----------------+-------------------+ [id number]             Multiphasic, WNL                    +----------+--------+-------+----------------+-------------------+ +---------+--------+--+--------+--+---------+ VertebralPSV cm/s50EDV cm/s12Antegrade +---------+--------+--+--------+--+---------+  Left Carotid Findings: +----------+--------+--------+--------+------------------+------------------+           PSV cm/sEDV cm/sStenosisPlaque DescriptionComments           +----------+--------+--------+--------+------------------+------------------+ CCA Prox  100     16                                                   +----------+--------+--------+--------+------------------+------------------+ CCA Distal64      14                                intimal thickening +----------+--------+--------+--------+------------------+------------------+ ICA Prox  46      13                                intimal thickening +----------+--------+--------+--------+------------------+------------------+ ICA Mid   72      23                                                   +----------+--------+--------+--------+------------------+------------------+ ICA Distal113     26                                tortuous           +----------+--------+--------+--------+------------------+------------------+ ECA       59      11                                                   +----------+--------+--------+--------+------------------+------------------+  +----------+--------+--------+----------------+-------------------+  PSV cm/sEDV cm/sDescribe        Arm Pressure (mmHG) +----------+--------+--------+----------------+-------------------+ Subclavian115             Multiphasic, WNL                    +----------+--------+--------+----------------+-------------------+ +---------+--------+--+--------+--+---------+ VertebralPSV cm/s41EDV cm/s11Antegrade +---------+--------+--+--------+--+---------+   Summary: Right Carotid: The extracranial vessels were near-normal with only minimal wall                thickening or plaque. Left Carotid: The extracranial vessels were near-normal with only minimal wall               thickening or plaque. Vertebrals:  Bilateral vertebral arteries demonstrate antegrade flow. Subclavians: Normal flow hemodynamics were seen in bilateral subclavian              arteries. *See table(s) above for measurements and observations.  Electronically signed by Gaile New MD on 09/05/2023 at 9:55:49 PM.    Final    DG HIP UNILAT WITH PELVIS 1V LEFT Result Date: 09/05/2023 CLINICAL DATA:  Elective surgery EXAM: DG HIP (WITH OR WITHOUT PELVIS) 1V*L* COMPARISON:  Preoperative imaging FINDINGS: Seven fluoroscopic spot views of the pelvis and left hip obtained in the operating room. Sequential images during hip arthroplasty. Fluoroscopy time 25 seconds. Dose 2.6 mGy. IMPRESSION: Intraoperative fluoroscopy during left hip arthroplasty. Electronically Signed   By: Andrea Gasman M.D.   On: 09/05/2023 15:39   DG C-Arm 1-60 Min-No Report Result Date: 09/05/2023 Fluoroscopy was utilized by the requesting physician.  No radiographic interpretation.   DG C-Arm 1-60 Min-No Report Result Date: 09/05/2023 Fluoroscopy was utilized by the requesting physician.  No radiographic interpretation.    Vitals:   09/05/23 2358 09/06/23 0121 09/06/23 0405 09/06/23 0721  BP: (!) 186/78 (!) 169/83 (!) 166/81 138/80  Pulse: 75 83 94 90   Resp: 17  17   Temp: (!) 97.5 F (36.4 C)  98.4 F (36.9 C) 97.9 F (36.6 C)  TempSrc: Axillary  Rectal   SpO2: 95%  95% 94%  Weight:      Height:         PHYSICAL EXAM General:  Alert, thin malnourished looking Caucasian male Psych:  Mood and affect appropriate for situation CV: Regular rate and rhythm on monitor Respiratory:  Regular, unlabored respirations on room air GI: Abdomen soft and nontender   NEURO:  Mental Status: Alert, eyes open, does not answer any questions. Speech/Language: Garbled speech  Cranial Nerves:  II: PERRL.  Blinks to threat bilaterally III, IV, VI: Appears to have some restricted vertical eye movements.  Eyelids elevate symmetrically.  V: Sensation is intact to light touch and symmetrical to face.  VII: Face is symmetrical resting and smiling VIII: hearing intact to voice. IX, X: Palate elevates symmetrically.  KP:Dynloizm shrug 5/5. XII: tongue is midline without fasciculations. Motor: Moves all extremities purposefully.  Left leg has restricted due to pain Tone: is normal and bulk is normal Sensation-lyses to pain in all extremities Coordination: Does not complete Gait- deferred   ASSESSMENT/PLAN  James Ibarra is a 73 y.o. male with history of of MR s/p MAZE 12/2022, A fib on no AC (Eliquis  discontinued 08/30/2023, a fib ablation 05/24/2023, HTN, HLD, CAD, trigeminal neuralgia, ETOH abuse  who presented to the ED after having an unwitnessed fall. He is also found to have a left hip fracture.  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 ASPECTS 10.    CTA head & neck pending  MRI   Punctate focus of restricted diffusion in the left hippocampus, compatible with transient global amnesia versus acute infarct.  EEG -no seizures or epileptiform discharges 2D Echo pending  LDL 83 HgbA1c  5.1 VTE prophylaxis - lovenox  No antithrombotic prior to admission, now on aspirin  81 mg daily  Therapy recommendations:  Pending Disposition:  pending  Acute left femoral neck fracture s/p total hip replacement on 7/2 Up with therapy WBAT LLE Dvt ppx- lovenox  scds  Hx Atrial fibrillation s/p ablation Continue telemetry monitoring Consider long term heart monitor   Hypertension Home meds:  lopressor  50mg  Stable Blood Pressure Goal: BP less than 220/110   Hyperlipidemia Home meds:  crestor  20mg , resumed in hospital LDL 83, goal < 70 Continue statin at discharge  Dysphagia Patient has post-stroke dysphagia, SLP consulted    Diet   Diet regular Room service appropriate? Yes; Fluid consistency: Thin   Advance diet as tolerated  Other Stroke Risk Factors ETOH use, alcohol level <15, advised to drink no more than 2 drink(s) a day CIWA protocol  Obesity, Body mass index is 20.45 kg/m., BMI >/= 30 associated with increased stroke risk, recommend weight loss, diet and exercise as appropriate    Hospital day # 2  Patient seen and examined by NP/APP with MD. MD to update note as needed.   Jorene Last, DNP, FNP-BC [facility] Pager: [phone]  I have personally obtained history,examined this patient, reviewed notes, independently viewed imaging studies, participated in medical decision making and plan of care.ROS completed by me personally and pertinent positives fully documented  I have made any additions or clarifications directly to the above note. Agree with note above.  Patient presented with unwitnessed fall likely in the setting of heavy alcohol intake with hip fracture.  He has had mental status felt to be related to alcohol withdrawal MRI scan shows tiny punctate left hippocampal diffusion hyperintensity likely a silent infarct.  Patient is not a good long-term anticoagulation candidate due to heavy alcohol intake and will not recommend prolonged  cardiac monitoring to look for A-fib.  Recommend aspirin  alone for the silent infarct as well as continue ongoing stroke workup.  Continue alcohol withdrawal precautions.  EEG shows no seizure activity.  No family available at the bedside for discussion.  Discussed with Dr. Erle.    I personally spent a total of 50 minutes in the care of the patient today including getting/reviewing separately obtained history, performing a medically appropriate exam/evaluation, counseling and educating, placing orders, referring and communicating with other health care professionals, documenting clinical information in the EHR, independently interpreting results, and coordinating care.        Eather Popp, MD Medical Director [facility] Pager: [phone] 09/06/2023 5:04 PM   To contact Stroke Continuity provider, please refer to [email or web address]. After hours, contact General Neurology

## 2023-09-07 ENCOUNTER — Inpatient Hospital Stay (HOSPITAL_COMMUNITY)

## 2023-09-07 DIAGNOSIS — I6389 Other cerebral infarction: Secondary | ICD-10-CM

## 2023-09-07 DIAGNOSIS — S72012A Unspecified intracapsular fracture of left femur, initial encounter for closed fracture: Secondary | ICD-10-CM | POA: Diagnosis not present

## 2023-09-07 LAB — CBC
HCT: 34.4 % — ABNORMAL LOW (ref 39.0–52.0)
Hemoglobin: 11.6 g/dL — ABNORMAL LOW (ref 13.0–17.0)
MCH: 30.8 pg (ref 26.0–34.0)
MCHC: 33.7 g/dL (ref 30.0–36.0)
MCV: 91.2 fL (ref 80.0–100.0)
Platelets: 145 K/uL — ABNORMAL LOW (ref 150–400)
RBC: 3.77 MIL/uL — ABNORMAL LOW (ref 4.22–5.81)
RDW: 14.1 % (ref 11.5–15.5)
WBC: 9.6 K/uL (ref 4.0–10.5)
nRBC: 0 % (ref 0.0–0.2)

## 2023-09-07 LAB — BASIC METABOLIC PANEL WITH GFR
Anion gap: 12 (ref 5–15)
BUN: 12 mg/dL (ref 8–23)
CO2: 25 mmol/L (ref 22–32)
Calcium: 8.7 mg/dL — ABNORMAL LOW (ref 8.9–10.3)
Chloride: 103 mmol/L (ref 98–111)
Creatinine, Ser: 0.68 mg/dL (ref 0.61–1.24)
GFR, Estimated: >60 mL/min (ref >60)
Glucose, Bld: 88 mg/dL (ref 70–99)
Potassium: 3.4 mmol/L — ABNORMAL LOW (ref 3.5–5.1)
Sodium: 140 mmol/L (ref 135–145)

## 2023-09-07 LAB — HEMOGLOBIN A1C
Hgb A1c MFr Bld: 5.2 % (ref 4.8–5.6)
Mean Plasma Glucose: 103 mg/dL

## 2023-09-07 LAB — ECHOCARDIOGRAM COMPLETE
AR max vel: 3.02 cm2
AV Area VTI: 3.41 cm2
AV Area mean vel: 3.13 cm2
AV Mean grad: 8 mmHg
AV Peak grad: 14.9 mmHg
Ao pk vel: 1.93 m/s
Area-P 1/2: 3.37 cm2
Height: 73 in
S' Lateral: 3.4 cm
Weight: 2480 [oz_av]

## 2023-09-07 MED ORDER — POTASSIUM CHLORIDE CRYS ER 20 MEQ PO TBCR
40.0000 meq | EXTENDED_RELEASE_TABLET | Freq: Two times a day (BID) | ORAL | Status: AC
  Administered 2023-09-07 (×2): 40 meq via ORAL
  Filled 2023-09-07 (×2): qty 2

## 2023-09-07 NOTE — Progress Notes (Signed)
 Physical Therapy Treatment Patient Details Name: James Ibarra MRN: [id number] DOB: [birth year] Today's Date: 09/07/2023   History of Present Illness Pt is a 73 y.o. male admitted 7/1 after being found down at home. 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 Xray revealed L hip fx. He underwent L THA 7/2. PMH:  MR s/p repair (Oct 2024), HTN, HLD, CAD, a fib, ETOH abuse    PT Comments  Pt supine in bed on arrival.  He continues to improve and able to perform transfers and short bout of gt training.  He remains limited due to cognition but was pleasant throughout session.  He continues to benefit from rehab in a post acute setting to improve strength and function and decrease care giver burden.      If plan is discharge home, recommend the following: Two people to help with walking and/or transfers;Two people to help with bathing/dressing/bathroom   Can travel by private vehicle     No  Equipment Recommendations  Other (comment) (TBD)    Recommendations for Other Services       Precautions / Restrictions Precautions Precautions: Fall Restrictions Weight Bearing Restrictions Per Provider Order: Yes LLE Weight Bearing Per Provider Order: Weight bearing as tolerated Other Position/Activity Restrictions: no ROM restrictions     Mobility  Bed Mobility Overal bed mobility: Needs Assistance Bed Mobility: Supine to Sit     Supine to sit: Mod assist Sit to supine: Mod assist   General bed mobility comments: Pt required assistance to advance LEs to edge of bed and elevate trunk into sitting at edge of bed.  Pt continues to improve slowly and required less assistance to achieve sitting edge of bed with intermittent assistance to sit upright.  Pt required lifting of LEs to return back to bed.  Once in supine he was able to bridge to correct hip placement in bed.    Transfers Overall transfer  level: Needs assistance Equipment used: Rolling walker (2 wheels) Transfers: Sit to/from Stand Sit to Stand: Mod assist, From elevated surface           General transfer comment: Cues for hand placement and forward weight shifting.  Pt reaches to RW to pull on device to achieve standing despite cues to push.    Ambulation/Gait Ambulation/Gait assistance: Max assist Gait Distance (Feet): 6 Feet   Gait Pattern/deviations: Step-to pattern, Trunk flexed, Shuffle       General Gait Details: Series of forward, backward and side stepping to achieve steps away from bed followed by side stepping before sitting into bed.  He was unable to follow commands to progress gt beyond 6 ft and kept removing hands from RW to reach toward bed.   Stairs             Wheelchair Mobility     Tilt Bed    Modified Rankin (Stroke Patients Only)       Balance Overall balance assessment: Needs assistance Sitting-balance support: Bilateral upper extremity supported Sitting balance-Leahy Scale: Poor Sitting balance - Comments: mod assist to maintain sitting balance EOB Postural control: Right lateral lean   Standing balance-Leahy Scale: Poor Standing balance comment: heavy reliance on external support and B UE support.                            Communication Communication Communication: Impaired Factors Affecting Communication: Difficulty expressing self;Reduced clarity of  speech  Cognition Arousal: Alert Behavior During Therapy: Flat affect   PT - Cognitive impairments: Difficult to assess                       PT - Cognition Comments: Pt continues to have long pauses where he does not follow commands, this makes it difficult to progress mobility. Following commands: Impaired Following commands impaired: Follows one step commands inconsistently, Follows one step commands with increased time    Cueing Cueing Techniques: Verbal cues, Tactile cues, Visual cues   Exercises      General Comments        Pertinent Vitals/Pain Pain Assessment Pain Assessment: Faces Faces Pain Scale: Hurts even more Pain Location: LLE with mobility Pain Descriptors / Indicators: Moaning, Guarding, Grimacing Pain Intervention(s): Monitored during session, Repositioned    Home Living                          Prior Function            PT Goals (current goals can now be found in the care plan section) Acute Rehab PT Goals Patient Stated Goal: unable to state Potential to Achieve Goals: Good Progress towards PT goals: Progressing toward goals    Frequency    Min 2X/week      PT Plan      Co-evaluation              AM-PAC PT 6 Clicks Mobility   Outcome Measure  Help needed turning from your back to your side while in a flat bed without using bedrails?: A Lot Help needed moving from lying on your back to sitting on the side of a flat bed without using bedrails?: A Lot Help needed moving to and from a bed to a chair (including a wheelchair)?: A Lot Help needed standing up from a chair using your arms (e.g., wheelchair or bedside chair)?: A Lot Help needed to walk in hospital room?: Total Help needed climbing 3-5 steps with a railing? : Total 6 Click Score: 10    End of Session Equipment Utilized During Treatment: Gait belt Activity Tolerance: Patient limited by lethargy (cognition limits progress.) Patient left: in bed;with call bell/phone within reach (did not set bed alarm as NT had bed in high posiiton to replace condom cath.) Nurse Communication: Mobility status PT Visit Diagnosis: Other abnormalities of gait and mobility (R26.89);Pain Pain - Right/Left: Left Pain - part of body: Hip     Time: 8565-8545 PT Time Calculation (min) (ACUTE ONLY): 20 min  Charges:    $Therapeutic Activity: 8-22 mins PT General Charges $$ ACUTE PT VISIT: 1 Visit                     James Ibarra , PTA Acute Rehabilitation  Services Office [phone]    Gradie Butrick JINNY Gosling 09/07/2023, 3:12 PM

## 2023-09-07 NOTE — TOC Progression Note (Addendum)
 Transition of Care [facility]) - Progression Note    Patient Details  Name: James Ibarra MRN: [id number] Date of Birth: 1951/01/02  [facility] CM/SW Contact  Bridget Cordella Simmonds, LCSW Phone Number: 09/07/2023, 12:59 PM  Clinical Narrative:   Bed offers provided to pt wife Holli on medicare choice document.  She would like to accept offer at [facility].    [facility]: [facility] can take pt tomorrow if they have DC summary by 11am.  MD informed.   Auth request submitted in [employer].  [id number].  Expected Discharge Plan: Skilled Nursing Facility Barriers to Discharge: Continued Medical Work up, English as a second language teacher, SNF Pending bed offer  Expected Discharge Plan and Services In-house Referral: Clinical Social Work   Post Acute Care Choice: Skilled Nursing Facility Living arrangements for the past 2 months: Single Family Home                                       Social Determinants of Health (SDOH) Interventions SDOH Screenings   Food Insecurity: No Food Insecurity (09/06/2023)  Housing: Low Risk  (09/06/2023)  Transportation Needs: Patient Unable To Answer (09/06/2023)  Utilities: Not At Risk (09/06/2023)  Social Connections: Unknown (09/06/2023)  Tobacco Use: Low Risk  (09/05/2023)    Readmission Risk Interventions    12/14/2022   11:51 AM  Readmission Risk Prevention Plan  Transportation Screening Complete  PCP or Specialist Appt within 5-7 Days --  Home Care Screening Complete  Medication Review (RN CM) Complete

## 2023-09-07 NOTE — Progress Notes (Signed)
 [facility] PROGRESS NOTE    Progress Note  James Ibarra  FMW:[id number] DOB: 1950/05/21 DOA: 09/04/2023 PCP: Kip Righter, MD     Brief Narrative:   James Ibarra is an 73 y.o. male past medical history alcohol abuse significant for mitral regurgitation repair, essential hypertension, CAD A-fib status post maze with left atrial appendage clipping on October 2024 was found to to be in the ground.  Was found to have a left hip fracture, CT scan of the abdomen pelvis showed left comminuted femoral fracture   Assessment/Plan:   Subcapital fracture of neck of left femur, closed, initial encounter [facility]) Orthopedic surgery was consulted. MRI done there was a concern for CVA neurology was consulted, they deemed this not to be a stroke, no reason to delay surgery. She is status post total hip replacement on the left. Analgesics and coagulation per orthopedic surgery. Started on MiraLAX  p.o. twice daily  PT OT evaluated the patient will need skilled nursing facility placement. Awaiting skilled nursing facility placement.  Syncope: Neurology was consulted, recommended risk and benefits are reasonable for the patient to proceed with left hip repair. HgbA1c 5.2, fasting lipid panel HDL greater than 40 LDL less than 60 PT, OT, for post surgical intervention CT of the head and neck pending Transthoracic Echo, pending  Abnormal EKG with elevated troponins: He is asymptomatic cardiology was consulted recommended to keep potassium greater than 3. Patient very functional can complete more than 4 METS. They recommended no further recommendations   Acute confusional state: Try to minimize narcotics family is at bedside.  EEG showed no evidence of seizures. Use melatonin at night, Seroquel  for agitation. High risk for sundowning and aspiration. Had no events overnight.  New onset Fever: With a Tmax of 100.6, no leukocytosis blood pressure stable. Continue to monitor fever  curve. No cough with eating.  Essential hypertension: Continue metoprolol  and ARB.  Chronic diastolic dysfunction: Appears euvolemic.  CAD without angina: Continue metoprolol  and statins.  Traumatic rhabdomyolysis: Continue IV fluids CK is trending down.  Malnutrition of moderate degree noted  DVT prophylaxis: lovenox  Family Communication:wife Status is: Inpatient Remains inpatient appropriate because: Subcapital femoral fracture    Code Status:     Code Status Orders  (From admission, onward)           Start     Ordered   09/04/23 1855  Full code  Continuous       Question:  By:  Answer:  Other   09/04/23 1856           Code Status History     Date Active Date Inactive Code Status Order ID Comments User Context   05/24/2023 1011 05/24/2023 1827 Full Code [id number]  James Eulas BRAVO, MD Inpatient   12/08/2022 1116 12/14/2022 1657 Full Code [id number]  James Ibarra Inpatient   10/12/2022 1239 10/12/2022 2007 Full Code [id number]  James Lurena POUR, MD Inpatient   08/12/2022 1602 08/25/2022 2349 Full Code [id number]  James Marsa NOVAK, MD ED      Advance Directive Documentation    Flowsheet Row Most Recent Value  Type of Advance Directive Healthcare Power of Attorney, Living will  Pre-existing out of facility DNR order (yellow form or pink MOST form) --  MOST Form in Place? --      IV Access:   Peripheral IV   Procedures and diagnostic studies:   CT ANGIO HEAD NECK W WO CM Result Date: 09/06/2023 CLINICAL DATA:  Concern for stroke. EXAM:  CT ANGIOGRAPHY HEAD AND NECK WITH AND WITHOUT CONTRAST TECHNIQUE: Multidetector CT imaging of the head and neck was performed using the standard protocol during bolus administration of intravenous contrast. Multiplanar CT image reconstructions and MIPs were obtained to evaluate the vascular anatomy. Carotid stenosis measurements (when applicable) are obtained utilizing NASCET criteria, using the distal  internal carotid diameter as the denominator. RADIATION DOSE REDUCTION: This exam was performed according to the departmental dose-optimization program which includes automated exposure control, adjustment of the mA and/or kV according to patient size and/or use of iterative reconstruction technique. CONTRAST:  75mL OMNIPAQUE  IOHEXOL  350 MG/ML SOLN COMPARISON:  CT head and MRI head 09/04/2023. FINDINGS: CT HEAD FINDINGS Brain: No acute intracranial hemorrhage. No CT evidence of acute infarct. Nonspecific hypoattenuation in the periventricular and subcortical white matter favored to reflect chronic microvascular ischemic changes. No edema, mass effect, or midline shift. The basilar cisterns are patent. 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 Ventricles: Prominence of the ventricles suggesting underlying parenchymal volume loss. Vascular: No hyperdense vessel or unexpected calcification. Skull: No acute or aggressive finding. Orbits: Bilateral lens replacement.  Scleral buckle on the left. Sinuses: The visualized paranasal sinuses are clear. Other: Mastoid air cells are clear. CTA NECK FINDINGS Aortic arch: Standard configuration of the aortic arch. Imaged portion shows no evidence of aneurysm or dissection. No significant stenosis of the major arch vessel origins. Pulmonary arteries: As permitted by contrast timing, there are no filling defects in the visualized pulmonary arteries. Subclavian arteries: The subclavian arteries are patent bilaterally. Right carotid system: No evidence of dissection, stenosis (50% or greater), or occlusion. Minimal atherosclerosis at the carotid bifurcation. Tortuosity and ectasia of the distal cervical ICA. Left carotid system: No evidence of dissection, stenosis (50% or greater), or occlusion. Tortuosity and ectasia of the distal cervical ICA. Vertebral arteries: Codominant. No evidence of dissection, stenosis (50% or  greater), or occlusion. Skeleton: No acute findings. Degenerative changes in the cervical spine. Sequelae of median sternotomy. Other neck: The visualized airway is patent. No cervical lymphadenopathy. Upper chest: Visualized lung apices are clear. Review of the MIP images confirms the above findings CTA HEAD FINDINGS ANTERIOR CIRCULATION: The intracranial ICAs are patent bilaterally. No significant stenosis, proximal occlusion, aneurysm, or vascular malformation. MCAs: The middle cerebral arteries are patent bilaterally. ACAs: The anterior cerebral arteries are patent bilaterally. POSTERIOR CIRCULATION: No significant stenosis, proximal occlusion, aneurysm, or vascular malformation. PCAs: The posterior cerebral arteries are patent bilaterally. Pcomm: The posterior communicating arteries are visualized bilaterally. SCAs: The superior cerebellar arteries are patent bilaterally. Basilar artery: Patent AICAs: Patent PICAs: Patent Vertebral arteries: The intracranial vertebral arteries are patent. Venous sinuses: As permitted by contrast timing, patent. Anatomic variants: None Review of the MIP images confirms the above findings IMPRESSION: No large vessel occlusion. No high-grade stenosis, aneurysm, or dissection of the arteries in the head and neck. Ectasia and tortuosity of the distal cervical ICAs which may be related to hypertension. No CT evidence of acute intracranial abnormality. Electronically Signed   By: Donnice Mania M.D.   On: 09/06/2023 18:27   EEG adult Result Date: 09/06/2023 James Arlin KIDD, MD     09/06/2023 12:26 PM Patient Name: James Ibarra MRN: [id number] Epilepsy Attending: Arlin Ibarra James Referring Physician/Provider: Rosemarie Eather RAMAN, MD Date: 09/06/2023 Duration: 25.38 mins Patient history: 73yo M with syncope. EEG to evaluate for seizure. Level of alertness: Awake, asleep AEDs during EEG study: None Technical aspects: This EEG study was done  with scalp electrodes positioned according to the  10-20 International system of electrode placement. Electrical activity was reviewed with band pass filter of 1-70Hz , sensitivity of 7 uV/mm, display speed of 30mm/sec with a 60Hz  notched filter applied as appropriate. EEG data were recorded continuously and digitally stored.  Video monitoring was available and reviewed as appropriate. Description: The posterior dominant rhythm consists of 8 Hz activity of moderate voltage (25-35 uV) seen predominantly in posterior head regions, symmetric and reactive to eye opening and eye closing. Sleep was characterized by vertex waves, sleep spindles (12 to 14 Hz), maximal frontocentral region. Hyperventilation and photic stimulation were not performed.   IMPRESSION: This study is within normal limits. No seizures or epileptiform discharges were seen throughout the recording. A normal interictal EEG does not exclude the diagnosis of epilepsy. Arlin MALVA Krebs   DG Pelvis Portable Result Date: 09/05/2023 CLINICAL DATA:  Status post left hip replacement. EXAM: PORTABLE PELVIS 1-2 VIEWS COMPARISON:  Preoperative imaging. FINDINGS: Left hip arthroplasty in expected alignment. No periprosthetic lucency. There is a small fracture arising from the greater trochanter that was not definitively seen on preoperative imaging. Recent postsurgical change includes air and edema in the soft tissues. IMPRESSION: 1. Left hip arthroplasty in expected alignment. 2. Small fracture arising from the greater trochanter, not definitively seen on preoperative imaging. This does not abut the hardware. Electronically Signed   By: Andrea Gasman M.D.   On: 09/05/2023 22:09   VAS US  CAROTID Result Date: 09/05/2023 Carotid Arterial Duplex Study Patient Name:  James Ibarra  Date of Exam:   09/05/2023 Medical Rec #: [id number]        Accession #:    [id number] Date of Birth: 05-24-1950        Patient Gender: M Patient Age:   4 years Exam Location:  [facility] Procedure:      VAS US  CAROTID  Referring Phys: EKTA PATEL --------------------------------------------------------------------------------  Indications:       Syncope and Weakness. Risk Factors:      Hypertension, hyperlipidemia, coronary artery disease. Other Factors:     12/08/22 - MV repair. Comparison Study:  12/06/22 - WNL Performing Technologist: James Sturdivant-Jones RDMS, RVT  Examination Guidelines: A complete evaluation includes B-mode imaging, spectral Doppler, color Doppler, and power Doppler as needed of all accessible portions of each vessel. Bilateral testing is considered an integral part of a complete examination. Limited examinations for reoccurring indications may be performed as noted.  Right Carotid Findings: +----------+--------+--------+--------+------------------+------------------+           PSV cm/sEDV cm/sStenosisPlaque DescriptionComments           +----------+--------+--------+--------+------------------+------------------+ CCA Prox  106     13                                                   +----------+--------+--------+--------+------------------+------------------+ CCA Distal78      14                                intimal thickening +----------+--------+--------+--------+------------------+------------------+ ICA Prox  46      9                                 intimal thickening +----------+--------+--------+--------+------------------+------------------+ ICA  Distal70      16                                                   +----------+--------+--------+--------+------------------+------------------+ ECA       99      14                                                   +----------+--------+--------+--------+------------------+------------------+ +----------+--------+-------+----------------+-------------------+           PSV cm/sEDV cmsDescribe        Arm Pressure (mmHG) +----------+--------+-------+----------------+-------------------+ [id number]              Multiphasic, WNL                    +----------+--------+-------+----------------+-------------------+ +---------+--------+--+--------+--+---------+ VertebralPSV cm/s50EDV cm/s12Antegrade +---------+--------+--+--------+--+---------+  Left Carotid Findings: +----------+--------+--------+--------+------------------+------------------+           PSV cm/sEDV cm/sStenosisPlaque DescriptionComments           +----------+--------+--------+--------+------------------+------------------+ CCA Prox  100     16                                                   +----------+--------+--------+--------+------------------+------------------+ CCA Distal64      14                                intimal thickening +----------+--------+--------+--------+------------------+------------------+ ICA Prox  46      13                                intimal thickening +----------+--------+--------+--------+------------------+------------------+ ICA Mid   72      23                                                   +----------+--------+--------+--------+------------------+------------------+ ICA Distal113     26                                tortuous           +----------+--------+--------+--------+------------------+------------------+ ECA       59      11                                                   +----------+--------+--------+--------+------------------+------------------+ +----------+--------+--------+----------------+-------------------+           PSV cm/sEDV cm/sDescribe        Arm Pressure (mmHG) +----------+--------+--------+----------------+-------------------+ [id number]             Multiphasic, WNL                    +----------+--------+--------+----------------+-------------------+ +---------+--------+--+--------+--+---------+  VertebralPSV cm/s41EDV cm/s11Antegrade +---------+--------+--+--------+--+---------+   Summary: Right Carotid: The  extracranial vessels were near-normal with only minimal wall                thickening or plaque. Left Carotid: The extracranial vessels were near-normal with only minimal wall               thickening or plaque. Vertebrals:  Bilateral vertebral arteries demonstrate antegrade flow. Subclavians: Normal flow hemodynamics were seen in bilateral subclavian              arteries. *See table(s) above for measurements and observations.  Electronically signed by Gaile New MD on 09/05/2023 at 9:55:49 PM.    Final    DG HIP UNILAT WITH PELVIS 1V LEFT Result Date: 09/05/2023 CLINICAL DATA:  Elective surgery EXAM: DG HIP (WITH OR WITHOUT PELVIS) 1V*L* COMPARISON:  Preoperative imaging FINDINGS: Seven fluoroscopic spot views of the pelvis and left hip obtained in the operating room. Sequential images during hip arthroplasty. Fluoroscopy time 25 seconds. Dose 2.6 mGy. IMPRESSION: Intraoperative fluoroscopy during left hip arthroplasty. Electronically Signed   By: Andrea Gasman M.D.   On: 09/05/2023 15:39   DG C-Arm 1-60 Min-No Report Result Date: 09/05/2023 Fluoroscopy was utilized by the requesting physician.  No radiographic interpretation.   DG C-Arm 1-60 Min-No Report Result Date: 09/05/2023 Fluoroscopy was utilized by the requesting physician.  No radiographic interpretation.     Medical Consultants:   None.   Subjective:    James Ibarra no complaints this morning has not had a bowel movement.  Objective:    Vitals:   09/06/23 2323 09/07/23 0547 09/07/23 0654 09/07/23 0737  BP: (!) 154/80 (!) 174/93 (!) 168/102 (!) 162/88  Pulse: 72 77 72 74  Resp: 20 15  15   Temp: 97.9 F (36.6 C) 98.5 F (36.9 C)  100 F (37.8 C)  TempSrc: Oral Oral    SpO2: 93% 100%  97%  Weight:      Height:       SpO2: 97 %   Intake/Output Summary (Last 24 hours) at 09/07/2023 0845 Last data filed at 09/07/2023 0755 Gross per 24 hour  Intake 710 ml  Output 550 ml  Net 160 ml   Filed Weights   09/04/23  1516 09/05/23 1248  Weight: 73 kg 70.3 kg    Exam: General exam: In no acute distress. Respiratory system: Good air movement and clear to auscultation. Cardiovascular system: S1 & S2 heard, RRR. No JVD. Gastrointestinal system: Abdomen is nondistended, soft and nontender.  Extremities: No pedal edema. Skin: No rashes, lesions or ulcers Psychiatry: Judgement and insight appear normal. Mood & affect appropriate.  Data Reviewed:    Labs: Basic Metabolic Panel: Recent Labs  Lab 09/04/23 1550 09/04/23 1604 09/05/23 0535 09/05/23 1739 09/06/23 0554 09/07/23 0548  NA 138 141 137  --  138 140  K 3.2* 3.2* 3.0*  --  3.0* 3.4*  CL 101 103 98  --  101 103  CO2 24  --  29  --  25 25  GLUCOSE 127* 125* 117*  --  108* 88  BUN 16 18 13   --  13 12  CREATININE 0.84 0.70 0.73 0.76 0.74 0.68  CALCIUM  8.9  --  8.5*  --  8.5* 8.7*  MG 2.0  --   --   --   --   --    GFR Estimated Creatinine Clearance: 83 mL/min (by C-G formula based on SCr of 0.68 mg/dL). Liver  Function Tests: Recent Labs  Lab 09/04/23 1550  AST 46*  ALT 26  ALKPHOS 62  BILITOT 2.3*  PROT 6.6  ALBUMIN  3.8   No results for input(s): LIPASE, AMYLASE in the last 168 hours. No results for input(s): AMMONIA in the last 168 hours. Coagulation profile Recent Labs  Lab 09/04/23 1550  INR 1.0   COVID-19 Labs  No results for input(s): DDIMER, FERRITIN, LDH, CRP in the last 72 hours.  Lab Results  Component Value Date   SARSCOV2NAA NEGATIVE 12/06/2022   SARSCOV2NAA NEGATIVE 08/12/2022    CBC: Recent Labs  Lab 09/04/23 1550 09/04/23 1604 09/05/23 0535 09/05/23 1739 09/06/23 0554 09/07/23 0548  WBC 15.4*  --  10.5 12.1* 12.8* 9.6  HGB 14.2 14.6 12.6* 12.3* 12.2* 11.6*  HCT 41.3 43.0 36.4* 35.9* 36.0* 34.4*  MCV 88.1  --  88.6 87.8 89.1 91.2  PLT 194  --  156 138* 146* 145*   Cardiac Enzymes: Recent Labs  Lab 09/04/23 1550 09/06/23 1156  CKTOTAL 780* 639*   BNP (last 3 results) No  results for input(s): PROBNP in the last 8760 hours. CBG: No results for input(s): GLUCAP in the last 168 hours. D-Dimer: No results for input(s): DDIMER in the last 72 hours. Hgb A1c: Recent Labs    09/06/23 0817  HGBA1C 5.2   Lipid Profile: Recent Labs    09/06/23 0817  CHOL 136  HDL 59  LDLCALC 57  TRIG 101  CHOLHDL 2.3   Thyroid  function studies: No results for input(s): TSH, T4TOTAL, T3FREE, THYROIDAB in the last 72 hours.  Invalid input(s): FREET3 Anemia work up: No results for input(s): VITAMINB12, FOLATE, FERRITIN, TIBC, IRON , RETICCTPCT in the last 72 hours. Sepsis Labs: Recent Labs  Lab 09/04/23 1604 09/05/23 0535 09/05/23 1739 09/06/23 0554 09/07/23 0548  WBC  --  10.5 12.1* 12.8* 9.6  LATICACIDVEN 3.0*  --   --   --   --    Microbiology Recent Results (from the past 240 hours)  Surgical PCR screen     Status: None   Collection Time: 09/05/23 12:13 AM   Specimen: Nasal Mucosa; Nasal Swab  Result Value Ref Range Status   MRSA, PCR NEGATIVE NEGATIVE Final   Staphylococcus aureus NEGATIVE NEGATIVE Final    Comment: (NOTE) The Xpert SA Assay (FDA approved for NASAL specimens in patients 86 years of age and older), is one component of a comprehensive surveillance program. It is not intended to diagnose infection nor to guide or monitor treatment. Performed at [facility], [address] [place], [place] [address]      Medications:    aspirin  EC  81 mg Oral Daily   docusate sodium   100 mg Oral BID   enoxaparin  (LOVENOX ) injection  40 mg Subcutaneous Q24H   feeding supplement  237 mL Oral BID BM   irbesartan   150 mg Oral Daily   melatonin  3 mg Oral QHS   metoprolol  tartrate  50 mg Oral BID   multivitamin  1 tablet Oral BID   multivitamin with minerals  1 tablet Oral Daily   mupirocin  ointment  1 Application Nasal BID   polyethylene glycol  17 g Oral BID   rosuvastatin   20 mg Oral Daily   [START ON  09/14/2023] thiamine  (VITAMIN B1) injection  100 mg Intravenous Daily   tranexamic acid  (CYKLOKAPRON ) 2,000 mg in sodium chloride  0.9 % 50 mL Topical Application  2,000 mg Topical Once   Continuous Infusions:  thiamine  (  VITAMIN B1) injection 500 mg (09/07/23 0534)   Followed by   NOREEN ON 09/08/2023] thiamine  (VITAMIN B1) injection        LOS: 3 days   Erle Odell Castor  [facility]  09/07/2023, 8:45 AM

## 2023-09-07 NOTE — Care Management Important Message (Signed)
 Important Message  Patient Details  Name: James Ibarra MRN: [id number] Date of Birth: 09/25/1950   Important Message Given:  Yes - Medicare IM     Jon Cruel 09/07/2023, 12:02 PM

## 2023-09-07 NOTE — Plan of Care (Addendum)
 PT is alert to self. Dilaudid  given x 2. Ativan  x 1 Continues on ciwa. Frequently removing tele. Turns self in bed. Surgical dsg intact to left hip . Urgency incontinence . Tylenol  given for pain and low grade temp. Temp improved after tylenol . BP elevated this am. Recheck. Pain meds given and hydralazine  prn. Bed bath given and condom cath placed. Pt incontinent urinating in bed and on floor.  Problem: Education: Goal: Verbalization of understanding the information provided (i.e., activity precautions, restrictions, etc) will improve Outcome: Not Progressing   Problem: Activity: Goal: Ability to ambulate and perform ADLs will improve Outcome: Not Progressing   Problem: Self-Concept: Goal: Ability to maintain and perform role responsibilities to the fullest extent possible will improve Outcome: Not Progressing   Problem: Education: Goal: Knowledge of General Education information will improve Description: Including pain rating scale, medication(s)/side effects and non-pharmacologic comfort measures Outcome: Progressing   Problem: Health Behavior/Discharge Planning: Goal: Ability to manage health-related needs will improve Outcome: Progressing   Problem: Clinical Measurements: Goal: Ability to maintain clinical measurements within normal limits will improve Outcome: Progressing Goal: Will remain free from infection Outcome: Progressing Goal: Diagnostic test results will improve Outcome: Progressing Goal: Respiratory complications will improve Outcome: Progressing Goal: Cardiovascular complication will be avoided Outcome: Progressing   Problem: Activity: Goal: Risk for activity intolerance will decrease Outcome: Progressing   Problem: Nutrition: Goal: Adequate nutrition will be maintained Outcome: Progressing   Problem: Coping: Goal: Level of anxiety will decrease Outcome: Progressing   Problem: Elimination: Goal: Will not experience complications related to bowel  motility Outcome: Progressing Goal: Will not experience complications related to urinary retention Outcome: Progressing   Problem: Pain Managment: Goal: General experience of comfort will improve and/or be controlled Outcome: Progressing   Problem: Safety: Goal: Ability to remain free from injury will improve Outcome: Progressing   Problem: Skin Integrity: Goal: Risk for impaired skin integrity will decrease Outcome: Progressing   Problem: Clinical Measurements: Goal: Postoperative complications will be avoided or minimized Outcome: Progressing   Problem: Pain Management: Goal: Pain level will decrease Outcome: Progressing

## 2023-09-07 NOTE — Progress Notes (Signed)
 Occupational Therapy Treatment Patient Details Name: James Ibarra MRN: [id number] DOB: August 12, 1950 Today's Date: 09/07/2023   History of present illness Pt is a 72 y.o. male admitted 7/1 after being found down at home. 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 Xray revealed L hip fx. He underwent L THA 7/2. 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 Pt appearing restless but agreeable to working with OT Pt's wife arrived at same time as OT. OT session focused on training pt in techniques for increased safety and independence with ADLs, bed mobility during/in preparation for functional tasks, and functional STS transfers and side stepping at EOB with a RW. Pt continues to present with deficits outlined below but with noted improvements this session in ability to attend to tasks, follow 1-steps commands (pt with approx. 60% success with increased time this session), respond to questions and express self in 1-2 word utterances, and actively participate in functional tasks. Pt currently demonstrating ability to complete UB ADLs with largely Min to Mod assist, LB ADLs with Mod to Total assist, bed mobility with Contact guard to Mod assist, and functional transfers with a RW with Mod assist. Acute OT to continue to follow. Post acute discharge, pt will benefit in intensive inpatient skilled rehab services < 3 hours per day to maximize rehab potential.       If plan is discharge home, recommend the following:  Two people to help with walking and/or transfers;Assistance with cooking/housework;Assistance with feeding;Direct supervision/assist for medications management;Direct  supervision/assist for financial management;Assist for transportation;Help with stairs or ramp for entrance;Supervision due to cognitive status;A lot of help with bathing/dressing/bathroom   Equipment Recommendations  Wheelchair (measurements OT);Wheelchair cushion (measurements OT);Other (comment) (TBD based on pt progress)    Recommendations for Other Services Speech consult;Other (comment) (Speech consult for cognition; Psychiatric consult)    Precautions / Restrictions Precautions Precautions: Fall Recall of Precautions/Restrictions: Impaired Restrictions Weight Bearing Restrictions Per Provider Order: Yes LLE Weight Bearing Per Provider Order: Weight bearing as tolerated Other Position/Activity Restrictions: no ROM restrictions       Mobility Bed Mobility Overal bed mobility: Needs Assistance Bed Mobility: Rolling, Sit to Sidelying Rolling: Contact guard assist       Sit to sidelying: Mod assist General bed mobility comments: Pt requiring assist to elevate B LE into bed    Transfers Overall transfer level: Needs assistance Equipment used: Rolling walker (2 wheels) Transfers: Sit to/from Stand Sit to Stand: Mod assist, From elevated surface           General transfer comment: Cues for hand placement and forward weight shifting.  Pt reaches to RW to pull on device to achieve standing despite cues to push. Pt able to take side steps x3 to move toward [facility] (pt's Right) with seated rest breaks between steps with Mod assist and cues.     Balance Overall balance assessment: Needs assistance Sitting-balance support: Single extremity supported, Bilateral upper extremity supported, Feet supported Sitting balance-Leahy Scale: Poor Sitting balance - Comments: Pt able to maintain static sit at EOB with UE support with close Supervision for safety. Pt with Right lateral lean in dynamic sitting and requiring Contact guard to Min assist  to maintain dynamic sitting balance. Postural  control: Right lateral lean   Standing balance-Leahy Scale: Poor Standing balance comment: heavy reliance on external support and B UE support.                           ADL either performed or assessed with clinical judgement   ADL Overall ADL's : Needs assistance/impaired Eating/Feeding: Minimal assistance;Bed level (cues for initiation; cues to maintain attention to task)   Grooming: Wash/dry hands;Wash/dry face;Minimal assistance;Bed level           Upper Body Dressing : Minimal assistance;Moderate assistance;Sitting;Cueing for sequencing (cues for initiation)   Lower Body Dressing: Total assistance;Sitting/lateral leans;Sit to/from stand;Cueing for safety;Cueing for sequencing (cues for initiation)       Toileting- Clothing Manipulation and Hygiene: Moderate assistance;Bed level Toileting - Clothing Manipulation Details (indicate cue type and reason): for use of handheld urinal from bed level       General ADL Comments: Pt with noted increase in active participation in tasks this session and with pt now expressing both prompted and unprompted preferences (chocolate vs vanilla) and needs (need for urinal) during funcitonal tasks.    Extremity/Trunk Assessment Upper Extremity Assessment Upper Extremity Assessment: Right hand dominant;Generalized weakness;RUE deficits/detail;LUE deficits/detail RUE Deficits / Details: generalized weakness; decreased proprioception; decreased fine and gross motor coordination RUE Sensation: decreased proprioception RUE Coordination: decreased fine motor;decreased gross motor LUE Deficits / Details: generalized weakness; decreased proprioception; decreased fine and gross motor coordination LUE Sensation: decreased proprioception LUE Coordination: decreased fine motor;decreased gross motor   Lower Extremity Assessment Lower Extremity Assessment: Defer to PT evaluation        Vision       Perception     Praxis      Communication Communication Communication: Impaired Factors Affecting Communication: Difficulty expressing self;Reduced clarity of speech;Other (comment) (Pt with continued significant communication deficits, but with noted improvements this session in pt responding to questions with head shake/nods or 1-2 word utterances to questions with pt able to express desire for specific foods, need for urinal, etc.)   Cognition Arousal: Alert Behavior During Therapy: Restless, Impulsive, WFL for tasks assessed/performed, Flat affect (Pt restless and impulsive at beginning of session, but calmed as session progressed with flat affect and behavior largely WFL at middle and end of session.) Cognition: Cognition impaired, History of cognitive impairments   Orientation impairments: Place, Time, Situation (Pt able to state his first name and answers to his first name. Unable to answer any other orientation questions, including his birthday, even with cues.) Awareness: Intellectual awareness impaired, Online awareness impaired Memory impairment (select all impairments): Short-term memory, Working memory, Non-declarative long-term memory, Geneticist, molecular long-term memory Attention impairment (select first level of impairment): Selective attention Executive functioning impairment (select all impairments): Initiation, Organization, Sequencing, Reasoning, Problem solving OT - Cognition Comments: Pt continues to present with significant cognitive deficts and below baseline cognition per wife's report. However, pt withnoted imporvements this session with pt demonstrating improved sustained attention, improved non-declaritive memory during grooming and self-feeding tasks, improved communication, and improved ability to follow 1-step commands.                 Following commands: Impaired Following commands impaired: Follows one step commands inconsistently, Follows one step commands with increased time (Pt accurately  following 1-step commands in 6/10 opportunities with significantly increased time and cues.)      Cueing   Cueing Techniques: Verbal cues, Tactile cues, Visual cues  Exercises      Shoulder Instructions       General Comments VSS on RA. Wife present throughout session. RN present during a portion of session.    Pertinent Vitals/ Pain       Pain Assessment Pain Assessment: Faces Faces Pain Scale: Hurts even more Pain Location: LLE Pain Descriptors / Indicators: Moaning, Guarding, Grimacing, Discomfort Pain Intervention(s): Limited activity within patient's tolerance, Monitored during session, Repositioned, Ice applied  Home Living                                          Prior Functioning/Environment              Frequency  Min 2X/week        Progress Toward Goals  OT Goals(current goals can now be found in the care plan section)  Progress towards OT goals: Progressing toward goals  Acute Rehab OT Goals Patient Stated Goal: pt unable to state  Plan      Co-evaluation                 AM-PAC OT 6 Clicks Daily Activity     Outcome Measure   Help from another person eating meals?: A Little Help from another person taking care of personal grooming?: A Little Help from another person toileting, which includes using toliet, bedpan, or urinal?: A Lot Help from another person bathing (including washing, rinsing, drying)?: A Lot Help from another person to put on and taking off regular upper body clothing?: A Little Help from another person to put on and taking off regular lower body clothing?: Total 6 Click Score: 14    End of Session Equipment Utilized During Treatment: Rolling walker (2 wheels);Gait belt  OT Visit Diagnosis: Other abnormalities of gait and mobility (R26.89);Muscle weakness (generalized) (M62.81);Ataxia, unspecified (R27.0);History of falling (Z91.81);Other symptoms and signs involving cognitive function   Activity  Tolerance Patient limited by fatigue;Other (comment) (Pt limited by current cognitive level)   Patient Left in bed;with call bell/phone within reach;with bed alarm set;with family/visitor present   Nurse Communication Mobility status;Other (comment) (Upon OT arrival, pt found sitting EOB nude with bed alarm sounding and with pt having pulled out IVs x2, removed condom catheter, and removed telemetry electrodes.)        Time: 8388-8296 OT Time Calculation (min): 52 min  Charges: OT General Charges $OT Visit: 1 Visit OT Treatments $Self Care/Home Management : 38-52 mins  Margarie Rockey HERO., OTR/L, MA Acute Rehab [phone]   Margarie FORBES Horns 09/07/2023, 9:38 PM

## 2023-09-07 NOTE — Progress Notes (Signed)
 SLP Cancellation Note  Patient Details Name: James Ibarra MRN: [id number] DOB: November 20, 1950   Cancelled treatment:       SLP attempted to see patient to cognitive-linguistic evaluation, though patient recently received dose of Dilaudid  and Ativan . SLP to return as patient is able to participate.    Isabellamarie Randa M.A., CCC-SLP 09/07/2023, 9:28 AM

## 2023-09-08 DIAGNOSIS — S72012A Unspecified intracapsular fracture of left femur, initial encounter for closed fracture: Secondary | ICD-10-CM | POA: Diagnosis not present

## 2023-09-08 LAB — CBC
HCT: 31.4 % — ABNORMAL LOW (ref 39.0–52.0)
Hemoglobin: 10.9 g/dL — ABNORMAL LOW (ref 13.0–17.0)
MCH: 31.1 pg (ref 26.0–34.0)
MCHC: 34.7 g/dL (ref 30.0–36.0)
MCV: 89.7 fL (ref 80.0–100.0)
Platelets: 174 10*3/uL (ref 150–400)
RBC: 3.5 MIL/uL — ABNORMAL LOW (ref 4.22–5.81)
RDW: 13.9 % (ref 11.5–15.5)
WBC: 9 10*3/uL (ref 4.0–10.5)
nRBC: 0 % (ref 0.0–0.2)

## 2023-09-08 LAB — BASIC METABOLIC PANEL WITH GFR
Anion gap: 11 (ref 5–15)
BUN: 7 mg/dL — ABNORMAL LOW (ref 8–23)
CO2: 26 mmol/L (ref 22–32)
Calcium: 8.8 mg/dL — ABNORMAL LOW (ref 8.9–10.3)
Chloride: 101 mmol/L (ref 98–111)
Creatinine, Ser: 0.63 mg/dL (ref 0.61–1.24)
GFR, Estimated: >60 mL/min
Glucose, Bld: 101 mg/dL — ABNORMAL HIGH (ref 70–99)
Potassium: 3.2 mmol/L — ABNORMAL LOW (ref 3.5–5.1)
Sodium: 138 mmol/L (ref 135–145)

## 2023-09-08 LAB — MAGNESIUM: Magnesium: 1.9 mg/dL (ref 1.7–2.4)

## 2023-09-08 MED ORDER — MAGNESIUM OXIDE -MG SUPPLEMENT 400 (240 MG) MG PO TABS
400.0000 mg | ORAL_TABLET | ORAL | Status: AC
  Administered 2023-09-08 (×2): 400 mg via ORAL
  Filled 2023-09-08 (×2): qty 1

## 2023-09-08 MED ORDER — POTASSIUM CHLORIDE CRYS ER 20 MEQ PO TBCR: 40.0000 meq | EXTENDED_RELEASE_TABLET | Freq: Two times a day (BID) | ORAL | Status: DC

## 2023-09-08 MED ORDER — POTASSIUM CHLORIDE CRYS ER 20 MEQ PO TBCR
40.0000 meq | EXTENDED_RELEASE_TABLET | ORAL | Status: AC
  Administered 2023-09-08 (×3): 40 meq via ORAL
  Filled 2023-09-08 (×3): qty 2

## 2023-09-08 MED ORDER — MAGNESIUM OXIDE -MG SUPPLEMENT 400 (240 MG) MG PO TABS: 400.0000 mg | ORAL_TABLET | ORAL | Status: DC

## 2023-09-08 NOTE — TOC Progression Note (Signed)
 Transition of Care [facility]) - Progression Note    Patient Details  Name: James Ibarra MRN: [id number] Date of Birth: [birth year]  [facility] CM/SW Contact  Dino CHRISTELLA Au, LCSWA Phone Number: 09/08/2023, 10:51 AM  Clinical Narrative:     Per Nav[employer] Portal, pt approved until 7/8  SW spoke with [facility] [phone]) can accept tomorrow if d/c summary done by 11am  Expected Discharge Plan: Skilled Nursing Facility Barriers to Discharge: Continued Medical Work up, English as a second language teacher, SNF Pending bed offer  Expected Discharge Plan and Services In-house Referral: Clinical Social Work   Post Acute Care Choice: Skilled Nursing Facility Living arrangements for the past 2 months: Single Family Home                                       Social Determinants of Health (SDOH) Interventions SDOH Screenings   Food Insecurity: No Food Insecurity (09/06/2023)  Housing: Low Risk  (09/06/2023)  Transportation Needs: Patient Unable To Answer (09/06/2023)  Utilities: Not At Risk (09/06/2023)  Social Connections: Unknown (09/06/2023)  Tobacco Use: Low Risk  (09/05/2023)    Readmission Risk Interventions    12/14/2022   11:51 AM  Readmission Risk Prevention Plan  Transportation Screening Complete  PCP or Specialist Appt within 5-7 Days --  Home Care Screening Complete  Medication Review (RN CM) Complete

## 2023-09-08 NOTE — Progress Notes (Signed)
 [facility] PROGRESS NOTE    Progress Note  LEM PEARY  FMW:[id number] DOB: [birth year] DOA: 09/04/2023 PCP: Kip Righter, MD     Brief Narrative:   James Ibarra is an 73 y.o. male past medical history alcohol abuse significant for mitral regurgitation repair, essential hypertension, CAD A-fib status post maze with left atrial appendage clipping on October 2024 was found to to be in the ground.  Was found to have a left hip fracture, CT scan of the abdomen pelvis showed left comminuted femoral fracture   Assessment/Plan:   Subcapital fracture of neck of left femur, closed, initial encounter [facility]) Orthopedic surgery was consulted. MRI done there was a concern for CVA neurology was consulted, they deemed this not to be a stroke, no reason to delay surgery. She is status post total hip replacement on the left. Analgesics and coagulation per orthopedic surgery. PT OT evaluated the patient will need skilled nursing facility placement.  Syncope: Neurology was consulted, recommended risk and benefits are reasonable for the patient to proceed with left hip repair. HgbA1c 5.2, fasting lipid panel HDL greater than 40 LDL less than 60 PT, OT, for post surgical intervention CT of the head and neck pending Transthoracic Echo, pending  Abnormal EKG with elevated troponins: He is asymptomatic cardiology was consulted recommended to keep potassium greater than 3. Patient very functional can complete more than 4 METS. They recommended no further recommendations   Hypokalemia: Continue to be persistently low despite oral repletion will continue repletion replace magnesium  recheck in the morning.  Acute confusional state: Try to minimize narcotics family is at bedside.  EEG showed no evidence of seizures. Use melatonin at night, Seroquel  for agitation. High risk for sundowning and aspiration. Had no events overnight.  New onset Fever: With a Tmax of 100.6, no leukocytosis blood  pressure stable. Continue to monitor fever curve. No cough with eating.  Essential hypertension: Continue metoprolol  and ARB.  Chronic diastolic dysfunction: Appears euvolemic.  CAD without angina: Continue metoprolol  and statins.  Traumatic rhabdomyolysis: Continue IV fluids CK is trending down.  Malnutrition of moderate degree noted  DVT prophylaxis: lovenox  Family Communication:wife Status is: Inpatient Remains inpatient appropriate because: Subcapital femoral fracture    Code Status:     Code Status Orders  (From admission, onward)           Start     Ordered   09/04/23 1855  Full code  Continuous       Question:  By:  Answer:  Other   09/04/23 1856           Code Status History     Date Active Date Inactive Code Status Order ID Comments User Context   05/24/2023 1011 05/24/2023 1827 Full Code [id number]  Nancey Eulas BRAVO, MD Inpatient   12/08/2022 1116 12/14/2022 1657 Full Code [id number]  Lindia Laurel KANDICE DEVONNA Inpatient   10/12/2022 1239 10/12/2022 2007 Full Code [id number]  Wendel Lurena POUR, MD Inpatient   08/12/2022 1602 08/25/2022 2349 Full Code [id number]  Seena Marsa NOVAK, MD ED      Advance Directive Documentation    Flowsheet Row Most Recent Value  Type of Advance Directive Healthcare Power of Attorney, Living will  Pre-existing out of facility DNR order (yellow form or pink MOST form) --  MOST Form in Place? --      IV Access:   Peripheral IV   Procedures and diagnostic studies:   ECHOCARDIOGRAM COMPLETE Result Date: 09/07/2023    ECHOCARDIOGRAM REPORT  Patient Name:   James Ibarra Date of Exam: 09/07/2023 Medical Rec #:  [id number]       Height:       73.0 in Accession #:    [id number]      Weight:       155.0 lb Date of Birth:  26-Apr-1950       BSA:          1.931 m Patient Age:    72 years        BP:           162/88 mmHg Patient Gender: M               HR:           92 bpm. Exam Location:  Inpatient Procedure: 2D Echo, Cardiac  Doppler and Color Doppler (Both Spectral and Color            Flow Doppler were utilized during procedure). Indications:    Stroke  History:        Patient has prior history of Echocardiogram examinations, most                 recent 01/19/2023. Risk Factors:Hypertension.  Sonographer:    Philomena Daring Referring Phys: [id number] DEVON SHAFER IMPRESSIONS  1. Left ventricular ejection fraction, by estimation, is 70 to 75%. The left ventricle has hyperdynamic function. The left ventricle has no regional wall motion abnormalities. Left ventricular diastolic parameters are consistent with Grade I diastolic dysfunction (impaired relaxation).  2. Right ventricular systolic function is normal. The right ventricular size is normal. Tricuspid regurgitation signal is inadequate for assessing PA pressure.  3. S/p mitral valve repair. No evidence of mitral valve regurgitation. No evidence of mitral stenosis.  4. The aortic valve is tricuspid. Aortic valve regurgitation is not visualized. No aortic stenosis is present. Aortic valve mean gradient measures 8.0 mmHg. Aortic valve Vmax measures 1.93 m/s.  5. Aortic dilatation noted. There is mild dilatation of the ascending aorta, measuring 41 mm.  6. The inferior vena cava is normal in size with greater than 50% respiratory variability, suggesting right atrial pressure of 3 mmHg.  7. Suboptimal images for bubble study, inconclusive. Patient is s/p PFO closure in 12/2022.  8. Increased flow velocities may be secondary to anemia, thyrotoxicosis, hyperdynamic or high flow state. FINDINGS  Left Ventricle: Left ventricular ejection fraction, by estimation, is 70 to 75%. The left ventricle has hyperdynamic function. The left ventricle has no regional wall motion abnormalities. Strain was performed and the global longitudinal strain is indeterminate. The left ventricular internal cavity size was normal in size. There is no left ventricular hypertrophy. Left ventricular diastolic parameters are  consistent with Grade I diastolic dysfunction (impaired relaxation). Right Ventricle: The right ventricular size is normal. No increase in right ventricular wall thickness. Right ventricular systolic function is normal. Tricuspid regurgitation signal is inadequate for assessing PA pressure. Left Atrium: Left atrial size was normal in size. Right Atrium: Right atrial size was normal in size. Pericardium: There is no evidence of pericardial effusion. Mitral Valve: The mitral valve is grossly normal. No evidence of mitral valve regurgitation. No evidence of mitral valve stenosis. Tricuspid Valve: The tricuspid valve is normal in structure. Tricuspid valve regurgitation is mild . No evidence of tricuspid stenosis. Aortic Valve: The aortic valve is tricuspid. Aortic valve regurgitation is not visualized. No aortic stenosis is present. Aortic valve mean gradient measures 8.0 mmHg. Aortic valve peak gradient measures 14.9 mmHg. Aortic valve  area, by VTI measures 3.41  cm. Pulmonic Valve: The pulmonic valve was grossly normal. Pulmonic valve regurgitation is trivial. No evidence of pulmonic stenosis. Aorta: The aortic root is normal in size and structure and aortic dilatation noted. There is mild dilatation of the ascending aorta, measuring 41 mm. Venous: The inferior vena cava is normal in size with greater than 50% respiratory variability, suggesting right atrial pressure of 3 mmHg. IAS/Shunts: No atrial level shunt detected by color flow Doppler. Agitated saline contrast was given intravenously to evaluate for intracardiac shunting. Suboptimal images for bubble study, inconclusive. Additional Comments: 3D was performed not requiring image post processing on an independent workstation and was indeterminate.  LEFT VENTRICLE PLAX 2D LVIDd:         4.90 cm   Diastology LVIDs:         3.40 cm   LV e' medial:    8.81 cm/s LV PW:         1.00 cm   LV E/e' medial:  15.7 LV IVS:        1.10 cm   LV e' lateral:   13.10 cm/s LVOT  diam:     2.00 cm   LV E/e' lateral: 10.5 LV SV:         101 LV SV Index:   52 LVOT Area:     3.14 cm  RIGHT VENTRICLE             IVC RV S prime:     14.00 cm/s  IVC diam: 1.30 cm TAPSE (M-mode): 1.9 cm LEFT ATRIUM             Index        RIGHT ATRIUM           Index LA diam:        3.70 cm 1.92 cm/m   RA Area:     20.50 cm LA Vol (A2C):   48.6 ml 25.17 ml/m  RA Volume:   55.20 ml  28.59 ml/m LA Vol (A4C):   45.5 ml 23.57 ml/m LA Biplane Vol: 51.4 ml 26.62 ml/m  AORTIC VALVE AV Area (Vmax):    3.02 cm AV Area (Vmean):   3.13 cm AV Area (VTI):     3.41 cm AV Vmax:           193.00 cm/s AV Vmean:          129.000 cm/s AV VTI:            0.296 m AV Peak Grad:      14.9 mmHg AV Mean Grad:      8.0 mmHg LVOT Vmax:         185.50 cm/s LVOT Vmean:        128.500 cm/s LVOT VTI:          0.321 m LVOT/AV VTI ratio: 1.09  AORTA Ao Root diam: 3.00 cm Ao Asc diam:  4.10 cm MITRAL VALVE MV Area (PHT): 3.37 cm     SHUNTS MV Decel Time: 225 msec     Systemic VTI:  0.32 m MV E velocity: 138.00 cm/s  Systemic Diam: 2.00 cm MV A velocity: 157.00 cm/s MV E/A ratio:  0.88 Vishnu Priya Mallipeddi Electronically signed by Diannah Late Mallipeddi Signature Date/Time: 09/07/2023/11:40:39 AM    Final    CT ANGIO HEAD NECK W WO CM Result Date: 09/06/2023 CLINICAL DATA:  Concern for stroke. EXAM: CT ANGIOGRAPHY HEAD AND NECK WITH AND WITHOUT CONTRAST TECHNIQUE: Multidetector CT imaging  of the head and neck was performed using the standard protocol during bolus administration of intravenous contrast. Multiplanar CT image reconstructions and MIPs were obtained to evaluate the vascular anatomy. Carotid stenosis measurements (when applicable) are obtained utilizing NASCET criteria, using the distal internal carotid diameter as the denominator. RADIATION DOSE REDUCTION: This exam was performed according to the departmental dose-optimization program which includes automated exposure control, adjustment of the mA and/or kV according to  patient size and/or use of iterative reconstruction technique. CONTRAST:  75mL OMNIPAQUE  IOHEXOL  350 MG/ML SOLN COMPARISON:  CT head and MRI head 09/04/2023. FINDINGS: CT HEAD FINDINGS Brain: No acute intracranial hemorrhage. No CT evidence of acute infarct. Nonspecific hypoattenuation in the periventricular and subcortical white matter favored to reflect chronic microvascular ischemic changes. No edema, mass effect, or midline shift. The basilar cisterns are patent. 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 Ventricles: Prominence of the ventricles suggesting underlying parenchymal volume loss. Vascular: No hyperdense vessel or unexpected calcification. Skull: No acute or aggressive finding. Orbits: Bilateral lens replacement.  Scleral buckle on the left. Sinuses: The visualized paranasal sinuses are clear. Other: Mastoid air cells are clear. CTA NECK FINDINGS Aortic arch: Standard configuration of the aortic arch. Imaged portion shows no evidence of aneurysm or dissection. No significant stenosis of the major arch vessel origins. Pulmonary arteries: As permitted by contrast timing, there are no filling defects in the visualized pulmonary arteries. Subclavian arteries: The subclavian arteries are patent bilaterally. Right carotid system: No evidence of dissection, stenosis (50% or greater), or occlusion. Minimal atherosclerosis at the carotid bifurcation. Tortuosity and ectasia of the distal cervical ICA. Left carotid system: No evidence of dissection, stenosis (50% or greater), or occlusion. Tortuosity and ectasia of the distal cervical ICA. Vertebral arteries: Codominant. No evidence of dissection, stenosis (50% or greater), or occlusion. Skeleton: No acute findings. Degenerative changes in the cervical spine. Sequelae of median sternotomy. Other neck: The visualized airway is patent. No cervical lymphadenopathy. Upper chest: Visualized lung apices are  clear. Review of the MIP images confirms the above findings CTA HEAD FINDINGS ANTERIOR CIRCULATION: The intracranial ICAs are patent bilaterally. No significant stenosis, proximal occlusion, aneurysm, or vascular malformation. MCAs: The middle cerebral arteries are patent bilaterally. ACAs: The anterior cerebral arteries are patent bilaterally. POSTERIOR CIRCULATION: No significant stenosis, proximal occlusion, aneurysm, or vascular malformation. PCAs: The posterior cerebral arteries are patent bilaterally. Pcomm: The posterior communicating arteries are visualized bilaterally. SCAs: The superior cerebellar arteries are patent bilaterally. Basilar artery: Patent AICAs: Patent PICAs: Patent Vertebral arteries: The intracranial vertebral arteries are patent. Venous sinuses: As permitted by contrast timing, patent. Anatomic variants: None Review of the MIP images confirms the above findings IMPRESSION: No large vessel occlusion. No high-grade stenosis, aneurysm, or dissection of the arteries in the head and neck. Ectasia and tortuosity of the distal cervical ICAs which may be related to hypertension. No CT evidence of acute intracranial abnormality. Electronically Signed   By: Donnice Mania M.D.   On: 09/06/2023 18:27   EEG adult Result Date: 09/06/2023 Shelton Arlin KIDD, MD     09/06/2023 12:26 PM Patient Name: James Ibarra MRN: [id number] Epilepsy Attending: Arlin KIDD Shelton Referring Physician/Provider: Rosemarie Eather RAMAN, MD Date: 09/06/2023 Duration: 25.38 mins Patient history: 73yo M with syncope. EEG to evaluate for seizure. Level of alertness: Awake, asleep AEDs during EEG study: None Technical aspects: This EEG study was done with scalp electrodes positioned according to the 10-20 International system of electrode placement.  Electrical activity was reviewed with band pass filter of 1-70Hz , sensitivity of 7 uV/mm, display speed of 21mm/sec with a 60Hz  notched filter applied as appropriate. EEG data were recorded  continuously and digitally stored.  Video monitoring was available and reviewed as appropriate. Description: The posterior dominant rhythm consists of 8 Hz activity of moderate voltage (25-35 uV) seen predominantly in posterior head regions, symmetric and reactive to eye opening and eye closing. Sleep was characterized by vertex waves, sleep spindles (12 to 14 Hz), maximal frontocentral region. Hyperventilation and photic stimulation were not performed.   IMPRESSION: This study is within normal limits. No seizures or epileptiform discharges were seen throughout the recording. A normal interictal EEG does not exclude the diagnosis of epilepsy. Arlin MALVA Krebs     Medical Consultants:   None.   Subjective:    James Ibarra no complaints today.  Objective:    Vitals:   09/07/23 0737 09/07/23 1455 09/07/23 2013 09/08/23 0453  BP: (!) 162/88 (!) 143/86 (!) 179/88 (!) 156/79  Pulse: 74 79 76 66  Resp: 15 16 15 15   Temp: 100 F (37.8 C) 98.9 F (37.2 C) 98.4 F (36.9 C) 98.6 F (37 C)  TempSrc:   Oral Oral  SpO2: 97% 96% 98% 100%  Weight:      Height:       SpO2: 100 %   Intake/Output Summary (Last 24 hours) at 09/08/2023 0833 Last data filed at 09/08/2023 9350 Gross per 24 hour  Intake 405 ml  Output 400 ml  Net 5 ml   Filed Weights   09/04/23 1516 09/05/23 1248  Weight: 73 kg 70.3 kg    Exam: General exam: In no acute distress. Respiratory system: Good air movement and clear to auscultation. Cardiovascular system: S1 & S2 heard, RRR. No JVD. Gastrointestinal system: Abdomen is nondistended, soft and nontender.  Extremities: No pedal edema. Skin: No rashes, lesions or ulcers Psychiatry: Judgement and insight appear normal. Mood & affect appropriate.  Data Reviewed:    Labs: Basic Metabolic Panel: Recent Labs  Lab 09/04/23 1550 09/04/23 1604 09/05/23 0535 09/05/23 1739 09/06/23 0554 09/07/23 0548 09/08/23 0337  NA 138 141 137  --  138 140 138  K 3.2*  3.2* 3.0*  --  3.0* 3.4* 3.2*  CL 101 103 98  --  101 103 101  CO2 24  --  29  --  25 25 26   GLUCOSE 127* 125* 117*  --  108* 88 101*  BUN 16 18 13   --  13 12 7*  CREATININE 0.84 0.70 0.73 0.76 0.74 0.68 0.63  CALCIUM  8.9  --  8.5*  --  8.5* 8.7* 8.8*  MG 2.0  --   --   --   --   --   --    GFR Estimated Creatinine Clearance: 83 mL/min (by C-G formula based on SCr of 0.63 mg/dL). Liver Function Tests: Recent Labs  Lab 09/04/23 1550  AST 46*  ALT 26  ALKPHOS 62  BILITOT 2.3*  PROT 6.6  ALBUMIN  3.8   No results for input(s): LIPASE, AMYLASE in the last 168 hours. No results for input(s): AMMONIA in the last 168 hours. Coagulation profile Recent Labs  Lab 09/04/23 1550  INR 1.0   COVID-19 Labs  No results for input(s): DDIMER, FERRITIN, LDH, CRP in the last 72 hours.  Lab Results  Component Value Date   SARSCOV2NAA NEGATIVE 12/06/2022   SARSCOV2NAA NEGATIVE 08/12/2022    CBC: Recent Labs  Lab 09/05/23 0535 09/05/23 1739 09/06/23 0554 09/07/23 0548 09/08/23 0337  WBC 10.5 12.1* 12.8* 9.6 9.0  HGB 12.6* 12.3* 12.2* 11.6* 10.9*  HCT 36.4* 35.9* 36.0* 34.4* 31.4*  MCV 88.6 87.8 89.1 91.2 89.7  PLT 156 138* 146* 145* 174   Cardiac Enzymes: Recent Labs  Lab 09/04/23 1550 09/06/23 1156  CKTOTAL 780* 639*   BNP (last 3 results) No results for input(s): PROBNP in the last 8760 hours. CBG: No results for input(s): GLUCAP in the last 168 hours. D-Dimer: No results for input(s): DDIMER in the last 72 hours. Hgb A1c: Recent Labs    09/06/23 0817  HGBA1C 5.2   Lipid Profile: Recent Labs    09/06/23 0817  CHOL 136  HDL 59  LDLCALC 57  TRIG 101  CHOLHDL 2.3   Thyroid  function studies: No results for input(s): TSH, T4TOTAL, T3FREE, THYROIDAB in the last 72 hours.  Invalid input(s): FREET3 Anemia work up: No results for input(s): VITAMINB12, FOLATE, FERRITIN, TIBC, IRON , RETICCTPCT in the last 72  hours. Sepsis Labs: Recent Labs  Lab 09/04/23 1604 09/05/23 0535 09/05/23 1739 09/06/23 0554 09/07/23 0548 09/08/23 0337  WBC  --    < > 12.1* 12.8* 9.6 9.0  LATICACIDVEN 3.0*  --   --   --   --   --    < > = values in this interval not displayed.   Microbiology Recent Results (from the past 240 hours)  Surgical PCR screen     Status: None   Collection Time: 09/05/23 12:13 AM   Specimen: Nasal Mucosa; Nasal Swab  Result Value Ref Range Status   MRSA, PCR NEGATIVE NEGATIVE Final   Staphylococcus aureus NEGATIVE NEGATIVE Final    Comment: (NOTE) The Xpert SA Assay (FDA approved for NASAL specimens in patients 107 years of age and older), is one component of a comprehensive surveillance program. It is not intended to diagnose infection nor to guide or monitor treatment. Performed at [facility], [address] [place], [place] [address]      Medications:    aspirin  EC  81 mg Oral Daily   docusate sodium   100 mg Oral BID   enoxaparin  (LOVENOX ) injection  40 mg Subcutaneous Q24H   feeding supplement  237 mL Oral BID BM   irbesartan   150 mg Oral Daily   magnesium  oxide  400 mg Oral Q2H   melatonin  3 mg Oral QHS   metoprolol  tartrate  50 mg Oral BID   multivitamin with minerals  1 tablet Oral Daily   mupirocin  ointment  1 Application Nasal BID   polyethylene glycol  17 g Oral BID   potassium chloride   40 mEq Oral Q2H   rosuvastatin   20 mg Oral Daily   [START ON 09/14/2023] thiamine  (VITAMIN B1) injection  100 mg Intravenous Daily   tranexamic acid  (CYKLOKAPRON ) 2,000 mg in sodium chloride  0.9 % 50 mL Topical Application  2,000 mg Topical Once   Continuous Infusions:  thiamine  (VITAMIN B1) injection Stopped (09/07/23 2125)   Followed by   thiamine  (VITAMIN B1) injection        LOS: 4 days   Erle Odell Castor  [facility]  09/08/2023, 8:33 AM

## 2023-09-09 DIAGNOSIS — S72012A Unspecified intracapsular fracture of left femur, initial encounter for closed fracture: Secondary | ICD-10-CM | POA: Diagnosis not present

## 2023-09-09 LAB — BASIC METABOLIC PANEL WITH GFR
Anion gap: 11 (ref 5–15)
BUN: 14 mg/dL (ref 8–23)
CO2: 22 mmol/L (ref 22–32)
Calcium: 9.3 mg/dL (ref 8.9–10.3)
Chloride: 102 mmol/L (ref 98–111)
Creatinine, Ser: 0.59 mg/dL — ABNORMAL LOW (ref 0.61–1.24)
GFR, Estimated: >60 mL/min (ref >60)
Glucose, Bld: 140 mg/dL — ABNORMAL HIGH (ref 70–99)
Potassium: 3.9 mmol/L (ref 3.5–5.1)
Sodium: 135 mmol/L (ref 135–145)

## 2023-09-09 NOTE — Plan of Care (Signed)

## 2023-09-09 NOTE — Progress Notes (Signed)
 [facility] PROGRESS NOTE    Progress Note  James Ibarra  FMW:[id number] DOB: 1950/05/25 DOA: 09/04/2023 PCP: Kip Righter, MD     Brief Narrative:   James Ibarra is an 73 y.o. male past medical history alcohol abuse significant for mitral regurgitation repair, essential hypertension, CAD A-fib status post maze with left atrial appendage clipping on October 2024 was found to to be in the ground.  Was found to have a left hip fracture, CT scan of the abdomen pelvis showed left comminuted femoral fracture   Assessment/Plan:   Subcapital fracture of neck of left femur, closed, initial encounter [facility]) Orthopedic surgery was consulted. MRI done there was a concern for CVA neurology was consulted, they deemed this not to be a stroke, no reason to delay surgery. She is status post total hip replacement on the left. Analgesics and coagulation per orthopedic surgery. PT OT evaluated the patient will need skilled nursing facility placement.  Syncope: Neurology was consulted, recommended risk and benefits are reasonable for the patient to proceed with left hip repair. HgbA1c 5.2, fasting lipid panel HDL greater than 40 LDL less than 60 PT, OT, evaluated the patient recommended rehab CT of the head and neck pending Transthoracic Echo, pending  Abnormal EKG with elevated troponins: He is asymptomatic cardiology was consulted recommended to keep potassium greater than 4. Patient very functional can complete more than 4 METS. They recommended no further recommendations   Hypokalemia: Repleted.  Mag is 1.9. Basic metabolic panels pending this morning.  Acute confusional state: Try to minimize narcotics family is at bedside.  EEG showed no evidence of seizures. Use melatonin at night, Seroquel  for agitation. High risk for sundowning and aspiration. Had no events overnight.  New onset Fever: With a Tmax of Tmax of 99.5 no leukocytosis blood pressure stable. Continue to  monitor fever curve. No cough with eating. Of unclear source now resolved  Essential hypertension: Continue metoprolol  and ARB.  Chronic diastolic dysfunction: Appears euvolemic.  CAD without angina: Continue metoprolol  and statins.  Traumatic rhabdomyolysis: Resolved with IV fluid hydration.  Malnutrition of moderate degree noted  DVT prophylaxis: lovenox  Family Communication:wife Status is: Inpatient Remains inpatient appropriate because: Subcapital femoral fracture    Code Status:     Code Status Orders  (From admission, onward)           Start     Ordered   09/04/23 1855  Full code  Continuous       Question:  By:  Answer:  Other   09/04/23 1856           Code Status History     Date Active Date Inactive Code Status Order ID Comments User Context   05/24/2023 1011 05/24/2023 1827 Full Code [id number]  Nancey Eulas BRAVO, MD Inpatient   12/08/2022 1116 12/14/2022 1657 Full Code [id number]  Lindia Laurel KANDICE DEVONNA Inpatient   10/12/2022 1239 10/12/2022 2007 Full Code [id number]  Wendel Lurena POUR, MD Inpatient   08/12/2022 1602 08/25/2022 2349 Full Code [id number]  Seena Marsa NOVAK, MD ED      Advance Directive Documentation    Flowsheet Row Most Recent Value  Type of Advance Directive Healthcare Power of Attorney, Living will  Pre-existing out of facility DNR order (yellow form or pink MOST form) --  MOST Form in Place? --      IV Access:   Peripheral IV   Procedures and diagnostic studies:   ECHOCARDIOGRAM COMPLETE Result Date: 09/07/2023    ECHOCARDIOGRAM REPORT  Patient Name:   James Ibarra Date of Exam: 09/07/2023 Medical Rec #:  [id number]       Height:       73.0 in Accession #:    [id number]      Weight:       155.0 lb Date of Birth:  1950-11-19       BSA:          1.931 m Patient Age:    72 years        BP:           162/88 mmHg Patient Gender: M               HR:           92 bpm. Exam Location:  Inpatient Procedure: 2D Echo, Cardiac  Doppler and Color Doppler (Both Spectral and Color            Flow Doppler were utilized during procedure). Indications:    Stroke  History:        Patient has prior history of Echocardiogram examinations, most                 recent 01/19/2023. Risk Factors:Hypertension.  Sonographer:    Philomena Daring Referring Phys: [id number] DEVON SHAFER IMPRESSIONS  1. Left ventricular ejection fraction, by estimation, is 70 to 75%. The left ventricle has hyperdynamic function. The left ventricle has no regional wall motion abnormalities. Left ventricular diastolic parameters are consistent with Grade I diastolic dysfunction (impaired relaxation).  2. Right ventricular systolic function is normal. The right ventricular size is normal. Tricuspid regurgitation signal is inadequate for assessing PA pressure.  3. S/p mitral valve repair. No evidence of mitral valve regurgitation. No evidence of mitral stenosis.  4. The aortic valve is tricuspid. Aortic valve regurgitation is not visualized. No aortic stenosis is present. Aortic valve mean gradient measures 8.0 mmHg. Aortic valve Vmax measures 1.93 m/s.  5. Aortic dilatation noted. There is mild dilatation of the ascending aorta, measuring 41 mm.  6. The inferior vena cava is normal in size with greater than 50% respiratory variability, suggesting right atrial pressure of 3 mmHg.  7. Suboptimal images for bubble study, inconclusive. Patient is s/p PFO closure in 12/2022.  8. Increased flow velocities may be secondary to anemia, thyrotoxicosis, hyperdynamic or high flow state. FINDINGS  Left Ventricle: Left ventricular ejection fraction, by estimation, is 70 to 75%. The left ventricle has hyperdynamic function. The left ventricle has no regional wall motion abnormalities. Strain was performed and the global longitudinal strain is indeterminate. The left ventricular internal cavity size was normal in size. There is no left ventricular hypertrophy. Left ventricular diastolic parameters are  consistent with Grade I diastolic dysfunction (impaired relaxation). Right Ventricle: The right ventricular size is normal. No increase in right ventricular wall thickness. Right ventricular systolic function is normal. Tricuspid regurgitation signal is inadequate for assessing PA pressure. Left Atrium: Left atrial size was normal in size. Right Atrium: Right atrial size was normal in size. Pericardium: There is no evidence of pericardial effusion. Mitral Valve: The mitral valve is grossly normal. No evidence of mitral valve regurgitation. No evidence of mitral valve stenosis. Tricuspid Valve: The tricuspid valve is normal in structure. Tricuspid valve regurgitation is mild . No evidence of tricuspid stenosis. Aortic Valve: The aortic valve is tricuspid. Aortic valve regurgitation is not visualized. No aortic stenosis is present. Aortic valve mean gradient measures 8.0 mmHg. Aortic valve peak gradient measures 14.9 mmHg. Aortic valve  area, by VTI measures 3.41  cm. Pulmonic Valve: The pulmonic valve was grossly normal. Pulmonic valve regurgitation is trivial. No evidence of pulmonic stenosis. Aorta: The aortic root is normal in size and structure and aortic dilatation noted. There is mild dilatation of the ascending aorta, measuring 41 mm. Venous: The inferior vena cava is normal in size with greater than 50% respiratory variability, suggesting right atrial pressure of 3 mmHg. IAS/Shunts: No atrial level shunt detected by color flow Doppler. Agitated saline contrast was given intravenously to evaluate for intracardiac shunting. Suboptimal images for bubble study, inconclusive. Additional Comments: 3D was performed not requiring image post processing on an independent workstation and was indeterminate.  LEFT VENTRICLE PLAX 2D LVIDd:         4.90 cm   Diastology LVIDs:         3.40 cm   LV e' medial:    8.81 cm/s LV PW:         1.00 cm   LV E/e' medial:  15.7 LV IVS:        1.10 cm   LV e' lateral:   13.10 cm/s LVOT  diam:     2.00 cm   LV E/e' lateral: 10.5 LV SV:         101 LV SV Index:   52 LVOT Area:     3.14 cm  RIGHT VENTRICLE             IVC RV S prime:     14.00 cm/s  IVC diam: 1.30 cm TAPSE (M-mode): 1.9 cm LEFT ATRIUM             Index        RIGHT ATRIUM           Index LA diam:        3.70 cm 1.92 cm/m   RA Area:     20.50 cm LA Vol (A2C):   48.6 ml 25.17 ml/m  RA Volume:   55.20 ml  28.59 ml/m LA Vol (A4C):   45.5 ml 23.57 ml/m LA Biplane Vol: 51.4 ml 26.62 ml/m  AORTIC VALVE AV Area (Vmax):    3.02 cm AV Area (Vmean):   3.13 cm AV Area (VTI):     3.41 cm AV Vmax:           193.00 cm/s AV Vmean:          129.000 cm/s AV VTI:            0.296 m AV Peak Grad:      14.9 mmHg AV Mean Grad:      8.0 mmHg LVOT Vmax:         185.50 cm/s LVOT Vmean:        128.500 cm/s LVOT VTI:          0.321 m LVOT/AV VTI ratio: 1.09  AORTA Ao Root diam: 3.00 cm Ao Asc diam:  4.10 cm MITRAL VALVE MV Area (PHT): 3.37 cm     SHUNTS MV Decel Time: 225 msec     Systemic VTI:  0.32 m MV E velocity: 138.00 cm/s  Systemic Diam: 2.00 cm MV A velocity: 157.00 cm/s MV E/A ratio:  0.88 Vishnu Priya Mallipeddi Electronically signed by Diannah Late Mallipeddi Signature Date/Time: 09/07/2023/11:40:39 AM    Final      Medical Consultants:   None.   Subjective:    TREYLON HENARD relates pain is controlled had a bowel movement.  Objective:    Vitals:  09/08/23 1557 09/08/23 1900 09/08/23 2100 09/09/23 0600  BP: (!) 152/90 (!) 162/87 (!) 159/90 (!) 155/90  Pulse: 80 74  77  Resp: 18 18  18   Temp: 98.6 F (37 C) (!) 97.1 F (36.2 C)  98.7 F (37.1 C)  TempSrc: Oral Oral  Oral  SpO2: 99% 99%    Weight:      Height:       SpO2: 99 %   Intake/Output Summary (Last 24 hours) at 09/09/2023 0741 Last data filed at 09/08/2023 2000 Gross per 24 hour  Intake 52.5 ml  Output 400 ml  Net -347.5 ml   Filed Weights   09/04/23 1516 09/05/23 1248  Weight: 73 kg 70.3 kg    Exam: General exam: In no acute  distress. Respiratory system: Good air movement and clear to auscultation. Cardiovascular system: S1 & S2 heard, RRR. No JVD. Gastrointestinal system: Abdomen is nondistended, soft and nontender.  Extremities: No pedal edema. Skin: No rashes, lesions or ulcers Psychiatry: Judgement and insight appear normal. Mood & affect appropriate.  Data Reviewed:    Labs: Basic Metabolic Panel: Recent Labs  Lab 09/04/23 1550 09/04/23 1604 09/05/23 0535 09/05/23 1739 09/06/23 0554 09/07/23 0548 09/08/23 0337 09/08/23 0915  NA 138 141 137  --  138 140 138  --   K 3.2* 3.2* 3.0*  --  3.0* 3.4* 3.2*  --   CL 101 103 98  --  101 103 101  --   CO2 24  --  29  --  25 25 26   --   GLUCOSE 127* 125* 117*  --  108* 88 101*  --   BUN 16 18 13   --  13 12 7*  --   CREATININE 0.84 0.70 0.73 0.76 0.74 0.68 0.63  --   CALCIUM  8.9  --  8.5*  --  8.5* 8.7* 8.8*  --   MG 2.0  --   --   --   --   --   --  1.9   GFR Estimated Creatinine Clearance: 83 mL/min (by C-G formula based on SCr of 0.63 mg/dL). Liver Function Tests: Recent Labs  Lab 09/04/23 1550  AST 46*  ALT 26  ALKPHOS 62  BILITOT 2.3*  PROT 6.6  ALBUMIN  3.8   No results for input(s): LIPASE, AMYLASE in the last 168 hours. No results for input(s): AMMONIA in the last 168 hours. Coagulation profile Recent Labs  Lab 09/04/23 1550  INR 1.0   COVID-19 Labs  No results for input(s): DDIMER, FERRITIN, LDH, CRP in the last 72 hours.  Lab Results  Component Value Date   SARSCOV2NAA NEGATIVE 12/06/2022   SARSCOV2NAA NEGATIVE 08/12/2022    CBC: Recent Labs  Lab 09/05/23 0535 09/05/23 1739 09/06/23 0554 09/07/23 0548 09/08/23 0337  WBC 10.5 12.1* 12.8* 9.6 9.0  HGB 12.6* 12.3* 12.2* 11.6* 10.9*  HCT 36.4* 35.9* 36.0* 34.4* 31.4*  MCV 88.6 87.8 89.1 91.2 89.7  PLT 156 138* 146* 145* 174   Cardiac Enzymes: Recent Labs  Lab 09/04/23 1550 09/06/23 1156  CKTOTAL 780* 639*   BNP (last 3 results) No results  for input(s): PROBNP in the last 8760 hours. CBG: No results for input(s): GLUCAP in the last 168 hours. D-Dimer: No results for input(s): DDIMER in the last 72 hours. Hgb A1c: Recent Labs    09/06/23 0817  HGBA1C 5.2   Lipid Profile: Recent Labs    09/06/23 0817  CHOL 136  HDL 59  LDLCALC 57  TRIG 101  CHOLHDL 2.3   Thyroid  function studies: No results for input(s): TSH, T4TOTAL, T3FREE, THYROIDAB in the last 72 hours.  Invalid input(s): FREET3 Anemia work up: No results for input(s): VITAMINB12, FOLATE, FERRITIN, TIBC, IRON , RETICCTPCT in the last 72 hours. Sepsis Labs: Recent Labs  Lab 09/04/23 1604 09/05/23 0535 09/05/23 1739 09/06/23 0554 09/07/23 0548 09/08/23 0337  WBC  --    < > 12.1* 12.8* 9.6 9.0  LATICACIDVEN 3.0*  --   --   --   --   --    < > = values in this interval not displayed.   Microbiology Recent Results (from the past 240 hours)  Surgical PCR screen     Status: None   Collection Time: 09/05/23 12:13 AM   Specimen: Nasal Mucosa; Nasal Swab  Result Value Ref Range Status   MRSA, PCR NEGATIVE NEGATIVE Final   Staphylococcus aureus NEGATIVE NEGATIVE Final    Comment: (NOTE) The Xpert SA Assay (FDA approved for NASAL specimens in patients 57 years of age and older), is one component of a comprehensive surveillance program. It is not intended to diagnose infection nor to guide or monitor treatment. Performed at [facility], [address] [place], [place] [address]      Medications:    aspirin  EC  81 mg Oral Daily   docusate sodium   100 mg Oral BID   enoxaparin  (LOVENOX ) injection  40 mg Subcutaneous Q24H   feeding supplement  237 mL Oral BID BM   irbesartan   150 mg Oral Daily   melatonin  3 mg Oral QHS   metoprolol  tartrate  50 mg Oral BID   multivitamin with minerals  1 tablet Oral Daily   mupirocin  ointment  1 Application Nasal BID   rosuvastatin   20 mg Oral Daily   [START ON 09/14/2023]  thiamine  (VITAMIN B1) injection  100 mg Intravenous Daily   tranexamic acid  (CYKLOKAPRON ) 2,000 mg in sodium chloride  0.9 % 50 mL Topical Application  2,000 mg Topical Once   Continuous Infusions:  thiamine  (VITAMIN B1) injection 250 mg (09/08/23 1441)      LOS: 5 days   Erle Odell Castor  [facility]  09/09/2023, 7:41 AM

## 2023-09-10 ENCOUNTER — Ambulatory Visit

## 2023-09-10 DIAGNOSIS — R2681 Unsteadiness on feet: Secondary | ICD-10-CM | POA: Diagnosis not present

## 2023-09-10 DIAGNOSIS — R451 Restlessness and agitation: Secondary | ICD-10-CM | POA: Diagnosis not present

## 2023-09-10 DIAGNOSIS — R7989 Other specified abnormal findings of blood chemistry: Secondary | ICD-10-CM | POA: Diagnosis not present

## 2023-09-10 DIAGNOSIS — S72001D Fracture of unspecified part of neck of right femur, subsequent encounter for closed fracture with routine healing: Secondary | ICD-10-CM | POA: Diagnosis not present

## 2023-09-10 DIAGNOSIS — I2510 Atherosclerotic heart disease of native coronary artery without angina pectoris: Secondary | ICD-10-CM | POA: Diagnosis not present

## 2023-09-10 DIAGNOSIS — S72002D Fracture of unspecified part of neck of left femur, subsequent encounter for closed fracture with routine healing: Secondary | ICD-10-CM | POA: Diagnosis not present

## 2023-09-10 DIAGNOSIS — I4891 Unspecified atrial fibrillation: Secondary | ICD-10-CM | POA: Diagnosis not present

## 2023-09-10 DIAGNOSIS — M25552 Pain in left hip: Secondary | ICD-10-CM | POA: Diagnosis not present

## 2023-09-10 DIAGNOSIS — S72012A Unspecified intracapsular fracture of left femur, initial encounter for closed fracture: Secondary | ICD-10-CM | POA: Diagnosis not present

## 2023-09-10 DIAGNOSIS — I10 Essential (primary) hypertension: Secondary | ICD-10-CM | POA: Diagnosis not present

## 2023-09-10 DIAGNOSIS — R5381 Other malaise: Secondary | ICD-10-CM | POA: Diagnosis not present

## 2023-09-10 DIAGNOSIS — Z96642 Presence of left artificial hip joint: Secondary | ICD-10-CM | POA: Diagnosis not present

## 2023-09-10 DIAGNOSIS — R1310 Dysphagia, unspecified: Secondary | ICD-10-CM | POA: Diagnosis not present

## 2023-09-10 DIAGNOSIS — S72092D Other fracture of head and neck of left femur, subsequent encounter for closed fracture with routine healing: Secondary | ICD-10-CM | POA: Diagnosis not present

## 2023-09-10 DIAGNOSIS — S72001A Fracture of unspecified part of neck of right femur, initial encounter for closed fracture: Secondary | ICD-10-CM | POA: Diagnosis not present

## 2023-09-10 DIAGNOSIS — G500 Trigeminal neuralgia: Secondary | ICD-10-CM | POA: Diagnosis not present

## 2023-09-10 DIAGNOSIS — Z9181 History of falling: Secondary | ICD-10-CM | POA: Diagnosis not present

## 2023-09-10 DIAGNOSIS — F1011 Alcohol abuse, in remission: Secondary | ICD-10-CM

## 2023-09-10 DIAGNOSIS — R011 Cardiac murmur, unspecified: Secondary | ICD-10-CM | POA: Diagnosis not present

## 2023-09-10 DIAGNOSIS — R2689 Other abnormalities of gait and mobility: Secondary | ICD-10-CM | POA: Diagnosis not present

## 2023-09-10 DIAGNOSIS — M6281 Muscle weakness (generalized): Secondary | ICD-10-CM | POA: Diagnosis not present

## 2023-09-10 DIAGNOSIS — Z85828 Personal history of other malignant neoplasm of skin: Secondary | ICD-10-CM | POA: Diagnosis not present

## 2023-09-10 DIAGNOSIS — E785 Hyperlipidemia, unspecified: Secondary | ICD-10-CM | POA: Diagnosis not present

## 2023-09-10 DIAGNOSIS — M6282 Rhabdomyolysis: Secondary | ICD-10-CM | POA: Diagnosis not present

## 2023-09-10 DIAGNOSIS — E441 Mild protein-calorie malnutrition: Secondary | ICD-10-CM | POA: Diagnosis not present

## 2023-09-10 DIAGNOSIS — E440 Moderate protein-calorie malnutrition: Secondary | ICD-10-CM | POA: Diagnosis not present

## 2023-09-10 DIAGNOSIS — E876 Hypokalemia: Secondary | ICD-10-CM

## 2023-09-10 MED ORDER — ASPIRIN 81 MG PO TBEC: 81.0000 mg | DELAYED_RELEASE_TABLET | Freq: Every day | ORAL | Status: AC

## 2023-09-10 NOTE — Discharge Summary (Signed)
 Physician Discharge Summary  James Ibarra FMW:[id number] DOB: Aug 25, 1950 DOA: 09/04/2023  PCP: Kip Righter, MD  Admit date: 09/04/2023 Discharge date: 09/10/2023  Admitted From: Home Disposition:  SNF  Recommendations for Outpatient Follow-up:  Follow up with PCP in 1-2 weeks Please obtain BMP/CBC in one week   Home Health:No Equipment/Devices:None  Discharge Condition:Stable CODE STATUS:Full Diet recommendation: Heart Healthy  Brief/Interim Summary:  73 y.o. male past medical history alcohol abuse significant for mitral regurgitation repair, essential hypertension, CAD A-fib status post maze with left atrial appendage clipping on October 2024 was found to to be in the ground.  Was found to have a left hip fracture, CT scan of the abdomen pelvis showed left comminuted femoral fracture   Discharge Diagnoses:  Principal Problem:   Subcapital fracture of neck of left femur, closed, initial encounter [facility]) Active Problems:   Fall at home, initial encounter   Malnutrition of moderate degree  Subcapital fracture of the left femoral neck: Orthopedic surgery was consulted recommended surgical intervention status post total hip replacement. The recommended Lovenox  and narcotics for pain control. PT OT evaluated the patient he will be going to skilled nursing facility.  Syncope: MRI was done that showed a probable concern of CVA, neurology was consulted who deemed is not a CVA. They agreed to proceed with surgery. A1c of 5.2. CTA of the head and neck showed no LVO. 2D echo showed an EF of 70% and wall motion abnormality grade 1 diastolic dysfunction.  Abnormal EKG/elevated troponin/chronic diastolic dysfunction: Currently asymptomatic cardiology was consulted and they recommended no further ischemic evaluation okay to proceed with surgical intervention.  Hypokalemia: Repleted now improved.  Acute confusional state: Started on melatonin at night now resolved.  New onset of  fevers: Fever curve resolved no leukocytosis. Question atelectasis.  Central hypertension: No changes made to his medication.  CAD without angina: Continue statin and metoprolol .  Traumatic rhabdomyolysis: Resolved with IV fluids.  Discharge Instructions  Discharge Instructions     Diet - low sodium heart healthy   Complete by: As directed    Increase activity slowly   Complete by: As directed    No wound care   Complete by: As directed       Allergies as of 09/10/2023   No Known Allergies      Medication List     STOP taking these medications    amoxicillin 500 MG capsule Commonly known as: AMOXIL   ketoconazole 2 % cream Commonly known as: NIZORAL   potassium chloride  10 MEQ tablet Commonly known as: KLOR-CON        TAKE these medications    acetaminophen  325 MG tablet Commonly known as: TYLENOL  Take 2 tablets (650 mg total) by mouth every 6 (six) hours. What changed:  when to take this reasons to take this   aspirin  EC 81 MG tablet Take 1 tablet (81 mg total) by mouth daily. Swallow whole. Start taking on: September 11, 2023   enoxaparin  40 MG/0.4ML injection Commonly known as: LOVENOX  Inject 0.4 mLs (40 mg total) into the skin daily for 14 days.   fluorouracil 5 % cream Commonly known as: EFUDEX Apply 1 Application topically 2 (two) times daily.   folic acid  1 MG tablet Commonly known as: FOLVITE  Take 1 mg by mouth daily.   gabapentin  300 MG capsule Commonly known as: NEURONTIN  TAKE 2 CAPSULES BY MOUTH 4 TIMES DAILY   HYDROcodone -acetaminophen  5-325 MG tablet Commonly known as: NORCO/VICODIN Take 1 tablet by mouth every 6 (six)  hours as needed for moderate pain (pain score 4-6).   hydrocortisone 2.5 % ointment Apply 1 Application topically 2 (two) times daily as needed (itching).   Magnesium  400 MG Tabs Take 400 mg by mouth daily.   melatonin 3 MG Tabs tablet Take 3 mg by mouth at bedtime as needed (sleep).   metoprolol  tartrate 50  MG tablet Commonly known as: LOPRESSOR  Take 1 tablet (50 mg total) by mouth 2 (two) times daily.   PreserVision AREDS 2 Caps Take 1 capsule by mouth 2 (two) times daily.   rosuvastatin  20 MG tablet Commonly known as: CRESTOR  Take 1 tablet (20 mg total) by mouth daily.   sildenafil 20 MG tablet Commonly known as: REVATIO Take 40-60 mg by mouth daily as needed (ED).   SYSTANE OP Place 1 drop into both eyes daily as needed (dry eyes).   telmisartan  40 MG tablet Commonly known as: MICARDIS  Take 1 tablet (40 mg total) by mouth daily.   thiamine  100 MG tablet Commonly known as: Vitamin B-1 Take 100 mg by mouth daily.   traZODone  100 MG tablet Commonly known as: DESYREL  Take 100 mg by mouth at bedtime.   VITAMIN C PO Take 750 mg by mouth daily.   Vitamin D 50 MCG (2000 UT) tablet Take 2,000 Units by mouth daily.        Contact information for follow-up providers     Jule Ronal CROME, PA-C Follow up.   Specialty: Orthopedic Surgery Why: For suture removal, For wound re-check Contact information: [address] [place]  [place] [address] [phone]              Contact information for after-discharge care     Destination     [facility] .   Service: Skilled Nursing Contact information: [address]  [address] [phone]                    No Known Allergies  Consultations: Tabetic surgery Neurology Cardiology   Procedures/Studies: ECHOCARDIOGRAM COMPLETE Result Date: 09/07/2023    ECHOCARDIOGRAM REPORT   Patient Name:   James Ibarra Date of Exam: 09/07/2023 Medical Rec #:  [id number]       Height:       73.0 in Accession #:    [id number]      Weight:       155.0 lb Date of Birth:  1950/04/20       BSA:          1.931 m Patient Age:    72 years        BP:           162/88 mmHg Patient Gender: M               HR:           92 bpm. Exam Location:  Inpatient Procedure: 2D Echo, Cardiac Doppler and Color Doppler  (Both Spectral and Color            Flow Doppler were utilized during procedure). Indications:    Stroke  History:        Patient has prior history of Echocardiogram examinations, most                 recent 01/19/2023. Risk Factors:Hypertension.  Sonographer:    Philomena Daring Referring Phys: [id number] DEVON SHAFER IMPRESSIONS  1. Left ventricular ejection fraction, by estimation, is 70 to 75%. The left ventricle has hyperdynamic function. The left ventricle  has no regional wall motion abnormalities. Left ventricular diastolic parameters are consistent with Grade I diastolic dysfunction (impaired relaxation).  2. Right ventricular systolic function is normal. The right ventricular size is normal. Tricuspid regurgitation signal is inadequate for assessing PA pressure.  3. S/p mitral valve repair. No evidence of mitral valve regurgitation. No evidence of mitral stenosis.  4. The aortic valve is tricuspid. Aortic valve regurgitation is not visualized. No aortic stenosis is present. Aortic valve mean gradient measures 8.0 mmHg. Aortic valve Vmax measures 1.93 m/s.  5. Aortic dilatation noted. There is mild dilatation of the ascending aorta, measuring 41 mm.  6. The inferior vena cava is normal in size with greater than 50% respiratory variability, suggesting right atrial pressure of 3 mmHg.  7. Suboptimal images for bubble study, inconclusive. Patient is s/p PFO closure in 12/2022.  8. Increased flow velocities may be secondary to anemia, thyrotoxicosis, hyperdynamic or high flow state. FINDINGS  Left Ventricle: Left ventricular ejection fraction, by estimation, is 70 to 75%. The left ventricle has hyperdynamic function. The left ventricle has no regional wall motion abnormalities. Strain was performed and the global longitudinal strain is indeterminate. The left ventricular internal cavity size was normal in size. There is no left ventricular hypertrophy. Left ventricular diastolic parameters are consistent with Grade I  diastolic dysfunction (impaired relaxation). Right Ventricle: The right ventricular size is normal. No increase in right ventricular wall thickness. Right ventricular systolic function is normal. Tricuspid regurgitation signal is inadequate for assessing PA pressure. Left Atrium: Left atrial size was normal in size. Right Atrium: Right atrial size was normal in size. Pericardium: There is no evidence of pericardial effusion. Mitral Valve: The mitral valve is grossly normal. No evidence of mitral valve regurgitation. No evidence of mitral valve stenosis. Tricuspid Valve: The tricuspid valve is normal in structure. Tricuspid valve regurgitation is mild . No evidence of tricuspid stenosis. Aortic Valve: The aortic valve is tricuspid. Aortic valve regurgitation is not visualized. No aortic stenosis is present. Aortic valve mean gradient measures 8.0 mmHg. Aortic valve peak gradient measures 14.9 mmHg. Aortic valve area, by VTI measures 3.41  cm. Pulmonic Valve: The pulmonic valve was grossly normal. Pulmonic valve regurgitation is trivial. No evidence of pulmonic stenosis. Aorta: The aortic root is normal in size and structure and aortic dilatation noted. There is mild dilatation of the ascending aorta, measuring 41 mm. Venous: The inferior vena cava is normal in size with greater than 50% respiratory variability, suggesting right atrial pressure of 3 mmHg. IAS/Shunts: No atrial level shunt detected by color flow Doppler. Agitated saline contrast was given intravenously to evaluate for intracardiac shunting. Suboptimal images for bubble study, inconclusive. Additional Comments: 3D was performed not requiring image post processing on an independent workstation and was indeterminate.  LEFT VENTRICLE PLAX 2D LVIDd:         4.90 cm   Diastology LVIDs:         3.40 cm   LV e' medial:    8.81 cm/s LV PW:         1.00 cm   LV E/e' medial:  15.7 LV IVS:        1.10 cm   LV e' lateral:   13.10 cm/s LVOT diam:     2.00 cm   LV  E/e' lateral: 10.5 LV SV:         101 LV SV Index:   52 LVOT Area:     3.14 cm  RIGHT VENTRICLE  IVC RV S prime:     14.00 cm/s  IVC diam: 1.30 cm TAPSE (M-mode): 1.9 cm LEFT ATRIUM             Index        RIGHT ATRIUM           Index LA diam:        3.70 cm 1.92 cm/m   RA Area:     20.50 cm LA Vol (A2C):   48.6 ml 25.17 ml/m  RA Volume:   55.20 ml  28.59 ml/m LA Vol (A4C):   45.5 ml 23.57 ml/m LA Biplane Vol: 51.4 ml 26.62 ml/m  AORTIC VALVE AV Area (Vmax):    3.02 cm AV Area (Vmean):   3.13 cm AV Area (VTI):     3.41 cm AV Vmax:           193.00 cm/s AV Vmean:          129.000 cm/s AV VTI:            0.296 m AV Peak Grad:      14.9 mmHg AV Mean Grad:      8.0 mmHg LVOT Vmax:         185.50 cm/s LVOT Vmean:        128.500 cm/s LVOT VTI:          0.321 m LVOT/AV VTI ratio: 1.09  AORTA Ao Root diam: 3.00 cm Ao Asc diam:  4.10 cm MITRAL VALVE MV Area (PHT): 3.37 cm     SHUNTS MV Decel Time: 225 msec     Systemic VTI:  0.32 m MV E velocity: 138.00 cm/s  Systemic Diam: 2.00 cm MV A velocity: 157.00 cm/s MV E/A ratio:  0.88 Vishnu Priya Mallipeddi Electronically signed by Diannah Late Mallipeddi Signature Date/Time: 09/07/2023/11:40:39 AM    Final    CT ANGIO HEAD NECK W WO CM Result Date: 09/06/2023 CLINICAL DATA:  Concern for stroke. EXAM: CT ANGIOGRAPHY HEAD AND NECK WITH AND WITHOUT CONTRAST TECHNIQUE: Multidetector CT imaging of the head and neck was performed using the standard protocol during bolus administration of intravenous contrast. Multiplanar CT image reconstructions and MIPs were obtained to evaluate the vascular anatomy. Carotid stenosis measurements (when applicable) are obtained utilizing NASCET criteria, using the distal internal carotid diameter as the denominator. RADIATION DOSE REDUCTION: This exam was performed according to the departmental dose-optimization program which includes automated exposure control, adjustment of the mA and/or kV according to patient size and/or use  of iterative reconstruction technique. CONTRAST:  75mL OMNIPAQUE  IOHEXOL  350 MG/ML SOLN COMPARISON:  CT head and MRI head 09/04/2023. FINDINGS: CT HEAD FINDINGS Brain: No acute intracranial hemorrhage. No CT evidence of acute infarct. Nonspecific hypoattenuation in the periventricular and subcortical white matter favored to reflect chronic microvascular ischemic changes. No edema, mass effect, or midline shift. The basilar cisterns are patent. 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 Ventricles: Prominence of the ventricles suggesting underlying parenchymal volume loss. Vascular: No hyperdense vessel or unexpected calcification. Skull: No acute or aggressive finding. Orbits: Bilateral lens replacement.  Scleral buckle on the left. Sinuses: The visualized paranasal sinuses are clear. Other: Mastoid air cells are clear. CTA NECK FINDINGS Aortic arch: Standard configuration of the aortic arch. Imaged portion shows no evidence of aneurysm or dissection. No significant stenosis of the major arch vessel origins. Pulmonary arteries: As permitted by contrast timing, there are no filling defects in the visualized pulmonary arteries. Subclavian arteries:  The subclavian arteries are patent bilaterally. Right carotid system: No evidence of dissection, stenosis (50% or greater), or occlusion. Minimal atherosclerosis at the carotid bifurcation. Tortuosity and ectasia of the distal cervical ICA. Left carotid system: No evidence of dissection, stenosis (50% or greater), or occlusion. Tortuosity and ectasia of the distal cervical ICA. Vertebral arteries: Codominant. No evidence of dissection, stenosis (50% or greater), or occlusion. Skeleton: No acute findings. Degenerative changes in the cervical spine. Sequelae of median sternotomy. Other neck: The visualized airway is patent. No cervical lymphadenopathy. Upper chest: Visualized lung apices are clear. Review of the MIP  images confirms the above findings CTA HEAD FINDINGS ANTERIOR CIRCULATION: The intracranial ICAs are patent bilaterally. No significant stenosis, proximal occlusion, aneurysm, or vascular malformation. MCAs: The middle cerebral arteries are patent bilaterally. ACAs: The anterior cerebral arteries are patent bilaterally. POSTERIOR CIRCULATION: No significant stenosis, proximal occlusion, aneurysm, or vascular malformation. PCAs: The posterior cerebral arteries are patent bilaterally. Pcomm: The posterior communicating arteries are visualized bilaterally. SCAs: The superior cerebellar arteries are patent bilaterally. Basilar artery: Patent AICAs: Patent PICAs: Patent Vertebral arteries: The intracranial vertebral arteries are patent. Venous sinuses: As permitted by contrast timing, patent. Anatomic variants: None Review of the MIP images confirms the above findings IMPRESSION: No large vessel occlusion. No high-grade stenosis, aneurysm, or dissection of the arteries in the head and neck. Ectasia and tortuosity of the distal cervical ICAs which may be related to hypertension. No CT evidence of acute intracranial abnormality. Electronically Signed   By: Donnice Mania M.D.   On: 09/06/2023 18:27   EEG adult Result Date: 09/06/2023 Shelton Arlin KIDD, MD     09/06/2023 12:26 PM Patient Name: James Ibarra MRN: [id number] Epilepsy Attending: Arlin KIDD Shelton Referring Physician/Provider: Rosemarie Eather RAMAN, MD Date: 09/06/2023 Duration: 25.38 mins Patient history: 73yo M with syncope. EEG to evaluate for seizure. Level of alertness: Awake, asleep AEDs during EEG study: None Technical aspects: This EEG study was done with scalp electrodes positioned according to the 10-20 International system of electrode placement. Electrical activity was reviewed with band pass filter of 1-70Hz , sensitivity of 7 uV/mm, display speed of 43mm/sec with a 60Hz  notched filter applied as appropriate. EEG data were recorded continuously and digitally  stored.  Video monitoring was available and reviewed as appropriate. Description: The posterior dominant rhythm consists of 8 Hz activity of moderate voltage (25-35 uV) seen predominantly in posterior head regions, symmetric and reactive to eye opening and eye closing. Sleep was characterized by vertex waves, sleep spindles (12 to 14 Hz), maximal frontocentral region. Hyperventilation and photic stimulation were not performed.   IMPRESSION: This study is within normal limits. No seizures or epileptiform discharges were seen throughout the recording. A normal interictal EEG does not exclude the diagnosis of epilepsy. Arlin KIDD Shelton   DG Pelvis Portable Result Date: 09/05/2023 CLINICAL DATA:  Status post left hip replacement. EXAM: PORTABLE PELVIS 1-2 VIEWS COMPARISON:  Preoperative imaging. FINDINGS: Left hip arthroplasty in expected alignment. No periprosthetic lucency. There is a small fracture arising from the greater trochanter that was not definitively seen on preoperative imaging. Recent postsurgical change includes air and edema in the soft tissues. IMPRESSION: 1. Left hip arthroplasty in expected alignment. 2. Small fracture arising from the greater trochanter, not definitively seen on preoperative imaging. This does not abut the hardware. Electronically Signed   By: Andrea Gasman M.D.   On: 09/05/2023 22:09   VAS US  CAROTID Result Date: 09/05/2023 Carotid Arterial Duplex Study Patient Name:  Kade D Beougher  Date of Exam:   09/05/2023 Medical Rec #: [id number]        Accession #:    [id number] Date of Birth: Oct 06, 1950        Patient Gender: M Patient Age:   48 years Exam Location:  [facility] Procedure:      VAS US  CAROTID Referring Phys: EKTA PATEL --------------------------------------------------------------------------------  Indications:       Syncope and Weakness. Risk Factors:      Hypertension, hyperlipidemia, coronary artery disease. Other Factors:     12/08/22 - MV repair. Comparison  Study:  12/06/22 - WNL Performing Technologist: Ricka Sturdivant-Jones RDMS, RVT  Examination Guidelines: A complete evaluation includes B-mode imaging, spectral Doppler, color Doppler, and power Doppler as needed of all accessible portions of each vessel. Bilateral testing is considered an integral part of a complete examination. Limited examinations for reoccurring indications may be performed as noted.  Right Carotid Findings: +----------+--------+--------+--------+------------------+------------------+           PSV cm/sEDV cm/sStenosisPlaque DescriptionComments           +----------+--------+--------+--------+------------------+------------------+ CCA Prox  106     13                                                   +----------+--------+--------+--------+------------------+------------------+ CCA Distal78      14                                intimal thickening +----------+--------+--------+--------+------------------+------------------+ ICA Prox  46      9                                 intimal thickening +----------+--------+--------+--------+------------------+------------------+ ICA Distal70      16                                                   +----------+--------+--------+--------+------------------+------------------+ ECA       99      14                                                   +----------+--------+--------+--------+------------------+------------------+ +----------+--------+-------+----------------+-------------------+           PSV cm/sEDV cmsDescribe        Arm Pressure (mmHG) +----------+--------+-------+----------------+-------------------+ [id number]             Multiphasic, WNL                    +----------+--------+-------+----------------+-------------------+ +---------+--------+--+--------+--+---------+ VertebralPSV cm/s50EDV cm/s12Antegrade +---------+--------+--+--------+--+---------+  Left Carotid Findings:  +----------+--------+--------+--------+------------------+------------------+           PSV cm/sEDV cm/sStenosisPlaque DescriptionComments           +----------+--------+--------+--------+------------------+------------------+ CCA Prox  100     16                                                   +----------+--------+--------+--------+------------------+------------------+  CCA Distal64      14                                intimal thickening +----------+--------+--------+--------+------------------+------------------+ ICA Prox  46      13                                intimal thickening +----------+--------+--------+--------+------------------+------------------+ ICA Mid   72      23                                                   +----------+--------+--------+--------+------------------+------------------+ ICA Distal113     26                                tortuous           +----------+--------+--------+--------+------------------+------------------+ ECA       59      11                                                   +----------+--------+--------+--------+------------------+------------------+ +----------+--------+--------+----------------+-------------------+           PSV cm/sEDV cm/sDescribe        Arm Pressure (mmHG) +----------+--------+--------+----------------+-------------------+ [id number]             Multiphasic, WNL                    +----------+--------+--------+----------------+-------------------+ +---------+--------+--+--------+--+---------+ VertebralPSV cm/s41EDV cm/s11Antegrade +---------+--------+--+--------+--+---------+   Summary: Right Carotid: The extracranial vessels were near-normal with only minimal wall                thickening or plaque. Left Carotid: The extracranial vessels were near-normal with only minimal wall               thickening or plaque. Vertebrals:  Bilateral vertebral arteries demonstrate  antegrade flow. Subclavians: Normal flow hemodynamics were seen in bilateral subclavian              arteries. *See table(s) above for measurements and observations.  Electronically signed by Gaile New MD on 09/05/2023 at 9:55:49 PM.    Final    DG HIP UNILAT WITH PELVIS 1V LEFT Result Date: 09/05/2023 CLINICAL DATA:  Elective surgery EXAM: DG HIP (WITH OR WITHOUT PELVIS) 1V*L* COMPARISON:  Preoperative imaging FINDINGS: Seven fluoroscopic spot views of the pelvis and left hip obtained in the operating room. Sequential images during hip arthroplasty. Fluoroscopy time 25 seconds. Dose 2.6 mGy. IMPRESSION: Intraoperative fluoroscopy during left hip arthroplasty. Electronically Signed   By: Andrea Gasman M.D.   On: 09/05/2023 15:39   DG C-Arm 1-60 Min-No Report Result Date: 09/05/2023 Fluoroscopy was utilized by the requesting physician.  No radiographic interpretation.   DG C-Arm 1-60 Min-No Report Result Date: 09/05/2023 Fluoroscopy was utilized by the requesting physician.  No radiographic interpretation.   MR BRAIN WO CONTRAST Result Date: 09/04/2023 CLINICAL DATA:  Syncope, presyncope. EXAM: MRI HEAD WITHOUT CONTRAST TECHNIQUE: Multiplanar, multiecho pulse sequences of the brain and surrounding structures were obtained without intravenous contrast. COMPARISON:  Same day CT head. FINDINGS: Brain: Punctate focus of restricted diffusion in the left hippocampus (series 5, image 72). No acute hemorrhage, hydrocephalus, extra-axial collection or mass lesion. Cerebral atrophy. 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 Vascular: Major arterial flow voids are maintained skull base. Skull and upper cervical spine: Normal marrow signal. Sinuses/Orbits: Clear sinuses.  Neck overall findings. IMPRESSION: Punctate focus of restricted diffusion in the left hippocampus, compatible with transient global amnesia versus acute infarct.  Electronically Signed   By: Gilmore GORMAN Molt M.D.   On: 09/04/2023 23:43   DG Ankle 2 Views Right Result Date: 09/04/2023 CLINICAL DATA:  Pain after fall at home, initial encounter. EXAM: RIGHT ANKLE - 2 VIEW COMPARISON:  None Available. FINDINGS: Lateral view is limited by positioning. Allowing for this, no evidence of acute fracture. No dislocation. The ankle mortise is preserved on AP view. Cannot assess for joint effusion. No focal soft tissue abnormalities. IMPRESSION: No acute fracture or dislocation of the right ankle. Electronically Signed   By: Andrea Gasman M.D.   On: 09/04/2023 20:35   CT HEAD WO CONTRAST Result Date: 09/04/2023 CLINICAL DATA:  Provided history: Head trauma, moderate/severe. Polytrauma, blunt. EXAM: CT HEAD WITHOUT CONTRAST CT CERVICAL SPINE WITHOUT CONTRAST TECHNIQUE: Multidetector CT imaging of the head and cervical spine was performed following the standard protocol without intravenous contrast. Multiplanar CT image reconstructions of the cervical spine were also generated. RADIATION DOSE REDUCTION: This exam was performed according to the departmental dose-optimization program which includes automated exposure control, adjustment of the mA and/or kV according to patient size and/or use of iterative reconstruction technique. COMPARISON:  None. FINDINGS: CT HEAD FINDINGS Brain: Generalized cerebral atrophy. Prominence of the ventricles and sulci, which appears commensurate. Patchy and ill-defined hypoattenuation within the cerebral white matter, nonspecific but compatible with mild chronic small vessel ischemic disease. Mega cisterna magna (anatomic variant). There is no acute intracranial hemorrhage. No demarcated cortical infarct. No extra-axial fluid collection. No evidence of an intracranial mass. No midline shift.  a Vascular: No hyperdense vessel.  Atherosclerotic calcifications. Skull: No calvarial fracture or aggressive osseous lesion. Sinuses/Orbits: No mass or acute  finding within the imaged orbits. Left scleral buckle. Prior bilateral ocular lens replacement. No significant paranasal sinus disease. CT CERVICAL SPINE FINDINGS Alignment: Dextrocurvature of the cervical spine. Levocurvature of the visible upper thoracic spine. 2 mm C7-T1 grade 1 anterolisthesis. Skull base and vertebrae: The basion-dental and atlanto-dental intervals are maintained.No evidence of acute fracture to the cervical spine. Soft tissues and spinal canal: No prevertebral fluid or swelling. No visible canal hematoma. Disc levels: Cervical spondylosis with multilevel disc space narrowing, disc bulges/central disc protrusions, endplate spurring and uncovertebral hypertrophy. Facet arthropathy on the left at C7-T1. No appreciable high-grade spinal canal stenosis. Multilevel bony neural foraminal narrowing, greatest on the left at C3-C4, C4-C5 and C5-C6. Degenerative changes also present at the C1-C2 articulation. Upper chest: No consolidation within the imaged lung apices. No visible pneumothorax. IMPRESSION: CT head: 1. No evidence of an acute intracranial abnormality. 2. Parenchymal atrophy and chronic small vessel ischemic disease. CT cervical spine: 1. No evidence of an acute cervical spine fracture. 2. 2 mm C7-T1 grade 1 anterolisthesis. 3. Dextrocurvature of the cervical spine. 4. Cervical spondylosis as described. Electronically Signed   By: Rockey Childs D.O.   On: 09/04/2023 17:01   CT CERVICAL SPINE WO CONTRAST Result Date: 09/04/2023 CLINICAL DATA:  Provided history: Head trauma, moderate/severe. Polytrauma, blunt. EXAM: CT  HEAD WITHOUT CONTRAST CT CERVICAL SPINE WITHOUT CONTRAST TECHNIQUE: Multidetector CT imaging of the head and cervical spine was performed following the standard protocol without intravenous contrast. Multiplanar CT image reconstructions of the cervical spine were also generated. RADIATION DOSE REDUCTION: This exam was performed according to the departmental dose-optimization  program which includes automated exposure control, adjustment of the mA and/or kV according to patient size and/or use of iterative reconstruction technique. COMPARISON:  None. FINDINGS: CT HEAD FINDINGS Brain: Generalized cerebral atrophy. Prominence of the ventricles and sulci, which appears commensurate. Patchy and ill-defined hypoattenuation within the cerebral white matter, nonspecific but compatible with mild chronic small vessel ischemic disease. Mega cisterna magna (anatomic variant). There is no acute intracranial hemorrhage. No demarcated cortical infarct. No extra-axial fluid collection. No evidence of an intracranial mass. No midline shift.  a Vascular: No hyperdense vessel.  Atherosclerotic calcifications. Skull: No calvarial fracture or aggressive osseous lesion. Sinuses/Orbits: No mass or acute finding within the imaged orbits. Left scleral buckle. Prior bilateral ocular lens replacement. No significant paranasal sinus disease. CT CERVICAL SPINE FINDINGS Alignment: Dextrocurvature of the cervical spine. Levocurvature of the visible upper thoracic spine. 2 mm C7-T1 grade 1 anterolisthesis. Skull base and vertebrae: The basion-dental and atlanto-dental intervals are maintained.No evidence of acute fracture to the cervical spine. Soft tissues and spinal canal: No prevertebral fluid or swelling. No visible canal hematoma. Disc levels: Cervical spondylosis with multilevel disc space narrowing, disc bulges/central disc protrusions, endplate spurring and uncovertebral hypertrophy. Facet arthropathy on the left at C7-T1. No appreciable high-grade spinal canal stenosis. Multilevel bony neural foraminal narrowing, greatest on the left at C3-C4, C4-C5 and C5-C6. Degenerative changes also present at the C1-C2 articulation. Upper chest: No consolidation within the imaged lung apices. No visible pneumothorax. IMPRESSION: CT head: 1. No evidence of an acute intracranial abnormality. 2. Parenchymal atrophy and  chronic small vessel ischemic disease. CT cervical spine: 1. No evidence of an acute cervical spine fracture. 2. 2 mm C7-T1 grade 1 anterolisthesis. 3. Dextrocurvature of the cervical spine. 4. Cervical spondylosis as described. Electronically Signed   By: Rockey Childs D.O.   On: 09/04/2023 17:01   CT CHEST ABDOMEN PELVIS W CONTRAST Result Date: 09/04/2023 CLINICAL DATA:  Found down, fell, hip injury EXAM: CT CHEST, ABDOMEN, AND PELVIS WITH CONTRAST TECHNIQUE: Multidetector CT imaging of the chest, abdomen and pelvis was performed following the standard protocol during bolus administration of intravenous contrast. RADIATION DOSE REDUCTION: This exam was performed according to the departmental dose-optimization program which includes automated exposure control, adjustment of the mA and/or kV according to patient size and/or use of iterative reconstruction technique. CONTRAST:  75mL OMNIPAQUE  IOHEXOL  350 MG/ML SOLN COMPARISON:  09/04/2023, 05/03/2023 FINDINGS: CT CHEST FINDINGS Cardiovascular: Postsurgical changes from median sternotomy and mitral valve replacement. Surgical clip across the left atrial appendage. Heart is otherwise unremarkable without pericardial effusion. No evidence of vascular injury. No evidence of thoracic aortic aneurysm or dissection. Atherosclerosis of the aortic arch. Mediastinum/Nodes: No enlarged mediastinal, hilar, or axillary lymph nodes. Thyroid  gland, trachea, and esophagus demonstrate no significant findings. Lungs/Pleura: No acute airspace disease, effusion, or pneumothorax. Minimal hypoventilatory changes or subpleural scarring within the dependent lower lobes. Central airways are patent. Musculoskeletal: Prior median sternotomy. No acute or destructive bony abnormalities. Reconstructed images demonstrate no additional findings. CT ABDOMEN PELVIS FINDINGS Hepatobiliary: No hepatic injury or perihepatic hematoma. Gallbladder is unremarkable. Pancreas: Unremarkable. No pancreatic  ductal dilatation or surrounding inflammatory changes. Spleen: No splenic injury or perisplenic hematoma. Adrenals/Urinary Tract: No adrenal  hemorrhage or renal injury identified. Bladder is unremarkable. Stomach/Bowel: No bowel obstruction or ileus. Normal appendix right lower quadrant. Diverticulosis of the descending and sigmoid colon without diverticulitis. No bowel wall thickening or inflammatory change. Vascular/Lymphatic: Aortic atherosclerosis. No enlarged abdominal or pelvic lymph nodes. Reproductive: Prostate is unremarkable. Other: No free fluid or free intraperitoneal gas. No abdominal wall hernia. Musculoskeletal: Comminuted impacted subcapital left femoral neck fracture is identified, with ventral and varus angulation at the fracture site. No dislocation. No other acute displaced fractures. Moderate soft tissue swelling surrounding the left hip fracture. Reconstructed images demonstrate no additional findings. IMPRESSION: 1. Comminuted subcapital left femoral neck fracture. 2. Otherwise no acute intrathoracic, intra-abdominal, or intrapelvic trauma. 3.  Aortic Atherosclerosis (ICD10-I70.0). 4. Distal colonic diverticulosis without diverticulitis. Electronically Signed   By: Ozell Daring M.D.   On: 09/04/2023 16:49   DG Pelvis Portable Result Date: 09/04/2023 CLINICAL DATA:  Trauma fall EXAM: PORTABLE PELVIS 1-2 VIEWS COMPARISON:  None Available. FINDINGS: SI joints are non widened. Pubic symphysis and rami appear intact. Acute displaced left femoral neck fracture. No femoral head dislocation IMPRESSION: Acute displaced left femoral neck fracture. Electronically Signed   By: Luke Bun M.D.   On: 09/04/2023 15:58   DG Chest Port 1 View Result Date: 09/04/2023 CLINICAL DATA:  Trauma fall EXAM: PORTABLE CHEST 1 VIEW COMPARISON:  12/26/2022 FINDINGS: Post sternotomy changes with valve prosthesis and atrial appendage clip. Hyperinflated lungs. No focal opacity, pleural effusion, or pneumothorax.  IMPRESSION: No active disease. Hyperinflated lungs. Electronically Signed   By: Luke Bun M.D.   On: 09/04/2023 15:57   (Echo, Carotid, EGD, Colonoscopy, ERCP)    Subjective: Pain is controlled no complaints had a bowel movement.  Discharge Exam: Vitals:   09/10/23 0442 09/10/23 0747  BP: (!) 158/98 (!) 141/82  Pulse: 80 (!) 107  Resp: 18 17  Temp: 98.4 F (36.9 C) 98 F (36.7 C)  SpO2: 100% 100%   Vitals:   09/09/23 1517 09/09/23 1934 09/10/23 0442 09/10/23 0747  BP: (!) 153/97 (!) 160/91 (!) 158/98 (!) 141/82  Pulse: 78 80 80 (!) 107  Resp: 18 18 18 17   Temp: 98.2 F (36.8 C) 98.8 F (37.1 C) 98.4 F (36.9 C) 98 F (36.7 C)  TempSrc:  Oral  Oral  SpO2: 100% 98% 100% 100%  Weight:      Height:        General: Pt is alert, awake, not in acute distress Cardiovascular: RRR, S1/S2 +, no rubs, no gallops Respiratory: CTA bilaterally, no wheezing, no rhonchi Abdominal: Soft, NT, ND, bowel sounds + Extremities: no edema, no cyanosis    The results of significant diagnostics from this hospitalization (including imaging, microbiology, ancillary and laboratory) are listed below for reference.     Microbiology: Recent Results (from the past 240 hours)  Surgical PCR screen     Status: None   Collection Time: 09/05/23 12:13 AM   Specimen: Nasal Mucosa; Nasal Swab  Result Value Ref Range Status   MRSA, PCR NEGATIVE NEGATIVE Final   Staphylococcus aureus NEGATIVE NEGATIVE Final    Comment: (NOTE) The Xpert SA Assay (FDA approved for NASAL specimens in patients 64 years of age and older), is one component of a comprehensive surveillance program. It is not intended to diagnose infection nor to guide or monitor treatment. Performed at [facility], [address], [place], [place] [address]      Labs: BNP (last 3 results) No results for input(s): BNP in the  last 8760 hours. Basic Metabolic Panel: Recent Labs  Lab 09/04/23 1550 09/04/23 1604  09/05/23 0535 09/05/23 1739 09/06/23 0554 09/07/23 0548 09/08/23 0337 09/08/23 0915 09/09/23 1353  NA 138   < > 137  --  138 140 138  --  135  K 3.2*   < > 3.0*  --  3.0* 3.4* 3.2*  --  3.9  CL 101   < > 98  --  101 103 101  --  102  CO2 24  --  29  --  25 25 26   --  22  GLUCOSE 127*   < > 117*  --  108* 88 101*  --  140*  BUN 16   < > 13  --  13 12 7*  --  14  CREATININE 0.84   < > 0.73 0.76 0.74 0.68 0.63  --  0.59*  CALCIUM  8.9  --  8.5*  --  8.5* 8.7* 8.8*  --  9.3  MG 2.0  --   --   --   --   --   --  1.9  --    < > = values in this interval not displayed.   Liver Function Tests: Recent Labs  Lab 09/04/23 1550  AST 46*  ALT 26  ALKPHOS 62  BILITOT 2.3*  PROT 6.6  ALBUMIN  3.8   No results for input(s): LIPASE, AMYLASE in the last 168 hours. No results for input(s): AMMONIA in the last 168 hours. CBC: Recent Labs  Lab 09/05/23 0535 09/05/23 1739 09/06/23 0554 09/07/23 0548 09/08/23 0337  WBC 10.5 12.1* 12.8* 9.6 9.0  HGB 12.6* 12.3* 12.2* 11.6* 10.9*  HCT 36.4* 35.9* 36.0* 34.4* 31.4*  MCV 88.6 87.8 89.1 91.2 89.7  PLT 156 138* 146* 145* 174   Cardiac Enzymes: Recent Labs  Lab 09/04/23 1550 09/06/23 1156  CKTOTAL 780* 639*   BNP: Invalid input(s): POCBNP CBG: No results for input(s): GLUCAP in the last 168 hours. D-Dimer No results for input(s): DDIMER in the last 72 hours. Hgb A1c No results for input(s): HGBA1C in the last 72 hours. Lipid Profile No results for input(s): CHOL, HDL, LDLCALC, TRIG, CHOLHDL, LDLDIRECT in the last 72 hours. Thyroid  function studies No results for input(s): TSH, T4TOTAL, T3FREE, THYROIDAB in the last 72 hours.  Invalid input(s): FREET3 Anemia work up No results for input(s): VITAMINB12, FOLATE, FERRITIN, TIBC, IRON , RETICCTPCT in the last 72 hours. Urinalysis    Component Value Date/Time   COLORURINE YELLOW 09/04/2023 2333   APPEARANCEUR CLEAR 09/04/2023 2333    LABSPEC 1.039 (H) 09/04/2023 2333   PHURINE 6.0 09/04/2023 2333   GLUCOSEU NEGATIVE 09/04/2023 2333   HGBUR SMALL (A) 09/04/2023 2333   BILIRUBINUR NEGATIVE 09/04/2023 2333   KETONESUR NEGATIVE 09/04/2023 2333   PROTEINUR 100 (A) 09/04/2023 2333   NITRITE NEGATIVE 09/04/2023 2333   LEUKOCYTESUR NEGATIVE 09/04/2023 2333   Sepsis Labs Recent Labs  Lab 09/05/23 1739 09/06/23 0554 09/07/23 0548 09/08/23 0337  WBC 12.1* 12.8* 9.6 9.0   Microbiology Recent Results (from the past 240 hours)  Surgical PCR screen     Status: None   Collection Time: 09/05/23 12:13 AM   Specimen: Nasal Mucosa; Nasal Swab  Result Value Ref Range Status   MRSA, PCR NEGATIVE NEGATIVE Final   Staphylococcus aureus NEGATIVE NEGATIVE Final    Comment: (NOTE) The Xpert SA Assay (FDA approved for NASAL specimens in patients 60 years of age and older), is one component of a comprehensive surveillance  program. It is not intended to diagnose infection nor to guide or monitor treatment. Performed at [facility], [address], [place], [place] [address]      Time coordinating discharge: Over 30 minutes  SIGNED:   Erle Odell Castor, MD  [facility] 09/10/2023, 9:15 AM Pager   If 7PM-7AM, please contact night-coverage [email or web address] Password [email or web address]

## 2023-09-10 NOTE — Progress Notes (Signed)
 Called report to [facility], room 114, [phone]   Spoke w/Shar A. RN. Report given and answered all questions.   PTAR has been called. Removed PIV's and educations given to pt.

## 2023-09-10 NOTE — Plan of Care (Signed)
  Problem: Pain Managment: Goal: General experience of comfort will improve and/or be controlled Outcome: Progressing   Problem: Safety: Goal: Ability to remain free from injury will improve Outcome: Progressing   Problem: Skin Integrity: Goal: Risk for impaired skin integrity will decrease Outcome: Progressing

## 2023-09-10 NOTE — Progress Notes (Signed)
 Occupational Therapy Treatment Patient Details Name: James Ibarra MRN: [id number] DOB: 10/30/1950 Today's Date: 09/10/2023   History of present illness Pt is a 73 y.o. male admitted 7/1 after being found down at home. 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 Xray revealed L hip fx. He underwent L THA 7/2. PMH:  MR s/p repair (Oct 2024), HTN, HLD, CAD, a fib, ETOH abuse   OT comments  Pt making good progress with functional goals with noted improved mentation since last session. Pt seated in chair upon OT arrival after working with PT earlier. Pt participated in standing at [facility] for SPT to [facility], toileting tasks, SPT to sit EOB for UB ADLs and grooming/hygiene tasks the transferred back to chair min A      If plan is discharge home, recommend the following:  A lot of help with bathing/dressing/bathroom;A little help with walking and/or transfers;Help with stairs or ramp for entrance;Assist for transportation;Direct supervision/assist for medications management   Equipment Recommendations  Other (comment) (defer to SNF)    Recommendations for Other Services      Precautions / Restrictions Precautions Precautions: Fall Recall of Precautions/Restrictions: Impaired Restrictions Weight Bearing Restrictions Per Provider Order: Yes LLE Weight Bearing Per Provider Order: Weight bearing as tolerated Other Position/Activity Restrictions: no ROM restrictions       Mobility Bed Mobility               General bed mobility comments: pt in recliner upon OT arrival    Transfers Overall transfer level: Needs assistance Equipment used: Rolling walker (2 wheels) Transfers: Sit to/from Stand Sit to Stand: Min assist           General transfer comment: Cues for hand placement     Balance Overall balance assessment: Needs assistance Sitting-balance support: Single extremity supported,  Bilateral upper extremity supported, Feet supported Sitting balance-Leahy Scale: Good     Standing balance support: Bilateral upper extremity supported, During functional activity, Reliant on assistive device for balance Standing balance-Leahy Scale: Poor                             ADL either performed or assessed with clinical judgement   ADL Overall ADL's : Needs assistance/impaired     Grooming: Wash/dry hands;Wash/dry face;Contact guard assist;Sitting           Upper Body Dressing : Minimal assistance;Sitting;Cueing for sequencing   Lower Body Dressing: Total assistance;Sitting/lateral leans;Sit to/from stand;Cueing for safety;Cueing for sequencing   Toilet Transfer: Minimal assistance;Rolling walker (2 wheels);Stand-pivot;BSC/3in1;Cueing for safety;Cueing for sequencing   Toileting- Clothing Manipulation and Hygiene: Maximal assistance;Sit to/from stand       Functional mobility during ADLs: Minimal assistance;Rolling walker (2 wheels);Cueing for safety;Cueing for sequencing      Extremity/Trunk Assessment Upper Extremity Assessment Upper Extremity Assessment: Right hand dominant;Generalized weakness   Lower Extremity Assessment Lower Extremity Assessment: Defer to PT evaluation   Cervical / Trunk Assessment Cervical / Trunk Assessment: Kyphotic    Vision Baseline Vision/History: 1 Wears glasses Ability to See in Adequate Light: 0 Adequate Patient Visual Report: No change from baseline     Perception     Praxis     Communication Communication Communication:  (much improved this session)   Cognition Arousal: Alert Behavior During Therapy: [facility] for tasks assessed/performed  Following commands: Intact Following commands impaired: Only follows one step commands consistently, Follows one step commands with increased time      Cueing   Cueing Techniques: Verbal cues, Tactile cues, Visual cues   Exercises      Shoulder Instructions       General Comments      Pertinent Vitals/ Pain       Pain Assessment Pain Assessment: Faces Faces Pain Scale: Hurts little more Pain Location: L LE/hip Pain Descriptors / Indicators: Guarding, Grimacing, Discomfort Pain Intervention(s): Monitored during session, Limited activity within patient's tolerance, Repositioned, Ice applied  Home Living                                          Prior Functioning/Environment              Frequency  Min 2X/week        Progress Toward Goals  OT Goals(current goals can now be found in the care plan section)  Progress towards OT goals: Progressing toward goals     Plan      Co-evaluation                 AM-PAC OT 6 Clicks Daily Activity     Outcome Measure   Help from another person eating meals?: None Help from another person taking care of personal grooming?: A Little Help from another person toileting, which includes using toliet, bedpan, or urinal?: A Lot Help from another person bathing (including washing, rinsing, drying)?: A Lot Help from another person to put on and taking off regular upper body clothing?: A Little Help from another person to put on and taking off regular lower body clothing?: Total 6 Click Score: 15    End of Session Equipment Utilized During Treatment: Gait belt;Rolling walker (2 wheels);Other (comment) (BSC)  OT Visit Diagnosis: Other abnormalities of gait and mobility (R26.89);Muscle weakness (generalized) (M62.81);Ataxia, unspecified (R27.0);History of falling (Z91.81);Other symptoms and signs involving cognitive function   Activity Tolerance Patient tolerated treatment well;Patient limited by fatigue   Patient Left with call bell/phone within reach;in chair;with chair alarm set   Nurse Communication Mobility status        Time: 1041-1105 OT Time Calculation (min): 24 min  Charges: OT General Charges $OT Visit:  1 Visit OT Treatments $Self Care/Home Management : 8-22 mins $Therapeutic Activity: 8-22 mins    Jacques Karna Loose 09/10/2023, 2:07 PM

## 2023-09-10 NOTE — Progress Notes (Signed)
 Physical Therapy Treatment Patient Details Name: James Ibarra MRN: [id number] DOB: 08/13/1950 Today's Date: 09/10/2023   History of Present Illness Pt is a 73 y.o. male admitted 7/1 after being found down at home. 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 Xray revealed L hip fx. He underwent L THA 7/2. PMH:  MR s/p repair (Oct 2024), HTN, HLD, CAD, a fib, ETOH abuse    PT Comments  Pt supine in bed on arrival.  He is more alert and communicative.  He continues to improve as evident but following 1 step commands and participating in PT exercises.  Pt to d/c to next level of care this pm for post acute rehab before returning home.      If plan is discharge home, recommend the following: Two people to help with walking and/or transfers;Two people to help with bathing/dressing/bathroom   Can travel by private vehicle     No  Equipment Recommendations  Other (comment) (TBD)    Recommendations for Other Services       Precautions / Restrictions Precautions Precautions: Fall Recall of Precautions/Restrictions: Impaired Restrictions Weight Bearing Restrictions Per Provider Order: Yes LLE Weight Bearing Per Provider Order: Weight bearing as tolerated Other Position/Activity Restrictions: no ROM restrictions     Mobility  Bed Mobility Overal bed mobility: Needs Assistance Bed Mobility: Supine to Sit     Supine to sit: Contact guard     General bed mobility comments: Pt able to move to edge of bed with increased time and effort assisting LLE by self to edge of bed.    Transfers Overall transfer level: Needs assistance Equipment used: Rolling walker (2 wheels) Transfers: Sit to/from Stand Sit to Stand: Min assist           General transfer comment: Cues for hand placement to and from seated surface.  Pt attempted to reach and pull on RW this session but responded well to  corrections to push from seated surface.  Pt qith good eccentric load back to bed.    Ambulation/Gait Ambulation/Gait assistance: Min assist Gait Distance (Feet): 40 Feet Assistive device: Rolling walker (2 wheels) Gait Pattern/deviations: Step-to pattern, Trunk flexed, Shuffle, Decreased weight shift to left       General Gait Details: Pt with improper sequencing but able to advance  gt distance with this method.  Pt able to follow commands to stay inside device.  He fatigues quickly during session.   Stairs             Wheelchair Mobility     Tilt Bed    Modified Rankin (Stroke Patients Only)       Balance Overall balance assessment: Needs assistance   Sitting balance-Leahy Scale: Good       Standing balance-Leahy Scale: Poor Standing balance comment: reliance on B UE support.                            Communication Communication Communication:  (much improved this session)  Cognition Arousal: Alert Behavior During Therapy: WFL for tasks assessed/performed   PT - Cognitive impairments: Difficult to assess                       PT - Cognition Comments: Pt continues to have long pauses where he does not follow commands, this makes it difficult to progress mobility. Following commands: Intact Following commands  impaired: Only follows one step commands consistently, Follows one step commands with increased time    Cueing Cueing Techniques: Verbal cues, Tactile cues, Visual cues  Exercises General Exercises - Lower Extremity Ankle Circles/Pumps: AROM, Both, 10 reps, Supine Quad Sets: AROM, Left, 10 reps, Supine Heel Slides: AAROM, Left, 10 reps, Supine Hip ABduction/ADduction: AAROM, Left, 10 reps, Supine    General Comments        Pertinent Vitals/Pain Pain Assessment Pain Assessment: Faces Faces Pain Scale: Hurts even more Pain Location: LLE Pain Descriptors / Indicators: Moaning, Guarding, Grimacing, Discomfort Pain  Intervention(s): Monitored during session, Repositioned, Ice applied    Home Living                          Prior Function            PT Goals (current goals can now be found in the care plan section) Acute Rehab PT Goals Patient Stated Goal: unable to state Potential to Achieve Goals: Good Progress towards PT goals: Progressing toward goals    Frequency    Min 2X/week      PT Plan      Co-evaluation              AM-PAC PT 6 Clicks Mobility   Outcome Measure  Help needed turning from your back to your side while in a flat bed without using bedrails?: A Lot Help needed moving from lying on your back to sitting on the side of a flat bed without using bedrails?: A Lot Help needed moving to and from a bed to a chair (including a wheelchair)?: A Lot Help needed standing up from a chair using your arms (e.g., wheelchair or bedside chair)?: A Lot Help needed to walk in hospital room?: A Lot Help needed climbing 3-5 steps with a railing? : Total 6 Click Score: 11    End of Session Equipment Utilized During Treatment: Gait belt Activity Tolerance: Patient tolerated treatment well Patient left: in chair;with call bell/phone within reach;with chair alarm set Nurse Communication: Mobility status PT Visit Diagnosis: Other abnormalities of gait and mobility (R26.89);Pain Pain - Right/Left: Left Pain - part of body: Hip     Time: 9053-8983 PT Time Calculation (min) (ACUTE ONLY): 30 min  Charges:    $Gait Training: 8-22 mins $Therapeutic Exercise: 8-22 mins PT General Charges $$ ACUTE PT VISIT: 1 Visit                     James Ibarra , PTA Acute Rehabilitation Services Office [phone]    James Ibarra 09/10/2023, 10:25 AM

## 2023-09-10 NOTE — TOC Transition Note (Signed)
 Transition of Care [facility]) - Discharge Note   Patient Details  Name: James Ibarra MRN: [id number] Date of Birth: 1950-04-09  [facility] of [facility]) CM/SW Contact:  Bridget Cordella Simmonds, LCSW Phone Number: 09/10/2023, 10:16 AM   Clinical Narrative:   Pt discharging to [facility], room 114.  RN call report to [phone].  PTAR called [phone].   0945: CSW confirmed with Nikki/[facility] that they can receive pt today.     Final next level of care: Skilled Nursing Facility Barriers to Discharge: Barriers Resolved   Patient Goals and CMS Choice            Discharge Placement              Patient chooses bed at: [facility] Patient to be transferred to facility by: ptar Name of family member notified: wife [facility] Patient and family notified of of transfer: 09/10/23  Discharge Plan and Services Additional resources added to the After Visit Summary for   In-house Referral: Clinical Social Work   Post Acute Care Choice: Skilled Nursing Facility                               Social Drivers of Health (SDOH) Interventions SDOH Screenings   Food Insecurity: No Food Insecurity (09/06/2023)  Housing: Low Risk  (09/06/2023)  Transportation Needs: Patient Unable To Answer (09/06/2023)  Utilities: Not At Risk (09/06/2023)  Social Connections: Unknown (09/06/2023)  Tobacco Use: Low Risk  (09/05/2023)     Readmission Risk Interventions    12/14/2022   11:51 AM  Readmission Risk Prevention Plan  Transportation Screening Complete  PCP or Specialist Appt within 5-7 Days --  Home Care Screening Complete  Medication Review (RN CM) Complete

## 2023-09-11 DIAGNOSIS — R451 Restlessness and agitation: Secondary | ICD-10-CM | POA: Diagnosis not present

## 2023-09-11 DIAGNOSIS — I2510 Atherosclerotic heart disease of native coronary artery without angina pectoris: Secondary | ICD-10-CM | POA: Diagnosis not present

## 2023-09-11 DIAGNOSIS — R5381 Other malaise: Secondary | ICD-10-CM | POA: Diagnosis not present

## 2023-09-11 DIAGNOSIS — S72002D Fracture of unspecified part of neck of left femur, subsequent encounter for closed fracture with routine healing: Secondary | ICD-10-CM | POA: Diagnosis not present

## 2023-09-11 DIAGNOSIS — I10 Essential (primary) hypertension: Secondary | ICD-10-CM | POA: Diagnosis not present

## 2023-09-11 DIAGNOSIS — I4891 Unspecified atrial fibrillation: Secondary | ICD-10-CM | POA: Diagnosis not present

## 2023-09-12 DIAGNOSIS — R451 Restlessness and agitation: Secondary | ICD-10-CM | POA: Diagnosis not present

## 2023-09-12 DIAGNOSIS — S72002D Fracture of unspecified part of neck of left femur, subsequent encounter for closed fracture with routine healing: Secondary | ICD-10-CM | POA: Diagnosis not present

## 2023-09-12 DIAGNOSIS — I2510 Atherosclerotic heart disease of native coronary artery without angina pectoris: Secondary | ICD-10-CM | POA: Diagnosis not present

## 2023-09-12 DIAGNOSIS — R5381 Other malaise: Secondary | ICD-10-CM | POA: Diagnosis not present

## 2023-09-12 DIAGNOSIS — I10 Essential (primary) hypertension: Secondary | ICD-10-CM | POA: Diagnosis not present

## 2023-09-12 DIAGNOSIS — I4891 Unspecified atrial fibrillation: Secondary | ICD-10-CM | POA: Diagnosis not present

## 2023-09-13 DIAGNOSIS — R2689 Other abnormalities of gait and mobility: Secondary | ICD-10-CM | POA: Diagnosis not present

## 2023-09-13 DIAGNOSIS — M6281 Muscle weakness (generalized): Secondary | ICD-10-CM | POA: Diagnosis not present

## 2023-09-13 DIAGNOSIS — E441 Mild protein-calorie malnutrition: Secondary | ICD-10-CM | POA: Diagnosis not present

## 2023-09-13 DIAGNOSIS — S72002D Fracture of unspecified part of neck of left femur, subsequent encounter for closed fracture with routine healing: Secondary | ICD-10-CM | POA: Diagnosis not present

## 2023-09-13 DIAGNOSIS — S72001D Fracture of unspecified part of neck of right femur, subsequent encounter for closed fracture with routine healing: Secondary | ICD-10-CM | POA: Diagnosis not present

## 2023-09-14 DIAGNOSIS — I2510 Atherosclerotic heart disease of native coronary artery without angina pectoris: Secondary | ICD-10-CM | POA: Diagnosis not present

## 2023-09-14 DIAGNOSIS — I10 Essential (primary) hypertension: Secondary | ICD-10-CM | POA: Diagnosis not present

## 2023-09-14 DIAGNOSIS — R5381 Other malaise: Secondary | ICD-10-CM | POA: Diagnosis not present

## 2023-09-14 DIAGNOSIS — S72002D Fracture of unspecified part of neck of left femur, subsequent encounter for closed fracture with routine healing: Secondary | ICD-10-CM | POA: Diagnosis not present

## 2023-09-14 DIAGNOSIS — R451 Restlessness and agitation: Secondary | ICD-10-CM | POA: Diagnosis not present

## 2023-09-14 DIAGNOSIS — I4891 Unspecified atrial fibrillation: Secondary | ICD-10-CM | POA: Diagnosis not present

## 2023-09-17 DIAGNOSIS — T8189XA Other complications of procedures, not elsewhere classified, initial encounter: Secondary | ICD-10-CM | POA: Diagnosis not present

## 2023-09-17 DIAGNOSIS — I2510 Atherosclerotic heart disease of native coronary artery without angina pectoris: Secondary | ICD-10-CM | POA: Diagnosis not present

## 2023-09-17 DIAGNOSIS — R5381 Other malaise: Secondary | ICD-10-CM | POA: Diagnosis not present

## 2023-09-17 DIAGNOSIS — R451 Restlessness and agitation: Secondary | ICD-10-CM | POA: Diagnosis not present

## 2023-09-17 DIAGNOSIS — R2689 Other abnormalities of gait and mobility: Secondary | ICD-10-CM | POA: Diagnosis not present

## 2023-09-17 DIAGNOSIS — I4891 Unspecified atrial fibrillation: Secondary | ICD-10-CM | POA: Diagnosis not present

## 2023-09-17 DIAGNOSIS — S72002D Fracture of unspecified part of neck of left femur, subsequent encounter for closed fracture with routine healing: Secondary | ICD-10-CM | POA: Diagnosis not present

## 2023-09-17 DIAGNOSIS — E441 Mild protein-calorie malnutrition: Secondary | ICD-10-CM | POA: Diagnosis not present

## 2023-09-17 DIAGNOSIS — S72001D Fracture of unspecified part of neck of right femur, subsequent encounter for closed fracture with routine healing: Secondary | ICD-10-CM | POA: Diagnosis not present

## 2023-09-17 DIAGNOSIS — M6281 Muscle weakness (generalized): Secondary | ICD-10-CM | POA: Diagnosis not present

## 2023-09-17 DIAGNOSIS — I10 Essential (primary) hypertension: Secondary | ICD-10-CM | POA: Diagnosis not present

## 2023-09-18 DIAGNOSIS — S72002D Fracture of unspecified part of neck of left femur, subsequent encounter for closed fracture with routine healing: Secondary | ICD-10-CM | POA: Diagnosis not present

## 2023-09-18 DIAGNOSIS — R451 Restlessness and agitation: Secondary | ICD-10-CM | POA: Diagnosis not present

## 2023-09-18 DIAGNOSIS — I2510 Atherosclerotic heart disease of native coronary artery without angina pectoris: Secondary | ICD-10-CM | POA: Diagnosis not present

## 2023-09-18 DIAGNOSIS — I10 Essential (primary) hypertension: Secondary | ICD-10-CM | POA: Diagnosis not present

## 2023-09-18 DIAGNOSIS — I4891 Unspecified atrial fibrillation: Secondary | ICD-10-CM | POA: Diagnosis not present

## 2023-09-18 DIAGNOSIS — R5381 Other malaise: Secondary | ICD-10-CM | POA: Diagnosis not present

## 2023-09-19 ENCOUNTER — Other Ambulatory Visit (INDEPENDENT_AMBULATORY_CARE_PROVIDER_SITE_OTHER)

## 2023-09-19 ENCOUNTER — Ambulatory Visit (INDEPENDENT_AMBULATORY_CARE_PROVIDER_SITE_OTHER): Admitting: Orthopaedic Surgery

## 2023-09-19 DIAGNOSIS — M25552 Pain in left hip: Secondary | ICD-10-CM

## 2023-09-19 DIAGNOSIS — I4891 Unspecified atrial fibrillation: Secondary | ICD-10-CM | POA: Diagnosis not present

## 2023-09-19 DIAGNOSIS — I10 Essential (primary) hypertension: Secondary | ICD-10-CM | POA: Diagnosis not present

## 2023-09-19 DIAGNOSIS — R5381 Other malaise: Secondary | ICD-10-CM | POA: Diagnosis not present

## 2023-09-19 DIAGNOSIS — S72012A Unspecified intracapsular fracture of left femur, initial encounter for closed fracture: Secondary | ICD-10-CM

## 2023-09-19 DIAGNOSIS — I2510 Atherosclerotic heart disease of native coronary artery without angina pectoris: Secondary | ICD-10-CM | POA: Diagnosis not present

## 2023-09-19 DIAGNOSIS — S72002D Fracture of unspecified part of neck of left femur, subsequent encounter for closed fracture with routine healing: Secondary | ICD-10-CM | POA: Diagnosis not present

## 2023-09-19 DIAGNOSIS — R451 Restlessness and agitation: Secondary | ICD-10-CM | POA: Diagnosis not present

## 2023-09-19 NOTE — Progress Notes (Signed)
 Post-Op Visit Note   Patient: James Ibarra           Date of Birth: [birth year]           MRN: [id number] Visit Date: 09/19/2023 PCP: Kip Righter, MD   Assessment & Plan:  Chief Complaint:  Chief Complaint  Patient presents with   Left Hip - Routine Post Op   Visit Diagnoses:  1. Subcapital fracture of neck of left femur, closed, initial encounter (HCC)   2. Pain in left hip     Plan: History of Present Illness James Ibarra is a 73 year old male who presents for follow-up after left hip replacement surgery for femoral neck fracture.  He experiences aches and pains, particularly discomfort related to the dressing on his surgical site. The sutures remain in place.  He is undergoing physical therapy, participating in exercises six days a week, and anticipates discharge home from SNF on Monday.  He has transitioned from Eliquis  to injectable anticoagulants, experiencing discomfort with the injections, including bruising and a nerve hit. He had an unusual blood clot on his right foot following a fall while on Eliquis .  Exam of the left hip shows fully healed surgical incision.  No signs of infection.  Sutures intact.  Leg lengths are equal.  Ambulates with a rolling walker.  Assessment and Plan Patient is 2 weeks postop from left total hip arthroplasty for femoral neck fracture. - Continue physical therapy six days a week. - Transition from Lovenox  to baby aspirin  twice daily. - Provide post-suture removal care instructions with Steri-Strips, self-removal in one week. - Facility to arrange home health physical therapy post-rehab discharge. - Follow-up in 4 weeks with Morna with repeat imaging.  Follow-Up Instructions: Return in about 4 weeks (around 10/17/2023) for with lindsey.   Orders:  Orders Placed This Encounter  Procedures   XR HIP UNILAT W OR W/O PELVIS 2-3 VIEWS LEFT   No orders of the defined types were placed in this encounter.   Imaging: XR  HIP UNILAT W OR W/O PELVIS 2-3 VIEWS LEFT Result Date: 09/19/2023 X-rays of the left hip show a stable left total hip replacement without complication   PMFS History: Patient Active Problem List   Diagnosis Date Noted   Malnutrition of moderate degree 09/05/2023   Subcapital fracture of neck of left femur, closed, initial encounter (HCC) 09/04/2023   Fall at home, initial encounter 09/04/2023   Left ankle injury, initial encounter 07/07/2023   Acute left ankle pain 07/07/2023   History of hypertension 07/07/2023   Hypercoagulable state due to paroxysmal atrial fibrillation (HCC) 06/21/2023   S/P MVR (mitral valve repair) 12/08/2022   Protein-calorie malnutrition, severe 08/19/2022   Alcohol withdrawal (HCC) 08/14/2022   Acute metabolic encephalopathy 08/14/2022   Paroxysmal atrial fibrillation with RVR (HCC) 08/14/2022   Nonrheumatic mitral valve regurgitation 08/14/2022   Mitral valve prolapse 08/14/2022   PNA (pneumonia) 08/12/2022   Severe sepsis (HCC) 08/12/2022   HLD (hyperlipidemia) 08/12/2022   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 Fib while hospitalized  June 2024   Heart murmur    History of nonmelanoma skin cancer    Hypercholesteremia    Hypertension    Pneumonia 08/2022   Trigeminal neuralgia     Family History  Problem Relation Age of Onset   Heart disease Father    Heart disease Mother     Past Surgical History:  Procedure Laterality Date   ATRIAL FIBRILLATION ABLATION N/A 05/24/2023   Procedure: ATRIAL FIBRILLATION ABLATION;  Surgeon: Nancey Eulas BRAVO, MD;  [facility]: [facility];   Service: Cardiovascular;  Laterality: N/A;   CATARACT EXTRACTION W/ INTRAOCULAR LENS IMPLANT Bilateral    CLIPPING OF ATRIAL APPENDAGE Left 12/08/2022   Procedure: CLIPPING OF ATRIAL APPENDAGE (LAA) USING 50 MM ATRICLIP;  Surgeon: Maryjane Mt, MD;  Location: [facility] OR;  Service: Open Heart Surgery;  Laterality: Left;   INGUINAL HERNIA REPAIR  11/07/2011   Procedure: LAPAROSCOPIC INGUINAL HERNIA;  Surgeon: Camellia CHRISTELLA Blush, MD,FACS;  Location: [facility];  Service: General;  Laterality: Left;   MAZE N/A 12/08/2022   Procedure: MAZE;  Surgeon: Maryjane Mt, MD;  Location: [facility];  Service: Open Heart Surgery;  Laterality: N/A;   MITRAL VALVE REPAIR N/A 12/08/2022   Procedure: MITRAL VALVE REPAIR (MVR) USING 34 MM SIMULUS SEMI- RIGID ANNULOPLASTY BAND;  Surgeon: Maryjane Mt, MD;  Location: [facility];  Service: Open Heart Surgery;  Laterality: N/A;   RETINAL DETACHMENT SURGERY  03/07/1995   RIGHT/LEFT HEART CATH AND CORONARY ANGIOGRAPHY N/A 10/12/2022   Procedure: RIGHT/LEFT HEART CATH AND CORONARY ANGIOGRAPHY;  Surgeon: Wendel Lurena POUR, MD;  [facility]: [facility];  Service: Cardiovascular;  Laterality: N/A;   SKIN CANCER EXCISION  2012, 1999   basal cell - neck and hand   TEE WITHOUT CARDIOVERSION N/A 10/16/2022   Procedure: TRANSESOPHAGEAL ECHOCARDIOGRAM;  Surgeon: Santo Stanly LABOR, MD;  [facility]: [facility];  Service: Cardiovascular;  Laterality: N/A;   TEE WITHOUT CARDIOVERSION N/A 12/08/2022   Procedure: TRANSESOPHAGEAL ECHOCARDIOGRAM;  Surgeon: Maryjane Mt, MD;  Location: [facility] OR;  Service: Open Heart Surgery;  Laterality: N/A;   TOTAL HIP ARTHROPLASTY Left 09/05/2023   Procedure: ARTHROPLASTY, HIP, TOTAL, ANTERIOR APPROACH;  Surgeon: Jerri Kay CHRISTELLA, MD;  Location: [facility];  Service: Orthopedics;  Laterality: Left;   Social History   Occupational History   Not on file  Tobacco Use   Smoking status: Never   Smokeless tobacco: Never   Tobacco comments:    Never smoked 06/21/23   Vaping Use   Vaping status: Never Used  Substance and Sexual Activity   Alcohol use: Not Currently   Drug use: No   Sexual activity: Not on file

## 2023-09-20 DIAGNOSIS — S72001D Fracture of unspecified part of neck of right femur, subsequent encounter for closed fracture with routine healing: Secondary | ICD-10-CM | POA: Diagnosis not present

## 2023-09-20 DIAGNOSIS — M6281 Muscle weakness (generalized): Secondary | ICD-10-CM | POA: Diagnosis not present

## 2023-09-20 DIAGNOSIS — R2689 Other abnormalities of gait and mobility: Secondary | ICD-10-CM | POA: Diagnosis not present

## 2023-09-20 DIAGNOSIS — E441 Mild protein-calorie malnutrition: Secondary | ICD-10-CM | POA: Diagnosis not present

## 2023-09-20 DIAGNOSIS — S72002D Fracture of unspecified part of neck of left femur, subsequent encounter for closed fracture with routine healing: Secondary | ICD-10-CM | POA: Diagnosis not present

## 2023-09-21 ENCOUNTER — Other Ambulatory Visit: Payer: Self-pay | Admitting: Neurology

## 2023-09-21 DIAGNOSIS — R451 Restlessness and agitation: Secondary | ICD-10-CM | POA: Diagnosis not present

## 2023-09-21 DIAGNOSIS — I4891 Unspecified atrial fibrillation: Secondary | ICD-10-CM | POA: Diagnosis not present

## 2023-09-21 DIAGNOSIS — I2510 Atherosclerotic heart disease of native coronary artery without angina pectoris: Secondary | ICD-10-CM | POA: Diagnosis not present

## 2023-09-21 DIAGNOSIS — R5381 Other malaise: Secondary | ICD-10-CM | POA: Diagnosis not present

## 2023-09-21 DIAGNOSIS — I10 Essential (primary) hypertension: Secondary | ICD-10-CM | POA: Diagnosis not present

## 2023-09-21 DIAGNOSIS — S72002D Fracture of unspecified part of neck of left femur, subsequent encounter for closed fracture with routine healing: Secondary | ICD-10-CM | POA: Diagnosis not present

## 2023-09-24 DIAGNOSIS — S72002D Fracture of unspecified part of neck of left femur, subsequent encounter for closed fracture with routine healing: Secondary | ICD-10-CM | POA: Diagnosis not present

## 2023-09-24 DIAGNOSIS — I10 Essential (primary) hypertension: Secondary | ICD-10-CM | POA: Diagnosis not present

## 2023-09-24 DIAGNOSIS — I4891 Unspecified atrial fibrillation: Secondary | ICD-10-CM | POA: Diagnosis not present

## 2023-09-24 DIAGNOSIS — R5381 Other malaise: Secondary | ICD-10-CM | POA: Diagnosis not present

## 2023-09-24 DIAGNOSIS — R451 Restlessness and agitation: Secondary | ICD-10-CM | POA: Diagnosis not present

## 2023-09-24 DIAGNOSIS — I2510 Atherosclerotic heart disease of native coronary artery without angina pectoris: Secondary | ICD-10-CM | POA: Diagnosis not present

## 2023-09-25 DIAGNOSIS — I10 Essential (primary) hypertension: Secondary | ICD-10-CM | POA: Diagnosis not present

## 2023-09-25 DIAGNOSIS — I2510 Atherosclerotic heart disease of native coronary artery without angina pectoris: Secondary | ICD-10-CM | POA: Diagnosis not present

## 2023-09-25 DIAGNOSIS — G909 Disorder of the autonomic nervous system, unspecified: Secondary | ICD-10-CM | POA: Diagnosis not present

## 2023-09-25 DIAGNOSIS — Z9181 History of falling: Secondary | ICD-10-CM | POA: Diagnosis not present

## 2023-09-25 DIAGNOSIS — S72012D Unspecified intracapsular fracture of left femur, subsequent encounter for closed fracture with routine healing: Secondary | ICD-10-CM | POA: Diagnosis not present

## 2023-09-25 DIAGNOSIS — Z604 Social exclusion and rejection: Secondary | ICD-10-CM | POA: Diagnosis not present

## 2023-09-25 DIAGNOSIS — F5101 Primary insomnia: Secondary | ICD-10-CM | POA: Diagnosis not present

## 2023-09-25 DIAGNOSIS — I4891 Unspecified atrial fibrillation: Secondary | ICD-10-CM | POA: Diagnosis not present

## 2023-09-27 ENCOUNTER — Telehealth (HOSPITAL_COMMUNITY): Payer: Self-pay

## 2023-09-27 NOTE — Telephone Encounter (Signed)
 Therapist receives a message from [place] who asks about services. He says he needs some help with alcohol, depression, anxiety, and adjusting to retirement.  Therapist returns the call and confirms Dan's identity through obtaining two verifiers.  Therapist explains the services that are available within the [employer].  Rolan says he is not quite sure what he needs thought he says it would probably be best to start out with individual.    Given he has Medicare. This therapist will ask Zell Maier, [facility], [facility] , LCAS to call him to offer an appointment.  Darice Simpler, MS, LMFT, LCAS

## 2023-09-28 ENCOUNTER — Telehealth (HOSPITAL_COMMUNITY): Payer: Self-pay | Admitting: Licensed Clinical Social Worker

## 2023-09-28 NOTE — Telephone Encounter (Signed)
 The therapist returns James Ibarra's call. He says that he and his wife believe it would be good for him to be in some kind of program as he used to have a drinking problem saying that he had a fall around July 4th related to alcohol.   He schedules to see this therapist for an assessment on 10/09/23. He recently had contact with Ms. Darice Simpler, LMFT, LCAS in advance of this call.  Zell Maier, MA, LCSW, [facility], LCAS 09/28/2023

## 2023-10-04 DIAGNOSIS — I2510 Atherosclerotic heart disease of native coronary artery without angina pectoris: Secondary | ICD-10-CM | POA: Diagnosis not present

## 2023-10-04 DIAGNOSIS — S72012D Unspecified intracapsular fracture of left femur, subsequent encounter for closed fracture with routine healing: Secondary | ICD-10-CM | POA: Diagnosis not present

## 2023-10-04 DIAGNOSIS — I4891 Unspecified atrial fibrillation: Secondary | ICD-10-CM | POA: Diagnosis not present

## 2023-10-04 DIAGNOSIS — Z604 Social exclusion and rejection: Secondary | ICD-10-CM | POA: Diagnosis not present

## 2023-10-04 DIAGNOSIS — I10 Essential (primary) hypertension: Secondary | ICD-10-CM | POA: Diagnosis not present

## 2023-10-04 DIAGNOSIS — G909 Disorder of the autonomic nervous system, unspecified: Secondary | ICD-10-CM | POA: Diagnosis not present

## 2023-10-04 DIAGNOSIS — Z9181 History of falling: Secondary | ICD-10-CM | POA: Diagnosis not present

## 2023-10-04 DIAGNOSIS — F5101 Primary insomnia: Secondary | ICD-10-CM | POA: Diagnosis not present

## 2023-10-08 DIAGNOSIS — Z09 Encounter for follow-up examination after completed treatment for conditions other than malignant neoplasm: Secondary | ICD-10-CM | POA: Diagnosis not present

## 2023-10-08 DIAGNOSIS — S72009A Fracture of unspecified part of neck of unspecified femur, initial encounter for closed fracture: Secondary | ICD-10-CM | POA: Diagnosis not present

## 2023-10-08 DIAGNOSIS — I10 Essential (primary) hypertension: Secondary | ICD-10-CM | POA: Diagnosis not present

## 2023-10-08 DIAGNOSIS — E785 Hyperlipidemia, unspecified: Secondary | ICD-10-CM | POA: Diagnosis not present

## 2023-10-08 DIAGNOSIS — R413 Other amnesia: Secondary | ICD-10-CM | POA: Diagnosis not present

## 2023-10-09 ENCOUNTER — Ambulatory Visit (INDEPENDENT_AMBULATORY_CARE_PROVIDER_SITE_OTHER): Payer: Self-pay | Admitting: Licensed Clinical Social Worker

## 2023-10-09 ENCOUNTER — Ambulatory Visit (INDEPENDENT_AMBULATORY_CARE_PROVIDER_SITE_OTHER): Admitting: Physician Assistant

## 2023-10-09 ENCOUNTER — Ambulatory Visit (INDEPENDENT_AMBULATORY_CARE_PROVIDER_SITE_OTHER)

## 2023-10-09 DIAGNOSIS — F1020 Alcohol dependence, uncomplicated: Secondary | ICD-10-CM

## 2023-10-09 DIAGNOSIS — M25552 Pain in left hip: Secondary | ICD-10-CM

## 2023-10-09 NOTE — Progress Notes (Signed)
 Post-Op Visit Note   Patient: James Ibarra           Date of Birth: [birth year]           MRN: [id number] Visit Date: 10/09/2023 PCP: Kip Righter, MD   Assessment & Plan:  Chief Complaint:  Chief Complaint  Patient presents with   Left Hip - Pain   Visit Diagnoses:  1. Pain in left hip     Plan: Patient is a pleasant 73 year old gentleman who comes in today 5 weeks status post left total hip replacement from a femoral neck fracture, date of surgery 09/05/2023.  He has been doing well.  He notes mild occasional discomfort and does not take anything for this.  He is currently getting home health PT and ambulating with a single-point cane.  He was ambulating without assistance prior to his fall.  Examination of his left hip reveals painless hip flexion.  Painless logroll.  He is neurovascularly intact distally.  At this point, he will continue to advance with activity as tolerated.  Follow-up in 7 weeks when he is 12 weeks out for repeat evaluation and AP pelvis x-rays.  Call with concerns or questions in the meantime.  Follow-Up Instructions: Return in about 7 weeks (around 11/27/2023).   Orders:  Orders Placed This Encounter  Procedures   XR HIP UNILAT W OR W/O PELVIS 2-3 VIEWS LEFT   No orders of the defined types were placed in this encounter.   Imaging: No results found.  PMFS History: Patient Active Problem List   Diagnosis Date Noted   Malnutrition of moderate degree 09/05/2023   Subcapital fracture of neck of left femur, closed, initial encounter (HCC) 09/04/2023   Fall at home, initial encounter 09/04/2023   Left ankle injury, initial encounter 07/07/2023   Acute left ankle pain 07/07/2023   History of hypertension 07/07/2023   Hypercoagulable state due to paroxysmal atrial fibrillation (HCC) 06/21/2023   S/P MVR (mitral valve repair) 12/08/2022   Protein-calorie malnutrition, severe 08/19/2022   Alcohol withdrawal (HCC) 08/14/2022   Acute metabolic  encephalopathy 08/14/2022   Paroxysmal atrial fibrillation with RVR (HCC) 08/14/2022   Nonrheumatic mitral valve regurgitation 08/14/2022   Mitral valve prolapse 08/14/2022   PNA (pneumonia) 08/12/2022   Severe sepsis (HCC) 08/12/2022   HLD (hyperlipidemia) 08/12/2022   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 Fib while hospitalized June 2024   Heart murmur    History of nonmelanoma skin cancer    Hypercholesteremia    Hypertension    Pneumonia 08/2022   Trigeminal neuralgia     Family History  Problem Relation Age of Onset   Heart disease Father    Heart disease Mother     Past Surgical History:  Procedure Laterality Date   ATRIAL FIBRILLATION ABLATION N/A 05/24/2023   Procedure: ATRIAL FIBRILLATION ABLATION;  Surgeon: Nancey Eulas BRAVO, MD;  Location: [facility];  Service: Cardiovascular;  Laterality: N/A;   CATARACT EXTRACTION W/ INTRAOCULAR LENS IMPLANT Bilateral    CLIPPING OF ATRIAL APPENDAGE Left 12/08/2022   Procedure:  CLIPPING OF ATRIAL APPENDAGE (LAA) USING 50 MM ATRICLIP;  Surgeon: Maryjane Mt, MD;  Location: [facility];  Service: Open Heart Surgery;  Laterality: Left;   INGUINAL HERNIA REPAIR  11/07/2011   Procedure: LAPAROSCOPIC INGUINAL HERNIA;  Surgeon: Camellia CHRISTELLA Blush, MD,FACS;  Location: [facility];  Service: General;  Laterality: Left;   MAZE N/A 12/08/2022   Procedure: MAZE;  Surgeon: Maryjane Mt, MD;  Location: [facility] OR;  Service: Open Heart Surgery;  Laterality: N/A;   MITRAL VALVE REPAIR N/A 12/08/2022   Procedure: MITRAL VALVE REPAIR (MVR) USING 34 MM SIMULUS SEMI- RIGID ANNULOPLASTY BAND;   Surgeon: Maryjane Mt, MD;  Location: [facility];  Service: Open Heart Surgery;  Laterality: N/A;   RETINAL DETACHMENT SURGERY  03/07/1995   RIGHT/LEFT HEART CATH AND CORONARY ANGIOGRAPHY N/A 10/12/2022   Procedure: RIGHT/LEFT HEART CATH AND CORONARY ANGIOGRAPHY;  Surgeon: Wendel Lurena POUR, MD;  [facility];  Service: Cardiovascular;  Laterality: N/A;   SKIN CANCER EXCISION  2012, 1999   basal cell - neck and hand   TEE WITHOUT CARDIOVERSION N/A 10/16/2022   Procedure: TRANSESOPHAGEAL ECHOCARDIOGRAM;  Surgeon: Santo Stanly LABOR, MD;  [facility];  Service: Cardiovascular;  Laterality: N/A;   TEE WITHOUT CARDIOVERSION N/A 12/08/2022   Procedure: TRANSESOPHAGEAL ECHOCARDIOGRAM;  Surgeon: Maryjane Mt, MD;  Location: [facility] OR;  Service: Open Heart Surgery;  Laterality: N/A;   TOTAL HIP ARTHROPLASTY Left 09/05/2023   Procedure: ARTHROPLASTY, HIP, TOTAL, ANTERIOR APPROACH;  Surgeon: Jerri Kay CHRISTELLA, MD;  Location: [facility];  Service: Orthopedics;  Laterality: Left;   Social History   Occupational History   Not on file  Tobacco Use   Smoking status: Never   Smokeless tobacco: Never   Tobacco comments:    Never smoked 06/21/23  Vaping Use   Vaping status: Never Used  Substance and Sexual Activity   Alcohol use: Not Currently   Drug use: No   Sexual activity: Not on file

## 2023-10-09 NOTE — Progress Notes (Unsigned)
 THERAPIST PROGRESS NOTE  Session Time: 2:40 p.m.to 4:10 p.m.   Type of Therapy: Individual   Therapist Response/Interventions: {CHL AMB BH Type of Intervention:[id number]}  Treatment Goals addressed: ***  Summary: James Ibarra presents today accompanied by his wife.  He says that he is here because he has been drinking too much for no particular route.  His wife says that they both had a virus last summer and that Dan's drinking caused him to become sick with sepsis and be admitted to the hospital.  While in the hospital, he went into the DTs and had to be admitted to ICU.  She says that he went to a rehab facility last June to get his strength back and reportedly stopped drinking; however, she discovered that he was sneaking drinks of which she was not aware.  James Ibarra says that he probably started sipping alcohol around June of this year.  As a result of drinking, he fell and broke his hip around the beginning of the July and was once again hospitalized.  In addition to the broken hip, he was admitted in part due to malnutrition.  Today, he walks with an unsteady gait with his wife having to walk next to him in the event that he may fall.  James Ibarra believes that his drinking increased after he retired in 2020.  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    His wife says that he divorced his first wife when he was in his 1s and did not get with her until he was in his 63s.  Prior to getting with her, she says that sends work was his entire life.  He has an adult son with whom he was not communicating but his wife says that his son came down to visit Dan after this recent medical episode and that they have reconnected.  His wife says that she and end were moderate drinkers and indicates that she had an addiction to clonazepam 10 years ago but got off of it and has also stopped drinking completely.  She says  that when they were drinking together that she and an would have a glass or 2 of wine in the evenings.  James Ibarra did not start drinking liquor until they went to [place] about 8 years ago and he was introduced to rum.  She says that last year, prior to being admitted for sepsis, that James Ibarra would drink a handle of rum in 3 days.  James Ibarra initially denies any family history for addiction but his wife reminds him that he has a paternal aunt who was an alcoholic.  Additionally, he does not know much history about his dad side of the family.  His wife says that for months prior to his admission that he was spending most of his time in bed so it is possible that he might have been drinking earlier than reported.  She says that she is at the point that if he does not quit drinking that she might have to leave the marriage and says that her family is angry at than for what he has put her through so far.  James Ibarra reports being surprised to learn about the severity of his alcohol withdrawal and the impact it has had on his health in addition to realizing that he is at risk of losing his marriage if he does not stop.  Presently, he has no sober  supports and his wife says that he stopped taking his friends calls; however, it sounds as though most of Dan's friends were drinkers.  By the end of the session, James Ibarra expresses a willingness to start attending AA meetings with his wife saying that she is willing to attend some with him.  His wife says that she has her own therapist and says that she is also going to start attending Al-Anon meetings.  James Ibarra will attend meetings and will return with his wife to see this therapist again in 1 week.   Progress Towards Goals: Initial  Suicidal/Homicidal: No SI or HI  Plan: Return again in 1 weeks.  Diagnosis: Alcohol Use Disorder, Severe  Collaboration of Care: Other Wife attends this session with Dan's permission  Patient/Guardian was advised Release of Information must be obtained  prior to any record release in order to collaborate their care with an outside provider. Patient/Guardian was advised if they have not already done so to contact the registration department to sign all necessary forms in order for us  to release information regarding their care.   Consent: Patient/Guardian gives verbal consent for treatment and assignment of benefits for services provided during this visit. Patient/Guardian expressed understanding and agreed to proceed.   Zell Maier, MA, LCSW, [facility], LCAS 10/09/2023

## 2023-10-11 ENCOUNTER — Encounter (HOSPITAL_COMMUNITY): Payer: Self-pay

## 2023-10-15 DIAGNOSIS — I2510 Atherosclerotic heart disease of native coronary artery without angina pectoris: Secondary | ICD-10-CM | POA: Diagnosis not present

## 2023-10-15 DIAGNOSIS — S72012D Unspecified intracapsular fracture of left femur, subsequent encounter for closed fracture with routine healing: Secondary | ICD-10-CM | POA: Diagnosis not present

## 2023-10-15 DIAGNOSIS — Z604 Social exclusion and rejection: Secondary | ICD-10-CM | POA: Diagnosis not present

## 2023-10-15 DIAGNOSIS — I4891 Unspecified atrial fibrillation: Secondary | ICD-10-CM | POA: Diagnosis not present

## 2023-10-15 DIAGNOSIS — Z9181 History of falling: Secondary | ICD-10-CM | POA: Diagnosis not present

## 2023-10-15 DIAGNOSIS — F5101 Primary insomnia: Secondary | ICD-10-CM | POA: Diagnosis not present

## 2023-10-15 DIAGNOSIS — I10 Essential (primary) hypertension: Secondary | ICD-10-CM | POA: Diagnosis not present

## 2023-10-15 DIAGNOSIS — G909 Disorder of the autonomic nervous system, unspecified: Secondary | ICD-10-CM | POA: Diagnosis not present

## 2023-10-16 ENCOUNTER — Ambulatory Visit (HOSPITAL_COMMUNITY): Admitting: Licensed Clinical Social Worker

## 2023-10-16 DIAGNOSIS — F1020 Alcohol dependence, uncomplicated: Secondary | ICD-10-CM

## 2023-10-16 NOTE — Progress Notes (Signed)
 THERAPIST PROGRESS NOTE  Session Time: 3 p.m. to 4:05 p.m.   Type of Therapy: Individual   Therapist Response/Interventions: Solution-Focused/The therapist shares research on the impact of Twelve Step attendance on long-term sobriety versus those who do not attend and educates James Ibarra and his wife about addiction as a medical illness with a genetic component and how the disease progresses over a long period of time if it does not go into remission.  Treatment Goals addressed:  Active     Substance Use     James Ibarra will remain sober from alcohol per self-report, his wife's report, and random UDS as indicated while developing a sober support system and coming up with a meaning or purpose for his life post-retirement.  (Not Progressing)     Start:  10/11/23    Expected End:  04/12/24         Therapist will assist James Ibarra in having a better understanding of the disease of addiction in addition to learning how to identify and avoid triggers for drinking and assist him in overcoming barriers to developing a sober support system.      Start:  10/11/23               Summary: James Ibarra presents accompanied by his wife who is upset that James Ibarra did not attend an AA meeting. He seemed to be under the impression that he needed to contact someone at the meeting before doing so with his wife informing him that he can just go.   His wife says that they were going to go out with a friend for dinner with this friend knowing of James Ibarra's issues with alcohol but the friend declined to go if she could not drink and becoming upset about this. James Ibarra and his wife were surprised about this but throughout the session discuss how they are starting to realize what a large part of their lives alcohol played. His wife says that they met in part due to alcohol. She says that she used to buy two large boxes of wine per week which she did not really drink and did not really consider that this was a lot for James Ibarra to be drinking. Once she stopped  benzodiazepines, her view on things became more clear.  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  By session's end, he agrees to attend  an AA meeting and he and his wife are both very interested in watching Dr. Franky Coy video concerning the science of addiction which the therapist emails to them.   The therapist will hopefully complete James Ibarra's CCA at his next visit but deferred doing so due to clinical considerations.  Progress Towards Goals: Progressing with not drinking but not attending AA yet  Suicidal/Homicidal: No SI or HI  Plan: Return again in 2 weeks.  Diagnosis: Alcohol Use Disorder, Severe  Collaboration of Care: Other Wife attends this session with James Ibarra's permission  Patient/Guardian was advised Release of Information must be obtained prior to any record release in order to collaborate their care with an outside provider. Patient/Guardian was advised if they have not already done so to contact the registration department to sign all necessary forms in order for us  to release information regarding their care.   Consent: Patient/Guardian gives verbal consent for treatment and assignment of benefits  for services provided during this visit. Patient/Guardian expressed understanding and agreed to proceed.   Zell Maier, MA, LCSW, [facility] LCAS 10/16/2023

## 2023-10-21 DIAGNOSIS — I4891 Unspecified atrial fibrillation: Secondary | ICD-10-CM | POA: Diagnosis not present

## 2023-10-21 DIAGNOSIS — Z604 Social exclusion and rejection: Secondary | ICD-10-CM | POA: Diagnosis not present

## 2023-10-21 DIAGNOSIS — I10 Essential (primary) hypertension: Secondary | ICD-10-CM | POA: Diagnosis not present

## 2023-10-21 DIAGNOSIS — G909 Disorder of the autonomic nervous system, unspecified: Secondary | ICD-10-CM | POA: Diagnosis not present

## 2023-10-21 DIAGNOSIS — F5101 Primary insomnia: Secondary | ICD-10-CM | POA: Diagnosis not present

## 2023-10-21 DIAGNOSIS — Z9181 History of falling: Secondary | ICD-10-CM | POA: Diagnosis not present

## 2023-10-21 DIAGNOSIS — I2510 Atherosclerotic heart disease of native coronary artery without angina pectoris: Secondary | ICD-10-CM | POA: Diagnosis not present

## 2023-10-21 DIAGNOSIS — S72012D Unspecified intracapsular fracture of left femur, subsequent encounter for closed fracture with routine healing: Secondary | ICD-10-CM | POA: Diagnosis not present

## 2023-10-30 ENCOUNTER — Ambulatory Visit (HOSPITAL_COMMUNITY): Admitting: Licensed Clinical Social Worker

## 2023-10-30 DIAGNOSIS — F5101 Primary insomnia: Secondary | ICD-10-CM | POA: Diagnosis not present

## 2023-10-30 DIAGNOSIS — I4891 Unspecified atrial fibrillation: Secondary | ICD-10-CM | POA: Diagnosis not present

## 2023-10-30 DIAGNOSIS — G909 Disorder of the autonomic nervous system, unspecified: Secondary | ICD-10-CM | POA: Diagnosis not present

## 2023-10-30 DIAGNOSIS — I2510 Atherosclerotic heart disease of native coronary artery without angina pectoris: Secondary | ICD-10-CM | POA: Diagnosis not present

## 2023-10-30 DIAGNOSIS — I10 Essential (primary) hypertension: Secondary | ICD-10-CM | POA: Diagnosis not present

## 2023-10-30 DIAGNOSIS — Z604 Social exclusion and rejection: Secondary | ICD-10-CM | POA: Diagnosis not present

## 2023-10-30 DIAGNOSIS — Z9181 History of falling: Secondary | ICD-10-CM | POA: Diagnosis not present

## 2023-10-30 DIAGNOSIS — S72012D Unspecified intracapsular fracture of left femur, subsequent encounter for closed fracture with routine healing: Secondary | ICD-10-CM | POA: Diagnosis not present

## 2023-10-30 DIAGNOSIS — F1020 Alcohol dependence, uncomplicated: Secondary | ICD-10-CM

## 2023-10-30 NOTE — Progress Notes (Unsigned)
 THERAPIST PROGRESS NOTE  Session Time: 2:02 p.m. to 3 pp  Type of Therapy: Individual   Therapist Response/Interventions: Solution-Focused/The therapist shares research on the impact of Twelve Step attendance on long-term sobriety versus those who do not attend and educates Rolan and his wife about addiction as a medical illness with a genetic component and how the disease progresses over a long period of time if it does not go into remission.  Treatment Goals addressed:         Summary: Rolan presents accompanied by his wife who is upset that Rolan did not attend an AA meeting. He seemed to be under the impression that he needed to contact someone at the meeting before doing so with his wife informing him that he can just go.   His wife says that they were going to go out with a friend for dinner with this friend knowing of Dan's issues with alcohol but the friend declined to go if she could not drink and becoming upset about this. Rolan and his wife were surprised about this but throughout the session discuss how they are starting to realize what a large part of their lives alcohol played. His wife says that they met in part due to alcohol. She says that she used to buy two large boxes of wine per week which she did not really drink and did not really consider that this was a lot for Dan to be drinking. Once she stopped benzodiazepines, her view on things became more clear.  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  By session's end, he agrees to attend  an AA meeting and he and his wife are both very interested in watching Dr. Franky Coy video concerning the science of addiction which the therapist emails to them.   The therapist will hopefully complete  Dan's CCA at his next visit but deferred doing so due to clinical considerations.  Progress Towards Goals: Progressing with not drinking but not attending AA yet  Suicidal/Homicidal: No SI or HI  Plan: Return again in 2 weeks.  Diagnosis: Alcohol Use Disorder, Severe  Collaboration of Care: Other Wife attends this session with Dan's permission  Patient/Guardian was advised Release of Information must be obtained prior to any record release in order to collaborate their care with an outside provider. Patient/Guardian was advised if they have not already done so to contact the registration department to sign all necessary forms in order for us  to release information regarding their care.   Consent: Patient/Guardian gives verbal consent for treatment and assignment of benefits for services provided during this visit. Patient/Guardian expressed understanding and agreed to proceed.   Zell Maier, MA, LCSW, [facility], LCAS 10/16/2023

## 2023-10-31 NOTE — Progress Notes (Unsigned)
 Comprehensive Clinical Assessment (CCA) Note  10/31/2023 James Ibarra [id number]  Chief Complaint: No chief complaint on file.  Visit Diagnosis: Alcohol Use Disorder, Severe   Summary: James Ibarra returns having attended no AA meetings and admitting that he tried a capful of cooking wine as he was curious about how it tasted. His wife is upset he has not attend meetings and at his minimizing his drinking saying that she is at the point of leaving him saying that she cannot have her grandchildren around him but did not want to tell him.    CCA Screening, Triage and Referral (STR)  Patient Reported Information How did you hear about us ? Family/Friend  Referral name: wife  Referral phone number: No data recorded  Whom do you see for routine medical problems? Primary Care  Practice/Facility Name: -- (has appointments scheduled with a Neurologist and Cardiologist)  Practice/Facility Phone Number: No data recorded Name of Contact: No data recorded Contact Number: No data recorded Contact Fax Number: No data recorded Prescriber Name: No data recorded Prescriber Address (if known): No data recorded  What Is the Reason for Your Visit/Call Today? No data recorded How Long Has This Been Causing You Problems? > than 6 months  What Do You Feel Would Help You the Most Today? Alcohol or Drug Use Treatment   Have You Recently Been in Any Inpatient Treatment (Hospital/Detox/Crisis Center/28-Day Program)? Yes  Name/Location of Program/Hospital:No data recorded How Long Were You There? No data recorded When Were You Discharged? No data recorded  Have You Ever Received Services From [facility] Before? Yes  Who Do You See at [facility]? No data recorded  Have You Recently Had Any Thoughts About Hurting Yourself? No  Are You Planning to Commit Suicide/Harm Yourself At This time? No   Have you Recently Had Thoughts About Hurting Someone Sher[place]? No  Explanation: No data recorded  Have You  Used Any Alcohol or Drugs in the Past 24 Hours? No  How Long Ago Did You Use Drugs or Alcohol? No data recorded What Did You Use and How Much? No data recorded  Do You Currently Have a Therapist/Psychiatrist? No  Name of Therapist/Psychiatrist: No data recorded  Have You Been Recently Discharged From Any Office Practice or Programs? No  Explanation of Discharge From Practice/Program: No data recorded    CCA Screening Triage Referral Assessment Type of Contact: Face-to-Face  Is this Initial or Reassessment? No data recorded Date Telepsych consult ordered in [facility]:  No data recorded Time Telepsych consult ordered in [facility]:  No data recorded  Patient Reported Information Reviewed? No data recorded Patient Left Without Being Seen? No data recorded Reason for Not Completing Assessment: No data recorded  Collateral Involvement: Wife   Does Patient Have a Court Appointed Legal Guardian? No data recorded Name and Contact of Legal Guardian: No data recorded If Minor and Not Living with Parent(s), Who has Custody? No data recorded Is CPS involved or ever been involved? Never  Is APS involved or ever been involved? Never   Patient Determined To Be At Risk for Harm To Self or Others Based on Review of Patient Reported Information or Presenting Complaint? No  Method: No data recorded Availability of Means: No data recorded Intent: No data recorded Notification Required: No data recorded Additional Information for Danger to Others Potential: No data recorded Additional Comments for Danger to Others Potential: No data recorded Are There Guns or Other Weapons in Your Home? Yes  Types of Guns/Weapons: No data recorded  Are These Weapons Safely Secured?                            Yes  Who Could Verify You Are Able To Have These Secured: No data recorded Do You Have any Outstanding Charges, Pending Court Dates, Parole/Probation? No data recorded Contacted To Inform of Risk of Harm To Self  or Others: No data recorded  Location of Assessment: Other (comment) ([address])   Does Patient Present under Involuntary Commitment? No  IVC Papers Initial File Date: No data recorded  [place] of Residence: [place]   Patient Currently Receiving the Following Services: No data recorded  Determination of Need: Routine (7 days)   Options For Referral: Chemical Dependency Intensive Outpatient Therapy (CDIOP); Outpatient Therapy     CCA Biopsychosocial Intake/Chief Complaint:  alcohol use disorder leading to inpatient admissions for DTs and malnutrition.  Current Symptoms/Problems: James Ibarra has not attended any AA meetings since he last met with this therapist and tasted a capful of cooking wine since he was last seen. He appears to be in Precontemplation concerning his drinking not realizing that he almost died from withdrawals and that his wife is about to leave him. He minimizes the problem as he was able to not drink for a few months.   Patient Reported Schizophrenia/Schizoaffective Diagnosis in Past: No   Strengths: intelligent  Preferences: No data recorded Abilities: No data recorded  Type of Services Patient Feels are Needed: No data recorded  Initial Clinical Notes/Concerns: No data recorded  Mental Health Symptoms Depression:  -- (see PHQ-9)   Duration of Depressive symptoms: No data recorded  Mania:  None   Anxiety:   -- (see GAD-7)   Psychosis:  None   Duration of Psychotic symptoms: No data recorded  Trauma:  None   Obsessions:  None   Compulsions:  None   Inattention:  None   Hyperactivity/Impulsivity:  None   Oppositional/Defiant Behaviors:  None   Emotional Irregularity:  None   Other Mood/Personality Symptoms:  No data recorded   Mental Status Exam Appearance and self-care  Stature:  Tall   Weight:  Thin   Clothing:  No data recorded  Grooming:  Normal   Cosmetic use:  None   Posture/gait:  Stooped   Motor activity:  Not  Remarkable   Sensorium  Attention:  Normal   Concentration:  Preoccupied   Orientation:  X5   Recall/memory:  Normal   Affect and Mood  Affect:  Restricted   Mood:  Depressed   Relating  Eye contact:  Normal   Facial expression:  Constricted   Attitude toward examiner:  Passive   Thought and Language  Speech flow: Clear and Coherent   Thought content:  Appropriate to Mood and Circumstances   Preoccupation:  Homicidal   Hallucinations:  None   Organization:  No data recorded  [employer] of Knowledge:  Good   Intelligence:  Above Average   Abstraction:  Abstract   Judgement:  Impaired   Reality Testing:  Adequate   Insight:  Poor   Decision Making:  Vacilates   Social Functioning  Social Maturity:  Isolates   Social Judgement:  Normal   Stress  Stressors:  Family conflict; Illness   Coping Ability:  Deficient supports   Skill Deficits:  Self-control   Supports:  Family     Religion: Religion/Spirituality Are You A Religious Person?: No  Leisure/Recreation: Leisure / Recreation  Do You Have Hobbies?: No  Exercise/Diet: Exercise/Diet Do You Exercise?: No Have You Gained or Lost A Significant Amount of Weight in the Past Six Months?: No Do You Follow a Special Diet?: No Do You Have Any Trouble Sleeping?: No   CCA Employment/Education Employment/Work Situation: Employment / Work Academic librarian Situation: Retired Therapist, art is the [employer] Time Patient has Held a Job?: thirty-something years Where was the Patient Employed at that Time?: the [employer] as a Engineer, production Has Patient ever Been in Equities trader?: No  Education: Education Is Patient Currently Attending School?: No Last Grade Completed: 18 Name of High School: in [place]  Did You Graduate From [employer]?: Yes Did Theme park manager?: Yes Did You Attend Graduate School?: Yes What is Your Post Graduate Degree?: 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 Did You Have An Individualized Education Program (IIEP): No Did You Have Any Difficulty At [employer]?: No Patient's Education Has Been Impacted by Current Illness: No   CCA Family/Childhood History Family and Relationship History: Family history Marital status: Married Number of Years Married: 13 What types of issues is patient dealing with in the relationship?: drinking alcohol Does patient have children?: Yes How many children?: 1 How is patient's relationship with their children?: son age 97; gets along fine but does not communicate a lot  Childhood History:  Childhood History By whom was/is the patient raised?: Both parents Additional childhood history information: was a government brat born in [place]; father was in the [employer] as a TEFL teacher; he describes his childhood as happy and uneventful; father would have one drink in the evenings Patient's description of current relationship with people who raised him/her: parents are deceased How were you disciplined when you got in trouble as a child/adolescent?: occasional symbolic wack on the bottom Does patient have siblings?: Yes Number of Siblings: 2 Description of patient's current relationship with siblings: brother older and sister younger Did patient suffer any verbal/emotional/physical/sexual abuse as a child?: No Did patient suffer from severe childhood neglect?: No Has patient ever been sexually abused/assaulted/raped as an adolescent or adult?: No Was the patient ever a victim of a crime or a disaster?: No Witnessed domestic violence?: No Has patient been affected by domestic violence as an adult?: No  Child/Adolescent Assessment:     CCA Substance Use Alcohol/Drug Use: Alcohol / Drug Use Pain Medications: No pain meds Over the Counter: Magnesium , Thiamine , and Calcium  History of alcohol / drug use?: Yes Longest period of sobriety (when/how long): 10 months from June of 2024 until May or June of  this year Negative Consequences of Use: Personal relationships Withdrawal Symptoms: DTs Substance #1 Name of Substance 1: Alcohol 1 - Age of First Use: 18 1 - Amount (size/oz): half gallon of wine per week and two boxes of wine per week 1 - Frequency: daily 1 - Duration: wife says James Ibarra started drinking heavily around 2022 1 - Last Use / Amount: capful of wine/Monday ot this week 1 - Method of Aquiring: legal 1- Route of Use: oral Substance #2 Name of Substance 2: nicotine pouch 2 - Age of First Use: 65 2 - Amount (size/oz): ten pouches per day 2 - Frequency: daily 2 - Duration: X8 years 2 - Method of Aquiring: legal 2 - Route of Substance Use: oral                     ASAM's:  Six Dimensions of Multidimensional Assessment  Dimension 1:  Acute Intoxication and/or Withdrawal Potential:  Dimension 1:  Description of individual's past and current experiences of substance use and withdrawal: history of DTs  Dimension 2:  Biomedical Conditions and Complications:   Dimension 2:  Description of patient's biomedical conditions and  complications: gait somewhat slow and unsteady as still recovering from malnutrition  Dimension 3:  Emotional, Behavioral, or Cognitive Conditions and Complications:  Dimension 3:  Description of emotional, behavioral, or cognitive conditions and complications: reports mild depression; however, he engages in no social activities or hobbies and tends to isolate with wife as his only support  Dimension 4:  Readiness to Change:  Dimension 4:  Description of Readiness to Change criteria: wife wants him to stop drinking and get treatment but James Ibarra, thus far, shows little or no motivation and minimizes the severity of his use and the impact on his health  Dimension 5:  Relapse, Continued use, or Continued Problem Potential:  Dimension 5:  Relapse, continued use, or continued problem potential critiera description: no relapse prevention skills  Dimension 6:   Recovery/Living Environment:  Dimension 6:  Recovery/Iiving environment criteria description: wife is supportive but lacks any other sober supports  ASAM Severity Score: ASAM's Severity Rating Score: 9  ASAM Recommended Level of Treatment:     Substance use Disorder (SUD) Substance Use Disorder (SUD)  Checklist Symptoms of Substance Use: Evidence of withdrawal (Comment), Evidence of tolerance, Continued use despite having a persistent/recurrent physical/psychological problem caused/exacerbated by use, Continued use despite persistent or recurrent social, interpersonal problems, caused or exacerbated by use, Persistent desire or unsuccessful efforts to cut down or control use, Recurrent use that results in a failure to fulfill major role obligations (work, school, home), Repeated use in physically hazardous situations, Large amounts of time spent to obtain, use or recover from the substance(s), Social, occupational, recreational activities given up or reduced due to use  Recommendations for Services/Supports/Treatments: Recommendations for Services/Supports/Treatments Recommendations For Services/Supports/Treatments: CD-IOP Intensive Chemical Dependency Program, Individual Therapy, Medication Management, Residential-Level 1  DSM5 Diagnoses: Patient Active Problem List   Diagnosis Date Noted   Malnutrition of moderate degree 09/05/2023   Subcapital fracture of neck of left femur, closed, initial encounter (HCC) 09/04/2023   Fall at home, initial encounter 09/04/2023   Left ankle injury, initial encounter 07/07/2023   Acute left ankle pain 07/07/2023   History of hypertension 07/07/2023   Hypercoagulable state due to paroxysmal atrial fibrillation (HCC) 06/21/2023   S/P MVR (mitral valve repair) 12/08/2022   Protein-calorie malnutrition, severe 08/19/2022   Alcohol withdrawal (HCC) 08/14/2022   Acute metabolic encephalopathy 08/14/2022   Paroxysmal atrial fibrillation with RVR (HCC) 08/14/2022    Nonrheumatic mitral valve regurgitation 08/14/2022   Mitral valve prolapse 08/14/2022   PNA (pneumonia) 08/12/2022   Severe sepsis (HCC) 08/12/2022   HLD (hyperlipidemia) 08/12/2022   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   Trigeminal neuralgia of left side of face 07/15/2019   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   Hypertension 10/31/2013   Pain in joint, shoulder region 04/14/2013   Nodule of right lung 11/07/2011    Patient Centered Plan: Patient is on the following Treatment Plan(s):  Substance Abuse   Referrals to Alternative Service(s): Referred to Alternative Service(s):   Place:   Date:   Time:    Referred to Alternative Service(s):   Place:   Date:   Time:    Referred to Alternative Service(s):   Place:   Date:   Time:    Referred to Alternative Service(s):   Place:  Date:   Time:      Collaboration of Care: Other wife attending sessions with Dan's permission  Patient/Guardian was advised Release of Information must be obtained prior to any record release in order to collaborate their care with an outside provider. Patient/Guardian was advised if they have not already done so to contact the registration department to sign all necessary forms in order for us  to release information regarding their care.   Consent: Patient/Guardian gives verbal consent for treatment and assignment of benefits for services provided during this visit. Patient/Guardian expressed understanding and agreed to proceed.   Plan: James Ibarra is to attend some AA meetings and abstain from alcohol    Zell Maier, MA, LCSW, [facility], LCAS 10/31/2023

## 2023-11-08 DIAGNOSIS — G909 Disorder of the autonomic nervous system, unspecified: Secondary | ICD-10-CM | POA: Diagnosis not present

## 2023-11-08 DIAGNOSIS — I4891 Unspecified atrial fibrillation: Secondary | ICD-10-CM | POA: Diagnosis not present

## 2023-11-08 DIAGNOSIS — F5101 Primary insomnia: Secondary | ICD-10-CM | POA: Diagnosis not present

## 2023-11-08 DIAGNOSIS — Z9181 History of falling: Secondary | ICD-10-CM | POA: Diagnosis not present

## 2023-11-08 DIAGNOSIS — I2510 Atherosclerotic heart disease of native coronary artery without angina pectoris: Secondary | ICD-10-CM | POA: Diagnosis not present

## 2023-11-08 DIAGNOSIS — I10 Essential (primary) hypertension: Secondary | ICD-10-CM | POA: Diagnosis not present

## 2023-11-08 DIAGNOSIS — Z604 Social exclusion and rejection: Secondary | ICD-10-CM | POA: Diagnosis not present

## 2023-11-15 ENCOUNTER — Encounter (HOSPITAL_COMMUNITY): Payer: Self-pay

## 2023-11-15 ENCOUNTER — Ambulatory Visit (HOSPITAL_COMMUNITY): Admitting: Licensed Clinical Social Worker

## 2023-11-16 DIAGNOSIS — S72012D Unspecified intracapsular fracture of left femur, subsequent encounter for closed fracture with routine healing: Secondary | ICD-10-CM | POA: Diagnosis not present

## 2023-11-16 DIAGNOSIS — Z9181 History of falling: Secondary | ICD-10-CM | POA: Diagnosis not present

## 2023-11-16 DIAGNOSIS — G909 Disorder of the autonomic nervous system, unspecified: Secondary | ICD-10-CM | POA: Diagnosis not present

## 2023-11-16 DIAGNOSIS — Z604 Social exclusion and rejection: Secondary | ICD-10-CM | POA: Diagnosis not present

## 2023-11-16 DIAGNOSIS — F5101 Primary insomnia: Secondary | ICD-10-CM | POA: Diagnosis not present

## 2023-11-16 DIAGNOSIS — I10 Essential (primary) hypertension: Secondary | ICD-10-CM | POA: Diagnosis not present

## 2023-11-16 DIAGNOSIS — I2510 Atherosclerotic heart disease of native coronary artery without angina pectoris: Secondary | ICD-10-CM | POA: Diagnosis not present

## 2023-12-24 DIAGNOSIS — I10 Essential (primary) hypertension: Secondary | ICD-10-CM | POA: Diagnosis not present

## 2023-12-24 DIAGNOSIS — Z8679 Personal history of other diseases of the circulatory system: Secondary | ICD-10-CM | POA: Diagnosis not present

## 2023-12-24 DIAGNOSIS — R253 Fasciculation: Secondary | ICD-10-CM | POA: Diagnosis not present

## 2023-12-24 DIAGNOSIS — G500 Trigeminal neuralgia: Secondary | ICD-10-CM | POA: Diagnosis not present

## 2023-12-24 DIAGNOSIS — Z23 Encounter for immunization: Secondary | ICD-10-CM | POA: Diagnosis not present

## 2023-12-24 DIAGNOSIS — E785 Hyperlipidemia, unspecified: Secondary | ICD-10-CM | POA: Diagnosis not present

## 2024-01-07 ENCOUNTER — Encounter: Payer: Self-pay | Admitting: Radiology

## 2024-01-14 ENCOUNTER — Ambulatory Visit: Admitting: Neurology

## 2024-01-14 ENCOUNTER — Encounter: Payer: Self-pay | Admitting: Neurology

## 2024-01-14 VITALS — BP 140/83 | HR 75 | Ht 73.0 in | Wt 160.0 lb

## 2024-01-14 DIAGNOSIS — M545 Low back pain, unspecified: Secondary | ICD-10-CM | POA: Insufficient documentation

## 2024-01-14 DIAGNOSIS — R269 Unspecified abnormalities of gait and mobility: Secondary | ICD-10-CM | POA: Diagnosis not present

## 2024-01-14 DIAGNOSIS — G8929 Other chronic pain: Secondary | ICD-10-CM

## 2024-01-14 NOTE — Progress Notes (Signed)
 Chief Complaint  Patient presents with   Follow-up    Pt in room 14. Alone. Here for hemifacial spasm follow up.      ASSESSMENT AND PLAN  James Ibarra is a 73 y.o. male   Slow Worsening gait abnormality, Chronic low back pain  Mild to moderate bilateral ankle dorsiflexion weakness right worse than left, length-dependent sensory changes  Most worrisome for lumbar radiculopathy  MRI of lumbar spine  Referred to physical therapy  DIAGNOSTIC DATA (LABS, IMAGING, TESTING) - I reviewed patient records, labs, notes, testing and imaging myself where available.   MEDICAL HISTORY:  James Ibarra is a 73 year old male, seen in request by primary care from [facility] Dr. Kip Righter for evaluation of slow worsening gait abnormality History is obtained from the patient and review of electronic medical records. I personally reviewed pertinent available imaging films in PACS.   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, had every 3 months low-dose Botox  injection, works well, last injection was in July 2023  He lost his dog few months ago, has become more sedentary, over the past few years, he noticed slow onset of mild unsteady gait, but does not fall yet, now with him become more sedentary, when he first get up, the balance issue seems to be more obvious, he has a long history of chronic low back pain, denies radiating pain, intermittent bilateral feet paresthesia, denies bowel and bladder incontinence  PHYSICAL EXAM:   Vitals:   01/14/24 1122  BP: (!) 140/83  Pulse: 75  SpO2: 99%  Weight: 160 lb (72.6 kg)  Height: 6' 1 (1.854 m)   Body mass index is 21.11 kg/m.  PHYSICAL EXAMNIATION:  Gen: NAD, conversant, well nourised, well groomed                     Cardiovascular: Regular rate rhythm, no peripheral edema, warm,  nontender. Eyes: Conjunctivae clear without exudates or hemorrhage Neck: Supple, no carotid bruits. Pulmonary: Clear to auscultation bilaterally   NEUROLOGICAL EXAM:  MENTAL STATUS: Speech/cognition: Awake, alert, oriented to history taking and casual conversation CRANIAL NERVES: CN II: Visual fields are full to confrontation. Pupils are round equal and briskly reactive to light. CN III, IV, VI: extraocular movement are normal. No ptosis. CN V: Facial sensation is intact to light touch CN VII: Face is symmetric with normal eye closure  CN VIII: Hearing is normal to causal conversation. CN IX, X: Phonation is normal. CN XI: Head turning and shoulder shrug are intact  MOTOR: Mild right more than left bilateral ankle dorsiflexion weakness,  REFLEXES: Reflexes are 1 and symmetric at the biceps, triceps, knees, and absent at ankles. Plantar responses are flexor.  SENSORY: Length-dependent decreased light touch, pinprick to ankle level, vibratory sensation to midshin level  COORDINATION: There is no trunk or limb dysmetria noted.  GAIT/STANCE: Push-up, bilateral foot drop, worse on the right, could not stand up on heels,  REVIEW OF SYSTEMS:  Full 14 system review of systems performed and notable only for as above All other review of systems were negative.   ALLERGIES: No Known Allergies  HOME MEDICATIONS: Current Outpatient Medications  Medication Sig Dispense Refill   acetaminophen  (TYLENOL ) 325 MG tablet Take 2 tablets (650 mg total) by mouth every 6 (six) hours. (Patient taking differently: Take 650 mg by mouth every 6 (  six) hours as needed for mild pain (pain score 1-3).)     Ascorbic Acid (VITAMIN C PO) Take 750 mg by mouth daily.     aspirin  EC 81 MG tablet Take 1 tablet (81 mg total) by mouth daily. Swallow whole.     Cholecalciferol (VITAMIN D) 50 MCG (2000 UT) tablet Take 2,000 Units by mouth daily.     fluorouracil (EFUDEX) 5 % cream Apply 1 Application topically 2  (two) times daily.     folic acid  (FOLVITE ) 1 MG tablet Take 1 mg by mouth daily.     gabapentin  (NEURONTIN ) 300 MG capsule TAKE 2 CAPSULES BY MOUTH 4 TIMES DAILY 480 capsule 0   HYDROcodone -acetaminophen  (NORCO/VICODIN) 5-325 MG tablet Take 1 tablet by mouth every 6 (six) hours as needed for moderate pain (pain score 4-6). 30 tablet 0   hydrocortisone 2.5 % ointment Apply 1 Application topically 2 (two) times daily as needed (itching).     Magnesium  400 MG TABS Take 400 mg by mouth daily.     melatonin 3 MG TABS tablet Take 3 mg by mouth at bedtime as needed (sleep).     metoprolol  tartrate (LOPRESSOR ) 50 MG tablet Take 1 tablet (50 mg total) by mouth 2 (two) times daily. 180 tablet 3   Multiple Vitamins-Minerals (PRESERVISION AREDS 2) CAPS Take 1 capsule by mouth 2 (two) times daily.     Polyethyl Glycol-Propyl Glycol (SYSTANE OP) Place 1 drop into both eyes daily as needed (dry eyes).     rosuvastatin  (CRESTOR ) 20 MG tablet Take 1 tablet (20 mg total) by mouth daily. 90 tablet 3   sildenafil (REVATIO) 20 MG tablet Take 40-60 mg by mouth daily as needed (ED).     telmisartan  (MICARDIS ) 40 MG tablet Take 1 tablet (40 mg total) by mouth daily. 90 tablet 3   thiamine  (VITAMIN B-1) 100 MG tablet Take 100 mg by mouth daily.     traZODone  (DESYREL ) 100 MG tablet Take 100 mg by mouth at bedtime.     enoxaparin  (LOVENOX ) 40 MG/0.4ML injection Inject 0.4 mLs (40 mg total) into the skin daily for 14 days. (Patient not taking: Reported on 01/14/2024) 5.6 mL 0   No current facility-administered medications for this visit.    PAST MEDICAL HISTORY: Past Medical History:  Diagnosis Date   Atrial fibrillation (HCC)    Cancer (HCC)    Skin Cancer - Scalp, Right neck, Left Hand   Dysrhythmia    A. Fib while hospitalized June 2024   Heart murmur    History of nonmelanoma skin cancer    Hypercholesteremia    Hypertension    Pneumonia 08/2022   Trigeminal neuralgia     PAST SURGICAL HISTORY: Past  Surgical History:  Procedure Laterality Date   ATRIAL FIBRILLATION ABLATION N/A 05/24/2023   Procedure: ATRIAL FIBRILLATION ABLATION;  Surgeon: Nancey Eulas BRAVO, MD;  Location: [facility];  Service: Cardiovascular;  Laterality: N/A;   CATARACT EXTRACTION W/ INTRAOCULAR LENS IMPLANT Bilateral    CLIPPING OF ATRIAL APPENDAGE Left 12/08/2022   Procedure: CLIPPING OF ATRIAL APPENDAGE (LAA) USING 50 MM ATRICLIP;  Surgeon: Maryjane Mt, MD;  Location: [facility] OR;  Service: Open Heart Surgery;  Laterality: Left;   INGUINAL HERNIA REPAIR  11/07/2011   Procedure: LAPAROSCOPIC INGUINAL HERNIA;  Surgeon: Camellia CHRISTELLA Blush, MD,FACS;  Location: [facility];  Service: General;  Laterality: Left;   MAZE N/A 12/08/2022   Procedure: MAZE;  Surgeon: Maryjane Mt, MD;  Location: [facility] OR;  Service:  Open Heart Surgery;  Laterality: N/A;   MITRAL VALVE REPAIR N/A 12/08/2022   Procedure: MITRAL VALVE REPAIR (MVR) USING 34 MM SIMULUS SEMI- RIGID ANNULOPLASTY BAND;  Surgeon: Maryjane Mt, MD;  Location: [facility] OR;  Service: Open Heart Surgery;  Laterality: N/A;   RETINAL DETACHMENT SURGERY  03/07/1995   RIGHT/LEFT HEART CATH AND CORONARY ANGIOGRAPHY N/A 10/12/2022   Procedure: RIGHT/LEFT HEART CATH AND CORONARY ANGIOGRAPHY;  Surgeon: Wendel Lurena POUR, MD;  Location: [facility];  Service: Cardiovascular;  Laterality: N/A;   SKIN CANCER EXCISION  2012, 1999   basal cell - neck and hand   TEE WITHOUT CARDIOVERSION N/A 10/16/2022   Procedure: TRANSESOPHAGEAL ECHOCARDIOGRAM;  Surgeon: Santo Stanly LABOR, MD;  Location: [facility];  Service: Cardiovascular;  Laterality: N/A;   TEE WITHOUT CARDIOVERSION N/A 12/08/2022   Procedure: TRANSESOPHAGEAL ECHOCARDIOGRAM;  Surgeon: Maryjane Mt, MD;  Location: [facility] OR;  Service: Open Heart Surgery;  Laterality: N/A;   TOTAL HIP ARTHROPLASTY Left 09/05/2023   Procedure: ARTHROPLASTY, HIP, TOTAL, ANTERIOR APPROACH;  Surgeon: Jerri Kay HERO, MD;  Location: [facility] OR;  Service:  Orthopedics;  Laterality: Left;    FAMILY HISTORY: Family History  Problem Relation Age of Onset   Heart disease Father    Heart disease Mother     SOCIAL HISTORY: Social History   Socioeconomic History   Marital status: Married    Spouse name: Not on file   Number of children: Not on file   Years of education: Not on file   Highest education level: Not on file  Occupational History   Not on file  Tobacco Use   Smoking status: Never   Smokeless tobacco: Never   Tobacco comments:    Never smoked 06/21/23  Vaping Use   Vaping status: Never Used  Substance and Sexual Activity   Alcohol use: Not Currently   Drug use: No   Sexual activity: Not on file  Other Topics Concern   Not on file  Social History Narrative   Not on file   Social Drivers of Health   Financial Resource Strain: Not on file  Food Insecurity: No Food Insecurity (09/06/2023)   Hunger Vital Sign    Worried About Running Out of Food in the Last Year: Never true    Ran Out of Food in the Last Year: Never true  Transportation Needs: Patient Unable To Answer (09/06/2023)   PRAPARE - Transportation    Lack of Transportation (Medical): Patient unable to answer    Lack of Transportation (Non-Medical): Patient unable to answer  Physical Activity: Not on file  Stress: Not on file  Social Connections: Unknown (09/06/2023)   Social Connection and Isolation Panel    Frequency of Communication with Friends and Family: Patient unable to answer    Frequency of Social Gatherings with Friends and Family: Patient unable to answer    Attends Religious Services: Patient unable to answer    Active Member of Clubs or Organizations: Patient unable to answer    Attends Banker Meetings: Patient unable to answer    Marital Status: Married  Catering Manager Violence: Patient Unable To Answer (09/06/2023)   Humiliation, Afraid, Rape, and Kick questionnaire    Fear of Current or Ex-Partner: Patient unable to answer     Emotionally Abused: Patient unable to answer    Physically Abused: Patient unable to answer    Sexually Abused: Patient unable to answer      Modena Callander, M.D. Ph.D.  [facility] [address] [place], [place] [address] Ph: [phone] Fax: [phone]  CC:  Kip Righter, MD [address] [place],  [place] [address]  Kip Righter, MD

## 2024-01-15 ENCOUNTER — Telehealth: Payer: Self-pay | Admitting: Neurology

## 2024-01-15 NOTE — Telephone Encounter (Signed)
 no auth required sent to [facility] [phone]

## 2024-01-29 ENCOUNTER — Ambulatory Visit
Admission: RE | Admit: 2024-01-29 | Discharge: 2024-01-29 | Disposition: A | Source: Ambulatory Visit | Attending: Neurology | Admitting: Neurology

## 2024-01-29 ENCOUNTER — Ambulatory Visit: Payer: Self-pay | Admitting: Neurology

## 2024-01-29 DIAGNOSIS — G8929 Other chronic pain: Secondary | ICD-10-CM

## 2024-01-29 DIAGNOSIS — R269 Unspecified abnormalities of gait and mobility: Secondary | ICD-10-CM

## 2024-02-03 ENCOUNTER — Other Ambulatory Visit: Payer: Self-pay

## 2024-02-03 ENCOUNTER — Emergency Department (HOSPITAL_COMMUNITY)

## 2024-02-03 ENCOUNTER — Encounter (HOSPITAL_COMMUNITY): Payer: Self-pay

## 2024-02-03 ENCOUNTER — Observation Stay (HOSPITAL_COMMUNITY)
Admission: EM | Admit: 2024-02-03 | Discharge: 2024-02-05 | DRG: 071 | Disposition: A | Attending: Internal Medicine | Admitting: Internal Medicine

## 2024-02-03 DIAGNOSIS — G9340 Encephalopathy, unspecified: Secondary | ICD-10-CM | POA: Diagnosis present

## 2024-02-03 DIAGNOSIS — R4781 Slurred speech: Principal | ICD-10-CM

## 2024-02-03 DIAGNOSIS — E876 Hypokalemia: Secondary | ICD-10-CM | POA: Diagnosis not present

## 2024-02-03 DIAGNOSIS — I639 Cerebral infarction, unspecified: Secondary | ICD-10-CM

## 2024-02-03 DIAGNOSIS — Z8679 Personal history of other diseases of the circulatory system: Secondary | ICD-10-CM | POA: Diagnosis not present

## 2024-02-03 DIAGNOSIS — R29705 NIHSS score 5: Secondary | ICD-10-CM

## 2024-02-03 DIAGNOSIS — E785 Hyperlipidemia, unspecified: Secondary | ICD-10-CM | POA: Diagnosis present

## 2024-02-03 DIAGNOSIS — R569 Unspecified convulsions: Secondary | ICD-10-CM | POA: Diagnosis not present

## 2024-02-03 DIAGNOSIS — R410 Disorientation, unspecified: Secondary | ICD-10-CM

## 2024-02-03 DIAGNOSIS — G8384 Todd's paralysis (postepileptic): Secondary | ICD-10-CM

## 2024-02-03 DIAGNOSIS — I480 Paroxysmal atrial fibrillation: Secondary | ICD-10-CM | POA: Diagnosis present

## 2024-02-03 DIAGNOSIS — E782 Mixed hyperlipidemia: Secondary | ICD-10-CM | POA: Diagnosis not present

## 2024-02-03 LAB — COMPREHENSIVE METABOLIC PANEL WITH GFR
ALT: 16 U/L (ref 0–44)
AST: 21 U/L (ref 15–41)
Albumin: 3.5 g/dL (ref 3.5–5.0)
Alkaline Phosphatase: 53 U/L (ref 38–126)
Anion gap: 10 (ref 5–15)
BUN: 12 mg/dL (ref 8–23)
CO2: 26 mmol/L (ref 22–32)
Calcium: 8.8 mg/dL — ABNORMAL LOW (ref 8.9–10.3)
Chloride: 103 mmol/L (ref 98–111)
Creatinine, Ser: 0.72 mg/dL (ref 0.61–1.24)
GFR, Estimated: >60 mL/min (ref >60)
Glucose, Bld: 106 mg/dL — ABNORMAL HIGH (ref 70–99)
Potassium: 3.3 mmol/L — ABNORMAL LOW (ref 3.5–5.1)
Sodium: 139 mmol/L (ref 135–145)
Total Bilirubin: 1.5 mg/dL — ABNORMAL HIGH (ref 0.0–1.2)
Total Protein: 6.2 g/dL — ABNORMAL LOW (ref 6.5–8.1)

## 2024-02-03 LAB — CBC
HCT: 43.4 % (ref 39.0–52.0)
Hemoglobin: 14.4 g/dL (ref 13.0–17.0)
MCH: 29.4 pg (ref 26.0–34.0)
MCHC: 33.2 g/dL (ref 30.0–36.0)
MCV: 88.8 fL (ref 80.0–100.0)
Platelets: 174 K/uL (ref 150–400)
RBC: 4.89 MIL/uL (ref 4.22–5.81)
RDW: 14.5 % (ref 11.5–15.5)
WBC: 10.8 K/uL — ABNORMAL HIGH (ref 4.0–10.5)
nRBC: 0 % (ref 0.0–0.2)

## 2024-02-03 LAB — PROTIME-INR
INR: 1.1 (ref 0.8–1.2)
Prothrombin Time: 14.7 s (ref 11.4–15.2)

## 2024-02-03 LAB — DIFFERENTIAL
Abs Immature Granulocytes: 0.04 K/uL (ref 0.00–0.07)
Basophils Absolute: 0 K/uL (ref 0.0–0.1)
Basophils Relative: 0 %
Eosinophils Absolute: 0.1 K/uL (ref 0.0–0.5)
Eosinophils Relative: 1 %
Immature Granulocytes: 0 %
Lymphocytes Relative: 5 %
Lymphs Abs: 0.6 K/uL — ABNORMAL LOW (ref 0.7–4.0)
Monocytes Absolute: 0.4 K/uL (ref 0.1–1.0)
Monocytes Relative: 4 %
Neutro Abs: 9.7 K/uL — ABNORMAL HIGH (ref 1.7–7.7)
Neutrophils Relative %: 90 %

## 2024-02-03 LAB — I-STAT CHEM 8, ED
BUN: 13 mg/dL (ref 8–23)
Calcium, Ion: 1.1 mmol/L — ABNORMAL LOW (ref 1.15–1.40)
Chloride: 100 mmol/L (ref 98–111)
Creatinine, Ser: 0.7 mg/dL (ref 0.61–1.24)
Glucose, Bld: 105 mg/dL — ABNORMAL HIGH (ref 70–99)
HCT: 44 % (ref 39.0–52.0)
Hemoglobin: 15 g/dL (ref 13.0–17.0)
Potassium: 3.4 mmol/L — ABNORMAL LOW (ref 3.5–5.1)
Sodium: 141 mmol/L (ref 135–145)
TCO2: 26 mmol/L (ref 22–32)

## 2024-02-03 LAB — APTT: aPTT: 29 s (ref 24–36)

## 2024-02-03 LAB — CBG MONITORING, ED: Glucose-Capillary: 102 mg/dL — ABNORMAL HIGH (ref 70–99)

## 2024-02-03 LAB — ETHANOL: Alcohol, Ethyl (B): <15 mg/dL (ref <15)

## 2024-02-03 LAB — MAGNESIUM: Magnesium: 1.8 mg/dL (ref 1.7–2.4)

## 2024-02-03 MED ORDER — ACETAMINOPHEN 650 MG RE SUPP: 650.0000 mg | Freq: Four times a day (QID) | RECTAL | Status: DC | PRN

## 2024-02-03 MED ORDER — MELATONIN 3 MG PO TABS
3.0000 mg | ORAL_TABLET | Freq: Every evening | ORAL | Status: DC | PRN
  Administered 2024-02-04: 3 mg via ORAL
  Filled 2024-02-03: qty 1

## 2024-02-03 MED ORDER — SODIUM CHLORIDE 0.9% FLUSH
3.0000 mL | Freq: Once | INTRAVENOUS | Status: AC
  Administered 2024-02-03: 3 mL via INTRAVENOUS

## 2024-02-03 MED ORDER — ONDANSETRON HCL 4 MG/2ML IJ SOLN: 4.0000 mg | Freq: Four times a day (QID) | INTRAMUSCULAR | Status: DC | PRN

## 2024-02-03 MED ORDER — POTASSIUM CHLORIDE CRYS ER 20 MEQ PO TBCR
40.0000 meq | EXTENDED_RELEASE_TABLET | Freq: Once | ORAL | Status: AC
  Administered 2024-02-03: 40 meq via ORAL
  Filled 2024-02-03: qty 2

## 2024-02-03 MED ORDER — IOHEXOL 350 MG/ML SOLN
75.0000 mL | Freq: Once | INTRAVENOUS | Status: AC | PRN
  Administered 2024-02-03: 100 mL via INTRAVENOUS

## 2024-02-03 MED ORDER — ACETAMINOPHEN 325 MG PO TABS: 650.0000 mg | ORAL_TABLET | Freq: Four times a day (QID) | ORAL | Status: DC | PRN

## 2024-02-03 NOTE — ED Triage Notes (Signed)
 Pt coming in from home via [facility]. Pt last known well was 5pm. Pt was slow to respond at 630 pm for his wife. Then he had left sided coordination deficits. Pt normally has an unsteady gait but it was worse today. Pt has a history of alcohol use but denies use today.

## 2024-02-03 NOTE — H&P (Signed)
 History and Physical      James Ibarra FMW:[id number] DOB: 05/18/1950 DOA: 02/03/2024; DOS: 02/03/2024  PCP: Kip Righter, MD *** Patient coming from: home ***  I have personally briefly reviewed patient's old medical records in [facility]  Chief Complaint: ***  HPI: James Ibarra is a 73 y.o. male with medical history significant for *** who is admitted to [facility] on 02/03/2024 with *** after presenting from home*** to [facility] ED complaining of ***.    ***       ***   ED Course:  Vital signs in the ED were notable for the following: ***  Labs were notable for the following: ***  Per my interpretation, EKG in ED demonstrated the following:  ***  Imaging in the ED, per corresponding formal radiology read, was notable for the following:  ***  While in the ED, the following were administered: ***  Subsequently, the patient was admitted  ***  ***red    Review of Systems: As per HPI otherwise 10 point review of systems negative.   Past Medical History:  Diagnosis Date   Atrial fibrillation (HCC)    Cancer (HCC)    Skin Cancer - Scalp, Right neck, Left Hand   Dysrhythmia    A. Fib while hospitalized June 2024   Heart murmur    History of nonmelanoma skin cancer    Hypercholesteremia    Hypertension    Pneumonia 08/2022   Trigeminal neuralgia     Past Surgical History:  Procedure Laterality Date   ATRIAL FIBRILLATION ABLATION N/A 05/24/2023   Procedure: ATRIAL FIBRILLATION ABLATION;  Surgeon: Nancey Eulas BRAVO, MD;  Location: [facility] INVASIVE CV LAB;  Service: Cardiovascular;  Laterality: N/A;   CATARACT EXTRACTION W/ INTRAOCULAR LENS IMPLANT Bilateral    CLIPPING OF ATRIAL APPENDAGE Left 12/08/2022   Procedure: CLIPPING OF ATRIAL APPENDAGE (LAA) USING 50 MM ATRICLIP;  Surgeon: Maryjane Mt, MD;  Location: [facility] OR;  Service: Open Heart Surgery;  Laterality: Left;   INGUINAL HERNIA REPAIR  11/07/2011   Procedure: LAPAROSCOPIC INGUINAL HERNIA;   Surgeon: Camellia CHRISTELLA Blush, MD,FACS;  Location: [facility] ORS;  Service: General;  Laterality: Left;   MAZE N/A 12/08/2022   Procedure: MAZE;  Surgeon: Maryjane Mt, MD;  Location: [facility] OR;  Service: Open Heart Surgery;  Laterality: N/A;   MITRAL VALVE REPAIR N/A 12/08/2022   Procedure: MITRAL VALVE REPAIR (MVR) USING 34 MM SIMULUS SEMI- RIGID ANNULOPLASTY BAND;  Surgeon: Maryjane Mt, MD;  Location: [facility] OR;  Service: Open Heart Surgery;  Laterality: N/A;   RETINAL DETACHMENT SURGERY  03/07/1995   RIGHT/LEFT HEART CATH AND CORONARY ANGIOGRAPHY N/A 10/12/2022   Procedure: RIGHT/LEFT HEART CATH AND CORONARY ANGIOGRAPHY;  Surgeon: Wendel Lurena POUR, MD;  Location: [facility] INV[facility] CV LAB;  Service: Cardiovascular;  Laterality: N/A;   SKIN CANCER EXCISION  2012, 1999   basal cell - neck and hand   TEE WITHOUT CARDIOVERSION N/A 10/16/2022   Procedure: TRANSESOPHAGEAL ECHOCARDIOGRAM;  Surgeon: Santo Stanly LABOR, MD;  Location: [facility]VAS[facility] CV LAB;  Service: Cardiovascular;  Laterality: N/A;   TEE WITHOUT CARDIOVERSION N/A 12/08/2022   Procedure: TRANSESOPHAGEAL ECHOCARDIOGRAM;  Surgeon: Maryjane Mt, MD;  Location: [facility] OR;  Service: Open Heart Surgery;  Laterality: N/A;   TOTAL HIP ARTHROPLASTY Left 09/05/2023   Procedure: ARTHROPLASTY, HIP, TOTAL, ANTERIOR APPROACH;  Surgeon: Jerri Kay CHRISTELLA, MD;  Location: [facility] OR;  Service: Orthopedics;  Laterality: Left;    Social History:  reports that he has never smoked.  He has never used smokeless tobacco. He reports that he does not currently use alcohol. He reports that he does not use drugs.   No Known Allergies  Family History  Problem Relation Age of Onset   Heart disease Father    Heart disease Mother     Family history reviewed and not pertinent ***   Prior to Admission medications   Medication Sig Start Date End Date Taking? Authorizing Provider  acetaminophen  (TYLENOL ) 325 MG tablet Take 2 tablets (650 mg total) by mouth every 6 (six) hours. Patient taking  differently: Take 650 mg by mouth every 6 (six) hours as needed for mild pain (pain score 1-3). 12/14/22   Roddenberry, Myron G, PA-C  Ascorbic Acid (VITAMIN C PO) Take 750 mg by mouth daily.    [provider]  aspirin  EC 81 MG tablet Take 1 tablet (81 mg total) by mouth daily. Swallow whole. 09/11/23   Odell Celinda Balo, MD  Cholecalciferol (VITAMIN D) 50 MCG (2000 UT) tablet Take 2,000 Units by mouth daily.    [provider]  enoxaparin  (LOVENOX ) 40 MG/0.4ML injection Inject 0.4 mLs (40 mg total) into the skin daily for 14 days. Patient not taking: Reported on 01/14/2024 09/06/23 09/20/23  Jule Ronal CROME, PA-C  fluorouracil (EFUDEX) 5 % cream Apply 1 Application topically 2 (two) times daily. 08/24/23   [provider]  folic acid  (FOLVITE ) 1 MG tablet Take 1 mg by mouth daily.    [provider]  gabapentin  (NEURONTIN ) 300 MG capsule TAKE 2 CAPSULES BY MOUTH 4 TIMES DAILY 09/25/23   Onita Duos, MD  HYDROcodone -acetaminophen  (NORCO/VICODIN) 5-325 MG tablet Take 1 tablet by mouth every 6 (six) hours as needed for moderate pain (pain score 4-6). 09/06/23   Jule Ronal CROME, PA-C  hydrocortisone 2.5 % ointment Apply 1 Application topically 2 (two) times daily as needed (itching).    [provider]  Magnesium  400 MG TABS Take 400 mg by mouth daily.    [provider]  melatonin 3 MG TABS tablet Take 3 mg by mouth at bedtime as needed (sleep).    [provider]  metoprolol  tartrate (LOPRESSOR ) 50 MG tablet Take 1 tablet (50 mg total) by mouth 2 (two) times daily. 05/14/23   Wyn Jackee VEAR Mickey., NP  Multiple Vitamins-Minerals (PRESERVISION AREDS 2) CAPS Take 1 capsule by mouth 2 (two) times daily.    [provider]  Polyethyl Glycol-Propyl Glycol (SYSTANE OP) Place 1 drop into both eyes daily as needed (dry eyes).    [provider]  rosuvastatin  (CRESTOR ) 20 MG tablet Take 1 tablet (20 mg total) by mouth daily. 08/30/23    Thukkani, Arun K, MD  sildenafil (REVATIO) 20 MG tablet Take 40-60 mg by mouth daily as needed (ED).    [provider]  telmisartan  (MICARDIS ) 40 MG tablet Take 1 tablet (40 mg total) by mouth daily. 08/30/23   Thukkani, Arun K, MD  thiamine  (VITAMIN B-1) 100 MG tablet Take 100 mg by mouth daily.    [provider]  traZODone  (DESYREL ) 100 MG tablet Take 100 mg by mouth at bedtime.    [provider]     Objective    Physical Exam: Vitals:   02/03/24 2238 02/03/24 2244  BP: (!) 179/90   Pulse: (!) 57   Resp: 18   Temp: 100.3 F (37.9 C)   TempSrc: Oral   SpO2: 99%   Weight:  72 kg  Height:  6' 1 (1.854  m)    General: appears to be stated age; alert, oriented Skin: warm, dry, no rash Head:  AT/ Mouth:  Oral mucosa membranes appear moist, normal dentition Neck: supple; trachea midline Heart:  RRR; did not appreciate any M/R/G Lungs: CTAB, did not appreciate any wheezes, rales, or rhonchi Abdomen: + BS; soft, ND, NT Vascular: 2+ pedal pulses b/l; 2+ radial pulses b/l Extremities: no peripheral edema, no muscle wasting   ***   *** Neuro: strength and sensation intact in upper and lower extremities b/l  *** Neuro: 5/5 strength of the proximal and distal flexors and extensors of the upper and lower extremities bilaterally; sensation intact in upper and lower extremities b/l; cranial nerves II through XII grossly intact; no pronator drift; no evidence suggestive of slurred speech, dysarthria, or facial droop; Normal muscle tone. No tremors. *** Neuro: In the setting of the patient's current mental status and associated inability to follow instructions, unable to perform full neurologic exam at this time.  As such, assessment of strength, sensation, and cranial nerves is limited at this time. Patient noted to spontaneously move all 4 extremities. No tremors.  ***        Labs on Admission: I have personally reviewed following labs and imaging  studies  CBC: Recent Labs  Lab 02/03/24 2155 02/03/24 2203  WBC 10.8*  --   NEUTROABS 9.7*  --   HGB 14.4 15.0  HCT 43.4 44.0  MCV 88.8  --   PLT 174  --    Basic Metabolic Panel: Recent Labs  Lab 02/03/24 2155 02/03/24 2203  NA 139 141  K 3.3* 3.4*  CL 103 100  CO2 26  --   GLUCOSE 106* 105*  BUN 12 13  CREATININE 0.72 0.70  CALCIUM  8.8*  --    GFR: Estimated Creatinine Clearance: 83.8 mL/min (by C-G formula based on SCr of 0.7 mg/dL). Liver Function Tests: Recent Labs  Lab 02/03/24 2155  AST 21  ALT 16  ALKPHOS 53  BILITOT 1.5*  PROT 6.2*  ALBUMIN  3.5   No results for input(s): LIPASE, AMYLASE in the last 168 hours. No results for input(s): AMMONIA in the last 168 hours. Coagulation Profile: Recent Labs  Lab 02/03/24 2231  INR 1.1   Cardiac Enzymes: No results for input(s): CKTOTAL, CKMB, CKMBINDEX, TROPONINI in the last 168 hours. BNP (last 3 results) No results for input(s): PROBNP in the last 8760 hours. HbA1C: No results for input(s): HGBA1C in the last 72 hours. CBG: Recent Labs  Lab 02/03/24 2154  GLUCAP 102*   Lipid Profile: No results for input(s): CHOL, HDL, LDLCALC, TRIG, CHOLHDL, LDLDIRECT in the last 72 hours. Thyroid  Function Tests: No results for input(s): TSH, T4TOTAL, FREET4, T3FREE, THYROIDAB in the last 72 hours. Anemia Panel: No results for input(s): VITAMINB12, FOLATE, FERRITIN, TIBC, IRON , RETICCTPCT in the last 72 hours. Urine analysis:    Component Value Date/Time   COLORURINE YELLOW 09/04/2023 2333   APPEARANCEUR CLEAR 09/04/2023 2333   LABSPEC 1.039 (H) 09/04/2023 2333   PHURINE 6.0 09/04/2023 2333   GLUCOSEU NEGATIVE 09/04/2023 2333   HGBUR SMALL (A) 09/04/2023 2333   BILIRUBINUR NEGATIVE 09/04/2023 2333   KETONESUR NEGATIVE 09/04/2023 2333   PROTEINUR 100 (A) 09/04/2023 2333   NITRITE NEGATIVE 09/04/2023 2333   LEUKOCYTESUR NEGATIVE 09/04/2023 2333     Radiological Exams on Admission: CT ANGIO HEAD NECK W WO CM W PERF (CODE STROKE) Result Date: 02/03/2024 CLINICAL DATA:  Initial evaluation for acute neuro deficit, stroke suspected.  EXAM: CT ANGIOGRAPHY HEAD AND NECK CT PERFUSION BRAIN TECHNIQUE: Multidetector CT imaging of the head and neck was performed using the standard protocol during bolus administration of intravenous contrast. Multiplanar CT image reconstructions and MIPs were obtained to evaluate the vascular anatomy. Carotid stenosis measurements (when applicable) are obtained utilizing NASCET criteria, using the distal internal carotid diameter as the denominator. Multiphase CT imaging of the brain was performed following IV bolus contrast injection. Subsequent parametric perfusion maps were calculated using RAPID software. RADIATION DOSE REDUCTION: This exam was performed according to the departmental dose-optimization program which includes automated exposure control, adjustment of the mA and/or kV according to patient size and/or use of iterative reconstruction technique. CONTRAST:  OMNIPAQUE  IOHEXOL  350 MG/ML SOLN COMPARISON:  CT from earlier the same day. FINDINGS: CTA NECK FINDINGS Aortic arch: Visualized aortic arch within normal limits for caliber with standard 3 vessel morphology. Mild aortic atherosclerosis. No stenosis about the origin the great vessels. Right carotid system: Right common and internal carotid arteries are tortuous and ectatic but patent without dissection or stenosis. Left carotid system: Left common and internal carotid arteries are tortuous and ectatic but patent without stenosis or dissection. Vertebral arteries: Both vertebral arteries somewhat tortuous and ectatic but patent without stenosis or dissection. Skeleton: No worrisome osseous lesions.  Prior sternotomy noted. Other neck: No other acute finding. Upper chest: Small layering right pleural effusion noted. Associated mild atelectasis within the  visualized right lower lobe. No other acute finding. Review of the MIP images confirms the above findings CTA HEAD FINDINGS Anterior circulation: Both internal carotid arteries widely patent to the termini without stenosis. A1 segments widely patent. Normal anterior communicating artery complex. Both anterior cerebral arteries widely patent to their distal aspects without stenosis. No M1 stenosis or occlusion. Normal MCA bifurcations. Distal MCA branches well perfused and symmetric. Posterior circulation: Both V4 segments patent to the vertebrobasilar junction without stenosis. Both PICA origins patent and normal. Basilar widely patent to its distal aspect without stenosis. Superior cerebellar arteries patent bilaterally. Both PCAs primarily supplied via the basilar and are well perfused to there distal aspects. Venous sinuses: Patent allowing for timing the contrast bolus. Anatomic variants: None significant.  No aneurysm. Review of the MIP images confirms the above findings CT Brain Perfusion Findings: ASPECTS: 10 CBF (<30%) Volume: 0mL Perfusion (Tmax>6.0s) volume: 0mL Mismatch Volume: 0mL Infarction Location:Negative CT perfusion with no evidence for acute ischemia or other perfusion abnormality. IMPRESSION: 1. Negative CTA of the head and neck. No large vessel occlusion or other emergent finding. 2. Negative CT perfusion with no evidence for acute ischemia or other perfusion abnormality. 3. Diffuse tortuosity and ectasia of the major arterial vasculature of the head and neck, suggesting chronic underlying hypertension. 4. Small layering right pleural effusion. Aortic Atherosclerosis (ICD10-I70.0). These results were communicated to Dr. Arora at 10:22 pm on 02/03/2024 by text page via the [facility] messaging system. Electronically Signed   By: Morene Hoard M.D.   On: 02/03/2024 22:27   CT HEAD CODE STROKE WO CONTRAST Result Date: 02/03/2024 CLINICAL DATA:  Code stroke. Initial evaluation for acute neuro  deficit, stroke suspected, left-sided weakness. EXAM: CT HEAD WITHOUT CONTRAST TECHNIQUE: Contiguous axial images were obtained from the base of the skull through the vertex without intravenous contrast. RADIATION DOSE REDUCTION: This exam was performed according to the departmental dose-optimization program which includes automated exposure control, adjustment of the mA and/or kV according to patient size and/or use of iterative reconstruction technique. COMPARISON:  Prior study from  09/06/2023. FINDINGS: Brain: Cerebral volume within normal limits. Mild to moderate chronic microvascular ischemic disease. No acute intracranial hemorrhage. No acute large vessel territory infarct. No mass lesion or midline shift. No hydrocephalus or extra-axial fluid collection. 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 Vascular: No abnormal hyperdense vessel. Calcified atherosclerosis present at skull base. Skull: Scalp soft tissues within normal limits.  Calvarium intact. Sinuses/Orbits: Globes orbital soft tissues demonstrate no acute finding. Prior scleral buckle on the left. Paranasal sinuses and mastoid air cells are largely clear. Other: None. ASPECTS [facility] Stroke Program Early CT Score) - Ganglionic level infarction (caudate, lentiform nuclei, internal capsule, insula, M1-M3 cortex): 7 - Supraganglionic infarction (M4-M6 cortex): 3 Total score (0-10 with 10 being normal): 10 IMPRESSION: 1. No acute intracranial abnormality. 2. ASPECTS is 10. 3. Mild-to-moderate chronic microvascular ischemic disease. These results were communicated to Dr. Arora at 10:10 pm on 02/03/2024 by text page via the [facility] messaging system. Electronically Signed   By: Morene Hoard M.D.   On: 02/03/2024 22:12      Assessment/Plan   Principal Problem:   Acute  encephalopathy   ***            ***                     ***                      ***                     ***                     ***                     ***                      ***                     ***                     ***                     ***                     ***                    ***                   ***  DVT prophylaxis: SCD's ***  Code Status: Full code*** Family Communication: none*** Disposition Plan: Per Rounding Team Consults called: none***;  Admission status: ***     I SPENT GREATER THAN 75 *** MINUTES IN CLINICAL CARE TIME/MEDICAL DECISION-MAKING IN COMPLETING THIS ADMISSION.      Eva NOVAK Kaelene Elliston DO [facility]  From 7PM - 7AM   02/03/2024, 11:17 PM   ***

## 2024-02-03 NOTE — ED Provider Notes (Signed)
 [place] [facility] AT [facility] Provider Note   CSN: [id number] Arrival date & time: 02/03/24  2152     Patient presents with: Code Stroke   James Ibarra is a 73 y.o. male presented to ED with concern for acute onset of confusion, slow speech, discoordination, last seen well around 5 PM by his wife this evening.  Patient having left-sided discoordination and ataxia per EMS.  He arrives as code stroke  Patient does have a history of alcohol use disorder.  He is having difficult time telling me whether he has recently consumed alcohol.  He seems quite distractible on exam   HPI     Prior to Admission medications   Medication Sig Start Date End Date Taking? Authorizing Provider  acetaminophen  (TYLENOL ) 325 MG tablet Take 2 tablets (650 mg total) by mouth every 6 (six) hours. Patient taking differently: Take 650 mg by mouth every 6 (six) hours as needed for mild pain (pain score 1-3). 12/14/22   Roddenberry, Myron G, PA-C  Ascorbic Acid (VITAMIN C PO) Take 750 mg by mouth daily.    [provider]  aspirin  EC 81 MG tablet Take 1 tablet (81 mg total) by mouth daily. Swallow whole. 09/11/23   Odell Celinda Balo, MD  Cholecalciferol (VITAMIN D) 50 MCG (2000 UT) tablet Take 2,000 Units by mouth daily.    [provider]  enoxaparin  (LOVENOX ) 40 MG/0.4ML injection Inject 0.4 mLs (40 mg total) into the skin daily for 14 days. Patient not taking: Reported on 01/14/2024 09/06/23 09/20/23  Jule Ronal CROME, PA-C  fluorouracil (EFUDEX) 5 % cream Apply 1 Application topically 2 (two) times daily. 08/24/23   [provider]  folic acid  (FOLVITE ) 1 MG tablet Take 1 mg by mouth daily.    [provider]  gabapentin  (NEURONTIN ) 300 MG capsule TAKE 2 CAPSULES BY MOUTH 4 TIMES DAILY 09/25/23   Onita Duos, MD  HYDROcodone -acetaminophen  (NORCO/VICODIN) 5-325 MG tablet Take 1 tablet by mouth every 6 (six) hours as needed for moderate pain (pain score  4-6). 09/06/23   Jule Ronal CROME, PA-C  hydrocortisone 2.5 % ointment Apply 1 Application topically 2 (two) times daily as needed (itching).    [provider]  Magnesium  400 MG TABS Take 400 mg by mouth daily.    [provider]  melatonin 3 MG TABS tablet Take 3 mg by mouth at bedtime as needed (sleep).    [provider]  metoprolol  tartrate (LOPRESSOR ) 50 MG tablet Take 1 tablet (50 mg total) by mouth 2 (two) times daily. 05/14/23   Wyn Jackee VEAR Mickey., NP  Multiple Vitamins-Minerals (PRESERVISION AREDS 2) CAPS Take 1 capsule by mouth 2 (two) times daily.    [provider]  Polyethyl Glycol-Propyl Glycol (SYSTANE OP) Place 1 drop into both eyes daily as needed (dry eyes).    [provider]  rosuvastatin  (CRESTOR ) 20 MG tablet Take 1 tablet (20 mg total) by mouth daily. 08/30/23   Thukkani, Arun K, MD  sildenafil (REVATIO) 20 MG tablet Take 40-60 mg by mouth daily as needed (ED).    [provider]  telmisartan  (MICARDIS ) 40 MG tablet Take 1 tablet (40 mg total) by mouth daily. 08/30/23   Thukkani, Arun K, MD  thiamine  (VITAMIN B-1) 100 MG tablet Take 100 mg by mouth daily.    [provider]  traZODone  (DESYREL ) 100 MG tablet Take 100 mg by mouth at bedtime.    [provider]  Allergies: Patient has no known allergies.    Review of Systems  Updated Vital Signs BP (!) 179/90   Pulse (!) 57   Temp 100.3 F (37.9 C) (Oral)   Resp 18   Ht 6' 1 (1.854 m)   Wt 72 kg   SpO2 99%   BMI 20.94 kg/m   Physical Exam Constitutional:      General: He is not in acute distress. HENT:     Head: Normocephalic and atraumatic.  Eyes:     Conjunctiva/sclera: Conjunctivae normal.     Pupils: Pupils are equal, round, and reactive to light.  Cardiovascular:     Rate and Rhythm: Normal rate and regular rhythm.  Pulmonary:     Effort: Pulmonary effort is normal. No respiratory distress.  Abdominal:     General: There is no  distension.     Tenderness: There is no abdominal tenderness.  Skin:    General: Skin is warm and dry.  Neurological:     Mental Status: He is alert.     Comments: Speech is slow and mildly slurred.  The patient is not able to complete a full sentence, breaks off, seems to have some tangential thought.  Appears to have some weakness and difficulty with left-sided finger-nose testing compared to the right side.  Strength testing appears weak globally, 4 out of 5 strength.  No evident facial droop     (all labs ordered are listed, but only abnormal results are displayed) Labs Reviewed  CBC - Abnormal; Notable for the following components:      Result Value   WBC 10.8 (*)    All other components within normal limits  DIFFERENTIAL - Abnormal; Notable for the following components:   Neutro Abs 9.7 (*)    Lymphs Abs 0.6 (*)    All other components within normal limits  COMPREHENSIVE METABOLIC PANEL WITH GFR - Abnormal; Notable for the following components:   Potassium 3.3 (*)    Glucose, Bld 106 (*)    Calcium  8.8 (*)    Total Protein 6.2 (*)    Total Bilirubin 1.5 (*)    All other components within normal limits  I-STAT CHEM 8, ED - Abnormal; Notable for the following components:   Potassium 3.4 (*)    Glucose, Bld 105 (*)    Calcium , Ion 1.10 (*)    All other components within normal limits  CBG MONITORING, ED - Abnormal; Notable for the following components:   Glucose-Capillary 102 (*)    All other components within normal limits  RESP PANEL BY RT-PCR (RSV, FLU A&B, COVID)  RVPGX2  ETHANOL  PROTIME-INR  APTT  RAPID URINE DRUG SCREEN, HOSP PERFORMED  URINALYSIS, ROUTINE W REFLEX MICROSCOPIC    EKG: None  Radiology: CT ANGIO HEAD NECK W WO CM W PERF (CODE STROKE) Result Date: 02/03/2024 CLINICAL DATA:  Initial evaluation for acute neuro deficit, stroke suspected. EXAM: CT ANGIOGRAPHY HEAD AND NECK CT PERFUSION BRAIN TECHNIQUE: Multidetector CT imaging of the head and neck  was performed using the standard protocol during bolus administration of intravenous contrast. Multiplanar CT image reconstructions and MIPs were obtained to evaluate the vascular anatomy. Carotid stenosis measurements (when applicable) are obtained utilizing NASCET criteria, using the distal internal carotid diameter as the denominator. Multiphase CT imaging of the brain was performed following IV bolus contrast injection. Subsequent parametric perfusion maps were calculated using RAPID software. RADIATION DOSE REDUCTION: This exam was performed according to the departmental dose-optimization program which includes automated  exposure control, adjustment of the mA and/or kV according to patient size and/or use of iterative reconstruction technique. CONTRAST:  OMNIPAQUE  IOHEXOL  350 MG/ML SOLN COMPARISON:  CT from earlier the same day. FINDINGS: CTA NECK FINDINGS Aortic arch: Visualized aortic arch within normal limits for caliber with standard 3 vessel morphology. Mild aortic atherosclerosis. No stenosis about the origin the great vessels. Right carotid system: Right common and internal carotid arteries are tortuous and ectatic but patent without dissection or stenosis. Left carotid system: Left common and internal carotid arteries are tortuous and ectatic but patent without stenosis or dissection. Vertebral arteries: Both vertebral arteries somewhat tortuous and ectatic but patent without stenosis or dissection. Skeleton: No worrisome osseous lesions.  Prior sternotomy noted. Other neck: No other acute finding. Upper chest: Small layering right pleural effusion noted. Associated mild atelectasis within the visualized right lower lobe. No other acute finding. Review of the MIP images confirms the above findings CTA HEAD FINDINGS Anterior circulation: Both internal carotid arteries widely patent to the termini without stenosis. A1 segments widely patent. Normal anterior communicating artery complex. Both  anterior cerebral arteries widely patent to their distal aspects without stenosis. No M1 stenosis or occlusion. Normal MCA bifurcations. Distal MCA branches well perfused and symmetric. Posterior circulation: Both V4 segments patent to the vertebrobasilar junction without stenosis. Both PICA origins patent and normal. Basilar widely patent to its distal aspect without stenosis. Superior cerebellar arteries patent bilaterally. Both PCAs primarily supplied via the basilar and are well perfused to there distal aspects. Venous sinuses: Patent allowing for timing the contrast bolus. Anatomic variants: None significant.  No aneurysm. Review of the MIP images confirms the above findings CT Brain Perfusion Findings: ASPECTS: 10 CBF (<30%) Volume: 0mL Perfusion (Tmax>6.0s) volume: 0mL Mismatch Volume: 0mL Infarction Location:Negative CT perfusion with no evidence for acute ischemia or other perfusion abnormality. IMPRESSION: 1. Negative CTA of the head and neck. No large vessel occlusion or other emergent finding. 2. Negative CT perfusion with no evidence for acute ischemia or other perfusion abnormality. 3. Diffuse tortuosity and ectasia of the major arterial vasculature of the head and neck, suggesting chronic underlying hypertension. 4. Small layering right pleural effusion. Aortic Atherosclerosis (ICD10-I70.0). These results were communicated to Dr. Arora at 10:22 pm on 02/03/2024 by text page via the [facility] messaging system. Electronically Signed   By: Morene Hoard M.D.   On: 02/03/2024 22:27   CT HEAD CODE STROKE WO CONTRAST Result Date: 02/03/2024 CLINICAL DATA:  Code stroke. Initial evaluation for acute neuro deficit, stroke suspected, left-sided weakness. EXAM: CT HEAD WITHOUT CONTRAST TECHNIQUE: Contiguous axial images were obtained from the base of the skull through the vertex without intravenous contrast. RADIATION DOSE REDUCTION: This exam was performed according to the departmental dose-optimization  program which includes automated exposure control, adjustment of the mA and/or kV according to patient size and/or use of iterative reconstruction technique. COMPARISON:  Prior study from 09/06/2023. FINDINGS: Brain: Cerebral volume within normal limits. Mild to moderate chronic microvascular ischemic disease. No acute intracranial hemorrhage. No acute large vessel territory infarct. No mass lesion or midline shift. No hydrocephalus or extra-axial fluid collection. 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 Vascular: No abnormal hyperdense vessel. Calcified atherosclerosis present at skull base. Skull: Scalp soft tissues within normal limits.  Calvarium intact. Sinuses/Orbits: Globes orbital soft tissues demonstrate no acute finding. Prior scleral buckle on the left. Paranasal sinuses and mastoid air cells are largely clear. Other: None. ASPECTS [facility] Stroke Program Early CT Score) -  Ganglionic level infarction (caudate, lentiform nuclei, internal capsule, insula, M1-M3 cortex): 7 - Supraganglionic infarction (M4-M6 cortex): 3 Total score (0-10 with 10 being normal): 10 IMPRESSION: 1. No acute intracranial abnormality. 2. ASPECTS is 10. 3. Mild-to-moderate chronic microvascular ischemic disease. These results were communicated to Dr. Arora at 10:10 pm on 02/03/2024 by text page via the [facility] messaging system. Electronically Signed   By: Morene Hoard M.D.   On: 02/03/2024 22:12     Procedures   Medications Ordered in the ED  sodium chloride  flush (NS) 0.9 % injection 3 mL (has no administration in time range)  iohexol  (OMNIPAQUE ) 350 MG/ML injection 75 mL (100 mLs Intravenous Contrast Given 02/03/24 2216)    Clinical Course as of 02/03/24 2248  Sun Feb 03, 2024  2240 Dr voncile neurologist recommending hospital admission for MRI, possible EEG [MT]    Clinical Course User Index [MT] Cristan Scherzer, Donnice PARAS, MD                                 Medical Decision Making Amount and/or  Complexity of Data Reviewed Labs: ordered. Radiology: ordered.   This patient presents to the Emergency Department with complaint of altered mental status.  This involves an extensive number of treatment options, and is a complaint that carries with it a high risk of complications and morbidity.  The differential diagnosis includes hypoglycemia vs metabolic encephalopathy vs infection (including cystitis) vs ICH vs stroke vs polypharmacy vs other  Viral illness including influenza and COVID-19 are also on the differential  I ordered, reviewed, and interpreted labs, including very mild hypokalemia.  No significant acute anemia.  COVID, UDS, UA are pending on admission I ordered imaging studies which included CT head, CT angiogram I independently visualized and interpreted imaging which showed no emergent findings Additional history was obtained from EMS Previous records obtained and reviewed showing alcohol use disorder records  I consulted neurology and discussed lab and imaging findings. See consult note and ED course  After the interventions stated above, I reevaluated the patient and found there appears to be some cognitive improvement since he arrived.  This does raise a concern the possibility for an earlier seizure, and postictal state now improving.  Doubt this is status epilepticus.  He is noted to have a temperature orally of 100.3.  There is no clear infection nidus at this time, but we will check a UA, as well as viral testing.  I doubt that this is acute CNS infection.  Will admit to hospitalist      Final diagnoses:  Slurred speech  Confusion    ED Discharge Orders     None          Florine Sprenkle, Donnice PARAS, MD 02/03/24 2248

## 2024-02-03 NOTE — Consult Note (Signed)
 NEUROLOGY CONSULT NOTE   Date of service: February 03, 2024 Patient Name: ODEL SCHMID MRN:  [id number] DOB:  [birth year] Chief Complaint: Code stroke-aphasia, left-sided dysmetria Requesting Provider: Marcene Eva NOVAK, DO  History of Present Illness  GRADYN SHEIN is a 73 y.o. 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 Per EMS report, last known well was around 3 PM and wife last saw him.  She was not feeling well herself and went to bed, checked him on 5 PM when he was possibly not acting all like himself but she was unwell, unable to sleep, and upon waking up again noticed that he sounded confused and was not following everything that was being said to him.  EMS was called.  On their evaluation, he seemed to have some left-sided incoordination and ataxia.  Code stroke was activated. Wife said that he has quit drinking since July and no one in the house drinks in the house around him, since she has had struggles with alcohol abuse and the last admission for concern for stroke it was also thought to be secondary to alcohol related encephalopathy. Patient unable to provide clear history at this time  As his imaging was completed and he was being brought to the room, he was much clearer, and realized that he was in the hospital.  He still could not tell his age correctly.  Rosemarie is 73 years old and is actually 73 years old.  He I specifically asked about any witnessed seizure.  EMS denied any report of seizure activity from family.  LKW: 1500 hrs. Modified rankin score: 1-No significant post stroke disability and can perform usual duties with stroke symptoms IV Thrombolysis:  Outside the window EVT: No ELVO  NIHSS components Score: Comment  1a Level of Conscious 0[x]  1[]  2[]  3[]      1b LOC Questions 0[]  1[]  2[x]       1c LOC Commands 0[x]  1[]  2[]       2 Best Gaze 0[x]  1[]  2[]       3 Visual 0[x]  1[]  2[]  3[]      4 Facial Palsy 0[x]  1[]  2[]  3[]      5a Motor Arm - left 0[x]  1[]  2[]  3[]  4[]  UN[]    5b Motor Arm - Right 0[x]  1[]  2[]  3[]  4[]  UN[]    6a Motor Leg - Left 0[x]  1[]  2[]  3[]  4[]  UN[]    6b Motor Leg - Right 0[x]  1[]  2[]  3[]  4[]  UN[]    7 Limb Ataxia 0[]  1[x]  2[]  UN[]      8 Sensory 0[x]  1[]  2[]  UN[]      9 Best Language 0[]  1[x]  2[]  3[]      10 Dysarthria 0[]  1[x]  2[]  UN[]      11 Extinct. and Inattention 0[x]  1[]  2[]       TOTAL: 5      ROS  Comprehensive ROS performed and pertinent positives documented in HPI   Past History   Past Medical History:  Diagnosis Date   Atrial fibrillation (HCC)    Cancer (HCC)    Skin Cancer - Scalp, Right neck, Left Hand   Dysrhythmia    A. Fib while hospitalized June 2024   Heart murmur    History of nonmelanoma  skin cancer    Hypercholesteremia    Hypertension    Pneumonia 08/2022   Trigeminal neuralgia     Past Surgical History:  Procedure Laterality Date   ATRIAL FIBRILLATION ABLATION N/A 05/24/2023   Procedure: ATRIAL FIBRILLATION ABLATION;  Surgeon: Nancey Eulas BRAVO, MD;  Location: [facility];  Service: Cardiovascular;  Laterality: N/A;   CATARACT EXTRACTION W/ INTRAOCULAR LENS IMPLANT Bilateral    CLIPPING OF ATRIAL APPENDAGE Left 12/08/2022   Procedure: CLIPPING OF ATRIAL APPENDAGE (LAA) USING 50 MM ATRICLIP;  Surgeon: Maryjane Mt, MD;  Location: [facility] OR;  Service: Open Heart Surgery;  Laterality: Left;   INGUINAL HERNIA REPAIR  11/07/2011   Procedure: LAPAROSCOPIC INGUINAL HERNIA;  Surgeon: Camellia CHRISTELLA Blush, MD,FACS;  Location: [facility];  Service: General;  Laterality: Left;   MAZE N/A 12/08/2022   Procedure: MAZE;  Surgeon: Maryjane Mt, MD;  Location: [facility] OR;  Service: Open Heart Surgery;   Laterality: N/A;   MITRAL VALVE REPAIR N/A 12/08/2022   Procedure: MITRAL VALVE REPAIR (MVR) USING 34 MM SIMULUS SEMI- RIGID ANNULOPLASTY BAND;  Surgeon: Maryjane Mt, MD;  Location: [facility] OR;  Service: Open Heart Surgery;  Laterality: N/A;   RETINAL DETACHMENT SURGERY  03/07/1995   RIGHT/LEFT HEART CATH AND CORONARY ANGIOGRAPHY N/A 10/12/2022   Procedure: RIGHT/LEFT HEART CATH AND CORONARY ANGIOGRAPHY;  Surgeon: Wendel Lurena POUR, MD;  Location: [facility];  Service: Cardiovascular;  Laterality: N/A;   SKIN CANCER EXCISION  2012, 1999   basal cell - neck and hand   TEE WITHOUT CARDIOVERSION N/A 10/16/2022   Procedure: TRANSESOPHAGEAL ECHOCARDIOGRAM;  Surgeon: Santo Stanly LABOR, MD;  Location: [facility];  Service: Cardiovascular;  Laterality: N/A;   TEE WITHOUT CARDIOVERSION N/A 12/08/2022   Procedure: TRANSESOPHAGEAL ECHOCARDIOGRAM;  Surgeon: Maryjane Mt, MD;  Location: [facility] OR;  Service: Open Heart Surgery;  Laterality: N/A;   TOTAL HIP ARTHROPLASTY Left 09/05/2023   Procedure: ARTHROPLASTY, HIP, TOTAL, ANTERIOR APPROACH;  Surgeon: Jerri Kay CHRISTELLA, MD;  Location: [facility] OR;  Service: Orthopedics;  Laterality: Left;    Family History: Family History  Problem Relation Age of Onset   Heart disease Father    Heart disease Mother     Social History  reports that he has never smoked. He has never used smokeless tobacco. He reports that he does not currently use alcohol. He reports that he does not use drugs.  No Known Allergies  Medications   Current Facility-Administered Medications:    acetaminophen  (TYLENOL ) tablet 650 mg, 650 mg, Oral, Q6H PRN **OR** acetaminophen  (TYLENOL ) suppository 650 mg, 650 mg, Rectal, Q6H PRN, Howerter, Justin B, DO   melatonin tablet 3 mg, 3 mg, Oral, QHS PRN, Howerter, Justin B, DO   ondansetron  (ZOFRAN ) injection 4 mg, 4 mg, Intravenous, Q6H PRN, Howerter, Justin B, DO  Current Outpatient Medications:    acetaminophen  (TYLENOL ) 325 MG tablet,  Take 2 tablets (650 mg total) by mouth every 6 (six) hours. (Patient taking differently: Take 650 mg by mouth every 6 (six) hours as needed for mild pain (pain score 1-3).), Disp: , Rfl:    Ascorbic Acid (VITAMIN C PO), Take 750 mg by mouth daily., Disp: , Rfl:    aspirin  EC 81 MG tablet, Take 1 tablet (81 mg total) by mouth daily. Swallow whole., Disp: , Rfl:    Cholecalciferol (VITAMIN D) 50 MCG (2000 UT) tablet, Take 2,000 Units by mouth daily., Disp: , Rfl:    enoxaparin  (LOVENOX ) 40 MG/0.4ML injection, Inject 0.4  mLs (40 mg total) into the skin daily for 14 days. (Patient not taking: Reported on 01/14/2024), Disp: 5.6 mL, Rfl: 0   fluorouracil (EFUDEX) 5 % cream, Apply 1 Application topically 2 (two) times daily., Disp: , Rfl:    folic acid  (FOLVITE ) 1 MG tablet, Take 1 mg by mouth daily., Disp: , Rfl:    gabapentin  (NEURONTIN ) 300 MG capsule, TAKE 2 CAPSULES BY MOUTH 4 TIMES DAILY, Disp: 480 capsule, Rfl: 0   HYDROcodone -acetaminophen  (NORCO/VICODIN) 5-325 MG tablet, Take 1 tablet by mouth every 6 (six) hours as needed for moderate pain (pain score 4-6)., Disp: 30 tablet, Rfl: 0   hydrocortisone 2.5 % ointment, Apply 1 Application topically 2 (two) times daily as needed (itching)., Disp: , Rfl:    Magnesium  400 MG TABS, Take 400 mg by mouth daily., Disp: , Rfl:    melatonin 3 MG TABS tablet, Take 3 mg by mouth at bedtime as needed (sleep)., Disp: , Rfl:    metoprolol  tartrate (LOPRESSOR ) 50 MG tablet, Take 1 tablet (50 mg total) by mouth 2 (two) times daily., Disp: 180 tablet, Rfl: 3   Multiple Vitamins-Minerals (PRESERVISION AREDS 2) CAPS, Take 1 capsule by mouth 2 (two) times daily., Disp: , Rfl:    Polyethyl Glycol-Propyl Glycol (SYSTANE OP), Place 1 drop into both eyes daily as needed (dry eyes)., Disp: , Rfl:    rosuvastatin  (CRESTOR ) 20 MG tablet, Take 1 tablet (20 mg total) by mouth daily., Disp: 90 tablet, Rfl: 3   sildenafil (REVATIO) 20 MG tablet, Take 40-60 mg by mouth daily as  needed (ED)., Disp: , Rfl:    telmisartan  (MICARDIS ) 40 MG tablet, Take 1 tablet (40 mg total) by mouth daily., Disp: 90 tablet, Rfl: 3   thiamine  (VITAMIN B-1) 100 MG tablet, Take 100 mg by mouth daily., Disp: , Rfl:    traZODone  (DESYREL ) 100 MG tablet, Take 100 mg by mouth at bedtime., Disp: , Rfl:   Vitals   Vitals:   02-21-2024 2238 February 21, 2024 2244  BP: (!) 179/90   Pulse: (!) 57   Resp: 18   Temp: 100.3 F (37.9 C)   TempSrc: Oral   SpO2: 99%   Weight:  72 kg  Height:  6' 1 (1.854 m)    Body mass index is 20.94 kg/m.   Physical Exam   General: He is awake alert able to tell me his name. HEENT: Normocephalic atraumatic CVS: Regular rate rhythm Abdomen nondistended nontender Neurological exam He is awake alert.  He is able to tell me his name, when asked his age, he could not tell me his age correctly initially, later Rosemarie is 73 years old.  When asked his date of birth, he was able to tell that correctly when asked the current month, he was unable to say. Naming was impaired.  Comprehension was intact.  Repetition was intact. Seemed to have poor attention concentration Cranial nerves II to XII intact Motor examination with initially noted mild drift on the left side but with coaching, he has no drift on that side. Sensation intact to light touch bilaterally. Coordination: He does have some dysmetria on the left upper extremity Gait testing deferred at this time   Labs/Imaging/Neurodiagnostic studies   CBC:  Recent Labs  Lab 2024/02/21 2155 02/21/2024 2203  WBC 10.8*  --   NEUTROABS 9.7*  --   HGB 14.4 15.0  HCT 43.4 44.0  MCV 88.8  --   PLT 174  --    Basic Metabolic Panel:  Lab Results  Component Value Date   NA 141 02/03/2024   K 3.4 (L) 02/03/2024   CO2 26 02/03/2024   GLUCOSE 105 (H) 02/03/2024   BUN 13 02/03/2024   CREATININE 0.70 02/03/2024   CALCIUM  8.8 (L) 02/03/2024   GFRNONAA >60 02/03/2024   GFRAA >90 11/03/2011   Lipid Panel:  Lab Results   Component Value Date   LDLCALC 57 09/06/2023   HgbA1c:  Lab Results  Component Value Date   HGBA1C 5.2 09/06/2023   Alcohol Level     Component Value Date/Time   [facility] <15 02/03/2024 2155   INR  Lab Results  Component Value Date   INR 1.1 02/03/2024   APTT  Lab Results  Component Value Date   APTT 29 02/03/2024    CT Head without contrast(Personally reviewed): Unremarkable for acute process.  No bleed.  Aspects 10.  CT angio Head and Neck with contrast(Personally reviewed): No ELVO.  Perfusion study negative for acute ischemia or perfusion deficit  ASSESSMENT   BRODE SCULLEY is a 73 y.o. male with above past medical history was brought in for evaluation of confusion and possible left-sided ataxia.  On examination he appeared more encephalopathic and did have some component of left-sided ataxia, I could not really locate exam to a single vascular territory. He is outside the window for IV TNKase I did a CT angio head and neck to ensure there is no LVO given his history of atrial fibrillation status post ablation not on anticoagulation-studies were negative for acute process/LVO/perfusion deficit. A small stroke is a possibility or showering embolic strokes is a possibility given his atrial fibrillation not on anticoagulation history.  Other possibility could be seizure followed by postictal confusion and Todd's paralysis.  During his last admission, an EEG was performed due to some suspicion for seizure although not clear around the circumstances of that at the time. Observing him in the hospital overnight, obtaining MRI and EEG would be the most prudent next apps at this time  Impression Evaluate for stroke versus seizure versus other causes of encephalopathy  RECOMMENDATIONS  Admit to hospitalist Frequent neurochecks Telemetry MRI brain without contrast-stroke workup to be completed only if MRI reveals an acute stroke. Routine EEG in the morning Check urinalysis and  chest x-ray to make sure there is no underlying infection that may be causing encephalopathy Neurology will follow with you Plan was relayed to Dr. Marcene  ______________________________________________________________________    Signed, Eligio Lav, MD [facility]

## 2024-02-04 ENCOUNTER — Observation Stay (HOSPITAL_COMMUNITY)

## 2024-02-04 ENCOUNTER — Inpatient Hospital Stay (HOSPITAL_COMMUNITY)

## 2024-02-04 DIAGNOSIS — G928 Other toxic encephalopathy: Secondary | ICD-10-CM | POA: Diagnosis not present

## 2024-02-04 DIAGNOSIS — G8384 Todd's paralysis (postepileptic): Secondary | ICD-10-CM | POA: Diagnosis not present

## 2024-02-04 DIAGNOSIS — I639 Cerebral infarction, unspecified: Secondary | ICD-10-CM | POA: Diagnosis not present

## 2024-02-04 DIAGNOSIS — E876 Hypokalemia: Secondary | ICD-10-CM | POA: Diagnosis present

## 2024-02-04 DIAGNOSIS — G9340 Encephalopathy, unspecified: Secondary | ICD-10-CM | POA: Diagnosis not present

## 2024-02-04 LAB — URINALYSIS, MICROSCOPIC (REFLEX)

## 2024-02-04 LAB — COMPREHENSIVE METABOLIC PANEL WITH GFR
ALT: 13 U/L (ref 0–44)
AST: 20 U/L (ref 15–41)
Albumin: 3.1 g/dL — ABNORMAL LOW (ref 3.5–5.0)
Alkaline Phosphatase: 50 U/L (ref 38–126)
Anion gap: 11 (ref 5–15)
BUN: 12 mg/dL (ref 8–23)
CO2: 26 mmol/L (ref 22–32)
Calcium: 8.5 mg/dL — ABNORMAL LOW (ref 8.9–10.3)
Chloride: 99 mmol/L (ref 98–111)
Creatinine, Ser: 0.68 mg/dL (ref 0.61–1.24)
GFR, Estimated: >60 mL/min (ref >60)
Glucose, Bld: 113 mg/dL — ABNORMAL HIGH (ref 70–99)
Potassium: 3.1 mmol/L — ABNORMAL LOW (ref 3.5–5.1)
Sodium: 136 mmol/L (ref 135–145)
Total Bilirubin: 1.4 mg/dL — ABNORMAL HIGH (ref 0.0–1.2)
Total Protein: 5.7 g/dL — ABNORMAL LOW (ref 6.5–8.1)

## 2024-02-04 LAB — CBC WITH DIFFERENTIAL/PLATELET
Abs Immature Granulocytes: 0.03 K/uL (ref 0.00–0.07)
Basophils Absolute: 0 K/uL (ref 0.0–0.1)
Basophils Relative: 0 %
Eosinophils Absolute: 0 K/uL (ref 0.0–0.5)
Eosinophils Relative: 0 %
HCT: 40 % (ref 39.0–52.0)
Hemoglobin: 13.5 g/dL (ref 13.0–17.0)
Immature Granulocytes: 0 %
Lymphocytes Relative: 5 %
Lymphs Abs: 0.4 K/uL — ABNORMAL LOW (ref 0.7–4.0)
MCH: 30.3 pg (ref 26.0–34.0)
MCHC: 33.8 g/dL (ref 30.0–36.0)
MCV: 89.7 fL (ref 80.0–100.0)
Monocytes Absolute: 0.3 K/uL (ref 0.1–1.0)
Monocytes Relative: 4 %
Neutro Abs: 7.2 K/uL (ref 1.7–7.7)
Neutrophils Relative %: 91 %
Platelets: 183 K/uL (ref 150–400)
RBC: 4.46 MIL/uL (ref 4.22–5.81)
RDW: 14.9 % (ref 11.5–15.5)
WBC: 8 K/uL (ref 4.0–10.5)
nRBC: 0 % (ref 0.0–0.2)

## 2024-02-04 LAB — RAPID URINE DRUG SCREEN, HOSP PERFORMED
Amphetamines: NOT DETECTED
Barbiturates: NOT DETECTED
Benzodiazepines: NOT DETECTED
Cocaine: NOT DETECTED
Opiates: NOT DETECTED
Tetrahydrocannabinol: NOT DETECTED

## 2024-02-04 LAB — TSH: TSH: 0.624 u[IU]/mL (ref 0.350–4.500)

## 2024-02-04 LAB — URINALYSIS, ROUTINE W REFLEX MICROSCOPIC
Bilirubin Urine: NEGATIVE
Glucose, UA: NEGATIVE mg/dL
Ketones, ur: NEGATIVE mg/dL
Leukocytes,Ua: NEGATIVE
Nitrite: NEGATIVE
Protein, ur: NEGATIVE mg/dL
Specific Gravity, Urine: 1.01 (ref 1.005–1.030)
pH: 7.5 (ref 5.0–8.0)

## 2024-02-04 LAB — MAGNESIUM: Magnesium: 1.7 mg/dL (ref 1.7–2.4)

## 2024-02-04 LAB — RESP PANEL BY RT-PCR (RSV, FLU A&B, COVID)  RVPGX2
Influenza A by PCR: NEGATIVE
Influenza B by PCR: NEGATIVE
Resp Syncytial Virus by PCR: NEGATIVE
SARS Coronavirus 2 by RT PCR: NEGATIVE

## 2024-02-04 LAB — VITAMIN B12: Vitamin B-12: 240 pg/mL (ref 180–914)

## 2024-02-04 LAB — CK: Total CK: 93 U/L (ref 49–397)

## 2024-02-04 MED ORDER — MAGNESIUM SULFATE 2 GM/50ML IV SOLN
2.0000 g | Freq: Once | INTRAVENOUS | Status: AC
  Administered 2024-02-04: 2 g via INTRAVENOUS
  Filled 2024-02-04: qty 50

## 2024-02-04 MED ORDER — ROSUVASTATIN CALCIUM 20 MG PO TABS
20.0000 mg | ORAL_TABLET | Freq: Every day | ORAL | Status: DC
  Administered 2024-02-04 – 2024-02-05 (×2): 20 mg via ORAL
  Filled 2024-02-04 (×2): qty 1

## 2024-02-04 MED ORDER — VITAMIN B-12 1000 MCG PO TABS
1000.0000 ug | ORAL_TABLET | Freq: Every day | ORAL | Status: DC
  Administered 2024-02-04 – 2024-02-05 (×2): 1000 ug via ORAL
  Filled 2024-02-04 (×2): qty 1

## 2024-02-04 MED ORDER — POTASSIUM CHLORIDE CRYS ER 20 MEQ PO TBCR
40.0000 meq | EXTENDED_RELEASE_TABLET | Freq: Once | ORAL | Status: AC
  Administered 2024-02-04: 40 meq via ORAL
  Filled 2024-02-04: qty 2

## 2024-02-04 MED ORDER — AMLODIPINE BESYLATE 5 MG PO TABS
2.5000 mg | ORAL_TABLET | Freq: Every morning | ORAL | Status: DC
  Administered 2024-02-04 – 2024-02-05 (×2): 2.5 mg via ORAL
  Filled 2024-02-04 (×2): qty 1

## 2024-02-04 MED ORDER — METOPROLOL TARTRATE 25 MG PO TABS: 50.0000 mg | ORAL_TABLET | Freq: Two times a day (BID) | ORAL | Status: DC

## 2024-02-04 MED ORDER — IRBESARTAN 300 MG PO TABS
150.0000 mg | ORAL_TABLET | Freq: Every day | ORAL | Status: DC
  Administered 2024-02-04 – 2024-02-05 (×2): 150 mg via ORAL
  Filled 2024-02-04 (×2): qty 1

## 2024-02-04 MED ORDER — ASPIRIN 81 MG PO TBEC
81.0000 mg | DELAYED_RELEASE_TABLET | Freq: Every day | ORAL | Status: DC
  Administered 2024-02-04 – 2024-02-05 (×2): 81 mg via ORAL
  Filled 2024-02-04 (×2): qty 1

## 2024-02-04 NOTE — Progress Notes (Signed)
 vLTM setup  All impedances below 10k  Skin is in poor condition and scaly and slothing.   ED patient   [facility] not monitoring

## 2024-02-04 NOTE — ED Notes (Signed)
 Pt continues to pull condom catheter off, remove ECG leads, and undress in bed. Pt cleaned, bed changed, and new catheter applied at this time. Safety sitter ordered.

## 2024-02-04 NOTE — ED Notes (Signed)
 Pt unable to urinate in urinal . Pt urinated in bed. Pt cleaned placed on new sheets and condom catheter placed on pt.

## 2024-02-04 NOTE — Progress Notes (Signed)
 EEG equipment transferred to 5W21 by tech after patient had been moved. [facility] now monitoring. LTM maint complete - no skin breakdown under:  Fp2, P7, A2. Head wrapped.

## 2024-02-04 NOTE — Plan of Care (Signed)
   Problem: Nutrition: Goal: Adequate nutrition will be maintained Outcome: Progressing   Problem: Coping: Goal: Level of anxiety will decrease Outcome: Progressing

## 2024-02-04 NOTE — ED Notes (Signed)
 Assuming pt care, pt bib ems coming from home for slow speech, and LT sided coordination deficits, last known normal was 5pm yesterday. Pt aaox3 disorientated to time, mae, skin warm/dry. Call bell used explained and demonstrated back.

## 2024-02-04 NOTE — Progress Notes (Signed)
 PROGRESS NOTE    James Ibarra  FMW:[id number] DOB: June 03, 1950 DOA: 02/03/2024 PCP: Kip Righter, MD    Brief Narrative:  James Ibarra is a 73 y.o. male with medical history significant for paroxysmal atrial fibrillation not on chronic Ibarra, essential hypertension, PFO closure in October 2024, who is admitted to [facility] on 02/03/2024 with acute encephalopathy after presenting from home to [facility] ED complaining of altered mental status.    Assessment and Plan:   #) Acute encephalopathy: Presents with new onset altered mental status, confusion starting around 1700 on 02/03/2024, associated initially with reported dysarthria.  Mental status improving relative to initial presentation, no overt acute focal neurologic deficits on exam - Neurology consulted.  - MRI brain w/o stroke -EEG pending -=B12- lower end of normal - TSH ok      fever -get blood cultures if >100 -x ray with atelectasis -U/A unremarkable  H/o alcohol abuse -quit in June 2025     Paroxysmal atrial fibrillation: Documented history of such. In setting of CHA2DS2-VASc score of  3, there is an indication for chronic Ibarra for thromboembolic prophylaxis.   -he is not currently on formal Ibarra.  -Home AV nodal blocking regimen: Lopressor  50 mg p.o. twice daily held due to sinus bradycardia with heart rates in the 50s.   - echocardiogram July 2025, and was notable for LVEF 77 5%, no evidence of focal wall motion maladies, grade 1 diastolic dysfunction, grade normal right ventricular systolic function, status post mitral valve repair, without evidence of mitral regurgitation or mitral stenosis.           Essential Hypertension:  - outpatient antihypertensive regimen including telmisartan , metoprolol  tartrate -resume        hypokalemia -replete      Hyperlipidemia: -CK ok -resume statin        DVT prophylaxis: SCDs Start: 02/03/24 2309    Code Status: Full  Code Family Communication:   Disposition Plan:  Level of care: Progressive Status is: Inpatient     Consultants:  neurology   Subjective: Says no alcohol since June best decision of my life  Objective: Vitals:   02/04/24 0700 02/04/24 0715 02/04/24 1030 02/04/24 1046  BP:  (!) 152/89 (!) 178/87   Pulse: (!) 58 (!) 57 61   Resp: 15 19 15    Temp: 99.2 F (37.3 C)   99.6 F (37.6 C)  TempSrc:    Oral  SpO2: 100% 100% 99%   Weight:      Height:        Intake/Output Summary (Last 24 hours) at 02/04/2024 1134 Last data filed at 02/04/2024 1111 Gross per 24 hour  Intake 43.07 ml  Output --  Net 43.07 ml   Filed Weights   02/03/24 2244  Weight: 72 kg    Examination:   General: Appearance:    Well developed, well nourished male in no acute distress     Lungs:     respirations unlabored  Heart:    Normal heart rate.      MS:   All extremities are intact.    Neurologic:   Awake, alert- speech slowed       Data Reviewed: I have personally reviewed following labs and imaging studies  CBC: Recent Labs  Lab 02/03/24 2155 02/03/24 2203 02/04/24 0429  WBC 10.8*  --  8.0  NEUTROABS 9.7*  --  7.2  HGB 14.4 15.0 13.5  HCT 43.4 44.0 40.0  MCV 88.8  --  89.7  PLT 174  --  183   Basic Metabolic Panel: Recent Labs  Lab 02/03/24 2155 02/03/24 2203 02/03/24 2231 02/04/24 0429  NA 139 141  --  136  K 3.3* 3.4*  --  3.1*  CL 103 100  --  99  CO2 26  --   --  26  GLUCOSE 106* 105*  --  113*  BUN 12 13  --  12  CREATININE 0.72 0.70  --  0.68  CALCIUM  8.8*  --   --  8.5*  MG  --   --  1.8 1.7   GFR: Estimated Creatinine Clearance: 83.8 mL/min (by C-G formula based on SCr of 0.68 mg/dL). Liver Function Tests: Recent Labs  Lab 02/03/24 2155 02/04/24 0429  AST 21 20  ALT 16 13  ALKPHOS 53 50  BILITOT 1.5* 1.4*  PROT 6.2* 5.7*  ALBUMIN  3.5 3.1*   No results for input(s): LIPASE, AMYLASE in the last 168 hours. No results for input(s):  AMMONIA in the last 168 hours. Coagulation Profile: Recent Labs  Lab 02/03/24 2231  INR 1.1   Cardiac Enzymes: Recent Labs  Lab 02/03/24 2231  CKTOTAL 93   BNP (last 3 results) No results for input(s): PROBNP in the last 8760 hours. HbA1C: No results for input(s): HGBA1C in the last 72 hours. CBG: Recent Labs  Lab 02/03/24 2154  GLUCAP 102*   Lipid Profile: No results for input(s): CHOL, HDL, LDLCALC, TRIG, CHOLHDL, LDLDIRECT in the last 72 hours. Thyroid  Function Tests: Recent Labs    02/03/24 2231  TSH 0.624   Anemia Panel: Recent Labs    02/03/24 2231  VITAMINB12 240   Sepsis Labs: No results for input(s): PROCALCITON, LATICACIDVEN in the last 168 hours.  Recent Results (from the past 240 hours)  Resp panel by RT-PCR (RSV, Flu A&B, Covid) Anterior Nasal Swab     Status: None   Collection Time: 02/03/24 10:41 PM   Specimen: Anterior Nasal Swab  Result Value Ref Range Status   SARS Coronavirus 2 by RT PCR NEGATIVE NEGATIVE Final   Influenza A by PCR NEGATIVE NEGATIVE Final   Influenza B by PCR NEGATIVE NEGATIVE Final    Comment: (NOTE) The Xpert Xpress SARS-CoV-2/FLU/RSV plus assay is intended as an aid in the diagnosis of influenza from Nasopharyngeal swab specimens and should not be used as a sole basis for treatment. Nasal washings and aspirates are unacceptable for Xpert Xpress SARS-CoV-2/FLU/RSV testing.  Fact Sheet for Patients: bloggercourse.com  Fact Sheet for Healthcare Providers: seriousbroker.it  This test is not yet approved or cleared by the [place]  FDA and has been authorized for detection and/or diagnosis of SARS-CoV-2 by FDA under an Emergency Use Authorization (EUA). This EUA will remain in effect (meaning this test can be used) for the duration of the COVID-19 declaration under Section 564(b)(1) of the Act, 21 U.S.C. section 360bbb-3(b)(1), unless the  authorization is terminated or revoked.     Resp Syncytial Virus by PCR NEGATIVE NEGATIVE Final    Comment: (NOTE) Fact Sheet for Patients: bloggercourse.com  Fact Sheet for Healthcare Providers: seriousbroker.it  This test is not yet approved or cleared by the [place]  FDA and has been authorized for detection and/or diagnosis of SARS-CoV-2 by FDA under an Emergency Use Authorization (EUA). This EUA will remain in effect (meaning this test can be used) for the duration of the COVID-19 declaration under Section 564(b)(1) of the Act, 21 U.S.C. section 360bbb-3(b)(1), unless the authorization is terminated or revoked.  Performed at [facility], [address] [place], [place] [address]          Radiology Studies: DG Chest Port 1 View Result Date: 02/04/2024 CLINICAL DATA:  Acute encephalopathy. EXAM: PORTABLE CHEST 1 VIEW COMPARISON:  09/04/2023 FINDINGS: Postoperative changes compatible with previous mitral valve surgery. No pulmonary edema. Bandlike densities in the right lower lung are suggestive for atelectasis. Remainder the lungs are clear. Heart size is upper limits of normal but stable. Negative for a pneumothorax. IMPRESSION: 1. Bandlike densities in the right lower lung are suggestive for atelectasis. Electronically Signed   By: Juliene Balder M.D.   On: 02/04/2024 09:29   MR BRAIN WO CONTRAST Result Date: 02/04/2024 EXAM: MRI BRAIN WITHOUT CONTRAST 02/04/2024 07:56:52 AM TECHNIQUE: Multiplanar multisequence MRI of the head/brain was performed without the administration of intravenous contrast. COMPARISON: MRI of the head dated 11/11/2023. CLINICAL HISTORY: Delirium. FINDINGS: BRAIN AND VENTRICLES: No acute infarct. No intracranial hemorrhage. No mass. No midline shift. No hydrocephalus. Interval resolution of restricted diffusion within the left hippocampus. There is moderate diffuse cerebral volume loss again  demonstrated and mild-to-moderate periventricular and deep cerebral white matter disease. The sella is unremarkable. Normal flow voids. ORBITS: No acute abnormality. SINUSES AND MASTOIDS: No acute abnormality. BONES AND SOFT TISSUES: Normal marrow signal. No acute soft tissue abnormality. IMPRESSION: 1. Interval resolution of restricted diffusion within the left hippocampus. 2. Moderate diffuse cerebral volume loss. 3. 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 Electronically signed by: Evalene Coho MD 02/04/2024 08:45 AM EST RP Workstation: [id number]   CT ANGIO HEAD NECK W WO CM W PERF (CODE STROKE) Result Date: 02/03/2024 CLINICAL DATA:  Initial evaluation for acute neuro deficit, stroke suspected. EXAM: CT ANGIOGRAPHY HEAD AND NECK CT PERFUSION BRAIN TECHNIQUE: Multidetector CT imaging of the head and neck was performed using the standard protocol during bolus administration of intravenous contrast. Multiplanar CT image reconstructions and MIPs were obtained to evaluate the vascular anatomy. Carotid stenosis measurements (when applicable) are obtained utilizing NASCET criteria, using the distal internal carotid diameter as the denominator. Multiphase CT imaging of the brain was performed following IV bolus contrast injection. Subsequent parametric perfusion maps were calculated using RAPID software. RADIATION DOSE REDUCTION: This exam was performed according to the departmental dose-optimization program which includes automated exposure control, adjustment of the mA and/or kV according to patient size and/or use of iterative reconstruction technique. CONTRAST:  100mL OMNIPAQUE  IOHEXOL  350 MG/ML SOLN COMPARISON:  CT from earlier the same day. FINDINGS: CTA NECK FINDINGS Aortic arch: Visualized aortic arch within normal limits for caliber with standard 3 vessel morphology. Mild aortic atherosclerosis. No stenosis about the origin the great vessels. Right carotid system: Right  common and internal carotid arteries are tortuous and ectatic but patent without dissection or stenosis. Left carotid system: Left common and internal carotid arteries are tortuous and ectatic but patent without stenosis or dissection. Vertebral arteries: Both vertebral arteries somewhat tortuous and ectatic but patent without stenosis or dissection. Skeleton: No worrisome osseous lesions.  Prior sternotomy noted. Other neck: No other acute finding. Upper chest: Small layering right pleural effusion noted. Associated mild atelectasis within the visualized right lower lobe. No other acute finding. Review of the MIP images confirms the above findings CTA HEAD FINDINGS Anterior circulation: Both internal carotid arteries widely patent to the termini without stenosis. A1 segments widely patent. Normal anterior communicating artery complex. Both anterior cerebral arteries widely patent to their distal aspects without stenosis. No M1 stenosis or occlusion.  Normal MCA bifurcations. Distal MCA branches well perfused and symmetric. Posterior circulation: Both V4 segments patent to the vertebrobasilar junction without stenosis. Both PICA origins patent and normal. Basilar widely patent to its distal aspect without stenosis. Superior cerebellar arteries patent bilaterally. Both PCAs primarily supplied via the basilar and are well perfused to there distal aspects. Venous sinuses: Patent allowing for timing the contrast bolus. Anatomic variants: None significant.  No aneurysm. Review of the MIP images confirms the above findings CT Brain Perfusion Findings: ASPECTS: 10 CBF (<30%) Volume: 0mL Perfusion (Tmax>6.0s) volume: 0mL Mismatch Volume: 0mL Infarction Location:Negative CT perfusion with no evidence for acute ischemia or other perfusion abnormality. IMPRESSION: 1. Negative CTA of the head and neck. No large vessel occlusion or other emergent finding. 2. Negative CT perfusion with no evidence for acute ischemia or other  perfusion abnormality. 3. Diffuse tortuosity and ectasia of the major arterial vasculature of the head and neck, suggesting chronic underlying hypertension. 4. Small layering right pleural effusion. Aortic Atherosclerosis (ICD10-I70.0). These results were communicated to Dr. Arora at 10:22 pm on 02/03/2024 by text page via the [facility] messaging system. Electronically Signed   By: Morene Hoard M.D.   On: 02/03/2024 22:27   CT HEAD CODE STROKE WO CONTRAST Result Date: 02/03/2024 CLINICAL DATA:  Code stroke. Initial evaluation for acute neuro deficit, stroke suspected, left-sided weakness. EXAM: CT HEAD WITHOUT CONTRAST TECHNIQUE: Contiguous axial images were obtained from the base of the skull through the vertex without intravenous contrast. RADIATION DOSE REDUCTION: This exam was performed according to the departmental dose-optimization program which includes automated exposure control, adjustment of the mA and/or kV according to patient size and/or use of iterative reconstruction technique. COMPARISON:  Prior study from 09/06/2023. FINDINGS: Brain: Cerebral volume within normal limits. Mild to moderate chronic microvascular ischemic disease. No acute intracranial hemorrhage. No acute large vessel territory infarct. No mass lesion or midline shift. No hydrocephalus or extra-axial fluid collection. 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 Vascular: No abnormal hyperdense vessel. Calcified atherosclerosis present at skull base. Skull: Scalp soft tissues within normal limits.  Calvarium intact. Sinuses/Orbits: Globes orbital soft tissues demonstrate no acute finding. Prior scleral buckle on the left. Paranasal sinuses and mastoid air cells are largely clear. Other: None. ASPECTS [facility] Stroke Program Early CT Score) - Ganglionic level infarction (caudate, lentiform nuclei, internal capsule, insula, M1-M3 cortex): 7 - Supraganglionic infarction (M4-M6 cortex): 3 Total score (0-10 with 10 being  normal): 10 IMPRESSION: 1. No acute intracranial abnormality. 2. ASPECTS is 10. 3. Mild-to-moderate chronic microvascular ischemic disease. These results were communicated to Dr. Arora at 10:10 pm on 02/03/2024 by text page via the [facility] messaging system. Electronically Signed   By: Morene Hoard M.D.   On: 02/03/2024 22:12        Scheduled Meds:  aspirin  EC  81 mg Oral Daily   irbesartan   150 mg Oral Daily   rosuvastatin   20 mg Oral Daily   Continuous Infusions:   LOS: 0 days    Time spent: 45 minutes spent on chart review, discussion with nursing staff, consultants, updating family and interview/physical exam; more than 50% of that time was spent in counseling and/or coordination of care.    Harlene RAYMOND Bowl, DO [facility] Available via Epic secure chat 7am-7pm After these hours, please refer to coverage provider listed on [email or web address] 02/04/2024, 11:34 AM

## 2024-02-04 NOTE — ED Notes (Signed)
 Pt in MRI.

## 2024-02-04 NOTE — Progress Notes (Addendum)
 NEUROLOGY CONSULT FOLLOW UP NOTE   Date of service: February 04, 2024 Patient Name: James Ibarra MRN:  [id number] DOB:  04-Feb-1951  Interval Hx/subjective   Seen in room with no family at the bedside. Able to tell me name and age but difficulty answering other questions  Vitals   Vitals:   02/04/24 0215 02/04/24 0300 02/04/24 0600 02/04/24 0700  BP: (!) 155/85 (!) 165/83 (!) 149/96   Pulse: 60 (!) 55 (!) 58 (!) 58  Resp: 15 15 18 15   Temp:  99.1 F (37.3 C)  99.2 F (37.3 C)  TempSrc:      SpO2: 97% 99% 100% 100%  Weight:      Height:         Body mass index is 20.94 kg/m.  Physical Exam   Constitutional: Appears well-developed and well-nourished.  Psych: Affect appropriate to situation.  Eyes: No scleral injection.  HENT: No OP obstrucion.  Head: Normocephalic.  Cardiovascular: Normal rate and regular rhythm.  Respiratory: Effort normal, non-labored breathing.  GI: Soft.  No distension. There is no tenderness.  Skin: WDI.   Neurologic Examination    Neuro: Mental Status: Patient is awake, alert, oriented to person, but unable to state location, month, or year. He does pick correct month and location when given options. Word finding difficulties in conversation but no receptive aphasia.  Cranial Nerves: II: Visual Fields are full. Pupils are equal, round, and reactive to light.   III,IV, VI: EOMI without ptosis or diploplia.  V: Facial sensation is symmetric to temperature VII: Facial movement is symmetric resting and smiling VIII: Hearing is intact to voice X: Palate elevates symmetrically XI: Shoulder shrug is symmetric. XII: Tongue protrudes midline without atrophy or fasciculations.  Motor: Tone is normal. Bulk is normal. 5/5 strength was present in all four extremities.  Sensory: Sensation is symmetric to light touch and temperature in the arms and legs. No extinction to DSS present.  Cerebellar: Initially noted dysmetria on the left FNF but when  retested it is mild    Medications  Current Facility-Administered Medications:    acetaminophen  (TYLENOL ) tablet 650 mg, 650 mg, Oral, Q6H PRN **OR** acetaminophen  (TYLENOL ) suppository 650 mg, 650 mg, Rectal, Q6H PRN, Howerter, Justin B, DO   aspirin  EC tablet 81 mg, 81 mg, Oral, Daily, Howerter, Justin B, DO   irbesartan  (AVAPRO ) tablet 150 mg, 150 mg, Oral, Daily, Howerter, Justin B, DO   melatonin tablet 3 mg, 3 mg, Oral, QHS PRN, Howerter, Justin B, DO   ondansetron  (ZOFRAN ) injection 4 mg, 4 mg, Intravenous, Q6H PRN, Howerter, Justin B, DO   rosuvastatin  (CRESTOR ) tablet 20 mg, 20 mg, Oral, Daily, Howerter, Justin B, DO  Current Outpatient Medications:    acetaminophen  (TYLENOL ) 325 MG tablet, Take 2 tablets (650 mg total) by mouth every 6 (six) hours., Disp: , Rfl:    amLODipine  (NORVASC ) 2.5 MG tablet, Take 2.5 mg by mouth in the morning., Disp: , Rfl:    ascorbic acid (VITAMIN C) 250 MG tablet, Take 750 mg by mouth daily. Take three tablets (750mg ) by mouth daily in the morning., Disp: , Rfl:    aspirin  EC 81 MG tablet, Take 1 tablet (81 mg total) by mouth daily. Swallow whole., Disp: , Rfl:    Cholecalciferol (VITAMIN D) 50 MCG (2000 UT) tablet, Take 2,000 Units by mouth daily., Disp: , Rfl:    fluorouracil (EFUDEX) 5 % cream, Apply 1 Application topically 2 (two) times daily., Disp: , Rfl:  folic acid  (FOLVITE ) 1 MG tablet, Take 1 mg by mouth daily., Disp: , Rfl:    gabapentin  (NEURONTIN ) 300 MG capsule, TAKE 2 CAPSULES BY MOUTH 4 TIMES DAILY, Disp: 480 capsule, Rfl: 0   HYDROcodone -acetaminophen  (NORCO/VICODIN) 5-325 MG tablet, Take 1 tablet by mouth every 6 (six) hours as needed for moderate pain (pain score 4-6)., Disp: 30 tablet, Rfl: 0   hydrocortisone 2.5 % ointment, Apply 1 Application topically 2 (two) times daily as needed (itching)., Disp: , Rfl:    Magnesium  400 MG TABS, Take 400 mg by mouth daily., Disp: , Rfl:    melatonin 3 MG TABS tablet, Take 3 mg by mouth at  bedtime as needed (sleep)., Disp: , Rfl:    metoprolol  tartrate (LOPRESSOR ) 50 MG tablet, Take 1 tablet (50 mg total) by mouth 2 (two) times daily., Disp: 180 tablet, Rfl: 3   Multiple Vitamins-Minerals (PRESERVISION AREDS 2) CAPS, Take 1 capsule by mouth 2 (two) times daily., Disp: , Rfl:    Polyethyl Glycol-Propyl Glycol (SYSTANE OP), Place 1 drop into both eyes daily as needed (dry eyes)., Disp: , Rfl:    rosuvastatin  (CRESTOR ) 20 MG tablet, Take 1 tablet (20 mg total) by mouth daily., Disp: 90 tablet, Rfl: 3   sildenafil (REVATIO) 20 MG tablet, Take 40-60 mg by mouth daily as needed (ED)., Disp: , Rfl:    telmisartan  (MICARDIS ) 40 MG tablet, Take 1 tablet (40 mg total) by mouth daily., Disp: 90 tablet, Rfl: 3   thiamine  (VITAMIN B-1) 100 MG tablet, Take 100 mg by mouth daily., Disp: , Rfl:    traZODone  (DESYREL ) 100 MG tablet, Take 100 mg by mouth at bedtime., Disp: , Rfl:   Labs and Diagnostic Imaging   CBC:  Recent Labs  Lab 02/03/24 2155 02/03/24 2203 02/04/24 0429  WBC 10.8*  --  8.0  NEUTROABS 9.7*  --  7.2  HGB 14.4 15.0 13.5  HCT 43.4 44.0 40.0  MCV 88.8  --  89.7  PLT 174  --  183    Basic Metabolic Panel:  Lab Results  Component Value Date   NA 136 02/04/2024   K 3.1 (L) 02/04/2024   CO2 26 02/04/2024   GLUCOSE 113 (H) 02/04/2024   BUN 12 02/04/2024   CREATININE 0.68 02/04/2024   CALCIUM  8.5 (L) 02/04/2024   GFRNONAA >60 02/04/2024   GFRAA >90 11/03/2011   Lipid Panel:  Lab Results  Component Value Date   LDLCALC 57 09/06/2023   HgbA1c:  Lab Results  Component Value Date   HGBA1C 5.2 09/06/2023   Urine Drug Screen:     Component Value Date/Time   LABOPIA NONE DETECTED 02/04/2024 0259   COCAINSCRNUR NONE DETECTED 02/04/2024 0259   LABBENZ NONE DETECTED 02/04/2024 0259   AMPHETMU NONE DETECTED 02/04/2024 0259   THCU NONE DETECTED 02/04/2024 0259   LABBARB NONE DETECTED 02/04/2024 0259    Alcohol Level     Component Value Date/Time   [facility] <15  02/03/2024 2155   INR  Lab Results  Component Value Date   INR 1.1 02/03/2024   APTT  Lab Results  Component Value Date   APTT 29 02/03/2024   CT Head without contrast(Personally reviewed): Unremarkable for acute process.  No bleed.  Aspects 10.   CT angio Head and Neck with contrast(Personally reviewed): No ELVO.  Perfusion study negative for acute ischemia or perfusion deficit  MRI Brain(Personally reviewed): 1. Interval resolution of restricted diffusion within the left hippocampus. 2. Moderate diffuse cerebral volume  loss. 3. 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  cEEG: Pending   Assessment   James Ibarra is a 72 y.o. 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 He was outside tnkase window and not a candidate for thrombectomy due to no LVO.  Etiology unclear, differential includes small stroke, post ictal todds, toxic metabolic encephalopathy.  Recommendations  MRI brain without contrast-stroke workup to be completed only if MRI reveals an acute stroke. LTM EEG Check urinalysis and chest x-ray to make sure there is no underlying infection that may be causing encephalopathy ______________________________________________________________________   Signed, Jorene Last, NP [facility]  NEUROHOSPITALIST ADDENDUM Performed a face to face diagnostic evaluation.   I have reviewed the contents of history and physical exam as documented by PA/ARNP/Resident and agree with above documentation.  I have discussed and formulated the above plan as documented. Edits to the note have been made as needed.  Impression/Key exam  findings/Plan: given persistent confusion, will put him up on LTM EEG.  Marycatherine Maniscalco, MD [facility] [phone]   If 7pm to 7am, please call on call as listed on AMION.

## 2024-02-05 ENCOUNTER — Ambulatory Visit

## 2024-02-05 ENCOUNTER — Other Ambulatory Visit: Payer: Self-pay | Admitting: Neurology

## 2024-02-05 ENCOUNTER — Inpatient Hospital Stay (HOSPITAL_COMMUNITY)

## 2024-02-05 DIAGNOSIS — R569 Unspecified convulsions: Secondary | ICD-10-CM

## 2024-02-05 DIAGNOSIS — R410 Disorientation, unspecified: Secondary | ICD-10-CM | POA: Diagnosis not present

## 2024-02-05 LAB — CBC
HCT: 41.3 % (ref 39.0–52.0)
Hemoglobin: 14.2 g/dL (ref 13.0–17.0)
MCH: 30.1 pg (ref 26.0–34.0)
MCHC: 34.4 g/dL (ref 30.0–36.0)
MCV: 87.5 fL (ref 80.0–100.0)
Platelets: 189 K/uL (ref 150–400)
RBC: 4.72 MIL/uL (ref 4.22–5.81)
RDW: 14.7 % (ref 11.5–15.5)
WBC: 5.5 K/uL (ref 4.0–10.5)
nRBC: 0 % (ref 0.0–0.2)

## 2024-02-05 LAB — MAGNESIUM: Magnesium: 2 mg/dL (ref 1.7–2.4)

## 2024-02-05 LAB — BASIC METABOLIC PANEL WITH GFR
Anion gap: 9 (ref 5–15)
BUN: 7 mg/dL — ABNORMAL LOW (ref 8–23)
CO2: 27 mmol/L (ref 22–32)
Calcium: 8.9 mg/dL (ref 8.9–10.3)
Chloride: 105 mmol/L (ref 98–111)
Creatinine, Ser: 0.56 mg/dL — ABNORMAL LOW (ref 0.61–1.24)
GFR, Estimated: >60 mL/min (ref >60)
Glucose, Bld: 114 mg/dL — ABNORMAL HIGH (ref 70–99)
Potassium: 3.3 mmol/L — ABNORMAL LOW (ref 3.5–5.1)
Sodium: 141 mmol/L (ref 135–145)

## 2024-02-05 MED ORDER — POTASSIUM CHLORIDE CRYS ER 20 MEQ PO TBCR
40.0000 meq | EXTENDED_RELEASE_TABLET | Freq: Four times a day (QID) | ORAL | Status: AC
  Administered 2024-02-05 (×2): 40 meq via ORAL
  Filled 2024-02-05 (×2): qty 2

## 2024-02-05 MED ORDER — HYDRALAZINE HCL 20 MG/ML IJ SOLN
5.0000 mg | Freq: Once | INTRAMUSCULAR | Status: AC
  Administered 2024-02-05: 5 mg via INTRAVENOUS
  Filled 2024-02-05: qty 1

## 2024-02-05 MED ORDER — TRAZODONE HCL 50 MG PO TABS
50.0000 mg | ORAL_TABLET | Freq: Once | ORAL | Status: AC
  Administered 2024-02-05: 50 mg via ORAL
  Filled 2024-02-05: qty 1

## 2024-02-05 MED ORDER — CYANOCOBALAMIN 1000 MCG PO TABS: 1000.0000 ug | ORAL_TABLET | Freq: Every day | ORAL | Status: AC

## 2024-02-05 MED ORDER — METOPROLOL TARTRATE 50 MG PO TABS: 25.0000 mg | ORAL_TABLET | Freq: Two times a day (BID) | ORAL | Status: DC

## 2024-02-05 NOTE — Plan of Care (Signed)
  Problem: Education: Goal: Knowledge of General Education information will improve Description: Including pain rating scale, medication(s)/side effects and non-pharmacologic comfort measures 02/05/2024 1506 by Lynnette Cena CROME, RN Outcome: Adequate for Discharge 02/05/2024 0819 by Lynnette Cena CROME, RN Outcome: Progressing   Problem: Health Behavior/Discharge Planning: Goal: Ability to manage health-related needs will improve Outcome: Adequate for Discharge   Problem: Clinical Measurements: Goal: Ability to maintain clinical measurements within normal limits will improve Outcome: Adequate for Discharge Goal: Will remain free from infection Outcome: Adequate for Discharge Goal: Diagnostic test results will improve Outcome: Adequate for Discharge Goal: Respiratory complications will improve Outcome: Adequate for Discharge Goal: Cardiovascular complication will be avoided Outcome: Adequate for Discharge   Problem: Activity: Goal: Risk for activity intolerance will decrease Outcome: Adequate for Discharge   Problem: Nutrition: Goal: Adequate nutrition will be maintained Outcome: Adequate for Discharge   Problem: Coping: Goal: Level of anxiety will decrease Outcome: Adequate for Discharge   Problem: Elimination: Goal: Will not experience complications related to bowel motility Outcome: Adequate for Discharge Goal: Will not experience complications related to urinary retention Outcome: Adequate for Discharge   Problem: Pain Managment: Goal: General experience of comfort will improve and/or be controlled Outcome: Adequate for Discharge   Problem: Safety: Goal: Ability to remain free from injury will improve Outcome: Adequate for Discharge   Problem: Safety: Goal: Ability to remain free from injury will improve Outcome: Adequate for Discharge   Problem: Skin Integrity: Goal: Risk for impaired skin integrity will decrease Outcome: Adequate for Discharge   Problem:  Acute Rehab PT Goals(only PT should resolve) Goal: PT Additional Goal #1 Outcome: Adequate for Discharge Goal: PT Additional Goal #2 Outcome: Adequate for Discharge   Problem: Acute Rehab PT Goals(only PT should resolve) Goal: PT Additional Goal #1 Outcome: Adequate for Discharge Goal: PT Additional Goal #2 Outcome: Adequate for Discharge

## 2024-02-05 NOTE — Progress Notes (Signed)
 NEUROLOGY CONSULT FOLLOW UP NOTE   Date of service: February 05, 2024 Patient Name: James Ibarra MRN:  [id number] DOB:  1950-07-13  Interval Hx/subjective  Patient remains hemodynamically stable and afebrile overnight mental status much improved this morning LTM EEG was negative for seizure activity. Vitals   Vitals:   02/05/24 0400 02/05/24 0611 02/05/24 0750 02/05/24 0751  BP: (!) 154/101 (!) 156/97 (!) 175/91   Pulse: 60   64  Resp: 20  15 14   Temp: 98.3 F (36.8 C)   98.1 F (36.7 C)  TempSrc: Oral   Oral  SpO2:    95%  Weight:      Height:         Body mass index is 20.94 kg/m.  Physical Exam   Constitutional: Appears well-developed and well-nourished.  Psych: Affect appropriate to situation.  Eyes: No scleral injection.  HENT: No OP obstrucion.  Head: Normocephalic.  Cardiovascular: Normal rate and regular rhythm.  Respiratory: Effort normal, non-labored breathing.  Skin: WDI.   Neurologic Examination    NEURO:  Mental Status: Patient is able to state name, states he is in the hospital but gets the wrong hospital, able to state today's date, month, able to do simple calculations Speech/Language: speech is without dysarthria or aphasia.    Cranial Nerves:  II: PERRL.  III, IV, VI: EOMI. Eyelids elevate symmetrically.  V: Sensation is intact to light touch and symmetrical to face.  VII: Smile is symmetrical.  VIII: hearing intact to voice. IX, X:Phonation is normal.  KP:Dynloizm shrug 5/5. XII: tongue is midline without fasciculations. Motor: Able to move all 4 extremities with antigravity strength Tone: is normal and bulk is normal Sensation- Intact to light touch bilaterally.  Coordination: FTN intact bilaterally Gait- deferred   Medications  Current Facility-Administered Medications:    acetaminophen  (TYLENOL ) tablet 650 mg, 650 mg, Oral, Q6H PRN **OR** acetaminophen  (TYLENOL ) suppository 650 mg, 650 mg, Rectal, Q6H PRN, Howerter, Justin B, DO    amLODipine  (NORVASC ) tablet 2.5 mg, 2.5 mg, Oral, q AM, Vann, Jessica U, DO, 2.5 mg at 02/05/24 0611   aspirin  EC tablet 81 mg, 81 mg, Oral, Daily, Howerter, Justin B, DO, 81 mg at 02/05/24 9143   cyanocobalamin  (VITAMIN B12) tablet 1,000 mcg, 1,000 mcg, Oral, Daily, Vann, Jessica U, DO, 1,000 mcg at 02/05/24 9142   irbesartan  (AVAPRO ) tablet 150 mg, 150 mg, Oral, Daily, Howerter, Justin B, DO, 150 mg at 02/05/24 0856   melatonin tablet 3 mg, 3 mg, Oral, QHS PRN, Howerter, Justin B, DO, 3 mg at 02/04/24 1946   ondansetron  (ZOFRAN ) injection 4 mg, 4 mg, Intravenous, Q6H PRN, Howerter, Justin B, DO   potassium chloride  SA (KLOR-CON  M) CR tablet 40 mEq, 40 mEq, Oral, Q6H, Elgergawy, Brayton RAMAN, MD, 40 mEq at 02/05/24 0856   rosuvastatin  (CRESTOR ) tablet 20 mg, 20 mg, Oral, Daily, Howerter, Justin B, DO, 20 mg at 02/05/24 0856  Labs and Diagnostic Imaging   CBC:  Recent Labs  Lab 02/03/24 2155 02/03/24 2203 02/04/24 0429 02/05/24 0349  WBC 10.8*  --  8.0 5.5  NEUTROABS 9.7*  --  7.2  --   HGB 14.4   < > 13.5 14.2  HCT 43.4   < > 40.0 41.3  MCV 88.8  --  89.7 87.5  PLT 174  --  183 189   < > = values in this interval not displayed.    Basic Metabolic Panel:  Lab Results  Component Value Date  NA 141 02/05/2024   K 3.3 (L) 02/05/2024   CO2 27 02/05/2024   GLUCOSE 114 (H) 02/05/2024   BUN 7 (L) 02/05/2024   CREATININE 0.56 (L) 02/05/2024   CALCIUM  8.9 02/05/2024   GFRNONAA >60 02/05/2024   GFRAA >90 11/03/2011   Lipid Panel:  Lab Results  Component Value Date   LDLCALC 57 09/06/2023   HgbA1c:  Lab Results  Component Value Date   HGBA1C 5.2 09/06/2023   Urine Drug Screen:     Component Value Date/Time   LABOPIA NONE DETECTED 02/04/2024 0259   COCAINSCRNUR NONE DETECTED 02/04/2024 0259   LABBENZ NONE DETECTED 02/04/2024 0259   AMPHETMU NONE DETECTED 02/04/2024 0259   THCU NONE DETECTED 02/04/2024 0259   LABBARB NONE DETECTED 02/04/2024 0259    Alcohol Level      Component Value Date/Time   [facility] <15 02/03/2024 2155   INR  Lab Results  Component Value Date   INR 1.1 02/03/2024   APTT  Lab Results  Component Value Date   APTT 29 02/03/2024    MRI Brain(Personally reviewed): No acute abnormality  Continuous EEG 12/1-12/2:  This technically difficult study is within normal limits.No seizures or epileptiform discharges were seen throughout the recording.   Assessment   James Ibarra is a 73 y.o. male with history of A-fib status post maze not on anticoagulation, hyperlipidemia, CAD, trigeminal neuralgia, alcohol abuse and stroke who presents with confusion and left-sided dysmetria.  This morning with patient being alert and oriented to person, place, time and situation.  LTM EEG was negative for seizure activity, and urinalysis and chest x-ray revealed no reason for confusion.  MRI is negative for acute abnormality.  As patient has mostly returned to baseline, would pursue further workup on an outpatient basis.  Recommendations  -Discontinue LTM EEG -Recommend outpatient neurologist follow-up - Neurology will sign off ______________________________________________________________________ Patient seen by NP and then by MD, MD to edit note as needed.  Signed, Cortney E Everitt Clint Kill, NP [facility]   NEUROHOSPITALIST ADDENDUM Performed a face to face diagnostic evaluation.   I have reviewed the contents of history and physical exam as documented by PA/ARNP/Resident and agree with above documentation.  I have discussed and formulated the above plan as documented. Edits to the note have been made as needed.  Impression/Key exam findings/Plan: he is back to his baseline. Etiology is still unclear but with him back to his baseline, I think workup can be continued outpatient. Discontinue LTM and okay to discharge from a neuro standpoint.  Dazha Kempa, MD [facility] [phone]   If 7pm to 7am, please call on  call as listed on AMION.

## 2024-02-05 NOTE — Discharge Summary (Signed)
 Physician Discharge Summary  CAROLINE MATTERS FMW:[id number] DOB: 07/23/1950 DOA: 02/03/2024  PCP: Kip Righter, MD  Admit date: 02/03/2024 Discharge date: 02/05/2024  Admitted From: (Home) Disposition:  (Home)  Recommendations for Outpatient Follow-up:  Follow up with PCP in 1-2 weeks Please obtain BMP/CBC in one week Please recheck B12 level in 4 to 6 weeks to ensure improvement on oral supplements   Diet recommendation: Heart Healthy    Brief/Interim Summary:  James Ibarra is a 73 y.o. 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   Acute metabolic encephalopathy -this is most likely in the setting of viral infectious process, as he had low-grade temperature 100.3, and wife at bedside report multiple family members with viral illness. - This has resolved, mental status back to baseline. - Tensive workup during hospital stay very reassuring including MRI brain with no acute CVA, LTM EEG with no evidence of seizure activities, no evidence of infection in UA or chest x-ray, negative urine drug screen -PT/OT consulted, recommendation for outpatient PT -B12 is borderline low, so started on p.o. supplements    History of paroxysmal A-fib - On any anticoagulation anymore as status post LAA clip, and maze procedure in October 2024, and A-fib ablation in March 2025. - No A-fib during hospital stay.  Hypertension - Continue with home medication telmisartan  and amlodipine . - Was noted with bradycardia, but no blocks or arrhythmia or pauses, heart rate in the 50s, so he was instructed to continue Lopressor  at a half  dose 25 mg p.o. twice daily in 2 days  Hypokalemia - Replaced  Hyperlipidemia - Continue with home statin  Alcohol  abuse - History of quit in June 2025, continue with thiamine , and folic acid    Discharge Diagnoses:  Principal Problem:   Acute encephalopathy Active Problems:   HLD (hyperlipidemia)   Paroxysmal atrial fibrillation (HCC)   History of essential hypertension   Hypokalemia    Discharge Instructions  Discharge Instructions     Diet - low sodium heart healthy   Complete by: As directed    Discharge instructions   Complete by: As directed    Follow with Primary MD Kip Righter, MD in 7 days   Get CBC, CMP, 2 view Chest X ray checked  by Primary MD next visit.    Activity: As tolerated with Full fall precautions use walker/cane & assistance as needed   Disposition Home    Diet: Heart Healthy   On your next visit with your primary care physician please Get Medicines reviewed and adjusted.   Please request your Prim.MD to go over all Hospital Tests and Procedure/Radiological results at the follow up, please get all Hospital records sent to your Prim MD by signing hospital release before you go home.   If you experience worsening of your admission symptoms, develop shortness of breath, life threatening emergency, suicidal or homicidal thoughts you must seek medical attention immediately by calling 911 or calling your MD immediately  if symptoms less severe.  You Must read complete instructions/literature along with all the possible adverse reactions/side effects for all the Medicines you take and that have been prescribed to you. Take any new Medicines after you have completely  understood and accpet all the possible adverse reactions/side effects.   Do not drive, operating heavy machinery, perform activities at heights, swimming or participation in water  activities or provide baby sitting services if your were admitted for syncope or siezures until you have  seen by Primary MD or a Neurologist and advised to do so again.  Do not drive when taking Pain medications.    Do not take more than prescribed Pain, Sleep and Anxiety Medications  Special Instructions: If you have smoked or chewed Tobacco  in the last 2 yrs please stop smoking, stop any regular Alcohol  and or any Recreational drug use.  Wear Seat belts while driving.   Please note  You were cared for by a hospitalist during your hospital stay. If you have any questions about your discharge medications or the care you received while you were in the hospital after you are discharged, you can call the unit and asked to speak with the hospitalist on call if the hospitalist that took care of you is not available. Once you are discharged, your primary care physician will handle any further medical issues. Please note that NO REFILLS for any discharge medications will be authorized once you are discharged, as it is imperative that you return to your primary care physician (or establish a relationship with a primary care physician if you do not have one) for your aftercare needs so that they can reassess your need for medications and monitor your lab values.   Increase activity slowly   Complete by: As directed       Allergies as of 02/05/2024   No Known Allergies      Medication List     STOP taking these medications    olmesartan 40 MG tablet Commonly known as: BENICAR       TAKE these medications    acetaminophen  325 MG tablet Commonly known as: TYLENOL  Take 2 tablets (650 mg total) by mouth every 6 (six) hours. What changed:  when to take this reasons to take this   amLODipine  2.5 MG tablet Commonly known as: NORVASC  Take 2.5 mg by mouth in the morning.   ascorbic acid 250 MG tablet Commonly known as: VITAMIN C Take 500 mg by mouth daily. Take two  tablets (500mg ) by mouth daily in the morning.   aspirin  EC 81 MG tablet Take 1 tablet (81 mg total) by mouth daily.  Swallow whole.   cyanocobalamin  1000 MCG tablet Take 1 tablet (1,000 mcg total) by mouth daily. Start taking on: February 06, 2024   fluorouracil 5 % cream Commonly known as: EFUDEX Apply 1 Application topically 2 (two) times daily.   folic acid  1 MG tablet Commonly known as: FOLVITE  Take 1 mg by mouth daily.   gabapentin  300 MG capsule Commonly known as: NEURONTIN  TAKE 2 CAPSULES BY MOUTH 4 TIMES DAILY   hydrocortisone 2.5 % ointment Apply 1 Application topically 2 (two) times daily as needed (itching).   Magnesium  400 MG Tabs Take 400 mg by mouth every evening.   melatonin 3 MG Tabs tablet Take 3 mg by mouth at bedtime as needed (sleep).   metoprolol  tartrate 50 MG tablet Commonly known as: LOPRESSOR  Take 0.5 tablets (25 mg total) by mouth 2 (two) times daily. Start taking on: February 07, 2024 What changed:  how much to take These instructions start on February 07, 2024. If you are unsure what to do until then, ask your doctor or other care provider.   PreserVision AREDS  2 Caps Take 1 capsule by mouth 2 (two) times daily.   rosuvastatin  20 MG tablet Commonly known as: CRESTOR  Take 1 tablet (20 mg total) by mouth daily.   sildenafil 20 MG tablet Commonly known as: REVATIO Take 40-60 mg by mouth daily as needed (ED).   SYSTANE OP Place 1 drop into both eyes daily as needed (dry eyes).   telmisartan  40 MG tablet Commonly known as: MICARDIS  Take 1 tablet (40 mg total) by mouth daily.   thiamine  100 MG tablet Commonly known as: Vitamin B-1 Take 100 mg by mouth daily.   traZODone  100 MG tablet Commonly known as: DESYREL  Take 100 mg by mouth at bedtime.   Vitamin D 50 MCG (2000 UT) tablet Take 2,000 Units by mouth daily.        No Known Allergies  Consultations: Neurology   Procedures/Studies: Overnight EEG with video Result Date: 02/05/2024 Shelton Arlin KIDD, MD     02/05/2024  6:42 AM Patient Name: James Ibarra MRN: [id number] Epilepsy  Attending: Arlin KIDD Shelton Referring Physician/Provider: Khaliqdina, Salman, MD Duration: 02/04/2024 0829 to 02/05/2024 0630 Patient history: 73 y.o. male brought in for evaluation of confusion and possible left-sided ataxia. EEG to evaluate for seizure Level of alertness: Awake, asleep AEDs during EEG study: None Technical aspects: This EEG study was done with scalp electrodes positioned according to the 10-20 International system of electrode placement. Electrical activity was reviewed with band pass filter of 1-70Hz , sensitivity of 7 uV/mm, display speed of 40mm/sec with a 60Hz  notched filter applied as appropriate. EEG data were recorded continuously and digitally stored.  Video monitoring was available and reviewed as appropriate. Description: The posterior dominant rhythm consists of 8-9 Hz activity of moderate voltage (25-35 uV) seen predominantly in posterior head regions, symmetric and reactive to eye opening and eye closing. Sleep was characterized by vertex waves, sleep spindles (12 to 14 Hz), maximal frontocentral region. Hyperventilation and photic stimulation were not performed.   EEG was disconnected between 02/04/2024 1343 to 1457. After 2211 on 02/04/2024, parts of EEG were difficult to interpret due to significant electrode artifact. IMPRESSION: This technically difficult study is within normal limits.No seizures or epileptiform discharges were seen throughout the recording. Arlin KIDD Shelton   DG Chest Port 1 View Result Date: 02/04/2024 CLINICAL DATA:  Acute encephalopathy. EXAM: PORTABLE CHEST 1 VIEW COMPARISON:  09/04/2023 FINDINGS: Postoperative changes compatible with previous mitral valve surgery. No pulmonary edema. Bandlike densities in the right lower lung are suggestive for atelectasis. Remainder the lungs are clear. Heart size is upper limits of normal but stable. Negative for a pneumothorax. IMPRESSION: 1. Bandlike densities in the right lower lung are suggestive for atelectasis.  Electronically Signed   By: Juliene Balder M.D.   On: 02/04/2024 09:29   MR BRAIN WO CONTRAST Result Date: 02/04/2024 EXAM: MRI BRAIN WITHOUT CONTRAST 02/04/2024 07:56:52 AM TECHNIQUE: Multiplanar multisequence MRI of the head/brain was performed without the administration of intravenous contrast. COMPARISON: MRI of the head dated 11/11/2023. CLINICAL HISTORY: Delirium. FINDINGS: BRAIN AND VENTRICLES: No acute infarct. No intracranial hemorrhage. No mass. No midline shift. No hydrocephalus. Interval resolution of restricted diffusion within the left hippocampus. There is moderate diffuse cerebral volume loss again demonstrated and mild-to-moderate periventricular and deep cerebral white matter disease. The sella is unremarkable. Normal flow voids. ORBITS: No acute abnormality. SINUSES AND MASTOIDS: No acute abnormality. BONES AND SOFT TISSUES: Normal marrow signal. No acute soft tissue abnormality. IMPRESSION: 1. Interval resolution of restricted diffusion within the left  hippocampus. 2. Moderate diffuse cerebral volume loss. 3. 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 Electronically signed by: Evalene Coho MD 02/04/2024 08:45 AM EST RP Workstation: [id number]   CT ANGIO HEAD NECK W WO CM W PERF (CODE STROKE) Result Date: 02/03/2024 CLINICAL DATA:  Initial evaluation for acute neuro deficit, stroke suspected. EXAM: CT ANGIOGRAPHY HEAD AND NECK CT PERFUSION BRAIN TECHNIQUE: Multidetector CT imaging of the head and neck was performed using the standard protocol during bolus administration of intravenous contrast. Multiplanar CT image reconstructions and MIPs were obtained to evaluate the vascular anatomy. Carotid stenosis measurements (when applicable) are obtained utilizing NASCET criteria, using the distal internal carotid diameter as the denominator. Multiphase CT imaging of the brain was performed following IV bolus contrast injection. Subsequent parametric perfusion maps  were calculated using RAPID software. RADIATION DOSE REDUCTION: This exam was performed according to the departmental dose-optimization program which includes automated exposure control, adjustment of the mA and/or kV according to patient size and/or use of iterative reconstruction technique. CONTRAST:  OMNIPAQUE  IOHEXOL  350 MG/ML SOLN COMPARISON:  CT from earlier the same day. FINDINGS: CTA NECK FINDINGS Aortic arch: Visualized aortic arch within normal limits for caliber with standard 3 vessel morphology. Mild aortic atherosclerosis. No stenosis about the origin the great vessels. Right carotid system: Right common and internal carotid arteries are tortuous and ectatic but patent without dissection or stenosis. Left carotid system: Left common and internal carotid arteries are tortuous and ectatic but patent without stenosis or dissection. Vertebral arteries: Both vertebral arteries somewhat tortuous and ectatic but patent without stenosis or dissection. Skeleton: No worrisome osseous lesions.  Prior sternotomy noted. Other neck: No other acute finding. Upper chest: Small layering right pleural effusion noted. Associated mild atelectasis within the visualized right lower lobe. No other acute finding. Review of the MIP images confirms the above findings CTA HEAD FINDINGS Anterior circulation: Both internal carotid arteries widely patent to the termini without stenosis. A1 segments widely patent. Normal anterior communicating artery complex. Both anterior cerebral arteries widely patent to their distal aspects without stenosis. No M1 stenosis or occlusion. Normal MCA bifurcations. Distal MCA branches well perfused and symmetric. Posterior circulation: Both V4 segments patent to the vertebrobasilar junction without stenosis. Both PICA origins patent and normal. Basilar widely patent to its distal aspect without stenosis. Superior cerebellar arteries patent bilaterally. Both PCAs primarily supplied via the  basilar and are well perfused to there distal aspects. Venous sinuses: Patent allowing for timing the contrast bolus. Anatomic variants: None significant.  No aneurysm. Review of the MIP images confirms the above findings CT Brain Perfusion Findings: ASPECTS: 10 CBF (<30%) Volume: 0mL Perfusion (Tmax>6.0s) volume: 0mL Mismatch Volume: 0mL Infarction Location:Negative CT perfusion with no evidence for acute ischemia or other perfusion abnormality. IMPRESSION: 1. Negative CTA of the head and neck. No large vessel occlusion or other emergent finding. 2. Negative CT perfusion with no evidence for acute ischemia or other perfusion abnormality. 3. Diffuse tortuosity and ectasia of the major arterial vasculature of the head and neck, suggesting chronic underlying hypertension. 4. Small layering right pleural effusion. Aortic Atherosclerosis (ICD10-I70.0). These results were communicated to Dr. Arora at 10:22 pm on 02/03/2024 by text page via the [facility] messaging system. Electronically Signed   By: Morene Hoard M.D.   On: 02/03/2024 22:27   CT HEAD CODE STROKE WO CONTRAST Result Date: 02/03/2024 CLINICAL DATA:  Code stroke. Initial evaluation for acute neuro deficit, stroke suspected, left-sided weakness. EXAM: CT HEAD WITHOUT CONTRAST TECHNIQUE:  Contiguous axial images were obtained from the base of the skull through the vertex without intravenous contrast. RADIATION DOSE REDUCTION: This exam was performed according to the departmental dose-optimization program which includes automated exposure control, adjustment of the mA and/or kV according to patient size and/or use of iterative reconstruction technique. COMPARISON:  Prior study from 09/06/2023. FINDINGS: Brain: Cerebral volume within normal limits. Mild to moderate chronic microvascular ischemic disease. No acute intracranial hemorrhage. No acute large vessel territory infarct. No mass lesion or midline shift. No hydrocephalus or extra-axial fluid  collection. 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 Vascular: No abnormal hyperdense vessel. Calcified atherosclerosis present at skull base. Skull: Scalp soft tissues within normal limits.  Calvarium intact. Sinuses/Orbits: Globes orbital soft tissues demonstrate no acute finding. Prior scleral buckle on the left. Paranasal sinuses and mastoid air cells are largely clear. Other: None. ASPECTS [facility] Stroke Program Early CT Score) - Ganglionic level infarction (caudate, lentiform nuclei, internal capsule, insula, M1-M3 cortex): 7 - Supraganglionic infarction (M4-M6 cortex): 3 Total score (0-10 with 10 being normal): 10 IMPRESSION: 1. No acute intracranial abnormality. 2. ASPECTS is 10. 3. Mild-to-moderate chronic microvascular ischemic disease. These results were communicated to Dr. Arora at 10:10 pm on 02/03/2024 by text page via the [facility] messaging system. Electronically Signed   By: Morene Hoard M.D.   On: 02/03/2024 22:12   MR LUMBAR SPINE WO CONTRAST Result Date: 01/29/2024  [facility] [address] [place], [place] [address] [phone] NEUROIMAGING REPORT STUDY DATE: 01/29/2024 PATIENT NAME: James Ibarra DOB: June 19, 1950 MRN: [id number] ORDERING CLINICIAN: Dr. Onita CLINICAL HISTORY: 73 year old patient being evaluated for chronic low back pain and gait abnormality. COMPARISON FILMS: None available EXAM: MRI lumbar spine without contrast TECHNIQUE: Sagittal T1, T2, STIR and axial T1 and T2 images were obtained through the lumbar spine CONTRAST: None IMAGING SITE: Fertile imaging FINDINGS: The lumbar vertebrae demonstrate loss of forward lordotic curvature and scoliosis to the left.  The vertebral body heights and marrow signal appear normal.  There are prominent disc degenerative changes noted throughout. L1-L2 shows mild disc and facet degenerative changes with mild posterior canal narrowing but no significant root or foraminal encroachment. L2-3  shows mild disc signal abnormality and prominent facet degenerative change with some synovitis fluid accumulation and mild posterior canal narrowing.  No significant foraminal narrowing or root impingement. L3-4 also shows minor disc and prominent facet degenerative changes but no significant compression. L4-5 also shows minor disc and prominent facet degenerative changes resulting in mild right-sided foraminal narrowing but no definite nerve root compression. L5/S1 also shows minor disc facet degenerative changes with no significant compression. The conus medullaris terminates at L1.  Paraspinal soft tissue appear unremarkable.  Visualized portion of lower thoracic vertebrae also show minor disc degenerative changes.   MRI scan of the lumbar spine showing mild disc and prominent facet degenerative changes most prominent at L4-5 where there is mild right-sided foraminal narrowing. INTERPRETING PHYSICIAN: EATHER POPP, MD Certified in  Neuroimaging by American Society of Neuroimaging and [employer]     Subjective: No significant events overnight, he denies any complaints this morning  Discussed with wife at bedside  Discharge Exam: Vitals:   02/05/24 0751 02/05/24 1335  BP:  139/82  Pulse: 64 72  Resp: 14 18  Temp: 98.1 F (36.7 C)   SpO2: 95% 97%   Vitals:   02/05/24 0611 02/05/24 0750 02/05/24 0751 02/05/24 1335  BP: (!) 156/97 (!) 175/91  139/82  Pulse:   64 72  Resp:  15 14 18   Temp:   98.1 F (36.7 C)   TempSrc:   Oral   SpO2:   95% 97%  Weight:      Height:        General: Pt is alert, awake, not in acute distress Cardiovascular: RRR, S1/S2 +, no rubs, no gallops Respiratory: CTA bilaterally, no wheezing, no rhonchi Abdominal: Soft, NT, ND, bowel sounds + Extremities: no edema, no cyanosis    The results of significant diagnostics from this hospitalization (including imaging, microbiology, ancillary and laboratory) are listed below  for reference.     Microbiology: Recent Results (from the past 240 hours)  Resp panel by RT-PCR (RSV, Flu A&B, Covid) Anterior Nasal Swab     Status: None   Collection Time: 02/03/24 10:41 PM   Specimen: Anterior Nasal Swab  Result Value Ref Range Status   SARS Coronavirus 2 by RT PCR NEGATIVE NEGATIVE Final   Influenza A by PCR NEGATIVE NEGATIVE Final   Influenza B by PCR NEGATIVE NEGATIVE Final    Comment: (NOTE) The Xpert Xpress SARS-CoV-2/FLU/RSV plus assay is intended as an aid in the diagnosis of influenza from Nasopharyngeal swab specimens and should not be used as a sole basis for treatment. Nasal washings and aspirates are unacceptable for Xpert Xpress SARS-CoV-2/FLU/RSV testing.  Fact Sheet for Patients: bloggercourse.com  Fact Sheet for Healthcare Providers: seriousbroker.it  This test is not yet approved or cleared by the [place]  FDA and has been authorized for detection and/or diagnosis of SARS-CoV-2 by FDA under an Emergency Use Authorization (EUA). This EUA will remain in effect (meaning this test can be used) for the duration of the COVID-19 declaration under Section 564(b)(1) of the Act, 21 U.S.C. section 360bbb-3(b)(1), unless the authorization is terminated or revoked.     Resp Syncytial Virus by PCR NEGATIVE NEGATIVE Final    Comment: (NOTE) Fact Sheet for Patients: bloggercourse.com  Fact Sheet for Healthcare Providers: seriousbroker.it  This test is not yet approved or cleared by the [place]  FDA and has been authorized for detection and/or diagnosis of SARS-CoV-2 by FDA under an Emergency Use Authorization (EUA). This EUA will remain in effect (meaning this test can be used) for the duration of the COVID-19 declaration under Section 564(b)(1) of the Act, 21 U.S.C. section 360bbb-3(b)(1), unless the authorization is terminated  or revoked.  Performed at [facility], [address] [place], [place] [address]   Culture, blood (Routine X 2) w Reflex to ID Panel     Status: None (Preliminary result)   Collection Time: 02/04/24  2:56 PM   Specimen: BLOOD  Result Value Ref Range Status   Specimen Description BLOOD SITE NOT SPECIFIED  Final   Special Requests   Final    BOTTLES DRAWN AEROBIC AND ANAEROBIC Blood Culture results may not be optimal due to an inadequate volume of blood received in culture bottles   Culture   Final    NO GROWTH < 24 HOURS Performed at [facility], [address] [place], [place] [address]    Report Status PENDING  Incomplete  Culture, blood (Routine X 2) w Reflex to ID Panel     Status: None (Preliminary result)   Collection Time: 02/04/24  3:06 PM   Specimen: BLOOD RIGHT HAND  Result Value Ref Range Status   Specimen Description BLOOD RIGHT HAND  Final   Special Requests   Final  BOTTLES DRAWN AEROBIC ONLY Blood Culture results may not be optimal due to an inadequate volume of blood received in culture bottles   Culture   Final    NO GROWTH < 24 HOURS Performed at [facility], [address] [place], [place] [address]    Report Status PENDING  Incomplete     Labs: BNP (last 3 results) No results for input(s): BNP in the last 8760 hours. Basic Metabolic Panel: Recent Labs  Lab 02/03/24 2155 02/03/24 2203 02/03/24 2231 02/04/24 0429 02/05/24 0349 02/05/24 0415  NA 139 141  --  136 141  --   K 3.3* 3.4*  --  3.1* 3.3*  --   CL 103 100  --  99 105  --   CO2 26  --   --  26 27  --   GLUCOSE 106* 105*  --  113* 114*  --   BUN 12 13  --  12 7*  --   CREATININE 0.72 0.70  --  0.68 0.56*  --   CALCIUM  8.8*  --   --  8.5* 8.9  --   MG  --   --  1.8 1.7  --  2.0   Liver Function Tests: Recent Labs  Lab 02/03/24 2155 02/04/24 0429  AST 21 20  ALT 16 13  ALKPHOS 53 50  BILITOT 1.5* 1.4*  PROT 6.2* 5.7*  ALBUMIN  3.5 3.1*   No results for  input(s): LIPASE, AMYLASE in the last 168 hours. No results for input(s): AMMONIA in the last 168 hours. CBC: Recent Labs  Lab 02/03/24 2155 02/03/24 2203 02/04/24 0429 02/05/24 0349  WBC 10.8*  --  8.0 5.5  NEUTROABS 9.7*  --  7.2  --   HGB 14.4 15.0 13.5 14.2  HCT 43.4 44.0 40.0 41.3  MCV 88.8  --  89.7 87.5  PLT 174  --  183 189   Cardiac Enzymes: Recent Labs  Lab 02/03/24 2231  CKTOTAL 93   BNP: Invalid input(s): POCBNP CBG: Recent Labs  Lab 02/03/24 2154  GLUCAP 102*   D-Dimer No results for input(s): DDIMER in the last 72 hours. Hgb A1c No results for input(s): HGBA1C in the last 72 hours. Lipid Profile No results for input(s): CHOL, HDL, LDLCALC, TRIG, CHOLHDL, LDLDIRECT in the last 72 hours. Thyroid  function studies Recent Labs    02/03/24 2231  TSH 0.624   Anemia work up Recent Labs    02/03/24 2231  VITAMINB12 240   Urinalysis    Component Value Date/Time   COLORURINE YELLOW 02/04/2024 0259   APPEARANCEUR CLEAR 02/04/2024 0259   LABSPEC 1.010 02/04/2024 0259   PHURINE 7.5 02/04/2024 0259   GLUCOSEU NEGATIVE 02/04/2024 0259   HGBUR LARGE (A) 02/04/2024 0259   BILIRUBINUR NEGATIVE 02/04/2024 0259   KETONESUR NEGATIVE 02/04/2024 0259   PROTEINUR NEGATIVE 02/04/2024 0259   NITRITE NEGATIVE 02/04/2024 0259   LEUKOCYTESUR NEGATIVE 02/04/2024 0259   Sepsis Labs Recent Labs  Lab 02/03/24 2155 02/04/24 0429 02/05/24 0349  WBC 10.8* 8.0 5.5   Microbiology Recent Results (from the past 240 hours)  Resp panel by RT-PCR (RSV, Flu A&B, Covid) Anterior Nasal Swab     Status: None   Collection Time: 02/03/24 10:41 PM   Specimen: Anterior Nasal Swab  Result Value Ref Range Status   SARS Coronavirus 2 by RT PCR NEGATIVE NEGATIVE Final   Influenza A by PCR NEGATIVE NEGATIVE Final   Influenza B by PCR NEGATIVE NEGATIVE Final  Comment: (NOTE) The Xpert Xpress SARS-CoV-2/FLU/RSV plus assay is intended as an aid in the  diagnosis of influenza from Nasopharyngeal swab specimens and should not be used as a sole basis for treatment. Nasal washings and aspirates are unacceptable for Xpert Xpress SARS-CoV-2/FLU/RSV testing.  Fact Sheet for Patients: bloggercourse.com  Fact Sheet for Healthcare Providers: seriousbroker.it  This test is not yet approved or cleared by the [place]  FDA and has been authorized for detection and/or diagnosis of SARS-CoV-2 by FDA under an Emergency Use Authorization (EUA). This EUA will remain in effect (meaning this test can be used) for the duration of the COVID-19 declaration under Section 564(b)(1) of the Act, 21 U.S.C. section 360bbb-3(b)(1), unless the authorization is terminated or revoked.     Resp Syncytial Virus by PCR NEGATIVE NEGATIVE Final    Comment: (NOTE) Fact Sheet for Patients: bloggercourse.com  Fact Sheet for Healthcare Providers: seriousbroker.it  This test is not yet approved or cleared by the [place]  FDA and has been authorized for detection and/or diagnosis of SARS-CoV-2 by FDA under an Emergency Use Authorization (EUA). This EUA will remain in effect (meaning this test can be used) for the duration of the COVID-19 declaration under Section 564(b)(1) of the Act, 21 U.S.C. section 360bbb-3(b)(1), unless the authorization is terminated or revoked.  Performed at [facility], [address] [place], [place] [address]   Culture, blood (Routine X 2) w Reflex to ID Panel     Status: None (Preliminary result)   Collection Time: 02/04/24  2:56 PM   Specimen: BLOOD  Result Value Ref Range Status   Specimen Description BLOOD SITE NOT SPECIFIED  Final   Special Requests   Final    BOTTLES DRAWN AEROBIC AND ANAEROBIC Blood Culture results may not be optimal due to an inadequate volume of blood received in culture bottles   Culture   Final     NO GROWTH < 24 HOURS Performed at [facility], [address] [place], [place] [address]    Report Status PENDING  Incomplete  Culture, blood (Routine X 2) w Reflex to ID Panel     Status: None (Preliminary result)   Collection Time: 02/04/24  3:06 PM   Specimen: BLOOD RIGHT HAND  Result Value Ref Range Status   Specimen Description BLOOD RIGHT HAND  Final   Special Requests   Final    BOTTLES DRAWN AEROBIC ONLY Blood Culture results may not be optimal due to an inadequate volume of blood received in culture bottles   Culture   Final    NO GROWTH < 24 HOURS Performed at [facility], [address], [place], [place] [address]    Report Status PENDING  Incomplete     Time coordinating discharge: Over 30 minutes  SIGNED:   Brayton Lye, MD  [facility] 02/05/2024, 2:40 PM Pager   If 7PM-7AM, please contact night-coverage [email or web address] Password [email or web address]

## 2024-02-05 NOTE — Progress Notes (Signed)
 Physical Therapy Quick Note  PT has completed initial evaluation.    Overall, patient at supervision assistance level.   PT Follow up recommended: Outpatient PT Equipment recommended:  None recommended Complete evaluation note to follow.     Bernardino JINNY Ruth, PT, DPT Acute Rehabilitation Office [phone]

## 2024-02-05 NOTE — Plan of Care (Signed)
   Problem: Education: Goal: Knowledge of General Education information will improve Description: Including pain rating scale, medication(s)/side effects and non-pharmacologic comfort measures Outcome: Progressing   Problem: Health Behavior/Discharge Planning: Goal: Ability to manage health-related needs will improve Outcome: Progressing   Problem: Clinical Measurements: Goal: Ability to maintain clinical measurements within normal limits will improve Outcome: Progressing   Problem: Activity: Goal: Risk for activity intolerance will decrease Outcome: Progressing   Problem: Coping: Goal: Level of anxiety will decrease Outcome: Progressing   Problem: Safety: Goal: Ability to remain free from injury will improve Outcome: Progressing

## 2024-02-05 NOTE — TOC Transition Note (Signed)
 Transition of Care [facility]) - Discharge Note   Patient Details  Name: James Ibarra MRN: [id number] Date of Birth: 1950-07-11  Transition of Care [facility]) CM/SW Contact:  Landry DELENA Senters, RN Phone Number: 02/05/2024, 1:31 PM   Clinical Narrative:     Patient lives at home with wife,who will be transporting patient home at d/c. Patient has PCP, manages medications with wife's assistance, they already have needed DME.  Patient is scheduled to start outpatient therapy at [facility]. This was arranged previously due to drop foot related problems. CM did contact [facility] and this is set up for patient.     Final next level of care: OP Rehab Barriers to Discharge: No Barriers Identified   Patient Goals and CMS Choice            Discharge Placement                       Discharge Plan and Services Additional resources added to the After Visit Summary for                                       Social Drivers of Health (SDOH) Interventions SDOH Screenings   Food Insecurity: No Food Insecurity (02/04/2024)  Housing: Low Risk  (02/04/2024)  Transportation Needs: No Transportation Needs (02/04/2024)  Utilities: Not At Risk (02/04/2024)  Depression (PHQ2-9): Medium Risk (10/09/2023)  Social Connections: Unknown (02/04/2024)  Tobacco Use: Low Risk  (02/03/2024)     Readmission Risk Interventions    12/14/2022   11:51 AM  Readmission Risk Prevention Plan  Transportation Screening Complete  PCP or Specialist Appt within 5-7 Days --  Home Care Screening Complete  Medication Review (RN CM) Complete

## 2024-02-05 NOTE — Progress Notes (Signed)
 Patient is confused and a bit agitated. Tried to pull out his EEG wires. Called EEG tech about his wires coming off but no response. Left a voicemail.

## 2024-02-05 NOTE — TOC Transition Note (Addendum)
 Transition of Care [facility]) - Discharge Note   Patient Details  Name: James Ibarra MRN: [id number] Date of Birth: [birth year]  Transition of Care [facility]) CM/SW Contact:  Landry DELENA Senters, RN Phone Number: 02/05/2024, 2:44 PM   Clinical Narrative:    Patient will d/c to home with wife, who is providing transportation. Patient is already set up with OP therapy at [facility]. CM did call to verify they don't need any new orders since patient missed appt today and they report they will call to reschedule patient.  No further needs identified by therapy.   No further needs identified by CM.   Final next level of care: OP Rehab Barriers to Discharge: No Barriers Identified   Patient Goals and CMS Choice            Discharge Placement                       Discharge Plan and Services Additional resources added to the After Visit Summary for                                       Social Drivers of Health (SDOH) Interventions SDOH Screenings   Food Insecurity: No Food Insecurity (02/04/2024)  Housing: Low Risk  (02/04/2024)  Transportation Needs: No Transportation Needs (02/04/2024)  Utilities: Not At Risk (02/04/2024)  Depression (PHQ2-9): Medium Risk (10/09/2023)  Social Connections: Unknown (02/04/2024)  Tobacco Use: Low Risk  (02/03/2024)     Readmission Risk Interventions    12/14/2022   11:51 AM  Readmission Risk Prevention Plan  Transportation Screening Complete  PCP or Specialist Appt within 5-7 Days --  Home Care Screening Complete  Medication Review (RN CM) Complete

## 2024-02-05 NOTE — Discharge Instructions (Signed)
Follow with Primary MD Farris Has, MD in 7 days   Get CBC, CMP, 2 view Chest X ray checked  by Primary MD next visit.    Activity: As tolerated with Full fall precautions use walker/cane & assistance as needed   Disposition Home    Diet: Heart Healthy   On your next visit with your primary care physician please Get Medicines reviewed and adjusted.   Please request your Prim.MD to go over all Hospital Tests and Procedure/Radiological results at the follow up, please get all Hospital records sent to your Prim MD by signing hospital release before you go home.   If you experience worsening of your admission symptoms, develop shortness of breath, life threatening emergency, suicidal or homicidal thoughts you must seek medical attention immediately by calling 911 or calling your MD immediately  if symptoms less severe.  You Must read complete instructions/literature along with all the possible adverse reactions/side effects for all the Medicines you take and that have been prescribed to you. Take any new Medicines after you have completely understood and accpet all the possible adverse reactions/side effects.   Do not drive, operating heavy machinery, perform activities at heights, swimming or participation in water activities or provide baby sitting services if your were admitted for syncope or siezures until you have seen by Primary MD or a Neurologist and advised to do so again.  Do not drive when taking Pain medications.    Do not take more than prescribed Pain, Sleep and Anxiety Medications  Special Instructions: If you have smoked or chewed Tobacco  in the last 2 yrs please stop smoking, stop any regular Alcohol  and or any Recreational drug use.  Wear Seat belts while driving.   Please note  You were cared for by a hospitalist during your hospital stay. If you have any questions about your discharge medications or the care you received while you were in the hospital after  you are discharged, you can call the unit and asked to speak with the hospitalist on call if the hospitalist that took care of you is not available. Once you are discharged, your primary care physician will handle any further medical issues. Please note that NO REFILLS for any discharge medications will be authorized once you are discharged, as it is imperative that you return to your primary care physician (or establish a relationship with a primary care physician if you do not have one) for your aftercare needs so that they can reassess your need for medications and monitor your lab values.

## 2024-02-05 NOTE — Plan of Care (Signed)
   Problem: Education: Goal: Knowledge of General Education information will improve Description Including pain rating scale, medication(s)/side effects and non-pharmacologic comfort measures Outcome: Progressing

## 2024-02-05 NOTE — Progress Notes (Signed)
 OT Cancellation Note  Patient Details Name: James Ibarra MRN: [id number] DOB: 1950/06/20   Cancelled Treatment:    Reason Eval/Treat Not Completed: OT screened, no needs identified, will sign off  [name] 02/05/2024, 2:55 PM 02/05/2024  RP, OTR/L  Acute Rehabilitation Services  Office:  [phone]

## 2024-02-05 NOTE — Progress Notes (Signed)
 LTM VIDEO EEG discontinued - no skin breakdown at [facility].

## 2024-02-05 NOTE — Progress Notes (Signed)
 James Ibarra  to be discharged Home per MD order. Discussed prescriptions and follow up appointments with the patient. Prescriptions given to patient; medication list explained in detail. Patient and wife verbalized understanding.   Skin clean, dry and intact without evidence of skin break down, no evidence of skin tears noted. IV catheter discontinued intact. Site without signs and symptoms of complications. Dressing and pressure applied. Pt denies pain at the site currently. No complaints noted.   Patient free of lines, drains, and wounds.   An After Visit Summary (AVS) was printed and given to the patient. Patient escorted via wheelchair to private car.  Kendrik Mcshan, Cena Helling, RN

## 2024-02-05 NOTE — Procedures (Signed)
 Patient Name: James Ibarra  MRN: [id number]  Epilepsy Attending: Arlin MALVA Krebs  Referring Physician/Provider: Khaliqdina, Salman, MD  Duration: 02/04/2024 9170 to 02/05/2024 1107  Patient history: 73 y.o. male brought in for evaluation of confusion and possible left-sided ataxia. EEG to evaluate for seizure  Level of alertness: Awake, asleep  AEDs during EEG study: None  Technical aspects: This EEG study was done with scalp electrodes positioned according to the 10-20 International system of electrode placement. Electrical activity was reviewed with band pass filter of 1-70Hz , sensitivity of 7 uV/mm, display speed of 31mm/sec with a 60Hz  notched filter applied as appropriate. EEG data were recorded continuously and digitally stored.  Video monitoring was available and reviewed as appropriate.  Description: The posterior dominant rhythm consists of 8-9 Hz activity of moderate voltage (25-35 uV) seen predominantly in posterior head regions, symmetric and reactive to eye opening and eye closing. Sleep was characterized by vertex waves, sleep spindles (12 to 14 Hz), maximal frontocentral region. Hyperventilation and photic stimulation were not performed.     EEG was disconnected between 02/04/2024 1343 to 1457. After 2211 on 02/04/2024, parts of EEG were difficult to interpret due to significant electrode artifact.    IMPRESSION: This technically difficult study is within normal limits.No seizures or epileptiform discharges were seen throughout the recording.  Emmogene Simson O Antonino Nienhuis

## 2024-02-05 NOTE — Evaluation (Signed)
 Physical Therapy Evaluation Patient Details Name: James Ibarra MRN: [id number] DOB: Apr 18, 1950 Today's Date: 02/05/2024  History of Present Illness  73 y.o. male presents to [facility] hospital on 02/03/2024 with AMS. Imaging negative, EEG negative. PMH includes PAF, HTN, PFO s/p closure in 2024.  Clinical Impression  Pt presents to PT mobilizing well. Pt is able to ambulate for household distances and negotiate a flight of stairs without significant loss of balance at this time. Pt has chronic memory deficits and spouse reports some recent balance deficits for which the pt is attending outpatient PT. PT recommends continued outpatient PT at this time.        If plan is discharge home, recommend the following: A little help with bathing/dressing/bathroom;Assistance with cooking/housework;Direct supervision/assist for medications management;Direct supervision/assist for financial management;Supervision due to cognitive status   Can travel by private vehicle        Equipment Recommendations None recommended by PT  Recommendations for Other Services       Functional Status Assessment Patient has had a recent decline in their functional status and demonstrates the ability to make significant improvements in function in a reasonable and predictable amount of time.     Precautions / Restrictions Precautions Precautions: Other (comment) (chronic memory impairment) Recall of Precautions/Restrictions: Intact Restrictions Weight Bearing Restrictions Per Provider Order: No      Mobility  Bed Mobility Overal bed mobility: Modified Independent                  Transfers Overall transfer level: Needs assistance Equipment used: None Transfers: Sit to/from Stand Sit to Stand: Supervision                Ambulation/Gait Ambulation/Gait assistance: Supervision Gait Distance (Feet): 300 Feet Assistive device: None Gait Pattern/deviations: Step-through pattern Gait velocity:  functional Gait velocity interpretation: 1.31 - 2.62 ft/sec, indicative of limited community ambulator   General Gait Details: steady step-through gait  Stairs Stairs: Yes Stairs assistance: Supervision Stair Management: One rail Right, Step to pattern Number of Stairs: 8    Wheelchair Mobility     Tilt Bed    Modified Rankin (Stroke Patients Only)       Balance Overall balance assessment: Needs assistance Sitting-balance support: No upper extremity supported, Feet supported Sitting balance-Leahy Scale: Good     Standing balance support: No upper extremity supported, During functional activity Standing balance-Leahy Scale: Good                               Pertinent Vitals/Pain Pain Assessment Pain Assessment: No/denies pain    Home Living Family/patient expects to be discharged to:: Private residence Living Arrangements: Spouse/significant other Available Help at Discharge: Family;Available 24 hours/day Type of Home: House Home Access: Stairs to enter Entrance Stairs-Rails: Right;Left Entrance Stairs-Number of Steps: 8 Alternate Level Stairs-Number of Steps: flight Home Layout: Multi-level;Able to live on main level with bedroom/bathroom Home Equipment: Rolling Walker (2 wheels);Shower seat;Hand held Financial Trader (4 wheels) Additional Comments: DME obtained from previous admission, pt is not a reliable historian due to chronic memory deficits    Prior Function Prior Level of Function : Independent/Modified Independent;Driving;Needs assist             Mobility Comments: pt ambulates independently. Pt 's spouse arrives at the end of the PT eval and reports the pt has been taking outpatient PT for balance and management of drop foot       Extremity/Trunk  Assessment   Upper Extremity Assessment Upper Extremity Assessment: Overall WFL for tasks assessed    Lower Extremity Assessment Lower Extremity Assessment: LLE  deficits/detail LLE Deficits / Details: ankle DF grossly 4-/5, otherwise WFL    Cervical / Trunk Assessment Cervical / Trunk Assessment: Normal  Communication   Communication Communication: No apparent difficulties    Cognition Arousal: Alert Behavior During Therapy: WFL for tasks assessed/performed   PT - Cognitive impairments: History of cognitive impairments                       PT - Cognition Comments: pt with chronic memory deficits, unable to recall room number consistently during session, unable to utilize signs in hallway to find room after pt is cued on room number Following commands: Intact       Cueing Cueing Techniques: Verbal cues     General Comments General comments (skin integrity, edema, etc.): VSS on RA    Exercises     Assessment/Plan    PT Assessment Patient needs continued PT services  PT Problem List Decreased balance;Decreased activity tolerance;Decreased strength;Decreased mobility       PT Treatment Interventions DME instruction;Gait training;Stair training;Functional mobility training;Therapeutic activities;Therapeutic exercise;Balance training;Neuromuscular re-education;Patient/family education    PT Goals (Current goals can be found in the Care Plan section)  Acute Rehab PT Goals Patient Stated Goal: to return home, improve balance PT Goal Formulation: With patient/family Time For Goal Achievement: 02/19/24 Potential to Achieve Goals: Good Additional Goals Additional Goal #1: Pt will score >19/24 on the DGI to indicate a reduced risk for falls Additional Goal #2: Pt will score >45/56 on the BERG to indicate a reduced risk for falls    Frequency Min 1X/week     Co-evaluation               AM-PAC PT 6 Clicks Mobility  Outcome Measure Help needed turning from your back to your side while in a flat bed without using bedrails?: None Help needed moving from lying on your back to sitting on the side of a flat bed without  using bedrails?: None Help needed moving to and from a bed to a chair (including a wheelchair)?: A Little Help needed standing up from a chair using your arms (e.g., wheelchair or bedside chair)?: A Little Help needed to walk in hospital room?: A Little Help needed climbing 3-5 steps with a railing? : A Little 6 Click Score: 20    End of Session Equipment Utilized During Treatment: Gait belt Activity Tolerance: Patient tolerated treatment well Patient left: in chair;with call bell/phone within reach;with nursing/sitter in room;with family/visitor present Nurse Communication: Mobility status PT Visit Diagnosis: Other abnormalities of gait and mobility (R26.89);Other symptoms and signs involving the nervous system (R29.898)    Time: 8874-8854 PT Time Calculation (min) (ACUTE ONLY): 20 min   Charges:   PT Evaluation $PT Eval Low Complexity: 1 Low   PT General Charges $$ ACUTE PT VISIT: 1 Visit         Bernardino JINNY Ruth, PT, DPT Acute Rehabilitation Office [phone]   Bernardino JINNY Ruth 02/05/2024, 12:47 PM

## 2024-02-09 LAB — CULTURE, BLOOD (ROUTINE X 2)
Culture: NO GROWTH
Culture: NO GROWTH

## 2024-02-12 LAB — LAB REPORT - SCANNED: EGFR: 96

## 2024-02-19 NOTE — Progress Notes (Unsigned)
 Cardiology Clinic Note   Patient Name: James Ibarra Date of Encounter: 02/21/2024  Primary Care Provider:  Kip Righter, MD Primary Cardiologist:  Arun K Thukkani, MD  Patient Profile    James Ibarra 73 year old male presents to the clinic today for follow-up evaluation of his paroxysmal atrial fibrillation and hyperlipidemia.  Past Medical History    Past Medical History:  Diagnosis Date   Atrial fibrillation (HCC)    Cancer (HCC)    Skin Cancer - Scalp, Right neck, Left Hand   Dysrhythmia    A. Fib while hospitalized June 2024   Heart murmur    History of nonmelanoma skin cancer    Hypercholesteremia    Hypertension    Pneumonia 08/2022   Trigeminal neuralgia    Past Surgical History:  Procedure Laterality Date   ATRIAL FIBRILLATION ABLATION N/A 05/24/2023   Procedure: ATRIAL FIBRILLATION ABLATION;  Surgeon: Nancey Eulas BRAVO, MD;  Location: [facility];  Service: Cardiovascular;  Laterality: N/A;   CATARACT EXTRACTION W/ INTRAOCULAR LENS IMPLANT Bilateral    CLIPPING OF ATRIAL APPENDAGE Left 12/08/2022   Procedure: CLIPPING OF ATRIAL APPENDAGE (LAA) USING 50 MM ATRICLIP;  Surgeon: Maryjane Mt, MD;  Location: [facility] OR;  Service: Open Heart Surgery;  Laterality: Left;   INGUINAL HERNIA REPAIR  11/07/2011   Procedure: LAPAROSCOPIC INGUINAL HERNIA;  Surgeon: Camellia CHRISTELLA Blush, MD,FACS;  Location: [facility];  Service: General;  Laterality: Left;   MAZE N/A 12/08/2022   Procedure: MAZE;  Surgeon: Maryjane Mt, MD;  Location: [facility] OR;  Service: Open Heart Surgery;  Laterality: N/A;   MITRAL VALVE REPAIR N/A 12/08/2022   Procedure: MITRAL VALVE REPAIR (MVR) USING 34 MM SIMULUS SEMI- RIGID ANNULOPLASTY BAND;  Surgeon: Maryjane Mt, MD;  Location: [facility];  Service: Open Heart Surgery;  Laterality: N/A;   RETINAL DETACHMENT SURGERY  03/07/1995   RIGHT/LEFT HEART CATH AND CORONARY ANGIOGRAPHY N/A 10/12/2022   Procedure: RIGHT/LEFT HEART CATH AND CORONARY ANGIOGRAPHY;   Surgeon: Wendel Lurena POUR, MD;  Location: [facility];  Service: Cardiovascular;  Laterality: N/A;   SKIN CANCER EXCISION  2012, 1999   basal cell - neck and hand   TEE WITHOUT CARDIOVERSION N/A 10/16/2022   Procedure: TRANSESOPHAGEAL ECHOCARDIOGRAM;  Surgeon: Santo Stanly LABOR, MD;  Location: [facility];  Service: Cardiovascular;  Laterality: N/A;   TEE WITHOUT CARDIOVERSION N/A 12/08/2022   Procedure: TRANSESOPHAGEAL ECHOCARDIOGRAM;  Surgeon: Maryjane Mt, MD;  Location: [facility] OR;  Service: Open Heart Surgery;  Laterality: N/A;   TOTAL HIP ARTHROPLASTY Left 09/05/2023   Procedure: ARTHROPLASTY, HIP, TOTAL, ANTERIOR APPROACH;  Surgeon: Jerri Kay CHRISTELLA, MD;  Location: [facility] OR;  Service: Orthopedics;  Laterality: Left;    Allergies  Allergies[1]  History of Present Illness    [name] has a PMH of PAF not on anticoagulation, maze procedure 10/24, LAA clipping 10/24, A-fib ablation 3/25, essential hypertension, PFO closure 10/24, coronary artery calcifications, trigeminal neuralgia, and hyperlipidemia.  His PMH also includes mitral valve regurgitation.  Left total hip arthroplasty 09/05/2023.  He was seen and evaluated by Dr.Thukkani on 08/30/2023.  During that time it was noted that he had been admitted to the hospital earlier in the month due to worsening shortness of breath and was found to have developed a right sided pneumonia.  He was treated with antibiotics.  He developed new onset atrial fibrillation and required Cardizem  gtt.  Echocardiogram during admission showed severe mitral regurgitation due to posterior prolapse.  He developed alcohol withdrawal symptoms and was treated with phenobarbital .  He was discharged to SNF on Eliquis  and metoprolol .  During his follow-up 08/30/2023 he reported that he had quit using alcohol.  He was working on regaining his strength.  He was noticing palpitations with laying on his left side.  His symptoms had been present for around 2  months prior to being admitted to the hospital.  He noted that he was somewhat short of breath at times.  He denied PND, exertional angina, bleeding and bruising issues.  He underwent surgery with mitral valve repair, maze, atrial ligation 10/24.  His surgery went well.  He was seen by EP for recurrent atrial fibrillation and underwent pulmonary vein isolation procedure 3/25.  He presented to the hospital on 02/03/2024 and was discharged on 02/05/24.  He presented with acute encephalopathy.  He was complaining of altered mental status.  It was felt that his altered mental status could be attributed to viral infectious process.  He was noted to have low-grade temperature of 100.3.  He had multiple family members that had also contracted viral illness.  He had reassuring brain MRI which showed no acute CVA, EEG which showed no evidence of seizure, and no evidence of infection in UA or chest x-ray.  His mental status returned to baseline.  He was noted to have borderline low B12 and was started on p.o. supplementation.  He was noted to have bradycardia.  He was instructed to continue metoprolol  25 mg twice daily for 2 days and then resume normal dosing.  He presents to the clinic today for evaluation and states he is feeling more normal since leaving the hospital.  He presents with his family member who reported that the illness presented differently in other family members.  They noticed GI upset.  He was noted to have altered mental status and encephalopathy.  In the hospital his metoprolol  was decreased to 25 mg due to heart rate and decreased blood pressure.  In the clinic today his blood pressure is noted to be 116/66 and his pulse is 47.  I have asked him to increase his p.o. hydration, increase the calories in his diet, increase physical activity as tolerated, and proceed to physical therapy.  I will reduce his metoprolol  to 12-1/2 mg twice daily and plan follow-up in 2 to 3 months..  Today he denies  chest pain, shortness of breath, lower extremity edema, fatigue, palpitations, melena, hematuria, hemoptysis, diaphoresis, weakness, presyncope, syncope, orthopnea, and PND.     Home Medications    Prior to Admission medications  Medication Sig Start Date End Date Taking? Authorizing Provider  acetaminophen  (TYLENOL ) 325 MG tablet Take 2 tablets (650 mg total) by mouth every 6 (six) hours. Patient taking differently: Take 650 mg by mouth every 6 (six) hours as needed for mild pain (pain score 1-3) or moderate pain (pain score 4-6). 12/14/22   Roddenberry, Myron G, PA-C  amLODipine  (NORVASC ) 2.5 MG tablet Take 2.5 mg by mouth in the morning. Patient not taking: Reported on 02/05/2024 12/24/23   [provider]  ascorbic acid (VITAMIN C) 250 MG tablet Take 500 mg by mouth daily. Take two  tablets (500mg ) by mouth daily in the morning.    [provider]  aspirin  EC 81 MG tablet Take 1 tablet (81 mg total) by mouth daily. Swallow whole. 09/11/23   Odell Celinda Balo, MD  Cholecalciferol (VITAMIN D) 50 MCG (2000 UT) tablet Take 2,000 Units by mouth daily.  [provider]  cyanocobalamin  1000 MCG tablet Take 1 tablet (1,000 mcg total) by mouth daily. 02/06/24   Elgergawy, Dawood S, MD  fluorouracil (EFUDEX) 5 % cream Apply 1 Application topically 2 (two) times daily. 08/24/23   [provider]  folic acid  (FOLVITE ) 1 MG tablet Take 1 mg by mouth daily.    [provider]  gabapentin  (NEURONTIN ) 300 MG capsule TAKE 2 CAPSULES BY MOUTH 4 TIMES DAILY 02/05/24   Onita Duos, MD  hydrocortisone 2.5 % ointment Apply 1 Application topically 2 (two) times daily as needed (itching).    [provider]  Magnesium  400 MG TABS Take 400 mg by mouth every evening.    [provider]  melatonin 3 MG TABS tablet Take 3 mg by mouth at bedtime as needed (sleep).    [provider]  metoprolol  tartrate (LOPRESSOR ) 50 MG tablet Take 0.5 tablets (25  mg total) by mouth 2 (two) times daily. 02/07/24   Elgergawy, Brayton RAMAN, MD  Multiple Vitamins-Minerals (PRESERVISION AREDS 2) CAPS Take 1 capsule by mouth 2 (two) times daily.    [provider]  Polyethyl Glycol-Propyl Glycol (SYSTANE OP) Place 1 drop into both eyes daily as needed (dry eyes).    [provider]  rosuvastatin  (CRESTOR ) 20 MG tablet Take 1 tablet (20 mg total) by mouth daily. 08/30/23   Thukkani, Arun K, MD  sildenafil (REVATIO) 20 MG tablet Take 40-60 mg by mouth daily as needed (ED).    [provider]  telmisartan  (MICARDIS ) 40 MG tablet Take 1 tablet (40 mg total) by mouth daily. 08/30/23   Thukkani, Arun K, MD  thiamine  (VITAMIN B-1) 100 MG tablet Take 100 mg by mouth daily.    [provider]  traZODone  (DESYREL ) 100 MG tablet Take 100 mg by mouth at bedtime.    [provider]    Family History    Family History  Problem Relation Age of Onset   Heart disease Father    Heart disease Mother    He indicated that his mother is deceased. He indicated that his father is deceased.  Social History    Social History   Socioeconomic History   Marital status: Married    Spouse name: Not on file   Number of children: Not on file   Years of education: Not on file   Highest education level: Not on file  Occupational History   Not on file  Tobacco Use   Smoking status: Never   Smokeless tobacco: Never   Tobacco comments:    Never smoked 06/21/23  Vaping Use   Vaping status: Never Used  Substance and Sexual Activity   Alcohol use: Not Currently   Drug use: No   Sexual activity: Not on file  Other Topics Concern   Not on file  Social History Narrative   Not on file   Social Drivers of Health   Tobacco Use: Low Risk (02/21/2024)   Patient History    Smoking Tobacco Use: Never    Smokeless Tobacco Use: Never    Passive Exposure: Not on file  Financial Resource Strain: Not on file  Food Insecurity: No Food Insecurity  (02/04/2024)   Epic    Worried About Programme Researcher, Broadcasting/film/video in the Last Year: Never true    Ran Out of Food in the Last Year: Never true  Transportation Needs: No Transportation Needs (02/04/2024)   Epic    Lack of Transportation (Medical): No  Lack of Transportation (Non-Medical): No  Physical Activity: Not on file  Stress: Not on file  Social Connections: Unknown (02/04/2024)   Social Connection and Isolation Panel    Frequency of Communication with Friends and Family: Patient unable to answer    Frequency of Social Gatherings with Friends and Family: Once a week    Attends Religious Services: 1 to 4 times per year    Active Member of [employer] or Organizations: No    Attends Banker Meetings: Never    Marital Status: Married  Catering Manager Violence: Not At Risk (02/04/2024)   Epic    Fear of Current or Ex-Partner: No    Emotionally Abused: No    Physically Abused: No    Sexually Abused: No  Depression (PHQ2-9): Medium Risk (10/09/2023)   Depression (PHQ2-9)    PHQ-2 Score: 6  Alcohol Screen: Not on file  Housing: Low Risk (02/04/2024)   Epic    Unable to Pay for Housing in the Last Year: No    Number of Times Moved in the Last Year: 0    Homeless in the Last Year: No  Utilities: Not At Risk (02/04/2024)   Epic    Threatened with loss of utilities: No  Health Literacy: Not on file     Review of Systems    General:  No chills, fever, night sweats or weight changes.  Cardiovascular:  No chest pain, dyspnea on exertion, edema, orthopnea, palpitations, paroxysmal nocturnal dyspnea. Dermatological: No rash, lesions/masses Respiratory: No cough, dyspnea Urologic: No hematuria, dysuria Abdominal:   No nausea, vomiting, diarrhea, bright red blood per rectum, melena, or hematemesis Neurologic:  No visual changes, wkns, changes in mental status. All other systems reviewed and are otherwise negative except as noted above.  Physical Exam    VS:  BP 116/66   Pulse (!) 47    Ht 6' 1 (1.854 m)   Wt 157 lb (71.2 kg)   SpO2 99%   BMI 20.71 kg/m  , BMI Body mass index is 20.71 kg/m. GEN: Well nourished, well developed, in no acute distress. HEENT: normal. Neck: Supple, no JVD, carotid bruits, or masses. Cardiac: RRR, no murmurs, rubs, or gallops. No clubbing, cyanosis, edema.  Radials/DP/PT 2+ and equal bilaterally.  Respiratory:  Respirations regular and unlabored, clear to auscultation bilaterally. GI: Soft, nontender, nondistended, BS + x 4. MS: no deformity or atrophy. Skin: warm and dry, no rash. Neuro:  Strength and sensation are intact. Psych: Normal affect.  Accessory Clinical Findings    Recent Labs: 02/03/2024: TSH 0.624 02/04/2024: ALT 13 02/05/2024: BUN 7; Creatinine, Ser 0.56; Hemoglobin 14.2; Magnesium  2.0; Platelets 189; Potassium 3.3; Sodium 141   Recent Lipid Panel    Component Value Date/Time   CHOL 136 09/06/2023 0817   CHOL 156 10/04/2022 1042   TRIG 101 09/06/2023 0817   HDL 59 09/06/2023 0817   HDL 54 10/04/2022 1042   CHOLHDL 2.3 09/06/2023 0817   VLDL 20 09/06/2023 0817   LDLCALC 57 09/06/2023 0817   LDLCALC 83 10/04/2022 1042         ECG personally reviewed by me today-none today.     Echocardiogram 09/07/2023  IMPRESSIONS     1. Left ventricular ejection fraction, by estimation, is 70 to 75%. The  left ventricle has hyperdynamic function. The left ventricle has no  regional wall motion abnormalities. Left ventricular diastolic parameters  are consistent with Grade I diastolic  dysfunction (impaired relaxation).   2. Right ventricular systolic function  is normal. The right ventricular  size is normal. Tricuspid regurgitation signal is inadequate for assessing  PA pressure.   3. S/p mitral valve repair. No evidence of mitral valve regurgitation. No  evidence of mitral stenosis.   4. The aortic valve is tricuspid. Aortic valve regurgitation is not  visualized. No aortic stenosis is present. Aortic valve mean  gradient  measures 8.0 mmHg. Aortic valve Vmax measures 1.93 m/s.   5. Aortic dilatation noted. There is mild dilatation of the ascending  aorta, measuring 41 mm.   6. The inferior vena cava is normal in size with greater than 50%  respiratory variability, suggesting right atrial pressure of 3 mmHg.   7. Suboptimal images for bubble study, inconclusive. Patient is s/p PFO  closure in 12/2022.   8. Increased flow velocities may be secondary to anemia, thyrotoxicosis,  hyperdynamic or high flow state.   FINDINGS   Left Ventricle: Left ventricular ejection fraction, by estimation, is 70  to 75%. The left ventricle has hyperdynamic function. The left ventricle  has no regional wall motion abnormalities. Strain was performed and the  global longitudinal strain is  indeterminate. The left ventricular internal cavity size was normal in  size. There is no left ventricular hypertrophy. Left ventricular diastolic  parameters are consistent with Grade I diastolic dysfunction (impaired  relaxation).   Right Ventricle: The right ventricular size is normal. No increase in  right ventricular wall thickness. Right ventricular systolic function is  normal. Tricuspid regurgitation signal is inadequate for assessing PA  pressure.   Left Atrium: Left atrial size was normal in size.   Right Atrium: Right atrial size was normal in size.   Pericardium: There is no evidence of pericardial effusion.   Mitral Valve: The mitral valve is grossly normal. No evidence of mitral  valve regurgitation. No evidence of mitral valve stenosis.   Tricuspid Valve: The tricuspid valve is normal in structure. Tricuspid  valve regurgitation is mild . No evidence of tricuspid stenosis.   Aortic Valve: The aortic valve is tricuspid. Aortic valve regurgitation is  not visualized. No aortic stenosis is present. Aortic valve mean gradient  measures 8.0 mmHg. Aortic valve peak gradient measures 14.9 mmHg. Aortic  valve  area, by VTI measures 3.41   cm.   Pulmonic Valve: The pulmonic valve was grossly normal. Pulmonic valve  regurgitation is trivial. No evidence of pulmonic stenosis.   Aorta: The aortic root is normal in size and structure and aortic  dilatation noted. There is mild dilatation of the ascending aorta,  measuring 41 mm.   Venous: The inferior vena cava is normal in size with greater than 50%  respiratory variability, suggesting right atrial pressure of 3 mmHg.   IAS/Shunts: No atrial level shunt detected by color flow Doppler. Agitated  saline contrast was given intravenously to evaluate for intracardiac  shunting. Suboptimal images for bubble study, inconclusive.      Assessment & Plan   1.  Coronary artery disease-no chest pain today.  Noted to have minimal plaque on coronary angiography 2024. Heart healthy low-sodium diet Continue aspirin , amlodipine , metoprolol , rosuvastatin , telmisartan   Hyperlipidemia-LDL 57 on 09/06/23. High-fiber diet Continue aspirin , rosuvastatin   Status post mitral valve repair-no increased DOE or activity intolerance.  Echocardiogram 09/07/2023 showed an LVEF of 70-75%, G1 DD, and no evidence of mitral stenosis or mitral regurgitation. Plan for repeat echocardiogram 7/26  Paroxysmal atrial fibrillation-heart rate today 47 bpm.  Denies recent irregular or accelerated heartbeats.  He is status post maze  10/24, atrial fibrillation ablation 3/25, and LAA clipping 10/24. Continue metoprolol -reduce to 12.5 mg twice daily  Essential hypertension-BP today 116/66. Heart healthy low-sodium diet Maintain physical activity Continue amlodipine , telmisartan  Reduce metoprolol  to 12.5 mg twice daily Maintain p.o. hydration Increase calories in diet  Disposition: Follow-up with Dr.Thukkani or me in 6 months.   Josefa HERO. Zonnique Norkus NP-C     02/21/2024, 11:42 AM [facility] [address] [place], [place] [address] Office  [phone]    Notice: This dictation was prepared with Dragon dictation along with smaller phrase technology. Any transcriptional errors that result from this process are unintentional and may not be corrected upon review.   I spent 14 minutes examining this patient, reviewing medications, and using patient centered shared decision making involving their cardiac care.   I spent  20 minutes reviewing past medical history,  medications, and prior cardiac tests.     [1] No Known Allergies

## 2024-02-21 ENCOUNTER — Ambulatory Visit: Admitting: General Practice

## 2024-02-21 ENCOUNTER — Ambulatory Visit: Admitting: Physical Therapy

## 2024-02-21 ENCOUNTER — Encounter: Payer: Self-pay | Admitting: General Practice

## 2024-02-21 VITALS — BP 116/66 | HR 47 | Ht 73.0 in | Wt 157.0 lb

## 2024-02-21 DIAGNOSIS — E785 Hyperlipidemia, unspecified: Secondary | ICD-10-CM

## 2024-02-21 DIAGNOSIS — I10 Essential (primary) hypertension: Secondary | ICD-10-CM

## 2024-02-21 DIAGNOSIS — Z9889 Other specified postprocedural states: Secondary | ICD-10-CM | POA: Diagnosis not present

## 2024-02-21 DIAGNOSIS — I480 Paroxysmal atrial fibrillation: Secondary | ICD-10-CM

## 2024-02-21 DIAGNOSIS — I2510 Atherosclerotic heart disease of native coronary artery without angina pectoris: Secondary | ICD-10-CM | POA: Diagnosis not present

## 2024-02-21 MED ORDER — METOPROLOL TARTRATE 25 MG PO TABS: 12.5000 mg | ORAL_TABLET | Freq: Two times a day (BID) | ORAL | 1 refills | Status: AC

## 2024-02-21 NOTE — Therapy (Signed)
 OUTPATIENT PHYSICAL THERAPY NEURO EVALUATION   Patient Name: James Ibarra MRN: [id number] DOB:08/14/1950, 73 y.o., male Today's Date: 02/21/2024   PCP:  Kip Righter, MD     REFERRING PROVIDER: Onita Duos, MD  END OF SESSION:   Past Medical History:  Diagnosis Date   Atrial fibrillation (HCC)    Cancer [facility])    Skin Cancer - Scalp, Right neck, Left Hand   Dysrhythmia    A. Fib while hospitalized June 2024   Heart murmur    History of nonmelanoma skin cancer    Hypercholesteremia    Hypertension    Pneumonia 08/2022   Trigeminal neuralgia    Past Surgical History:  Procedure Laterality Date   ATRIAL FIBRILLATION ABLATION N/A 05/24/2023   Procedure: ATRIAL FIBRILLATION ABLATION;  Surgeon: Nancey Eulas BRAVO, MD;  Location: [facility];  Service: Cardiovascular;  Laterality: N/A;   CATARACT EXTRACTION W/ INTRAOCULAR LENS IMPLANT Bilateral    CLIPPING OF ATRIAL APPENDAGE Left 12/08/2022   Procedure: CLIPPING OF ATRIAL APPENDAGE (LAA) USING 50 MM ATRICLIP;  Surgeon: Maryjane Mt, MD;  Location: [facility] OR;  Service: Open Heart Surgery;  Laterality: Left;   INGUINAL HERNIA REPAIR  11/07/2011   Procedure: LAPAROSCOPIC INGUINAL HERNIA;  Surgeon: Camellia CHRISTELLA Blush, MD,FACS;  Location: [facility] ORS;  Service: General;  Laterality: Left;   MAZE N/A 12/08/2022   Procedure: MAZE;  Surgeon: Maryjane Mt, MD;  Location: [facility] OR;  Service: Open Heart Surgery;  Laterality: N/A;   MITRAL VALVE REPAIR N/A 12/08/2022   Procedure: MITRAL VALVE REPAIR (MVR) USING 34 MM SIMULUS SEMI- RIGID ANNULOPLASTY BAND;  Surgeon: Maryjane Mt, MD;  Location: [facility] OR;  Service: Open Heart Surgery;  Laterality: N/A;   RETINAL DETACHMENT SURGERY  03/07/1995   RIGHT/LEFT HEART CATH AND CORONARY ANGIOGRAPHY N/A 10/12/2022   Procedure: RIGHT/LEFT HEART CATH AND CORONARY ANGIOGRAPHY;  Surgeon: Wendel Lurena POUR, MD;  Location: [facility];  Service: Cardiovascular;  Laterality: N/A;   SKIN CANCER EXCISION  2012,  1999   basal cell - neck and hand   TEE WITHOUT CARDIOVERSION N/A 10/16/2022   Procedure: TRANSESOPHAGEAL ECHOCARDIOGRAM;  Surgeon: Santo Stanly LABOR, MD;  Location: [facility];  Service: Cardiovascular;  Laterality: N/A;   TEE WITHOUT CARDIOVERSION N/A 12/08/2022   Procedure: TRANSESOPHAGEAL ECHOCARDIOGRAM;  Surgeon: Maryjane Mt, MD;  Location: [facility] OR;  Service: Open Heart Surgery;  Laterality: N/A;   TOTAL HIP ARTHROPLASTY Left 09/05/2023   Procedure: ARTHROPLASTY, HIP, TOTAL, ANTERIOR APPROACH;  Surgeon: Jerri Kay CHRISTELLA, MD;  Location: [facility] OR;  Service: Orthopedics;  Laterality: Left;   Patient Active Problem List   Diagnosis Date Noted   Hypokalemia 02/04/2024   Acute encephalopathy 02/03/2024   Gait abnormality 01/14/2024   Chronic low back pain without sciatica 01/14/2024   Malnutrition of moderate degree 09/05/2023   Subcapital fracture of neck of left femur, closed, initial encounter (HCC) 09/04/2023   Fall at home, initial encounter 09/04/2023   Left ankle injury, initial encounter 07/07/2023   Acute left ankle pain 07/07/2023   History of essential hypertension 07/07/2023   Hypercoagulable state due to paroxysmal atrial fibrillation (HCC) 06/21/2023   S/P MVR (mitral valve repair) 12/08/2022   Protein-calorie malnutrition, severe 08/19/2022   Alcohol withdrawal (HCC) 08/14/2022   Acute metabolic encephalopathy 08/14/2022   Paroxysmal atrial fibrillation (HCC) 08/14/2022   Nonrheumatic mitral valve regurgitation 08/14/2022   Mitral valve prolapse 08/14/2022   PNA (pneumonia) 08/12/2022   Severe sepsis (HCC) 08/12/2022   HLD (  hyperlipidemia) 08/12/2022   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   Trigeminal neuralgia of left side of face 07/15/2019   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  Rationale for Evaluation and Treatment: Rehabilitation  SUBJECTIVE:                                                                                                                                                                                             SUBJECTIVE STATEMENT: *** Pt accompanied by: {accompnied:27141}  PERTINENT HISTORY: A-fib, HLD, HTN, trigeminal neuralgia, mitral valve repair 2024, L THA Hospitalized 02/03/2024-02/05/24 with AMS, found to have acute encephalopathy.   PAIN:  Are you having pain? {OPRCPAIN:27236}  PRECAUTIONS: {Therapy precautions:24002}  RED FLAGS: {PT Red Flags:29287}   WEIGHT BEARING RESTRICTIONS: {Yes ***/No:24003}  FALLS: Has patient fallen in last 6 months? {fallsyesno:27318}  LIVING ENVIRONMENT: Lives with: {OPRC lives with:25569::lives with their family} Lives in: {Lives in:25570} Stairs: {opstairs:27293} Has following equipment at home: {Assistive devices:23999}  PLOF: {PLOF:24004}  PATIENT GOALS: ***  OBJECTIVE:  Note: Objective measures were completed at Evaluation unless otherwise noted.  DIAGNOSTIC FINDINGS: 01/29/24  MRI scan of the lumbar spine showing mild disc and prominent facet degenerative changes most prominent at L4-5 where there is mild right-sided foraminal narrowing.  02/04/24 brain MRI: Interval resolution of restricted diffusion within the left hippocampus. 2. Moderate diffuse cerebral volume loss. 3. 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  COGNITION: Overall cognitive status: {cognition:24006}   SENSATION: {sensation:27233}  COORDINATION: Alternating pronation/supination: *** Alternating toe tap: *** Finger to nose: *** Heel to shin: ***    MUSCLE TONE: {LE tone:25568}  POSTURE: {posture:25561}  LOWER  EXTREMITY ROM:     Active  Right Eval Left Eval  Hip flexion    Hip extension    Hip abduction    Hip adduction    Hip internal rotation    Hip external rotation    Knee flexion    Knee extension    Ankle dorsiflexion    Ankle plantarflexion    Ankle inversion    Ankle eversion     (Blank rows = not tested)  LOWER EXTREMITY MMT:    MMT Right Eval Left Eval  Hip flexion    Hip extension    Hip abduction    Hip adduction  Hip internal rotation    Hip external rotation    Knee flexion    Knee extension    Ankle dorsiflexion    Ankle plantarflexion    Ankle inversion    Ankle eversion    (Blank rows = not tested)  TRANSFERS: {transfers eval:32620}  GAIT: Findings: Assistive device utilized:{Assistive devices:23999}, Level of assistance: {Levels of assistance:24026}, and Comments: ***  FUNCTIONAL TESTS:  {Functional tests:24029}  PATIENT SURVEYS:  {rehab surveys:24030}                                                                                                                              TREATMENT DATE: ***    PATIENT EDUCATION: Education details: *** Person educated: {Person educated:25204} Education method: {Education Method:25205} Education comprehension: {Education Comprehension:25206}  HOME EXERCISE PROGRAM: ***   GOALS: Goals reviewed with patient? Yes  SHORT TERM GOALS: Target date: {follow up:25551}  Patient to be independent with initial HEP. Baseline: HEP initiated Goal status: {GOALSTATUS:25110}    LONG TERM GOALS: Target date: {follow up:25551}  Patient to be independent with advanced HEP. Baseline: Not yet initiated  Goal status: {GOALSTATUS:25110}  Patient to demonstrate B LE strength >/=4+/5.  Baseline: See above Goal status: {GOALSTATUS:25110}  Patient to demonstrate *** ROM WFL and without pain limiting.  Baseline: *** Goal status: {GOALSTATUS:25110}  Patient to report and demonstrate improved head, neck, and  shoulder posture at rest and with activity.  Baseline: *** Goal status: {GOALSTATUS:25110}  Patient to demonstrate alternating reciprocal pattern when ascending and descending stairs with good stability and 1 handrail as needed.   Baseline: Unable Goal status: {GOALSTATUS:25110}  Patient to score at least 20/24 on DGI in order to decrease risk of falls.  Baseline: *** Goal status: {GOALSTATUS:25110}  Patient to complete TUG in <14 sec with LRAD in order to decrease risk of falls.   Baseline: *** Goal status: {GOALSTATUS:25110}  Patient to demonstrate 5xSTS test in <15 sec in order to decrease risk of falls.  Baseline: *** Goal status: {GOALSTATUS:25110}  Patient to score at least ***/56 on Berg in order to decrease risk of falls.  Baseline: *** Goal status: {GOALSTATUS:25110}  Patient to score at least *** on FOTO in order to indicate improved functional outcomes.  Baseline: *** Goal status: {GOALSTATUS:25110}  ASSESSMENT:  CLINICAL IMPRESSION:  Patient is a 72 y/o F presenting to [facility] with c/o *** for the past ***. Of note, patient was recently hospitalized 02/03/2024-02/05/24 with AMS, found to have acute encephalopathy. This occurred since time of referral.   Patient today presenting with ***.    Patient was educated on gentle *** HEP and reported understanding. Prior to current episode, patient was independent. Would benefit from skilled PT services *** x/week for *** weeks to address aforementioned impairments in order to optimize level of function.    OBJECTIVE IMPAIRMENTS: {opptimpairments:25111}.   ACTIVITY LIMITATIONS: {activitylimitations:27494}  PARTICIPATION LIMITATIONS: {participationrestrictions:25113}  PERSONAL FACTORS: {Personal factors:25162} are also affecting patient's functional outcome.  REHAB POTENTIAL: {rehabpotential:25112}  CLINICAL DECISION MAKING: {clinical decision making:25114}  EVALUATION COMPLEXITY: {Evaluation  complexity:25115}  PLAN:  PT FREQUENCY: {rehab frequency:25116}  PT DURATION: {rehab duration:25117}  PLANNED INTERVENTIONS: {rehab planned interventions:25118::97110-Therapeutic exercises,97530- Therapeutic [phone]- Neuromuscular re-education,97535- Self Rjmz,02859- Manual therapy,Patient/Family education}  PLAN FOR NEXT SESSION: ***   Slater MARLA Christians, PT 02/21/2024, 1:13 PM

## 2024-02-21 NOTE — Patient Instructions (Signed)
 Medication Instructions:  STOP Toprol  XL  START Metoprolol  Tartrate 12.5mg ; the tablet will come in 25mg  break tablet in half and take half tablet twice a day  *If you need a refill on your cardiac medications before your next appointment, please call your pharmacy*  Lab Work: We will request labs from your pcp If you have labs (blood work) drawn today and your tests are completely normal, you will receive your results only by: MyChart Message (if you have MyChart) OR A paper copy in the mail If you have any lab test that is abnormal or we need to change your treatment, we will call you to review the results.  Testing/Procedures: None ordered  Follow-Up: At [facility], you and your health needs are our priority.  As part of our continuing mission to provide you with exceptional heart care, our providers are all part of one team.  This team includes your primary Cardiologist (physician) and Advanced Practice Providers or APPs (Physician Assistants and Nurse Practitioners) who all work together to provide you with the care you need, when you need it.  Your next appointment:   2-3 month(s)  Provider:   Arun K Thukkani, MD  or Josefa Beauvais, NP  We recommend signing up for the patient portal called MyChart.  Sign up information is provided on this After Visit Summary.  MyChart is used to connect with patients for Virtual Visits (Telemedicine).  Patients are able to view lab/test results, encounter notes, upcoming appointments, etc.  Non-urgent messages can be sent to your provider as well.   To learn more about what you can do with MyChart, go to [email or web address].   Other Instructions  INCREASE YOUR CALORIE INTAKE.  How to stay hydrated  Drink water  regularly: Sip water  steadily throughout the day, even before you feel thirsty. A general goal is about 6-7 cups (50 ounces) per day, but individual needs vary based on age, activity level, and health. Drink with meals:  Make it a habit to drink water  with every meal. Keep water  handy: Carry a reusable water  bottle with you to encourage consistent sipping. Hydrate around exercise: Drink water  before, during, and after physical activity. Eat hydrating foods: Fruits, vegetables, and soups with broth also contribute to your fluid intake. Monitor your urine: Aim for a light yellow color, like lemonade.   Why hydration is important  Regulates body temperature: Water  helps regulate your temperature through sweat production. Lubricates joints: It helps form the synovial fluid that lubricates your joints. Aids digestion: Water  is crucial for moistening food and helping digestive enzymes work. Transports nutrients: Blood, which is largely water , transports oxygen and nutrients throughout the body. Boosts physical performance: Adequate hydration can help muscles work efficiently and prevent cramps, dizziness, and fatigue.   When to drink more fluids  During hot weather: You need to drink more when it's hot. During exercise: You lose fluids through sweat when you exercise. When you are sick: You may need extra fluids if you are ill. Consult a doctor: If you have a medical condition or take certain medications, your doctor can give you personalized advice on hydration.

## 2024-02-22 ENCOUNTER — Encounter: Payer: Self-pay | Admitting: Physical Therapy

## 2024-02-22 ENCOUNTER — Other Ambulatory Visit: Payer: Self-pay

## 2024-02-22 ENCOUNTER — Ambulatory Visit: Admitting: Physical Therapy

## 2024-02-22 DIAGNOSIS — R2689 Other abnormalities of gait and mobility: Secondary | ICD-10-CM | POA: Insufficient documentation

## 2024-02-22 DIAGNOSIS — G8929 Other chronic pain: Secondary | ICD-10-CM | POA: Insufficient documentation

## 2024-02-22 DIAGNOSIS — M545 Low back pain, unspecified: Secondary | ICD-10-CM | POA: Insufficient documentation

## 2024-02-22 DIAGNOSIS — M6281 Muscle weakness (generalized): Secondary | ICD-10-CM | POA: Diagnosis present

## 2024-02-22 DIAGNOSIS — R269 Unspecified abnormalities of gait and mobility: Secondary | ICD-10-CM | POA: Insufficient documentation

## 2024-02-22 DIAGNOSIS — R2681 Unsteadiness on feet: Secondary | ICD-10-CM | POA: Diagnosis present

## 2024-02-25 NOTE — Therapy (Incomplete)
 " OUTPATIENT PHYSICAL THERAPY NEURO TREATMENT   Patient Name: James Ibarra MRN: [id number] DOB:24-Jul-1950, 73 y.o., male Today's Date: 02/25/2024   PCP:  Kip Righter, MD     REFERRING PROVIDER: Onita Duos, MD  END OF SESSION:    Past Medical History:  Diagnosis Date   Atrial fibrillation (HCC)    Cancer [facility])    Skin Cancer - Scalp, Right neck, Left Hand   Dysrhythmia    A. Fib while hospitalized June 2024   Heart murmur    History of nonmelanoma skin cancer    Hypercholesteremia    Hypertension    Pneumonia 08/2022   Trigeminal neuralgia    Past Surgical History:  Procedure Laterality Date   ATRIAL FIBRILLATION ABLATION N/A 05/24/2023   Procedure: ATRIAL FIBRILLATION ABLATION;  Surgeon: Nancey Eulas BRAVO, MD;  Location: [facility];  Service: Cardiovascular;  Laterality: N/A;   CATARACT EXTRACTION W/ INTRAOCULAR LENS IMPLANT Bilateral    CLIPPING OF ATRIAL APPENDAGE Left 12/08/2022   Procedure: CLIPPING OF ATRIAL APPENDAGE (LAA) USING 50 MM ATRICLIP;  Surgeon: Maryjane Mt, MD;  Location: [facility] OR;  Service: Open Heart Surgery;  Laterality: Left;   INGUINAL HERNIA REPAIR  11/07/2011   Procedure: LAPAROSCOPIC INGUINAL HERNIA;  Surgeon: Camellia CHRISTELLA Blush, MD,FACS;  Location: [facility];  Service: General;  Laterality: Left;   MAZE N/A 12/08/2022   Procedure: MAZE;  Surgeon: Maryjane Mt, MD;  Location: [facility] OR;  Service: Open Heart Surgery;  Laterality: N/A;   MITRAL VALVE REPAIR N/A 12/08/2022   Procedure: MITRAL VALVE REPAIR (MVR) USING 34 MM SIMULUS SEMI- RIGID ANNULOPLASTY BAND;  Surgeon: Maryjane Mt, MD;  Location: [facility] OR;  Service: Open Heart Surgery;  Laterality: N/A;   RETINAL DETACHMENT SURGERY  03/07/1995   RIGHT/LEFT HEART CATH AND CORONARY ANGIOGRAPHY N/A 10/12/2022   Procedure: RIGHT/LEFT HEART CATH AND CORONARY ANGIOGRAPHY;  Surgeon: Wendel Lurena POUR, MD;  Location: [facility];  Service: Cardiovascular;  Laterality: N/A;   SKIN CANCER EXCISION  2012,  1999   basal cell - neck and hand   TEE WITHOUT CARDIOVERSION N/A 10/16/2022   Procedure: TRANSESOPHAGEAL ECHOCARDIOGRAM;  Surgeon: Santo Stanly LABOR, MD;  Location: [facility];  Service: Cardiovascular;  Laterality: N/A;   TEE WITHOUT CARDIOVERSION N/A 12/08/2022   Procedure: TRANSESOPHAGEAL ECHOCARDIOGRAM;  Surgeon: Maryjane Mt, MD;  Location: [facility] OR;  Service: Open Heart Surgery;  Laterality: N/A;   TOTAL HIP ARTHROPLASTY Left 09/05/2023   Procedure: ARTHROPLASTY, HIP, TOTAL, ANTERIOR APPROACH;  Surgeon: Jerri Kay CHRISTELLA, MD;  Location: [facility] OR;  Service: Orthopedics;  Laterality: Left;   Patient Active Problem List   Diagnosis Date Noted   Hypokalemia 02/04/2024   Acute encephalopathy 02/03/2024   Gait abnormality 01/14/2024   Chronic low back pain without sciatica 01/14/2024   Malnutrition of moderate degree 09/05/2023   Subcapital fracture of neck of left femur, closed, initial encounter (HCC) 09/04/2023   Fall at home, initial encounter 09/04/2023   Left ankle injury, initial encounter 07/07/2023   Acute left ankle pain 07/07/2023   History of essential hypertension 07/07/2023   Hypercoagulable state due to paroxysmal atrial fibrillation (HCC) 06/21/2023   S/P MVR (mitral valve repair) 12/08/2022   Protein-calorie malnutrition, severe 08/19/2022   Alcohol withdrawal (HCC) 08/14/2022   Acute metabolic encephalopathy 08/14/2022   Paroxysmal atrial fibrillation (HCC) 08/14/2022   Nonrheumatic mitral valve regurgitation 08/14/2022   Mitral valve prolapse 08/14/2022   PNA (pneumonia) 08/12/2022   Severe sepsis (HCC) 08/12/2022  HLD (hyperlipidemia) 08/12/2022   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   Trigeminal neuralgia of left side of face 07/15/2019   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  Rationale for Evaluation and Treatment: Rehabilitation  SUBJECTIVE:                                                                                                                                                                                             SUBJECTIVE STATEMENT: Patient reports that since his hospitalization he feels unsteady in his lower legs and ankles. Reports that he was diagnosed with foot drop. Confirms that he had some gait difficulty prior to hospitalization but it is worse now. Reports reduced activity levels since his dog died as he is no longer taking walks. Reports N/T in the feet and ankles. Reports one fall in the past but he was drinking at the time and has stopped drinking since. Uses [employer] occasionally   Pt accompanied by: self  PERTINENT HISTORY: A-fib, HLD, HTN, trigeminal neuralgia, mitral valve repair 2024, L THA Hospitalized 02/03/2024-02/05/24 with AMS, found to have acute encephalopathy.   PAIN:  Are you having pain? No  PRECAUTIONS: Fall  RED FLAGS: None   WEIGHT BEARING RESTRICTIONS: No  FALLS: Has patient fallen in last 6 months? No  LIVING ENVIRONMENT: Lives with: lives with their spouse Lives in: House/apartment Stairs: 7-8 steps to enter with rail; bedroom on 1st floor Has following equipment at home: Single point cane, Environmental Consultant - 4 wheeled, Tour manager, and Grab bars  PLOF: Independent, Vocation/Vocational requirements: retired, and Leisure: guitar and walking but has stopped these  PATIENT GOALS: improve balance  OBJECTIVE:      TODAY'S TREATMENT: 02/26/24 Activity Comments                           Note: Objective measures were completed at Evaluation unless otherwise noted.  DIAGNOSTIC FINDINGS: 01/29/24  MRI scan of the lumbar spine showing  mild disc and prominent facet degenerative changes most prominent at L4-5 where there is mild right-sided foraminal narrowing.  02/04/24 brain MRI: Interval resolution of restricted diffusion within the left hippocampus. 2. Moderate diffuse cerebral volume loss. 3. 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  COGNITION: Overall cognitive status: Within functional  limits for tasks assessed   SENSATION: Intact in B LEs to light touch/pressure  COORDINATION: Alternating pronation/supination: WNL B Alternating toe tap: WNL B Finger to nose: slight dysmetria and intention tremor B   MUSCLE TONE: WNL B LEs  POSTURE: rounded shoulders and forward head  LOWER EXTREMITY ROM:     Active  Right Eval Left Eval  Hip flexion    Hip extension    Hip abduction    Hip adduction    Hip internal rotation    Hip external rotation    Knee flexion    Knee extension    Ankle dorsiflexion 6 4  Ankle plantarflexion    Ankle inversion    Ankle eversion     (Blank rows = not tested)  LOWER EXTREMITY MMT:    MMT (in sitting) Right Eval Left Eval  Hip flexion 3+ 4  Hip extension    Hip abduction 4+ 4  Hip adduction 4 4  Hip internal rotation    Hip external rotation    Knee flexion 4+ 4  Knee extension 4+ 4-  Ankle dorsiflexion 3+ 3+  Ankle plantarflexion 2+ Unable to complete 1 full rep in SLS 2+ Unable to complete 1 full rep in SLS  Ankle inversion    Ankle eversion    (Blank rows = not tested)   GAIT: Findings: Assistive device utilized:None, Level of assistance: Modified independence and SBA, and Comments: B reduced foot clearance and feet scuffing, slight imbalance, and slight posterior trunk lean  FUNCTIONAL TESTS:  5 times sit to stand: 13.37 sec with B UEs  10 meter walk test: 11.87 sec (2.76 ft/sec) Patient unable to stand without UE support                                                                                                                                TREATMENT DATE: 02/22/24    PATIENT EDUCATION: Education details: prognosis, POC, edu on exam findings and relevance to pt's functional impairments, HEP Person educated: Patient Education method: Explanation, Demonstration, Tactile cues, Verbal cues, and Handouts Education comprehension: verbalized understanding and returned demonstration  HOME EXERCISE PROGRAM: Access Code: [email or web address] URL: [email or web address] Date: 02/22/2024 Prepared by: [facility] - Outpatient  Rehab - [facility]  Exercises - Standing Gastroc Stretch at Counter  - 1 x daily - 5 x weekly - 2 sets - 30 sec hold - Heel Toe Raises with Counter Support  - 1 x daily - 5 x weekly - 2 sets - 10 reps - Mini Squat with Counter Support  - 1 x daily - 5 x weekly - 2 sets - 10 reps   GOALS: Goals reviewed with patient? Yes  SHORT TERM GOALS: Target date: 03/14/2024  Patient to be independent with initial HEP. Baseline: HEP initiated Goal status: IN PROGRESS    LONG TERM GOALS: Target date: 04/04/2024  Patient to be independent with advanced HEP. Baseline: Not yet initiated  Goal status: IN PROGRESS  Patient to score at least 20/24 on DGI in order to decrease risk of falls.  Baseline: NT Goal status: IN PROGRESS  Patient to demonstrate gait speed of at least 3.0 ft/sec in order to improve access to community.  Baseline: 2.76 ft/sec Goal status: IN PROGRESS  Patient to demonstrate STS without UE support. Baseline: unable Goal status: IN PROGRESS  Patient to report plans to participate in home or community exercise regimen to maintain fitness.  Baseline: not performing Goal status: IN PROGRESS  ASSESSMENT:  CLINICAL IMPRESSION:  Patient is a 73 y/o M presenting to OPPT with c/o worsened imbalance and LE weakness since recent hospitalization 02/03/2024-02/05/24 for AMS, found to have acute encephalopathy. This occurred since time of referral. Patient today  presenting with UE dysmetria and intention tremor, rounded posture, limited B ankle dorsiflexion AROM, LE weakness, decreased gait speed, and difficulty with transfers. Patient was educated on gentle strengthening HEP and reported understanding.  Would benefit from skilled PT services 2 x/week for 6 weeks to address aforementioned impairments in order to optimize level of function.    OBJECTIVE IMPAIRMENTS: Abnormal gait, decreased activity tolerance, decreased balance, decreased ROM, decreased strength, and postural dysfunction.   ACTIVITY LIMITATIONS: carrying, lifting, bending, standing, squatting, stairs, transfers, bathing, dressing, and locomotion level  PARTICIPATION LIMITATIONS: meal prep, cleaning, laundry, shopping, community activity, and church  PERSONAL FACTORS: Age, Fitness, Past/current experiences, Time since onset of injury/illness/exacerbation, and 3+ comorbidities: A-fib, HLD, HTN, trigeminal neuralgia, mitral valve repair 2024, L THA are also affecting patient's functional outcome.   REHAB POTENTIAL: Good  CLINICAL DECISION MAKING: Evolving/moderate complexity  EVALUATION COMPLEXITY: Moderate  PLAN:  PT FREQUENCY: 2x/week  PT DURATION: 6 weeks  PLANNED INTERVENTIONS: [id number]- PT Re-evaluation, 97110-Therapeutic exercises, 97530- Therapeutic activity, 97112- Neuromuscular re-education, 97535- Self Care, [id number]- Manual therapy, [phone]- Gait training, [phone]- Canalith repositioning, [id number]- Electrical stimulation (manual), [phone] (1-2 muscles), [id number] (3+ muscles)- Dry Needling, Patient/Family education, Balance training, Stair training, Taping, Joint mobilization, Spinal mobilization, Vestibular training, Cryotherapy, and Moist heat  PLAN FOR NEXT SESSION: DGI, review HEP and progress for LE strength, ankle control and strategy, STS transfers    Louana Terrilyn Christians, Love, DPT 02/25/2024 9:56 AM  [facility] at [facility] [address],  [address] [place], [place] [address] Phone # [phone] Fax # [phone]        "

## 2024-02-26 ENCOUNTER — Ambulatory Visit: Admitting: Physical Therapy

## 2024-02-26 NOTE — Therapy (Signed)
 " OUTPATIENT PHYSICAL THERAPY NEURO TREATMENT   Patient Name: James Ibarra MRN: [id number] DOB:15-May-1950, 73 y.o., male Today's Date: 02/27/2024   PCP:  Kip Righter, MD     REFERRING PROVIDER: Onita Duos, MD  END OF SESSION:  PT End of Session - 02/27/24 1057     Visit Number 2    Number of Visits 13    Date for Recertification  04/04/24    Authorization Type [employer] [facility]    Authorization Time Period approved 13 PT visits from 02/22/24-04/11/24    Authorization - Visit Number 2    Authorization - Number of Visits 13    PT Start Time 1020    PT Stop Time 1058    PT Time Calculation (min) 38 min    Equipment Utilized During Treatment Gait belt    Activity Tolerance Patient tolerated treatment well    Behavior During Therapy WFL for tasks assessed/performed           Past Medical History:  Diagnosis Date   Atrial fibrillation (HCC)    Cancer (HCC)    Skin Cancer - Scalp, Right neck, Left Hand   Dysrhythmia    A. Fib while hospitalized June 2024   Heart murmur    History of nonmelanoma skin cancer    Hypercholesteremia    Hypertension    Pneumonia 08/2022   Trigeminal neuralgia    Past Surgical History:  Procedure Laterality Date   ATRIAL FIBRILLATION ABLATION N/A 05/24/2023   Procedure: ATRIAL FIBRILLATION ABLATION;  Surgeon: Nancey Eulas BRAVO, MD;  Location: [facility];  Service: Cardiovascular;  Laterality: N/A;   CATARACT EXTRACTION W/ INTRAOCULAR LENS IMPLANT Bilateral    CLIPPING OF ATRIAL APPENDAGE Left 12/08/2022   Procedure: CLIPPING OF ATRIAL APPENDAGE (LAA) USING 50 MM ATRICLIP;  Surgeon: Maryjane Mt, MD;  Location: [facility] OR;  Service: Open Heart Surgery;  Laterality: Left;   INGUINAL HERNIA REPAIR  11/07/2011   Procedure: LAPAROSCOPIC INGUINAL HERNIA;  Surgeon: Camellia CHRISTELLA Blush, MD,FACS;  Location: [facility];  Service: General;  Laterality: Left;   MAZE N/A 12/08/2022   Procedure: MAZE;  Surgeon: Maryjane Mt, MD;  Location: [facility] OR;  Service: Open  Heart Surgery;  Laterality: N/A;   MITRAL VALVE REPAIR N/A 12/08/2022   Procedure: MITRAL VALVE REPAIR (MVR) USING 34 MM SIMULUS SEMI- RIGID ANNULOPLASTY BAND;  Surgeon: Maryjane Mt, MD;  Location: [facility] OR;  Service: Open Heart Surgery;  Laterality: N/A;   RETINAL DETACHMENT SURGERY  03/07/1995   RIGHT/LEFT HEART CATH AND CORONARY ANGIOGRAPHY N/A 10/12/2022   Procedure: RIGHT/LEFT HEART CATH AND CORONARY ANGIOGRAPHY;  Surgeon: Wendel Lurena POUR, MD;  Location: [facility];  Service: Cardiovascular;  Laterality: N/A;   SKIN CANCER EXCISION  2012, 1999   basal cell - neck and hand   TEE WITHOUT CARDIOVERSION N/A 10/16/2022   Procedure: TRANSESOPHAGEAL ECHOCARDIOGRAM;  Surgeon: Santo Stanly LABOR, MD;  Location: [facility];  Service: Cardiovascular;  Laterality: N/A;   TEE WITHOUT CARDIOVERSION N/A 12/08/2022   Procedure: TRANSESOPHAGEAL ECHOCARDIOGRAM;  Surgeon: Maryjane Mt, MD;  Location: [facility] OR;  Service: Open Heart Surgery;  Laterality: N/A;   TOTAL HIP ARTHROPLASTY Left 09/05/2023   Procedure: ARTHROPLASTY, HIP, TOTAL, ANTERIOR APPROACH;  Surgeon: Jerri Kay CHRISTELLA, MD;  Location: [facility] OR;  Service: Orthopedics;  Laterality: Left;   Patient Active Problem List   Diagnosis Date Noted   Hypokalemia 02/04/2024   Acute encephalopathy 02/03/2024   Gait abnormality 01/14/2024   Chronic low back  pain without sciatica 01/14/2024   Malnutrition of moderate degree 09/05/2023   Subcapital fracture of neck of left femur, closed, initial encounter (HCC) 09/04/2023   Fall at home, initial encounter 09/04/2023   Left ankle injury, initial encounter 07/07/2023   Acute left ankle pain 07/07/2023   History of essential hypertension 07/07/2023   Hypercoagulable state due to paroxysmal atrial fibrillation (HCC) 06/21/2023   S/P MVR (mitral valve repair) 12/08/2022   Protein-calorie malnutrition, severe 08/19/2022   Alcohol withdrawal (HCC) 08/14/2022   Acute metabolic encephalopathy 08/14/2022    Paroxysmal atrial fibrillation (HCC) 08/14/2022   Nonrheumatic mitral valve regurgitation 08/14/2022   Mitral valve prolapse 08/14/2022   PNA (pneumonia) 08/12/2022   Severe sepsis (HCC) 08/12/2022   HLD (hyperlipidemia) 08/12/2022   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   Trigeminal neuralgia of left side of face 07/15/2019   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   Hypertension 10/31/2013   Pain in joint, shoulder region 04/14/2013   Nodule of right lung 11/07/2011    ONSET DATE: months  REFERRING DIAG: R26.9 (ICD-10-CM) - Gait abnormality M54.50,G89.29 (ICD-10-CM) - Chronic low back pain without sciatica, unspecified back pain laterality  THERAPY DIAG:  Unsteadiness on feet  Muscle weakness (generalized)  Other abnormalities of gait and mobility  Rationale for Evaluation and Treatment: Rehabilitation  SUBJECTIVE:                                                                                                                                                                                             SUBJECTIVE STATEMENT: Aching a little this morning. Was up on my feet a lot today.    Pt accompanied by: self  PERTINENT HISTORY: A-fib, HLD, HTN, trigeminal neuralgia, mitral valve repair 2024, L THA Hospitalized 02/03/2024-02/05/24 with AMS, found to have acute encephalopathy.   PAIN:  Are you having pain? Yes: NPRS scale: 3/10 Pain location: generalized Pain description: ache Aggravating factors: increased activity  Relieving factors: nothing  PRECAUTIONS: Fall  RED FLAGS: None   WEIGHT BEARING RESTRICTIONS: No  FALLS: Has patient fallen in last 6 months? No  LIVING ENVIRONMENT: Lives with: lives with their spouse Lives in: House/apartment Stairs: 7-8 steps to enter with rail; bedroom on 1st floor Has following equipment at home: Single point cane, Environmental Consultant - 4 wheeled, Tour manager, and Grab bars  PLOF: Independent, Vocation/Vocational  requirements: retired, and Leisure: guitar and walking but has stopped these  PATIENT GOALS: improve balance  OBJECTIVE:      TODAY'S TREATMENT: 02/27/24 Activity Comments  Nustep L3 x 6 min UEs/LEs  To address c/o generalized pain. Pt reports  resolution of pain   DGI 14/24  Gait training with SPC Manual, then verbal cueing for timing and proper sequencing. Improved with practice   elevated STS on hi-lo mat, then from standard chair First pushing off knees, then able to perform without UE support with cues to reach down, then stand up          [facility] PT Assessment - 02/27/24 0001       Standardized Balance Assessment   Standardized Balance Assessment Dynamic Gait Index      Dynamic Gait Index   Level Surface Moderate Impairment    Change in Gait Speed Mild Impairment    Gait with Horizontal Head Turns Mild Impairment    Gait with Vertical Head Turns Mild Impairment    Gait and Pivot Turn Mild Impairment    Step Over Obstacle Moderate Impairment    Step Around Obstacles Mild Impairment    Steps Mild Impairment    Total Score 14           HOME EXERCISE PROGRAM Last updated: 02/27/24 Access Code: [email or web address] URL: [email or web address] Date: 02/27/2024 Prepared by: [facility] - Outpatient  Rehab - [facility]  Exercises - Standing Gastroc Stretch at Counter  - 1 x daily - 5 x weekly - 2 sets - 30 sec hold - Heel Toe Raises with Counter Support  - 1 x daily - 5 x weekly - 2 sets - 10 reps - Mini Squat with Counter Support  - 1 x daily - 5 x weekly - 2 sets - 10 reps - Sit to Stand Without Arm Support  - 1 x daily - 5 x weekly - 2 sets - 10 reps  PATIENT EDUCATION: Education details: edu on exam findings and increased fall risk- encouraged use of SPC, HEP update Person educated: Patient Education method: Explanation, Demonstration, Tactile cues, Verbal cues, and Handouts Education comprehension: verbalized understanding and returned  demonstration    Note: Objective measures were completed at Evaluation unless otherwise noted.  DIAGNOSTIC FINDINGS: 01/29/24  MRI scan of the lumbar spine showing mild disc and prominent facet degenerative changes most prominent at L4-5 where there is mild right-sided foraminal narrowing.  02/04/24 brain MRI: Interval resolution of restricted diffusion within the left hippocampus. 2. Moderate diffuse cerebral volume loss. 3. 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  COGNITION: Overall cognitive status: Within functional limits for tasks assessed   SENSATION: Intact in B LEs to light touch/pressure  COORDINATION: Alternating pronation/supination: WNL B Alternating toe tap: WNL B Finger to nose: slight dysmetria and intention tremor B   MUSCLE TONE: WNL B LEs  POSTURE: rounded shoulders and forward head  LOWER EXTREMITY ROM:     Active  Right Eval Left Eval  Hip flexion    Hip extension    Hip abduction    Hip adduction    Hip internal rotation    Hip external rotation    Knee flexion    Knee extension    Ankle dorsiflexion 6 4  Ankle plantarflexion    Ankle inversion    Ankle eversion     (Blank rows = not tested)  LOWER EXTREMITY MMT:    MMT (in sitting) Right Eval Left Eval  Hip flexion 3+ 4  Hip extension    Hip abduction 4+ 4  Hip adduction 4 4  Hip internal rotation    Hip external rotation    Knee flexion 4+ 4  Knee extension 4+ 4-  Ankle dorsiflexion 3+ 3+  Ankle plantarflexion 2+ Unable to complete 1 full rep in SLS 2+ Unable to complete 1 full rep in SLS  Ankle inversion    Ankle eversion    (Blank rows = not tested)   GAIT: Findings: Assistive device utilized:None, Level of assistance: Modified independence and SBA, and Comments: B reduced foot clearance and feet scuffing, slight imbalance, and slight posterior trunk lean  FUNCTIONAL TESTS:  5 times sit to stand: 13.37 sec with B UEs  10 meter walk  test: 11.87 sec (2.76 ft/sec) Patient unable to stand without UE support                                                                                                                               TREATMENT DATE: 02/22/24    PATIENT EDUCATION: Education details: prognosis, POC, edu on exam findings and relevance to pt's functional impairments, HEP Person educated: Patient Education method: Explanation, Demonstration, Tactile cues, Verbal cues, and Handouts Education comprehension: verbalized understanding and returned demonstration  HOME EXERCISE PROGRAM: Access Code: [email or web address] URL: [email or web address] Date: 02/22/2024 Prepared by: [facility] - Outpatient  Rehab - [facility]  Exercises - Standing Gastroc Stretch at Counter  - 1 x daily - 5 x weekly - 2 sets - 30 sec hold - Heel Toe Raises with Counter Support  - 1 x daily - 5 x weekly - 2 sets - 10 reps - Mini Squat with Counter Support  - 1 x daily - 5 x weekly - 2 sets - 10 reps   GOALS: Goals reviewed with patient? Yes  SHORT TERM GOALS: Target date: 03/14/2024  Patient to be independent with initial HEP. Baseline: HEP initiated Goal status: IN PROGRESS    LONG TERM GOALS: Target date: 04/04/2024  Patient to be independent with advanced HEP. Baseline: Not yet initiated  Goal status: IN PROGRESS  Patient to score at least 20/24 on DGI in order to decrease risk of falls.  Baseline: NT Goal status: IN PROGRESS  Patient to demonstrate gait speed of at least 3.0 ft/sec in order to improve access to community.  Baseline: 2.76 ft/sec Goal status: IN PROGRESS  Patient to demonstrate STS without UE support. Baseline: unable Goal status: IN PROGRESS  Patient to report plans to participate in home or community exercise regimen to maintain fitness.  Baseline: not performing Goal status: IN PROGRESS  ASSESSMENT:  CLINICAL IMPRESSION:  Patient arrived to session with report of generalized pain  after increased activity yesterday. Patient scored 14/24 on DGI, indicating an increased risk of falls. Gait training with SPC was initiated for safety and patient responded well for timing and sequencing.  Proceeded with transfer training from elevated seat for max success, however with cueing patient was able to stand without UE support. Patient tolerated session well and without complaints at end of appointment.   OBJECTIVE IMPAIRMENTS: Abnormal gait, decreased activity tolerance, decreased balance, decreased ROM, decreased strength, and postural dysfunction.   ACTIVITY LIMITATIONS:  carrying, lifting, bending, standing, squatting, stairs, transfers, bathing, dressing, and locomotion level  PARTICIPATION LIMITATIONS: meal prep, cleaning, laundry, shopping, community activity, and church  PERSONAL FACTORS: Age, Fitness, Past/current experiences, Time since onset of injury/illness/exacerbation, and 3+ comorbidities: A-fib, HLD, HTN, trigeminal neuralgia, mitral valve repair 2024, L THA are also affecting patient's functional outcome.   REHAB POTENTIAL: Good  CLINICAL DECISION MAKING: Evolving/moderate complexity  EVALUATION COMPLEXITY: Moderate  PLAN:  PT FREQUENCY: 2x/week  PT DURATION: 6 weeks  PLANNED INTERVENTIONS: 97164- PT Re-evaluation, 97110-Therapeutic exercises, 97530- Therapeutic activity, 97112- Neuromuscular re-education, 97535- Self Care, [id number]- Manual therapy, [phone]- Gait training, [phone]- Canalith repositioning, [id number]- Electrical stimulation (manual), [phone] (1-2 muscles), [id number] (3+ muscles)- Dry Needling, Patient/Family education, Balance training, Stair training, Taping, Joint mobilization, Spinal mobilization, Vestibular training, Cryotherapy, and Moist heat  PLAN FOR NEXT SESSION: review HEP and progress for LE strength, ankle control and strategy, STS transfers    Louana Terrilyn Christians, [place], DPT 02/27/2024 10:59 AM  [facility] at [facility] [address], [address] [place], [place] [address] Phone # [phone] Fax # [phone]        "

## 2024-02-27 ENCOUNTER — Ambulatory Visit: Admitting: Physical Therapy

## 2024-02-27 ENCOUNTER — Encounter: Payer: Self-pay | Admitting: Physical Therapy

## 2024-02-27 DIAGNOSIS — R2681 Unsteadiness on feet: Secondary | ICD-10-CM | POA: Diagnosis not present

## 2024-02-27 DIAGNOSIS — R2689 Other abnormalities of gait and mobility: Secondary | ICD-10-CM

## 2024-02-27 DIAGNOSIS — M6281 Muscle weakness (generalized): Secondary | ICD-10-CM

## 2024-03-07 NOTE — Therapy (Signed)
 " OUTPATIENT PHYSICAL THERAPY NEURO TREATMENT   Patient Name: James Ibarra MRN: [id number] DOB:[birth year], 74 y.o., male Today's Date: 03/10/2024   PCP:  Kip Righter, MD     REFERRING PROVIDER: Onita Duos, MD  END OF SESSION:  PT End of Session - 03/10/24 1227     Visit Number 3    Number of Visits 13    Date for Recertification  04/04/24    Authorization Type [employer]    Authorization Time Period approved 13 PT visits from 02/22/24-04/11/24    Authorization - Visit Number 3    Authorization - Number of Visits 13    PT Start Time 1145    PT Stop Time 1228    PT Time Calculation (min) 43 min    Equipment Utilized During Treatment Gait belt    Activity Tolerance Patient tolerated treatment well    Behavior During Therapy WFL for tasks assessed/performed            Past Medical History:  Diagnosis Date   Atrial fibrillation (HCC)    Cancer (HCC)    Skin Cancer - Scalp, Right neck, Left Hand   Dysrhythmia    A. Fib while hospitalized June 2024   Heart murmur    History of nonmelanoma skin cancer    Hypercholesteremia    Hypertension    Pneumonia 08/2022   Trigeminal neuralgia    Past Surgical History:  Procedure Laterality Date   ATRIAL FIBRILLATION ABLATION N/A 05/24/2023   Procedure: ATRIAL FIBRILLATION ABLATION;  Surgeon: Nancey Eulas BRAVO, MD;  Location: [facility];  Service: Cardiovascular;  Laterality: N/A;   CATARACT EXTRACTION W/ INTRAOCULAR LENS IMPLANT Bilateral    CLIPPING OF ATRIAL APPENDAGE Left 12/08/2022   Procedure: CLIPPING OF ATRIAL APPENDAGE (LAA) USING 50 MM ATRICLIP;  Surgeon: Maryjane Mt, MD;  Location: [facility] OR;  Service: Open Heart Surgery;  Laterality: Left;   INGUINAL HERNIA REPAIR  11/07/2011   Procedure: LAPAROSCOPIC INGUINAL HERNIA;  Surgeon: Camellia CHRISTELLA Blush, MD,FACS;  Location: [facility];  Service: General;  Laterality: Left;   MAZE N/A 12/08/2022   Procedure: MAZE;  Surgeon: Maryjane Mt, MD;  Location: [facility] OR;  Service: Open  Heart Surgery;  Laterality: N/A;   MITRAL VALVE REPAIR N/A 12/08/2022   Procedure: MITRAL VALVE REPAIR (MVR) USING 34 MM SIMULUS SEMI- RIGID ANNULOPLASTY BAND;  Surgeon: Maryjane Mt, MD;  Location: [facility] OR;  Service: Open Heart Surgery;  Laterality: N/A;   RETINAL DETACHMENT SURGERY  03/07/1995   RIGHT/LEFT HEART CATH AND CORONARY ANGIOGRAPHY N/A 10/12/2022   Procedure: RIGHT/LEFT HEART CATH AND CORONARY ANGIOGRAPHY;  Surgeon: Wendel Lurena POUR, MD;  Location: [facility];  Service: Cardiovascular;  Laterality: N/A;   SKIN CANCER EXCISION  2012, 1999   basal cell - neck and hand   TEE WITHOUT CARDIOVERSION N/A 10/16/2022   Procedure: TRANSESOPHAGEAL ECHOCARDIOGRAM;  Surgeon: Santo Stanly LABOR, MD;  Location: [facility];  Service: Cardiovascular;  Laterality: N/A;   TEE WITHOUT CARDIOVERSION N/A 12/08/2022   Procedure: TRANSESOPHAGEAL ECHOCARDIOGRAM;  Surgeon: Maryjane Mt, MD;  Location[facility];  Service: Open Heart Surgery;  Laterality: N/A;   TOTAL HIP ARTHROPLASTY Left 09/05/2023   Procedure: ARTHROPLASTY, HIP, TOTAL, ANTERIOR APPROACH;  Surgeon: Jerri Kay CHRISTELLA, MD;  Location: [facility] OR;  Service: Orthopedics;  Laterality: Left;   Patient Active Problem List   Diagnosis Date Noted   Hypokalemia 02/04/2024   Acute encephalopathy 02/03/2024   Gait abnormality 01/14/2024   Chronic low  back pain without sciatica 01/14/2024   Malnutrition of moderate degree 09/05/2023   Subcapital fracture of neck of left femur, closed, initial encounter (HCC) 09/04/2023   Fall at home, initial encounter 09/04/2023   Left ankle injury, initial encounter 07/07/2023   Acute left ankle pain 07/07/2023   History of essential hypertension 07/07/2023   Hypercoagulable state due to paroxysmal atrial fibrillation (HCC) 06/21/2023   S/P MVR (mitral valve repair) 12/08/2022   Protein-calorie malnutrition, severe 08/19/2022   Alcohol withdrawal (HCC) 08/14/2022   Acute metabolic encephalopathy 08/14/2022    Paroxysmal atrial fibrillation (HCC) 08/14/2022   Nonrheumatic mitral valve regurgitation 08/14/2022   Mitral valve prolapse 08/14/2022   PNA (pneumonia) 08/12/2022   Severe sepsis (HCC) 08/12/2022   HLD (hyperlipidemia) 08/12/2022   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   Trigeminal neuralgia of left side of face 07/15/2019   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   Hypertension 10/31/2013   Pain in joint, shoulder region 04/14/2013   Nodule of right lung 11/07/2011    ONSET DATE: months  REFERRING DIAG: R26.9 (ICD-10-CM) - Gait abnormality M54.50,G89.29 (ICD-10-CM) - Chronic low back pain without sciatica, unspecified back pain laterality  THERAPY DIAG:  Unsteadiness on feet  Muscle weakness (generalized)  Other abnormalities of gait and mobility  Rationale for Evaluation and Treatment: Rehabilitation  SUBJECTIVE:                                                                                                                                                                                             SUBJECTIVE STATEMENT: Nothing new. Forgot his cane at home but thinks it needs to be adjusted. Got a new puppy.   Pt accompanied by: self  PERTINENT HISTORY: A-fib, HLD, HTN, trigeminal neuralgia, mitral valve repair 2024, L THA Hospitalized 02/03/2024-02/05/24 with AMS, found to have acute encephalopathy.   PAIN:  Are you having pain? Yes: NPRS scale: 3/10 Pain location: generalized Pain description: ache Aggravating factors: increased activity  Relieving factors: nothing  PRECAUTIONS: Fall  RED FLAGS: None   WEIGHT BEARING RESTRICTIONS: No  FALLS: Has patient fallen in last 6 months? No  LIVING ENVIRONMENT: Lives with: lives with their spouse Lives in: House/apartment Stairs: 7-8 steps to enter with rail; bedroom on 1st floor Has following equipment at home: Single point cane, Environmental Consultant - 4 wheeled, Tour manager, and Grab bars  PLOF:  Independent, Vocation/Vocational requirements: retired, and Leisure: guitar and walking but has stopped these  PATIENT GOALS: improve balance  OBJECTIVE:     TODAY'S TREATMENT: 03/10/24 Activity Comments  gait training w/ SPC, then quad tip cane Good sequencing.  Cueing to reduce Wbing through cane as this resulted in UE wobble. Best stability with quad tip   STS with green medball 5x Cueing for eccentric lower. Pt completed about 5 before demonstrating LE fatigue  Sitting fwd trunk lean and reach To prime for transfer  Sitting fwd trunk lean and reach + STS 10x Improved success   standing ant/pos wt shift, then EC Good self-correction and stability   standing on foam EO romberg: Green medball small side to side toss, vertical toss, trunk twist CGA for safety  Standing EC on foam  SBA for safety   sidestepping over hurdles 3x30 Mild instability and occasional CGA-min A to prevent LOB     PATIENT EDUCATION: Education details: edu on obtaining quad tip for SPC, encouraged improved HEP complianme  Person educated: Patient Education method: Explanation, Demonstration, Tactile cues, Verbal cues, and Handouts Education comprehension: verbalized understanding and returned demonstration   HOME EXERCISE PROGRAM Last updated: 02/27/24 Access Code: [email or web address] URL: [email or web address] Date: 02/27/2024 Prepared by: [facility] - Outpatient  Rehab - [facility]  Exercises - Standing Gastroc Stretch at Counter  - 1 x daily - 5 x weekly - 2 sets - 30 sec hold - Heel Toe Raises with Counter Support  - 1 x daily - 5 x weekly - 2 sets - 10 reps - Mini Squat with Counter Support  - 1 x daily - 5 x weekly - 2 sets - 10 reps - Sit to Stand Without Arm Support  - 1 x daily - 5 x weekly - 2 sets - 10 reps     Note: Objective measures were completed at Evaluation unless otherwise noted.  DIAGNOSTIC FINDINGS: 01/29/24  MRI scan of the lumbar spine showing mild disc and prominent  facet degenerative changes most prominent at L4-5 where there is mild right-sided foraminal narrowing.  02/04/24 brain MRI: Interval resolution of restricted diffusion within the left hippocampus. 2. Moderate diffuse cerebral volume loss. 3. 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  COGNITION: Overall cognitive status: Within functional limits for tasks assessed   SENSATION: Intact in B LEs to light touch/pressure  COORDINATION: Alternating pronation/supination: WNL B Alternating toe tap: WNL B Finger to nose: slight dysmetria and intention tremor B   MUSCLE TONE: WNL B LEs  POSTURE: rounded shoulders and forward head  LOWER EXTREMITY ROM:     Active  Right Eval Left Eval  Hip flexion    Hip extension    Hip abduction    Hip adduction    Hip internal rotation    Hip external rotation    Knee flexion    Knee extension    Ankle dorsiflexion 6 4  Ankle plantarflexion    Ankle inversion    Ankle eversion     (Blank rows = not tested)  LOWER EXTREMITY MMT:    MMT (in sitting) Right Eval Left Eval  Hip flexion 3+ 4  Hip extension    Hip abduction 4+ 4  Hip adduction 4 4  Hip internal rotation    Hip external rotation    Knee flexion 4+ 4  Knee extension 4+ 4-  Ankle dorsiflexion 3+ 3+  Ankle plantarflexion 2+ Unable to complete 1 full rep in SLS 2+ Unable to complete 1 full rep in SLS  Ankle inversion    Ankle eversion    (Blank rows = not tested)   GAIT: Findings: Assistive device utilized:None, Level of assistance: Modified independence and SBA, and Comments: B reduced foot clearance  and feet scuffing, slight imbalance, and slight posterior trunk lean  FUNCTIONAL TESTS:  5 times sit to stand: 13.37 sec with B UEs  10 meter walk test: 11.87 sec (2.76 ft/sec) Patient unable to stand without UE support                                                                                                                                TREATMENT DATE: 02/22/24    PATIENT EDUCATION: Education details: prognosis, POC, edu on exam findings and relevance to pt's functional impairments, HEP Person educated: Patient Education method: Explanation, Demonstration, Tactile cues, Verbal cues, and Handouts Education comprehension: verbalized understanding and returned demonstration  HOME EXERCISE PROGRAM: Access Code: [email or web address] URL: [email or web address] Date: 02/22/2024 Prepared by: [facility] - Outpatient  Rehab - [facility]  Exercises - Standing Gastroc Stretch at Counter  - 1 x daily - 5 x weekly - 2 sets - 30 sec hold - Heel Toe Raises with Counter Support  - 1 x daily - 5 x weekly - 2 sets - 10 reps - Mini Squat with Counter Support  - 1 x daily - 5 x weekly - 2 sets - 10 reps   GOALS: Goals reviewed with patient? Yes  SHORT TERM GOALS: Target date: 03/14/2024  Patient to be independent with initial HEP. Baseline: HEP initiated Goal status: IN PROGRESS    LONG TERM GOALS: Target date: 04/04/2024  Patient to be independent with advanced HEP. Baseline: Not yet initiated  Goal status: IN PROGRESS  Patient to score at least 20/24 on DGI in order to decrease risk of falls.  Baseline: NT Goal status: IN PROGRESS  Patient to demonstrate gait speed of at least 3.0 ft/sec in order to improve access to community.  Baseline: 2.76 ft/sec Goal status: IN PROGRESS  Patient to demonstrate STS without UE support. Baseline: unable Goal status: IN PROGRESS  Patient to report plans to participate in home or community exercise regimen to maintain fitness.  Baseline: not performing Goal status: IN PROGRESS  ASSESSMENT:  CLINICAL IMPRESSION:  Patient arrived to session without complaints. Session focused on review of gait training with cane and patient today with best stability with addition of quad cane tip. Transfer training revealed limited anterior trunk lean, resulting in quick LE fatigue.  Worked on improved body mechanics to address this, with improved transfer ability evident after practice. Balance tasks focused on ankle strategy and stepping strategy with more difficulty with stepping. Patient tolerated session well and without complaints at end of appointment.   OBJECTIVE IMPAIRMENTS: Abnormal gait, decreased activity tolerance, decreased balance, decreased ROM, decreased strength, and postural dysfunction.   ACTIVITY LIMITATIONS: carrying, lifting, bending, standing, squatting, stairs, transfers, bathing, dressing, and locomotion level  PARTICIPATION LIMITATIONS: meal prep, cleaning, laundry, shopping, community activity, and church  PERSONAL FACTORS: Age, Fitness, Past/current experiences, Time since onset of injury/illness/exacerbation, and 3+ comorbidities: A-fib, HLD, HTN, trigeminal neuralgia, mitral valve repair 2024, L THA  are also affecting patient's functional outcome.   REHAB POTENTIAL: Good  CLINICAL DECISION MAKING: Evolving/moderate complexity  EVALUATION COMPLEXITY: Moderate  PLAN:  PT FREQUENCY: 2x/week  PT DURATION: 6 weeks  PLANNED INTERVENTIONS: 97164- PT Re-evaluation, 97110-Therapeutic exercises, 97530- Therapeutic activity, 97112- Neuromuscular re-education, 97535- Self Care, [id number]- Manual therapy, [phone]- Gait training, [phone]- Canalith repositioning, [id number]- Electrical stimulation (manual), [phone] (1-2 muscles), [id number] (3+ muscles)- Dry Needling, Patient/Family education, Balance training, Stair training, Taping, Joint mobilization, Spinal mobilization, Vestibular training, Cryotherapy, and Moist heat  PLAN FOR NEXT SESSION: review HEP and progress for LE strength, ankle control and strategy, STS transfers; obstacle negotiation as pt just got a new puppy   Louana Terrilyn Christians, PT, DPT 03/10/2024 12:29 PM  [facility] at [facility] [address] [place], [place] [address] Phone # [phone] Fax #  [phone]        "

## 2024-03-10 ENCOUNTER — Ambulatory Visit: Attending: Neurology | Admitting: Physical Therapy

## 2024-03-10 ENCOUNTER — Encounter: Payer: Self-pay | Admitting: Physical Therapy

## 2024-03-10 DIAGNOSIS — R293 Abnormal posture: Secondary | ICD-10-CM | POA: Diagnosis present

## 2024-03-10 DIAGNOSIS — R2681 Unsteadiness on feet: Secondary | ICD-10-CM | POA: Diagnosis present

## 2024-03-10 DIAGNOSIS — R2689 Other abnormalities of gait and mobility: Secondary | ICD-10-CM | POA: Insufficient documentation

## 2024-03-10 DIAGNOSIS — M6281 Muscle weakness (generalized): Secondary | ICD-10-CM | POA: Diagnosis present

## 2024-03-12 ENCOUNTER — Ambulatory Visit

## 2024-03-12 DIAGNOSIS — M6281 Muscle weakness (generalized): Secondary | ICD-10-CM

## 2024-03-12 DIAGNOSIS — R2681 Unsteadiness on feet: Secondary | ICD-10-CM | POA: Diagnosis not present

## 2024-03-12 DIAGNOSIS — R293 Abnormal posture: Secondary | ICD-10-CM

## 2024-03-12 DIAGNOSIS — R2689 Other abnormalities of gait and mobility: Secondary | ICD-10-CM

## 2024-03-12 NOTE — Therapy (Signed)
 " OUTPATIENT PHYSICAL THERAPY NEURO TREATMENT   Patient Name: James Ibarra MRN: [id number] DOB:1950-09-23, 74 y.o., male Today's Date: 03/12/2024   PCP:  Kip Righter, MD     REFERRING PROVIDER: Onita Duos, MD  END OF SESSION:  PT End of Session - 03/12/24 1022     Visit Number 4    Number of Visits 13    Date for Recertification  04/04/24    Authorization Type [employer] [facility]    Authorization Time Period approved 13 PT visits from 02/22/24-04/11/24    Authorization - Visit Number 4    Authorization - Number of Visits 13    PT Start Time 1020    PT Stop Time 1100    PT Time Calculation (min) 40 min    Equipment Utilized During Treatment Gait belt    Activity Tolerance Patient tolerated treatment well    Behavior During Therapy WFL for tasks assessed/performed            Past Medical History:  Diagnosis Date   Atrial fibrillation (HCC)    Cancer (HCC)    Skin Cancer - Scalp, Right neck, Left Hand   Dysrhythmia    A. Fib while hospitalized June 2024   Heart murmur    History of nonmelanoma skin cancer    Hypercholesteremia    Hypertension    Pneumonia 08/2022   Trigeminal neuralgia    Past Surgical History:  Procedure Laterality Date   ATRIAL FIBRILLATION ABLATION N/A 05/24/2023   Procedure: ATRIAL FIBRILLATION ABLATION;  Surgeon: Nancey Eulas BRAVO, MD;  Location: [facility];  Service: Cardiovascular;  Laterality: N/A;   CATARACT EXTRACTION W/ INTRAOCULAR LENS IMPLANT Bilateral    CLIPPING OF ATRIAL APPENDAGE Left 12/08/2022   Procedure: CLIPPING OF ATRIAL APPENDAGE (LAA) USING 50 MM ATRICLIP;  Surgeon: Maryjane Mt, MD;  Location: [facility] OR;  Service: Open Heart Surgery;  Laterality: Left;   INGUINAL HERNIA REPAIR  11/07/2011   Procedure: LAPAROSCOPIC INGUINAL HERNIA;  Surgeon: Camellia CHRISTELLA Blush, MD,FACS;  Location: [facility];  Service: General;  Laterality: Left;   MAZE N/A 12/08/2022   Procedure: MAZE;  Surgeon: Maryjane Mt, MD;  Location: [facility] OR;  Service: Open  Heart Surgery;  Laterality: N/A;   MITRAL VALVE REPAIR N/A 12/08/2022   Procedure: MITRAL VALVE REPAIR (MVR) USING 34 MM SIMULUS SEMI- RIGID ANNULOPLASTY BAND;  Surgeon: Maryjane Mt, MD;  Location: [facility] OR;  Service: Open Heart Surgery;  Laterality: N/A;   RETINAL DETACHMENT SURGERY  03/07/1995   RIGHT/LEFT HEART CATH AND CORONARY ANGIOGRAPHY N/A 10/12/2022   Procedure: RIGHT/LEFT HEART CATH AND CORONARY ANGIOGRAPHY;  Surgeon: Wendel Lurena POUR, MD;  Location: [facility];  Service: Cardiovascular;  Laterality: N/A;   SKIN CANCER EXCISION  2012, 1999   basal cell - neck and hand   TEE WITHOUT CARDIOVERSION N/A 10/16/2022   Procedure: TRANSESOPHAGEAL ECHOCARDIOGRAM;  Surgeon: Santo Stanly LABOR, MD;  Location: [facility];  Service: Cardiovascular;  Laterality: N/A;   TEE WITHOUT CARDIOVERSION N/A 12/08/2022   Procedure: TRANSESOPHAGEAL ECHOCARDIOGRAM;  Surgeon: Maryjane Mt, MD;  Location: [facility] OR;  Service: Open Heart Surgery;  Laterality: N/A;   TOTAL HIP ARTHROPLASTY Left 09/05/2023   Procedure: ARTHROPLASTY, HIP, TOTAL, ANTERIOR APPROACH;  Surgeon: Jerri Kay CHRISTELLA, MD;  Location: [facility] OR;  Service: Orthopedics;  Laterality: Left;   Patient Active Problem List   Diagnosis Date Noted   Hypokalemia 02/04/2024   Acute encephalopathy 02/03/2024   Gait abnormality 01/14/2024   Chronic low  back pain without sciatica 01/14/2024   Malnutrition of moderate degree 09/05/2023   Subcapital fracture of neck of left femur, closed, initial encounter (HCC) 09/04/2023   Fall at home, initial encounter 09/04/2023   Left ankle injury, initial encounter 07/07/2023   Acute left ankle pain 07/07/2023   History of essential hypertension 07/07/2023   Hypercoagulable state due to paroxysmal atrial fibrillation (HCC) 06/21/2023   S/P MVR (mitral valve repair) 12/08/2022   Protein-calorie malnutrition, severe 08/19/2022   Alcohol withdrawal (HCC) 08/14/2022   Acute metabolic encephalopathy 08/14/2022    Paroxysmal atrial fibrillation (HCC) 08/14/2022   Nonrheumatic mitral valve regurgitation 08/14/2022   Mitral valve prolapse 08/14/2022   PNA (pneumonia) 08/12/2022   Severe sepsis (HCC) 08/12/2022   HLD (hyperlipidemia) 08/12/2022   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   Trigeminal neuralgia of left side of face 07/15/2019   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   Hypertension 10/31/2013   Pain in joint, shoulder region 04/14/2013   Nodule of right lung 11/07/2011    ONSET DATE: months  REFERRING DIAG: R26.9 (ICD-10-CM) - Gait abnormality M54.50,G89.29 (ICD-10-CM) - Chronic low back pain without sciatica, unspecified back pain laterality  THERAPY DIAG:  Unsteadiness on feet  Muscle weakness (generalized)  Other abnormalities of gait and mobility  Abnormal posture  Balance problem  Rationale for Evaluation and Treatment: Rehabilitation  SUBJECTIVE:                                                                                                                                                                                             SUBJECTIVE STATEMENT: Nothing new. Forgot his cane at home but thinks it needs to be adjusted. Got a new puppy.   Pt accompanied by: self  PERTINENT HISTORY: A-fib, HLD, HTN, trigeminal neuralgia, mitral valve repair 2024, L THA Hospitalized 02/03/2024-02/05/24 with AMS, found to have acute encephalopathy.   PAIN:  Are you having pain? Yes: NPRS scale: 3/10 Pain location: generalized Pain description: ache Aggravating factors: increased activity  Relieving factors: nothing  PRECAUTIONS: Fall  RED FLAGS: None   WEIGHT BEARING RESTRICTIONS: No  FALLS: Has patient fallen in last 6 months? No  LIVING ENVIRONMENT: Lives with: lives with their spouse Lives in: House/apartment Stairs: 7-8 steps to enter with rail; bedroom on 1st floor Has following equipment at home: Single point cane, Environmental Consultant - 4 wheeled, Nurse, mental health, and Grab bars  PLOF: Independent, Vocation/Vocational requirements: retired, and Leisure: guitar and walking but has stopped these  PATIENT GOALS: improve balance  OBJECTIVE:   TODAY'S TREATMENT: 03/13/23 Activity Comments  HEP review Good performance and recall  Gait  training Trials w/ cane various surfaces and stairs modified indep  Single leg stance drill 1x10 Lift, hover, drop on stair  Dynamic balance Retrowalk, sidestep x 2 min supervision   Static multisensory balance Difficulty with compliant surfaces and with eyes closed conditions          TODAY'S TREATMENT: 03/10/24 Activity Comments  gait training w/ SPC, then quad tip cane Good sequencing. Cueing to reduce Wbing through cane as this resulted in UE wobble. Best stability with quad tip   STS with green medball 5x Cueing for eccentric lower. Pt completed about 5 before demonstrating LE fatigue  Sitting fwd trunk lean and reach To prime for transfer  Sitting fwd trunk lean and reach + STS 10x Improved success   standing ant/pos wt shift, then EC Good self-correction and stability   standing on foam EO romberg: Green medball small side to side toss, vertical toss, trunk twist CGA for safety  Standing EC on foam  SBA for safety   sidestepping over hurdles 3x30 Mild instability and occasional CGA-min A to prevent LOB     PATIENT EDUCATION: Education details: edu on obtaining quad tip for SPC, encouraged improved HEP complianme  Person educated: Patient Education method: Explanation, Demonstration, Tactile cues, Verbal cues, and Handouts Education comprehension: verbalized understanding and returned demonstration   HOME EXERCISE PROGRAM Last updated: 02/27/24 Access Code: [email or web address] URL: [email or web address] Date: 02/27/2024 Prepared by: [facility] - Outpatient  Rehab - [facility]  Exercises - Standing Gastroc Stretch at Counter  - 1 x daily - 5 x weekly - 2 sets - 30 sec hold - Heel Toe  Raises with Counter Support  - 1 x daily - 5 x weekly - 2 sets - 10 reps - Mini Squat with Counter Support  - 1 x daily - 5 x weekly - 2 sets - 10 reps - Sit to Stand Without Arm Support  - 1 x daily - 5 x weekly - 2 sets - 10 reps     Note: Objective measures were completed at Evaluation unless otherwise noted.  DIAGNOSTIC FINDINGS: 01/29/24  MRI scan of the lumbar spine showing mild disc and prominent facet degenerative changes most prominent at L4-5 where there is mild right-sided foraminal narrowing.  02/04/24 brain MRI: Interval resolution of restricted diffusion within the left hippocampus. 2. Moderate diffuse cerebral volume loss. 3. 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  COGNITION: Overall cognitive status: Within functional limits for tasks assessed   SENSATION: Intact in B LEs to light touch/pressure  COORDINATION: Alternating pronation/supination: WNL B Alternating toe tap: WNL B Finger to nose: slight dysmetria and intention tremor B   MUSCLE TONE: WNL B LEs  POSTURE: rounded shoulders and forward head  LOWER EXTREMITY ROM:     Active  Right Eval Left Eval  Hip flexion    Hip extension    Hip abduction    Hip adduction    Hip internal rotation    Hip external rotation    Knee flexion    Knee extension    Ankle dorsiflexion 6 4  Ankle plantarflexion    Ankle inversion    Ankle eversion     (Blank rows = not tested)  LOWER EXTREMITY MMT:    MMT (in sitting) Right Eval Left Eval  Hip flexion 3+ 4  Hip extension    Hip abduction 4+ 4  Hip adduction 4 4  Hip internal rotation    Hip external rotation  Knee flexion 4+ 4  Knee extension 4+ 4-  Ankle dorsiflexion 3+ 3+  Ankle plantarflexion 2+ Unable to complete 1 full rep in SLS 2+ Unable to complete 1 full rep in SLS  Ankle inversion    Ankle eversion    (Blank rows = not tested)   GAIT: Findings: Assistive device utilized:None, Level of assistance:  Modified independence and SBA, and Comments: B reduced foot clearance and feet scuffing, slight imbalance, and slight posterior trunk lean  FUNCTIONAL TESTS:  5 times sit to stand: 13.37 sec with B UEs  10 meter walk test: 11.87 sec (2.76 ft/sec) Patient unable to stand without UE support                                                                                                                               TREATMENT DATE: 02/22/24    PATIENT EDUCATION: Education details: prognosis, POC, edu on exam findings and relevance to pt's functional impairments, HEP Person educated: Patient Education method: Explanation, Demonstration, Tactile cues, Verbal cues, and Handouts Education comprehension: verbalized understanding and returned demonstration  HOME EXERCISE PROGRAM: Access Code: [email or web address] URL: [email or web address] Date: 02/22/2024 Prepared by: [facility] - Outpatient  Rehab - [facility]  Exercises - Standing Gastroc Stretch at Counter  - 1 x daily - 5 x weekly - 2 sets - 30 sec hold - Heel Toe Raises with Counter Support  - 1 x daily - 5 x weekly - 2 sets - 10 reps - Mini Squat with Counter Support  - 1 x daily - 5 x weekly - 2 sets - 10 reps   GOALS: Goals reviewed with patient? Yes  SHORT TERM GOALS: Target date: 03/14/2024  Patient to be independent with initial HEP. Baseline: HEP initiated Goal status: MET    LONG TERM GOALS: Target date: 04/04/2024  Patient to be independent with advanced HEP. Baseline: Not yet initiated  Goal status: IN PROGRESS  Patient to score at least 20/24 on DGI in order to decrease risk of falls.  Baseline: NT Goal status: IN PROGRESS  Patient to demonstrate gait speed of at least 3.0 ft/sec in order to improve access to community.  Baseline: 2.76 ft/sec Goal status: IN PROGRESS  Patient to demonstrate STS without UE support. Baseline: unable Goal status: IN PROGRESS  Patient to report plans to participate in  home or community exercise regimen to maintain fitness.  Baseline: not performing Goal status: IN PROGRESS  ASSESSMENT:  CLINICAL IMPRESSION:  Review to HEP with good recall and performance using printed reference materials to guide and occasional cues for sit to stand sequence wih improved carryover of forward weight shift.  Gait training w/ cane over various surfaces to improve safety with ambulation and sequence with AD with good control demonstrated and able to ambulate modified indep uneven surfaces, down hill, and stairs.  Instructed in single limb support and balance activities to facilitate righting reaction strategies and improve postural control.  Continued sesssions to advance POC details to improve mobility and reduce fall risk   OBJECTIVE IMPAIRMENTS: Abnormal gait, decreased activity tolerance, decreased balance, decreased ROM, decreased strength, and postural dysfunction.   ACTIVITY LIMITATIONS: carrying, lifting, bending, standing, squatting, stairs, transfers, bathing, dressing, and locomotion level  PARTICIPATION LIMITATIONS: meal prep, cleaning, laundry, shopping, community activity, and church  PERSONAL FACTORS: Age, Fitness, Past/current experiences, Time since onset of injury/illness/exacerbation, and 3+ comorbidities: A-fib, HLD, HTN, trigeminal neuralgia, mitral valve repair 2024, L THA are also affecting patient's functional outcome.   REHAB POTENTIAL: Good  CLINICAL DECISION MAKING: Evolving/moderate complexity  EVALUATION COMPLEXITY: Moderate  PLAN:  PT FREQUENCY: 2x/week  PT DURATION: 6 weeks  PLANNED INTERVENTIONS: [id number]- PT Re-evaluation, 97110-Therapeutic exercises, 97530- Therapeutic activity, [id number]- Neuromuscular re-education, 97535- Self Care, [id number]- Manual therapy, [id number]- Gait training, [phone]- Canalith repositioning, [id number]- Electrical stimulation (manual), [phone] (1-2 muscles), [id number] (3+ muscles)- Dry Needling, Patient/Family education, Balance  training, Stair training, Taping, Joint mobilization, Spinal mobilization, Vestibular training, Cryotherapy, and Moist heat  PLAN FOR NEXT SESSION: progress for LE strength, ankle control and strategy, STS transfers; obstacle negotiation as pt just got a new puppy   11:02 AM, 03/12/2024 M. Kelly Ishitha Roper, PT, DPT Physical Therapist-  Office Number: [phone]         "

## 2024-03-17 ENCOUNTER — Ambulatory Visit: Admitting: Physical Therapy

## 2024-03-17 ENCOUNTER — Encounter: Payer: Self-pay | Admitting: Physical Therapy

## 2024-03-17 DIAGNOSIS — R2689 Other abnormalities of gait and mobility: Secondary | ICD-10-CM

## 2024-03-17 DIAGNOSIS — R2681 Unsteadiness on feet: Secondary | ICD-10-CM | POA: Diagnosis not present

## 2024-03-17 DIAGNOSIS — M6281 Muscle weakness (generalized): Secondary | ICD-10-CM

## 2024-03-17 NOTE — Therapy (Signed)
 " OUTPATIENT PHYSICAL THERAPY NEURO TREATMENT   Patient Name: James Ibarra MRN: [id number] DOB:1951-02-16, 74 y.o., male Today's Date: 03/17/2024   PCP:  Kip Righter, MD     REFERRING PROVIDER: Onita Duos, MD  END OF SESSION:  PT End of Session - 03/17/24 1213     Visit Number 5    Number of Visits 13    Date for Recertification  04/04/24    Authorization Type [employer] [facility]    Authorization Time Period approved 13 PT visits from 02/22/24-04/11/24    Authorization - Visit Number 5    Authorization - Number of Visits 13    PT Start Time 1226    PT Stop Time 1310    PT Time Calculation (min) 44 min    Equipment Utilized During Treatment Gait belt    Activity Tolerance Patient tolerated treatment well    Behavior During Therapy WFL for tasks assessed/performed             Past Medical History:  Diagnosis Date   Atrial fibrillation (HCC)    Cancer (HCC)    Skin Cancer - Scalp, Right neck, Left Hand   Dysrhythmia    A. Fib while hospitalized June 2024   Heart murmur    History of nonmelanoma skin cancer    Hypercholesteremia    Hypertension    Pneumonia 08/2022   Trigeminal neuralgia    Past Surgical History:  Procedure Laterality Date   ATRIAL FIBRILLATION ABLATION N/A 05/24/2023   Procedure: ATRIAL FIBRILLATION ABLATION;  Surgeon: Nancey Eulas BRAVO, MD;  Location: [facility];  Service: Cardiovascular;  Laterality: N/A;   CATARACT EXTRACTION W/ INTRAOCULAR LENS IMPLANT Bilateral    CLIPPING OF ATRIAL APPENDAGE Left 12/08/2022   Procedure: CLIPPING OF ATRIAL APPENDAGE (LAA) USING 50 MM ATRICLIP;  Surgeon: Maryjane Mt, MD;  Location: [facility] OR;  Service: Open Heart Surgery;  Laterality: Left;   INGUINAL HERNIA REPAIR  11/07/2011   Procedure: LAPAROSCOPIC INGUINAL HERNIA;  Surgeon: Camellia CHRISTELLA Blush, MD,FACS;  Location: [facility];  Service: General;  Laterality: Left;   MAZE N/A 12/08/2022   Procedure: MAZE;  Surgeon: Maryjane Mt, MD;  Location: [facility] OR;  Service: Open  Heart Surgery;  Laterality: N/A;   MITRAL VALVE REPAIR N/A 12/08/2022   Procedure: MITRAL VALVE REPAIR (MVR) USING 34 MM SIMULUS SEMI- RIGID ANNULOPLASTY BAND;  Surgeon: Maryjane Mt, MD;  Location: [facility] OR;  Service: Open Heart Surgery;  Laterality: N/A;   RETINAL DETACHMENT SURGERY  03/07/1995   RIGHT/LEFT HEART CATH AND CORONARY ANGIOGRAPHY N/A 10/12/2022   Procedure: RIGHT/LEFT HEART CATH AND CORONARY ANGIOGRAPHY;  Surgeon: Wendel Lurena POUR, MD;  Location: [facility];  Service: Cardiovascular;  Laterality: N/A;   SKIN CANCER EXCISION  2012, 1999   basal cell - neck and hand   TEE WITHOUT CARDIOVERSION N/A 10/16/2022   Procedure: TRANSESOPHAGEAL ECHOCARDIOGRAM;  Surgeon: Santo Stanly LABOR, MD;  Location: [facility];  Service: Cardiovascular;  Laterality: N/A;   TEE WITHOUT CARDIOVERSION N/A 12/08/2022   Procedure: TRANSESOPHAGEAL ECHOCARDIOGRAM;  Surgeon: Maryjane Mt, MD;  Location: [facility] OR;  Service: Open Heart Surgery;  Laterality: N/A;   TOTAL HIP ARTHROPLASTY Left 09/05/2023   Procedure: ARTHROPLASTY, HIP, TOTAL, ANTERIOR APPROACH;  Surgeon: Jerri Kay CHRISTELLA, MD;  Location: [facility] OR;  Service: Orthopedics;  Laterality: Left;   Patient Active Problem List   Diagnosis Date Noted   Hypokalemia 02/04/2024   Acute encephalopathy 02/03/2024   Gait abnormality 01/14/2024   Chronic  low back pain without sciatica 01/14/2024   Malnutrition of moderate degree 09/05/2023   Subcapital fracture of neck of left femur, closed, initial encounter (HCC) 09/04/2023   Fall at home, initial encounter 09/04/2023   Left ankle injury, initial encounter 07/07/2023   Acute left ankle pain 07/07/2023   History of essential hypertension 07/07/2023   Hypercoagulable state due to paroxysmal atrial fibrillation (HCC) 06/21/2023   S/P MVR (mitral valve repair) 12/08/2022   Protein-calorie malnutrition, severe 08/19/2022   Alcohol withdrawal (HCC) 08/14/2022   Acute metabolic encephalopathy 08/14/2022    Paroxysmal atrial fibrillation (HCC) 08/14/2022   Nonrheumatic mitral valve regurgitation 08/14/2022   Mitral valve prolapse 08/14/2022   PNA (pneumonia) 08/12/2022   Severe sepsis (HCC) 08/12/2022   HLD (hyperlipidemia) 08/12/2022   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   Trigeminal neuralgia of left side of face 07/15/2019   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   Hypertension 10/31/2013   Pain in joint, shoulder region 04/14/2013   Nodule of right lung 11/07/2011    ONSET DATE: months  REFERRING DIAG: R26.9 (ICD-10-CM) - Gait abnormality M54.50,G89.29 (ICD-10-CM) - Chronic low back pain without sciatica, unspecified back pain laterality  THERAPY DIAG:  Unsteadiness on feet  Muscle weakness (generalized)  Other abnormalities of gait and mobility  Rationale for Evaluation and Treatment: Rehabilitation  SUBJECTIVE:                                                                                                                                                                                             SUBJECTIVE STATEMENT: Pretty lazy at home.  I don't have a lot of persistence in keeping up with the exercises.  Trying to work harder to stay on course with my exercises.  Just kind of wobbly with my gait.     Pt accompanied by: self  PERTINENT HISTORY: A-fib, HLD, HTN, trigeminal neuralgia, mitral valve repair 2024, L THA Hospitalized 02/03/2024-02/05/24 with AMS, found to have acute encephalopathy.   PAIN:  Are you having pain? Yes: NPRS scale: 3/10 Pain location: generalized Pain description: ache Aggravating factors: increased activity  Relieving factors: nothing  PRECAUTIONS: Fall  RED FLAGS: None   WEIGHT BEARING RESTRICTIONS: No  FALLS: Has patient fallen in last 6 months? No  LIVING ENVIRONMENT: Lives with: lives with their spouse Lives in: House/apartment Stairs: 7-8 steps to enter with rail; bedroom on 1st floor Has following  equipment at home: Single point cane, Environmental Consultant - 4 wheeled, Tour manager, and Grab bars  PLOF: Independent, Vocation/Vocational requirements: retired, and Leisure: guitar and walking but has stopped these  PATIENT GOALS: improve  balance  OBJECTIVE:    TODAY'S TREATMENT: 03/17/2024 Activity Comments  Sit to stand from mat surface, 10 reps No UE support, good form   Review SLS lift/hover/tap  At 6 step, light  UE support  Minisquats to up on toes x 10   Alternating heel raises x 10   Forward/back step and weightshift x 10 Stagger stance forward/back weightshift x 10 For ankle flexibility/strengthening, BUE support  Visible fatigue in L>R quads  Forward/back walking in parallel bars x 2 min Cues for heelstrike, foot clearance  Gait with no device, 25-50 ft  Cues for increased step length, foot clearance Several bouts  Access Code: [email or web address] URL: [email or web address] Date: 03/17/2024 Prepared by: [facility] - Outpatient  Rehab - [facility]  Exercises - Sit to Stand Without Arm Support  - 1 x daily - 5 x weekly - 2 sets - 10 reps - Heel Toe Raises with Counter Support  - 1 x daily - 5 x weekly - 2 sets - 10 reps - Mini Squat with Counter Support  - 1 x daily - 5 x weekly - 2 sets - 10 reps - Standing Gastroc Stretch at Counter  - 1 x daily - 5 x weekly - 2 sets - 30 sec hold - Standing Foot Lift on Box (BKA)  - 5 x weekly - 2-3 sets - 10 reps - Alternating Heel Raises  - 1 x daily - 5 x weekly - 2 sets - 10 reps - Staggered Stance Forward Backward Weight Shift with Counter Support  - 1 x daily - 5 x weekly - 2 sets - 10 reps  PATIENT EDUCATION: Education details: Updates to HEP Person educated: Patient Education method: Programmer, Multimedia, Demonstration, Verbal cues, and Handouts Education comprehension: verbalized understanding, returned demonstration, and needs further education         Note: Objective measures were completed at Evaluation unless otherwise  noted.  DIAGNOSTIC FINDINGS: 01/29/24  MRI scan of the lumbar spine showing mild disc and prominent facet degenerative changes most prominent at L4-5 where there is mild right-sided foraminal narrowing.  02/04/24 brain MRI: Interval resolution of restricted diffusion within the left hippocampus. 2. Moderate diffuse cerebral volume loss. 3. 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  COGNITION: Overall cognitive status: Within functional limits for tasks assessed   SENSATION: Intact in B LEs to light touch/pressure  COORDINATION: Alternating pronation/supination: WNL B Alternating toe tap: WNL B Finger to nose: slight dysmetria and intention tremor B   MUSCLE TONE: WNL B LEs  POSTURE: rounded shoulders and forward head  LOWER EXTREMITY ROM:     Active  Right Eval Left Eval  Hip flexion    Hip extension    Hip abduction    Hip adduction    Hip internal rotation    Hip external rotation    Knee flexion    Knee extension    Ankle dorsiflexion 6 4  Ankle plantarflexion    Ankle inversion    Ankle eversion     (Blank rows = not tested)  LOWER EXTREMITY MMT:    MMT (in sitting) Right Eval Left Eval  Hip flexion 3+ 4  Hip extension    Hip abduction 4+ 4  Hip adduction 4 4  Hip internal rotation    Hip external rotation    Knee flexion 4+ 4  Knee extension 4+ 4-  Ankle dorsiflexion 3+ 3+  Ankle plantarflexion 2+ Unable to complete 1 full rep in SLS 2+ Unable to complete  1 full rep in SLS  Ankle inversion    Ankle eversion    (Blank rows = not tested)   GAIT: Findings: Assistive device utilized:None, Level of assistance: Modified independence and SBA, and Comments: B reduced foot clearance and feet scuffing, slight imbalance, and slight posterior trunk lean  FUNCTIONAL TESTS:  5 times sit to stand: 13.37 sec with B UEs  10 meter walk test: 11.87 sec (2.76 ft/sec) Patient unable to stand without UE support                                                                                                                                TREATMENT DATE: 02/22/24    PATIENT EDUCATION: Education details: prognosis, POC, edu on exam findings and relevance to pt's functional impairments, HEP Person educated: Patient Education method: Explanation, Demonstration, Tactile cues, Verbal cues, and Handouts Education comprehension: verbalized understanding and returned demonstration  HOME EXERCISE PROGRAM: Access Code: [email or web address] URL: [email or web address] Date: 02/22/2024 Prepared by: [facility] - Outpatient  Rehab - [facility]  Exercises - Standing Gastroc Stretch at Counter  - 1 x daily - 5 x weekly - 2 sets - 30 sec hold - Heel Toe Raises with Counter Support  - 1 x daily - 5 x weekly - 2 sets - 10 reps - Mini Squat with Counter Support  - 1 x daily - 5 x weekly - 2 sets - 10 reps   GOALS: Goals reviewed with patient? Yes  SHORT TERM GOALS: Target date: 03/14/2024  Patient to be independent with initial HEP. Baseline: HEP initiated Goal status: MET    LONG TERM GOALS: Target date: 04/04/2024  Patient to be independent with advanced HEP. Baseline: Not yet initiated  Goal status: IN PROGRESS  Patient to score at least 20/24 on DGI in order to decrease risk of falls.  Baseline: NT Goal status: IN PROGRESS  Patient to demonstrate gait speed of at least 3.0 ft/sec in order to improve access to community.  Baseline: 2.76 ft/sec Goal status: IN PROGRESS  Patient to demonstrate STS without UE support. Baseline: unable Goal status: IN PROGRESS  Patient to report plans to participate in home or community exercise regimen to maintain fitness.  Baseline: not performing Goal status: IN PROGRESS  ASSESSMENT:  CLINICAL IMPRESSION: Pt presents today with reports of unsteadiness and wanting to work on balance/leg strength. Skilled PT session focused on review and progression of exercises to work on  control and instrumentation engineer and ant tib.  Also worked on short distance gait in parallel bars and in clinic (no device-pt did not bring in cane today), focusing on increased step length, heelstrike/foot clearance and arm swing.  Pt notes he feels more steady this way.  He will continue to benefit from skilled PT towards goals for improved functional mobility and decreased fall risk.   OBJECTIVE IMPAIRMENTS: Abnormal gait, decreased activity tolerance, decreased balance, decreased ROM, decreased strength, and postural dysfunction.   ACTIVITY LIMITATIONS:  carrying, lifting, bending, standing, squatting, stairs, transfers, bathing, dressing, and locomotion level  PARTICIPATION LIMITATIONS: meal prep, cleaning, laundry, shopping, community activity, and church  PERSONAL FACTORS: Age, Fitness, Past/current experiences, Time since onset of injury/illness/exacerbation, and 3+ comorbidities: A-fib, HLD, HTN, trigeminal neuralgia, mitral valve repair 2024, L THA are also affecting patient's functional outcome.   REHAB POTENTIAL: Good  CLINICAL DECISION MAKING: Evolving/moderate complexity  EVALUATION COMPLEXITY: Moderate  PLAN:  PT FREQUENCY: 2x/week  PT DURATION: 6 weeks  PLANNED INTERVENTIONS: [id number]- PT Re-evaluation, 97110-Therapeutic exercises, 97530- Therapeutic activity, [id number]- Neuromuscular re-education, 97535- Self Care, [id number]- Manual therapy, [id number]- Gait training, [phone]- Canalith repositioning, [id number]- Electrical stimulation (manual), [phone] (1-2 muscles), [id number] (3+ muscles)- Dry Needling, Patient/Family education, Balance training, Stair training, Taping, Joint mobilization, Spinal mobilization, Vestibular training, Cryotherapy, and Moist heat  PLAN FOR NEXT SESSION: Continue progress for BLE strength, ankle control and strategy, STS transfers; obstacle negotiation as pt just got a new puppy. Gait with focus on step length, heelstrike, arm swing   Greig Anon, PT 03/17/2024 1:13 PM Phone: [phone] Fax:  [phone]  [facility] at [facility] [address], [address] [place], [place] [address] Phone # [phone] Fax # [phone]         "

## 2024-03-19 ENCOUNTER — Ambulatory Visit: Admitting: Physical Therapy

## 2024-03-21 NOTE — Therapy (Signed)
 " OUTPATIENT PHYSICAL THERAPY NEURO TREATMENT   Patient Name: James Ibarra MRN: [id number] DOB:[birth year], 74 y.o., male Today's Date: 03/24/2024   PCP:  Kip Righter, MD     REFERRING PROVIDER: Onita Duos, MD  END OF SESSION:  PT End of Session - 03/24/24 1225     Visit Number 6    Number of Visits 13    Date for Recertification  04/04/24    Authorization Type [employer] [facility]    Authorization Time Period approved 13 PT visits from 02/22/24-04/11/24    Authorization - Visit Number 6    Authorization - Number of Visits 13    PT Start Time 1152    PT Stop Time 1231    PT Time Calculation (min) 39 min    Equipment Utilized During Treatment Gait belt    Activity Tolerance Patient tolerated treatment well    Behavior During Therapy WFL for tasks assessed/performed              Past Medical History:  Diagnosis Date   Atrial fibrillation (HCC)    Cancer (HCC)    Skin Cancer - Scalp, Right neck, Left Hand   Dysrhythmia    A. Fib while hospitalized June 2024   Heart murmur    History of nonmelanoma skin cancer    Hypercholesteremia    Hypertension    Pneumonia 08/2022   Trigeminal neuralgia    Past Surgical History:  Procedure Laterality Date   ATRIAL FIBRILLATION ABLATION N/A 05/24/2023   Procedure: ATRIAL FIBRILLATION ABLATION;  Surgeon: Nancey Eulas BRAVO, MD;  Location: [facility];  Service: Cardiovascular;  Laterality: N/A;   CATARACT EXTRACTION W/ INTRAOCULAR LENS IMPLANT Bilateral    CLIPPING OF ATRIAL APPENDAGE Left 12/08/2022   Procedure: CLIPPING OF ATRIAL APPENDAGE (LAA) USING 50 MM ATRICLIP;  Surgeon: Maryjane Mt, MD;  Location: [facility] OR;  Service: Open Heart Surgery;  Laterality: Left;   INGUINAL HERNIA REPAIR  11/07/2011   Procedure: LAPAROSCOPIC INGUINAL HERNIA;  Surgeon: Camellia CHRISTELLA Blush, MD,FACS;  Location: [facility];  Service: General;  Laterality: Left;   MAZE N/A 12/08/2022   Procedure: MAZE;  Surgeon: Maryjane Mt, MD;  Location: [facility] OR;  Service:  Open Heart Surgery;  Laterality: N/A;   MITRAL VALVE REPAIR N/A 12/08/2022   Procedure: MITRAL VALVE REPAIR (MVR) USING 34 MM SIMULUS SEMI- RIGID ANNULOPLASTY BAND;  Surgeon: Maryjane Mt, MD;  Location: [facility] OR;  Service: Open Heart Surgery;  Laterality: N/A;   RETINAL DETACHMENT SURGERY  03/07/1995   RIGHT/LEFT HEART CATH AND CORONARY ANGIOGRAPHY N/A 10/12/2022   Procedure: RIGHT/LEFT HEART CATH AND CORONARY ANGIOGRAPHY;  Surgeon: Wendel Lurena POUR, MD;  Location: [facility];  Service: Cardiovascular;  Laterality: N/A;   SKIN CANCER EXCISION  2012, 1999   basal cell - neck and hand   TEE WITHOUT CARDIOVERSION N/A 10/16/2022   Procedure: TRANSESOPHAGEAL ECHOCARDIOGRAM;  Surgeon: Santo Stanly LABOR, MD;  Location: [facility];  Service: Cardiovascular;  Laterality: N/A;   TEE WITHOUT CARDIOVERSION N/A 12/08/2022   Procedure: TRANSESOPHAGEAL ECHOCARDIOGRAM;  Surgeon: Maryjane Mt, MD;  Location: [facility] OR;  Service: Open Heart Surgery;  Laterality: N/A;   TOTAL HIP ARTHROPLASTY Left 09/05/2023   Procedure: ARTHROPLASTY, HIP, TOTAL, ANTERIOR APPROACH;  Surgeon: Jerri Kay CHRISTELLA, MD;  Location: [facility] OR;  Service: Orthopedics;  Laterality: Left;   Patient Active Problem List   Diagnosis Date Noted   Hypokalemia 02/04/2024   Acute encephalopathy 02/03/2024   Gait abnormality 01/14/2024  Chronic low back pain without sciatica 01/14/2024   Malnutrition of moderate degree 09/05/2023   Subcapital fracture of neck of left femur, closed, initial encounter (HCC) 09/04/2023   Fall at home, initial encounter 09/04/2023   Left ankle injury, initial encounter 07/07/2023   Acute left ankle pain 07/07/2023   History of essential hypertension 07/07/2023   Hypercoagulable state due to paroxysmal atrial fibrillation (HCC) 06/21/2023   S/P MVR (mitral valve repair) 12/08/2022   Protein-calorie malnutrition, severe 08/19/2022   Alcohol withdrawal (HCC) 08/14/2022   Acute metabolic encephalopathy  08/14/2022   Paroxysmal atrial fibrillation (HCC) 08/14/2022   Nonrheumatic mitral valve regurgitation 08/14/2022   Mitral valve prolapse 08/14/2022   PNA (pneumonia) 08/12/2022   Severe sepsis (HCC) 08/12/2022   HLD (hyperlipidemia) 08/12/2022   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   Trigeminal neuralgia of left side of face 07/15/2019   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   Hypertension 10/31/2013   Pain in joint, shoulder region 04/14/2013   Nodule of right lung 11/07/2011    ONSET DATE: months  REFERRING DIAG: R26.9 (ICD-10-CM) - Gait abnormality M54.50,G89.29 (ICD-10-CM) - Chronic low back pain without sciatica, unspecified back pain laterality  THERAPY DIAG:  Unsteadiness on feet  Muscle weakness (generalized)  Other abnormalities of gait and mobility  Abnormal posture  Rationale for Evaluation and Treatment: Rehabilitation  SUBJECTIVE:                                                                                                                                                                                             SUBJECTIVE STATEMENT: Forgot my cane- I don't use it at all. Could not find a tip for the bottom of his cane.    Pt accompanied by: self  PERTINENT HISTORY: A-fib, HLD, HTN, trigeminal neuralgia, mitral valve repair 2024, L THA Hospitalized 02/03/2024-02/05/24 with AMS, found to have acute encephalopathy.   PAIN:  Are you having pain? Yes: NPRS scale: 0/10 Pain location: generalized Pain description: ache Aggravating factors: increased activity  Relieving factors: nothing  PRECAUTIONS: Fall  RED FLAGS: None   WEIGHT BEARING RESTRICTIONS: No  FALLS: Has patient fallen in last 6 months? No  LIVING ENVIRONMENT: Lives with: lives with their spouse Lives in: House/apartment Stairs: 7-8 steps to enter with rail; bedroom on 1st floor Has following equipment at home: Single point cane, Environmental Consultant - 4 wheeled, Tour manager,  and Grab bars  PLOF: Independent, Vocation/Vocational requirements: retired, and Leisure: guitar and walking but has stopped these  PATIENT GOALS: improve balance  OBJECTIVE:     TODAY'S TREATMENT: 03/24/24 Activity Comments  STS with  green medball 2x10 Good technique and effort for eccentric control. Difficulty completing last few reps on 2nd set d/t fatigue   4 square step with cane Required heavy cueing for safe foot and cane placement and CGA-min A. Backwards steps required B UE support on chair   toe tap to 3 cones  Weaned to 2 fingertip support  Toe taps to 1 cone Frequent min A required to recover imbalance and cues to contract core to avoid trunk instability  Alt fwd stepping  1 UE support and CGA-min A     PATIENT EDUCATION: Education details: encouraged use of cane  Person educated: Patient Education method: Explanation Education comprehension: verbalized understanding   Access Code: [email or web address] URL: [email or web address] Date: 03/17/2024 Prepared by: [facility] - Outpatient  Rehab - [facility]  Exercises - Sit to Stand Without Arm Support  - 1 x daily - 5 x weekly - 2 sets - 10 reps - Heel Toe Raises with Counter Support  - 1 x daily - 5 x weekly - 2 sets - 10 reps - Mini Squat with Counter Support  - 1 x daily - 5 x weekly - 2 sets - 10 reps - Standing Gastroc Stretch at Counter  - 1 x daily - 5 x weekly - 2 sets - 30 sec hold - Standing Foot Lift on Box (BKA)  - 5 x weekly - 2-3 sets - 10 reps - Alternating Heel Raises  - 1 x daily - 5 x weekly - 2 sets - 10 reps - Staggered Stance Forward Backward Weight Shift with Counter Support  - 1 x daily - 5 x weekly - 2 sets - 10 reps    Note: Objective measures were completed at Evaluation unless otherwise noted.  DIAGNOSTIC FINDINGS: 01/29/24  MRI scan of the lumbar spine showing mild disc and prominent facet degenerative changes most prominent at L4-5 where there is mild right-sided foraminal  narrowing.  02/04/24 brain MRI: Interval resolution of restricted diffusion within the left hippocampus. 2. Moderate diffuse cerebral volume loss. 3. 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  COGNITION: Overall cognitive status: Within functional limits for tasks assessed   SENSATION: Intact in B LEs to light touch/pressure  COORDINATION: Alternating pronation/supination: WNL B Alternating toe tap: WNL B Finger to nose: slight dysmetria and intention tremor B   MUSCLE TONE: WNL B LEs  POSTURE: rounded shoulders and forward head  LOWER EXTREMITY ROM:     Active  Right Eval Left Eval  Hip flexion    Hip extension    Hip abduction    Hip adduction    Hip internal rotation    Hip external rotation    Knee flexion    Knee extension    Ankle dorsiflexion 6 4  Ankle plantarflexion    Ankle inversion    Ankle eversion     (Blank rows = not tested)  LOWER EXTREMITY MMT:    MMT (in sitting) Right Eval Left Eval  Hip flexion 3+ 4  Hip extension    Hip abduction 4+ 4  Hip adduction 4 4  Hip internal rotation    Hip external rotation    Knee flexion 4+ 4  Knee extension 4+ 4-  Ankle dorsiflexion 3+ 3+  Ankle plantarflexion 2+ Unable to complete 1 full rep in SLS 2+ Unable to complete 1 full rep in SLS  Ankle inversion    Ankle eversion    (Blank rows = not tested)   GAIT: Findings: Assistive  device utilized:None, Level of assistance: Modified independence and SBA, and Comments: B reduced foot clearance and feet scuffing, slight imbalance, and slight posterior trunk lean  FUNCTIONAL TESTS:  5 times sit to stand: 13.37 sec with B UEs  10 meter walk test: 11.87 sec (2.76 ft/sec) Patient unable to stand without UE support                                                                                                                               TREATMENT DATE: 02/22/24    PATIENT EDUCATION: Education details: prognosis, POC,  edu on exam findings and relevance to pt's functional impairments, HEP Person educated: Patient Education method: Explanation, Demonstration, Tactile cues, Verbal cues, and Handouts Education comprehension: verbalized understanding and returned demonstration  HOME EXERCISE PROGRAM: Access Code: [email or web address] URL: [email or web address] Date: 02/22/2024 Prepared by: [facility] - Outpatient  Rehab - [facility]  Exercises - Standing Gastroc Stretch at Counter  - 1 x daily - 5 x weekly - 2 sets - 30 sec hold - Heel Toe Raises with Counter Support  - 1 x daily - 5 x weekly - 2 sets - 10 reps - Mini Squat with Counter Support  - 1 x daily - 5 x weekly - 2 sets - 10 reps   GOALS: Goals reviewed with patient? Yes  SHORT TERM GOALS: Target date: 03/14/2024  Patient to be independent with initial HEP. Baseline: HEP initiated Goal status: MET    LONG TERM GOALS: Target date: 04/04/2024  Patient to be independent with advanced HEP. Baseline: Not yet initiated  Goal status: IN PROGRESS  Patient to score at least 20/24 on DGI in order to decrease risk of falls.  Baseline: NT Goal status: IN PROGRESS  Patient to demonstrate gait speed of at least 3.0 ft/sec in order to improve access to community.  Baseline: 2.76 ft/sec Goal status: IN PROGRESS  Patient to demonstrate STS without UE support. Baseline: unable Goal status: IN PROGRESS  Patient to report plans to participate in home or community exercise regimen to maintain fitness.  Baseline: not performing Goal status: IN PROGRESS  ASSESSMENT:  CLINICAL IMPRESSION: Patient arrived to session without complaints, however admits to noncompliance with AD and requires continued education on rationale for AD. Session focused on review of LE strengthening with STS- patient demonstrated great carryover of technique. Balance training required verbal cueing for safety and cane use vs. UE support for most tasks d/t instability.  Patient tolerated session well and without complaints at end of appointment.  OBJECTIVE IMPAIRMENTS: Abnormal gait, decreased activity tolerance, decreased balance, decreased ROM, decreased strength, and postural dysfunction.   ACTIVITY LIMITATIONS: carrying, lifting, bending, standing, squatting, stairs, transfers, bathing, dressing, and locomotion level  PARTICIPATION LIMITATIONS: meal prep, cleaning, laundry, shopping, community activity, and church  PERSONAL FACTORS: Age, Fitness, Past/current experiences, Time since onset of injury/illness/exacerbation, and 3+ comorbidities: A-fib, HLD, HTN, trigeminal neuralgia, mitral valve repair 2024, L THA are also affecting  patient's functional outcome.   REHAB POTENTIAL: Good  CLINICAL DECISION MAKING: Evolving/moderate complexity  EVALUATION COMPLEXITY: Moderate  PLAN:  PT FREQUENCY: 2x/week  PT DURATION: 6 weeks  PLANNED INTERVENTIONS: [id number]- PT Re-evaluation, 97110-Therapeutic exercises, 97530- Therapeutic activity, [id number]- Neuromuscular re-education, 97535- Self Care, [id number]- Manual therapy, [id number]- Gait training, [phone]- Canalith repositioning, [id number]- Electrical stimulation (manual), [phone] (1-2 muscles), [id number] (3+ muscles)- Dry Needling, Patient/Family education, Balance training, Stair training, Taping, Joint mobilization, Spinal mobilization, Vestibular training, Cryotherapy, and Moist heat  PLAN FOR NEXT SESSION: Continue progress for BLE strength, ankle control and strategy, STS transfers; obstacle negotiation as pt just got a new puppy. Gait with focus on step length, heelstrike, arm swing   Louana Terrilyn Christians, [place], DPT 03/24/24 12:37 PM  [facility] at [facility] [address] [place], [address] [place], [place] [address] Phone # [phone] Fax # [phone]    "

## 2024-03-24 ENCOUNTER — Ambulatory Visit: Admitting: Physical Therapy

## 2024-03-24 ENCOUNTER — Encounter: Payer: Self-pay | Admitting: Physical Therapy

## 2024-03-24 DIAGNOSIS — R2681 Unsteadiness on feet: Secondary | ICD-10-CM | POA: Diagnosis not present

## 2024-03-24 DIAGNOSIS — R293 Abnormal posture: Secondary | ICD-10-CM

## 2024-03-24 DIAGNOSIS — M6281 Muscle weakness (generalized): Secondary | ICD-10-CM

## 2024-03-24 DIAGNOSIS — R2689 Other abnormalities of gait and mobility: Secondary | ICD-10-CM

## 2024-03-26 ENCOUNTER — Ambulatory Visit

## 2024-03-26 DIAGNOSIS — R2689 Other abnormalities of gait and mobility: Secondary | ICD-10-CM

## 2024-03-26 DIAGNOSIS — M6281 Muscle weakness (generalized): Secondary | ICD-10-CM

## 2024-03-26 DIAGNOSIS — R2681 Unsteadiness on feet: Secondary | ICD-10-CM

## 2024-03-26 NOTE — Therapy (Signed)
 " OUTPATIENT PHYSICAL THERAPY NEURO TREATMENT   Patient Name: James Ibarra MRN: [id number] DOB:June 19, 1950, 74 y.o., male Today's Date: 03/26/2024   PCP:  Kip Righter, MD     REFERRING PROVIDER: Onita Duos, MD  END OF SESSION:  PT End of Session - 03/26/24 1148     Visit Number 7    Number of Visits 13    Date for Recertification  04/04/24    Authorization Type [employer] [facility]    Authorization Time Period approved 13 PT visits from 02/22/24-04/11/24    Authorization - Visit Number 7    Authorization - Number of Visits 13    PT Start Time 1148    PT Stop Time 1230    PT Time Calculation (min) 42 min    Equipment Utilized During Treatment Gait belt    Activity Tolerance Patient tolerated treatment well    Behavior During Therapy WFL for tasks assessed/performed              Past Medical History:  Diagnosis Date   Atrial fibrillation (HCC)    Cancer (HCC)    Skin Cancer - Scalp, Right neck, Left Hand   Dysrhythmia    A. Fib while hospitalized June 2024   Heart murmur    History of nonmelanoma skin cancer    Hypercholesteremia    Hypertension    Pneumonia 08/2022   Trigeminal neuralgia    Past Surgical History:  Procedure Laterality Date   ATRIAL FIBRILLATION ABLATION N/A 05/24/2023   Procedure: ATRIAL FIBRILLATION ABLATION;  Surgeon: Nancey Eulas BRAVO, MD;  Location: [facility];  Service: Cardiovascular;  Laterality: N/A;   CATARACT EXTRACTION W/ INTRAOCULAR LENS IMPLANT Bilateral    CLIPPING OF ATRIAL APPENDAGE Left 12/08/2022   Procedure: CLIPPING OF ATRIAL APPENDAGE (LAA) USING 50 MM ATRICLIP;  Surgeon: Maryjane Mt, MD;  Location: [facility] OR;  Service: Open Heart Surgery;  Laterality: Left;   INGUINAL HERNIA REPAIR  11/07/2011   Procedure: LAPAROSCOPIC INGUINAL HERNIA;  Surgeon: Camellia CHRISTELLA Blush, MD,FACS;  Location: [facility] ORS;  Service: General;  Laterality: Left;   MAZE N/A 12/08/2022   Procedure: MAZE;  Surgeon: Maryjane Mt, MD;  Location: [facility] OR;  Service:  Open Heart Surgery;  Laterality: N/A;   MITRAL VALVE REPAIR N/A 12/08/2022   Procedure: MITRAL VALVE REPAIR (MVR) USING 34 MM SIMULUS SEMI- RIGID ANNULOPLASTY BAND;  Surgeon: Maryjane Mt, MD;  Location: [facility] OR;  Service: Open Heart Surgery;  Laterality: N/A;   RETINAL DETACHMENT SURGERY  03/07/1995   RIGHT/LEFT HEART CATH AND CORONARY ANGIOGRAPHY N/A 10/12/2022   Procedure: RIGHT/LEFT HEART CATH AND CORONARY ANGIOGRAPHY;  Surgeon: Wendel Lurena POUR, MD;  Location: [facility];  Service: Cardiovascular;  Laterality: N/A;   SKIN CANCER EXCISION  2012, 1999   basal cell - neck and hand   TEE WITHOUT CARDIOVERSION N/A 10/16/2022   Procedure: TRANSESOPHAGEAL ECHOCARDIOGRAM;  Surgeon: Santo Stanly LABOR, MD;  Location: [facility];  Service: Cardiovascular;  Laterality: N/A;   TEE WITHOUT CARDIOVERSION N/A 12/08/2022   Procedure: TRANSESOPHAGEAL ECHOCARDIOGRAM;  Surgeon: Maryjane Mt, MD;  Location: [facility] OR;  Service: Open Heart Surgery;  Laterality: N/A;   TOTAL HIP ARTHROPLASTY Left 09/05/2023   Procedure: ARTHROPLASTY, HIP, TOTAL, ANTERIOR APPROACH;  Surgeon: Jerri Kay CHRISTELLA, MD;  Location: [facility] OR;  Service: Orthopedics;  Laterality: Left;   Patient Active Problem List   Diagnosis Date Noted   Hypokalemia 02/04/2024   Acute encephalopathy 02/03/2024   Gait abnormality 01/14/2024  Chronic low back pain without sciatica 01/14/2024   Malnutrition of moderate degree 09/05/2023   Subcapital fracture of neck of left femur, closed, initial encounter (HCC) 09/04/2023   Fall at home, initial encounter 09/04/2023   Left ankle injury, initial encounter 07/07/2023   Acute left ankle pain 07/07/2023   History of essential hypertension 07/07/2023   Hypercoagulable state due to paroxysmal atrial fibrillation (HCC) 06/21/2023   S/P MVR (mitral valve repair) 12/08/2022   Protein-calorie malnutrition, severe 08/19/2022   Alcohol withdrawal (HCC) 08/14/2022   Acute metabolic encephalopathy  08/14/2022   Paroxysmal atrial fibrillation (HCC) 08/14/2022   Nonrheumatic mitral valve regurgitation 08/14/2022   Mitral valve prolapse 08/14/2022   PNA (pneumonia) 08/12/2022   Severe sepsis (HCC) 08/12/2022   HLD (hyperlipidemia) 08/12/2022   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   Trigeminal neuralgia of left side of face 07/15/2019   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   Hypertension 10/31/2013   Pain in joint, shoulder region 04/14/2013   Nodule of right lung 11/07/2011    ONSET DATE: months  REFERRING DIAG: R26.9 (ICD-10-CM) - Gait abnormality M54.50,G89.29 (ICD-10-CM) - Chronic low back pain without sciatica, unspecified back pain laterality  THERAPY DIAG:  Unsteadiness on feet  Muscle weakness (generalized)  Other abnormalities of gait and mobility  Rationale for Evaluation and Treatment: Rehabilitation  SUBJECTIVE:                                                                                                                                                                                             SUBJECTIVE STATEMENT: Not up to too much   Pt accompanied by: self  PERTINENT HISTORY: A-fib, HLD, HTN, trigeminal neuralgia, mitral valve repair 2024, L THA Hospitalized 02/03/2024-02/05/24 with AMS, found to have acute encephalopathy.   PAIN:  Are you having pain? Yes: NPRS scale: 0/10 Pain location: generalized Pain description: ache Aggravating factors: increased activity  Relieving factors: nothing  PRECAUTIONS: Fall  RED FLAGS: None   WEIGHT BEARING RESTRICTIONS: No  FALLS: Has patient fallen in last 6 months? No  LIVING ENVIRONMENT: Lives with: lives with their spouse Lives in: House/apartment Stairs: 7-8 steps to enter with rail; bedroom on 1st floor Has following equipment at home: Single point cane, Environmental Consultant - 4 wheeled, Tour manager, and Grab bars  PLOF: Independent, Vocation/Vocational requirements: retired, and Leisure:  guitar and walking but has stopped these  PATIENT GOALS: improve balance  OBJECTIVE:    TODAY'S TREATMENT: 03/26/24 Activity Comments  STS w/ alt stair tap 2x10 Green ball, 6 step;  difficulty with SLS control  Standing balance activities -Standing on compliant  surface reaching laterally for weighted balls and throwing to target for postural perturbation/anticipatory reactions -step and slide cones for SLS -lateral/diagonal steps over obstacles  Gait training Supervision w/ trekking poles level and outdoor surfaces with cues in technique and increasing stride length              TODAY'S TREATMENT: 03/24/24 Activity Comments  STS with green medball 2x10 Good technique and effort for eccentric control. Difficulty completing last few reps on 2nd set d/t fatigue   4 square step with cane Required heavy cueing for safe foot and cane placement and CGA-min A. Backwards steps required B UE support on chair   toe tap to 3 cones  Weaned to 2 fingertip support  Toe taps to 1 cone Frequent min A required to recover imbalance and cues to contract core to avoid trunk instability  Alt fwd stepping  1 UE support and CGA-min A     PATIENT EDUCATION: Education details: encouraged use of cane  Person educated: Patient Education method: Explanation Education comprehension: verbalized understanding   Access Code: [email or web address] URL: [email or web address] Date: 03/17/2024 Prepared by: [facility] - Outpatient  Rehab - [facility]  Exercises - Sit to Stand Without Arm Support  - 1 x daily - 5 x weekly - 2 sets - 10 reps - Heel Toe Raises with Counter Support  - 1 x daily - 5 x weekly - 2 sets - 10 reps - Mini Squat with Counter Support  - 1 x daily - 5 x weekly - 2 sets - 10 reps - Standing Gastroc Stretch at Counter  - 1 x daily - 5 x weekly - 2 sets - 30 sec hold - Standing Foot Lift on Box (BKA)  - 5 x weekly - 2-3 sets - 10 reps - Alternating Heel Raises  - 1 x daily - 5 x  weekly - 2 sets - 10 reps - Staggered Stance Forward Backward Weight Shift with Counter Support  - 1 x daily - 5 x weekly - 2 sets - 10 reps    Note: Objective measures were completed at Evaluation unless otherwise noted.  DIAGNOSTIC FINDINGS: 01/29/24  MRI scan of the lumbar spine showing mild disc and prominent facet degenerative changes most prominent at L4-5 where there is mild right-sided foraminal narrowing.  02/04/24 brain MRI: Interval resolution of restricted diffusion within the left hippocampus. 2. Moderate diffuse cerebral volume loss. 3. 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  COGNITION: Overall cognitive status: Within functional limits for tasks assessed   SENSATION: Intact in B LEs to light touch/pressure  COORDINATION: Alternating pronation/supination: WNL B Alternating toe tap: WNL B Finger to nose: slight dysmetria and intention tremor B   MUSCLE TONE: WNL B LEs  POSTURE: rounded shoulders and forward head  LOWER EXTREMITY ROM:     Active  Right Eval Left Eval  Hip flexion    Hip extension    Hip abduction    Hip adduction    Hip internal rotation    Hip external rotation    Knee flexion    Knee extension    Ankle dorsiflexion 6 4  Ankle plantarflexion    Ankle inversion    Ankle eversion     (Blank rows = not tested)  LOWER EXTREMITY MMT:    MMT (in sitting) Right Eval Left Eval  Hip flexion 3+ 4  Hip extension    Hip abduction 4+ 4  Hip adduction 4 4  Hip internal rotation  Hip external rotation    Knee flexion 4+ 4  Knee extension 4+ 4-  Ankle dorsiflexion 3+ 3+  Ankle plantarflexion 2+ Unable to complete 1 full rep in SLS 2+ Unable to complete 1 full rep in SLS  Ankle inversion    Ankle eversion    (Blank rows = not tested)   GAIT: Findings: Assistive device utilized:None, Level of assistance: Modified independence and SBA, and Comments: B reduced foot clearance and feet scuffing, slight  imbalance, and slight posterior trunk lean  FUNCTIONAL TESTS:  5 times sit to stand: 13.37 sec with B UEs  10 meter walk test: 11.87 sec (2.76 ft/sec) Patient unable to stand without UE support                                                                                                                               TREATMENT DATE: 02/22/24    PATIENT EDUCATION: Education details: prognosis, POC, edu on exam findings and relevance to pt's functional impairments, HEP Person educated: Patient Education method: Explanation, Demonstration, Tactile cues, Verbal cues, and Handouts Education comprehension: verbalized understanding and returned demonstration  HOME EXERCISE PROGRAM: Access Code: [email or web address] URL: [email or web address] Date: 02/22/2024 Prepared by: [facility] - Outpatient  Rehab - [facility]  Exercises - Standing Gastroc Stretch at Counter  - 1 x daily - 5 x weekly - 2 sets - 30 sec hold - Heel Toe Raises with Counter Support  - 1 x daily - 5 x weekly - 2 sets - 10 reps - Mini Squat with Counter Support  - 1 x daily - 5 x weekly - 2 sets - 10 reps   GOALS: Goals reviewed with patient? Yes  SHORT TERM GOALS: Target date: 03/14/2024  Patient to be independent with initial HEP. Baseline: HEP initiated Goal status: MET    LONG TERM GOALS: Target date: 04/04/2024  Patient to be independent with advanced HEP. Baseline: Not yet initiated  Goal status: IN PROGRESS  Patient to score at least 20/24 on DGI in order to decrease risk of falls.  Baseline: 14/24 Goal status: IN PROGRESS  Patient to demonstrate gait speed of at least 3.0 ft/sec in order to improve access to community.  Baseline: 2.76 ft/sec Goal status: IN PROGRESS  Patient to demonstrate STS without UE support. Baseline: unable Goal status: IN PROGRESS  Patient to report plans to participate in home or community exercise regimen to maintain fitness.  Baseline: not performing Goal  status: IN PROGRESS  ASSESSMENT:  CLINICAL IMPRESSION: Activities to promote single limb stance control to improve balance, mobilty and safety with obstacles with considerable diffculty under single limb stance demands requiring UE support 75% of the time with unsteadiness throughout.  Righting/anticipatory responses present but delayed requiring CGA-min A for LOB correction.  Instructed in use of trekking poles to improve stride length and stability with ambulation on uneven surfaces w/ good carryover and markedly improved foot clearance w/ use of device vs  without.  Continued sessions to progress POC details to improve mobility and reduce risk for falls  OBJECTIVE IMPAIRMENTS: Abnormal gait, decreased activity tolerance, decreased balance, decreased ROM, decreased strength, and postural dysfunction.   ACTIVITY LIMITATIONS: carrying, lifting, bending, standing, squatting, stairs, transfers, bathing, dressing, and locomotion level  PARTICIPATION LIMITATIONS: meal prep, cleaning, laundry, shopping, community activity, and church  PERSONAL FACTORS: Age, Fitness, Past/current experiences, Time since onset of injury/illness/exacerbation, and 3+ comorbidities: A-fib, HLD, HTN, trigeminal neuralgia, mitral valve repair 2024, L THA are also affecting patient's functional outcome.   REHAB POTENTIAL: Good  CLINICAL DECISION MAKING: Evolving/moderate complexity  EVALUATION COMPLEXITY: Moderate  PLAN:  PT FREQUENCY: 2x/week  PT DURATION: 6 weeks  PLANNED INTERVENTIONS: [id number]- PT Re-evaluation, 97110-Therapeutic exercises, 97530- Therapeutic activity, [id number]- Neuromuscular re-education, 97535- Self Care, [id number]- Manual therapy, [id number]- Gait training, [phone]- Canalith repositioning, [id number]- Electrical stimulation (manual), [phone] (1-2 muscles), [id number] (3+ muscles)- Dry Needling, Patient/Family education, Balance training, Stair training, Taping, Joint mobilization, Spinal mobilization, Vestibular training,  Cryotherapy, and Moist heat  PLAN FOR NEXT SESSION: Continue progress for BLE strength, ankle control and strategy, STS transfers; obstacle negotiation as pt just got a new puppy. Gait with focus on step length, heelstrike, arm swing  12:53 PM, 03/26/24 M. Kelly Kenyada Dosch, PT, DPT Physical Therapist- [place] Office Number: [phone]     "

## 2024-03-28 NOTE — Therapy (Incomplete)
 " OUTPATIENT PHYSICAL THERAPY NEURO TREATMENT   Patient Name: James Ibarra MRN: [id number] DOB:Nov 13, 1950, 74 y.o., male Today's Date: 03/28/2024   PCP:  Kip Righter, MD     REFERRING PROVIDER: Onita Duos, MD  END OF SESSION:        Past Medical History:  Diagnosis Date   Atrial fibrillation (HCC)    Cancer [facility])    Skin Cancer - Scalp, Right neck, Left Hand   Dysrhythmia    A. Fib while hospitalized June 2024   Heart murmur    History of nonmelanoma skin cancer    Hypercholesteremia    Hypertension    Pneumonia 08/2022   Trigeminal neuralgia    Past Surgical History:  Procedure Laterality Date   ATRIAL FIBRILLATION ABLATION N/A 05/24/2023   Procedure: ATRIAL FIBRILLATION ABLATION;  Surgeon: Nancey Eulas BRAVO, MD;  Location: [facility];  Service: Cardiovascular;  Laterality: N/A;   CATARACT EXTRACTION W/ INTRAOCULAR LENS IMPLANT Bilateral    CLIPPING OF ATRIAL APPENDAGE Left 12/08/2022   Procedure: CLIPPING OF ATRIAL APPENDAGE (LAA) USING 50 MM ATRICLIP;  Surgeon: Maryjane Mt, MD;  Location: [facility] OR;  Service: Open Heart Surgery;  Laterality: Left;   INGUINAL HERNIA REPAIR  11/07/2011   Procedure: LAPAROSCOPIC INGUINAL HERNIA;  Surgeon: Camellia CHRISTELLA Blush, MD,FACS;  Location: [facility] ORS;  Service: General;  Laterality: Left;   MAZE N/A 12/08/2022   Procedure: MAZE;  Surgeon: Maryjane Mt, MD;  Location: [facility] OR;  Service: Open Heart Surgery;  Laterality: N/A;   MITRAL VALVE REPAIR N/A 12/08/2022   Procedure: MITRAL VALVE REPAIR (MVR) USING 34 MM SIMULUS SEMI- RIGID ANNULOPLASTY BAND;  Surgeon: Maryjane Mt, MD;  Location: [facility] OR;  Service: Open Heart Surgery;  Laterality: N/A;   RETINAL DETACHMENT SURGERY  03/07/1995   RIGHT/LEFT HEART CATH AND CORONARY ANGIOGRAPHY N/A 10/12/2022   Procedure: RIGHT/LEFT HEART CATH AND CORONARY ANGIOGRAPHY;  Surgeon: Wendel Lurena POUR, MD;  Location: [facility];  Service: Cardiovascular;  Laterality: N/A;   SKIN CANCER EXCISION   2012, 1999   basal cell - neck and hand   TEE WITHOUT CARDIOVERSION N/A 10/16/2022   Procedure: TRANSESOPHAGEAL ECHOCARDIOGRAM;  Surgeon: Santo Stanly LABOR, MD;  Location: [facility];  Service: Cardiovascular;  Laterality: N/A;   TEE WITHOUT CARDIOVERSION N/A 12/08/2022   Procedure: TRANSESOPHAGEAL ECHOCARDIOGRAM;  Surgeon: Maryjane Mt, MD;  Location: [facility] OR;  Service: Open Heart Surgery;  Laterality: N/A;   TOTAL HIP ARTHROPLASTY Left 09/05/2023   Procedure: ARTHROPLASTY, HIP, TOTAL, ANTERIOR APPROACH;  Surgeon: Jerri Kay CHRISTELLA, MD;  Location: [facility] OR;  Service: Orthopedics;  Laterality: Left;   Patient Active Problem List   Diagnosis Date Noted   Hypokalemia 02/04/2024   Acute encephalopathy 02/03/2024   Gait abnormality 01/14/2024   Chronic low back pain without sciatica 01/14/2024   Malnutrition of moderate degree 09/05/2023   Subcapital fracture of neck of left femur, closed, initial encounter (HCC) 09/04/2023   Fall at home, initial encounter 09/04/2023   Left ankle injury, initial encounter 07/07/2023   Acute left ankle pain 07/07/2023   History of essential hypertension 07/07/2023   Hypercoagulable state due to paroxysmal atrial fibrillation (HCC) 06/21/2023   S/P MVR (mitral valve repair) 12/08/2022   Protein-calorie malnutrition, severe 08/19/2022   Alcohol withdrawal (HCC) 08/14/2022   Acute metabolic encephalopathy 08/14/2022   Paroxysmal atrial fibrillation (HCC) 08/14/2022   Nonrheumatic mitral valve regurgitation 08/14/2022   Mitral valve prolapse 08/14/2022   PNA (pneumonia) 08/12/2022   Severe  sepsis (HCC) 08/12/2022   HLD (hyperlipidemia) 08/12/2022   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   Trigeminal neuralgia of left side of face 07/15/2019   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  Rationale for Evaluation and Treatment: Rehabilitation  SUBJECTIVE:                                                                                                                                                                                             SUBJECTIVE STATEMENT: Not up to too much   Pt accompanied by: self  PERTINENT HISTORY: A-fib, HLD, HTN, trigeminal neuralgia, mitral valve repair 2024, L THA Hospitalized 02/03/2024-02/05/24 with AMS, found to have acute encephalopathy.   PAIN:  Are you having pain? Yes: NPRS scale: 0/10 Pain location: generalized Pain description: ache Aggravating factors: increased activity  Relieving factors: nothing  PRECAUTIONS: Fall  RED FLAGS: None   WEIGHT BEARING RESTRICTIONS: No  FALLS: Has patient fallen in last 6 months? No  LIVING ENVIRONMENT: Lives with: lives with their spouse Lives in: House/apartment Stairs: 7-8 steps to enter with rail; bedroom on 1st floor Has following equipment at home: Single point cane, Environmental Consultant - 4 wheeled, Tour manager, and Grab bars  PLOF: Independent, Vocation/Vocational requirements: retired, and Leisure: guitar and walking but has stopped these  PATIENT GOALS: improve balance  OBJECTIVE:     TODAY'S TREATMENT: 03/31/24 Activity Comments                        TODAY'S TREATMENT: 03/26/24 Activity Comments  STS w/ alt stair tap 2x10 Green ball, 6 step;  difficulty with SLS control  Standing balance activities -Standing on compliant surface reaching laterally for weighted balls and throwing to target for postural perturbation/anticipatory reactions -step and slide cones for SLS -lateral/diagonal steps over obstacles  Gait training Supervision w/ trekking poles level and outdoor surfaces with cues in technique  and increasing stride length              TODAY'S TREATMENT: 03/24/24 Activity Comments  STS with green medball 2x10 Good technique and effort for eccentric control. Difficulty completing last few reps on 2nd set d/t fatigue   4 square step with cane Required heavy cueing for safe foot and cane placement and CGA-min A. Backwards steps  required B UE support on chair   toe tap to 3 cones  Weaned to 2 fingertip support  Toe taps to 1 cone Frequent min A required to recover imbalance and cues to contract core to avoid trunk instability  Alt fwd stepping  1 UE support and CGA-min A     PATIENT EDUCATION: Education details: encouraged use of cane  Person educated: Patient Education method: Explanation Education comprehension: verbalized understanding   Access Code: [email or web address] URL: [email or web address] Date: 03/17/2024 Prepared by: [facility] - Outpatient  Rehab - [facility]  Exercises - Sit to Stand Without Arm Support  - 1 x daily - 5 x weekly - 2 sets - 10 reps - Heel Toe Raises with Counter Support  - 1 x daily - 5 x weekly - 2 sets - 10 reps - Mini Squat with Counter Support  - 1 x daily - 5 x weekly - 2 sets - 10 reps - Standing Gastroc Stretch at Counter  - 1 x daily - 5 x weekly - 2 sets - 30 sec hold - Standing Foot Lift on Box (BKA)  - 5 x weekly - 2-3 sets - 10 reps - Alternating Heel Raises  - 1 x daily - 5 x weekly - 2 sets - 10 reps - Staggered Stance Forward Backward Weight Shift with Counter Support  - 1 x daily - 5 x weekly - 2 sets - 10 reps    Note: Objective measures were completed at Evaluation unless otherwise noted.  DIAGNOSTIC FINDINGS: 01/29/24  MRI scan of the lumbar spine showing mild disc and prominent facet degenerative changes most prominent at L4-5 where there is mild right-sided foraminal narrowing.  02/04/24 brain MRI: Interval resolution of restricted diffusion within the left hippocampus. 2. Moderate diffuse cerebral volume  loss. 3. 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  COGNITION: Overall cognitive status: Within functional limits for tasks assessed   SENSATION: Intact in B LEs to light touch/pressure  COORDINATION: Alternating pronation/supination: WNL B Alternating toe tap: WNL B Finger to nose: slight dysmetria and intention tremor B   MUSCLE TONE: WNL B LEs  POSTURE: rounded shoulders and forward head  LOWER EXTREMITY ROM:     Active  Right Eval Left Eval  Hip flexion    Hip extension    Hip abduction    Hip adduction    Hip internal rotation    Hip external rotation    Knee flexion    Knee extension    Ankle dorsiflexion 6 4  Ankle plantarflexion    Ankle inversion    Ankle eversion     (Blank rows = not tested)  LOWER EXTREMITY MMT:    MMT (in sitting) Right Eval Left Eval  Hip flexion 3+ 4  Hip extension    Hip abduction 4+ 4  Hip adduction 4 4  Hip internal rotation    Hip external rotation    Knee flexion 4+ 4  Knee extension 4+ 4-  Ankle dorsiflexion 3+ 3+  Ankle plantarflexion 2+ Unable to complete 1 full rep in SLS 2+ Unable to complete 1 full rep in SLS  Ankle inversion    Ankle eversion    (Blank rows = not tested)   GAIT: Findings: Assistive device utilized:None, Level of assistance: Modified independence and SBA, and Comments: B reduced foot clearance and feet scuffing, slight imbalance, and slight posterior trunk lean  FUNCTIONAL TESTS:  5 times sit to stand: 13.37 sec with B UEs  10  meter walk test: 11.87 sec (2.76 ft/sec) Patient unable to stand without UE support                                                                                                                               TREATMENT DATE: 02/22/24    PATIENT EDUCATION: Education details: prognosis, POC, edu on exam findings and relevance to pt's functional impairments, HEP Person educated: Patient Education method: Explanation, Demonstration,  Tactile cues, Verbal cues, and Handouts Education comprehension: verbalized understanding and returned demonstration  HOME EXERCISE PROGRAM: Access Code: [email or web address] URL: [email or web address] Date: 02/22/2024 Prepared by: [facility] - Outpatient  Rehab - [facility]  Exercises - Standing Gastroc Stretch at Counter  - 1 x daily - 5 x weekly - 2 sets - 30 sec hold - Heel Toe Raises with Counter Support  - 1 x daily - 5 x weekly - 2 sets - 10 reps - Mini Squat with Counter Support  - 1 x daily - 5 x weekly - 2 sets - 10 reps   GOALS: Goals reviewed with patient? Yes  SHORT TERM GOALS: Target date: 03/14/2024  Patient to be independent with initial HEP. Baseline: HEP initiated Goal status: MET    LONG TERM GOALS: Target date: 04/04/2024  Patient to be independent with advanced HEP. Baseline: Not yet initiated  Goal status: IN PROGRESS  Patient to score at least 20/24 on DGI in order to decrease risk of falls.  Baseline: 14/24 Goal status: IN PROGRESS  Patient to demonstrate gait speed of at least 3.0 ft/sec in order to improve access to community.  Baseline: 2.76 ft/sec Goal status: IN PROGRESS  Patient to demonstrate STS without UE support. Baseline: unable Goal status: IN PROGRESS  Patient to report plans to participate in home or community exercise regimen to maintain fitness.  Baseline: not performing Goal status: IN PROGRESS  ASSESSMENT:  CLINICAL IMPRESSION: Activities to promote single limb stance control to improve balance, mobilty and safety with obstacles with considerable diffculty under single limb stance demands requiring UE support 75% of the time with unsteadiness throughout.  Righting/anticipatory responses present but delayed requiring CGA-min A for LOB correction.  Instructed in use of trekking poles to improve stride length and stability with ambulation on uneven surfaces w/ good carryover and markedly improved foot clearance w/ use of  device vs without.  Continued sessions to progress POC details to improve mobility and reduce risk for falls  OBJECTIVE IMPAIRMENTS: Abnormal gait, decreased activity tolerance, decreased balance, decreased ROM, decreased strength, and postural dysfunction.   ACTIVITY LIMITATIONS: carrying, lifting, bending, standing, squatting, stairs, transfers, bathing, dressing, and locomotion level  PARTICIPATION LIMITATIONS: meal prep, cleaning, laundry, shopping, community activity, and church  PERSONAL FACTORS: Age, Fitness, Past/current experiences, Time since onset of injury/illness/exacerbation, and 3+ comorbidities: A-fib, HLD, HTN, trigeminal neuralgia, mitral valve repair 2024, L THA are also affecting patient's functional outcome.   REHAB POTENTIAL: Good  CLINICAL DECISION MAKING: Evolving/moderate  complexity  EVALUATION COMPLEXITY: Moderate  PLAN:  PT FREQUENCY: 2x/week  PT DURATION: 6 weeks  PLANNED INTERVENTIONS: [id number]- PT Re-evaluation, 97110-Therapeutic exercises, 97530- Therapeutic activity, [id number]- Neuromuscular re-education, 97535- Self Care, [id number]- Manual therapy, [id number]- Gait training, [phone]- Canalith repositioning, [id number]- Electrical stimulation (manual), [phone] (1-2 muscles), [id number] (3+ muscles)- Dry Needling, Patient/Family education, Balance training, Stair training, Taping, Joint mobilization, Spinal mobilization, Vestibular training, Cryotherapy, and Moist heat  PLAN FOR NEXT SESSION: Continue progress for BLE strength, ankle control and strategy, STS transfers; obstacle negotiation as pt just got a new puppy. Gait with focus on step length, heelstrike, arm swing  10:16 AM, 03/28/24 M. Kelly Halpin, PT, DPT Physical Therapist- [place] Office Number: [phone]     "

## 2024-03-31 ENCOUNTER — Ambulatory Visit: Admitting: Physical Therapy

## 2024-04-02 ENCOUNTER — Ambulatory Visit

## 2024-04-02 DIAGNOSIS — R2681 Unsteadiness on feet: Secondary | ICD-10-CM

## 2024-04-02 DIAGNOSIS — R293 Abnormal posture: Secondary | ICD-10-CM

## 2024-04-02 DIAGNOSIS — M6281 Muscle weakness (generalized): Secondary | ICD-10-CM

## 2024-04-02 DIAGNOSIS — R2689 Other abnormalities of gait and mobility: Secondary | ICD-10-CM

## 2024-04-02 NOTE — Therapy (Signed)
 " OUTPATIENT PHYSICAL THERAPY NEURO TREATMENT and D/C Summary   Patient Name: James Ibarra MRN: [id number] DOB:Oct 26, 1950, 74 y.o., male Today's Date: 04/02/2024   PCP:  Kip Righter, MD     REFERRING PROVIDER: Onita Duos, MD  PHYSICAL THERAPY DISCHARGE SUMMARY  Visits from Start of Care: 8  Current functional level related to goals / functional outcomes: Goals met   Remaining deficits:'left ankle DF weakness.   Education / Equipment: HEP and DF-assist device   Patient agrees to discharge. Patient goals were met. Patient is being discharged due to meeting the stated rehab goals.  END OF SESSION:  PT End of Session - 04/02/24 1138     Visit Number 8    Number of Visits 13    Date for Recertification  04/04/24    Authorization Type [employer] MCR    Authorization Time Period approved 13 PT visits from 02/22/24-04/11/24    Authorization - Visit Number 8    Authorization - Number of Visits 13    PT Start Time 1145    PT Stop Time 1230    PT Time Calculation (min) 45 min    Equipment Utilized During Treatment Gait belt    Activity Tolerance Patient tolerated treatment well    Behavior During Therapy WFL for tasks assessed/performed              Past Medical History:  Diagnosis Date   Atrial fibrillation (HCC)    Cancer (HCC)    Skin Cancer - Scalp, Right neck, Left Hand   Dysrhythmia    A. Fib while hospitalized June 2024   Heart murmur    History of nonmelanoma skin cancer    Hypercholesteremia    Hypertension    Pneumonia 08/2022   Trigeminal neuralgia    Past Surgical History:  Procedure Laterality Date   ATRIAL FIBRILLATION ABLATION N/A 05/24/2023   Procedure: ATRIAL FIBRILLATION ABLATION;  Surgeon: Nancey Eulas BRAVO, MD;  Location: [facility];  Service: Cardiovascular;  Laterality: N/A;   CATARACT EXTRACTION W/ INTRAOCULAR LENS IMPLANT Bilateral    CLIPPING OF ATRIAL APPENDAGE Left 12/08/2022   Procedure: CLIPPING OF ATRIAL APPENDAGE (LAA)  USING 50 MM ATRICLIP;  Surgeon: Maryjane Mt, MD;  Location: [facility] OR;  Service: Open Heart Surgery;  Laterality: Left;   INGUINAL HERNIA REPAIR  11/07/2011   Procedure: LAPAROSCOPIC INGUINAL HERNIA;  Surgeon: Camellia CHRISTELLA Blush, MD,FACS;  Location: [facility];  Service: General;  Laterality: Left;   MAZE N/A 12/08/2022   Procedure: MAZE;  Surgeon: Maryjane Mt, MD;  Location: [facility] OR;  Service: Open Heart Surgery;  Laterality: N/A;   MITRAL VALVE REPAIR N/A 12/08/2022   Procedure: MITRAL VALVE REPAIR (MVR) USING 34 MM SIMULUS SEMI- RIGID ANNULOPLASTY BAND;  Surgeon: Maryjane Mt, MD;  Location: [facility] OR;  Service: Open Heart Surgery;  Laterality: N/A;   RETINAL DETACHMENT SURGERY  03/07/1995   RIGHT/LEFT HEART CATH AND CORONARY ANGIOGRAPHY N/A 10/12/2022   Procedure: RIGHT/LEFT HEART CATH AND CORONARY ANGIOGRAPHY;  Surgeon: Wendel Lurena POUR, MD;  Location: [facility];  Service: Cardiovascular;  Laterality: N/A;   SKIN CANCER EXCISION  2012, 1999   basal cell - neck and hand   TEE WITHOUT CARDIOVERSION N/A 10/16/2022   Procedure: TRANSESOPHAGEAL ECHOCARDIOGRAM;  Surgeon: Santo Stanly LABOR, MD;  Location: [facility];  Service: Cardiovascular;  Laterality: N/A;   TEE WITHOUT CARDIOVERSION N/A 12/08/2022   Procedure: TRANSESOPHAGEAL ECHOCARDIOGRAM;  Surgeon: Maryjane Mt, MD;  Location: [facility] OR;  Service:  Open Heart Surgery;  Laterality: N/A;   TOTAL HIP ARTHROPLASTY Left 09/05/2023   Procedure: ARTHROPLASTY, HIP, TOTAL, ANTERIOR APPROACH;  Surgeon: Jerri Kay HERO, MD;  Location: [facility] OR;  Service: Orthopedics;  Laterality: Left;   Patient Active Problem List   Diagnosis Date Noted   Hypokalemia 02/04/2024   Acute encephalopathy 02/03/2024   Gait abnormality 01/14/2024   Chronic low back pain without sciatica 01/14/2024   Malnutrition of moderate degree 09/05/2023   Subcapital fracture of neck of left femur, closed, initial encounter (HCC) 09/04/2023   Fall at home, initial encounter  09/04/2023   Left ankle injury, initial encounter 07/07/2023   Acute left ankle pain 07/07/2023   History of essential hypertension 07/07/2023   Hypercoagulable state due to paroxysmal atrial fibrillation (HCC) 06/21/2023   S/P MVR (mitral valve repair) 12/08/2022   Protein-calorie malnutrition, severe 08/19/2022   Alcohol withdrawal (HCC) 08/14/2022   Acute metabolic encephalopathy 08/14/2022   Paroxysmal atrial fibrillation (HCC) 08/14/2022   Nonrheumatic mitral valve regurgitation 08/14/2022   Mitral valve prolapse 08/14/2022   PNA (pneumonia) 08/12/2022   Severe sepsis (HCC) 08/12/2022   HLD (hyperlipidemia) 08/12/2022   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   Trigeminal neuralgia of left side of face 07/15/2019   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   Hypertension 10/31/2013   Pain in joint, shoulder region 04/14/2013   Nodule of right lung 11/07/2011    ONSET DATE: months  REFERRING DIAG: R26.9 (ICD-10-CM) - Gait abnormality M54.50,G89.29 (ICD-10-CM) - Chronic low back pain without sciatica, unspecified back pain laterality  THERAPY DIAG:  Unsteadiness on feet  Muscle weakness (generalized)  Other abnormalities of gait and mobility  Abnormal posture  Balance problem  Rationale for Evaluation and Treatment: Rehabilitation  SUBJECTIVE:                                                                                                                                                                                             SUBJECTIVE STATEMENT: Doing ok, haven't practiced my balance like I should   Pt accompanied by: self  PERTINENT HISTORY: A-fib, HLD, HTN, trigeminal neuralgia, mitral valve repair 2024, L THA Hospitalized 02/03/2024-02/05/24 with AMS, found to have acute encephalopathy.   PAIN:  Are you having pain? Yes: NPRS scale: 0/10 Pain location: generalized Pain description: ache Aggravating factors: increased activity  Relieving  factors: nothing  PRECAUTIONS: Fall  RED FLAGS: None   WEIGHT BEARING RESTRICTIONS: No  FALLS: Has patient fallen in last 6 months? No  LIVING ENVIRONMENT: Lives with: lives with their spouse Lives in: House/apartment Stairs: 7-8 steps to enter with rail;  bedroom on 1st floor Has following equipment at home: Single point cane, Walker - 4 wheeled, Tour manager, and Grab bars  PLOF: Independent, Vocation/Vocational requirements: retired, and Leisure: guitar and walking but has stopped these  PATIENT GOALS: improve balance  OBJECTIVE:   TODAY'S TREATMENT: 04/02/24 Activity Comments  Transfers independent    Dynamic Gait Index 21/24  HEP review   Trials w/ foot-up due to left DF weakness of 3/5            TODAY'S TREATMENT: 03/26/24 Activity Comments  STS w/ alt stair tap 2x10 Green ball, 6 step;  difficulty with SLS control  Standing balance activities -Standing on compliant surface reaching laterally for weighted balls and throwing to target for postural perturbation/anticipatory reactions -step and slide cones for SLS -lateral/diagonal steps over obstacles  Gait training Supervision w/ trekking poles level and outdoor surfaces with cues in technique and increasing stride length              TODAY'S TREATMENT: 03/24/24 Activity Comments  STS with green medball 2x10 Good technique and effort for eccentric control. Difficulty completing last few reps on 2nd set d/t fatigue   4 square step with cane Required heavy cueing for safe foot and cane placement and CGA-min A. Backwards steps required B UE support on chair   toe tap to 3 cones  Weaned to 2 fingertip support  Toe taps to 1 cone Frequent min A required to recover imbalance and cues to contract core to avoid trunk instability  Alt fwd stepping  1 UE support and CGA-min A     PATIENT EDUCATION: Education details: encouraged use of cane  Person educated: Patient Education method: Explanation Education  comprehension: verbalized understanding   Access Code: [email or web address] URL: [email or web address] Date: 03/17/2024 Prepared by: [facility] - Outpatient  Rehab - [facility]  Exercises - Sit to Stand Without Arm Support  - 1 x daily - 5 x weekly - 2 sets - 10 reps - Heel Toe Raises with Counter Support  - 1 x daily - 5 x weekly - 2 sets - 10 reps - Mini Squat with Counter Support  - 1 x daily - 5 x weekly - 2 sets - 10 reps - Standing Gastroc Stretch at Counter  - 1 x daily - 5 x weekly - 2 sets - 30 sec hold - Standing Foot Lift on Box (BKA)  - 5 x weekly - 2-3 sets - 10 reps - Alternating Heel Raises  - 1 x daily - 5 x weekly - 2 sets - 10 reps - Staggered Stance Forward Backward Weight Shift with Counter Support  - 1 x daily - 5 x weekly - 2 sets - 10 reps    Note: Objective measures were completed at Evaluation unless otherwise noted.  DIAGNOSTIC FINDINGS: 01/29/24  MRI scan of the lumbar spine showing mild disc and prominent facet degenerative changes most prominent at L4-5 where there is mild right-sided foraminal narrowing.  02/04/24 brain MRI: Interval resolution of restricted diffusion within the left hippocampus. 2. Moderate diffuse cerebral volume loss. 3. 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  COGNITION: Overall cognitive status: Within functional limits for tasks assessed   SENSATION: Intact in B LEs to light touch/pressure  COORDINATION: Alternating pronation/supination: WNL B Alternating toe tap: WNL B Finger to nose: slight dysmetria and intention tremor B   MUSCLE TONE: WNL B LEs  POSTURE: rounded shoulders and forward head  LOWER EXTREMITY ROM:  Active  Right Eval Left Eval  Hip flexion    Hip extension    Hip abduction    Hip adduction    Hip internal rotation    Hip external rotation    Knee flexion    Knee extension    Ankle dorsiflexion 6 4  Ankle plantarflexion    Ankle inversion    Ankle  eversion     (Blank rows = not tested)  LOWER EXTREMITY MMT:    MMT (in sitting) Right Eval Left Eval  Hip flexion 3+ 4  Hip extension    Hip abduction 4+ 4  Hip adduction 4 4  Hip internal rotation    Hip external rotation    Knee flexion 4+ 4  Knee extension 4+ 4-  Ankle dorsiflexion 3+ 3+  Ankle plantarflexion 2+ Unable to complete 1 full rep in SLS 2+ Unable to complete 1 full rep in SLS  Ankle inversion    Ankle eversion    (Blank rows = not tested)   GAIT: Findings: Assistive device utilized:None, Level of assistance: Modified independence and SBA, and Comments: B reduced foot clearance and feet scuffing, slight imbalance, and slight posterior trunk lean  FUNCTIONAL TESTS:  5 times sit to stand: 13.37 sec with B UEs  10 meter walk test: 11.87 sec (2.76 ft/sec) Patient unable to stand without UE support                                                                                                                               TREATMENT DATE: 02/22/24    PATIENT EDUCATION: Education details: prognosis, POC, edu on exam findings and relevance to pt's functional impairments, HEP Person educated: Patient Education method: Explanation, Demonstration, Tactile cues, Verbal cues, and Handouts Education comprehension: verbalized understanding and returned demonstration  HOME EXERCISE PROGRAM: Access Code: [email or web address] URL: [email or web address] Date: 02/22/2024 Prepared by: [facility] - Outpatient  Rehab - [facility]  Exercises - Standing Gastroc Stretch at Counter  - 1 x daily - 5 x weekly - 2 sets - 30 sec hold - Heel Toe Raises with Counter Support  - 1 x daily - 5 x weekly - 2 sets - 10 reps - Mini Squat with Counter Support  - 1 x daily - 5 x weekly - 2 sets - 10 reps   GOALS: Goals reviewed with patient? Yes  SHORT TERM GOALS: Target date: 03/14/2024  Patient to be independent with initial HEP. Baseline: HEP initiated Goal status:  MET    LONG TERM GOALS: Target date: 04/04/2024  Patient to be independent with advanced HEP. Baseline: Not yet initiated  Goal status: MET  Patient to score at least 20/24 on DGI in order to decrease risk of falls.  Baseline: 14/24; 21/24 Goal status: MET  Patient to demonstrate gait speed of at least 3.0 ft/sec in order to improve access to community.  Baseline: 2.76 ft/sec; 3.0 ft/sec Goal status: MET  Patient to  demonstrate STS without UE support. Baseline: unable; independent Goal status: MET  Patient to report plans to participate in home or community exercise regimen to maintain fitness.  Baseline: not performing; reports HEP about 1-2x/wk  Goal status: MET  ASSESSMENT:  CLINICAL IMPRESSION: Improved BLE strength as evident by ability to perform sit to stand transfer w/out UE support and for repetition. Dynamic Gait Index w/ marked improvement from baseline 14/24 to 21/24 indicating from high to low risk for falls. Exhibits ongoing left ankle dorsiflexion weakness and reports remote hx of foot drop.  Demo and trial of foot-up device for left ankle DF with improev foot clearance and heel strike initial contact. Good recall to HEP and provided additional copies and info regarding foot-up.  Pt reports confidence in self-management at this time and will D/C to HEP  OBJECTIVE IMPAIRMENTS: Abnormal gait, decreased activity tolerance, decreased balance, decreased ROM, decreased strength, and postural dysfunction.   ACTIVITY LIMITATIONS: carrying, lifting, bending, standing, squatting, stairs, transfers, bathing, dressing, and locomotion level  PARTICIPATION LIMITATIONS: meal prep, cleaning, laundry, shopping, community activity, and church  PERSONAL FACTORS: Age, Fitness, Past/current experiences, Time since onset of injury/illness/exacerbation, and 3+ comorbidities: A-fib, HLD, HTN, trigeminal neuralgia, mitral valve repair 2024, L THA are also affecting patient's functional  outcome.   REHAB POTENTIAL: Good  CLINICAL DECISION MAKING: Evolving/moderate complexity  EVALUATION COMPLEXITY: Moderate  PLAN:  PT FREQUENCY: 2x/week  PT DURATION: 6 weeks  PLANNED INTERVENTIONS: [id number]- PT Re-evaluation, 97110-Therapeutic exercises, 97530- Therapeutic activity, [id number]- Neuromuscular re-education, 97535- Self Care, [id number]- Manual therapy, [id number]- Gait training, [phone]- Canalith repositioning, [id number]- Electrical stimulation (manual), 20560 (1-2 muscles), [id number] (3+ muscles)- Dry Needling, Patient/Family education, Balance training, Stair training, Taping, Joint mobilization, Spinal mobilization, Vestibular training, Cryotherapy, and Moist heat  PLAN FOR NEXT SESSION: D/C to HEP 11:39 AM, 04/02/24 M. Kelly Charon Akamine, PT, DPT Physical Therapist- [place] Office Number: [phone]     "

## 2024-05-02 ENCOUNTER — Ambulatory Visit: Admitting: General Practice

## 2024-05-07 ENCOUNTER — Encounter: Admitting: Neurology

## 2024-08-12 ENCOUNTER — Ambulatory Visit: Admitting: Adult Health
